# Patient Record
Sex: Female | Born: 1940 | Race: White | Hispanic: No | Marital: Married | Smoking: Former smoker
Health system: Southern US, Community
[De-identification: ages and names within clinical notes are randomized; demographics above are authoritative.]

## PROBLEM LIST (undated history)

## (undated) DIAGNOSIS — R609 Edema, unspecified: Secondary | ICD-10-CM

## (undated) DIAGNOSIS — N183 Chronic kidney disease, stage 3 unspecified: Secondary | ICD-10-CM

## (undated) DIAGNOSIS — I471 Supraventricular tachycardia, unspecified: Secondary | ICD-10-CM

## (undated) DIAGNOSIS — G4733 Obstructive sleep apnea (adult) (pediatric): Secondary | ICD-10-CM

## (undated) DIAGNOSIS — Z01810 Encounter for preprocedural cardiovascular examination: Secondary | ICD-10-CM

## (undated) DIAGNOSIS — I129 Hypertensive chronic kidney disease with stage 1 through stage 4 chronic kidney disease, or unspecified chronic kidney disease: Secondary | ICD-10-CM

## (undated) DIAGNOSIS — E669 Obesity, unspecified: Secondary | ICD-10-CM

## (undated) DIAGNOSIS — K529 Noninfective gastroenteritis and colitis, unspecified: Secondary | ICD-10-CM

## (undated) DIAGNOSIS — I10 Essential (primary) hypertension: Secondary | ICD-10-CM

## (undated) DIAGNOSIS — G4730 Sleep apnea, unspecified: Secondary | ICD-10-CM

## (undated) DIAGNOSIS — K219 Gastro-esophageal reflux disease without esophagitis: Secondary | ICD-10-CM

## (undated) DIAGNOSIS — R9430 Abnormal result of cardiovascular function study, unspecified: Secondary | ICD-10-CM

## (undated) DIAGNOSIS — E079 Disorder of thyroid, unspecified: Secondary | ICD-10-CM

## (undated) DIAGNOSIS — I519 Heart disease, unspecified: Secondary | ICD-10-CM

## (undated) HISTORY — DX: Obesity, unspecified: E66.9

## (undated) HISTORY — DX: Obstructive sleep apnea (adult) (pediatric): G47.33

## (undated) HISTORY — DX: Noninfective gastroenteritis and colitis, unspecified: K52.9

## (undated) HISTORY — DX: Heart disease, unspecified: I51.9

## (undated) HISTORY — DX: Chronic kidney disease, stage 3 (moderate): N18.3

## (undated) HISTORY — DX: Gastro-esophageal reflux disease without esophagitis: K21.9

## (undated) HISTORY — DX: Supraventricular tachycardia, unspecified: I47.10

## (undated) HISTORY — DX: Chronic kidney disease, stage 3 unspecified: N18.30

## (undated) HISTORY — DX: Encounter for preprocedural cardiovascular examination: Z01.810

## (undated) HISTORY — DX: Edema, unspecified: R60.9

## (undated) HISTORY — DX: Essential (primary) hypertension: I10

## (undated) HISTORY — DX: Sleep apnea, unspecified: G47.30

## (undated) HISTORY — DX: Hypertensive chronic kidney disease with stage 1 through stage 4 chronic kidney disease, or unspecified chronic kidney disease: I12.9

## (undated) HISTORY — DX: Abnormal result of cardiovascular function study, unspecified: R94.30

## (undated) HISTORY — DX: Supraventricular tachycardia: I47.1

---

## 1992-04-25 HISTORY — PX: CHOLECYSTECTOMY: SHX55

## 1998-02-20 ENCOUNTER — Other Ambulatory Visit: Admission: RE | Admit: 1998-02-20 | Discharge: 1998-02-20 | Payer: Self-pay | Admitting: Gastroenterology

## 1999-03-02 ENCOUNTER — Other Ambulatory Visit: Admission: RE | Admit: 1999-03-02 | Discharge: 1999-03-02 | Payer: Self-pay | Admitting: Obstetrics and Gynecology

## 2000-03-14 ENCOUNTER — Other Ambulatory Visit: Admission: RE | Admit: 2000-03-14 | Discharge: 2000-03-14 | Payer: Self-pay | Admitting: *Deleted

## 2001-02-21 ENCOUNTER — Other Ambulatory Visit
Admission: RE | Admit: 2001-02-21 | Discharge: 2001-02-21 | Payer: Self-pay | Admitting: Physical Medicine & Rehabilitation

## 2001-10-01 ENCOUNTER — Ambulatory Visit (HOSPITAL_COMMUNITY): Admission: RE | Admit: 2001-10-01 | Discharge: 2001-10-01 | Payer: Self-pay | Admitting: Gastroenterology

## 2002-02-26 ENCOUNTER — Other Ambulatory Visit: Admission: RE | Admit: 2002-02-26 | Discharge: 2002-02-26 | Payer: Self-pay | Admitting: Obstetrics and Gynecology

## 2002-04-04 ENCOUNTER — Emergency Department (HOSPITAL_COMMUNITY): Admission: EM | Admit: 2002-04-04 | Discharge: 2002-04-05 | Payer: Self-pay | Admitting: Emergency Medicine

## 2002-04-25 DIAGNOSIS — IMO0002 Reserved for concepts with insufficient information to code with codable children: Secondary | ICD-10-CM

## 2002-04-25 DIAGNOSIS — R9430 Abnormal result of cardiovascular function study, unspecified: Secondary | ICD-10-CM

## 2002-04-25 HISTORY — DX: Abnormal result of cardiovascular function study, unspecified: R94.30

## 2002-04-25 HISTORY — DX: Reserved for concepts with insufficient information to code with codable children: IMO0002

## 2003-03-06 ENCOUNTER — Other Ambulatory Visit: Admission: RE | Admit: 2003-03-06 | Discharge: 2003-03-06 | Payer: Self-pay | Admitting: Obstetrics and Gynecology

## 2003-06-04 ENCOUNTER — Encounter: Admission: RE | Admit: 2003-06-04 | Discharge: 2003-06-04 | Payer: Self-pay | Admitting: Obstetrics and Gynecology

## 2004-02-18 ENCOUNTER — Encounter: Admission: RE | Admit: 2004-02-18 | Discharge: 2004-03-17 | Payer: Self-pay | Admitting: Family Medicine

## 2004-03-24 ENCOUNTER — Other Ambulatory Visit: Admission: RE | Admit: 2004-03-24 | Discharge: 2004-03-24 | Payer: Self-pay | Admitting: Obstetrics and Gynecology

## 2004-12-13 ENCOUNTER — Ambulatory Visit: Payer: Self-pay | Admitting: Cardiology

## 2005-03-24 ENCOUNTER — Other Ambulatory Visit: Admission: RE | Admit: 2005-03-24 | Discharge: 2005-03-24 | Payer: Self-pay | Admitting: Obstetrics and Gynecology

## 2005-07-05 ENCOUNTER — Encounter: Admission: RE | Admit: 2005-07-05 | Discharge: 2005-07-05 | Payer: Self-pay | Admitting: Obstetrics and Gynecology

## 2005-10-19 ENCOUNTER — Encounter: Admission: RE | Admit: 2005-10-19 | Discharge: 2005-12-12 | Payer: Self-pay | Admitting: Obstetrics and Gynecology

## 2006-03-28 ENCOUNTER — Other Ambulatory Visit: Admission: RE | Admit: 2006-03-28 | Discharge: 2006-03-28 | Payer: Self-pay | Admitting: Obstetrics and Gynecology

## 2006-12-20 ENCOUNTER — Ambulatory Visit: Payer: Self-pay | Admitting: Cardiology

## 2007-04-03 ENCOUNTER — Other Ambulatory Visit: Admission: RE | Admit: 2007-04-03 | Discharge: 2007-04-03 | Payer: Self-pay | Admitting: Obstetrics and Gynecology

## 2007-07-12 ENCOUNTER — Encounter: Admission: RE | Admit: 2007-07-12 | Discharge: 2007-07-12 | Payer: Self-pay | Admitting: Family Medicine

## 2008-04-03 ENCOUNTER — Other Ambulatory Visit: Admission: RE | Admit: 2008-04-03 | Discharge: 2008-04-03 | Payer: Self-pay | Admitting: Obstetrics and Gynecology

## 2008-09-23 HISTORY — PX: EYE SURGERY: SHX253

## 2009-04-07 ENCOUNTER — Other Ambulatory Visit: Admission: RE | Admit: 2009-04-07 | Discharge: 2009-04-07 | Payer: Self-pay | Admitting: Obstetrics and Gynecology

## 2009-04-25 HISTORY — PX: OTHER SURGICAL HISTORY: SHX169

## 2009-08-24 ENCOUNTER — Encounter: Payer: Self-pay | Admitting: Cardiology

## 2009-08-27 ENCOUNTER — Encounter: Payer: Self-pay | Admitting: Cardiology

## 2009-08-28 ENCOUNTER — Ambulatory Visit: Payer: Self-pay | Admitting: Cardiology

## 2009-09-08 ENCOUNTER — Encounter: Payer: Self-pay | Admitting: Cardiology

## 2009-09-23 HISTORY — PX: TOTAL KNEE ARTHROPLASTY: SHX125

## 2009-10-15 ENCOUNTER — Encounter: Admission: RE | Admit: 2009-10-15 | Discharge: 2009-11-11 | Payer: Self-pay | Admitting: Sports Medicine

## 2009-11-23 HISTORY — PX: CATARACT EXTRACTION: SUR2

## 2009-12-31 ENCOUNTER — Inpatient Hospital Stay (HOSPITAL_COMMUNITY): Admission: RE | Admit: 2009-12-31 | Discharge: 2010-01-02 | Payer: Self-pay | Admitting: Neurological Surgery

## 2010-01-18 ENCOUNTER — Encounter (HOSPITAL_BASED_OUTPATIENT_CLINIC_OR_DEPARTMENT_OTHER): Admission: RE | Admit: 2010-01-18 | Discharge: 2010-03-12 | Payer: Self-pay | Admitting: Internal Medicine

## 2010-02-02 ENCOUNTER — Encounter: Admission: RE | Admit: 2010-02-02 | Discharge: 2010-02-02 | Payer: Self-pay | Admitting: Neurological Surgery

## 2010-03-25 HISTORY — PX: EYE SURGERY: SHX253

## 2010-04-27 ENCOUNTER — Encounter
Admission: RE | Admit: 2010-04-27 | Discharge: 2010-04-27 | Payer: Self-pay | Source: Home / Self Care | Attending: Neurological Surgery | Admitting: Neurological Surgery

## 2010-05-25 NOTE — Letter (Signed)
Summary: Surgical Clearance - L - Knee Replacement  Surgical Clearance - L - Knee Replacement   Imported By: Marylou Mccoy 10/15/2009 12:30:03  _____________________________________________________________________  External Attachment:    Type:   Image     Comment:   External Document

## 2010-05-25 NOTE — Assessment & Plan Note (Signed)
Summary: f2y/clearance for knee surgery/jml  Medications Added ALENDRONATE SODIUM 70 MG TABS (ALENDRONATE SODIUM) weekly VITAMIN D (ERGOCALCIFEROL) 50000 UNIT CAPS (ERGOCALCIFEROL) weekly LISINOPRIL 10 MG TABS (LISINOPRIL) Take one tablet by mouth daily DILTIAZEM HCL ER BEADS 180 MG XR24H-CAP (DILTIAZEM HCL ER BEADS) Take one capsule by mouth daily MULTIVITAMINS   TABS (MULTIPLE VITAMIN) once daily CALCIUM CARBONATE-VITAMIN D 600-400 MG-UNIT  TABS (CALCIUM CARBONATE-VITAMIN D) 2 tabs two times a day GLUCOSAMINE-CHONDROITIN   CAPS (GLUCOSAMINE-CHONDROIT-VIT C-MN) once daily OMEPRAZOLE 20 MG CPDR (OMEPRAZOLE) once daily FERROUS SULFATE 325 (65 FE) MG  TABS (FERROUS SULFATE) two times a day CELEBREX 200 MG CAPS (CELECOXIB) once daily TRAMADOL HCL 50 MG TABS (TRAMADOL HCL) 1-2 tabs every 4-6 hrs as needed      Allergies Added: NKDA  Visit Type:  cardiac clearance Referring Provider:  Alesia Morin, MD Primary Provider:  Selena Batten, MD  CC:  supraventricular tachycardia.  History of Present Illness: The patient is seen for followup of supraventricular tachycardia and for cardiac clearance for orthopedic surgery.  I saw her last in August, 2008.  There is a documented episode of supraventricular tachycardia in the past.  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  She has been on Cardizem and stable.  She has not had any recurring symptoms.  She did not have chest pain.  There is no coronary artery disease documented.  She has normal LV function by echo in the past.  Current Medications (verified): 1)  Alendronate Sodium 70 Mg Tabs (Alendronate Sodium) .... Weekly 2)  Vitamin D (Ergocalciferol) 50000 Unit Caps (Ergocalciferol) .... Weekly 3)  Lisinopril 10 Mg Tabs (Lisinopril) .... Take One Tablet By Mouth Daily 4)  Diltiazem Hcl Er Beads 180 Mg Xr24h-Cap (Diltiazem Hcl Er Beads) .... Take One Capsule By Mouth Daily 5)  Multivitamins   Tabs (Multiple Vitamin) .... Once Daily 6)  Calcium  Carbonate-Vitamin D 600-400 Mg-Unit  Tabs (Calcium Carbonate-Vitamin D) .... 2 Tabs Two Times A Day 7)  Glucosamine-Chondroitin   Caps (Glucosamine-Chondroit-Vit C-Mn) .... Once Daily 8)  Omeprazole 20 Mg Cpdr (Omeprazole) .... Once Daily 9)  Ferrous Sulfate 325 (65 Fe) Mg  Tabs (Ferrous Sulfate) .... Two Times A Day 10)  Celebrex 200 Mg Caps (Celecoxib) .... Once Daily 11)  Tramadol Hcl 50 Mg Tabs (Tramadol Hcl) .Marland Kitchen.. 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 echo... 2004  Review of Systems       Patient denies fever, chills, headache, sweats, rash, change in vision, change in hearing, chest pain, cough, nausea vomiting, urinary symptoms.  All other systems are negative.  She does have back and knee pain for which he needs surgery.  Vital Signs:  Patient profile:   70 year old female Height:      69 inches Weight:      282 pounds BMI:     41.79 Pulse rate:   70 / minute BP sitting:   136 / 74  (left arm) Cuff size:   regular  Vitals Entered By: Hardin Negus, RMA (Aug 28, 2009 1:59 PM)  Physical Exam  General:  patient is overweight but stable. Head:  head is atraumatic. Eyes:  no xanthelasma. Neck:  no jugular venous distention.  No carotid bruits. Chest Wall:  no chest  tenderness. Lungs:  lungs are clear respiratory effort is not labored. Heart:  cardiac exam reveals S1-S2.  No clicks or significant murmurs. Abdomen:  abdomen is soft. Msk:  no musculoskeletal deformities. Extremities:  no peripheral edema. Skin:  no skin rashes. Psych:  patient is oriented to person time and place.  Affect is normal.  She is here with her husband today.   Impression &  Recommendations:  Problem # 1:  HYPERTENSION (ICD-401.9)  Her updated medication list for this problem includes:    Lisinopril 10 Mg Tabs (Lisinopril) .Marland Kitchen... Take one tablet by mouth daily    Diltiazem Hcl Er Beads 180 Mg Xr24h-cap (Diltiazem hcl er beads) .Marland Kitchen... Take one capsule by mouth daily Blood pressure is controlled.  No change in therapy.  Problem # 2:  SUPRAVENTRICULAR TACHYCARDIA (ICD-427.89)  Her updated medication list for this problem includes:    Lisinopril 10 Mg Tabs (Lisinopril) .Marland Kitchen... Take one tablet by mouth daily    Diltiazem Hcl Er Beads 180 Mg Xr24h-cap (Diltiazem hcl er beads) .Marland Kitchen... Take one capsule by mouth daily There is history of supraventricular tachycardia.  The patient has not had symptoms and she's had no recurrent arrhythmias.  She continues on diltiazem.  We noted she has normal LV function.  Further cardiac testing is not needed.  EKG was not done today.  The patient has an EKG dated Aug 24, 2009.  I have a clear copy and I have reviewed it.  There is normal sinus rhythm with no significant abnormalities.  Problem # 3:  * SURGICAL CLEARANCE FOR LEFT KNEE REPLACEMENT The patient is stable from the cardiac viewpoint.  She is cleared to undergo orthopedic surgery.  No further cardiac workup is needed.  Patient Instructions: 1)  Your physician recommends that you schedule a follow-up appointment in: 2 years with Dr. Myrtis Ser 2)  Your physician recommends that you continue on your current medications as directed. Please refer to the Current Medication list given to you today.

## 2010-05-25 NOTE — Miscellaneous (Signed)
  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 stable with diltiazem GERD Hypertension Urinary incontinence Surgical clearance left knee replacement LV function  normal.... echo... 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 echo... 2004

## 2010-05-25 NOTE — Miscellaneous (Signed)
  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

## 2010-05-25 NOTE — Letter (Signed)
Summary: Varney Biles   Imported By: Roderic Ovens 09/10/2009 16:11:19  _____________________________________________________________________  External Attachment:    Type:   Image     Comment:   External Document

## 2010-06-24 HISTORY — PX: CATARACT EXTRACTION: SUR2

## 2010-07-08 LAB — SURGICAL PCR SCREEN: Staphylococcus aureus: NEGATIVE

## 2010-07-08 LAB — DIFFERENTIAL
Lymphocytes Relative: 26 % (ref 12–46)
Lymphs Abs: 2.2 10*3/uL (ref 0.7–4.0)
Monocytes Relative: 9 % (ref 3–12)
Neutro Abs: 5.2 10*3/uL (ref 1.7–7.7)
Neutrophils Relative %: 63 % (ref 43–77)

## 2010-07-08 LAB — COMPREHENSIVE METABOLIC PANEL
BUN: 10 mg/dL (ref 6–23)
Calcium: 9.6 mg/dL (ref 8.4–10.5)
Glucose, Bld: 92 mg/dL (ref 70–99)
Sodium: 138 mEq/L (ref 135–145)
Total Protein: 7.3 g/dL (ref 6.0–8.3)

## 2010-07-08 LAB — ABO/RH: ABO/RH(D): A POS

## 2010-07-08 LAB — CBC
HCT: 40.1 % (ref 36.0–46.0)
MCHC: 33.4 g/dL (ref 30.0–36.0)
MCV: 85 fL (ref 78.0–100.0)
RDW: 13.4 % (ref 11.5–15.5)

## 2010-07-08 LAB — TYPE AND SCREEN

## 2010-07-08 LAB — URINALYSIS, ROUTINE W REFLEX MICROSCOPIC
Nitrite: NEGATIVE
Specific Gravity, Urine: 1.007 (ref 1.005–1.030)
Urobilinogen, UA: 0.2 mg/dL (ref 0.0–1.0)

## 2010-07-08 LAB — PROTIME-INR: INR: 0.95 (ref 0.00–1.49)

## 2010-07-08 LAB — APTT: aPTT: 25 seconds (ref 24–37)

## 2010-09-07 NOTE — Assessment & Plan Note (Signed)
[facility]                            CARDIOLOGY OFFICE NOTE   NAME:[name], Madison LEKAS                        MRN:          [id number]  DATE:12/20/2006                            DOB:          1940/07/04    Madison [name] is doing well. In the past she had a documented episode of  rapid supraventricular tachycardia. It was most probably AV nodal  reentry. She has not had any recurrence. She is maintained on [place] and  does quite well. She has not had any significant palpitations.  Unfortunately she has continued to gain weight and we talked about  trying to lose some weight. She is careful to watch her caffeine intake  and she is careful about her medications. She is not having any chest  pain or shortness of breath.   PAST MEDICAL HISTORY:   ALLERGIES:  No known drug allergies.   MEDICATIONS:  1. Cartia 180.  2. Prilosec OTC.  3. Lisinopril.  4. Calcium.  5. Vitamins.  6. Fosamax.   OTHER MEDICAL PROBLEMS:  See the list below.   REVIEW OF SYSTEMS:  Other than some mild reflux her review of systems is  negative.   PHYSICAL EXAMINATION:  Blood pressure is 130/78 with a pulse of 70. Her  weight is 303 pounds. This has increased 23 pounds since 2 years ago  when I saw last.  Patient is oriented to person, time, and place. Affect  is normal.  HEENT: Reveals no xanthelasma. She has normal extraocular motion. There  are no carotid bruits. There is no jugular venous distension.  LUNGS: Clear. Respiratory effort is not labored.  CARDIAC EXAM: Reveals an S1 with an S2. There are no clicks or  significant murmurs.  ABDOMEN: Obese but soft. She has normal bowel sounds.  There is no peripheral edema.   EKG: Normal.   PROBLEMS:  Include:  1. History of cholecystectomy.  2. Gastroesophageal reflux disease.  3. Hypertension, treated.  4. History of some urinary incontinence.  5. Episode of documented rapid supraventricular tachycardia in the  remote past, thought most likely to be AV nodal reentry      tachycardia. She has had no significant recurrence. She is stable.      I would keep her on Cartia. She is quite stable. She asked me about      the recommendation to use 81 mg of aspirin daily. In her case I      feel that this is a very reasonable recommendation.   The patient is stable and she does not need formal cardiology follow up.  I will ask that Dr. Corliss Blacker and her team write her ongoing cardiac  medicines but I will be happy to see her anytime.     Luis Abed, MD, [facility]  Electronically Signed    JDK/MedQ  DD: 12/20/2006  DT: 12/20/2006  Job #: [id number]   cc:   Pam Drown, M.D.

## 2010-09-10 NOTE — Procedures (Signed)
[place]  Patient:    Madison [name], Madison [name] Visit Number: [id number] MRN: [id number]          Service Type: END Location: ENDO Attending Physician:  Rich Brave Dictated by:   Florencia Reasons, M.D. Proc. Date: 10/01/01 Admit Date:  10/01/2001   CC:         Charles A. Tenny Craw, M.D.   Procedure Report  PROCEDURE:  Colonoscopy.  SURGEON:  Florencia Reasons, M.D.  INDICATIONS:  A 70 year old female with left lower quadrant pain (which has resolved on Aciphex) and diarrhea several times a day, especially after meals, every since her gallbladder surgery several years ago.  FINDINGS:  Sigmoid diverticulosis.  DESCRIPTION OF PROCEDURE:  The nature, purpose, and risks of the procedure had been discussed with the patient who provided written consent.  Sedation is fentanyl 100 mcg and Versed 10 mg IV without arrhythmias or desaturation.  The Olympus adult video colonoscope was easily advanced to the cecum as identified by visualization of the appendiceal orifice and pull back was then performed. The quality of the prep was very good with just a little bit of stool film in the proximal colon, fairly well-removed by irrigation.  There was some sigmoid diverticulosis, mild to moderate in amount, but this was otherwise normal exam without evidence of polyps, cancer, colitis, or vascular malformations. Retroflexion in the rectum showed a hypertrophied anal papilla.  Reinspection of the rectosigmoid was unremarkable.  No biopsies were obtained (random rectosigmoid biopsies on a flexible sigmoidoscopy four years ago were normal).  The patient tolerated the procedure well and there were no apparent complications.  IMPRESSION: 1. History of left lower quadrant pain, now resolved on proton pump inhibitor    therapy.  Exact nature of pain unclear. 2. Sigmoid diverticulosis, felt not likely to be related to the patients    pain, given the character of  the pain, and its resolution on proton pump    inhibitor therapy. 3. Diarrhea after cholecystectomy.  PLAN:  Trial of Questran.  If the diarrhea persist, we could consider repeat random mucosal biopsies to look for microscopic colitis. Dictated by:   Florencia Reasons, M.D. Attending Physician:  Rich Brave DD:  10/01/01 TD:  10/02/01 Job: [id number]

## 2010-09-24 HISTORY — PX: OTHER SURGICAL HISTORY: SHX169

## 2010-11-24 DIAGNOSIS — Z01810 Encounter for preprocedural cardiovascular examination: Secondary | ICD-10-CM

## 2010-11-24 HISTORY — DX: Encounter for preprocedural cardiovascular examination: Z01.810

## 2010-11-24 HISTORY — PX: ELBOW SURGERY: SHX618

## 2010-12-03 ENCOUNTER — Encounter: Payer: Self-pay | Admitting: Cardiology

## 2010-12-03 ENCOUNTER — Encounter: Payer: Self-pay | Admitting: *Deleted

## 2010-12-06 ENCOUNTER — Encounter: Payer: Self-pay | Admitting: Cardiology

## 2010-12-06 DIAGNOSIS — I10 Essential (primary) hypertension: Secondary | ICD-10-CM | POA: Insufficient documentation

## 2010-12-06 DIAGNOSIS — R0989 Other specified symptoms and signs involving the circulatory and respiratory systems: Secondary | ICD-10-CM | POA: Insufficient documentation

## 2010-12-06 DIAGNOSIS — R32 Unspecified urinary incontinence: Secondary | ICD-10-CM | POA: Insufficient documentation

## 2010-12-06 DIAGNOSIS — K219 Gastro-esophageal reflux disease without esophagitis: Secondary | ICD-10-CM | POA: Insufficient documentation

## 2010-12-06 DIAGNOSIS — I471 Supraventricular tachycardia: Secondary | ICD-10-CM | POA: Insufficient documentation

## 2010-12-06 DIAGNOSIS — E669 Obesity, unspecified: Secondary | ICD-10-CM | POA: Insufficient documentation

## 2010-12-06 DIAGNOSIS — N183 Chronic kidney disease, stage 3 (moderate): Secondary | ICD-10-CM

## 2010-12-06 DIAGNOSIS — I129 Hypertensive chronic kidney disease with stage 1 through stage 4 chronic kidney disease, or unspecified chronic kidney disease: Secondary | ICD-10-CM

## 2010-12-07 ENCOUNTER — Ambulatory Visit (INDEPENDENT_AMBULATORY_CARE_PROVIDER_SITE_OTHER): Payer: Medicare Other | Admitting: Cardiology

## 2010-12-07 ENCOUNTER — Encounter: Payer: Self-pay | Admitting: Cardiology

## 2010-12-07 VITALS — BP 129/67 | HR 78 | Ht 68.0 in | Wt 270.0 lb

## 2010-12-07 DIAGNOSIS — I10 Essential (primary) hypertension: Secondary | ICD-10-CM

## 2010-12-07 DIAGNOSIS — Z01810 Encounter for preprocedural cardiovascular examination: Secondary | ICD-10-CM

## 2010-12-07 DIAGNOSIS — I471 Supraventricular tachycardia, unspecified: Secondary | ICD-10-CM

## 2010-12-07 DIAGNOSIS — I498 Other specified cardiac arrhythmias: Secondary | ICD-10-CM

## 2010-12-07 NOTE — Assessment & Plan Note (Signed)
The patient's cardiovascular system is stable.  There is no need for further cardiac workup.  She is cleared for knee surgery.

## 2010-12-07 NOTE — Patient Instructions (Signed)
Your physician recommends that you schedule a follow-up appointment in: 24 months

## 2010-12-07 NOTE — Assessment & Plan Note (Signed)
Blood pressure is controlled. No change in therapy. 

## 2010-12-07 NOTE — Progress Notes (Signed)
HPI Patient is seen today for her cardiac clearance for knee surgery.  There is no history of coronary disease.  There is history of supraventricular tachycardia in the past.  She had knee surgery after I saw her in May of 2011.  This went well.  She now needs surgery on the other knee.  We know that she has normal left ventricular function.  She is active although Limited by her knee. She has no chest pain.  There is no shortness of breath.  She has not had any significant palpitations.  As part of today's evaluation I have reviewed the old medical record and I have updated the current electronic medical record completely. Not on File  Current Outpatient Prescriptions  Medication Sig Dispense Refill  . Calcium Carbonate (CALCIUM 600 PO) Take by mouth daily.       . ferrous gluconate (FERGON) 246 (28 FE) MG tablet Take 65 mg by mouth daily with breakfast.        . GLUCOSAMINE HCL PO Take by mouth.        Marland Kitchen lisinopril (PRINIVIL,ZESTRIL) 10 MG tablet Take 10 mg by mouth daily.        . Multiple Vitamin (MULTIVITAMIN) tablet Take 1 tablet by mouth daily.        Marland Kitchen omeprazole (PRILOSEC) 20 MG capsule Take 20 mg by mouth daily.        . Sulfamethoxazole-Trimethoprim (SULFAMETHOXAZOLE-TMP DS PO) Take by mouth 2 (two) times daily.        Marland Kitchen VITAMIN D, ERGOCALCIFEROL, PO Take by mouth daily.          History   Social History  . Marital Status: Married    Spouse Name: N/A    Number of Children: N/A  . Years of Education: N/A   Occupational History  . Not on file.   Social History Main Topics  . Smoking status: Never Smoker   . Smokeless tobacco: Not on file  . Alcohol Use: Not on file  . Drug Use: Not on file  . Sexually Active: Not on file   Other Topics Concern  . Not on file   Social History Narrative  . No narrative on file    No family history on file.  Past Medical History  Diagnosis Date  . Hypertension   . GERD (gastroesophageal reflux disease)   . Obesity   . SVT  (supraventricular tachycardia)     Documented episode in the past, possibly reentrant tachycardia  . Ejection fraction     Normal, echo, 2004  . Urinary incontinence   . Preop cardiovascular exam     Cardiac clearance for knee surgery August, 201 to    Past Surgical History  Procedure Date  . Cholecystectomy     ROS  Patient denies fever, chills, headache, sweats, rash, change in vision, change in hearing, chest pain, cough, nausea vomiting, urinary symptoms.  All other systems are reviewed and are negative.  PHYSICAL EXAM Patient is overweight but stable.  Head is atraumatic.  There is no xanthelasma.  Lungs are clear.  Respiratory effort is nonlabored.  There is no jugular venous distention.  There are no carotid bruits.  Lungs are clear.  Respiratory effort is nonlabored.  Cardiac exam reveals an S1 and S2.  There is no clicks or significant murmurs.  The abdomen is soft.  There is no peripheral edema.  There are no musculoskeletal deformities.  No skin rashes. Filed Vitals:   12/07/10 1026  BP: 129/67  Pulse: 78  Height: 5\' 8"  (1.727 m)  Weight: 270 lb (122.471 kg)    EKG Is done today and reviewed by me.  It is normal.  ASSESSMENT & PLAN

## 2010-12-07 NOTE — Assessment & Plan Note (Signed)
The patient has not had any recurrent symptomatic arrhythmias.  No further workup.

## 2010-12-08 ENCOUNTER — Telehealth: Payer: Self-pay | Admitting: Cardiology

## 2010-12-08 NOTE — Telephone Encounter (Signed)
Faxed EKG to Maralyn Sago at [facility] ([phone]).

## 2011-05-19 ENCOUNTER — Other Ambulatory Visit (HOSPITAL_COMMUNITY)
Admission: RE | Admit: 2011-05-19 | Discharge: 2011-05-19 | Disposition: A | Discharge status: Home / Self Care | Payer: Medicare Other | Source: Ambulatory Visit | Attending: Obstetrics and Gynecology | Admitting: Obstetrics and Gynecology

## 2011-05-19 ENCOUNTER — Other Ambulatory Visit: Payer: Self-pay | Admitting: Nurse Practitioner

## 2011-05-19 DIAGNOSIS — Z01419 Encounter for gynecological examination (general) (routine) without abnormal findings: Secondary | ICD-10-CM | POA: Insufficient documentation

## 2011-05-19 DIAGNOSIS — N760 Acute vaginitis: Secondary | ICD-10-CM | POA: Insufficient documentation

## 2011-05-27 ENCOUNTER — Encounter: Payer: Self-pay | Admitting: Cardiology

## 2011-05-27 ENCOUNTER — Ambulatory Visit (INDEPENDENT_AMBULATORY_CARE_PROVIDER_SITE_OTHER): Payer: Medicare Other | Admitting: Cardiology

## 2011-05-27 DIAGNOSIS — I471 Supraventricular tachycardia: Secondary | ICD-10-CM

## 2011-05-27 DIAGNOSIS — I498 Other specified cardiac arrhythmias: Secondary | ICD-10-CM

## 2011-05-27 DIAGNOSIS — Z01810 Encounter for preprocedural cardiovascular examination: Secondary | ICD-10-CM

## 2011-05-27 DIAGNOSIS — I10 Essential (primary) hypertension: Secondary | ICD-10-CM

## 2011-05-27 NOTE — Assessment & Plan Note (Signed)
The patient's cardiac status is stable. We know from 2004 that she had normal LV function. She's had no EKG changes and no cardiac symptoms. There is no reason to repeat an echo at this time. There is no reason to proceed with exercise testing at this time. The patient is stable. She is cleared for her knee surgery.

## 2011-05-27 NOTE — Patient Instructions (Signed)
Your physician recommends that you schedule a follow-up appointment in: No follow up needed, you are cleared for surgery from a cardiac standpoint.

## 2011-05-27 NOTE — Assessment & Plan Note (Signed)
Blood pressure today is stable. No further workup is needed.

## 2011-05-27 NOTE — Assessment & Plan Note (Signed)
The patient did have a documented episode of supraventricular tachycardia in the past. We felt that it was probably reentrant. She does not have palpitations. No further workup is needed.

## 2011-05-27 NOTE — Progress Notes (Signed)
HPI Patient is seen today for preop clearance for knee surgery. I have followed the patient over the years. There is no history of coronary disease. She had an episode of supraventricular tachycardia in the past that was felt to be probably reentrant. She has not had any marked recurrences. She had normal LV function in the past. She has not had any definite cardiac events.  The patient has had no chest pain or shortness of breath. There is no evidence of a recent MI or arrhythmias. She's had no congestive heart failure. She is active despite her knee problems.  No Known Allergies  Current Outpatient Prescriptions  Medication Sig Dispense Refill  . Calcium Carbonate (CALCIUM 600 PO) Take by mouth daily.       . Cholecalciferol (VITAMIN D) 2000 UNITS tablet Take 2,000 Units by mouth daily.      Marland Kitchen diltiazem (CARDIZEM CD) 180 MG 24 hr capsule Take 180 mg by mouth daily.      . fluocinonide cream (LIDEX) 0.05 % Apply 1 application topically as needed.      Marland Kitchen GLUCOSAMINE HCL PO Take by mouth.        Marland Kitchen lisinopril (PRINIVIL,ZESTRIL) 10 MG tablet Take 10 mg by mouth daily.        . Multiple Vitamin (MULTIVITAMIN) tablet Take 1 tablet by mouth daily.        Marland Kitchen omeprazole (PRILOSEC) 20 MG capsule Take 20 mg by mouth daily.          History   Social History  . Marital Status: Married    Spouse Name: N/A    Number of Children: N/A  . Years of Education: N/A   Occupational History  . Not on file.   Social History Main Topics  . Smoking status: Never Smoker   . Smokeless tobacco: Not on file  . Alcohol Use: Not on file  . Drug Use: Not on file  . Sexually Active: Not on file   Other Topics Concern  . Not on file   Social History Narrative  . No narrative on file    No family history on file.  Past Medical History  Diagnosis Date  . Hypertension   . GERD (gastroesophageal reflux disease)   . Obesity   . SVT (supraventricular tachycardia)     Documented episode in the past,  possibly reentrant tachycardia  . Ejection fraction     Normal, echo, 2004  . Urinary incontinence   . Preop cardiovascular exam     Cardiac clearance for knee surgery  August, 2012    Past Surgical History  Procedure Date  . Cholecystectomy 1994  . 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 Cataract extraction 8/11    left eye  . Lumbar infusion   . Eye surgery 12/11    macular hole -- right eye  . Cataract extraction 3/12    right eye  . Copsilotomy laser treatment 6/12    left eye  . Elbow surgery 8/12    ROS  Patient denies fever, chills, headache, sweats, rash, change in vision, change in hearing, chest pain, cough, nausea vomiting, urinary symptoms. All of the systems are reviewed and are negative other than the discomfort from her knee.  PHYSICAL EXAM Patient is overweight but stable. There is no jugular venous distention. Lungs are clear. Respiratory effort is nonlabored. Cardiac exam reveals S1 and S2. There no clicks or  significant murmurs. The abdomen is soft. Is no peripheral edema. There are no skin rashes. There is no musculoskeletal deformity. Filed Vitals:   05/27/11 1047  BP: 136/72  Pulse: 68  Height: 5\' 8"  (1.727 m)  Weight: 271 lb (122.925 kg)    EKG EKG is done and reviewed by me. It is completely normal. There is no change from the past. ASSESSMENT & PLAN

## 2011-06-13 HISTORY — PX: TOTAL KNEE ARTHROPLASTY: SHX125

## 2012-06-20 ENCOUNTER — Other Ambulatory Visit: Payer: Self-pay | Admitting: Gastroenterology

## 2013-08-30 ENCOUNTER — Other Ambulatory Visit (HOSPITAL_COMMUNITY)
Admission: RE | Admit: 2013-08-30 | Discharge: 2013-08-30 | Disposition: A | Discharge status: Home / Self Care | Payer: Medicare Other | Source: Ambulatory Visit | Attending: Family Medicine | Admitting: Family Medicine

## 2013-08-30 ENCOUNTER — Other Ambulatory Visit: Payer: Self-pay | Admitting: Family Medicine

## 2013-08-30 DIAGNOSIS — Z0000 Encounter for general adult medical examination without abnormal findings: Secondary | ICD-10-CM | POA: Insufficient documentation

## 2013-08-30 DIAGNOSIS — R87619 Unspecified abnormal cytological findings in specimens from cervix uteri: Secondary | ICD-10-CM | POA: Insufficient documentation

## 2013-08-30 DIAGNOSIS — Z1151 Encounter for screening for human papillomavirus (HPV): Secondary | ICD-10-CM | POA: Insufficient documentation

## 2014-03-10 DIAGNOSIS — G4730 Sleep apnea, unspecified: Secondary | ICD-10-CM

## 2014-03-10 DIAGNOSIS — I519 Heart disease, unspecified: Secondary | ICD-10-CM | POA: Insufficient documentation

## 2014-03-10 HISTORY — DX: Heart disease, unspecified: I51.9

## 2014-03-10 HISTORY — DX: Sleep apnea, unspecified: G47.30

## 2014-07-31 ENCOUNTER — Encounter (HOSPITAL_BASED_OUTPATIENT_CLINIC_OR_DEPARTMENT_OTHER): Payer: Self-pay

## 2014-07-31 ENCOUNTER — Emergency Department (HOSPITAL_BASED_OUTPATIENT_CLINIC_OR_DEPARTMENT_OTHER): Payer: Medicare Other

## 2014-07-31 ENCOUNTER — Emergency Department (HOSPITAL_BASED_OUTPATIENT_CLINIC_OR_DEPARTMENT_OTHER)
Admission: EM | Admit: 2014-07-31 | Discharge: 2014-07-31 | Disposition: A | Discharge status: Home / Self Care | Payer: Medicare Other | Attending: Emergency Medicine | Admitting: Emergency Medicine

## 2014-07-31 DIAGNOSIS — I10 Essential (primary) hypertension: Secondary | ICD-10-CM | POA: Insufficient documentation

## 2014-07-31 DIAGNOSIS — M549 Dorsalgia, unspecified: Secondary | ICD-10-CM

## 2014-07-31 DIAGNOSIS — K219 Gastro-esophageal reflux disease without esophagitis: Secondary | ICD-10-CM | POA: Insufficient documentation

## 2014-07-31 DIAGNOSIS — Z79899 Other long term (current) drug therapy: Secondary | ICD-10-CM | POA: Insufficient documentation

## 2014-07-31 DIAGNOSIS — M62830 Muscle spasm of back: Secondary | ICD-10-CM | POA: Insufficient documentation

## 2014-07-31 DIAGNOSIS — M546 Pain in thoracic spine: Secondary | ICD-10-CM | POA: Diagnosis present

## 2014-07-31 DIAGNOSIS — Z87891 Personal history of nicotine dependence: Secondary | ICD-10-CM | POA: Diagnosis not present

## 2014-07-31 DIAGNOSIS — E669 Obesity, unspecified: Secondary | ICD-10-CM | POA: Insufficient documentation

## 2014-07-31 MED ORDER — OXYCODONE-ACETAMINOPHEN 5-325 MG PO TABS: 1.0000 | ORAL_TABLET | Freq: Four times a day (QID) | ORAL | Status: DC | PRN

## 2014-07-31 MED ORDER — OXYCODONE-ACETAMINOPHEN 5-325 MG PO TABS
1.0000 | ORAL_TABLET | Freq: Once | ORAL | Status: AC
  Administered 2014-07-31: 1 via ORAL
  Filled 2014-07-31: qty 1

## 2014-07-31 MED ORDER — DIAZEPAM 5 MG/ML IJ SOLN
10.0000 mg | Freq: Once | INTRAMUSCULAR | Status: AC
  Administered 2014-07-31: 10 mg via INTRAVENOUS
  Filled 2014-07-31: qty 2

## 2014-07-31 MED ORDER — DIAZEPAM 10 MG PO TABS: 10.0000 mg | ORAL_TABLET | Freq: Three times a day (TID) | ORAL | Status: DC | PRN

## 2014-07-31 NOTE — ED Notes (Signed)
Patient and husband states the patient developed back spasms about 10 days ago. No injury noted. Has history of back surgery.  Has attempted to see her PCP and Neurosurgeon and can not get in to see them until 08/13/14.  States two days ago she was seen at [facility] and given injections and predinsone with no relief.  Last night pain worsened.

## 2014-07-31 NOTE — ED Provider Notes (Addendum)
CSN: [id number]     Arrival date & time 07/31/14  7253 History   First MD Initiated Contact with Patient 07/31/14 [phone]     Chief Complaint  Patient presents with  . Back Pain     (Consider location/radiation/quality/duration/timing/severity/associated sxs/prior Treatment) Patient is a 74 y.o. female presenting with back pain. The history is provided by the patient.  Back Pain Location:  Thoracic spine Quality:  Cramping Radiates to:  Does not radiate Pain severity:  Severe Pain is:  Worse during the night Onset quality:  Gradual Duration:  3 weeks Timing:  Constant Progression:  Worsening Chronicity:  New Context comment:  States it just started hurting about easter time and not getting any better Relieved by:  Nothing Worsened by:  Bending, twisting, standing and movement Ineffective treatments: prednisone, flexeril and toradol injection at PCP office. Associated symptoms: no abdominal pain, no bladder incontinence, no bowel incontinence, no chest pain, no dysuria, no leg pain, no numbness, no paresthesias and no weakness   Risk factors: no recent surgery   Risk factors comment:  Prior hx of lumbar fusion in 2012   Past Medical History  Diagnosis Date  . Hypertension   . GERD (gastroesophageal reflux disease)   . Obesity   . SVT (supraventricular tachycardia)     Documented episode in the past, possibly reentrant tachycardia  . Ejection fraction     Normal, echo, 2004  . Urinary incontinence   . Preop cardiovascular exam     Cardiac clearance for knee surgery  August, 2012   Past Surgical History  Procedure Laterality Date  . Cholecystectomy  1994  . Eye surgery  09/2008    macular hole ---left eye  . 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 Cataract extraction  8/11    left eye  . Lumbar infusion    . Eye surgery  12/11    macular hole -- right eye  . Cataract extraction  3/12    right eye  . Copsilotomy laser treatment  6/12    left eye  . Elbow surgery   8/12   No family history on file. History  Substance Use Topics  . Smoking status: Former Games developer  . Smokeless tobacco: Not on file  . Alcohol Use: No   OB History    No data available     Review of Systems  Cardiovascular: Negative for chest pain.  Gastrointestinal: Negative for abdominal pain and bowel incontinence.  Genitourinary: Negative for bladder incontinence and dysuria.  Musculoskeletal: Positive for back pain.  Neurological: Negative for weakness, numbness and paresthesias.  All other systems reviewed and are negative.     Allergies  Review of patient's allergies indicates no known allergies.  Home Medications   Prior to Admission medications   Medication Sig Start Date End Date Taking? Authorizing Provider  Calcium Carbonate (CALCIUM 600 PO) Take by mouth daily.    Yes Historical Provider, MD  Cholecalciferol (VITAMIN D) 2000 UNITS tablet Take 2,000 Units by mouth daily.   Yes Historical Provider, MD  cyclobenzaprine (FLEXERIL) 10 MG tablet Take 10 mg by mouth at bedtime.   Yes Historical Provider, MD  diltiazem (CARDIZEM CD) 180 MG 24 hr capsule Take 180 mg by mouth daily.   Yes Historical Provider, MD  fluocinonide cream (LIDEX) 0.05 % Apply 1 application topically as needed.   Yes Historical Provider, MD  lisinopril (PRINIVIL,ZESTRIL) 10 MG tablet Take 10 mg by mouth daily.     Yes  Historical Provider, MD  Multiple Vitamin (MULTIVITAMIN) tablet Take 1 tablet by mouth daily.     Yes Historical Provider, MD  omeprazole (PRILOSEC) 20 MG capsule Take 20 mg by mouth daily.     Yes Historical Provider, MD  predniSONE (STERAPRED UNI-PAK) 10 MG tablet Take by mouth daily.   Yes Historical Provider, MD  GLUCOSAMINE HCL PO Take by mouth.      Historical Provider, MD   BP 153/54 mmHg  Pulse 82  Temp(Src) 98.7 F (37.1 C) (Oral)  Resp 18  Ht 5\' 8"  (1.727 m)  Wt 280 lb (127.007 kg)  BMI 42.58 kg/m2 Physical Exam  Constitutional: She is oriented to person, place,  and time. She appears well-developed and well-nourished. No distress.  HENT:  Head: Normocephalic and atraumatic.  Mouth/Throat: Oropharynx is clear and moist.  Eyes: Conjunctivae and EOM are normal. Pupils are equal, round, and reactive to light.  Neck: Normal range of motion. Neck supple.  Cardiovascular: Normal rate, regular rhythm and intact distal pulses.   No murmur heard. Pulmonary/Chest: Effort normal and breath sounds normal. No respiratory distress. She has no wheezes. She has no rales.  Abdominal: Soft. She exhibits no distension. There is no tenderness. There is no rebound and no guarding.  Musculoskeletal: Normal range of motion. She exhibits tenderness. She exhibits no edema.       Thoracic back: She exhibits tenderness, bony tenderness, pain and spasm. She exhibits normal pulse.       Back:  Neurological: She is alert and oriented to person, place, and time. She has normal strength. No sensory deficit.  Able to ambulate but walks hunched over  Skin: Skin is warm and dry. No rash noted. No erythema.  Psychiatric: She has a normal mood and affect. Her behavior is normal.  Nursing note and vitals reviewed.   ED Course  Procedures (including critical care time) Labs Review Labs Reviewed - No data to display  Imaging Review Dg Thoracic Spine W/swimmers  07/31/2014   CLINICAL DATA:  Mid thoracic back pain for 12 days without reported injury. Initial encounter.  EXAM: THORACIC SPINE - 2 VIEW + SWIMMERS  COMPARISON:  None.  FINDINGS: There is no evidence of thoracic spine fracture. Alignment is normal. Anterior osteophyte formation is noted in the lower thoracic spine.  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 No fracture or spondylolisthesis is noted.   Electronically Signed   By: 09/30/2014, M.D.   On: 07/31/2014 09:20     EKG Interpretation None      MDM   Final diagnoses:  Back pain  Back spasm    Patient with a  history of lumbar fusion who presents today with a three-week history of worsening back pain. She was seen at the [facility] walk-in clinic 2 days ago and at that time had steroid injections of her hips and a Toradol shot. Over the next 2 days she has been taking Flexeril and prednisone without any improvement in the pain. She is now having Significant back spasms as well as thoracic tenderness. She does not note any recent trauma. It other than the injections she received at the walk-in clinic after the pain had started she has had no other spinal infusions her recent surgery. She denies fever or weakness. However that back spasms and pain are becoming so bad she is unable to get comfortable. She has an appointment with Dr. [facility] on 08/13/2014 when they attempted to contact the  office today date recommended she come to the emergency room.  Patient had L spine imaging done at the walk-in clinic that showed the hardware was appropriately place but no specific new pathology. She's having significant thoracic tenderness today so we'll also get a T-spine image.  3:11 PM Significant improvement after Valium and Percocet. T spine imaging with arthritis but no compression fracture. Feel that patient needs an urgent MRI but not emergent. Scheduled MRI of the T and L-spine to be done on Saturday and following up with Dr. Yetta Barre after that. Patient given pain management and medication for muscle spasm. Low suspicion for epidural abscess at this time, AAA or cord compression.  Gwyneth Sprout, MD 07/31/14 1512  Gwyneth Sprout, MD 07/31/14 1513

## 2014-08-02 ENCOUNTER — Ambulatory Visit (HOSPITAL_BASED_OUTPATIENT_CLINIC_OR_DEPARTMENT_OTHER): Payer: Medicare Other

## 2014-08-02 ENCOUNTER — Ambulatory Visit (HOSPITAL_BASED_OUTPATIENT_CLINIC_OR_DEPARTMENT_OTHER): Admit: 2014-08-02 | Payer: Medicare Other

## 2014-08-09 ENCOUNTER — Ambulatory Visit (HOSPITAL_BASED_OUTPATIENT_CLINIC_OR_DEPARTMENT_OTHER)
Admission: RE | Admit: 2014-08-09 | Discharge: 2014-08-09 | Disposition: A | Discharge status: Home / Self Care | Payer: Medicare Other | Source: Ambulatory Visit | Attending: Emergency Medicine | Admitting: Emergency Medicine

## 2014-08-09 DIAGNOSIS — M4805 Spinal stenosis, thoracolumbar region: Secondary | ICD-10-CM | POA: Diagnosis not present

## 2014-08-09 DIAGNOSIS — M546 Pain in thoracic spine: Secondary | ICD-10-CM | POA: Insufficient documentation

## 2014-08-09 DIAGNOSIS — M4316 Spondylolisthesis, lumbar region: Secondary | ICD-10-CM | POA: Diagnosis not present

## 2014-08-09 DIAGNOSIS — M545 Low back pain: Secondary | ICD-10-CM | POA: Diagnosis present

## 2014-08-09 DIAGNOSIS — M8448XA Pathological fracture, other site, initial encounter for fracture: Secondary | ICD-10-CM | POA: Diagnosis not present

## 2014-08-18 ENCOUNTER — Other Ambulatory Visit: Payer: Self-pay | Admitting: Neurological Surgery

## 2014-08-18 ENCOUNTER — Telehealth: Payer: Self-pay | Admitting: Cardiology

## 2014-08-18 DIAGNOSIS — S32030D Wedge compression fracture of third lumbar vertebra, subsequent encounter for fracture with routine healing: Secondary | ICD-10-CM

## 2014-08-18 NOTE — Telephone Encounter (Signed)
I will see the patient when you can find a reasonable time in the schedule.

## 2014-08-18 NOTE — Telephone Encounter (Signed)
**Note De-identified  Obfuscation** Please advise 

## 2014-08-18 NOTE — Telephone Encounter (Signed)
**Note De-Identified  Obfuscation** The pt is aware (per Madison [name]) that we have scheduled her to see Dr Myrtis Ser on 11/11/14 @ 9:30.

## 2014-08-18 NOTE — Telephone Encounter (Signed)
Follow Up  Pt husband called. Pt was last seen by Dr. Myrtis Ser 05/2011. Would like to see Dr. Myrtis Ser and Dr, Myrtis Ser only. Will you re-establish this pt. Per Larita Fife send this in chart as a message//sr

## 2014-09-11 ENCOUNTER — Other Ambulatory Visit (HOSPITAL_COMMUNITY)
Admission: RE | Admit: 2014-09-11 | Discharge: 2014-09-11 | Disposition: A | Discharge status: Home / Self Care | Payer: Medicare Other | Source: Ambulatory Visit | Attending: Family Medicine | Admitting: Family Medicine

## 2014-09-11 ENCOUNTER — Other Ambulatory Visit: Payer: Self-pay | Admitting: Family Medicine

## 2014-09-11 DIAGNOSIS — Z124 Encounter for screening for malignant neoplasm of cervix: Secondary | ICD-10-CM | POA: Insufficient documentation

## 2014-09-11 DIAGNOSIS — Z1151 Encounter for screening for human papillomavirus (HPV): Secondary | ICD-10-CM | POA: Diagnosis present

## 2014-09-15 LAB — CYTOLOGY - PAP

## 2014-09-17 ENCOUNTER — Ambulatory Visit (INDEPENDENT_AMBULATORY_CARE_PROVIDER_SITE_OTHER): Payer: Medicare Other

## 2014-09-17 DIAGNOSIS — M859 Disorder of bone density and structure, unspecified: Secondary | ICD-10-CM | POA: Diagnosis not present

## 2014-09-17 DIAGNOSIS — S32030D Wedge compression fracture of third lumbar vertebra, subsequent encounter for fracture with routine healing: Secondary | ICD-10-CM

## 2014-09-23 ENCOUNTER — Ambulatory Visit (INDEPENDENT_AMBULATORY_CARE_PROVIDER_SITE_OTHER): Payer: Medicare Other

## 2014-09-23 ENCOUNTER — Other Ambulatory Visit: Payer: Self-pay | Admitting: Neurological Surgery

## 2014-09-23 DIAGNOSIS — M438X6 Other specified deforming dorsopathies, lumbar region: Secondary | ICD-10-CM | POA: Diagnosis not present

## 2014-09-23 DIAGNOSIS — Z9889 Other specified postprocedural states: Secondary | ICD-10-CM | POA: Diagnosis not present

## 2014-09-23 DIAGNOSIS — S32030D Wedge compression fracture of third lumbar vertebra, subsequent encounter for fracture with routine healing: Secondary | ICD-10-CM

## 2014-11-11 ENCOUNTER — Ambulatory Visit (INDEPENDENT_AMBULATORY_CARE_PROVIDER_SITE_OTHER): Payer: Medicare Other | Admitting: Cardiology

## 2014-11-11 ENCOUNTER — Encounter: Payer: Self-pay | Admitting: Cardiology

## 2014-11-11 VITALS — BP 122/78 | HR 73 | Ht 66.0 in | Wt 290.4 lb

## 2014-11-11 DIAGNOSIS — I471 Supraventricular tachycardia: Secondary | ICD-10-CM

## 2014-11-11 DIAGNOSIS — R600 Localized edema: Secondary | ICD-10-CM | POA: Insufficient documentation

## 2014-11-11 DIAGNOSIS — R0989 Other specified symptoms and signs involving the circulatory and respiratory systems: Secondary | ICD-10-CM | POA: Diagnosis not present

## 2014-11-11 DIAGNOSIS — R9430 Abnormal result of cardiovascular function study, unspecified: Secondary | ICD-10-CM

## 2014-11-11 DIAGNOSIS — R0600 Dyspnea, unspecified: Secondary | ICD-10-CM | POA: Diagnosis not present

## 2014-11-11 DIAGNOSIS — I10 Essential (primary) hypertension: Secondary | ICD-10-CM | POA: Diagnosis not present

## 2014-11-11 NOTE — Patient Instructions (Signed)
Medication Instructions:  Same-no change  Labwork: None  Testing/Procedures: Your physician has requested that you have an echocardiogram. Echocardiography is a painless test that uses sound waves to create images of your heart. It provides your doctor with information about the size and shape of your heart and how well your heart's chambers and valves are working. This procedure takes approximately one hour. There are no restrictions for this procedure.   Follow-Up: Your physician recommends that you schedule a follow-up appointment in: September, 2016   Any Other Special Instructions Will Be Listed Below (If Applicable). Dr Myrtis Ser requests that your decrease your salt and fluid intake and elevate your feet often.

## 2014-11-11 NOTE — Assessment & Plan Note (Signed)
Blood pressures controlled. No change in therapy. 

## 2014-11-11 NOTE — Assessment & Plan Note (Signed)
The patient has had some edema in the hot weather in the summer in the past. It is worse this summer. Most of the days it decreases after she has been in bed at night. I've encouraged her to be careful with her salt and fluid intake. I've encouraged her to keep her legs elevated when she can when she is not ambulating. We will proceed with 2-D echo to reassess LV function to be sure that she has not had a significant change. I will then see her back for follow-up.

## 2014-11-11 NOTE — Assessment & Plan Note (Signed)
She is not having any significant recurrent palpitations.

## 2014-11-11 NOTE — Progress Notes (Signed)
Cardiology Office Note   Date:  11/11/2014   ID:  Madison [name], DOB 1940-11-18, MRN [id number]  PCP:  [name], MD  Cardiologist:  Willa Rough, MD   Chief Complaint  Patient presents with  . Appointment    follow-up history of supraventricular tachycardia      History of Present Illness: Madison [name] is a 74 y.o. female who presents today to follow-up for history of supraventricular tachycardia. I saw her last in 2013. She's not having any significant palpitations. She does have some shortness of breath. She also has some edema. Usually her edema is gone in the morning. In the past left ventricular function was normal.    Past Medical History  Diagnosis Date  . Hypertension   . GERD (gastroesophageal reflux disease)   . Obesity   . SVT (supraventricular tachycardia)     Documented episode in the past, possibly reentrant tachycardia  . Ejection fraction     Normal, echo, 2004  . Urinary incontinence   . Preop cardiovascular exam     Cardiac clearance for knee surgery  August, 2012    Past Surgical History  Procedure Laterality Date  . Cholecystectomy  1994  . Eye surgery  09/2008    macular hole ---left eye  . 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 Cataract extraction  8/11    left eye  . Lumbar infusion    . Eye surgery  12/11    macular hole -- right eye  . Cataract extraction  3/12    right eye  . Copsilotomy laser treatment  6/12    left eye  . Elbow surgery  8/12    Patient Active Problem List   Diagnosis Date Noted  . Edema leg 11/11/2014  . Preop cardiovascular exam   . Hypertension   . GERD (gastroesophageal reflux disease)   . Obesity   . SVT (supraventricular tachycardia)   . Ejection fraction   . Urinary incontinence       Current Outpatient Prescriptions  Medication Sig Dispense Refill  . alendronate (FOSAMAX) 70 MG tablet TAKE 1 TABLET ONCE A WEEK  3  . Calcium Carbonate (CALCIUM 600 PO) Take by mouth daily.     .  Cholecalciferol (VITAMIN D3) 2000 UNITS TABS Take 4,000 Units by mouth daily.    Marland Kitchen diltiazem (CARDIZEM CD) 180 MG 24 hr capsule Take 180 mg by mouth daily.    . fluocinonide cream (LIDEX) 0.05 % Apply 1 application topically as needed (for rash).     Marland Kitchen lisinopril-hydrochlorothiazide (PRINZIDE,ZESTORETIC) 20-12.5 MG per tablet Take 1 tablet by mouth daily.     Marland Kitchen omeprazole (PRILOSEC) 40 MG capsule Take 40 mg by mouth daily.   3  . Probiotic Product (ALIGN) 4 MG CAPS Take 4 mg by mouth daily.    . WELCHOL 625 MG tablet TAKE 1 TO 2 TABLETS BY MOUTH EVERY DAY AS NEEDED FOR DIARRHEA  1   No current facility-administered medications for this visit.    Allergies:   Review of patient's allergies indicates no known allergies.    Social History:  The patient  reports that she has quit smoking. She does not have any smokeless tobacco history on file. She reports that she does not drink alcohol or use illicit drugs.   Family History:  The patient's family history includes Diabetes in her father and mother; Heart Problems in her father; High blood pressure in her mother; Prostate cancer in her  father.    ROS:  Please see the history of present illness.     Patient denies fever, chills, headache, sweats, rash, change in vision, change in hearing, chest pain, cough, nausea or vomiting, urinary symptoms. All other systems are reviewed and are negative.   PHYSICAL EXAM: VS:  BP 122/78 mmHg  Pulse 73  Ht 5\' 6"  (1.676 m)  Wt 290 lb 6.4 oz (131.725 kg)  BMI 46.89 kg/m2  SpO2 98% , Patient is oriented to person time and place. Affect is normal. She is overweight. Head is atraumatic. Sclerae and conjunctivae are normal. There is no jugulovenous distention. Lungs are clear. Respiratory effort is not labored. Cardiac exam reveals S1 and S2. The abdomen is protuberant but soft. There is 1+ bilateral peripheral edema. She has knee replacement. She walks with a walker. Neurologic is grossly intact.  EKG:   EKG  is done today and reviewed by me. She has low P wave amplitude. However this is sinus rhythm. There is no change from the past.   Recent Labs: No results found for requested labs within last 365 days.    Lipid Panel No results found for: CHOL, TRIG, HDL, CHOLHDL, VLDL, LDLCALC, LDLDIRECT    Wt Readings from Last 3 Encounters:  11/11/14 290 lb 6.4 oz (131.725 kg)  07/31/14 280 lb (127.007 kg)  05/27/11 271 lb (122.925 kg)      Current medicines are reviewed  The patient understands her medications.     ASSESSMENT AND PLAN:

## 2014-11-21 ENCOUNTER — Other Ambulatory Visit: Payer: Self-pay

## 2014-11-21 ENCOUNTER — Ambulatory Visit (HOSPITAL_COMMUNITY): Payer: Medicare Other | Attending: Cardiology

## 2014-11-21 DIAGNOSIS — I10 Essential (primary) hypertension: Secondary | ICD-10-CM | POA: Diagnosis not present

## 2014-11-21 DIAGNOSIS — R0600 Dyspnea, unspecified: Secondary | ICD-10-CM

## 2014-11-21 DIAGNOSIS — I253 Aneurysm of heart: Secondary | ICD-10-CM | POA: Diagnosis not present

## 2014-11-21 DIAGNOSIS — I340 Nonrheumatic mitral (valve) insufficiency: Secondary | ICD-10-CM | POA: Insufficient documentation

## 2014-11-21 DIAGNOSIS — Z87891 Personal history of nicotine dependence: Secondary | ICD-10-CM | POA: Diagnosis not present

## 2014-11-21 DIAGNOSIS — K219 Gastro-esophageal reflux disease without esophagitis: Secondary | ICD-10-CM | POA: Diagnosis not present

## 2014-11-21 DIAGNOSIS — I071 Rheumatic tricuspid insufficiency: Secondary | ICD-10-CM | POA: Insufficient documentation

## 2015-01-13 ENCOUNTER — Encounter: Payer: Self-pay | Admitting: *Deleted

## 2015-01-16 ENCOUNTER — Ambulatory Visit (INDEPENDENT_AMBULATORY_CARE_PROVIDER_SITE_OTHER): Payer: Medicare Other | Admitting: Cardiology

## 2015-01-16 ENCOUNTER — Encounter: Payer: Self-pay | Admitting: Cardiology

## 2015-01-16 VITALS — BP 110/60 | HR 68 | Ht 68.0 in | Wt 286.0 lb

## 2015-01-16 DIAGNOSIS — R600 Localized edema: Secondary | ICD-10-CM | POA: Diagnosis not present

## 2015-01-16 DIAGNOSIS — R9430 Abnormal result of cardiovascular function study, unspecified: Secondary | ICD-10-CM

## 2015-01-16 DIAGNOSIS — R0989 Other specified symptoms and signs involving the circulatory and respiratory systems: Secondary | ICD-10-CM

## 2015-01-16 DIAGNOSIS — I10 Essential (primary) hypertension: Secondary | ICD-10-CM

## 2015-01-16 DIAGNOSIS — I471 Supraventricular tachycardia: Secondary | ICD-10-CM | POA: Diagnosis not present

## 2015-01-16 NOTE — Progress Notes (Signed)
Cardiology Office Note   Date:  01/16/2015   ID:  Madison [name], DOB 12/17/1940, MRN [id number]  PCP:  [name], MD  Cardiologist:  Willa Rough, MD   Chief Complaint  Patient presents with  . Appointment    Follow-up history of supraventricular tachycardia      History of Present Illness: Madison [name] is a 74 y.o. female who presents today to follow-up edema. I saw her last November 11, 2014. She has cutback extra salt and she is doing much better. Her edema is gone. She's not having any chest pain. However she is now feeling fatigued and mildly lightheaded at times. Her blood pressure in the office today is lower than usual for her. I have repeated it personally. Her systolic pressure is 105. This is definitely lower than normal for her. The patient had a two-dimensional echo since her last visit. She has excellent LV function. There are no significant valvular abnormalities.  Past Medical History  Diagnosis Date  . Hypertension   . GERD (gastroesophageal reflux disease)   . Obesity   . SVT (supraventricular tachycardia)     Documented episode in the past, possibly reentrant tachycardia  . Ejection fraction 2004    Normal, echo,   . Urinary incontinence   . Preop cardiovascular exam 11/2010    Cardiac clearance for knee surgery     Past Surgical History  Procedure Laterality Date  . Cholecystectomy  1994  . Eye surgery Left 09/2008    macular hole  . Total knee arthroplasty Left 6/11  . Cataract extraction Left 8/11  . Lumbar infusion    . Eye surgery Right 12/11  . Cataract extraction Right 3/12  . Copsilotomy laser treatment Left 6/12    eye  . Elbow surgery  8/12    Patient Active Problem List   Diagnosis Date Noted  . Edema leg 11/11/2014  . Preop cardiovascular exam   . Hypertension   . GERD (gastroesophageal reflux disease)   . Obesity   . SVT (supraventricular tachycardia)   . Ejection fraction   . Urinary incontinence       Current Outpatient  Prescriptions  Medication Sig Dispense Refill  . alendronate (FOSAMAX) 70 MG tablet TAKE 1 TABLET ONCE A WEEK  3  . Calcium Carbonate (CALCIUM 600 PO) Take by mouth daily.     . Cholecalciferol (VITAMIN D3) 2000 UNITS TABS Take 4,000 Units by mouth daily.    Marland Kitchen diltiazem (CARDIZEM CD) 180 MG 24 hr capsule Take 180 mg by mouth daily.    . fluocinonide cream (LIDEX) 0.05 % Apply 1 application topically as needed (for rash).     Marland Kitchen lisinopril-hydrochlorothiazide (PRINZIDE,ZESTORETIC) 20-12.5 MG per tablet Take 1 tablet by mouth daily.     Marland Kitchen omeprazole (PRILOSEC) 40 MG capsule Take 40 mg by mouth daily.   3  . WELCHOL 625 MG tablet TAKE 1 TO 2 TABLETS BY MOUTH EVERY DAY AS NEEDED FOR DIARRHEA  1   No current facility-administered medications for this visit.    Allergies:   Review of patient's allergies indicates no known allergies.    Social History:  The patient  reports that she has quit smoking. She does not have any smokeless tobacco history on file. She reports that she does not drink alcohol or use illicit drugs.   Family History:  The patient's family history includes Diabetes in her father and mother; Heart Problems in her father; Heart attack in her father, maternal grandfather, paternal  grandmother, and paternal uncle; High blood pressure in her mother; Prostate cancer in her father. There is no history of Stroke.    Review of systems: Patient denies fever, chills, headache, sweats, rash, change in vision, change in hearing, chest pain, cough, nausea or vomiting, urinary symptoms. All other systems are reviewed and are negative.  PHYSICAL EXAM: VS:  BP 110/60 mmHg  Pulse 68  Ht 5\' 8"  (1.727 m)  Wt 286 lb (129.729 kg)  BMI 43.50 kg/m2  SpO2 99% , Patient is oriented to person time and place. Affect is normal. She is overweight. She is here with her husband. Head is atraumatic. Sclera and conjunctiva are normal. There is no jugulovenous distention. Lungs are clear. Respiratory  effort is nonlabored. Cardiac exam reveals an S1 and S2. Abdomen is soft. There is no peripheral edema. There are no musculoskeletal deformities. She is in a wheelchair. There are no skin rashes.  EKG:  EKG is not done today.    Recent Labs: No results found for requested labs within last 365 days.    Lipid Panel No results found for: CHOL, TRIG, HDL, CHOLHDL, VLDL, LDLCALC, LDLDIRECT    Wt Readings from Last 3 Encounters:  01/16/15 286 lb (129.729 kg)  11/11/14 290 lb 6.4 oz (131.725 kg)  07/31/14 280 lb (127.007 kg)      Current medicines are reviewed  The patient understands her medications.     ASSESSMENT AND PLAN:

## 2015-01-16 NOTE — Assessment & Plan Note (Signed)
The patient's edema is gone after her attention to decreasing salt and fluid intake. 2-D echo shows that she has normal left ventricular function. No further workup.

## 2015-01-16 NOTE — Assessment & Plan Note (Signed)
The patient had a documented episode in the past of a supraventricular tachycardia. It was possibly reentrant. She does well with diltiazem. No change in therapy.

## 2015-01-16 NOTE — Assessment & Plan Note (Signed)
Echo reveals normal ejection fraction. No further workup.

## 2015-01-16 NOTE — Assessment & Plan Note (Signed)
Usually the patient is hypertensive. However today her blood pressure is 105 systolic. She has been feeling fatigued with some lightheadedness. Part of this may be related to her lower blood pressure. Her pressure may be improved with her decrease in salt and fluid intake. I've instructed her to put her lisinopril/hctz on hold. I've asked her to see her primary physician next week for follow-up. At that time her blood pressure can be reassessed. I'm hoping that she will not have return of her edema while she is off the hydrochlorothiazide. Decisions can then be made concerning whether she needs to have any of her medications resumed. She remains on diltiazem for her history of supraventricular tachycardia.

## 2015-01-16 NOTE — Patient Instructions (Signed)
Medication Instructions:  Do not take your Lisinopril/HCTZ until you see your primary care doctor to check your blood pressure and he recommends that you resume taking.  Labwork: None  Testing/Procedures: None  Follow-Up: Your physician wants you to follow-up in:  1 year f/u with Dr Delton See. You will receive a reminder letter in the mail two months in advance. If you don't receive a letter, please call our office to schedule the follow-up appointment.

## 2015-06-17 HISTORY — PX: MOLE REMOVAL: SHX2046

## 2015-07-27 DIAGNOSIS — R609 Edema, unspecified: Secondary | ICD-10-CM

## 2015-07-27 DIAGNOSIS — K529 Noninfective gastroenteritis and colitis, unspecified: Secondary | ICD-10-CM | POA: Insufficient documentation

## 2015-07-27 HISTORY — DX: Noninfective gastroenteritis and colitis, unspecified: K52.9

## 2015-07-27 HISTORY — DX: Edema, unspecified: R60.9

## 2015-09-01 DIAGNOSIS — M9080 Osteopathy in diseases classified elsewhere, unspecified site: Secondary | ICD-10-CM

## 2015-09-01 DIAGNOSIS — E889 Metabolic disorder, unspecified: Secondary | ICD-10-CM | POA: Insufficient documentation

## 2015-09-01 DIAGNOSIS — E559 Vitamin D deficiency, unspecified: Secondary | ICD-10-CM | POA: Insufficient documentation

## 2015-09-01 DIAGNOSIS — M898X9 Other specified disorders of bone, unspecified site: Secondary | ICD-10-CM | POA: Insufficient documentation

## 2015-12-04 ENCOUNTER — Institutional Professional Consult (permissible substitution): Payer: Medicare Other | Admitting: Pulmonary Disease

## 2016-01-06 ENCOUNTER — Encounter: Payer: Self-pay | Admitting: Cardiology

## 2016-01-21 ENCOUNTER — Encounter: Payer: Self-pay | Admitting: *Deleted

## 2016-01-21 ENCOUNTER — Ambulatory Visit (INDEPENDENT_AMBULATORY_CARE_PROVIDER_SITE_OTHER): Payer: Medicare Other | Admitting: Cardiology

## 2016-01-21 VITALS — BP 120/68 | HR 75 | Ht 68.0 in | Wt 280.0 lb

## 2016-01-21 DIAGNOSIS — I471 Supraventricular tachycardia: Secondary | ICD-10-CM

## 2016-01-21 DIAGNOSIS — I10 Essential (primary) hypertension: Secondary | ICD-10-CM | POA: Diagnosis not present

## 2016-01-21 DIAGNOSIS — R0600 Dyspnea, unspecified: Secondary | ICD-10-CM

## 2016-01-21 DIAGNOSIS — N183 Chronic kidney disease, stage 3 (moderate): Secondary | ICD-10-CM

## 2016-01-21 DIAGNOSIS — R600 Localized edema: Secondary | ICD-10-CM | POA: Diagnosis not present

## 2016-01-21 NOTE — Patient Instructions (Signed)

## 2016-01-21 NOTE — Progress Notes (Signed)
Cardiology Office Note    Date:  01/21/2016   ID:  Madison [name], DOB 08/25/1940, MRN [id number]  PCP:  [name], MD  Cardiologist:  Dr Myrtis Ser --> Tobias Alexander, MD   Chief complaint: Lower extremity edema.  History of Present Illness:  Madison [name] is a 74 y.o. female who has been previously followed by Dr. Myrtis Ser for lower extremity edema, improved with low-dose of Lasix and salt restriction, she also has history of SVT with heart rate about 200, well controlled on diltiazem. She hasn't had an episode in the last 15 years. 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 Her kidney function has improved with change in her medication and increase of hydration. She continues to take Lasix 20 mg daily. She denies any chest pain, walks with a walker secondary to her orthopedic problem and obesity. She underwent an echocardiogram a year ago that showed normal LVEF and grade 1*dysfunction, no significant valvular abnormalities.  TTE: 10/2014 - Left ventricle: The cavity size was normal. Wall thickness was   normal. Systolic function was normal.  - LVEF 55% to 60%. Wall motion was normal; - abnormal left ventricular relaxation (grade 1 diastolic dysfunction). - Atrial septum: There was an atrial septal aneurysm. - Pulmonary arteries: Systolic pressure was mildly increased. PA   peak pressure: 34 mm Hg (S). Impressions: - Normal LV function; grade 1 diastolic dysfunction; trace MR and   TR; mildly elevated pulmonary pressure.  Past Medical History:  Diagnosis Date  . Ejection fraction 2004   Normal, echo,   . GERD (gastroesophageal reflux disease)   . Hypertension   . Obesity   . Preop cardiovascular exam 11/2010   Cardiac clearance for knee surgery   . 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 Urinary incontinence    Past Surgical History:  Procedure  Laterality Date  . CATARACT EXTRACTION Left 8/11  . CATARACT EXTRACTION Right 3/12  . CHOLECYSTECTOMY  1994  . Copsilotomy Laser Treatment Left 6/12   eye  . ELBOW SURGERY  8/12  . 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 EYE SURGERY Right 12/11  . Lumbar Infusion    . 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 alendronate (FOSAMAX) 70 MG tablet TAKE 1 TABLET ONCE A WEEK  3  . Calcium Carbonate (CALCIUM 600 PO) Take by mouth daily.     . Cholecalciferol (VITAMIN D3) 2000 UNITS TABS Take 4,000 Units by mouth daily.    Marland Kitchen diltiazem (CARDIZEM CD) 180 MG 24 hr capsule Take 180 mg by mouth daily.    . fluocinonide cream (LIDEX) 0.05 % Apply 1 application topically as needed (for rash).     Marland Kitchen omeprazole (PRILOSEC) 40 MG capsule Take 40 mg by mouth daily.   3  . WELCHOL 625 MG tablet TAKE 1 TO 2 TABLETS BY MOUTH EVERY DAY AS NEEDED FOR DIARRHEA  1  . lisinopril-hydrochlorothiazide (PRINZIDE,ZESTORETIC) 20-12.5 MG per tablet Take 1 tablet by mouth daily.      No facility-administered medications prior to visit.     Allergies:   Review of patient's allergies indicates no known allergies.   Social History   Social History  . Marital status: Married    Spouse name: N/A  . Number of children: N/A  .  Years of education: N/A   Social History Main Topics  . Smoking status: Former Games developer  . Smokeless tobacco: Never Used  . Alcohol use No  . Drug use: No  . Sexual activity: Not on file   Other Topics Concern  . Not on file   Social History Narrative  . No narrative on file    Family History:  The patient's family history includes Diabetes in her father and mother; Heart Problems in her father; Heart attack in her father, maternal grandfather, paternal grandmother, and paternal uncle; High blood pressure in her mother; Prostate cancer in her father.   ROS:   Please see the history of present  illness.    ROS All other systems reviewed and are negative.   PHYSICAL EXAM:   VS:  BP 120/68   Pulse 75   Ht 5\' 8"  (1.727 m)   Wt 280 lb (127 kg)   BMI 42.57 kg/m    GEN: Well nourished, well developed, in no acute distress  HEENT: normal  Neck: no JVD, carotid bruits, or masses Cardiac: RRR; no murmurs, rubs, or gallops,no edema  Respiratory:  clear to auscultation bilaterally, normal work of breathing GI: soft, nontender, nondistended, + BS MS: no deformity or atrophy  Skin: warm and dry, no rash Neuro:  Alert and Oriented x 3, Strength and sensation are intact Psych: euthymic mood, full affect  Wt Readings from Last 3 Encounters:  01/21/16 280 lb (127 kg)  01/16/15 286 lb (129.7 kg)  11/11/14 290 lb 6.4 oz (131.7 kg)    Studies/Labs Reviewed:   EKG:  EKG is ordered today.  The ekg ordered today demonstrates Sinus rhythm and normal EKG unchanged from prior.  Recent Labs: No results found for requested labs within last 8760 hours.   Lipid Panel No results found for: CHOL, TRIG, HDL, CHOLHDL, VLDL, LDLCALC, LDLDIRECT  ASSESSMENT:    1. Essential hypertension   2. SVT (supraventricular tachycardia) (HCC)   3. Dyspnea   4. Bilateral edema of lower extremity   5. Chronic kidney disease (CKD), stage 3 (moderate)     PLAN:  In order of problems listed above:  1. Bilateral lower extremity edema has improved on Lasix 20 mg by mouth daily 2. She is being followed by nephrologist for CK D stage III her most recent creatinine 1.1 and GFR was 46. 3. No recurrent SVT I would continue same dose of diltiazem 4. Blood pressure is well controlled.  Medication Adjustments/Labs and Tests Ordered: Current medicines are reviewed at length with the patient today.  Concerns regarding medicines are outlined above.  Medication changes, Labs and Tests ordered today are listed in the Patient Instructions below. Patient Instructions  Medication Instructions:   Your physician  recommends that you continue on your current medications as directed. Please refer to the Current Medication list given to you today.    Follow-Up:  Your physician wants you to follow-up in: ONE  YEAR WITH DR 11/13/14 will receive a reminder letter in the mail two months in advance. If you don't receive a letter, please call our office to schedule the follow-up appointment.        If you need a refill on your cardiac medications before your next appointment, please call your pharmacy.      Signed, Johnell Comings, MD  01/21/2016 11:34 AM    [facility] [address] [place], [place], [place]  [place] Phone: [phone]; Fax: [phone]

## 2016-07-04 ENCOUNTER — Other Ambulatory Visit (HOSPITAL_BASED_OUTPATIENT_CLINIC_OR_DEPARTMENT_OTHER): Payer: Self-pay | Admitting: Sports Medicine

## 2016-07-04 DIAGNOSIS — M5416 Radiculopathy, lumbar region: Secondary | ICD-10-CM

## 2016-07-09 ENCOUNTER — Ambulatory Visit (HOSPITAL_BASED_OUTPATIENT_CLINIC_OR_DEPARTMENT_OTHER)
Admission: RE | Admit: 2016-07-09 | Discharge: 2016-07-09 | Disposition: A | Discharge status: Home / Self Care | Payer: Medicare Other | Source: Ambulatory Visit | Attending: Sports Medicine | Admitting: Sports Medicine

## 2016-07-09 DIAGNOSIS — M5136 Other intervertebral disc degeneration, lumbar region: Secondary | ICD-10-CM | POA: Insufficient documentation

## 2016-07-09 DIAGNOSIS — M5410 Radiculopathy, site unspecified: Secondary | ICD-10-CM | POA: Diagnosis present

## 2016-07-09 DIAGNOSIS — M48061 Spinal stenosis, lumbar region without neurogenic claudication: Secondary | ICD-10-CM | POA: Diagnosis not present

## 2016-07-09 DIAGNOSIS — M5416 Radiculopathy, lumbar region: Secondary | ICD-10-CM

## 2016-07-21 ENCOUNTER — Other Ambulatory Visit: Payer: Self-pay | Admitting: Neurological Surgery

## 2016-07-21 DIAGNOSIS — M48062 Spinal stenosis, lumbar region with neurogenic claudication: Secondary | ICD-10-CM

## 2016-07-27 ENCOUNTER — Other Ambulatory Visit: Payer: Self-pay | Admitting: Neurological Surgery

## 2016-07-27 ENCOUNTER — Ambulatory Visit
Admission: RE | Admit: 2016-07-27 | Discharge: 2016-07-27 | Disposition: A | Discharge status: Home / Self Care | Payer: Medicare Other | Source: Ambulatory Visit | Attending: Neurological Surgery | Admitting: Neurological Surgery

## 2016-07-27 DIAGNOSIS — M48062 Spinal stenosis, lumbar region with neurogenic claudication: Secondary | ICD-10-CM

## 2016-07-27 MED ORDER — METHYLPREDNISOLONE ACETATE 40 MG/ML INJ SUSP (RADIOLOG
120.0000 mg | Freq: Once | INTRAMUSCULAR | Status: AC
  Administered 2016-07-27: 120 mg via EPIDURAL

## 2016-07-27 MED ORDER — IOPAMIDOL (ISOVUE-M 200) INJECTION 41%
1.0000 mL | Freq: Once | INTRAMUSCULAR | Status: AC
  Administered 2016-07-27: 1 mL via EPIDURAL

## 2016-07-27 NOTE — Discharge Instructions (Signed)

## 2016-09-26 ENCOUNTER — Other Ambulatory Visit: Payer: Self-pay | Admitting: Cardiology

## 2016-09-26 MED ORDER — DILTIAZEM HCL ER COATED BEADS 180 MG PO CP24: 180.0000 mg | ORAL_CAPSULE | Freq: Every day | ORAL | 0 refills | Status: DC

## 2016-10-07 DIAGNOSIS — J3489 Other specified disorders of nose and nasal sinuses: Secondary | ICD-10-CM | POA: Insufficient documentation

## 2016-12-26 ENCOUNTER — Other Ambulatory Visit: Payer: Self-pay | Admitting: Cardiology

## 2017-01-18 ENCOUNTER — Encounter: Payer: Self-pay | Admitting: Cardiology

## 2017-01-26 ENCOUNTER — Ambulatory Visit (INDEPENDENT_AMBULATORY_CARE_PROVIDER_SITE_OTHER): Payer: Medicare Other | Admitting: Cardiology

## 2017-01-26 VITALS — BP 122/62 | HR 63 | Ht 68.0 in | Wt 244.0 lb

## 2017-01-26 DIAGNOSIS — Z01810 Encounter for preprocedural cardiovascular examination: Secondary | ICD-10-CM | POA: Diagnosis not present

## 2017-01-26 DIAGNOSIS — N183 Chronic kidney disease, stage 3 (moderate): Secondary | ICD-10-CM

## 2017-01-26 DIAGNOSIS — R600 Localized edema: Secondary | ICD-10-CM

## 2017-01-26 DIAGNOSIS — I129 Hypertensive chronic kidney disease with stage 1 through stage 4 chronic kidney disease, or unspecified chronic kidney disease: Secondary | ICD-10-CM

## 2017-01-26 DIAGNOSIS — I471 Supraventricular tachycardia: Secondary | ICD-10-CM | POA: Diagnosis not present

## 2017-01-26 MED ORDER — DILTIAZEM HCL ER COATED BEADS 180 MG PO CP24: ORAL_CAPSULE | ORAL | 3 refills | Status: DC

## 2017-01-26 MED ORDER — POTASSIUM CHLORIDE CRYS ER 20 MEQ PO TBCR: 20.0000 meq | EXTENDED_RELEASE_TABLET | Freq: Every day | ORAL | 3 refills | Status: DC

## 2017-01-26 MED ORDER — FUROSEMIDE 20 MG PO TABS: 20.0000 mg | ORAL_TABLET | Freq: Every day | ORAL | 3 refills | Status: DC

## 2017-01-26 NOTE — Patient Instructions (Signed)
Medication Instructions:  REFILLS HAVE BEEN SENT IN FOR YOUR CARDIAC MEDICATIONS  Your physician recommends that you continue on your current medications as directed. Please refer to the Current Medication list given to you today.   Labwork: NONE ORDERED TODAY  Testing/Procedures: NONE ORDERED TODAY  Follow-Up: DR. Delton See 3  MONTHS  Any Other Special Instructions Will Be Listed Below (If Applicable).     If you need a refill on your cardiac medications before your next appointment, please call your pharmacy.

## 2017-01-26 NOTE — Progress Notes (Signed)
Cardiology Office Note    Date:  01/26/2017   ID:  CECILLIA MENEES, DOB 11/08/1940, MRN [id number]  PCP:  [name], MD  Cardiologist:  Dr Myrtis Ser --> Tobias Alexander, MD   Chief complaint/reason for visit: One-year follow-up, Preoperative evaluation for hip replacement.  History of Present Illness:  Madison [name] is a 76 y.o. female who has been previously followed by Dr. Myrtis Ser for lower extremity edema, improved with low-dose of Lasix and salt restriction, she also has history of SVT with heart rate about 200, well controlled on diltiazem. She hasn't had an episode in the last 15 years. 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 Her kidney function has improved with change in her medication and increase of hydration. She continues to take Lasix 20 mg daily. She denies any chest pain, walks with a walker secondary to her orthopedic problem and obesity. She underwent an echocardiogram a year ago that showed normal LVEF and grade 1*dysfunction, no significant valvular abnormalities.  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 She is waiting for hip replacement. Surgeon insisted that she has to lose minimum of 40 pounds before he would consider for surgery. She has lost 45 pounds in the last 6 months. She brings preop clearance paper with her to be sign. She denies any chest pain no shortness of breath no lower extremity edema orthopnea proximal nocturnal dyspnea she has noticed some swelling in her fingers but her readings being tight. Otherwise denies palpitations dizziness or syncope.  Past Medical History:  Diagnosis Date  . Ejection fraction 2004   Normal, echo,   . GERD (gastroesophageal reflux disease)   . Hypertension   . Obesity   . Preop cardiovascular exam 11/2010   Cardiac clearance for knee surgery   . SVT (supraventricular  tachycardia) (HCC)    Documented episode in the past, possibly reentrant tachycardia  . Urinary incontinence    Past Surgical History:  Procedure Laterality Date  . CATARACT EXTRACTION Left 8/11  . CATARACT EXTRACTION Right 3/12  . CHOLECYSTECTOMY  1994  . Copsilotomy Laser Treatment Left 6/12   eye  . ELBOW SURGERY  8/12  . 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 EYE SURGERY Right 12/11  . Lumbar Infusion  2011  . MOLE REMOVAL  06/17/2015  . 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 Calcium Carbonate (CALCIUM 600 PO) Take by mouth daily.     . Cholecalciferol (VITAMIN D3) 2000 UNITS TABS Take 4,000 Units by mouth daily.    . fluocinonide cream (LIDEX) 0.05 % Apply 1 application topically as needed (for rash).     Marland Kitchen omeprazole (PRILOSEC) 40 MG capsule Take 40 mg by mouth daily.   3  . Prenatal Vit-Fe Fumarate-FA (PRENATAL MULTIVITAMIN) TABS tablet Take 1 tablet by mouth daily at 12 noon.    . WELCHOL 625 MG tablet TAKE 1 TO 2 TABLETS BY MOUTH EVERY DAY AS NEEDED FOR DIARRHEA  1  . alendronate (FOSAMAX) 70 MG tablet TAKE 1 TABLET ONCE A WEEK  3  . diltiazem (CARDIZEM CD) 180 MG 24 hr capsule TAKE 1 CAPSULE BY MOUTH EVERY DAY 30 capsule 0  . furosemide (LASIX) 20 MG tablet  Take 20 mg by mouth.    . potassium chloride SA (K-DUR,KLOR-CON) 20 MEQ tablet Take 20 mEq by mouth daily.     No facility-administered medications prior to visit.     Allergies:   Patient has no known allergies.   Social History   Social History  . Marital status: Married    Spouse name: N/A  . Number of children: N/A  . Years of education: N/A   Social History Main Topics  . Smoking status: Former Games developer  . Smokeless tobacco: Never Used  . Alcohol use No  . Drug use: No  . Sexual activity: Not on file   Other Topics Concern  . Not on file   Social History  Narrative  . No narrative on file    Family History:  The patient's family history includes Diabetes in her father and mother; Heart Problems in her father; Heart attack in her father, maternal grandfather, paternal grandmother, and paternal uncle; High blood pressure in her mother; Prostate cancer in her father.   ROS:   Please see the history of present illness.    ROS All other systems reviewed and are negative.  PHYSICAL EXAM:   VS:  BP 122/62   Pulse 63   Ht 5\' 8"  (1.727 m)   Wt 244 lb (110.7 kg)   BMI 37.10 kg/m    GEN: Well nourished, well developed, in no acute distress  HEENT: normal  Neck: no JVD, carotid bruits, or masses Cardiac: RRR; no murmurs, rubs, or gallops,no edema  Respiratory:  clear to auscultation bilaterally, normal work of breathing GI: soft, nontender, nondistended, + BS MS: no deformity or atrophy  Skin: warm and dry, no rash Neuro:  Alert and Oriented x 3, Strength and sensation are intact Psych: euthymic mood, full affect  Wt Readings from Last 3 Encounters:  01/26/17 244 lb (110.7 kg)  01/21/16 280 lb (127 kg)  01/16/15 286 lb (129.7 kg)    Studies/Labs Reviewed:   EKG:  EKG is ordered todayAnd personally reviewed, shows sinus rhythm normal EKG unchanged from prior.  TTE: 10/2014 - Left ventricle: The cavity size was normal. Wall thickness was   normal. Systolic function was normal.  - LVEF 55% to 60%. Wall motion was normal; - abnormal left ventricular relaxation (grade 1 diastolic dysfunction). - Atrial septum: There was an atrial septal aneurysm. - Pulmonary arteries: Systolic pressure was mildly increased. PA   peak pressure: 34 mm Hg (S). Impressions: - Normal LV function; grade 1 diastolic dysfunction; trace MR and   TR; mildly elevated pulmonary pressure.  Recent Labs: No results found for requested labs within last 8760 hours.   Lipid Panel No results found for: CHOL, TRIG, HDL, CHOLHDL, VLDL, LDLCALC,  LDLDIRECT    ASSESSMENT:    1. Benign hypertension with chronic kidney disease, stage III (HCC)   2. Bilateral edema of lower extremity   3. Preop cardiovascular exam   4. SVT (supraventricular tachycardia) (HCC)     PLAN:  In order of problems listed above:  1. Bilateral lower extremity edema - resolved on Lasix 20 mg by mouth daily 2. Hypertension -well controlled 3. No recurrent SVT I would continue same dose of diltiazem 4. Preoperative evaluation for hip arthroplasty - there is currently no contraindication from cardiac standpoint for this patient to undergo hip replacement. We will follow her 3 months post surgery.  Medication Adjustments/Labs and Tests Ordered: Current medicines are reviewed at length with the patient today.  Concerns regarding  medicines are outlined above.  Medication changes, Labs and Tests ordered today are listed in the Patient Instructions below. Patient Instructions  Medication Instructions:  REFILLS HAVE BEEN SENT IN FOR YOUR CARDIAC MEDICATIONS  Your physician recommends that you continue on your current medications as directed. Please refer to the Current Medication list given to you today.   Labwork: NONE ORDERED TODAY  Testing/Procedures: NONE ORDERED TODAY  Follow-Up: DR. Delton See 3  MONTHS  Any Other Special Instructions Will Be Listed Below (If Applicable).     If you need a refill on your cardiac medications before your next appointment, please call your pharmacy.      Signed, Tobias Alexander, MD  01/26/2017 1:58 PM    [facility] [address] [place], [place], [place]  [address] Phone: [phone]; Fax: [phone]

## 2017-07-18 ENCOUNTER — Telehealth: Payer: Self-pay | Admitting: Cardiology

## 2017-07-18 NOTE — Telephone Encounter (Signed)
New Message °

## 2017-07-18 NOTE — Telephone Encounter (Signed)
Pt is calling to speak with Dr Malachy Mood RN and sleep coordinator, Coralee North.  Pt states they follow her for sleep and she requested to speak with them, not Dr Delton See. Will route this message to Veda Canning, sleep study coordinator and Dr Malachy Mood RN, for further review and follow-up with the pt.

## 2017-07-18 NOTE — Telephone Encounter (Signed)
New Message    Patient is needing a new prescription for her cpap machine. Please call to discuss.

## 2017-07-20 NOTE — Telephone Encounter (Signed)
Spoke to patient concerning her wanting a new CPAP machine. Informed patient I will have medical records send for her sleep study where she tested at [facility] sleep. Will route to Dr Mayford Knife for advisement.

## 2017-07-21 NOTE — Telephone Encounter (Signed)
There is nothing in Dr. Joanie Coddington notes that she even has OSA.  Please find out who put her on CPAP

## 2017-07-24 NOTE — Telephone Encounter (Signed)
Patient states you put her on cpap in June of 2012 at [facility]. There are no records in epic to indicate a diagnosis of osa that I can find nor any recent sleep study. Patient has not been seen by you according to her recollection since 2012. How would you like to proceed?

## 2017-07-25 NOTE — Telephone Encounter (Signed)
Need to get old records from [facility]

## 2017-07-27 NOTE — Telephone Encounter (Signed)
Sleep study from [facility] sent to medical records and scanned in.Study results from 2012. Will defer back to Dr Mayford Knife for advisement.

## 2017-07-28 NOTE — Telephone Encounter (Signed)
I cannot order anything for her without an OV first

## 2017-08-02 NOTE — Telephone Encounter (Signed)
Pt is aware and agreeable to office visit on 09/25/17. Sleep study in epic from July 2012.

## 2017-09-25 ENCOUNTER — Encounter (INDEPENDENT_AMBULATORY_CARE_PROVIDER_SITE_OTHER): Payer: Self-pay

## 2017-09-25 ENCOUNTER — Encounter: Payer: Self-pay | Admitting: Cardiology

## 2017-09-25 ENCOUNTER — Ambulatory Visit: Payer: Medicare Other | Admitting: Cardiology

## 2017-09-25 ENCOUNTER — Telehealth: Payer: Self-pay | Admitting: *Deleted

## 2017-09-25 VITALS — BP 148/72 | HR 73 | Ht 68.0 in | Wt 254.0 lb

## 2017-09-25 DIAGNOSIS — E669 Obesity, unspecified: Secondary | ICD-10-CM

## 2017-09-25 DIAGNOSIS — N183 Chronic kidney disease, stage 3 unspecified: Secondary | ICD-10-CM

## 2017-09-25 DIAGNOSIS — G4733 Obstructive sleep apnea (adult) (pediatric): Secondary | ICD-10-CM | POA: Diagnosis not present

## 2017-09-25 DIAGNOSIS — I129 Hypertensive chronic kidney disease with stage 1 through stage 4 chronic kidney disease, or unspecified chronic kidney disease: Secondary | ICD-10-CM

## 2017-09-25 HISTORY — DX: Obesity, unspecified: E66.9

## 2017-09-25 NOTE — Patient Instructions (Signed)
Medication Instructions:  Your physician recommends that you continue on your current medications as directed. Please refer to the Current Medication list given to you today.  Labwork: None Ordered   Testing/Procedures: None Ordered   Follow-Up: Your physician recommends that you schedule a follow-up appointment in: 10 weeks with Dr. Turner   Any Other Special Instructions Will Be Listed Below (If Applicable).  CPAP orders have been placed. You will receive a call from the home health agency regarding setting up equipment. If you do not receive a call within the next week give Nina, CPAP assistant a call at [phone].   Thank you for choosing [facility]    Rena Lynnett Langlinais, RN  [phone]    If you need a refill on your cardiac medications before your next appointment, please call your pharmacy.   

## 2017-09-25 NOTE — Telephone Encounter (Signed)
-----   Message from Phineas Semen, RN sent at 09/25/2017  3:34 PM EDT ----- Regarding: dme order dme order placed

## 2017-09-25 NOTE — Progress Notes (Signed)
Cardiology Office Note:    Date:  09/25/2017   ID:  Madison [name], DOB May 04, 1940, MRN [id number]  PCP:  [name], MD  Cardiologist:  No primary care provider on file.    Referring MD: [name], MD   Chief Complaint  Patient presents with  . Sleep Apnea    History of Present Illness:    Madison [name] is a 77 y.o. female with a hx of GERD, hypertension and SVT was followed by Dr. Delton See.  She was diagnosed with obstructive sleep apnea in June 2012.  Sleep study showed mild obstructive sleep apnea with an AHI of 6.75/h and oxygen saturations as low as 86%.  She was placed on CPAP therapy and then was lost to care.  She is now here because she would like a new CPAP device.  She is on CPAP at 11 cm water pressure and is doing well.  She denies any snoring when she uses her device but does still snore she sits in the chair not using her Pap device.  She denies any mouth dryness or nasal dryness or congestion.  She feels rested in the morning and has no significant daytime sleepiness except around 3-5 o'clock once in a while she will have to take a nap.  Her device is 77 years old and she would like to get a new device.  Past Medical History:  Diagnosis Date  . Ejection fraction 2004   Normal, echo,   . GERD (gastroesophageal reflux disease)   . Hypertension   . Obesity   . Obesity (BMI 30-39.9) 09/25/2017  . OSA (obstructive sleep apnea)    mild with AHI 6.75 - on CPAP  . Preop cardiovascular exam 11/2010   Cardiac clearance for knee surgery   . 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 Urinary incontinence     Past Surgical History:  Procedure Laterality Date  . CATARACT EXTRACTION Left 8/11  . CATARACT EXTRACTION Right 3/12  . CHOLECYSTECTOMY  1994  . Copsilotomy Laser Treatment Left 6/12   eye  . ELBOW SURGERY  8/12  . 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 EYE SURGERY Right 12/11  . Lumbar  Infusion  2011  . MOLE REMOVAL  06/17/2015  . 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 TOTAL KNEE ARTHROPLASTY Right 06/13/2011    Current Medications: Current Meds  Medication Sig  . acetaminophen (TYLENOL) 500 MG tablet Take 1 tablet by mouth as needed for pain.  . Calcium Carbonate (CALCIUM 600 PO) Take by mouth daily.   . Cholecalciferol (VITAMIN D3) 2000 UNITS TABS Take 4,000 Units by mouth daily.  Marland Kitchen diltiazem (CARDIZEM CD) 180 MG 24 hr capsule TAKE 1 CAPSULE BY MOUTH EVERY DAY  . fluocinonide cream (LIDEX) 0.05 % Apply 1 application topically as needed (for rash).   . furosemide (LASIX) 20 MG tablet Take 20 mg by mouth 2 (two) times daily.  Marland Kitchen omeprazole (PRILOSEC) 40 MG capsule Take 40 mg by mouth once a week.   . potassium chloride SA (K-DUR,KLOR-CON) 20 MEQ tablet Take 1 tablet (20 mEq total) by mouth daily.  . Prenatal Vit-Fe Fumarate-FA (PRENATAL MULTIVITAMIN) TABS tablet Take 1 tablet by mouth daily at 12 noon.  . WELCHOL 625 MG tablet TAKE 1 TO 2 TABLETS BY MOUTH EVERY DAY AS NEEDED FOR DIARRHEA  . [DISCONTINUED] furosemide (LASIX) 20 MG tablet Take 1 tablet (20 mg total) by mouth  daily. (Patient taking differently: Take 20 mg by mouth 2 (two) times daily. )     Allergies:   Patient has no known allergies.   Social History   Socioeconomic History  . Marital status: Married    Spouse name: Not on file  . Number of children: Not on file  . Years of education: Not on file  . Highest education level: Not on file  Occupational History  . Not on file  Social Needs  . Financial resource strain: Not on file  . Food insecurity:    Worry: Not on file    Inability: Not on file  . Transportation needs:    Medical: Not on file    Non-medical: Not on file  Tobacco Use  . Smoking status: Former Games developer  . Smokeless tobacco: Never Used  Substance and Sexual Activity  . Alcohol use: No  . Drug use: No  . Sexual activity: Not on file  Lifestyle  . Physical activity:    Days  per week: Not on file    Minutes per session: Not on file  . Stress: Not on file  Relationships  . Social connections:    Talks on phone: Not on file    Gets together: Not on file    Attends religious service: Not on file    Active member of club or organization: Not on file    Attends meetings of clubs or organizations: Not on file    Relationship status: Not on file  Other Topics Concern  . Not on file  Social History Narrative  . Not on file     Family History: The patient's family history includes Diabetes in her father and mother; Heart Problems in her father; Heart attack in her father, maternal grandfather, paternal grandmother, and paternal uncle; High blood pressure in her mother; Prostate cancer in her father. There is no history of Stroke.  ROS:   Please see the history of present illness.    ROS  All other systems reviewed and negative.   EKGs/Labs/Other Studies Reviewed:    The following studies were reviewed today: none  EKG:  EKG is not ordered today.   Recent Labs: No results found for requested labs within last 8760 hours.   Recent Lipid Panel No results found for: CHOL, TRIG, HDL, CHOLHDL, VLDL, LDLCALC, LDLDIRECT  Physical Exam:    VS:  BP (!) 148/72   Pulse 73   Ht 5\' 8"  (1.727 m)   Wt 254 lb (115.2 kg)   SpO2 96%   BMI 38.62 kg/m     Wt Readings from Last 3 Encounters:  09/25/17 254 lb (115.2 kg)  01/26/17 244 lb (110.7 kg)  01/21/16 280 lb (127 kg)     GEN:  Well nourished, well developed in no acute distress HEENT: Normal NECK: No JVD; No carotid bruits LYMPHATICS: No lymphadenopathy CARDIAC: RRR, no murmurs, rubs, gallops RESPIRATORY:  Clear to auscultation without rales, wheezing or rhonchi  ABDOMEN: Soft, non-tender, non-distended MUSCULOSKELETAL:  No edema; No deformity  SKIN: Warm and dry NEUROLOGIC:  Alert and oriented x 3 PSYCHIATRIC:  Normal affect   ASSESSMENT:    1. OSA (obstructive sleep apnea)   2. Benign  hypertension with chronic kidney disease, stage III (HCC)   3. Obesity (BMI 30-39.9)    PLAN:    In order of problems listed above:  1.  OSA - the patient is tolerating PAP therapy well without any problems. The PAP download was reviewed today  and showed an AHI of 0.3/hr on 11 cm H2O with 100% compliance in using more than 4 hours nightly.  The patient has been using and benefiting from PAP use and will continue to benefit from therapy.  Her device is 77 years old and she would like a new one.  I will order her a DreamStation CPAP at 11 cm water pressure with heated humidity.  I will also order her a ResMed P 20 air fit mask with supplies.  She will follow-up with me in 10 weeks per requirements by insurance to document compliance.  2.  Hypertension - BP is well controlled on exam today.  She will continue on Cardizem CD 180 mg daily.  3.  Obesity -her exercises learned at this time after having a hip replacement done back in the fall.  She is walking with a walker.     Medication Adjustments/Labs and Tests Ordered: Current medicines are reviewed at length with the patient today.  Concerns regarding medicines are outlined above.  Orders Placed This Encounter  Procedures  . For home use only DME continuous positive airway pressure (CPAP)   No orders of the defined types were placed in this encounter.   Signed, Armanda Magic, MD  09/25/2017 10:46 AM    [facility]

## 2017-09-26 NOTE — Telephone Encounter (Signed)
Faxed to [facility]  

## 2017-09-26 NOTE — Telephone Encounter (Signed)
Dream Statin CPAP at 11 cm water pressure with heated humidity and Resmed Airfit P20 mask with supplies

## 2017-09-30 IMAGING — MR MR LUMBAR SPINE W/O CM
4 of 5 series · 24 of 48 positions shown · non-contrast
Comparison: Lumbar spine MRI 08/09/2014

CLINICAL DATA: Bilateral lower extremity weakness. Pain in both
hips and in the right leg. Prior lumbar spine surgery.

EXAM:
MRI LUMBAR SPINE WITHOUT CONTRAST
TECHNIQUE: Multiplanar, multisequence MR imaging of the lumbar spine was
performed. No intravenous contrast was administered.

[Series 3: T1 · sagittal · 4.0mm · 0.51mm/px · 6 of 15 slices shown (1 of 2)]
[im 1/15]
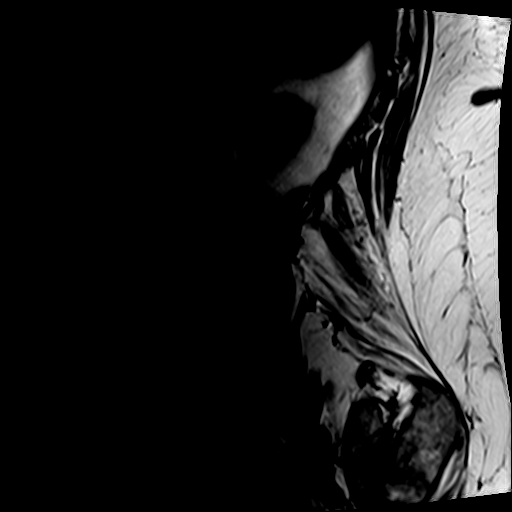
[im 3/15]
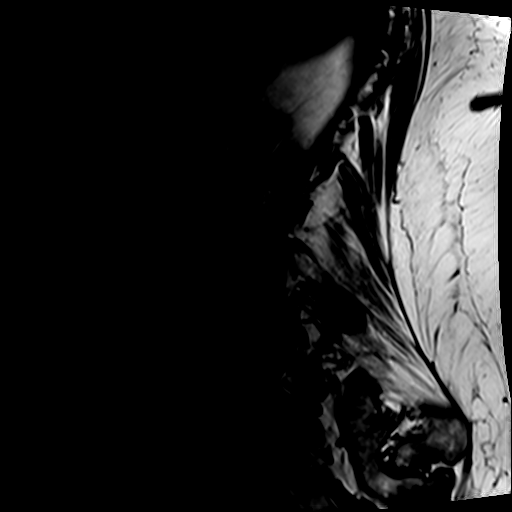
[im 6/15]
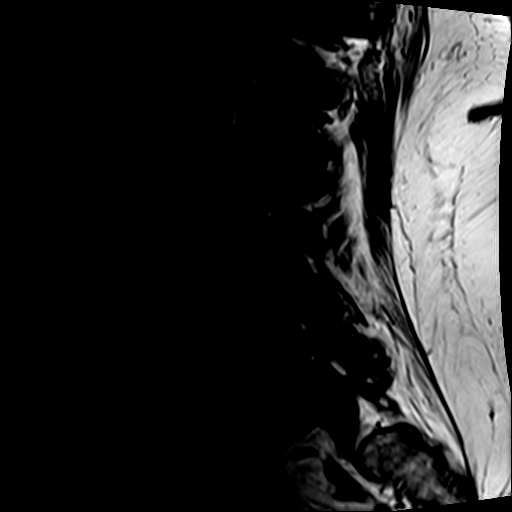
[im 9/15]
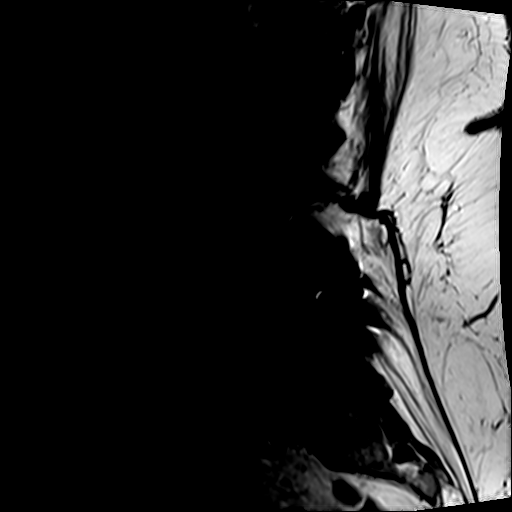
[im 12/15]
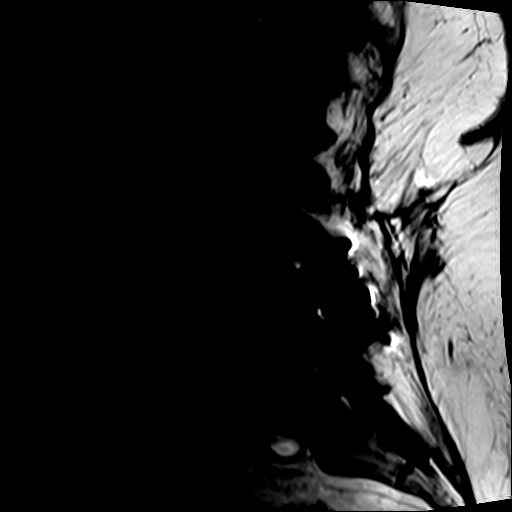
[im 15/15]
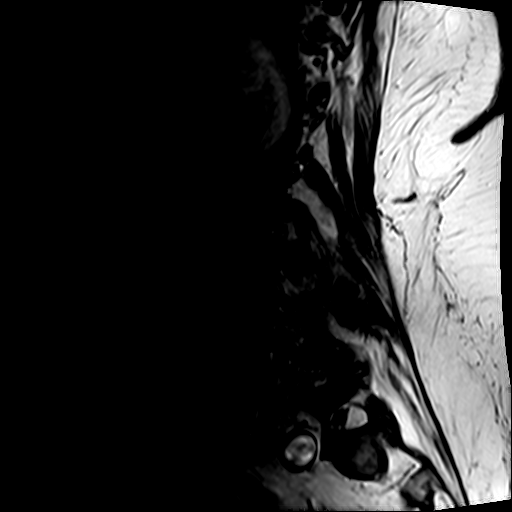

[Series 4: T2 · sagittal · 4.0mm · 1.02mm/px · 6 of 15 slices shown (1 of 2)]
[im 1/15]
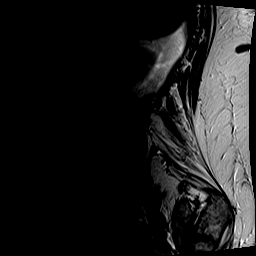
[im 3/15]
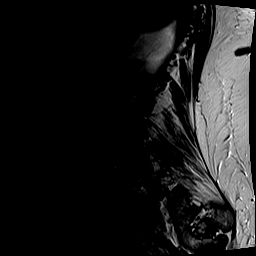
[im 6/15]
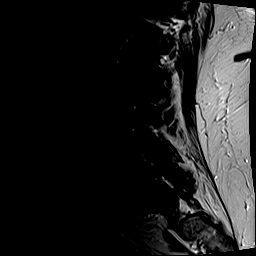
[im 9/15]
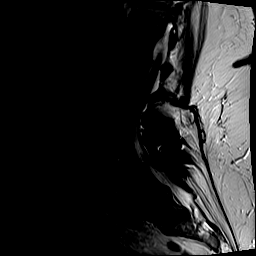
[im 12/15]
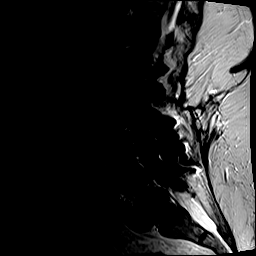
[im 15/15]
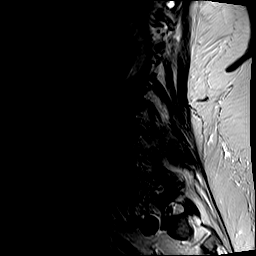

[Series 101: T2 · axial · 4.0mm · 0.39mm/px · z∈[-192,+22]mm · 9 of 39 slices shown (2 of 2)]
[im 1/39]
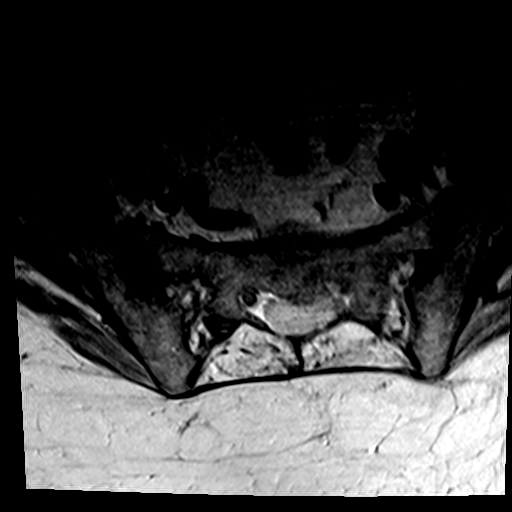
[im 6/39]
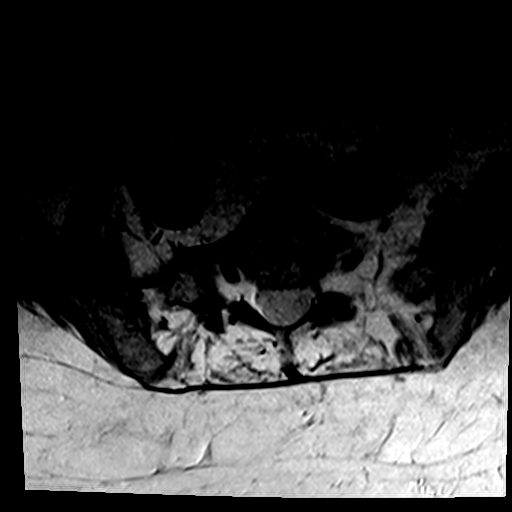
[im 11/39]
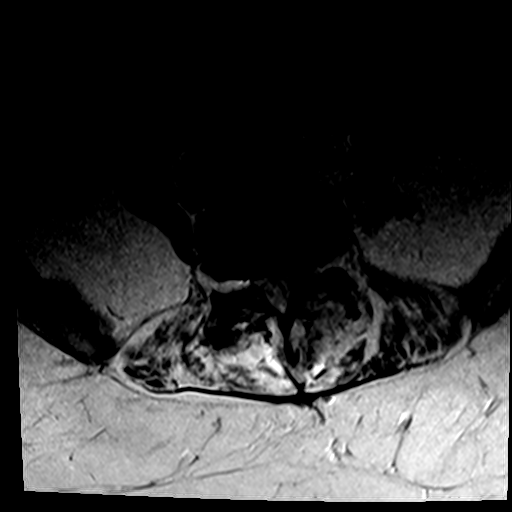
[im 17/39]
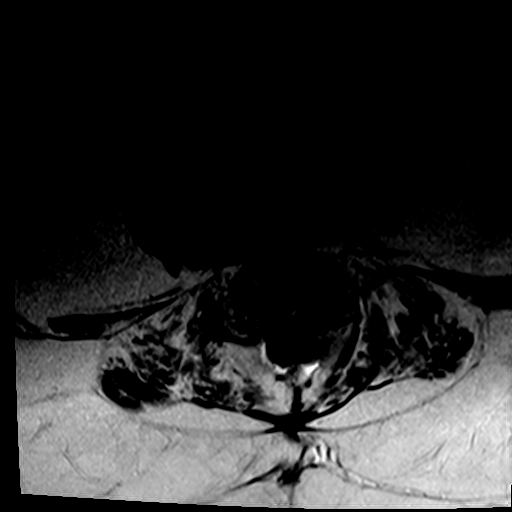
[im 20/39]
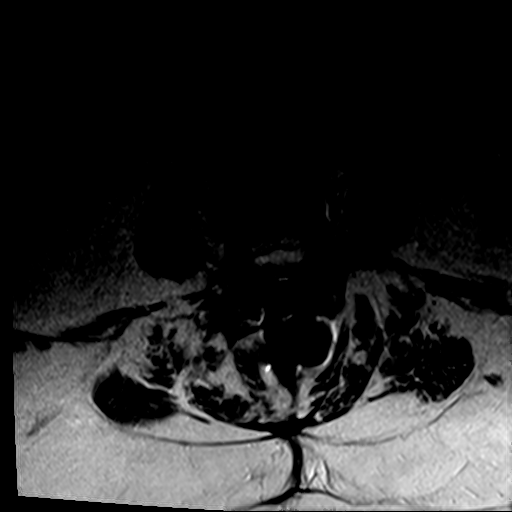
[im 22/39]
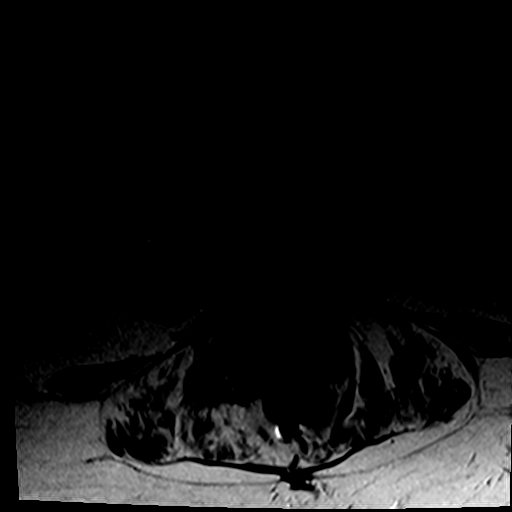
[im 28/39]
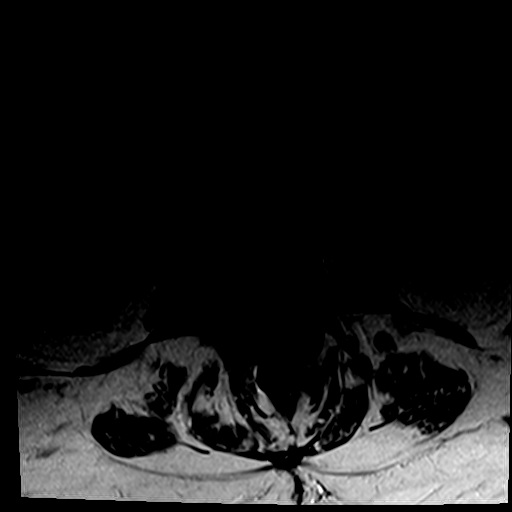
[im 33/39]
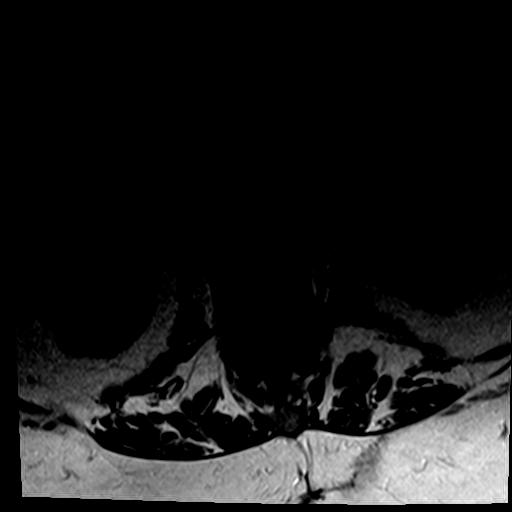
[im 39/39]
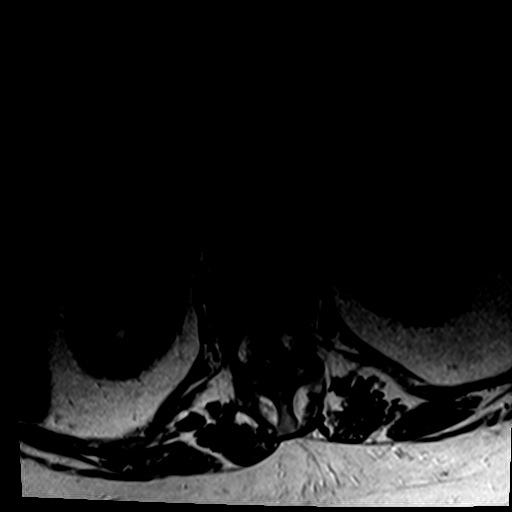

[Series 5003: T1 · axial · 4.0mm · 0.78mm/px · z∈[-169,-7]mm · 3 of 39 slices shown (2 of 2)]
[im 6/39]
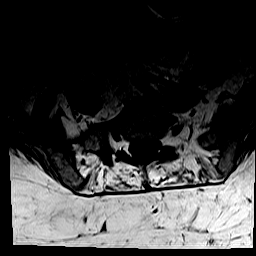
[im 20/39]
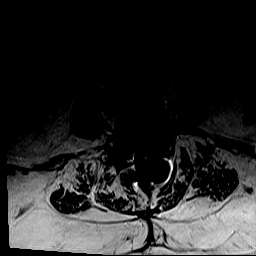
[im 33/39]
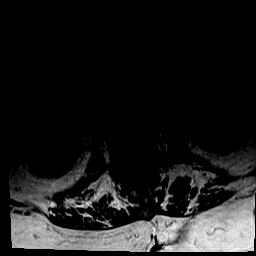

[24 of 48 positions shown; findings below may reference images not displayed]

FINDINGS: Despite efforts by the technologist and patient, motion artifact is
present on today's examination and could not be eliminated. This
reduces the sensitivity and specificity of the study.

Segmentation: There is transitional lumbosacral anatomy with a
lumbarized S1 body. The numbering of the vertebral levels is
preserved from the 08/09/2014 study.

Alignment: Grade 1 retrolisthesis at L1-L2 and grade 1
anterolisthesis at L3-L4, L4-L5 and L5-S1 are all unchanged.

Vertebrae: Posterior decompression at the L3-S1 levels. There has
been progressive height loss of the L3 vertebral body but no marrow
edema. There is a Schmorl's node at the superior endplate of L1,
unchanged. There are degenerative endplate signal changes at
multiple levels. No acute compression fracture. No focal marrow
lesion.

Conus medullaris: Extends to the L2 level and appears normal.

Paraspinal and other soft tissues: The visualized aorta, IVC and
iliac vessels are normal. The visualized retroperitoneal organs and
paraspinal soft tissues are normal.

Disc levels:

T11-L1: Evaluated on sagittal sequences pole any. No significant
disc herniation. No spinal canal or neural foraminal stenosis.

L1-L2: Osteophyte arising from the posterior inferior L1 endplate
mildly narrowing the spinal canal. Moderate bilateral foraminal
stenosis. Mild bilateral facet hypertrophy. This level is unchanged.

L2-L3: Disc desiccation with medium-sized bulge. Moderate bilateral
facet hypertrophy. Moderate spinal canal stenosis has worsened from
the prior study. Moderate bilateral foraminal stenosis is unchanged.

L3-L4: Small disc bulge and severe facet hypertrophy with findings
of posterior decompression. Thecal sac is patent but narrowed
compared to the prior study. Unchanged moderate bilateral foraminal
stenosis.

L4-L5: Posterior decompression and severe facet hypertrophy with
small disc bulge. The thecal sac remains patent but is narrowed
compared to the prior study. Unchanged mild bilateral foraminal
narrowing.

L5-S1: Posterior decompression with patent thecal sac, mildly
narrowed compared to the prior study. No nerve root impingement.
Next healed

S1-S2:  Unremarkable

Visualized sacrum: Normal.
IMPRESSION: 1. Transitional lumbosacral anatomy with lumbarization of the S1
vertebral body. Numbering is preserved from the 08/09/2014
examination.
2. Slightly increased apparent narrowing of the spinal canal at the
posteriorly decompressed levels relative to the prior examination.
However, this appearance may be exaggerated by patient motion during
the axial acquisitions, as the spinal canal patency appears
unchanged on the sagittal sequences.
3. Progression of spinal canal stenosis at L2-L3, now moderate.
Unchanged moderate bilateral foraminal stenosis at this level.
4. Slight progression of height loss of the L3 vertebral body but no
marrow edema. No acute compression fracture.

## 2017-10-12 ENCOUNTER — Telehealth: Payer: Self-pay | Admitting: Cardiology

## 2017-10-12 DIAGNOSIS — G4733 Obstructive sleep apnea (adult) (pediatric): Secondary | ICD-10-CM

## 2017-10-12 NOTE — Telephone Encounter (Signed)
Please get a 2 week auto titration from 4 to 18cm H2O  

## 2017-10-12 NOTE — Telephone Encounter (Signed)
New Message     Patient is calling because her CPAP machine is set to high. She needs it at 5 and its set to 11. Please call.

## 2017-10-12 NOTE — Telephone Encounter (Signed)
Message sent to Dr Mayford Knife to advise me of her pressure change

## 2017-10-13 NOTE — Telephone Encounter (Signed)
Order faxed to [facility] today.

## 2017-10-13 NOTE — Telephone Encounter (Signed)
Please get a 2 week auto titration from 4 to 18cm H2O  

## 2017-10-30 ENCOUNTER — Encounter: Payer: Self-pay | Admitting: Cardiology

## 2017-11-01 ENCOUNTER — Telehealth: Payer: Self-pay | Admitting: *Deleted

## 2017-11-01 ENCOUNTER — Telehealth: Payer: Self-pay | Admitting: Cardiology

## 2017-11-01 DIAGNOSIS — G4733 Obstructive sleep apnea (adult) (pediatric): Secondary | ICD-10-CM

## 2017-11-01 NOTE — Telephone Encounter (Signed)
New Message   Pt states she is waking up with headaches and thinks its coming from the pressure on her cpap machine and would like to get it adjusted. Please call

## 2017-11-01 NOTE — Telephone Encounter (Signed)
Please get a 2 week autotitration from 4 to 14 cm H2o and get download in 2 weeks

## 2017-11-01 NOTE — Telephone Encounter (Signed)
Order faxed to [facility]. 

## 2017-11-01 NOTE — Telephone Encounter (Signed)
Per Dr Mayford Knife order faxed to [facility] for a 2 week autotitration from 4 to 14 cm H2o and get download in 2 weeks

## 2017-11-03 ENCOUNTER — Telehealth: Payer: Self-pay | Admitting: *Deleted

## 2017-11-03 NOTE — Telephone Encounter (Signed)
Patient has a 10 week follow up appointment scheduled for 8/22/ 2019 at 11:40. Patient understands she needs to keep this appointment for insurance compliance. Patient was grateful for the call and thanked me.

## 2017-11-06 ENCOUNTER — Telehealth: Payer: Self-pay | Admitting: *Deleted

## 2017-11-06 NOTE — Telephone Encounter (Signed)
-----   Message from [name], MD sent at 11/02/2017 10:23 AM EDT ----- Good AHI on PAP but needs to improve compliance or insurance will ask patient to send device back,

## 2017-11-06 NOTE — Telephone Encounter (Signed)
Informed patient of compliance results and patient understanding was verbalized. Patient is aware and agreeable to AHI being within range at 2.1. Patient is aware and agreeable to improving her compliance with machine usage Patient states she did not start her machine the day she received it, she actually started her machine 8 days after she got it but she is being compliant everyday now.

## 2017-11-30 ENCOUNTER — Telehealth: Payer: Self-pay | Admitting: *Deleted

## 2017-11-30 NOTE — Telephone Encounter (Signed)
Informed patient of compliance results and verbalized understanding was indicated. Patient is aware and agreeable to AHI being within range at 3.6. Patient is aware and agreeable to being in compliance with machine usage Patient is aware and agreeable to no change in current pressures.

## 2017-11-30 NOTE — Telephone Encounter (Signed)
-----   Message from [name], MD sent at 11/30/2017  2:44 PM EDT ----- Good AHI and compliance.  Continue current PAP settings.

## 2017-12-14 ENCOUNTER — Ambulatory Visit: Payer: Medicare Other | Admitting: Cardiology

## 2017-12-21 ENCOUNTER — Telehealth: Payer: Self-pay | Admitting: *Deleted

## 2017-12-21 ENCOUNTER — Telehealth (HOSPITAL_COMMUNITY): Payer: Self-pay

## 2017-12-21 ENCOUNTER — Ambulatory Visit: Payer: Medicare Other | Admitting: Cardiology

## 2017-12-21 ENCOUNTER — Telehealth: Payer: Self-pay

## 2017-12-21 ENCOUNTER — Encounter: Payer: Self-pay | Admitting: Cardiology

## 2017-12-21 VITALS — BP 134/66 | HR 73 | Ht 68.0 in | Wt 260.8 lb

## 2017-12-21 DIAGNOSIS — E669 Obesity, unspecified: Secondary | ICD-10-CM | POA: Diagnosis not present

## 2017-12-21 DIAGNOSIS — G4733 Obstructive sleep apnea (adult) (pediatric): Secondary | ICD-10-CM

## 2017-12-21 DIAGNOSIS — R072 Precordial pain: Secondary | ICD-10-CM | POA: Diagnosis not present

## 2017-12-21 DIAGNOSIS — N183 Chronic kidney disease, stage 3 unspecified: Secondary | ICD-10-CM

## 2017-12-21 DIAGNOSIS — I129 Hypertensive chronic kidney disease with stage 1 through stage 4 chronic kidney disease, or unspecified chronic kidney disease: Secondary | ICD-10-CM | POA: Diagnosis not present

## 2017-12-21 LAB — TROPONIN T

## 2017-12-21 NOTE — Telephone Encounter (Signed)
-----   Message from Dustin Flock, RN sent at 12/21/2017 12:08 PM EDT ----- Regarding: Sleep supplies Hello,  Change auto CPAP to 11 cm H2O, discontinue in 2 weeks. Thank you.  Romeo Apple

## 2017-12-21 NOTE — Telephone Encounter (Signed)
-----   Message from [name], MD sent at 12/21/2017  3:31 PM EDT ----- Please let patient know that labs were normal.  Continue current medical therapy.

## 2017-12-21 NOTE — Telephone Encounter (Signed)
Order faxed to [facility]. 

## 2017-12-21 NOTE — Patient Instructions (Addendum)
Medication Instructions:  Your physician recommends that you continue on your current medications as directed. Please refer to the Current Medication list given to you today.  Lab: Today: Tropinin I: STAT  Testing/Procedures: Your physician has requested that you have a lexiscan myoview. For further information please visit [email or web address] Please follow instruction sheet, as given.  Follow-Up: Your physician wants you to follow-up in: 1 year with Dr. Mayford Knife. You will receive a reminder letter in the mail two months in advance. If you don't receive a letter, please call our office to schedule the follow-up appointment.  If you need a refill on your cardiac medications before your next appointment, please call your pharmacy.

## 2017-12-21 NOTE — Progress Notes (Signed)
Cardiology Office Note:    Date:  12/21/2017   ID:  Madison [name], DOB [birth year], MRN [id number]  PCP:  [name], MD  Cardiologist:  No primary care provider on file.    Referring MD: [name], MD   Chief Complaint  Patient presents with  . Sleep Apnea  . Hypertension    History of Present Illness:    Madison [name] is a 77 y.o. female with a hx of GERD, hypertension, SVT.  She was diagnosed with obstructive sleep apnea in 2012 and was started on CPAP at 11 cm H2O.  She was then lost to follow-up.  She represented to me a few months ago wanting a new device.  We ordered a new ResMed CPAP and she is now back for office visit to document compliance as required by insurance.  She is doing well with her CPAP device and thinks that she has gotten used to it.  She tolerates the mask and feels the pressure is adequate.  Since going on CPAP she feels rested in the am and has no significant daytime sleepiness.  She denies any significant mouth or nasal dryness or nasal congestion.  She does not think that he snores.     Past Medical History:  Diagnosis Date  . Benign hypertension with chronic kidney disease, stage III (HCC)    Overview:  Last Assessment & Plan:  Usually the patient is hypertensive. However today her blood pressure is 105 systolic. She has been feeling fatigued with some lightheadedness. Part of this may be related to her lower blood pressure. Her pressure may be improved with her decrease in salt and fluid intake. I've instructed her to put her lisinopril/hctz on hold. I've asked her to see her primary   . Cardiac disease 03/10/2014  . Chronic diarrhea 07/27/2015  . Edema 07/27/2015  . Ejection fraction 2004   Normal, echo,   . Gastroesophageal reflux disease   . GERD (gastroesophageal reflux disease)   . Heart disease 03/10/2014  . Hypertension   . Obesity   . Obesity (BMI 30-39.9) 09/25/2017  . OSA (obstructive sleep apnea)    mild with AHI 6.75 - on CPAP  .  Preop cardiovascular exam 11/2010   Cardiac clearance for knee surgery   . Sleep apnea 03/10/2014  . 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 It was possibly reentrant. She does well with diltiazem. No change in therapy.  . 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 Urinary incontinence     Past Surgical History:  Procedure Laterality Date  . CATARACT EXTRACTION Left 8/11  . CATARACT EXTRACTION Right 3/12  . CHOLECYSTECTOMY  1994  . Copsilotomy Laser Treatment Left 6/12   eye  . ELBOW SURGERY  8/12  . 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 EYE SURGERY Right 12/11  . Lumbar Infusion  2011  . MOLE REMOVAL  06/17/2015  . 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 TOTAL KNEE ARTHROPLASTY Right 06/13/2011    Current Medications: Current Meds  Medication Sig  . acetaminophen (TYLENOL) 500 MG tablet Take 1 tablet by mouth as needed for pain.  . Calcium Carbonate (CALCIUM 600 PO) Take by mouth daily.   Marland Kitchen  Cholecalciferol (VITAMIN D3) 2000 UNITS TABS Take 4,000 Units by mouth daily.  Marland Kitchen diltiazem (CARDIZEM CD) 180 MG 24 hr capsule TAKE 1 CAPSULE BY MOUTH EVERY DAY  . fluocinonide cream (LIDEX) 0.05 % Apply 1 application topically as needed (for rash).   . furosemide (LASIX) 20 MG tablet Take 20 mg by mouth 2 (two) times daily.  Marland Kitchen omeprazole (PRILOSEC) 40 MG capsule Take 40 mg by mouth once a week.   . potassium chloride SA (K-DUR,KLOR-CON) 20 MEQ tablet Take 1 tablet (20 mEq total) by mouth daily.  . Prenatal Vit-Fe Fumarate-FA (PRENATAL MULTIVITAMIN) TABS tablet Take 1 tablet by mouth daily at 12 noon.  . WELCHOL 625 MG tablet TAKE 1 TO 2 TABLETS BY MOUTH EVERY DAY AS NEEDED  FOR DIARRHEA     Allergies:   Patient has no known allergies.   Social History   Socioeconomic History  . Marital status: Married    Spouse name: Not on file  . Number of children: Not on file  . Years of education: Not on file  . Highest education level: Not on file  Occupational History  . Not on file  Social Needs  . Financial resource strain: Not on file  . Food insecurity:    Worry: Not on file    Inability: Not on file  . Transportation needs:    Medical: Not on file    Non-medical: Not on file  Tobacco Use  . Smoking status: Former Games developer  . Smokeless tobacco: Never Used  Substance and Sexual Activity  . Alcohol use: No  . Drug use: No  . Sexual activity: Not on file  Lifestyle  . Physical activity:    Days per week: Not on file    Minutes per session: Not on file  . Stress: Not on file  Relationships  . Social connections:    Talks on phone: Not on file    Gets together: Not on file    Attends religious service: Not on file    Active member of club or organization: Not on file    Attends meetings of clubs or organizations: Not on file    Relationship status: Not on file  Other Topics Concern  . Not on file  Social History Narrative  . Not on file     Family History: The patient's family history includes Diabetes in her father and mother; Heart Problems in her father; Heart attack in her father, maternal grandfather, paternal grandmother, and paternal uncle; High blood pressure in her mother; Prostate cancer in her father. There is no history of Stroke.  ROS:   Please see the history of present illness.    ROS  All other systems reviewed and negative.   EKGs/Labs/Other Studies Reviewed:    The following studies were reviewed today: PAP download  EKG:  EKG is not ordered today.    Recent Labs: No results found for requested labs within last 8760 hours.   Recent Lipid Panel No results found for: CHOL, TRIG, HDL, CHOLHDL, VLDL, LDLCALC,  LDLDIRECT  Physical Exam:    VS:  BP 134/66   Pulse 73   Ht 5\' 8"  (1.727 m)   Wt 260 lb 12.8 oz (118.3 kg)   SpO2 97%   BMI 39.65 kg/m     Wt Readings from Last 3 Encounters:  12/21/17 260 lb 12.8 oz (118.3 kg)  09/25/17 254 lb (115.2 kg)  01/26/17 244 lb (110.7 kg)     GEN:  Well nourished,  well developed in no acute distress HEENT: Normal NECK: No JVD; No carotid bruits LYMPHATICS: No lymphadenopathy CARDIAC: RRR, no murmurs, rubs, gallops RESPIRATORY:  Clear to auscultation without rales, wheezing or rhonchi  ABDOMEN: Soft, non-tender, non-distended MUSCULOSKELETAL:  No edema; No deformity  SKIN: Warm and dry NEUROLOGIC:  Alert and oriented x 3 PSYCHIATRIC:  Normal affect   ASSESSMENT:    1. OSA (obstructive sleep apnea)   2. Benign hypertension with chronic kidney disease, stage III (HCC)   3. Obesity (BMI 30-39.9)    PLAN:    In order of problems listed above:  1.  OSA - the patient is tolerating PAP therapy well without any problems. The PAP download was reviewed today and showed an AHI of 2.9/hr on auto PAP with 100% compliance in using more than 4 hours nightly.  The patient has been using and benefiting from PAP use and will continue to benefit from therapy.   2.  HTN -BP is well controlled on exam today.  She will continue on Cardizem CD 180 mg daily.  3.  Obesity - I have encouraged her to get into a routine exercise program and cut back on carbs and portions.    Medication Adjustments/Labs and Tests Ordered: Current medicines are reviewed at length with the patient today.  Concerns regarding medicines are outlined above.  No orders of the defined types were placed in this encounter.  No orders of the defined types were placed in this encounter.   Signed, Armanda Magic, MD  12/21/2017 11:45 AM    [facility]

## 2017-12-21 NOTE — Telephone Encounter (Signed)
Notes recorded by Sigurd Sos, RN on 12/21/2017 at 3:40 PM EDT Spoke to patient, gave her lab result and recommendation. She verbalized understanding and was thankful for the call. ------

## 2017-12-21 NOTE — Telephone Encounter (Signed)
Patient given detailed instructions per Myocardial Perfusion Study Information Sheet for the test on 12/28/2017 at 12:45. Patient notified to arrive 15 minutes early and that it is imperative to arrive on time for appointment to keep from having the test rescheduled.  If you need to cancel or reschedule your appointment, please call the office within 24 hours of your appointment. . Patient verbalized understanding.EHK

## 2017-12-28 ENCOUNTER — Ambulatory Visit (HOSPITAL_COMMUNITY): Payer: Medicare Other

## 2017-12-28 ENCOUNTER — Telehealth: Payer: Self-pay | Admitting: *Deleted

## 2017-12-28 ENCOUNTER — Ambulatory Visit (HOSPITAL_COMMUNITY): Payer: Medicare Other | Attending: Cardiology

## 2017-12-28 DIAGNOSIS — R072 Precordial pain: Secondary | ICD-10-CM

## 2017-12-28 DIAGNOSIS — G4733 Obstructive sleep apnea (adult) (pediatric): Secondary | ICD-10-CM

## 2017-12-28 MED ORDER — REGADENOSON 0.4 MG/5ML IV SOLN
0.4000 mg | Freq: Once | INTRAVENOUS | Status: AC
  Administered 2017-12-28: 0.4 mg via INTRAVENOUS

## 2017-12-28 MED ORDER — TECHNETIUM TC 99M TETROFOSMIN IV KIT
32.4000 | PACK | Freq: Once | INTRAVENOUS | Status: AC | PRN
  Administered 2017-12-28: 32.4 via INTRAVENOUS
  Filled 2017-12-28: qty 33

## 2017-12-28 NOTE — Telephone Encounter (Signed)
Patient called today stating she wants her machine changed back to 7 cm H20 from the current pressure of 11 cm  it was set on when she got the new dream station. I have not been able to find it anywhere in your documentation where she was on 7 cm. Patient complains the machine starts blowing hard the minute she cuts it on and she is unable to wear it. Sent to dr Mayford Knife for advisement

## 2017-12-29 ENCOUNTER — Ambulatory Visit (HOSPITAL_COMMUNITY): Payer: Medicare Other

## 2017-12-29 ENCOUNTER — Encounter (HOSPITAL_COMMUNITY): Payer: Self-pay

## 2018-01-01 NOTE — Telephone Encounter (Signed)
  [name], MD  Reesa Chew, CMA        Please place on auto PAP from 5 to 20cm H2O and get a download in 2 weeks   Armanda Magic

## 2018-01-01 NOTE — Addendum Note (Signed)
Addended by: Reesa Chew on: 01/01/2018 03:44 PM   Modules accepted: Orders

## 2018-01-04 ENCOUNTER — Ambulatory Visit (HOSPITAL_COMMUNITY): Payer: Medicare Other | Attending: Cardiovascular Disease

## 2018-01-04 LAB — MYOCARDIAL PERFUSION IMAGING
CHL CUP NUCLEAR SRS: 2
CHL CUP NUCLEAR SSS: 0
CHL CUP RESTING HR STRESS: 69 {beats}/min
LV dias vol: 83 mL (ref 46–106)
LVSYSVOL: 28 mL
Peak HR: 90 {beats}/min
SDS: 0
TID: 0.96

## 2018-01-04 MED ORDER — TECHNETIUM TC 99M TETROFOSMIN IV KIT
32.4000 | PACK | Freq: Once | INTRAVENOUS | Status: AC | PRN
  Administered 2018-01-04: 32.4 via INTRAVENOUS
  Filled 2018-01-04: qty 33

## 2018-02-14 ENCOUNTER — Other Ambulatory Visit: Payer: Self-pay | Admitting: Cardiology

## 2018-03-14 ENCOUNTER — Other Ambulatory Visit: Payer: Self-pay | Admitting: Cardiology

## 2018-03-29 ENCOUNTER — Ambulatory Visit: Payer: Medicare Other | Admitting: Cardiology

## 2018-03-29 ENCOUNTER — Encounter (INDEPENDENT_AMBULATORY_CARE_PROVIDER_SITE_OTHER): Payer: Self-pay

## 2018-03-29 ENCOUNTER — Encounter: Payer: Self-pay | Admitting: Cardiology

## 2018-03-29 VITALS — BP 126/70 | HR 67 | Ht 68.0 in | Wt 275.0 lb

## 2018-03-29 DIAGNOSIS — R600 Localized edema: Secondary | ICD-10-CM | POA: Diagnosis not present

## 2018-03-29 DIAGNOSIS — N949 Unspecified condition associated with female genital organs and menstrual cycle: Secondary | ICD-10-CM | POA: Diagnosis not present

## 2018-03-29 DIAGNOSIS — I471 Supraventricular tachycardia: Secondary | ICD-10-CM

## 2018-03-29 DIAGNOSIS — I10 Essential (primary) hypertension: Secondary | ICD-10-CM

## 2018-03-29 LAB — CBC WITH DIFFERENTIAL/PLATELET
Basophils Absolute: 0 10*3/uL (ref 0.0–0.2)
Basos: 0 %
EOS (ABSOLUTE): 0.1 10*3/uL (ref 0.0–0.4)
Eos: 1 %
Hematocrit: 39.1 % (ref 34.0–46.6)
Hemoglobin: 12.8 g/dL (ref 11.1–15.9)
Immature Grans (Abs): 0 10*3/uL (ref 0.0–0.1)
Immature Granulocytes: 0 %
Lymphocytes Absolute: 1.7 10*3/uL (ref 0.7–3.1)
Lymphs: 22 %
MCH: 26.5 pg — ABNORMAL LOW (ref 26.6–33.0)
MCHC: 32.7 g/dL (ref 31.5–35.7)
MCV: 81 fL (ref 79–97)
Monocytes Absolute: 0.6 10*3/uL (ref 0.1–0.9)
Monocytes: 8 %
Neutrophils Absolute: 5.3 10*3/uL (ref 1.4–7.0)
Neutrophils: 69 %
Platelets: 383 10*3/uL (ref 150–450)
RBC: 4.83 x10E6/uL (ref 3.77–5.28)
RDW: 13.1 % (ref 12.3–15.4)
WBC: 7.7 10*3/uL (ref 3.4–10.8)

## 2018-03-29 LAB — PRO B NATRIURETIC PEPTIDE: NT-Pro BNP: 278 pg/mL (ref 0–738)

## 2018-03-29 LAB — TSH: TSH: 4.52 u[IU]/mL — ABNORMAL HIGH (ref 0.450–4.500)

## 2018-03-29 NOTE — Patient Instructions (Signed)
Medication Instructions:   Your physician recommends that you continue on your current medications as directed. Please refer to the Current Medication list given to you today.  If you need a refill on your cardiac medications before your next appointment, please call your pharmacy.     Lab work:  TODAY--BMET, CBC W DIFF, TSH, AND PRO-BNP  If you have labs (blood work) drawn today and your tests are completely normal, you will receive your results only by: Marland Kitchen MyChart Message (if you have MyChart) OR . A paper copy in the mail If you have any lab test that is abnormal or we need to change your treatment, we will call you to review the results.      You have been referred to DR. Renaldo Fiddler OBGYN AT [facility] [place], [place] [phone]      Follow-Up: At [facility], you and your health needs are our priority.  As part of our continuing mission to provide you with exceptional heart care, we have created designated Provider Care Teams.  These Care Teams include your primary Cardiologist (physician) and Advanced Practice Providers (APPs -  Physician Assistants and Nurse Practitioners) who all work together to provide you with the care you need, when you need it. You will need a follow up appointment in 6 months.  Please call our office 2 months in advance to schedule this appointment.  You may see Tobias Alexander, MD or one of the following Advanced Practice Providers on your designated Care Team:   [place], PA-C Ronie Spies, PA-C . Jacolyn Reedy, PA-C

## 2018-03-29 NOTE — Progress Notes (Signed)
Cardiology Office Note    Date:  03/29/2018   ID:  Madison [name], DOB [birth year], MRN [id number]  PCP:  [name], MD  Cardiologist:  Dr Myrtis Ser --> Tobias Alexander, MD   Chief complaint/reason for visit: One-year follow-up, lower extremity edema  History of Present Illness:  Madison [name] is a 77 y.o. female who has been previously followed by Dr. Myrtis Ser for lower extremity edema, improved with low-dose of Lasix and salt restriction, she also has history of SVT with heart rate about 200, well controlled on diltiazem. She hasn't had an episode in the last 15 years. 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 Her kidney function has improved with change in her medication and increase of hydration. She continues to take Lasix 20 mg daily. She denies any chest pain, walks with a walker secondary to her orthopedic problem and obesity. She underwent an echocardiogram a year ago that showed normal LVEF and grade 1*dysfunction, no significant valvular abnormalities.  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 She is waiting for hip replacement. Surgeon insisted that she has to lose minimum of 40 pounds before he would consider for surgery. She has lost 45 pounds in the last 6 months. She brings preop clearance paper with her to be sign. She denies any chest pain no shortness of breath no lower extremity edema orthopnea proximal nocturnal dyspnea she has noticed some swelling in her fingers but her readings being tight. Otherwise denies palpitations dizziness or syncope.  03/29/2018 this is 1 year follow-up, the patient has been stable, she underwent right hip replacement a year ago and still undergoing physical therapy.  She is minimally active, walks with a walker.  She has been having chronic pitting mild lower extremity edema bilaterally since her hip  surgery.  She denies any orthopnea proximal nocturnal dyspnea, she has no chest pain no shortness of breath and no palpitations.  She has been compliant with her meds.  Past Medical History:  Diagnosis Date  . Benign hypertension with chronic kidney disease, stage III (HCC)    Overview:  Last Assessment & Plan:  Usually the patient is hypertensive. However today her blood pressure is 105 systolic. She has been feeling fatigued with some lightheadedness. Part of this may be related to her lower blood pressure. Her pressure may be improved with her decrease in salt and fluid intake. I've instructed her to put her lisinopril/hctz on hold. I've asked her to see her primary   . Cardiac disease 03/10/2014  . Chronic diarrhea 07/27/2015  . Edema 07/27/2015  . Ejection fraction 2004   Normal, echo,   . Gastroesophageal reflux disease   . GERD (gastroesophageal reflux disease)   . Heart disease 03/10/2014  . Hypertension   . Obesity   . Obesity (BMI 30-39.9) 09/25/2017  . OSA (obstructive sleep apnea)    mild with AHI 6.75 - on CPAP  . Preop cardiovascular exam 11/2010   Cardiac clearance for knee surgery   . Sleep apnea 03/10/2014  . 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 It was possibly reentrant. She does well with  diltiazem. No change in therapy.  . 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 Urinary incontinence    Past Surgical History:  Procedure Laterality Date  . CATARACT EXTRACTION Left 8/11  . CATARACT EXTRACTION Right 3/12  . CHOLECYSTECTOMY  1994  . Copsilotomy Laser Treatment Left 6/12   eye  . ELBOW SURGERY  8/12  . 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 EYE  SURGERY Right 12/11  . Lumbar Infusion  2011  . MOLE REMOVAL  06/17/2015  . 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 acetaminophen (TYLENOL) 500 MG tablet Take 1 tablet by mouth as needed for pain.    . Calcium Carbonate (CALCIUM 600 PO) Take by mouth daily.     . Cholecalciferol (VITAMIN D3) 2000 UNITS TABS Take 4,000 Units by mouth daily.    Marland Kitchen diltiazem (CARDIZEM CD) 180 MG 24 hr capsule TAKE 1 CAPSULE BY MOUTH EVERY DAY. Please keep upcoming appt in December with Dr. Delton See before anymore refills. Thank you 90 capsule 0  . fluocinonide cream (LIDEX) 0.05 % Apply 1 application topically as needed (for rash).     . furosemide (LASIX) 20 MG tablet Take 20 mg by mouth 2 (two) times daily.    Marland Kitchen KLOR-CON M20 20 MEQ tablet TAKE 1 TABLET BY MOUTH EVERY DAY 90 tablet 2  . omeprazole (PRILOSEC) 40 MG capsule Take 40 mg by mouth once a week.   3  . Prenatal Vit-Fe Fumarate-FA (PRENATAL MULTIVITAMIN) TABS tablet Take 1 tablet by mouth daily at 12 noon.    . WELCHOL 625 MG tablet TAKE 1 TO 2 TABLETS BY MOUTH EVERY DAY AS NEEDED FOR DIARRHEA  1   No facility-administered medications prior to visit.     Allergies:   Patient has no known allergies.   Social History   Socioeconomic History  . Marital status: Married    Spouse name: Not on file  . Number of children: Not on file  . Years of education: Not on file  . Highest education level: Not on file  Occupational History  . Not on file  Social Needs  . Financial resource strain: Not on file  . Food insecurity:    Worry: Not on file    Inability: Not on file  . Transportation needs:    Medical: Not on file    Non-medical: Not on file  Tobacco Use  . Smoking status: Former Games developer  . Smokeless tobacco: Never Used  Substance and Sexual Activity  . Alcohol use: No  . Drug use: No  . Sexual activity: Not  on file  Lifestyle  . Physical activity:    Days per week: Not on file    Minutes per session: Not on file  . Stress: Not on file  Relationships  . Social connections:    Talks on phone: Not on file    Gets together: Not on file    Attends religious service: Not on file    Active member of club or organization: Not on file    Attends meetings of clubs or organizations: Not on file    Relationship status: Not on file  Other Topics Concern  . Not on file  Social History Narrative  . Not on file    Family History:  The patient's family history  includes Diabetes in her father and mother; Heart Problems in her father; Heart attack in her father, maternal grandfather, paternal grandmother, and paternal uncle; High blood pressure in her mother; Prostate cancer in her father.   ROS:   Please see the history of present illness.    ROS All other systems reviewed and are negative.  PHYSICAL EXAM:   VS:  BP 126/70   Pulse 67   Ht 5\' 8"  (1.727 m)   Wt 275 lb (124.7 kg)   SpO2 98%   BMI 41.81 kg/m    GEN: Well nourished, well developed, in no acute distress  HEENT: normal  Neck: no JVD, carotid bruits, or masses Cardiac: RRR; no murmurs, rubs, or gallops,no edema  Respiratory:  clear to auscultation bilaterally, normal work of breathing GI: soft, nontender, nondistended, + BS MS: no deformity or atrophy  Skin: warm and dry, no rash Neuro:  Alert and Oriented x 3, Strength and sensation are intact Psych: euthymic mood, full affect  Wt Readings from Last 3 Encounters:  03/29/18 275 lb (124.7 kg)  12/28/17 260 lb (117.9 kg)  12/21/17 260 lb 12.8 oz (118.3 kg)    Studies/Labs Reviewed:   EKG:  EKG is ordered todayAnd personally reviewed, shows sinus rhythm normal EKG unchanged from prior.  TTE: 10/2014 - Left ventricle: The cavity size was normal. Wall thickness was   normal. Systolic function was normal.  - LVEF 55% to 60%. Wall motion was normal; - abnormal left ventricular  relaxation (grade 1 diastolic dysfunction). - Atrial septum: There was an atrial septal aneurysm. - Pulmonary arteries: Systolic pressure was mildly increased. PA   peak pressure: 34 mm Hg (S). Impressions: - Normal LV function; grade 1 diastolic dysfunction; trace MR and   TR; mildly elevated pulmonary pressure.  Recent Labs: No results found for requested labs within last 8760 hours.   Lipid Panel No results found for: CHOL, TRIG, HDL, CHOLHDL, VLDL, LDLCALC, LDLDIRECT    ASSESSMENT:    1. Bilateral edema of lower extremity   2. Vaginal discomfort     PLAN:  In order of problems listed above:  1. Bilateral lower extremity edema -her edema got worse, I will obtain her BMP and BNP today as we do not have any in several years and increase Lasix to 40 mg p.o. twice daily based on lab results. 2. Hypertension -well controlled 3. No recurrent SVT I would continue same dose of diltiazem  Medication Adjustments/Labs and Tests Ordered: Current medicines are reviewed at length with the patient today.  Concerns regarding medicines are outlined above.  Medication changes, Labs and Tests ordered today are listed in the Patient Instructions below. Patient Instructions  Medication Instructions:   Your physician recommends that you continue on your current medications as directed. Please refer to the Current Medication list given to you today.  If you need a refill on your cardiac medications before your next appointment, please call your pharmacy.     Lab work:  TODAY--BMET, CBC W DIFF, TSH, AND PRO-BNP  If you have labs (blood work) drawn today and your tests are completely normal, you will receive your results only by: Marland Kitchen MyChart Message (if you have MyChart) OR . A paper copy in the mail If you have any lab test that is abnormal or we need to change your treatment, we will call you to review the results.      You have been referred to DR. Renaldo Fiddler OBGYN AT [facility]  [place], [place] [phone]      Follow-Up: At [facility], you and your health needs are our priority.  As part of our continuing mission to provide you with exceptional heart care, we have created designated Provider Care Teams.  These Care Teams include your primary Cardiologist (physician) and Advanced Practice Providers (APPs -  Physician Assistants and Nurse Practitioners) who all work together to provide you with the care you need, when you need it. You will need a follow up appointment in 6 months.  Please call our office 2 months in advance to schedule this appointment.  You may see Tobias Alexander, MD or one of the following Advanced Practice Providers on your designated Care Team:   [place], PA-C Ronie Spies, PA-C . Jacolyn Reedy, PA-C        Signed, Tobias Alexander, MD  03/29/2018 10:13 AM    [facility] [address] [place], [place], [place]  [address] Phone: [phone]; Fax: [phone]

## 2018-04-04 ENCOUNTER — Telehealth: Payer: Self-pay | Admitting: *Deleted

## 2018-04-04 NOTE — Telephone Encounter (Signed)
AMB REFERRAL TO OB-GYN  Received: Today  Message Contents  Edgar Frisk, LPN        Referral faxed to Dr Renaldo Fiddler' office

## 2018-04-24 ENCOUNTER — Telehealth: Payer: Self-pay | Admitting: *Deleted

## 2018-04-24 NOTE — Telephone Encounter (Signed)
AMB REFERRAL TO OB-GYN  Received: Yesterday  Message Contents  Edgar Frisk, LPN        Appt made with Dr Zelphia Cairo on 04-16-18 at 10:30

## 2018-05-13 ENCOUNTER — Other Ambulatory Visit: Payer: Self-pay | Admitting: Cardiology

## 2018-06-28 DIAGNOSIS — R8290 Unspecified abnormal findings in urine: Secondary | ICD-10-CM | POA: Insufficient documentation

## 2018-06-28 DIAGNOSIS — N390 Urinary tract infection, site not specified: Secondary | ICD-10-CM | POA: Insufficient documentation

## 2018-06-28 DIAGNOSIS — B9620 Unspecified Escherichia coli [E. coli] as the cause of diseases classified elsewhere: Secondary | ICD-10-CM | POA: Insufficient documentation

## 2018-10-31 ENCOUNTER — Telehealth: Payer: Self-pay | Admitting: *Deleted

## 2018-10-31 NOTE — Telephone Encounter (Signed)
.      COVID-19 Pre-Screening Questions:  . In the past 7 to 10 days have you had a cough,  shortness of breath, headache, congestion, fever (100 or greater) body aches, chills, sore throat, or sudden loss of taste or sense of smell?NO . Have you been around anyone with known Covid 19.NO . Have you been around anyone who is awaiting Covid 19 test results in the past 7 to 10 days?NO . Have you been around anyone who has been exposed to Covid 19, or has mentioned symptoms of Covid 19 within the past 7 to 10 days?NO   Pt covid pre-screen questions were done and answered "no" to all.  Pt is aware of no visitor policy and to wear her face mask at all times to this appt.  Pt aware not to arrive more that 15 minutes prior to appointment time Pt verbalized understanding and agrees with this plan   If you have any concerns/questions about symptoms patients report during screening (either on the phone or at threshold). Contact the provider seeing the patient or DOD for further guidance.  If neither are available contact a member of the leadership team.            

## 2018-11-01 ENCOUNTER — Other Ambulatory Visit: Payer: Self-pay | Admitting: Cardiology

## 2018-11-01 DIAGNOSIS — N3000 Acute cystitis without hematuria: Secondary | ICD-10-CM | POA: Insufficient documentation

## 2018-11-05 ENCOUNTER — Other Ambulatory Visit: Payer: Self-pay

## 2018-11-05 ENCOUNTER — Encounter: Payer: Self-pay | Admitting: Cardiology

## 2018-11-05 ENCOUNTER — Ambulatory Visit (INDEPENDENT_AMBULATORY_CARE_PROVIDER_SITE_OTHER): Payer: Medicare Other | Admitting: Cardiology

## 2018-11-05 VITALS — HR 78 | Ht 68.0 in | Wt 284.8 lb

## 2018-11-05 DIAGNOSIS — R072 Precordial pain: Secondary | ICD-10-CM | POA: Diagnosis not present

## 2018-11-05 DIAGNOSIS — R600 Localized edema: Secondary | ICD-10-CM | POA: Diagnosis not present

## 2018-11-05 DIAGNOSIS — I471 Supraventricular tachycardia: Secondary | ICD-10-CM | POA: Diagnosis not present

## 2018-11-05 NOTE — Patient Instructions (Signed)
Medication Instructions:   Your physician recommends that you continue on your current medications as directed. Please refer to the Current Medication list given to you today.  If you need a refill on your cardiac medications before your next appointment, please call your pharmacy.      Follow-Up: At [facility], you and your health needs are our priority.  As part of our continuing mission to provide you with exceptional heart care, we have created designated Provider Care Teams.  These Care Teams include your primary Cardiologist (physician) and Advanced Practice Providers (APPs -  Physician Assistants and Nurse Practitioners) who all work together to provide you with the care you need, when you need it. You will need a follow up appointment in 12 months.  Please call our office 2 months in advance to schedule this appointment.  You may see Katarina Nelson, MD or one of the following Advanced Practice Providers on your designated Care Team:   Brittainy Simmons, PA-C Dayna Dunn, PA-C . Michele Lenze, PA-C     

## 2018-11-05 NOTE — Progress Notes (Signed)
Cardiology Office Note    Date:  11/05/2018   ID:  KEZIAH AVIS, DOB 1941-01-03, MRN [id number]  PCP:  [name], MD  Cardiologist:  Dr Myrtis Ser --> Tobias Alexander, MD   Chief complaint/reason for visit: lower extremity edema  History of Present Illness:  Madison [name] is a 78 y.o. female who has been previously followed by Dr. Myrtis Ser for lower extremity edema, improved with low-dose of Lasix and salt restriction, she also has history of SVT with heart rate about 200, well controlled on diltiazem. She hasn't had an episode in the last 15 years. 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 Her kidney function has improved with change in her medication and increase of hydration. She continues to take Lasix 20 mg daily. She denies any chest pain, walks with a walker secondary to her orthopedic problem and obesity. She underwent an echocardiogram a year ago that showed normal LVEF and grade 1*dysfunction, no significant valvular abnormalities.  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 She is waiting for hip replacement. Surgeon insisted that she has to lose minimum of 40 pounds before he would consider for surgery. She has lost 45 pounds in the last 6 months. She brings preop clearance paper with her to be sign. She denies any chest pain no shortness of breath no lower extremity edema orthopnea proximal nocturnal dyspnea she has noticed some swelling in her fingers but her readings being tight. Otherwise denies palpitations dizziness or syncope.  03/29/2018 this is 1 year follow-up, the patient has been stable, she underwent right hip replacement a year ago and still undergoing physical therapy.  She is minimally active, walks with a walker.  She has been having chronic pitting mild lower extremity edema bilaterally since her hip surgery.  She denies  any orthopnea proximal nocturnal dyspnea, she has no chest pain no shortness of breath and no palpitations.  She has been compliant with her meds.  11/05/2018 -this is 6 months follow-up, the patient continues to gain weight and have lower extremity edema, she was recently seen by her nephrologist and her Lasix was increased from 20 mg p.o. twice daily to 60 mg p.o. twice daily.  Her lower extremity edema is only mild, she denies any orthopnea proximal nocturnal dyspnea.  She has no chest pain, she feels very fatigue and unable to walk to the end of the hallway.  She does not exercise at all and attributes her deconditioning to lack of visiting physical therapy.   Past Medical History:  Diagnosis Date  . Benign hypertension with chronic kidney disease, stage III (HCC)    Overview:  Last Assessment & Plan:  Usually the patient is hypertensive. However today her blood pressure is 105 systolic. She has been feeling fatigued with some lightheadedness. Part of this may be related to her lower blood pressure. Her pressure may be improved with her decrease in salt and fluid intake. I've instructed her to put her lisinopril/hctz on hold. I've asked her to see her primary   . Cardiac disease 03/10/2014  . Chronic diarrhea 07/27/2015  . Edema 07/27/2015  . Ejection fraction 2004   Normal, echo,   . Gastroesophageal reflux disease   . GERD (gastroesophageal reflux disease)   . Heart disease 03/10/2014  . Hypertension   . Obesity   . Obesity (BMI  30-39.9) 09/25/2017  . OSA (obstructive sleep apnea)    mild with AHI 6.75 - on CPAP  . Preop cardiovascular exam 11/2010   Cardiac clearance for knee surgery   . Sleep apnea 03/10/2014  . Supraventricular tachycardia (HCC)    Documented episode in the past, possibly reentrant tachycardia  Overview:  Overview:  Documented episode in the past, possibly reentrant tachycardia  Last Assessment & Plan:  The patient had a documented episode in the past of a supraventricular  tachycardia. It was possibly reentrant. She does well with diltiazem. No change in therapy.  . 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 Urinary incontinence    Past Surgical History:  Procedure Laterality Date  . CATARACT EXTRACTION Left 8/11  . CATARACT EXTRACTION Right 3/12  . CHOLECYSTECTOMY  1994  . Copsilotomy Laser Treatment Left 6/12   eye  . ELBOW SURGERY  8/12  . 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 EYE SURGERY Right 12/11  . Lumbar Infusion  2011  . MOLE REMOVAL  06/17/2015  . 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 acetaminophen (TYLENOL) 500 MG tablet Take 1 tablet by mouth as needed for pain.    . Calcium Carbonate (CALCIUM 600 PO) Take by mouth daily.     . Cholecalciferol (VITAMIN D3) 2000 UNITS TABS Take 4,000 Units by mouth daily.    Marland Kitchen diltiazem (CARDIZEM CD) 180 MG 24 hr capsule TAKE 1 CAPSULE BY MOUTH EVERY DAY 90 capsule 2  . fluocinonide cream (LIDEX) 0.05 % Apply 1 application topically as needed (for rash).     . furosemide (LASIX) 20 MG tablet Take 60 mg by mouth 2 (two) times daily.     Marland Kitchen gabapentin (NEURONTIN) 300 MG capsule Take 300 mg by mouth as needed.    Marland Kitchen KLOR-CON M20 20 MEQ tablet TAKE 1 TABLET BY MOUTH EVERY DAY 90 tablet 2  . omeprazole (PRILOSEC) 40 MG capsule Take 40 mg by mouth once a week.   3  . Prenatal Vit-Fe Fumarate-FA (PRENATAL MULTIVITAMIN) TABS tablet Take 1 tablet by mouth daily at 12 noon.    . WELCHOL 625 MG tablet TAKE 1 TO 2 TABLETS BY MOUTH EVERY DAY AS NEEDED FOR DIARRHEA  1   No facility-administered medications prior to visit.     Allergies:   Patient has no known allergies.   Social History   Socioeconomic History  . Marital status: Married    Spouse name: Not on file  . Number of children: Not on file  . Years  of education: Not on file  . Highest education level: Not on file  Occupational History  . Not on file  Social Needs  . Financial resource strain: Not on file  . Food insecurity    Worry: Not on file    Inability: Not on file  . Transportation needs    Medical: Not on file    Non-medical: Not on file  Tobacco Use  . Smoking status: Former Games developer  . Smokeless tobacco: Never Used  Substance and Sexual Activity  . Alcohol use: No  . Drug use: No  . Sexual activity: Not on file  Lifestyle  . Physical activity    Days per week: Not on file    Minutes per session: Not on file  .  Stress: Not on file  Relationships  . Social Musicianconnections    Talks on phone: Not on file    Gets together: Not on file    Attends religious service: Not on file    Active member of club or organization: Not on file    Attends meetings of clubs or organizations: Not on file    Relationship status: Not on file  Other Topics Concern  . Not on file  Social History Narrative  . Not on file    Family History:  The patient's family history includes Diabetes in her father and mother; Heart Problems in her father; Heart attack in her father, maternal grandfather, paternal grandmother, and paternal uncle; High blood pressure in her mother; Prostate cancer in her father.   ROS:   Please see the history of present illness.    ROS All other systems reviewed and are negative.  PHYSICAL EXAM:   VS:  Pulse 78   Ht 5\' 8"  (1.727 m)   Wt 284 lb 12.8 oz (129.2 kg)   SpO2 96%   BMI 43.30 kg/m    GEN: Well nourished, well developed, in no acute distress  HEENT: normal  Neck: no JVD, carotid bruits, or masses Cardiac: RRR; no murmurs, rubs, or gallops,no edema  Respiratory:  clear to auscultation bilaterally, normal work of breathing GI: soft, nontender, nondistended, + BS MS: no deformity or atrophy  Skin: warm and dry, no rash Neuro:  Alert and Oriented x 3, Strength and sensation are intact Psych: euthymic  mood, full affect  Wt Readings from Last 3 Encounters:  11/05/18 284 lb 12.8 oz (129.2 kg)  03/29/18 275 lb (124.7 kg)  12/28/17 260 lb (117.9 kg)    Studies/Labs Reviewed:   EKG:  EKG is ordered today, it shows normal sinus rhythm otherwise normal EKG unchanged from prior, this was personally reviewed.  TTE: 10/2014 - Left ventricle: The cavity size was normal. Wall thickness was   normal. Systolic function was normal.  - LVEF 55% to 60%. Wall motion was normal; - abnormal left ventricular relaxation (grade 1 diastolic dysfunction). - Atrial septum: There was an atrial septal aneurysm. - Pulmonary arteries: Systolic pressure was mildly increased. PA   peak pressure: 34 mm Hg (S). Impressions: - Normal LV function; grade 1 diastolic dysfunction; trace MR and   TR; mildly elevated pulmonary pressure.  Recent Labs: 03/29/2018: Hemoglobin 12.8; NT-Pro BNP 278; Platelets 383; TSH 4.520   Lipid Panel No results found for: CHOL, TRIG, HDL, CHOLHDL, VLDL, LDLCALC, LDLDIRECT    ASSESSMENT:    1. Bilateral edema of lower extremity   2. Precordial pain   3. Supraventricular tachycardia (HCC)   4. Morbid obesity (HCC)     PLAN:  In order of problems listed above:  1. Bilateral lower extremity edema -the patient has only minimal edema, her Lasix was increased to 60 mg p.o. twice daily by her nephrologist, at the last visit BNP was normal, in few months she has gained 24 pounds, this is related to physical inactivity and obesity.  Encouraged to start cooking for herself and walk every day.   2. Hypertension -well controlled 3. No recurrent SVT I would continue same dose of diltiazem 4. Morbid obesity -explained risks, and necessity to exercise daily.  Medication Adjustments/Labs and Tests Ordered: Current medicines are reviewed at length with the patient today.  Concerns regarding medicines are outlined above.  Medication changes, Labs and Tests ordered today are listed in the  Patient Instructions  below. Patient Instructions  Medication Instructions:   Your physician recommends that you continue on your current medications as directed. Please refer to the Current Medication list given to you today.  If you need a refill on your cardiac medications before your next appointment, please call your pharmacy.     Follow-Up: At [facility], you and your health needs are our priority.  As part of our continuing mission to provide you with exceptional heart care, we have created designated Provider Care Teams.  These Care Teams include your primary Cardiologist (physician) and Advanced Practice Providers (APPs -  Physician Assistants and Nurse Practitioners) who all work together to provide you with the care you need, when you need it. You will need a follow up appointment in 12 months.  Please call our office 2 months in advance to schedule this appointment.  You may see Ena Dawley, MD or one of the following Advanced Practice Providers on your designated Care Team:   [place], PA-C Melina Copa, PA-C . Ermalinda Barrios, PA-C        Signed, Ena Dawley, MD  11/05/2018 3:29 PM    [facility] [place], [place], [place]  [address] Phone: [phone]; Fax: [phone]

## 2018-12-17 ENCOUNTER — Encounter: Payer: Self-pay | Admitting: Cardiology

## 2018-12-30 ENCOUNTER — Encounter

## 2019-01-14 ENCOUNTER — Ambulatory Visit: Payer: Medicare Other | Admitting: Cardiology

## 2019-01-22 DIAGNOSIS — H02883 Meibomian gland dysfunction of right eye, unspecified eyelid: Secondary | ICD-10-CM | POA: Insufficient documentation

## 2019-01-22 DIAGNOSIS — Z961 Presence of intraocular lens: Secondary | ICD-10-CM | POA: Insufficient documentation

## 2019-01-22 DIAGNOSIS — H43393 Other vitreous opacities, bilateral: Secondary | ICD-10-CM | POA: Insufficient documentation

## 2019-01-22 DIAGNOSIS — H04123 Dry eye syndrome of bilateral lacrimal glands: Secondary | ICD-10-CM | POA: Insufficient documentation

## 2019-01-22 DIAGNOSIS — H35373 Puckering of macula, bilateral: Secondary | ICD-10-CM | POA: Insufficient documentation

## 2019-01-22 DIAGNOSIS — H01009 Unspecified blepharitis unspecified eye, unspecified eyelid: Secondary | ICD-10-CM | POA: Insufficient documentation

## 2019-01-22 DIAGNOSIS — H02831 Dermatochalasis of right upper eyelid: Secondary | ICD-10-CM | POA: Insufficient documentation

## 2019-01-22 DIAGNOSIS — H52203 Unspecified astigmatism, bilateral: Secondary | ICD-10-CM | POA: Insufficient documentation

## 2019-01-25 ENCOUNTER — Ambulatory Visit: Payer: Medicare Other | Admitting: Cardiology

## 2019-01-29 ENCOUNTER — Telehealth: Payer: Self-pay

## 2019-01-29 NOTE — Telephone Encounter (Signed)
I called and left patient a message to verify medications and get consent for visit tomorrow.

## 2019-01-30 ENCOUNTER — Other Ambulatory Visit: Payer: Self-pay

## 2019-01-30 ENCOUNTER — Encounter: Payer: Self-pay | Admitting: Physician Assistant

## 2019-01-30 ENCOUNTER — Telehealth (INDEPENDENT_AMBULATORY_CARE_PROVIDER_SITE_OTHER): Payer: Medicare Other | Admitting: Physician Assistant

## 2019-01-30 ENCOUNTER — Telehealth: Payer: Self-pay | Admitting: *Deleted

## 2019-01-30 VITALS — BP 128/75 | HR 85 | Temp 98.0°F | Ht 67.5 in | Wt 276.0 lb

## 2019-01-30 DIAGNOSIS — I10 Essential (primary) hypertension: Secondary | ICD-10-CM

## 2019-01-30 DIAGNOSIS — N1831 Chronic kidney disease, stage 3a: Secondary | ICD-10-CM | POA: Insufficient documentation

## 2019-01-30 DIAGNOSIS — N189 Chronic kidney disease, unspecified: Secondary | ICD-10-CM | POA: Insufficient documentation

## 2019-01-30 DIAGNOSIS — R600 Localized edema: Secondary | ICD-10-CM

## 2019-01-30 DIAGNOSIS — N183 Chronic kidney disease, stage 3 unspecified: Secondary | ICD-10-CM | POA: Insufficient documentation

## 2019-01-30 DIAGNOSIS — I471 Supraventricular tachycardia: Secondary | ICD-10-CM

## 2019-01-30 DIAGNOSIS — N184 Chronic kidney disease, stage 4 (severe): Secondary | ICD-10-CM | POA: Insufficient documentation

## 2019-01-30 DIAGNOSIS — E669 Obesity, unspecified: Secondary | ICD-10-CM

## 2019-01-30 MED ORDER — DILTIAZEM HCL ER COATED BEADS 180 MG PO CP24: ORAL_CAPSULE | ORAL | 3 refills | Status: DC

## 2019-01-30 NOTE — Telephone Encounter (Signed)
.   Virtual Visit Pre-Appointment Phone Call  "(Name), I am calling you today to discuss your upcoming appointment. We are currently trying to limit exposure to the virus that causes COVID-19 by seeing patients at home rather than in the office."  1. "What is the BEST phone number to call the day of the visit?" - include this in appointment notes  2. "Do you have or have access to (through a family member/friend) a smartphone with video capability that we can use for your visit?" a. If yes - list this number in appt notes as "cell" (if different from BEST phone #) and list the appointment type as a VIDEO visit in appointment notes b. If no - list the appointment type as a PHONE visit in appointment notes  3. Confirm consent - "In the setting of the current Covid19 crisis, you are scheduled for a (phone or video) visit with your provider on (date) at (time).  Just as we do with many in-office visits, in order for you to participate in this visit, we must obtain consent.  If you'd like, I can send this to your mychart (if signed up) or email for you to review.  Otherwise, I can obtain your verbal consent now.  All virtual visits are billed to your insurance company just like a normal visit would be.  By agreeing to a virtual visit, we'd like you to understand that the technology does not allow for your provider to perform an examination, and thus may limit your provider's ability to fully assess your condition. If your provider identifies any concerns that need to be evaluated in person, we will make arrangements to do so.  Finally, though the technology is pretty good, we cannot assure that it will always work on either your or our end, and in the setting of a video visit, we may have to convert it to a phone-only visit.  In either situation, we cannot ensure that we have a secure connection.  Are you willing to proceed?" STAFF: Did the patient verbally acknowledge consent to telehealth visit? Document  YES/NO here: YES  4. Advise patient to be prepared - "Two hours prior to your appointment, go ahead and check your blood pressure, pulse, oxygen saturation, and your weight (if you have the equipment to check those) and write them all down. When your visit starts, your provider will ask you for this information. If you have an Apple Watch or Kardia device, please plan to have heart rate information ready on the day of your appointment. Please have a pen and paper handy nearby the day of the visit as well."  5. Give patient instructions for MyChart download to smartphone OR Doximity/Doxy.me as below if video visit (depending on what platform provider is using)  6. Inform patient they will receive a phone call 15 minutes prior to their appointment time (may be from unknown caller ID) so they should be prepared to answer    [place] has been deemed a candidate for a follow-up tele-health visit to limit community exposure during the Covid-19 pandemic. I spoke with the patient via phone to ensure availability of phone/video source, confirm preferred email & phone number, and discuss instructions and expectations.  I reminded Madison [name] to be prepared with any vital sign and/or heart rhythm information that could potentially be obtained via home monitoring, at the time of her visit. I reminded Madison [name] to expect a phone call prior  to her visit.  Valrie Hart, CMA 01/30/2019 12:04 PM   INSTRUCTIONS FOR DOWNLOADING THE MYCHART APP TO SMARTPHONE  - The patient must first make sure to have activated MyChart and know their login information - If Apple, go to [employer] and type in MyChart in the search bar and download the app. If Android, ask patient to go to [employer] and type in [place] in the search bar and download the app. The app is free but as with any other app downloads, their phone may require them to verify saved payment information or Apple/Android  password.  - The patient will need to then log into the app with their MyChart username and password, and select [facility] as their healthcare provider to link the account. When it is time for your visit, go to the MyChart app, find appointments, and click Begin Video Visit. Be sure to Select Allow for your device to access the Microphone and Camera for your visit. You will then be connected, and your provider will be with you shortly.  **If they have any issues connecting, or need assistance please contact MyChart service desk [phone])**  **If using a computer, in order to ensure the best quality for their visit they will need to use either of the following Internet Browsers: [employer] or Google Chrome**  IF USING DOXIMITY or DOXY.ME - The patient will receive a link just prior to their visit by text.     FULL LENGTH CONSENT FOR TELE-HEALTH VISIT   I hereby voluntarily request, consent and authorize [facility] and its employed or contracted physicians, physician assistants, nurse practitioners or other licensed health care professionals (the Practitioner), to provide me with telemedicine health care services (the "Services") as deemed necessary by the treating Practitioner. I acknowledge and consent to receive the Services by the Practitioner via telemedicine. I understand that the telemedicine visit will involve communicating with the Practitioner through live audiovisual communication technology and the disclosure of certain medical information by electronic transmission. I acknowledge that I have been given the opportunity to request an in-person assessment or other available alternative prior to the telemedicine visit and am voluntarily participating in the telemedicine visit.  I understand that I have the right to withhold or withdraw my consent to the use of telemedicine in the course of my care at any time, without affecting my right to future care or treatment,  and that the Practitioner or I may terminate the telemedicine visit at any time. I understand that I have the right to inspect all information obtained and/or recorded in the course of the telemedicine visit and may receive copies of available information for a reasonable fee.  I understand that some of the potential risks of receiving the Services via telemedicine include:  Marland Kitchen Delay or interruption in medical evaluation due to technological equipment failure or disruption; . Information transmitted may not be sufficient (e.g. poor resolution of images) to allow for appropriate medical decision making by the Practitioner; and/or  . In rare instances, security protocols could fail, causing a breach of personal health information.  Furthermore, I acknowledge that it is my responsibility to provide information about my medical history, conditions and care that is complete and accurate to the best of my ability. I acknowledge that Practitioner's advice, recommendations, and/or decision may be based on factors not within their control, such as incomplete or inaccurate data provided by me or distortions of diagnostic images or specimens that may result from electronic  transmissions. I understand that the practice of medicine is not an exact science and that Practitioner makes no warranties or guarantees regarding treatment outcomes. I acknowledge that I will receive a copy of this consent concurrently upon execution via email to the email address I last provided but may also request a printed copy by calling the office of [facility].    I understand that my insurance will be billed for this visit.   I have read or had this consent read to me. . I understand the contents of this consent, which adequately explains the benefits and risks of the Services being provided via telemedicine.  . I have been provided ample opportunity to ask questions regarding this consent and the Services and have had my questions  answered to my satisfaction. . I give my informed consent for the services to be provided through the use of telemedicine in my medical care  By participating in this telemedicine visit I agree to the above.

## 2019-01-30 NOTE — Addendum Note (Signed)
Addended by: Drue Novel I on: 01/30/2019 12:28 PM   Modules accepted: Orders

## 2019-01-30 NOTE — Progress Notes (Signed)
Virtual Visit via Video Note   This visit type was conducted due to national recommendations for restrictions regarding the COVID-19 Pandemic (e.g. social distancing) in an effort to limit this patient's exposure and mitigate transmission in our community.  Due to her co-morbid illnesses, this patient is at least at moderate risk for complications without adequate follow up.  This format is felt to be most appropriate for this patient at this time.  All issues noted in this document were discussed and addressed.  A limited physical exam was performed with this format.  Please refer to the patient's chart for her consent to telehealth for [facility]. Switched to telephone  Date:  01/30/2019   ID:  Madison [name], DOB [birth year], MRN [id number]  Patient Location: Home Provider Location: Home  PCP:  Gweneth [name], Wendy, MD  Cardiologist:  Tobias AlexanderKatarina Nelson, MD   Electrophysiologist:  None   Evaluation Performed:  Follow-Up Visit  Chief Complaint:  edema  History of Present Illness:    Madison [name] is a 78 y.o. female with history of chronic LE edema,SVT, HTN, obesity, CKD followed by nephrology. She saw Dr. Delton SeeNelson 11/05/18.She had worsening LE edema after hip replacement and nephrology increased lasix from 20 mg to 60 mg bid.BNP was normal. Wasn't exercising at all and had gained 24 lbs. Dr. Delton SeeNelson felt weight gain was due to inactivity and recommended weight loss.  Patient doing better. Swelling down. Lost 8 lbs. Walking about 500 feet. No palpitations or cardiac complaints.    The patient does not have symptoms concerning for COVID-19 infection (fever, chills, cough, or new shortness of breath).    Past Medical History:  Diagnosis Date  . Benign hypertension with chronic kidney disease, stage III    Overview:  Last Assessment & Plan:  Usually the patient is hypertensive. However today her blood pressure is 105 systolic. She has been feeling fatigued with some lightheadedness. Part of  this may be related to her lower blood pressure. Her pressure may be improved with her decrease in salt and fluid intake. I've instructed her to put her lisinopril/hctz on hold. I've asked her to see her primary   . Cardiac disease 03/10/2014  . Chronic diarrhea 07/27/2015  . Edema 07/27/2015  . Ejection fraction 2004   Normal, echo,   . Gastroesophageal reflux disease   . GERD (gastroesophageal reflux disease)   . Heart disease 03/10/2014  . Hypertension   . Obesity   . Obesity (BMI 30-39.9) 09/25/2017  . OSA (obstructive sleep apnea)    mild with AHI 6.75 - on CPAP  . Preop cardiovascular exam 11/2010   Cardiac clearance for knee surgery   . Sleep apnea 03/10/2014  . 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 It was possibly reentrant. She does well with diltiazem. No change in therapy.  . 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 Urinary incontinence    Past Surgical History:  Procedure Laterality Date  . CATARACT EXTRACTION Left 8/11  . CATARACT EXTRACTION Right 3/12  . CHOLECYSTECTOMY  1994  . Copsilotomy Laser Treatment Left 6/12   eye  . ELBOW SURGERY  8/12  . 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 EYE  SURGERY Right 12/11  . Lumbar Infusion  2011  . MOLE REMOVAL  06/17/2015  . 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 TOTAL KNEE ARTHROPLASTY Right 06/13/2011     Current Meds  Medication Sig  . acetaminophen (TYLENOL) 500 MG tablet Take 1 tablet by mouth as needed for pain.  . Calcium Carbonate (CALCIUM 600 PO) Take by mouth daily.   . Cholecalciferol (VITAMIN D3) 2000 UNITS TABS Take 4,000 Units by mouth daily.  Marland Kitchen diltiazem (CARDIZEM CD) 180 MG 24 hr capsule  TAKE 1 CAPSULE BY MOUTH EVERY DAY  . fluocinonide cream (LIDEX) 0.25 % Apply 1 application topically as needed (for rash).   . furosemide (LASIX) 20 MG tablet Take 60 mg by mouth 2 (two) times daily.   Marland Kitchen KLOR-CON M20 20 MEQ tablet TAKE 1 TABLET BY MOUTH EVERY DAY  . nystatin-triamcinolone (MYCOLOG II) cream Apply 1 application topically as needed.   Marland Kitchen omeprazole (PRILOSEC) 40 MG capsule Take 40 mg by mouth 3 (three) times a week.   . Prenatal Vit-Fe Fumarate-FA (PRENATAL MULTIVITAMIN) TABS tablet Take 1 tablet by mouth daily at 12 noon.  . WELCHOL 625 MG tablet TAKE 1 TO 2 TABLETS BY MOUTH EVERY DAY AS NEEDED FOR DIARRHEA     Allergies:   Patient has no known allergies.   Social History   Tobacco Use  . Smoking status: Former Research scientist (life sciences)  . Smokeless tobacco: Never Used  Substance Use Topics  . Alcohol use: No  . Drug use: No     Family Hx: The patient's family history includes Diabetes in her father and mother; Heart Problems in her father; Heart attack in her father, maternal grandfather, paternal grandmother, and paternal uncle; High blood pressure in her mother; Prostate cancer in her father. There is no history of Stroke.  ROS:   Please see the history of present illness.      All other systems reviewed and are negative.   Prior CV studies:   The following studies were reviewed today: Echo 2016  Study Conclusions   - Left ventricle: The cavity size was normal. Wall thickness was   normal. Systolic function was normal. The estimated ejection   fraction was in the range of 55% to 60%. Wall motion was normal;   there were no regional wall motion abnormalities. Doppler   parameters are consistent with abnormal left ventricular   relaxation (grade 1 diastolic dysfunction). - Atrial septum: There was an atrial septal aneurysm. - Pulmonary arteries: Systolic pressure was mildly increased. PA   peak pressure: 34 mm Hg (S).   Impressions:   - Normal LV function; grade 1  diastolic dysfunction; trace MR and   TR; mildly elevated pulmonary pressure.     Labs/Other Tests and Data Reviewed:    EKG:  No ECG reviewed.  Recent Labs: 03/29/2018: Hemoglobin 12.8; NT-Pro BNP 278; Platelets 383; TSH 4.520   Recent Lipid Panel No results found for: CHOL, TRIG, HDL, CHOLHDL, LDLCALC, LDLDIRECT  Wt Readings from Last 3 Encounters:  01/30/19 276 lb (125.2 kg)  11/05/18 284 lb 12.8 oz (129.2 kg)  03/29/18 275 lb (124.7 kg)     Objective:    Vital Signs:  BP 128/75   Pulse 85   Temp 98 F (36.7 C)   Ht 5' 7.5" (1.715 m)   Wt 276 lb (125.2 kg)   BMI 42.59 kg/m    VITAL SIGNS:  reviewed  ASSESSMENT & PLAN:    1. Lower extremity edema Lasix increased  by nephrologist 60 mg bid-has lost 8 lbs and the swelling is better. Sees nephrologist in 1 week . Last Crt 1.09 10/2018 2. HTN BP well controlled 3. CKD stage 3 followed by [facility]rology 4. SVT on diltiazem-refill 5. Morbid obesity exercise and weight loss recommended  COVID-19 Education: The signs and symptoms of COVID-19 were discussed with the patient and how to seek care for testing (follow up with PCP or arrange E-visit).  The importance of social distancing was discussed today.  Time:   Today, I have spent 10:30 minutes with the patient with telehealth technology discussing the above problems.     Medication Adjustments/Labs and Tests Ordered: Current medicines are reviewed at length with the patient today.  Concerns regarding medicines are outlined above.   Tests Ordered: No orders of the defined types were placed in this encounter.   Medication Changes: No orders of the defined types were placed in this encounter.   Follow Up:  Either In Person or Virtual Visit in 1 year(s) Dr. Delton See  Signed, Jacolyn Reedy, PA-C  01/30/2019 12:24 PM    [facility]

## 2019-01-30 NOTE — Patient Instructions (Signed)
Medication Instructions:  Your physician recommends that you continue on your current medications as directed. Please refer to the Current Medication list given to you today.  If you need a refill on your cardiac medications before your next appointment, please call your pharmacy.   Lab work: None Ordered  If you have labs (blood work) drawn today and your tests are completely normal, you will receive your results only by: Marland Kitchen MyChart Message (if you have MyChart) OR . A paper copy in the mail If you have any lab test that is abnormal or we need to change your treatment, we will call you to review the results.  Testing/Procedures: None ordered  Follow-Up: At [facility], you and your health needs are our priority.  As part of our continuing mission to provide you with exceptional heart care, we have created designated Provider Care Teams.  These Care Teams include your primary Cardiologist (physician) and Advanced Practice Providers (APPs -  Physician Assistants and Nurse Practitioners) who all work together to provide you with the care you need, when you need it. . You will need a follow up appointment in 1 year.  Please call our office 2 months in advance to schedule this appointment.  You may see Ena Dawley, MD or one of the following Advanced Practice Providers on your designated Care Team:   . Lyda Jester, PA-C . Dayna Dunn, PA-C . Ermalinda Barrios, PA-C  Any Other Special Instructions Will Be Listed Below (If Applicable).  Your physician encouraged you to lose weight for better health.  Your provider recommends that you maintain 150 minutes per week of moderate aerobic activity.

## 2019-01-30 NOTE — Telephone Encounter (Signed)
Patient returned my call, she is aware that Lorre Nick will be calling her closer to her appointment time at 12:30 to go over medications and obtain consent for virtual visit.

## 2019-03-14 ENCOUNTER — Telehealth: Payer: Self-pay | Admitting: Cardiology

## 2019-03-14 ENCOUNTER — Telehealth: Payer: Self-pay | Admitting: *Deleted

## 2019-03-14 NOTE — Telephone Encounter (Signed)
Patient says pressure is too strong, she has changed out all her supplies and it runs out of water during the night. She states in the front of her machine it reads 12.5 pressure.

## 2019-03-14 NOTE — Telephone Encounter (Signed)
-----   Message from Sueanne Margarita, MD sent at 03/14/2019  3:46 PM EST ----- Good AHI and compliance.  Continue current PAP settings.

## 2019-03-14 NOTE — Telephone Encounter (Signed)
She is on auto PAP and her average used pressure is 10.8/hr and if we turn it down she will have to many apneas

## 2019-03-14 NOTE — Telephone Encounter (Signed)
It sounds like something is wrong with her machine - she needs to get it checked at the DME

## 2019-03-14 NOTE — Telephone Encounter (Signed)
Can you get a download from patient and then she is due for 1 year followup with me

## 2019-03-14 NOTE — Telephone Encounter (Signed)
New message    Patient calling to report issues with CPAP States too much air is blowing.

## 2019-03-14 NOTE — Telephone Encounter (Signed)
Patient says she can't stand to wear with it blowing too much like it is.. It runs out of water and has a burnt smell. She wakes up with water running out of her nose. She states it wakes her and her husband up all during the night making a bothersome roaring noise. They are not with the answer they got from you. Patient said when the works well it is great but now she is aggravated with it.  Please advise

## 2019-03-14 NOTE — Telephone Encounter (Signed)
Informed patient of compliance results and verbalized understanding was indicated. Patient is aware and agreeable to AHI being within range at 3.3. Patient is aware and agreeable to being in compliance with machine usage. Patient is aware and agreeable to no change in current pressures.  Patient would like her pressure turned down to 6 or 7 cm H20.

## 2019-03-15 NOTE — Telephone Encounter (Signed)
Reached out to [employer] spoke to RT Amy and she will call patient and trouble shoot her cpap issues.

## 2019-07-28 ENCOUNTER — Other Ambulatory Visit: Payer: Self-pay | Admitting: Cardiology

## 2019-10-29 ENCOUNTER — Other Ambulatory Visit: Payer: Self-pay | Admitting: Physician Assistant

## 2019-11-18 ENCOUNTER — Telehealth: Payer: Self-pay | Admitting: Cardiology

## 2019-11-18 NOTE — Telephone Encounter (Signed)
Patient called and wanted to know if her CPAP machine was affected by the recent recall. She wanted to make sure it is working properly

## 2019-11-19 NOTE — Telephone Encounter (Signed)
Reached out to patient and encouraged her to call her dme to inquire about the cpap recall.[email or web address]

## 2020-01-28 ENCOUNTER — Ambulatory Visit (INDEPENDENT_AMBULATORY_CARE_PROVIDER_SITE_OTHER): Payer: Medicare Other | Admitting: Cardiology

## 2020-01-28 ENCOUNTER — Encounter: Payer: Self-pay | Admitting: *Deleted

## 2020-01-28 ENCOUNTER — Encounter: Payer: Self-pay | Admitting: Cardiology

## 2020-01-28 ENCOUNTER — Other Ambulatory Visit: Payer: Self-pay

## 2020-01-28 VITALS — BP 128/62 | HR 74 | Ht 67.5 in | Wt 286.4 lb

## 2020-01-28 DIAGNOSIS — R0600 Dyspnea, unspecified: Secondary | ICD-10-CM | POA: Diagnosis not present

## 2020-01-28 DIAGNOSIS — R0609 Other forms of dyspnea: Secondary | ICD-10-CM

## 2020-01-28 DIAGNOSIS — I10 Essential (primary) hypertension: Secondary | ICD-10-CM

## 2020-01-28 DIAGNOSIS — I471 Supraventricular tachycardia: Secondary | ICD-10-CM

## 2020-01-28 DIAGNOSIS — I447 Left bundle-branch block, unspecified: Secondary | ICD-10-CM | POA: Diagnosis not present

## 2020-01-28 DIAGNOSIS — R600 Localized edema: Secondary | ICD-10-CM

## 2020-01-28 NOTE — Progress Notes (Signed)
Cardiology Office Note    Date:  01/28/2020   ID:  Madison [name], DOB 10/10/1940, MRN [id number]  PCP:  [name], MD  Cardiologist:  Dr Myrtis Ser --> Tobias Alexander, MD   Chief complaint/reason for visit: lower extremity edema, dyspnea on exertion  History of Present Illness:  Madison [name] is a 79 y.o. female who has been previously followed by Dr. Myrtis Ser for lower extremity edema, improved with low-dose of Lasix and salt restriction, she also has history of SVT with heart rate about 200, well controlled on diltiazem. She hasn't had an episode in the last 15 years. 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 Her kidney function has improved with change in her medication and increase of hydration.   The patient is coming after 6 months, she and her husband both in retirement community, she states that she is becoming increasingly less mobile mostly because of orthopedic problems such as knee pain and quadriceps pain.  Her husband also noticed that she is getting more winded.  She is minimally active and walks about 200 feet once a day.  She also works with a physical therapy.  She denies any chest pain, she is stable lower extremity edema, no orthopnea or proximal nocturnal dyspnea.  No dizziness or palpitations.  Past Medical History:  Diagnosis Date  . Benign hypertension with chronic kidney disease, stage III (HCC)    Overview:  Last Assessment & Plan:  Usually the patient is hypertensive. However today her blood pressure is 105 systolic. She has been feeling fatigued with some lightheadedness. Part of this may be related to her lower blood pressure. Her pressure may be improved with her decrease in salt and fluid intake. I've instructed her to put her lisinopril/hctz on hold. I've asked her to see her primary   . Cardiac disease 03/10/2014  . Chronic diarrhea 07/27/2015  . Edema 07/27/2015  . Ejection fraction 2004   Normal,  echo,   . Gastroesophageal reflux disease   . GERD (gastroesophageal reflux disease)   . Heart disease 03/10/2014  . Hypertension   . Obesity   . Obesity (BMI 30-39.9) 09/25/2017  . OSA (obstructive sleep apnea)    mild with AHI 6.75 - on CPAP  . Preop cardiovascular exam 11/2010   Cardiac clearance for knee surgery   . Sleep apnea 03/10/2014  . 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 It was possibly reentrant. She does well with diltiazem. No change in therapy.  . 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 Urinary incontinence    Past Surgical History:  Procedure Laterality Date  . CATARACT EXTRACTION Left 8/11  . CATARACT EXTRACTION Right 3/12  . CHOLECYSTECTOMY  1994  . Copsilotomy Laser Treatment Left 6/12   eye  . ELBOW SURGERY  8/12  . 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 EYE SURGERY Right 12/11  . Lumbar Infusion  2011  . MOLE REMOVAL  06/17/2015  . 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  acetaminophen (TYLENOL) 500 MG tablet Take 1 tablet by mouth as needed for pain.    . Calcium Carbonate (CALCIUM 600 PO) Take by mouth daily.     . Cholecalciferol (VITAMIN D3) 2000 UNITS TABS Take 4,000 Units by mouth daily.    Marland Kitchen diltiazem (CARDIZEM CD) 180 MG 24 hr capsule TAKE 1 CAPSULE BY MOUTH EVERY DAY 90 capsule 3  . fluocinonide cream (LIDEX) 0.05 % Apply 1 application topically as needed (for rash).     . furosemide (LASIX) 20 MG tablet Take 60 mg by mouth 2 (two) times daily.     Marland Kitchen KLOR-CON M20 20 MEQ tablet TAKE 1 TABLET  BY MOUTH EVERY DAY 90 tablet 2  . nystatin-triamcinolone (MYCOLOG II) cream Apply 1 application topically as needed.     Marland Kitchen omeprazole (PRILOSEC) 40 MG capsule Take 40 mg by mouth 3 (three) times a week.   3  . Prenatal Vit-Fe Fumarate-FA (PRENATAL MULTIVITAMIN) TABS tablet Take 1 tablet by mouth daily at 12 noon.    . WELCHOL 625 MG tablet TAKE 1 TO 2 TABLETS BY MOUTH EVERY DAY AS NEEDED FOR DIARRHEA  1   No facility-administered medications prior to visit.    Allergies:   Patient has no known allergies.   Social History   Socioeconomic History  . Marital status: Married    Spouse name: Not on file  . Number of children: Not on file  . Years of education: Not on file  . Highest education level: Not on file  Occupational History  . Not on file  Tobacco Use  . Smoking status: Former Games developer  . Smokeless tobacco: Never Used  Vaping Use  . Vaping Use: Never used  Substance and Sexual Activity  . Alcohol use: No  . Drug use: No  . Sexual activity: Not on file  Other Topics Concern  . Not on file  Social History Narrative  . Not on file   Social Determinants of Health   Financial Resource Strain:   . Difficulty of Paying Living Expenses: Not on file  Food Insecurity:   . Worried About Programme researcher, broadcasting/film/video in the Last Year: Not on file  . Ran Out of Food in the Last Year: Not on file  Transportation Needs:   . Lack of Transportation (Medical): Not on file  . Lack of Transportation (Non-Medical): Not on file  Physical Activity:   . Days of Exercise per Week: Not on file  . Minutes of Exercise per Session: Not on file  Stress:   . Feeling of Stress : Not on file  Social Connections:   . Frequency of Communication with Friends and Family: Not on file  . Frequency of Social Gatherings with Friends and Family: Not on file  . Attends Religious Services: Not on file  . Active Member of Clubs or Organizations: Not on file  . Attends Banker Meetings: Not on file  .  Marital Status: Not on file    Family History:  The patient's family history includes Diabetes in her father and mother; Heart Problems in her father; Heart attack in her father, maternal grandfather, paternal grandmother, and paternal uncle; High blood pressure in her mother; Prostate cancer in her father.   ROS:   Please see the history of present illness.    ROS All other systems reviewed and are negative.  PHYSICAL EXAM:   VS:  BP 128/62   Pulse 74   Ht 5' 7.5" (1.715 m)  Wt 286 lb 6.4 oz (129.9 kg)   SpO2 95%   BMI 44.19 kg/m    GEN: Well nourished, well developed, in no acute distress  HEENT: normal  Neck: no JVD, carotid bruits, or masses Cardiac: RRR; no murmurs, rubs, or gallops,no edema  Respiratory:  clear to auscultation bilaterally, normal work of breathing GI: soft, nontender, nondistended, + BS MS: no deformity or atrophy  Skin: warm and dry, no rash Neuro:  Alert and Oriented x 3, Strength and sensation are intact Psych: euthymic mood, full affect  Wt Readings from Last 3 Encounters:  01/28/20 286 lb 6.4 oz (129.9 kg)  01/30/19 276 lb (125.2 kg)  11/05/18 284 lb 12.8 oz (129.2 kg)    Studies/Labs Reviewed:   EKG:  EKG is ordered today, it shows normal sinus rhythm, left bundle branch block that is new from the prior EKG.  This was personally reviewed TTE: 10/2014 - Left ventricle: The cavity size was normal. Wall thickness was   normal. Systolic function was normal.  - LVEF 55% to 60%. Wall motion was normal; - abnormal left ventricular relaxation (grade 1 diastolic dysfunction). - Atrial septum: There was an atrial septal aneurysm. - Pulmonary arteries: Systolic pressure was mildly increased. PA   peak pressure: 34 mm Hg (S). Impressions: - Normal LV function; grade 1 diastolic dysfunction; trace MR and   TR; mildly elevated pulmonary pressure.  Recent Labs: No results found for requested labs within last 8760 hours.   Lipid Panel No results found  for: CHOL, TRIG, HDL, CHOLHDL, VLDL, LDLCALC, LDLDIRECT   ASSESSMENT:    1. DOE (dyspnea on exertion)   2. New onset left bundle branch block (LBBB)   3. Supraventricular tachycardia (HCC)   4. Edema of lower extremity   5. Morbid obesity (HCC)   6. Essential hypertension     PLAN:  In order of problems listed above:  1. Left bundle branch block -new and worsening dyspnea on exertion, will obtain new echocardiogram, last in 2016 showed LVEF 55 to 60%.  We will also obtain a Lexiscan nuclear stress test. 2. Bilateral lower extremity edema -she continues to take Lasix, she is also advised to use ankle pumps as she is minimally active.   3. Hypertension -well controlled 4. SVT well controlled with diltiazem.  Medication Adjustments/Labs and Tests Ordered: Current medicines are reviewed at length with the patient today.  Concerns regarding medicines are outlined above.  Medication changes, Labs and Tests ordered today are listed in the Patient Instructions below. Patient Instructions  Medication Instructions:   Your physician recommends that you continue on your current medications as directed. Please refer to the Current Medication list given to you today.  *If you need a refill on your cardiac medications before your next appointment, please call your pharmacy*  Testing/Procedures:  Your physician has requested that you have an echocardiogram. Echocardiography is a painless test that uses sound waves to create images of your heart. It provides your doctor with information about the size and shape of your heart and how well your heart's chambers and valves are working. This procedure takes approximately one hour. There are no restrictions for this procedure.  PLEASE SCHEDULE ECHO AND LEXISCAN ON D-SPECT FOR TOMORROW IF POSSIBLE PER DR. Delton See  Your physician has requested that you have a lexiscan myoview. For further information please visit [email or web address] Please follow  instruction sheet, as given.  PLEASE SCHEDULE LEXISCAN TO DO ON D-SPECT AND DO TOMORROW WITH ECHO IF POSSIBLE PER  DR. Delton See  Follow-Up: At [facility], you and your health needs are our priority.  As part of our continuing mission to provide you with exceptional heart care, we have created designated Provider Care Teams.  These Care Teams include your primary Cardiologist (physician) and Advanced Practice Providers (APPs -  Physician Assistants and Nurse Practitioners) who all work together to provide you with the care you need, when you need it.  We recommend signing up for the patient portal called "MyChart".  Sign up information is provided on this After Visit Summary.  MyChart is used to connect with patients for Virtual Visits (Telemedicine).  Patients are able to view lab/test results, encounter notes, upcoming appointments, etc.  Non-urgent messages can be sent to your provider as well.   To learn more about what you can do with MyChart, go to [email or web address].    Your next appointment:   6 month(s)  The format for your next appointment:   In Person  Provider:   Tobias Alexander, MD        Signed, Tobias Alexander, MD  01/28/2020 9:54 AM    [facility] [address] [place], [place], [place]  [address] Phone: [phone]; Fax: [phone]

## 2020-01-28 NOTE — Patient Instructions (Signed)
Medication Instructions:   Your physician recommends that you continue on your current medications as directed. Please refer to the Current Medication list given to you today.  *If you need a refill on your cardiac medications before your next appointment, please call your pharmacy*  Testing/Procedures:  Your physician has requested that you have an echocardiogram. Echocardiography is a painless test that uses sound waves to create images of your heart. It provides your doctor with information about the size and shape of your heart and how well your heart's chambers and valves are working. This procedure takes approximately one hour. There are no restrictions for this procedure.  PLEASE SCHEDULE ECHO AND LEXISCAN ON D-SPECT FOR TOMORROW IF POSSIBLE PER DR. Delton See  Your physician has requested that you have a lexiscan myoview. For further information please visit [email or web address] Please follow instruction sheet, as given.  PLEASE SCHEDULE LEXISCAN TO DO ON D-SPECT AND DO TOMORROW WITH ECHO IF POSSIBLE PER DR. Delton See  Follow-Up: At [facility], you and your health needs are our priority.  As part of our continuing mission to provide you with exceptional heart care, we have created designated Provider Care Teams.  These Care Teams include your primary Cardiologist (physician) and Advanced Practice Providers (APPs -  Physician Assistants and Nurse Practitioners) who all work together to provide you with the care you need, when you need it.  We recommend signing up for the patient portal called "MyChart".  Sign up information is provided on this After Visit Summary.  MyChart is used to connect with patients for Virtual Visits (Telemedicine).  Patients are able to view lab/test results, encounter notes, upcoming appointments, etc.  Non-urgent messages can be sent to your provider as well.   To learn more about what you can do with MyChart, go to [email or web address].    Your next appointment:    6 month(s)  The format for your next appointment:   In Person  Provider:   Tobias Alexander, MD

## 2020-02-07 ENCOUNTER — Ambulatory Visit: Payer: Medicare Other | Admitting: Cardiology

## 2020-02-19 ENCOUNTER — Telehealth (HOSPITAL_COMMUNITY): Payer: Self-pay | Admitting: *Deleted

## 2020-02-19 NOTE — Telephone Encounter (Signed)
Patient given detailed instructions per Myocardial Perfusion Study Information Sheet for the test on 02/24/20 at 10:15. Patient notified to arrive 15 minutes early and that it is imperative to arrive on time for appointment to keep from having the test rescheduled.  If you need to cancel or reschedule your appointment, please call the office within 24 hours of your appointment. . Patient verbalized understanding.Madison [name]

## 2020-02-24 ENCOUNTER — Ambulatory Visit (HOSPITAL_COMMUNITY): Payer: Medicare Other | Attending: Cardiology

## 2020-02-24 ENCOUNTER — Ambulatory Visit (HOSPITAL_BASED_OUTPATIENT_CLINIC_OR_DEPARTMENT_OTHER): Payer: Medicare Other

## 2020-02-24 ENCOUNTER — Other Ambulatory Visit: Payer: Self-pay

## 2020-02-24 DIAGNOSIS — I447 Left bundle-branch block, unspecified: Secondary | ICD-10-CM

## 2020-02-24 DIAGNOSIS — R0609 Other forms of dyspnea: Secondary | ICD-10-CM

## 2020-02-24 DIAGNOSIS — R0600 Dyspnea, unspecified: Secondary | ICD-10-CM

## 2020-02-24 LAB — ECHOCARDIOGRAM COMPLETE
AR max vel: 1.83 cm2
AV Area VTI: 1.93 cm2
AV Area mean vel: 1.79 cm2
AV Mean grad: 10 mmHg
AV Peak grad: 18.7 mmHg
Ao pk vel: 2.16 m/s
Area-P 1/2: 3.65 cm2
S' Lateral: 3.1 cm

## 2020-02-24 MED ORDER — REGADENOSON 0.4 MG/5ML IV SOLN
0.4000 mg | Freq: Once | INTRAVENOUS | Status: AC
  Administered 2020-02-24: 0.4 mg via INTRAVENOUS

## 2020-02-24 MED ORDER — TECHNETIUM TC 99M TETROFOSMIN IV KIT
30.5000 | PACK | Freq: Once | INTRAVENOUS | Status: AC | PRN
  Administered 2020-02-24: 30.5 via INTRAVENOUS
  Filled 2020-02-24: qty 31

## 2020-02-25 ENCOUNTER — Ambulatory Visit (HOSPITAL_COMMUNITY): Payer: Medicare Other | Attending: Cardiovascular Disease

## 2020-02-25 LAB — MYOCARDIAL PERFUSION IMAGING
Estimated workload: 8.9 METS
Exercise duration (min): 7 min
Exercise duration (sec): 15 s
LV dias vol: 116 mL (ref 46–106)
LV sys vol: 40 mL
MPHR: 141 {beats}/min
Peak HR: 83 {beats}/min
Percent HR: 94 %
RPE: 19
Rest HR: 67 {beats}/min
SDS: 1
SRS: 0
SSS: 1
TID: 0.79

## 2020-02-25 MED ORDER — TECHNETIUM TC 99M TETROFOSMIN IV KIT
30.1000 | PACK | Freq: Once | INTRAVENOUS | Status: AC | PRN
  Administered 2020-02-25: 30.1 via INTRAVENOUS
  Filled 2020-02-25: qty 31

## 2020-03-03 ENCOUNTER — Other Ambulatory Visit: Payer: Self-pay | Admitting: Sports Medicine

## 2020-03-03 DIAGNOSIS — M48061 Spinal stenosis, lumbar region without neurogenic claudication: Secondary | ICD-10-CM

## 2020-03-13 ENCOUNTER — Other Ambulatory Visit: Payer: Self-pay

## 2020-03-13 ENCOUNTER — Ambulatory Visit: Payer: Medicare Other | Admitting: Cardiology

## 2020-03-13 ENCOUNTER — Encounter: Payer: Self-pay | Admitting: Cardiology

## 2020-03-13 ENCOUNTER — Telehealth: Payer: Self-pay | Admitting: *Deleted

## 2020-03-13 VITALS — BP 134/64 | HR 64 | Ht 67.5 in | Wt 278.2 lb

## 2020-03-13 DIAGNOSIS — G4733 Obstructive sleep apnea (adult) (pediatric): Secondary | ICD-10-CM | POA: Diagnosis not present

## 2020-03-13 DIAGNOSIS — E669 Obesity, unspecified: Secondary | ICD-10-CM

## 2020-03-13 DIAGNOSIS — I10 Essential (primary) hypertension: Secondary | ICD-10-CM | POA: Diagnosis not present

## 2020-03-13 NOTE — Progress Notes (Signed)
Cardiology Office Note:    Date:  03/13/2020   ID:  Madison [name], DOB May 11, 1940, MRN [id number]  PCP:  [name], MD  Cardiologist:  Tobias Alexander, MD    Referring MD: [name], MD   Chief Complaint  Patient presents with  . Sleep Apnea  . Hypertension    History of Present Illness:    Madison [name] is a 79 y.o. female with a hx of GERD, hypertension, SVT.  She was diagnosed with obstructive sleep apnea in 2012 and was started on CPAP at 11 cm H2O.    She is doing well with her CPAP device and thinks that she has gotten used to it.  She tolerates the nasal mask and feels the pressure is adequate.  Since going on CPAP she feels rested in the am and has no significant daytime sleepiness.  She has a lot of mouth dryness but says that her mouth does not drop open.   She does not think that he snores.  Unfortunately her Respironics device is under recall and is concerned about what to do with it.      Past Medical History:  Diagnosis Date  . Benign hypertension with chronic kidney disease, stage III (HCC)    Overview:  Last Assessment & Plan:  Usually the patient is hypertensive. However today her blood pressure is 105 systolic. She has been feeling fatigued with some lightheadedness. Part of this may be related to her lower blood pressure. Her pressure may be improved with her decrease in salt and fluid intake. I've instructed her to put her lisinopril/hctz on hold. I've asked her to see her primary   . Cardiac disease 03/10/2014  . Chronic diarrhea 07/27/2015  . Edema 07/27/2015  . Ejection fraction 2004   Normal, echo,   . Gastroesophageal reflux disease   . GERD (gastroesophageal reflux disease)   . Heart disease 03/10/2014  . Hypertension   . Obesity   . Obesity (BMI 30-39.9) 09/25/2017  . OSA (obstructive sleep apnea)    mild with AHI 6.75 - on CPAP  . Preop cardiovascular exam 11/2010   Cardiac clearance for knee surgery   . Sleep apnea 03/10/2014  .  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 It was possibly reentrant. She does well with diltiazem. No change in therapy.  . 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 Urinary incontinence     Past Surgical History:  Procedure Laterality Date  . CATARACT EXTRACTION Left 8/11  . CATARACT EXTRACTION Right 3/12  . CHOLECYSTECTOMY  1994  . Copsilotomy Laser Treatment Left 6/12   eye  . ELBOW SURGERY  8/12  . 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 EYE SURGERY Right 12/11  . Lumbar Infusion  2011  . MOLE REMOVAL  06/17/2015  . 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 TOTAL KNEE ARTHROPLASTY Right 06/13/2011    Current Medications: Current Meds  Medication Sig  . acetaminophen (TYLENOL) 500 MG tablet Take 1 tablet by mouth as needed for pain.  . bifidobacterium infantis (ALIGN) capsule Take 1 capsule by mouth continuous dialysis.  . Calcium Carbonate (CALCIUM 600 PO) Take by mouth daily.   . Cholecalciferol (VITAMIN  D3) 2000 UNITS TABS Take 4,000 Units by mouth daily.  Marland Kitchen diltiazem (CARDIZEM CD) 180 MG 24 hr capsule TAKE 1 CAPSULE BY MOUTH EVERY DAY  . fluocinonide cream (LIDEX) 0.05 % Apply 1 application topically as needed (for rash).   . furosemide (LASIX) 20 MG tablet Take 60 mg by mouth 2 (two) times daily.   Marland Kitchen gabapentin (NEURONTIN) 300 MG capsule Take 1 capsule by mouth as needed for pain.  . hydroxychloroquine (PLAQUENIL) 200 MG tablet Take 1 tablet by mouth in the morning and at bedtime.  Marland Kitchen KLOR-CON M20 20 MEQ tablet TAKE 1 TABLET BY MOUTH EVERY DAY  . nystatin-triamcinolone (MYCOLOG II) cream Apply 1 application topically as needed.   Marland Kitchen omeprazole (PRILOSEC) 40  MG capsule Take 40 mg by mouth 3 (three) times a week.   . Prenatal Vit-Fe Fumarate-FA (PRENATAL MULTIVITAMIN) TABS tablet Take 1 tablet by mouth daily at 12 noon.  . WELCHOL 625 MG tablet TAKE 1 TO 2 TABLETS BY MOUTH EVERY DAY AS NEEDED FOR DIARRHEA     Allergies:   Patient has no known allergies.   Social History   Socioeconomic History  . Marital status: Married    Spouse name: Not on file  . Number of children: Not on file  . Years of education: Not on file  . Highest education level: Not on file  Occupational History  . Not on file  Tobacco Use  . Smoking status: Former Games developer  . Smokeless tobacco: Never Used  Vaping Use  . Vaping Use: Never used  Substance and Sexual Activity  . Alcohol use: No  . Drug use: No  . Sexual activity: Not on file  Other Topics Concern  . Not on file  Social History Narrative  . Not on file   Social Determinants of Health   Financial Resource Strain:   . Difficulty of Paying Living Expenses: Not on file  Food Insecurity:   . Worried About Programme researcher, broadcasting/film/video in the Last Year: Not on file  . Ran Out of Food in the Last Year: Not on file  Transportation Needs:   . Lack of Transportation (Medical): Not on file  . Lack of Transportation (Non-Medical): Not on file  Physical Activity:   . Days of Exercise per Week: Not on file  . Minutes of Exercise per Session: Not on file  Stress:   . Feeling of Stress : Not on file  Social Connections:   . Frequency of Communication with Friends and Family: Not on file  . Frequency of Social Gatherings with Friends and Family: Not on file  . Attends Religious Services: Not on file  . Active Member of Clubs or Organizations: Not on file  . Attends Banker Meetings: Not on file  . Marital Status: Not on file     Family History: The patient's family history includes Diabetes in her father and mother; Heart Problems in her father; Heart attack in her father, maternal grandfather, paternal  grandmother, and paternal uncle; High blood pressure in her mother; Prostate cancer in her father. There is no history of Stroke.  ROS:   Please see the history of present illness.    ROS  All other systems reviewed and negative.   EKGs/Labs/Other Studies Reviewed:    The following studies were reviewed today: PAP download  EKG:  EKG is not ordered today.    Recent Labs: No results found for requested labs within last 8760 hours.   Recent Lipid  Panel No results found for: CHOL, TRIG, HDL, CHOLHDL, VLDL, LDLCALC, LDLDIRECT  Physical Exam:    VS:  BP 134/64   Pulse 64   Ht 5' 7.5" (1.715 m)   Wt 278 lb 3.2 oz (126.2 kg)   SpO2 94%   BMI 42.93 kg/m     Wt Readings from Last 3 Encounters:  03/13/20 278 lb 3.2 oz (126.2 kg)  02/24/20 286 lb (129.7 kg)  01/28/20 286 lb 6.4 oz (129.9 kg)     GEN: Well nourished, well developed in no acute distress HEENT: Normal NECK: No JVD; No carotid bruits LYMPHATICS: No lymphadenopathy CARDIAC:RRR, no murmurs, rubs, gallops RESPIRATORY:  Clear to auscultation without rales, wheezing or rhonchi  ABDOMEN: Soft, non-tender, non-distended MUSCULOSKELETAL:  No edema; No deformity  SKIN: Warm and dry NEUROLOGIC:  Alert and oriented x 3 PSYCHIATRIC:  Normal affect    ASSESSMENT:    1. OSA (obstructive sleep apnea)   2. Benign essential HTN   3. Obesity (BMI 30-39.9)    PLAN:    In order of problems listed above:  1.  OSA -  The patient is tolerating PAP therapy well without any problems. The patient has been using and benefiting from PAP use and will continue to benefit from therapy.  -I will get a copy of her download from the DME -I will call her DME to find out what to do about her Vear Clock device that is on recall  2.  HTN  -Bp controlled today - continue Cardizem CD 180 mg daily.  3.  Obesity  - I have encouraged her to get into a routine exercise program and cut back on carbs and portions.    Medication  Adjustments/Labs and Tests Ordered: Current medicines are reviewed at length with the patient today.  Concerns regarding medicines are outlined above.  No orders of the defined types were placed in this encounter.  No orders of the defined types were placed in this encounter.   Signed, Armanda Magic, MD  03/13/2020 11:25 AM    [facility]

## 2020-03-13 NOTE — Telephone Encounter (Signed)
Reached out to patient to ask to bring her chip in for a download and also left a message for her with the [facility] number [phone] for the recall.

## 2020-03-13 NOTE — Telephone Encounter (Signed)
-----   Message from [name], MD sent at 03/13/2020 11:30 AM EST ----- Please get a download - patient is not on airview.  Also please call her DME to fine out what to do about her phillips device recall.  She is very concerned about  continueing to use it

## 2020-03-13 NOTE — Patient Instructions (Signed)
Medication Instructions:  Your physician recommends that you continue on your current medications as directed. Please refer to the Current Medication list given to you today.  *If you need a refill on your cardiac medications before your next appointment, please call your pharmacy*  Follow-Up: At [facility], you and your health needs are our priority.  As part of our continuing mission to provide you with exceptional heart care, we have created designated Provider Care Teams.  These Care Teams include your primary Cardiologist (physician) and Advanced Practice Providers (APPs -  Physician Assistants and Nurse Practitioners) who all work together to provide you with the care you need, when you need it.  We recommend signing up for the patient portal called "MyChart".  Sign up information is provided on this After Visit Summary.  MyChart is used to connect with patients for Virtual Visits (Telemedicine).  Patients are able to view lab/test results, encounter notes, upcoming appointments, etc.  Non-urgent messages can be sent to your provider as well.   To learn more about what you can do with MyChart, go to [email or web address].    Your next appointment:   1 year(s)  The format for your next appointment:   In Person or Virtual  Provider:   Armanda Magic, MD

## 2020-03-22 ENCOUNTER — Ambulatory Visit
Admission: RE | Admit: 2020-03-22 | Discharge: 2020-03-22 | Disposition: A | Discharge status: Home / Self Care | Payer: Medicare Other | Source: Ambulatory Visit | Attending: Sports Medicine | Admitting: Sports Medicine

## 2020-03-22 ENCOUNTER — Other Ambulatory Visit: Payer: Self-pay

## 2020-03-22 DIAGNOSIS — M48061 Spinal stenosis, lumbar region without neurogenic claudication: Secondary | ICD-10-CM

## 2020-03-24 NOTE — Telephone Encounter (Signed)
New download 02/23/20- 03/23/20 [name], Madison L Summary Report Patient ID: [id number] Date of Birth: 12/01/1940 Setup Date: 10/03/2017 Date Range: Device: Mask: 02/23/2020 - 03/23/2020 DreamStation Auto CPAP V1.1.8.3313 ([id number]) [id number] Patient Reference: Location: [address] [place], [place] [phone] [address] [place], [place] [address] Phone Number: Address: Phone Number: Linked App: Care Team Armanda Magic [facility] [address] [place], [place] [address] [phone] Estill Batten ([address]) [address] [place] [address], [place], [place] [address] [phone] [facility], [facility] [address], [place], [place] [address] [phone] [facility] [address], [place], [place] [address] [phone] Compliance Information 02/23/2020 - 03/23/2020 Compliance Summary 02/23/2020 - 03/23/2020 (30 days) Days with Device Usage 30 days Days without Device Usage 0 days Percent Days with Device Usage 100.0% Cumulative Usage 8 days 23 hrs. 53 mins. 13 secs. Maximum Usage (1 Day) 9 hrs. 47 mins. 27 secs. Average Usage (All Days) 7 hrs. 11 mins. 46 secs. Average Usage (Days Used) 7 hrs. 11 mins. 46 secs. Minimum Usage (1 Day) 3 hrs. 5 mins. 36 secs. Percent of Days with Usage >= 4 Hours 93.3% Percent of Days with Usage < 4 Hours 6.7% Date Range Total Blower Time 8 days 23 hrs. 53 mins. 41 secs. CPAP Summary Average Time in Large Leak Per Day 10 mins. 16 secs. Average AHI 2.4 CPAP 8.5 cmH2O Printed By: Care Orchestrator Page 1 of 2 This report is only one of several elements to consider when evaluating therapy effectiveness and is not a substitute for diagnostic data. [email or web address] 03/24/2020 Patient ID: [id number] [name], Madison L Device Settings as of 03/23/2020 CPAP - None Device Settings Device Mode Parameter Value Auto-Trial Off CPAP Pressure 8.5 cmH2O Auto Off Off Auto On On View Optional Screens  Off Ramp Type Linear Ramp Time 20 minutes Ramp Start Pressure 4.0 cmH2O Mask Resistance Off Mask Resistance Lock Off Tubing Type 15 HT Tubing Type Lock Off EZ-Start Disabled Tube Temperature 5 Humidifier 1 Humidification Mode on Heated Tube Disconnect Adaptive Hours of Usage Device Humidification Settings Heated Tube Temperature Heated Tube Humidity Level Printed By: Care Orchestrator Page 2 of 2 This report is only one of several elements to consider when evaluating therapy effectiveness and is not a substitute for diagnostic data

## 2020-03-24 NOTE — Telephone Encounter (Signed)
Good AHI and compliance 

## 2020-03-24 NOTE — Telephone Encounter (Signed)
-----   Message from [name], MD sent at 03/13/2020 11:30 AM  Please get a download - patient is not on airview.  [name], Madison L Summary Report Patient ID: [id number] Date of Birth: 1940/12/04 Setup Date: 10/03/2017 Date Range: Device: Mask: 02/20/2019 - 03/21/2019 DreamStation Auto CPAP V1.1.7.3260 ([id number]) [id number] Patient Reference: Location: [address] [place], [place] [phone] [address] [place], [place] [address] Phone Number: Address: Phone Number: Linked App: Care Team [place], [place] ([address]) [address], [facility] [address] [place], [place] [address] [phone] [facility] [address], [place], [place] [address] [phone] Compliance Information 02/20/2019 - 03/21/2019 Compliance Summary 02/20/2019 - 03/21/2019 (30 days) Days with Device Usage 30 days Days without Device Usage 0 days Percent Days with Device Usage 100.0% Cumulative Usage 9 days 20 hrs. 9 mins. 42 secs. Maximum Usage (1 Day) 10 hrs. 44 mins. 31 secs. Average Usage (All Days) 7 hrs. 52 mins. 19 secs. Average Usage (Days Used) 7 hrs. 52 mins. 19 secs. Minimum Usage (1 Day) 2 hrs. 43 mins. 57 secs. Percent of Days with Usage >= 4 Hours 96.7% Percent of Days with Usage < 4 Hours 3.3% Date Range Total Blower Time 10 days 19 hrs. 46 mins. 36 secs. Average AHI 3.8 Auto-CPAP Summary Auto-CPAP Mean Pressure 11.3 cmH2O Auto-CPAP Peak Average Pressure 18.8 cmH2O Device Pressure <= 90% of Time 17.7 cmH2O Average Time in Large Leak Per Day 0 secs. Printed By: Care Orchestrator Page 1 of 2 This report is only one of several elements to consider when evaluating therapy effectiveness and is not a substitute for diagnostic data. [email or web address] 03/24/2020 Patient ID: [id number] [name], Madison L Device Settings as of 03/21/2019 AutoCPAP - None Device Settings Device Mode Parameter Value Min Pressure 5 cmH2O Max Pressure 20 cmH2O Auto Off On Auto On On View Optional Screens Off Ramp Type  Linear Ramp Time 5 minutes Ramp Start Pressure 4.0 cmH2O Mask Resistance Off Mask Resistance Lock Off Tubing Type 15 HT Tubing Type Lock Off Opti-Start On EZ-Start Disabled Tube Temperature 5 Humidifier Off Humidification Mode on Heated Tube Disconnect Adaptive Hours of Usage Device Humidification Settings Heated Tube Temperature Heated Tube Humidity Level Setting Varied Printed By: Care Orchestrator Page 2 of 2 This report is only one of several elements to consider when evaluating therapy effectiveness and is not a substitute for diagnostic data

## 2020-03-28 NOTE — Telephone Encounter (Signed)
Patient is aware and agreeable to AHI being within range at 2.4. Patient is aware and agreeable to being in compliance with machine usage Patient is aware and agreeable to no change in current pressures. 

## 2020-04-01 ENCOUNTER — Other Ambulatory Visit: Payer: Self-pay | Admitting: Student

## 2020-04-01 ENCOUNTER — Telehealth: Payer: Self-pay

## 2020-04-01 DIAGNOSIS — M48062 Spinal stenosis, lumbar region with neurogenic claudication: Secondary | ICD-10-CM

## 2020-04-01 NOTE — Telephone Encounter (Signed)
Phone call to patient to verify medication list and allergies for myelogram procedure. Medications pt is currently taking are safe to continue to take. Advised pt if any new medications are started prior to procedure to call and make [place] aware. Pt also instructed to have a driver the day of the procedure, she will be with [place] around 2 hours the day of,  and discharge instructions. Pt verbalized understanding.

## 2020-04-06 ENCOUNTER — Other Ambulatory Visit: Payer: Self-pay

## 2020-04-06 ENCOUNTER — Ambulatory Visit
Admission: RE | Admit: 2020-04-06 | Discharge: 2020-04-06 | Disposition: A | Discharge status: Home / Self Care | Payer: Medicare Other | Source: Ambulatory Visit | Attending: Student | Admitting: Student

## 2020-04-06 DIAGNOSIS — M48062 Spinal stenosis, lumbar region with neurogenic claudication: Secondary | ICD-10-CM

## 2020-04-06 MED ORDER — IOPAMIDOL (ISOVUE-M 200) INJECTION 41%
15.0000 mL | Freq: Once | INTRAMUSCULAR | Status: AC
  Administered 2020-04-06: 11:00:00 15 mL via INTRATHECAL

## 2020-04-06 MED ORDER — DIAZEPAM 5 MG PO TABS
5.0000 mg | ORAL_TABLET | Freq: Once | ORAL | Status: AC
  Administered 2020-04-06: 09:00:00 5 mg via ORAL

## 2020-04-06 NOTE — Discharge Instructions (Signed)

## 2020-04-08 ENCOUNTER — Other Ambulatory Visit: Payer: Self-pay | Admitting: Physician Assistant

## 2020-08-09 NOTE — Progress Notes (Deleted)
Cardiology Office Note:    Date:  08/09/2020   ID:  Madison [name], DOB July 30, 1940, MRN [id number]  PCP:  Madison Dimitri, MD   [facility]  Cardiologist:  Tobias Alexander, MD *** Advanced Practice Provider:  No care team member to display Electrophysiologist:  None  {Press F2 to show EP APP, CHF, sleep or structural heart MD               :[id number]}  { Click here to update then REFRESH NOTE - MD (PCP) or APP (Team Member)  Change PCP Type for MD, Specialty for APP is either Cardiology or Clinical Cardiac Electrophysiology  :[id number]}   Referring MD: Madison Dimitri, MD   No chief complaint on file. ***  History of Present Illness:    Madison [name] is a 80 y.o. female with a hx of ***  Past Medical History:  Diagnosis Date  . Benign hypertension with chronic kidney disease, stage III (HCC)    Overview:  Last Assessment & Plan:  Usually the patient is hypertensive. However today her blood pressure is 105 systolic. She has been feeling fatigued with some lightheadedness. Part of this may be related to her lower blood pressure. Her pressure may be improved with her decrease in salt and fluid intake. I've instructed her to put her lisinopril/hctz on hold. I've asked her to see her primary   . Cardiac disease 03/10/2014  . Chronic diarrhea 07/27/2015  . Edema 07/27/2015  . Ejection fraction 2004   Normal, echo,   . Gastroesophageal reflux disease   . GERD (gastroesophageal reflux disease)   . Heart disease 03/10/2014  . Hypertension   . Obesity   . Obesity (BMI 30-39.9) 09/25/2017  . OSA (obstructive sleep apnea)    mild with AHI 6.75 - on CPAP  . Preop cardiovascular exam 11/2010   Cardiac clearance for knee surgery   . Sleep apnea 03/10/2014  . Supraventricular tachycardia (HCC)    Documented episode in the past, possibly reentrant tachycardia  Overview:  Overview:  Documented episode in the past, possibly reentrant tachycardia  Last Assessment & Plan:  The  patient had a documented episode in the past of a supraventricular tachycardia. It was possibly reentrant. She does well with diltiazem. No change in therapy.  . 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 Urinary incontinence     Past Surgical History:  Procedure Laterality Date  . CATARACT EXTRACTION Left 8/11  . CATARACT EXTRACTION Right 3/12  . CHOLECYSTECTOMY  1994  . Copsilotomy Laser Treatment Left 6/12   eye  . ELBOW SURGERY  8/12  . 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 EYE SURGERY Right 12/11  . Lumbar Infusion  2011  . MOLE REMOVAL  06/17/2015  . 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 TOTAL KNEE ARTHROPLASTY Right 06/13/2011    Current Medications: No outpatient medications have been marked as taking for the 08/14/20 encounter (Appointment) with Meriam Sprague, MD.     Allergies:   Patient has no known allergies.   Social History   Socioeconomic History  . Marital status: Married    Spouse name: Not on file  . Number of children: Not on file  . Years of education: Not on file  . Highest education level: Not on file  Occupational History  . Not on file  Tobacco Use  . Smoking status: Former Games developer  .  Smokeless tobacco: Never Used  Vaping Use  . Vaping Use: Never used  Substance and Sexual Activity  . Alcohol use: No  . Drug use: No  . Sexual activity: Not on file  Other Topics Concern  . Not on file  Social History Narrative  . Not on file   Social Determinants of Health   Financial Resource Strain: Not on file  Food Insecurity: Not on file  Transportation Needs: Not on file  Physical Activity: Not on file  Stress: Not on file  Social Connections: Not on file     Family History: The patient's ***family history includes Diabetes in her father and mother; Heart Problems in her father; Heart attack in her father, maternal grandfather, paternal grandmother, and paternal  uncle; High blood pressure in her mother; Prostate cancer in her father. There is no history of Stroke.  ROS:   Please see the history of present illness.    *** All other systems reviewed and are negative.  EKGs/Labs/Other Studies Reviewed:    The following studies were reviewed today: ***  EKG:  EKG is *** ordered today.  The ekg ordered today demonstrates ***  Recent Labs: No results found for requested labs within last 8760 hours.  Recent Lipid Panel No results found for: CHOL, TRIG, HDL, CHOLHDL, VLDL, LDLCALC, LDLDIRECT   Risk Assessment/Calculations:   {Does this patient have ATRIAL FIBRILLATION?:[phone]}   Physical Exam:    VS:  There were no vitals taken for this visit.    Wt Readings from Last 3 Encounters:  03/13/20 278 lb 3.2 oz (126.2 kg)  02/24/20 286 lb (129.7 kg)  01/28/20 286 lb 6.4 oz (129.9 kg)     GEN: *** Well nourished, well developed in no acute distress HEENT: Normal NECK: No JVD; No carotid bruits LYMPHATICS: No lymphadenopathy CARDIAC: ***RRR, no murmurs, rubs, gallops RESPIRATORY:  Clear to auscultation without rales, wheezing or rhonchi  ABDOMEN: Soft, non-tender, non-distended MUSCULOSKELETAL:  No edema; No deformity  SKIN: Warm and dry NEUROLOGIC:  Alert and oriented x 3 PSYCHIATRIC:  Normal affect   ASSESSMENT:    No diagnosis found. PLAN:    In order of problems listed above:  1. ***   {Are you ordering a CV Procedure (e.g. stress test, cath, DCCV, TEE, etc)?   Press F2        :[id number]}    Medication Adjustments/Labs and Tests Ordered: Current medicines are reviewed at length with the patient today.  Concerns regarding medicines are outlined above.  No orders of the defined types were placed in this encounter.  No orders of the defined types were placed in this encounter.   There are no Patient Instructions on file for this visit.   Signed, Meriam Sprague, MD  08/09/2020 6:01 PM    [facility]

## 2020-08-14 ENCOUNTER — Ambulatory Visit: Payer: Medicare Other | Admitting: Cardiology

## 2020-08-30 NOTE — Progress Notes (Signed)
Cardiology Office Note:    Date:  09/01/2020   ID:  Madison [name], DOB 05-05-1940, MRN [id number]  PCP:  Madison Dimitri, MD   [facility] Providers Cardiologist:  Madison Alexander, MD (Inactive) Sleep Medicine:  Madison Magic, MD {   Referring MD: Madison Dimitri, MD    History of Present Illness:    Madison [name] is a 80 y.o. 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 Delton [name] who now returns to clinic for follow-up.  Last saw Dr. Delton [name] on 01/28/20 where she was experiencing worsening SOB. Myoview 02/25/20 showed EF 66%, no evidence of ischemia or infarction. TTE 02/23/21 with LVEF 60-65%, normal startin, mild-to-mod LAE, mild MR. She now returns to clinic for follow-up.  Today, the patient states that she overall feels well. She has some dyspnea on exertion but overall this is improved. Has chronic RLE after her knee surgery currently on lasix 60mg  BID. No chest pain, palpitations, orthopnea, or PND. Renal function stable and potassium levels normal on last labs in 07/2020.  Past Medical History:  Diagnosis Date  . Benign hypertension with chronic kidney disease, stage III (HCC)    Overview:  Last Assessment & Plan:  Usually the patient is hypertensive. However today her blood pressure is 105 systolic. She has been feeling fatigued with some lightheadedness. Part of this may be related to her lower blood pressure. Her pressure may be improved with her decrease in salt and fluid intake. I've instructed her to put her lisinopril/hctz on hold. I've asked her to [name] her primary   . Cardiac disease 03/10/2014  . Chronic diarrhea 07/27/2015  . Edema 07/27/2015  . Ejection fraction 2004   Normal, echo,   . Gastroesophageal reflux disease   . GERD (gastroesophageal reflux disease)   . Heart disease 03/10/2014  . Hypertension   . Obesity   . Obesity (BMI 30-39.9) 09/25/2017  . OSA  (obstructive sleep apnea)    mild with AHI 6.75 - on CPAP  . Preop cardiovascular exam 11/2010   Cardiac clearance for knee surgery   . Sleep apnea 03/10/2014  . 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 It was possibly reentrant. She does well with diltiazem. No change in therapy.  . 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 Urinary incontinence     Past Surgical History:  Procedure Laterality Date  . CATARACT EXTRACTION Left 8/11  . CATARACT EXTRACTION Right 3/12  . CHOLECYSTECTOMY  1994  . Copsilotomy Laser Treatment Left 6/12   eye  . ELBOW SURGERY  8/12  . 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 EYE SURGERY Right 12/11  . Lumbar Infusion  2011  . MOLE REMOVAL  06/17/2015  . 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 TOTAL KNEE ARTHROPLASTY Right 06/13/2011    Current Medications: Current Meds  Medication Sig  . acetaminophen (TYLENOL) 500 MG tablet Take 1 tablet by mouth as needed for pain.  . bifidobacterium infantis (ALIGN) capsule Take 1 capsule by mouth continuous dialysis.  . Calcium Carbonate (CALCIUM 600 PO) Take by mouth daily.  06/15/2011  Cholecalciferol (VITAMIN D3) 2000 UNITS TABS Take 4,000 Units by mouth daily.  Marland Kitchen diltiazem (CARDIZEM CD) 180 MG 24 hr capsule Take 1 capsule (180 mg total) by mouth daily.  . fluocinonide cream (LIDEX) 0.05 % Apply 1 application topically as needed (for rash).   . furosemide (LASIX) 20 MG tablet Take 60 mg by mouth 2 (two) times daily.   Marland Kitchen gabapentin (NEURONTIN) 300 MG capsule Take 1 capsule by mouth as needed for pain.  . hydroxychloroquine (PLAQUENIL) 200 MG tablet Take 1 tablet by mouth in the morning and at bedtime.  Marland Kitchen  KLOR-CON M20 20 MEQ tablet TAKE 1 TABLET BY MOUTH EVERY DAY  . nystatin-triamcinolone (MYCOLOG II) cream Apply 1 application topically as needed.   Marland Kitchen omeprazole (PRILOSEC) 40 MG capsule Take 40 mg by mouth 3 (three) times a week.   . Prenatal Vit-Fe Fumarate-FA (PRENATAL MULTIVITAMIN) TABS tablet Take 1 tablet by mouth daily at 12 noon.  Lilian Kapur 625 MG tablet Take 625 mg by mouth 2 (two) times daily with a meal.     Allergies:   Patient has no known allergies.   Social History   Socioeconomic History  . Marital status: Married    Spouse name: Not on file  . Number of children: Not on file  . Years of education: Not on file  . Highest education level: Not on file  Occupational History  . Not on file  Tobacco Use  . Smoking status: Former Games developer  . Smokeless tobacco: Never Used  Vaping Use  . Vaping Use: Never used  Substance and Sexual Activity  . Alcohol use: No  . Drug use: No  . Sexual activity: Not on file  Other Topics Concern  . Not on file  Social History Narrative  . Not on file   Social Determinants of Health   Financial Resource Strain: Not on file  Food Insecurity: Not on file  Transportation Needs: Not on file  Physical Activity: Not on file  Stress: Not on file  Social Connections: Not on file     Family History: The patient's family history includes Diabetes in her father and mother; Heart Problems in her father; Heart attack in her father, maternal grandfather, paternal grandmother, and paternal uncle; High blood pressure in her mother; Prostate cancer in her father. There is no history of Stroke.  ROS:   Please [name] the history of present illness.    Review of Systems  Constitutional: Negative for chills and fever.  HENT: Negative for hearing loss.   Eyes: Negative for blurred vision.  Respiratory: Positive for shortness of breath.   Cardiovascular: Positive for leg swelling. Negative for chest pain, palpitations, orthopnea, claudication and PND.   Gastrointestinal: Negative for nausea and vomiting.  Genitourinary: Negative for flank pain.  Musculoskeletal: Positive for joint pain and myalgias.  Neurological: Negative for dizziness and loss of consciousness.  Endo/Heme/Allergies: Negative for polydipsia.  Psychiatric/Behavioral: Negative for substance abuse.    EKGs/Labs/Other Studies Reviewed:    The following studies were reviewed today: Myoview 02/25/20:  Nuclear stress EF: 66%. The left ventricular ejection fraction is hyperdynamic (>65%).  There was no ST segment deviation noted during stress.  This is a low risk study. There is no evidence of ischemia and no evidence of previous infarction.  The study is normal.  TTE 02/24/20: IMPRESSIONS  1. Left ventricular ejection fraction, by estimation, is 60 to 65%. The  left ventricle has normal function. The left ventricle has no regional  wall  motion abnormalities. Left ventricular diastolic parameters are  consistent with Grade II diastolic  dysfunction (pseudonormalization). The average left ventricular global  longitudinal strain is -22.3 %. The global longitudinal strain is normal.  2. Right ventricular systolic function is normal. The right ventricular  size is normal. There is normal pulmonary artery systolic pressure.  3. Left atrial size was mild to moderately dilated.  4. The mitral valve is normal in structure. Mild mitral valve  regurgitation. No evidence of mitral stenosis.  5. The aortic valve is tricuspid. There is mild calcification of the  aortic valve. There is mild thickening of the aortic valve. Aortic valve  regurgitation is not visualized. Mild aortic valve sclerosis is present,  with no evidence of aortic valve  stenosis.  6. The inferior vena cava is normal in size with greater than 50%  respiratory variability, suggesting right atrial pressure of 3 mmHg.  TTE: 10/2014 - Left ventricle: The cavity size was normal. Wall thickness  was normal. Systolic function was normal.  - LVEF 55% to 60%. Wall motion was normal; - abnormal left ventricular relaxation (grade 1 diastolic dysfunction). - Atrial septum: There was an atrial septal aneurysm. - Pulmonary arteries: Systolic pressure was mildly increased. PA peak pressure: 34 mm Hg (S). Impressions: - Normal LV function; grade 1 diastolic dysfunction; trace MR and TR; mildly elevated pulmonary pressure.  EKG:  EKG is  ordered today.  The ekg ordered today demonstrates NSR with LBBB, HR 70  Recent Labs: No results found for requested labs within last 8760 hours.  Recent Lipid Panel No results found for: CHOL, TRIG, HDL, CHOLHDL, VLDL, LDLCALC, LDLDIRECT    Physical Exam:    VS:  BP 124/72   Pulse 70   Ht 5' 7.5" (1.715 m)   Wt 270 lb 12.8 oz (122.8 kg)   SpO2 97%   BMI 41.79 kg/m     Wt Readings from Last 3 Encounters:  09/01/20 270 lb 12.8 oz (122.8 kg)  03/13/20 278 lb 3.2 oz (126.2 kg)  02/24/20 286 lb (129.7 kg)     GEN:  Well nourished, well developed in no acute distress HEENT: Normal NECK: No JVD; No carotid bruits CARDIAC: RRR, no murmurs, rubs, gallops RESPIRATORY:  Clear to auscultation without rales, wheezing or rhonchi  ABDOMEN: Obese, soft, non-tender, non-distended MUSCULOSKELETAL:  Trace LE edema; No deformity  SKIN: Warm and dry NEUROLOGIC:  Alert and oriented x 3 PSYCHIATRIC:  Normal affect   ASSESSMENT:    1. DOE (dyspnea on exertion)   2. Benign essential HTN   3. Edema of lower extremity   4. Left bundle branch block   5. Essential hypertension   6. Supraventricular tachycardia (HCC)   7. Stage 3b chronic kidney disease (HCC)    PLAN:    In order of problems listed above:  #Dyspnea on Exertion: Work-up including TTE and myoview normal. LE stable. Overall symptoms are improving. -Myoview negative for ischemia -TTE with preserverd LVEF with no valvular disease -Continue to monitor  #LBBB: Chronic. TTE with  preserved LVEF.  -LVEF presevered on TTE 02/2020 -Continue to monitor with serial TTEs  #Bilateral LE edema: Stable and controlled with lasix per Nephrology. -Continue lasix 60mg  BID -Continue ankle pumps as needed  #HTN: Controlled. -Continue lasix 60mg  daily -Continue diltiazem 180mg  daily  #SVT: Well controlled. No recurrence of palpitations -Continue dilt 180mg  daily  #CKDIIIb: Follows with nephrology. -Continue lasix 60mg  BID     Medication Adjustments/Labs and Tests Ordered: Current medicines are  reviewed at length with the patient today.  Concerns regarding medicines are outlined above.  Orders Placed This Encounter  Procedures  . EKG 12-Lead   No orders of the defined types were placed in this encounter.   Patient Instructions  Medication Instructions:   Your physician recommends that you continue on your current medications as directed. Please refer to the Current Medication list given to you today.  *If you need a refill on your cardiac medications before your next appointment, please call your pharmacy*   Follow-Up:  WITH AN EXTENDER IN THE OFFICE IN October 2022 PER DR. Shari Prows       Signed, Meriam Sprague, MD  09/01/2020 1:28 PM    [facility]

## 2020-09-01 ENCOUNTER — Encounter: Payer: Self-pay | Admitting: Cardiology

## 2020-09-01 ENCOUNTER — Other Ambulatory Visit: Payer: Self-pay

## 2020-09-01 ENCOUNTER — Ambulatory Visit: Payer: Medicare Other | Admitting: Cardiology

## 2020-09-01 VITALS — BP 124/72 | HR 70 | Ht 67.5 in | Wt 270.8 lb

## 2020-09-01 DIAGNOSIS — I447 Left bundle-branch block, unspecified: Secondary | ICD-10-CM

## 2020-09-01 DIAGNOSIS — I471 Supraventricular tachycardia: Secondary | ICD-10-CM

## 2020-09-01 DIAGNOSIS — I10 Essential (primary) hypertension: Secondary | ICD-10-CM | POA: Diagnosis not present

## 2020-09-01 DIAGNOSIS — N1832 Chronic kidney disease, stage 3b: Secondary | ICD-10-CM

## 2020-09-01 DIAGNOSIS — R600 Localized edema: Secondary | ICD-10-CM

## 2020-09-01 DIAGNOSIS — R0600 Dyspnea, unspecified: Secondary | ICD-10-CM | POA: Diagnosis not present

## 2020-09-01 DIAGNOSIS — R0609 Other forms of dyspnea: Secondary | ICD-10-CM

## 2020-09-01 HISTORY — DX: Left bundle-branch block, unspecified: I44.7

## 2020-09-01 NOTE — Patient Instructions (Signed)
Medication Instructions:   Your physician recommends that you continue on your current medications as directed. Please refer to the Current Medication list given to you today.  *If you need a refill on your cardiac medications before your next appointment, please call your pharmacy*   Follow-Up:  WITH AN EXTENDER IN THE OFFICE IN October 2022 PER DR. Shari Prows

## 2020-10-24 ENCOUNTER — Other Ambulatory Visit: Payer: Self-pay

## 2020-10-24 ENCOUNTER — Emergency Department (HOSPITAL_COMMUNITY): Payer: Medicare Other

## 2020-10-24 ENCOUNTER — Encounter (HOSPITAL_COMMUNITY): Payer: Self-pay

## 2020-10-24 ENCOUNTER — Emergency Department (HOSPITAL_COMMUNITY)
Admission: EM | Admit: 2020-10-24 | Discharge: 2020-10-24 | Disposition: A | Discharge status: Home / Self Care | Payer: Medicare Other | Attending: Emergency Medicine | Admitting: Emergency Medicine

## 2020-10-24 DIAGNOSIS — Z87891 Personal history of nicotine dependence: Secondary | ICD-10-CM | POA: Insufficient documentation

## 2020-10-24 DIAGNOSIS — I129 Hypertensive chronic kidney disease with stage 1 through stage 4 chronic kidney disease, or unspecified chronic kidney disease: Secondary | ICD-10-CM | POA: Diagnosis not present

## 2020-10-24 DIAGNOSIS — R531 Weakness: Secondary | ICD-10-CM | POA: Insufficient documentation

## 2020-10-24 DIAGNOSIS — E039 Hypothyroidism, unspecified: Secondary | ICD-10-CM | POA: Diagnosis not present

## 2020-10-24 DIAGNOSIS — Z96653 Presence of artificial knee joint, bilateral: Secondary | ICD-10-CM | POA: Insufficient documentation

## 2020-10-24 DIAGNOSIS — N183 Chronic kidney disease, stage 3 unspecified: Secondary | ICD-10-CM | POA: Diagnosis not present

## 2020-10-24 DIAGNOSIS — Z79899 Other long term (current) drug therapy: Secondary | ICD-10-CM | POA: Insufficient documentation

## 2020-10-24 HISTORY — DX: Disorder of thyroid, unspecified: E07.9

## 2020-10-24 LAB — COMPREHENSIVE METABOLIC PANEL
ALT: 32 U/L (ref 0–44)
AST: 23 U/L (ref 15–41)
Albumin: 3.6 g/dL (ref 3.5–5.0)
Alkaline Phosphatase: 117 U/L (ref 38–126)
Anion gap: 7 (ref 5–15)
BUN: 15 mg/dL (ref 8–23)
CO2: 22 mmol/L (ref 22–32)
Calcium: 9.2 mg/dL (ref 8.9–10.3)
Chloride: 110 mmol/L (ref 98–111)
Creatinine, Ser: 0.98 mg/dL (ref 0.44–1.00)
GFR, Estimated: 58 mL/min — ABNORMAL LOW (ref >60)
Glucose, Bld: 96 mg/dL (ref 70–99)
Potassium: 4.1 mmol/L (ref 3.5–5.1)
Sodium: 139 mmol/L (ref 135–145)
Total Bilirubin: 0.6 mg/dL (ref 0.3–1.2)
Total Protein: 7.6 g/dL (ref 6.5–8.1)

## 2020-10-24 LAB — URINALYSIS, ROUTINE W REFLEX MICROSCOPIC
Bilirubin Urine: NEGATIVE
Glucose, UA: NEGATIVE mg/dL
Hgb urine dipstick: NEGATIVE
Ketones, ur: NEGATIVE mg/dL
Leukocytes,Ua: NEGATIVE
Nitrite: NEGATIVE
Protein, ur: NEGATIVE mg/dL
Specific Gravity, Urine: 1.005 (ref 1.005–1.030)
pH: 7 (ref 5.0–8.0)

## 2020-10-24 LAB — CBC WITH DIFFERENTIAL/PLATELET
Abs Immature Granulocytes: 0.03 10*3/uL (ref 0.00–0.07)
Basophils Absolute: 0.1 10*3/uL (ref 0.0–0.1)
Basophils Relative: 1 %
Eosinophils Absolute: 0.2 10*3/uL (ref 0.0–0.5)
Eosinophils Relative: 2 %
HCT: 34.8 % — ABNORMAL LOW (ref 36.0–46.0)
Hemoglobin: 10.6 g/dL — ABNORMAL LOW (ref 12.0–15.0)
Immature Granulocytes: 0 %
Lymphocytes Relative: 15 %
Lymphs Abs: 1.4 10*3/uL (ref 0.7–4.0)
MCH: 25.5 pg — ABNORMAL LOW (ref 26.0–34.0)
MCHC: 30.5 g/dL (ref 30.0–36.0)
MCV: 83.7 fL (ref 80.0–100.0)
Monocytes Absolute: 0.7 10*3/uL (ref 0.1–1.0)
Monocytes Relative: 7 %
Neutro Abs: 7 10*3/uL (ref 1.7–7.7)
Neutrophils Relative %: 75 %
Platelets: 430 10*3/uL — ABNORMAL HIGH (ref 150–400)
RBC: 4.16 MIL/uL (ref 3.87–5.11)
RDW: 15 % (ref 11.5–15.5)
WBC: 9.3 10*3/uL (ref 4.0–10.5)
nRBC: 0 % (ref 0.0–0.2)

## 2020-10-24 MED ORDER — SODIUM CHLORIDE 0.9 % IV BOLUS
1000.0000 mL | Freq: Once | INTRAVENOUS | Status: AC
  Administered 2020-10-24: 1000 mL via INTRAVENOUS

## 2020-10-24 NOTE — Discharge Instructions (Signed)
Your evaluation today has been very reassuring, despite diarrhea your potassium levels are normal, your kidney function looks good and your liver tests are normal today as well.  Since you were just recently started on thyroid medication hypothyroidism may very likely be contributing to your generalized weakness and fatigue, and since you have only been on this medicine for a few days will have you follow-up closely with your PCP for recheck of your thyroid function levels once you have been on this medicine a bit longer.  Your urine shows no signs of infection today.  Continue to stay well-hydrated, follow-up as we discussed and return if you have any new or worsening symptoms.

## 2020-10-24 NOTE — ED Provider Notes (Signed)
[facility]-EMERGENCY DEPT Provider Note   CSN: [id number] Arrival date & time: 10/24/20  1106     History Chief Complaint  Patient presents with   Weakness   Urinary Tract Infection    Madison [name] is a 80 y.o. female.  Madison [name] is a 80 y.o. female with history of SVT, hypertension, hypothyroidism, GERD, CKD, chronic diarrhea, who presents to the emergency department for evaluation of generalized weakness.  Patient reports she has been feeling increasingly weak since the beginning of June.  She reports on 6/6 she saw her doctor and was diagnosed with a UTI, was prescribed Macrobid but this caused hives, she was then changed to Keflex, but this seemed to worsen her chronic diarrhea and so this medication is not completed as well.  She already has frequent diarrhea at baseline and is followed by Dr. Matthias Hughs with GI for this, but reports it has been worsening recently and she has been having multiple loose bowel movements a day.  Denies associated abdominal pain.  No fevers or chills.  She has not noted any blood in her stool.  She is concerned she could still have a urinary tract ongoing leading to her feeling weak.  She is not having dysuria or urinary frequency.  Denies chest pain or shortness of breath.  Husband reports she is having difficulty walking to the dining room at their assisted independent living facility, typically is able to walk with a walker without difficulty.  She recently saw her PCP and they noted some abnormal liver tests and were concerned one of her medications may be causing this.  Patient is worried she may be getting dehydrated from the increased diarrhea, her PCP has increased her potassium supplement due to increased diarrhea.  Patient was also recently diagnosed with hypothyroidism and started on levothyroxine about 4 days ago.  The history is provided by the patient and the spouse.      Past Medical History:  Diagnosis Date   Benign  hypertension with chronic kidney disease, stage III (HCC)    Overview:  Last Assessment & Plan:  Usually the patient is hypertensive. However today her blood pressure is 105 systolic. She has been feeling fatigued with some lightheadedness. Part of this may be related to her lower blood pressure. Her pressure may be improved with her decrease in salt and fluid intake. I've instructed her to put her lisinopril/hctz on hold. 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 It was possibly reentrant. She does well with diltiazem. No change in therapy.   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   Metabolic bone disease 09/01/2015   Vitamin D deficiency 09/01/2015   Chronic diarrhea 07/27/2015   Edema 07/27/2015   Edema of lower extremity 11/11/2014   Cardiac disease 03/10/2014   Heart disease 03/10/2014   Encounter for preprocedural  cardiovascular examination    Benign essential HTN    Gastroesophageal reflux disease    Adiposity    Supraventricular tachycardia (HCC)    Decreased cardiac output    Urinary incontinence     Past Surgical History:  Procedure Laterality Date   CATARACT EXTRACTION Left 8/11   CATARACT EXTRACTION Right 3/12   CHOLECYSTECTOMY  1994   Copsilotomy Laser Treatment Left 6/12   eye   ELBOW SURGERY  8/12   EYE SURGERY Left 09/2008   macular hole   EYE SURGERY Right 12/11   Lumbar Infusion  2011   MOLE REMOVAL  06/17/2015   TOTAL KNEE ARTHROPLASTY Left 6/11   TOTAL KNEE ARTHROPLASTY Right 06/13/2011     OB History   No obstetric history on file.     Family History  Problem Relation Age of Onset   High blood pressure Mother    Diabetes Mother    Heart Problems Father    Diabetes Father    Prostate cancer Father    Heart attack Father    Heart attack Paternal Grandmother    Heart attack Maternal Grandfather    Heart attack Paternal Uncle    Stroke Neg Hx     Social History   Tobacco Use   Smoking status: Former    Pack years: 0.00   Smokeless tobacco: Never  Vaping Use   Vaping Use: Never used  Substance Use Topics   Alcohol use: No   Drug use: No    Home Medications Prior to Admission medications   Medication Sig Start Date End Date Taking? Authorizing Provider  acetaminophen (TYLENOL) 500 MG tablet Take 500 mg by mouth every 6 (six) hours as needed for pain.   Yes [provider]  bifidobacterium infantis (ALIGN) capsule Take 1 capsule by mouth daily.   Yes [provider]  Calcium Carb-Cholecalciferol (CALCIUM PLUS VITAMIN D3 PO) Take 1 tablet by mouth daily. Vitamin D3 4000 + Calcium 800   Yes [provider]  Calcium Carbonate (CALCIUM 600 PO) Take 1 tablet by mouth daily.   Yes [provider]  colesevelam (WELCHOL) 625 MG tablet Take 625 mg by mouth 2 (two) times daily with a meal.   Yes [provider]   diltiazem (CARDIZEM CD) 180 MG 24 hr capsule Take 1 capsule (180 mg total) by mouth daily. 04/08/20  Yes [name], MD  fluocinonide cream (LIDEX) 0.05 % Apply 1 application topically daily as needed (rash).   Yes [provider]  furosemide (LASIX) 20 MG tablet Take 60 mg by mouth 2 (two) times daily.    Yes [provider]  gabapentin (NEURONTIN) 300 MG capsule Take 300 mg by mouth at bedtime. 03/02/20  Yes [provider]  hydroxychloroquine (PLAQUENIL) 200 MG tablet Take 1 tablet by mouth in the morning and at bedtime. 07/14/19  Yes [provider]  KLOR-CON M20 20 MEQ tablet TAKE 1 TABLET BY MOUTH  EVERY DAY Patient taking differently: Take 20 mEq by mouth every other day. 10/29/19  Yes Dyann Kief, PA-C  levothyroxine (SYNTHROID) 25 MCG tablet Take 25 mcg by mouth every morning. 09/30/20  Yes [provider]  nystatin-triamcinolone (MYCOLOG II) cream Apply 1 application topically daily as needed (rash).   Yes [provider]  omeprazole (PRILOSEC) 40 MG capsule Take 40 mg by mouth 3 (three) times a week.  09/16/14  Yes [provider]  Prenatal Vit-Fe Fumarate-FA (PRENATAL MULTIVITAMIN) TABS tablet Take 1 tablet by mouth daily at 12 noon.   Yes [provider]    Allergies    Keflex [cephalexin] and Macrobid [nitrofurantoin]  Review of Systems   Review of Systems  Constitutional:  Positive for fatigue. Negative for chills and fever.  HENT: Negative.    Respiratory:  Negative for cough and shortness of breath.   Cardiovascular:  Negative for chest pain, palpitations and leg swelling.  Gastrointestinal:  Positive for diarrhea. Negative for abdominal pain, blood in stool, nausea and vomiting.  Genitourinary:  Negative for dysuria, flank pain and frequency.  Musculoskeletal:  Negative for arthralgias and myalgias.  Skin:  Negative for color change and rash.  Neurological:  Positive for weakness (Generalized).  Negative for dizziness, syncope, light-headedness and numbness.  All other systems reviewed and are negative.  Physical Exam Updated Vital Signs BP (!) 156/70 (BP Location: Left Arm)   Pulse 69   Temp 98.1 F (36.7 C) (Oral)   Resp 18   Ht 5\' 8"  (1.727 m)   Wt 117.9 kg   SpO2 97%   BMI 39.53 kg/m   Physical Exam Vitals and nursing note reviewed.  Constitutional:      General: She is not in acute distress.    Appearance: Normal appearance. She is well-developed. She is obese. She is not ill-appearing or diaphoretic.  HENT:     Head: Normocephalic and atraumatic.     Mouth/Throat:     Mouth: Mucous membranes are moist.     Pharynx: Oropharynx is clear.  Eyes:     General:        Right eye: No discharge.        Left eye: No discharge.  Cardiovascular:     Rate and Rhythm: Normal rate and regular rhythm.     Pulses: Normal pulses.     Heart sounds: Normal heart sounds. No murmur heard.   No friction rub. No gallop.  Pulmonary:     Effort: Pulmonary effort is normal. No respiratory distress.     Breath sounds: Normal breath sounds. No wheezing or rales.     Comments: Respirations equal and unlabored, patient able to speak in full sentences, lungs clear to auscultation bilaterally  Abdominal:     General: Bowel sounds are normal. There is no distension.     Palpations: Abdomen is soft. There is no mass.     Tenderness: There is no abdominal tenderness. There is no guarding.     Comments: Abdomen soft, nondistended, nontender to palpation in all quadrants without guarding or peritoneal signs  Musculoskeletal:        General: No deformity.     Cervical back: Neck supple.     Right lower leg: No edema.     Left lower leg: No edema.  Skin:    General: Skin is warm and dry.     Capillary Refill: Capillary refill takes less than 2 seconds.  Neurological:     Mental Status: She  is alert and oriented to person, place, and time.     Coordination: Coordination normal.      Comments: Speech is clear, able to follow commands CN III-XII intact Normal strength in upper and lower extremities bilaterally including dorsiflexion and plantar flexion, strong and equal grip strength Sensation normal to light and sharp touch Moves extremities without ataxia, coordination intact  Psychiatric:        Mood and Affect: Mood normal.        Behavior: Behavior normal.    ED Results / Procedures / Treatments   Labs (all labs ordered are listed, but only abnormal results are displayed) Labs Reviewed  COMPREHENSIVE METABOLIC PANEL - Abnormal; Notable for the following components:      Result Value   GFR, Estimated 58 (*)    All other components within normal limits  CBC WITH DIFFERENTIAL/PLATELET - Abnormal; Notable for the following components:   Hemoglobin 10.6 (*)    HCT 34.8 (*)    MCH 25.5 (*)    Platelets 430 (*)    All other components within normal limits  URINALYSIS, ROUTINE W REFLEX MICROSCOPIC - Abnormal; Notable for the following components:   Color, Urine STRAW (*)    All other components within normal limits  URINE CULTURE  C DIFFICILE QUICK SCREEN W PCR REFLEX      EKG EKG Interpretation  Date/Time:  Saturday October 24 2020 13:02:04 EDT Ventricular Rate:  71 PR Interval:  178 QRS Duration: 100 QT Interval:  398 QTC Calculation: 432 R Axis:   68 Text Interpretation: Normal sinus rhythm Nonspecific ST abnormality Abnormal ECG Confirmed by Pricilla Loveless [phone]) on 10/24/2020 1:14:51 PM  Radiology DG Chest Port 1 View  Result Date: 10/24/2020 CLINICAL DATA:  Weakness. EXAM: PORTABLE CHEST 1 VIEW COMPARISON:  12/30/2009 FINDINGS: Single view of the chest demonstrates clear lungs. Heart and mediastinum are within normal limits. Negative for a pneumothorax. Degenerative changes in the right shoulder. Degenerative changes in the lower cervical spine. Old right fourth rib fracture. IMPRESSION: No acute cardiopulmonary disease. Electronically Signed   By:  Richarda Overlie M.D.   On: 10/24/2020 13:17    Procedures Procedures   Medications Ordered in ED Medications  sodium chloride 0.9 % bolus 1,000 mL (0 mLs Intravenous Stopped 10/24/20 1756)    ED Course  I have reviewed the triage vital signs and the nursing notes.  Pertinent labs & imaging results that were available during my care of the patient were reviewed by me and considered in my medical decision making (see chart for details).    MDM Rules/Calculators/A&P                         80 year old female presents with generalized weakness progressively worsening over the past few weeks.  No associated fevers or chills.  On arrival patient is overall well-appearing, mildly hypertensive but vitals otherwise normal.  She has a known history of chronic diarrhea which has been a bit worse than usual.  She was also partially treated for a UTI at the beginning of the month but had to stop 2 different antibiotics due to side effects or allergic reaction.  Patient with no focal weakness or neurologic deficits.  Suspect potential dehydration or electrolyte derangement given ongoing diarrhea, could also potentially have ongoing UTI.  Patient was also recently diagnosed with hypothyroidism and has only been on levothyroxine for 4 days this could also certainly be contributing to weakness.  I have  independently ordered, reviewed and interpreted all labs and imaging: CBC: No leukocytosis, hemoglobin stable CMP: No electrolyte derangements, normal renal and liver function, potassium supplementation in the setting of diarrhea has been appropriate UA: no nitrites or leukocytes to suggest infection  She has not had any further diarrhea during the ED, initially ordered C. difficile testing, but feel this is unlikely with persistent diarrhea.  This seems to primarily be related to food intake.  Chest x-ray: No active cardiopulmonary disease EKG: Normal sinus rhythm with some nonspecific ST changes, no associated  chest pain or concern for ACS.  Patient given IV fluids and on reevaluation she reports she is feeling much better.  She was able to ambulate at her baseline with a walker without assistance.  Suspect weakness may be primarily related to recently diagnosed hypothyroidism.  Has only been on medication for a few days so will not recheck TSH at this time but encourage patient to follow closely with her doctor.  Strict return precautions discussed.  Discharged home in good condition.  Final Clinical Impression(s) / ED Diagnoses Final diagnoses:  Generalized weakness    Rx / DC Orders ED Discharge Orders     None        Legrand Rams 10/26/20 1025    Derwood Kaplan, MD 10/26/20 1050

## 2020-10-24 NOTE — ED Notes (Signed)
Pt placed on a purewick 

## 2020-10-24 NOTE — ED Triage Notes (Signed)
Pt reports being diagnosed with UTI 6/6 after going to doctor for vaginal itching and given Macrobid, but she broke out in hives. Then she started taking Keflex, but began having diarrhea and stopped taking it. Pt endorses generalized weakness since the beginning of June. Pt was also recently just started and Levothyroxine, and her PCP is concerned that one of her medications is causing her liver failure. Pt has hx of stage 3 kidney failure.

## 2020-10-24 NOTE — ED Notes (Signed)
Patient ambulated about 20 ft with a walker. She needed minimal to no assistance from staff to do so. She stated she was weak, but much better than she was at home.

## 2020-10-27 LAB — URINE CULTURE: Culture: >=100000 — AB

## 2020-10-28 ENCOUNTER — Telehealth: Payer: Self-pay | Admitting: Emergency Medicine

## 2020-10-28 NOTE — Telephone Encounter (Signed)
Post ED Visit - Positive Culture Follow-up  Culture report reviewed by antimicrobial stewardship pharmacist: [facility] [x]  , Pharm.D. []  Enzo Bi, Pharm.D., BCPS AQ-ID []  , Pharm.D., BCPS []  Celedonio Miyamoto, Pharm.D., BCPS []  [place], Garvin Fila.D., BCPS, AAHIVP []  , Pharm.D., BCPS, AAHIVP []  Georgina Pillion, PharmD, BCPS []  , PharmD, BCPS []  [place], PharmD, BCPS []  [address], PharmD []  , PharmD, BCPS []  Estella Husk, PharmD  Pharmacy Team []  Lysle Pearl, PharmD []  , PharmD []  Phillips Climes, PharmD []  , Rph []  Agapito Games) , PharmD []  Verlan Friends, PharmD []  , PharmD []  Mervyn Gay, PharmD []  , PharmD []  Vinnie Level, PharmD []  [facility], PharmD []  , PharmD []  Len Childs, PharmD   Positive urine culture Treated with none, asymptomatic, no further patient follow-up is required at this time.  10/28/2020, 10:03 AM

## 2020-10-28 NOTE — Progress Notes (Signed)
ED Antimicrobial Stewardship Positive Culture Follow Up   Madison [name] is an 80 y.o. female who presented to [facility] on 10/24/2020 with a chief complaint of  Chief Complaint  Patient presents with   Weakness   Urinary Tract Infection    Recent Results (from the past 720 hour(s))  Urine culture     Status: Abnormal   Collection Time: 10/24/20  4:20 PM   Specimen: Urine, Random  Result Value Ref Range Status   Specimen Description   Final    URINE, RANDOM Performed at [facility], [address], [place], [place] [address]    Special Requests   Final    NONE Performed at [facility], [address], [place], [place] [address]    Culture >=100,000 COLONIES/mL ESCHERICHIA COLI (A)  Final   Report Status 10/27/2020 FINAL  Final   Organism ID, Bacteria ESCHERICHIA COLI (A)  Final      Susceptibility   Escherichia coli - MIC*    AMPICILLIN >=32 RESISTANT Resistant     CEFAZOLIN <=4 SENSITIVE Sensitive     CEFEPIME <=0.12 SENSITIVE Sensitive     CEFTRIAXONE <=0.25 SENSITIVE Sensitive     CIPROFLOXACIN >=4 RESISTANT Resistant     GENTAMICIN <=1 SENSITIVE Sensitive     IMIPENEM <=0.25 SENSITIVE Sensitive     NITROFURANTOIN <=16 SENSITIVE Sensitive     TRIMETH/SULFA <=20 SENSITIVE Sensitive     AMPICILLIN/SULBACTAM >=32 RESISTANT Resistant     PIP/TAZO <=4 SENSITIVE Sensitive     * >=100,000 COLONIES/mL ESCHERICHIA COLI    Fatigue and weakness improved with IV fluids. UA clean. No urinary symptoms. Asymptomatic bacteriuria.  No treatment necessary  ED Provider: Susy Frizzle MD  [name] 10/28/2020, 9:51 AM

## 2020-10-29 ENCOUNTER — Encounter: Payer: Self-pay | Admitting: *Deleted

## 2020-11-16 ENCOUNTER — Ambulatory Visit: Payer: Medicare Other | Admitting: Cardiology

## 2020-11-25 ENCOUNTER — Other Ambulatory Visit: Payer: Self-pay | Admitting: Physician Assistant

## 2020-12-03 ENCOUNTER — Other Ambulatory Visit: Payer: Self-pay | Admitting: Chiropractor

## 2020-12-03 ENCOUNTER — Other Ambulatory Visit: Payer: Self-pay

## 2020-12-03 ENCOUNTER — Ambulatory Visit
Admission: RE | Admit: 2020-12-03 | Discharge: 2020-12-03 | Disposition: A | Discharge status: Home / Self Care | Payer: Medicare Other | Source: Ambulatory Visit | Attending: Chiropractor | Admitting: Chiropractor

## 2020-12-03 DIAGNOSIS — R52 Pain, unspecified: Secondary | ICD-10-CM

## 2020-12-03 DIAGNOSIS — M545 Low back pain, unspecified: Secondary | ICD-10-CM

## 2020-12-04 ENCOUNTER — Encounter: Payer: Self-pay | Admitting: Family Medicine

## 2020-12-04 ENCOUNTER — Ambulatory Visit: Payer: Medicare Other | Admitting: Family Medicine

## 2020-12-04 DIAGNOSIS — M545 Low back pain, unspecified: Secondary | ICD-10-CM

## 2020-12-04 DIAGNOSIS — G8929 Other chronic pain: Secondary | ICD-10-CM

## 2020-12-04 MED ORDER — HYDROCODONE-ACETAMINOPHEN 5-325 MG PO TABS: 1.0000 | ORAL_TABLET | Freq: Four times a day (QID) | ORAL | 0 refills | Status: DC | PRN

## 2020-12-04 NOTE — Progress Notes (Signed)
Office Visit Note   Patient: Madison [name]           Date of Birth: 04-17-1941           MRN: [id number] Visit Date: 12/04/2020 Requested by: [name], MD [address] [place],  [place] [address] PCP: [name], MD  Subjective: Chief Complaint  Patient presents with   Lower Back - Pain    Pain in the right leg x 3 months. The pain has been worsening. Cannot bear weight now, due to the pain. Has not been able to get in to see her neurosurgeon, Dr. Marikay Alar. She does have an appointment next week with his PA. Has had back injections in the past -- has been a while.     HPI: She is here at the request of Dr. Mayford Knife for right leg pain.  Severe pain for the past 3 months, no definite injury.  She is status post bilateral knee replacement, and she had her right hip replaced in 2018 per Dr. Christell Constant in [facility].  She saw Dr. Yetta Barre a few months ago, her neurosurgeon.  She has been doing physical therapy at friend's home.  At first it was helping but now it is not.  She recently started seeing Dr. Mayford Knife office for chiropractic treatments.  They ordered new x-rays yesterday and were concerned about a possible lumbar compression fracture and she now presents for evaluation.  Tramadol is not helping.  She is in severe pain, hardly able to bear weight.                ROS:   All other systems were reviewed and are negative.  Objective: Vital Signs: There were no vitals taken for this visit.  Physical Exam:  General:  Alert and oriented, in no acute distress. Pulm:  Breathing unlabored. Psy:  Normal mood, congruent affect.  Low back: She is diffusely tender to palpation over her lumbar spinous processes. Right leg: Exquisitely painful and very limited range of motion when I passively flex and internally rotate her hip.  Neurologically intact.    Imaging: X-rays from yesterday viewed on computer reveal the acetabular component of her right hip prosthesis is medially  displaced and pushing up into her pelvis.  Lumbar x-rays show an L2 compression deformity which appears unchanged from CT images in November 2021.    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 -I discussed her case with Dr. Roda Shutters.  Patient does not want to return to Dr. Christell Constant now that she lives here in [place].  I will refer her to Dr. Linna Caprice for evaluation and treatment of her right hip. -Hydrocodone given for pain.     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   Metabolic bone disease 09/01/2015   Vitamin D deficiency 09/01/2015   Chronic diarrhea 07/27/2015   Edema 07/27/2015   Edema of lower extremity 11/11/2014   Cardiac disease 03/10/2014   Heart disease 03/10/2014   Encounter for preprocedural cardiovascular examination    Benign essential HTN    Gastroesophageal reflux disease    Adiposity    Supraventricular tachycardia (HCC)    Decreased cardiac output  Urinary incontinence    Past Medical History:  Diagnosis Date   Benign hypertension with chronic kidney disease, stage III (HCC)    Overview:  Last Assessment & Plan:  Usually the patient is hypertensive. However today her blood pressure is 105 systolic. She has been feeling fatigued with some lightheadedness. Part of this may be related to her lower blood pressure. Her pressure may be improved with her decrease in salt and fluid intake. I've instructed her to put her lisinopril/hctz on hold. I've asked her to see her primary    Cardiac disease 03/10/2014   Chronic diarrhea 07/27/2015   Edema 07/27/2015   Ejection fraction 2004   Normal, echo,    Gastroesophageal reflux disease    GERD (gastroesophageal reflux disease)     Heart disease 03/10/2014   Hypertension    Left bundle branch block 09/01/2020   Obesity    Obesity (BMI 30-39.9) 09/25/2017   OSA (obstructive sleep apnea)    mild with AHI 6.75 - on CPAP   Preop cardiovascular exam 11/2010   Cardiac clearance for knee surgery    Sleep apnea 03/10/2014   Supraventricular tachycardia (HCC)    Documented episode in the past, possibly reentrant tachycardia  Overview:  Overview:  Documented episode in the past, 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 It was possibly reentrant. She does well with diltiazem. No change in therapy.   SVT (supraventricular tachycardia) (HCC)    Documented episode in the past, possibly reentrant tachycardia   Thyroid disease    Urinary incontinence     Family History  Problem Relation Age of Onset   High blood pressure Mother    Diabetes Mother    Heart Problems Father    Diabetes Father    Prostate cancer Father    Heart attack Father    Heart attack Paternal Grandmother    Heart attack Maternal Grandfather    Heart attack Paternal Uncle    Stroke Neg Hx     Past Surgical History:  Procedure Laterality Date   CATARACT EXTRACTION Left 8/11   CATARACT EXTRACTION Right 3/12   CHOLECYSTECTOMY  1994   Copsilotomy Laser Treatment Left 6/12   eye   ELBOW SURGERY  8/12   EYE SURGERY Left 09/2008   macular hole   EYE SURGERY Right 12/11   Lumbar Infusion  2011   MOLE REMOVAL  06/17/2015   TOTAL KNEE ARTHROPLASTY Left 6/11   TOTAL KNEE ARTHROPLASTY Right 06/13/2011   Social History   Occupational History   Not on file  Tobacco Use   Smoking status: Former   Smokeless tobacco: Never  Vaping Use   Vaping Use: Never used  Substance and Sexual Activity   Alcohol use: No   Drug use: No   Sexual activity: Not on file

## 2020-12-07 ENCOUNTER — Telehealth: Payer: Self-pay | Admitting: Family Medicine

## 2020-12-07 NOTE — Telephone Encounter (Signed)
I called: Dr. Kathline Magic office has not received the referral yet. That office gave the patient's husband the correct fax #[phone], in case we did not have that. I have passed this information on to the referral coordinator. That office also said they would need the medical records from the doctor that did her hip replacement. The patient's husband is taking care of getting them. I advised him I would check on the status of sending the referral.

## 2020-12-07 NOTE — Telephone Encounter (Signed)
Pt husband called and states that his wife is suppose to have a referral to Dr. Arlys John swinteck. She is in a lot of pain and he doesn't know what to do. Please call him  [phone]

## 2020-12-17 ENCOUNTER — Telehealth: Payer: Self-pay | Admitting: *Deleted

## 2020-12-17 NOTE — Telephone Encounter (Signed)
   [facility] Pre-operative Risk Assessment    Patient Name: DOSHIA DALIA  DOB: Sep 25, 1940 MRN: [id number]  HEARTCARE STAFF:  - IMPORTANT!!!!!! Under Visit Info/Reason for Call, type in Other and utilize the format Clearance MM/DD/YY or Clearance TBD. Do not use dashes or single digits. - Please review there is not already an duplicate clearance open for this procedure. - If request is for dental extraction, please clarify the # of teeth to be extracted. - If the patient is currently at the dentist's office, call Pre-Op Callback Staff (MA/nurse) to input urgent request.  - If the patient is not currently in the dentist office, please route to the Pre-Op pool.  Request for surgical clearance:  What type of surgery is being performed? 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 12/29/20  What type of clearance is required (medical clearance vs. Pharmacy clearance to hold med vs. Both)? MEDICAL  Are there any medications that need to be held prior to surgery and how long? NONE LISTED   Practice name and name of physician performing surgery? [facility]; DR. Jaynee Eagles  What is the office phone number? [phone]   7.   What is the office fax number? [place]  8.   Anesthesia type (None, local, MAC, general) ? NOT LISTED, TRIED TO CALL REQUESTING OFFICE THOUGH ON HOLD FOR OVER 6 MINUTES WITH NO ONE ANSWERING THE CALL.    Julaine Hua 12/17/2020, 2:22 PM  _________________________________________________________________   (provider comments below)

## 2020-12-17 NOTE — Telephone Encounter (Signed)
    Patient Name: Madison [name]  DOB: 07-Apr-1941 MRN: [id number]  Primary Cardiologist: Dr. Laurance Flatten, MD   Chart reviewed as part of pre-operative protocol coverage. Given past medical history and time since last visit, based on ACC/AHA guidelines, Ijanae L Stupka would be at acceptable risk for the planned procedure without further cardiovascular testing.   She was recently seen in follow up and was doing well with no CV symptoms. She has undergone Myoview stress imaging as well as an echocardiogram which showed evidence of ischemia and LVEF preserved with no valvular disease.   The patient was advised that if she develops new symptoms prior to surgery to contact our office to arrange for a follow-up visit, and she verbalized understanding.  I will route this recommendation to the requesting party via Epic fax function and remove from pre-op pool.  Please call with questions.  Georgie Chard, NP 12/17/2020, 3:04 PM

## 2021-02-05 ENCOUNTER — Encounter: Payer: Self-pay | Admitting: Nurse Practitioner

## 2021-02-05 ENCOUNTER — Non-Acute Institutional Stay (SKILLED_NURSING_FACILITY): Payer: Medicare Other | Admitting: Nurse Practitioner

## 2021-02-05 DIAGNOSIS — K2100 Gastro-esophageal reflux disease with esophagitis, without bleeding: Secondary | ICD-10-CM

## 2021-02-05 DIAGNOSIS — M25551 Pain in right hip: Secondary | ICD-10-CM

## 2021-02-05 DIAGNOSIS — K529 Noninfective gastroenteritis and colitis, unspecified: Secondary | ICD-10-CM

## 2021-02-05 DIAGNOSIS — D500 Iron deficiency anemia secondary to blood loss (chronic): Secondary | ICD-10-CM

## 2021-02-05 DIAGNOSIS — B3789 Other sites of candidiasis: Secondary | ICD-10-CM

## 2021-02-05 DIAGNOSIS — R21 Rash and other nonspecific skin eruption: Secondary | ICD-10-CM | POA: Insufficient documentation

## 2021-02-05 DIAGNOSIS — F5105 Insomnia due to other mental disorder: Secondary | ICD-10-CM

## 2021-02-05 DIAGNOSIS — R600 Localized edema: Secondary | ICD-10-CM

## 2021-02-05 DIAGNOSIS — N1831 Chronic kidney disease, stage 3a: Secondary | ICD-10-CM

## 2021-02-05 DIAGNOSIS — I10 Essential (primary) hypertension: Secondary | ICD-10-CM

## 2021-02-05 DIAGNOSIS — G4733 Obstructive sleep apnea (adult) (pediatric): Secondary | ICD-10-CM

## 2021-02-05 DIAGNOSIS — E669 Obesity, unspecified: Secondary | ICD-10-CM

## 2021-02-05 DIAGNOSIS — F418 Other specified anxiety disorders: Secondary | ICD-10-CM

## 2021-02-05 DIAGNOSIS — A419 Sepsis, unspecified organism: Secondary | ICD-10-CM | POA: Insufficient documentation

## 2021-02-05 DIAGNOSIS — L270 Generalized skin eruption due to drugs and medicaments taken internally: Secondary | ICD-10-CM | POA: Insufficient documentation

## 2021-02-05 DIAGNOSIS — M069 Rheumatoid arthritis, unspecified: Secondary | ICD-10-CM

## 2021-02-05 DIAGNOSIS — E039 Hypothyroidism, unspecified: Secondary | ICD-10-CM | POA: Insufficient documentation

## 2021-02-05 NOTE — Assessment & Plan Note (Signed)
Uses CPAP 

## 2021-02-05 NOTE — Assessment & Plan Note (Signed)
post op, s/p EGD no active bleed. S/p 6 u PRBC transfusion, ASA was dc'd, Hgb 8s. Suggested Fe 3x/wk if the patient agrees.

## 2021-02-05 NOTE — Assessment & Plan Note (Signed)
Morbid obesity  

## 2021-02-05 NOTE — Assessment & Plan Note (Signed)
Blood pressure is controlled, continue  Amlodipine, Furosemide  

## 2021-02-05 NOTE — Assessment & Plan Note (Signed)
prn Imodium, Align, Welchol °

## 2021-02-05 NOTE — Assessment & Plan Note (Signed)
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

## 2021-02-05 NOTE — Assessment & Plan Note (Signed)
Apply Nystatin powder bid to under arms, thighs, back x weeks. Diflucan 100mg  qd x 3 days.

## 2021-02-05 NOTE — Assessment & Plan Note (Signed)
nausea, vomiting, CT abd/pelvis r/o for obstruction. takes Pantoprazole, prn Zofran.

## 2021-02-05 NOTE — Assessment & Plan Note (Signed)
may resume Plaquenil  

## 2021-02-05 NOTE — Assessment & Plan Note (Signed)
takes Levothyroxine qd, update TSH

## 2021-02-05 NOTE — Progress Notes (Addendum)
Location:   [facility] Room Number: 37 A Place of Service:  SNF (31) Provider:  Gabrielle Mester X, NP  Cari Caraway, MD  Patient Care Team: Cari Caraway, MD as PCP - General (Family Medicine) Dorothy Spark, MD (Inactive) as PCP - Cardiology (Cardiology) Sueanne Margarita, MD as PCP - Sleep Medicine (Cardiology) Donzetta Sprung., MD (Sports Medicine) Linward Natal, MD (Ophthalmology) [name], Katharine Look, MD (Dermatology) Ronald Lobo, MD (Gastroenterology) Thereasa Distance, MD (Nephrology) Jola Baptist, DC as Referring Physician (Chiropractic Medicine)  Extended Emergency Contact Information Primary Emergency ContactSTEPHANIEMARIE, Madison [name] Home Phone: [phone] Relation: Spouse  Code Status:  FULL CODE Goals of care: Advanced Directive information Advanced Directives 02/09/2021  Does Patient Have a Medical Advance Directive? Yes  Type of Advance Directive Living will  Does patient want to make changes to medical advance directive? No - Patient declined  Copy of [place] in Chart? -     Chief Complaint  Patient presents with   Acute Visit    Medication review    HPI:  Pt is a 80 y.o. female seen today for an acute visit for medication review following hospitalization 01/07/21-02/04/21 for failure of R total hip arthroplasty, infection of prosthetic total hip joint, hardware removal, post op was complicated with severe sepsis, acute respiratory failure required intubation, acute kidney failure.   The patient was admitted to [facility] for therapy, nursing care.   R hip pain, s/p right THR 2018, removal of hardware 12/29/20,  takes Tylenol, Gabapentin, Tramadol. F/u Ortho 6 weeks.  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   Anemia, post op, s/p EGD no active bleed. S/p 6 u PRBC transfusion, ASA was dc'd, Hgb 8s  HTN takes Amlodipine,   Morbid obesity  OSA CPAP  Chronic diarrhea, prn Imodium, Align, Welchol  RA may resume Plaquenil  Insomnia/depression/anxiety, takes Mirtazapine  GERD, nausea, vomiting, CT abd/pelvis r/o for obstruction. takes Pantoprazole, prn Zofran.   Edema, BLE, takes Furosemide 79m bid  Hypothyroidism, takes Levothyroxine 237m qd     Past Medical History:  Diagnosis Date   Benign hypertension with chronic kidney disease, stage III [place]   Overview:  Last Assessment & Plan:  Usually the patient is hypertensive. However today her blood pressure is 10222ystolic. She has been feeling fatigued with some lightheadedness. Part of this may be related to her lower blood pressure. Her pressure may be improved with her decrease in salt and fluid intake. I've instructed her to put her lisinopril/hctz on hold. I've asked her to see her primary    Cardiac disease 03/10/2014   Chronic diarrhea 07/27/2015   Edema 07/27/2015   Ejection fraction 2004   Normal, echo,    Gastroesophageal reflux disease    GERD (gastroesophageal reflux disease)    Heart disease 03/10/2014   Hypertension    Left bundle branch block 09/01/2020   Obesity    Obesity (BMI 30-39.9) 09/25/2017   OSA (obstructive sleep apnea)    mild with AHI 6.75 - on CPAP   Preop cardiovascular exam 11/2010   Cardiac clearance for knee surgery    Sleep apnea 03/10/2014   Sup[place]ricular tachycardia [place]   Documented episode in the past, possibly  reentrant tachycardia  Overview:  Overview:  Documented episode in the past, 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 It was possibly reentrant. She does well with diltiazem. No change in therapy.   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 If you have any questions, ask your nurse or doctor.          STOP taking these medications    CALCIUM 600 PO Stopped by: [place] Pettengill X Tiarna Koppen, NP   colesevelam 625 MG tablet Commonly known as: WELCHOL Stopped by: Milik Gilreath X Takeem Krotzer, NP   diltiazem 180 MG 24 hr capsule Commonly known as: CARDIZEM CD Stopped by: Syra Sirmons X Keni Elison, NP   fluocinonide cream 0.05 % Commonly known as: LIDEX Stopped by: Puanani Gene X Raelie Lohr, NP   HYDROcodone-acetaminophen 5-325 MG tablet Commonly known as: NORCO/VICODIN Stopped by: Dmarius Reeder X Deandra Gadson, NP   hydroxychloroquine 200 MG tablet Commonly known as: PLAQUENIL Stopped by: Lakrista Scaduto X Wakisha Alberts, NP   Klor-Con M20 20 MEQ tablet Generic drug: potassium chloride SA Stopped by: Breniya Goertzen X Servando Kyllonen, NP   nystatin-triamcinolone cream Commonly known as: MYCOLOG II Stopped by: Trejuan Matherne X Yukie Bergeron, NP   omeprazole 40 MG capsule Commonly known as: PRILOSEC Stopped by: Ladona Rosten X Virda Betters, NP   prenatal multivitamin Tabs tablet Stopped by: Braian Tijerina X Kendre Jacinto, NP       TAKE these medications    Acetaminophen 325 MG Chew Chew 650 mg by mouth every 6 (six) hours as needed for pain.   amLODipine 10 MG  tablet Commonly known as: NORVASC Take 10 mg by mouth daily.   bifidobacterium infantis capsule Take 1 capsule by mouth daily.   CALCIUM PLUS VITAMIN D3 PO Take 1 tablet by mouth daily. Vitamin D3 20mg + Calcium 600   furosemide 20 MG tablet Commonly known as: LASIX Take 60 mg by mouth 2 (two) times daily.   gabapentin 300 MG capsule Commonly known as: NEURONTIN Take 300 mg by mouth at bedtime.   levothyroxine 25 MCG tablet Commonly known as: SYNTHROID Take 25 mcg by mouth daily before breakfast. Take (2)tabs = 50 mcg; oral every other day   levothyroxine 25 MCG tablet Commonly known as: SYNTHROID Take 25 mcg by mouth every morning. Every other day   loperamide 2 MG capsule Commonly known as: IMODIUM Take by mouth.   melatonin 1 MG Tabs tablet Take by mouth.   mirtazapine 15 MG tablet Commonly known as: REMERON Take 15 mg by mouth  at bedtime.   ondansetron 4 MG disintegrating tablet Commonly known as: ZOFRAN-ODT Take by mouth.   traMADol 50 MG tablet Commonly known as: ULTRAM Take 50 mg by mouth every 6 (six) hours as needed. What changed: Another medication with the same name was removed. Continue taking this medication, and follow the directions you see here. Changed by: Jazier Mcglamery X Camron Monday, NP   Vitamin D (Ergocalciferol) 50 MCG (2000 UT) Caps Take by mouth daily.        Review of Systems  Constitutional:  Positive for fatigue. Negative for appetite change and fever.  HENT:  Negative for trouble swallowing.   Eyes:  Negative for visual disturbance.  Respiratory:  Negative for cough, shortness of breath and wheezing.   Gastrointestinal:  Negative for abdominal pain, constipation, nausea and vomiting.  Genitourinary:  Negative for dysuria.  Musculoskeletal:  Positive for arthralgias, gait problem and joint swelling.  Skin:  Positive for rash.  Neurological:  Negative for speech difficulty, weakness and headaches.  Psychiatric/Behavioral:  Negative for  confusion and sleep disturbance. The patient is not nervous/anxious.    Immunization History  Administered Date(s) Administered   Influenza, High Dose Seasonal PF 01/11/2017, 01/30/2018   Moderna Sars-Covid-2 Vaccination 05/09/2019, 05/27/2019, 03/03/2020, 11/04/2020   Pfizer Covid-19 Vaccine Bivalent Booster 39yr & up 02/09/2021   Zoster Recombinat (Shingrix) 01/11/2017   Pertinent  Health Maintenance Due  Topic Date Due   INFLUENZA VACCINE  11/23/2020   DEXA SCAN  Completed   No flowsheet data found. Functional Status Survey:    Vitals:   02/05/21 0941  BP: (!) 152/60  Pulse: 77  Resp: 18  Temp: (!) 96.9 F (36.1 C)  SpO2: 96%  Height: _0  (1.727 m)   Body mass index is 39.53 kg/m. Physical Exam Vitals and nursing note reviewed.  Constitutional:      Appearance: She is obese.  HENT:     Head: Normocephalic and atraumatic.     Nose: Nose normal.     Mouth/Throat:     Mouth: Mucous membranes are moist.  Eyes:     Extraocular Movements: Extraocular movements intact.     Conjunctiva/sclera: Conjunctivae normal.     Pupils: Pupils are equal, round, and reactive to light.  Cardiovascular:     Rate and Rhythm: Normal rate and regular rhythm.     Heart sounds: No murmur heard. Pulmonary:     Effort: Pulmonary effort is normal.  Abdominal:     General: Bowel sounds are normal.     Palpations: Abdomen is soft.     Tenderness: There is no abdominal tenderness.  Musculoskeletal:        General: Swelling and tenderness present.     Cervical back: Normal range of motion and neck supple.     Right lower leg: Edema present.     Left lower leg: Edema present.     Comments: R hip. Dependent edema in hips/thighs.   Skin:    General: Skin is warm and dry.     Findings: Rash present.     Comments: Right hip surgical scar. Diffused redness under arms, thighs, maculopapular rash on back.   Neurological:     General: No focal deficit present.     Mental Status: She is  alert.  Psychiatric:        Mood and Affect: Mood normal.        Behavior: Behavior normal.        Thought Content: Thought content normal.  Judgment: Judgment normal.    Labs reviewed: Recent Labs    10/24/20 1308 02/08/21 0000  NA 139 140  K 4.1 3.0*  CL 110 107  CO2 22 24*  GLUCOSE 96  --   BUN 15 11  CREATININE 0.98 1.2*  CALCIUM 9.2 7.5*   Recent Labs    10/24/20 1308 02/08/21 0000  AST 23 36*  ALT 32 24  ALKPHOS 117  --   BILITOT 0.6  --   PROT 7.6  --   ALBUMIN 3.6 2.8*   Recent Labs    10/24/20 1308 02/08/21 0000  WBC 9.3 8.2  NEUTROABS 7.0  --   HGB 10.6* 9.9*  HCT 34.8* 31*  MCV 83.7  --   PLT 430*  --    Lab Results  Component Value Date   TSH 4.520 (H) 03/29/2018   No results found for: HGBA1C No results found for: CHOL, HDL, LDLCALC, LDLDIRECT, TRIG, CHOLHDL  Significant Diagnostic Results in last 30 days:  No results found.  Assessment/Plan Right hip pain R hip pain, s/p right THR 2018, removal of hardware 12/29/20,  takes Tylenol, Gabapentin, Tramadol. F/u Ortho 6 weeks.   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 S/p 6 u PRBC transfusion, ASA was dc'd, Hgb 8s. Suggested Fe 3x/wk if the patient agrees.   Benign essential HTN Blood pressure is controlled, continue Amlodipine, Furosemide.   Obesity (BMI 30-39.9) Morbid obesity  Chronic diarrhea prn Imodium, Align, Welchol  Rheumatoid arthritis ([place]) may resume Plaquenil  Insomnia secondary to depression with anxiety  takes Mirtazapine  Gastroesophageal reflux disease nausea, vomiting, CT abd/pelvis r/o for obstruction. takes Pantoprazole, prn Zofran.   OSA (obstructive sleep apnea) Uses CPAP  Edema of lower extremity Continue  Furosemide  Hypothyroidism takes Levothyroxine 60mg qd, update TSH  [place]ida rash of groin Apply Nystatin powder bid to under arms, thighs, back x weeks. Diflucan 1090mqd x 3 days.   CKD (chronic kidney disease) 10/24/22Na 137, K 3.9, Bun 31, creat 1.21, AST 271, ALT 253, ESR 29, CRP 1.2, wbc 10.4, Hgb 10.7, plt 413, neutrophils 55.2    Family/ staff Communication: plan of care reviewed with the patient and charge nurse.   Labs/tests ordered:   CBC, CMP, ESR, CRP every Monday. TSH next Monday   Time spend 35 minutes.

## 2021-02-05 NOTE — Assessment & Plan Note (Signed)
takes Mirtazapine  

## 2021-02-05 NOTE — Assessment & Plan Note (Addendum)
R hip pain, s/p right THR 2018, removal of hardware 12/29/20,  takes Tylenol, Gabapentin, Tramadol. F/u Ortho 6 weeks.

## 2021-02-05 NOTE — Assessment & Plan Note (Signed)
Continue Furosemide.

## 2021-02-08 LAB — BASIC METABOLIC PANEL
BUN: 11 (ref 4–21)
CO2: 24 — AB (ref 13–22)
Chloride: 107 (ref 99–108)
Creatinine: 1.2 — AB (ref 0.5–1.1)
Glucose: 86
Potassium: 3 — AB (ref 3.4–5.3)
Sodium: 140 (ref 137–147)

## 2021-02-08 LAB — CBC: RBC: 3.72 — AB (ref 3.87–5.11)

## 2021-02-08 LAB — COMPREHENSIVE METABOLIC PANEL
Albumin: 2.8 — AB (ref 3.5–5.0)
Calcium: 7.5 — AB (ref 8.7–10.7)
Globulin: 3.2

## 2021-02-08 LAB — CBC AND DIFFERENTIAL
HCT: 31 — AB (ref 36–46)
Hemoglobin: 9.9 — AB (ref 12.0–16.0)
WBC: 8.2

## 2021-02-08 LAB — POCT ERYTHROCYTE SEDIMENTATION RATE, NON-AUTOMATED: Sed Rate: 63

## 2021-02-08 LAB — HEPATIC FUNCTION PANEL
ALT: 24 (ref 7–35)
AST: 36 — AB (ref 13–35)
Bilirubin, Total: 0.4

## 2021-02-09 ENCOUNTER — Encounter: Payer: Self-pay | Admitting: Internal Medicine

## 2021-02-09 ENCOUNTER — Non-Acute Institutional Stay (SKILLED_NURSING_FACILITY): Payer: Medicare Other | Admitting: Internal Medicine

## 2021-02-09 DIAGNOSIS — T8459XS Infection and inflammatory reaction due to other internal joint prosthesis, sequela: Secondary | ICD-10-CM

## 2021-02-09 DIAGNOSIS — M069 Rheumatoid arthritis, unspecified: Secondary | ICD-10-CM

## 2021-02-09 DIAGNOSIS — D500 Iron deficiency anemia secondary to blood loss (chronic): Secondary | ICD-10-CM | POA: Diagnosis not present

## 2021-02-09 DIAGNOSIS — E039 Hypothyroidism, unspecified: Secondary | ICD-10-CM

## 2021-02-09 DIAGNOSIS — K2100 Gastro-esophageal reflux disease with esophagitis, without bleeding: Secondary | ICD-10-CM

## 2021-02-09 DIAGNOSIS — Z96649 Presence of unspecified artificial hip joint: Secondary | ICD-10-CM

## 2021-02-09 DIAGNOSIS — I10 Essential (primary) hypertension: Secondary | ICD-10-CM | POA: Diagnosis not present

## 2021-02-09 DIAGNOSIS — R600 Localized edema: Secondary | ICD-10-CM

## 2021-02-09 DIAGNOSIS — R21 Rash and other nonspecific skin eruption: Secondary | ICD-10-CM

## 2021-02-09 DIAGNOSIS — F418 Other specified anxiety disorders: Secondary | ICD-10-CM

## 2021-02-09 DIAGNOSIS — F5105 Insomnia due to other mental disorder: Secondary | ICD-10-CM

## 2021-02-09 DIAGNOSIS — G4733 Obstructive sleep apnea (adult) (pediatric): Secondary | ICD-10-CM

## 2021-02-09 NOTE — Progress Notes (Signed)
Provider:  Einar Crow, MD Location:  [facility] Room Number: [id number] Place of Service:  SNF ([id number])  PCP: [name], MD Patient Care Team: [name], MD as PCP - General (Family Medicine) [name], MD (Inactive) as PCP - Cardiology (Cardiology) [name], MD as PCP - Sleep Medicine (Cardiology) [name], MD (Sports Medicine) [name], MD (Ophthalmology) [name], [name], MD (Dermatology) [name], MD (Gastroenterology) [name], MD (Nephrology) [name], DC as Referring Physician (Chiropractic Medicine)  Extended Emergency Contact Information Primary Emergency ContactSANAH, KRASKA Home Phone: [phone] Relation: Spouse  Code Status: Full Code Goals of Care: Advanced Directive information Advanced Directives 02/09/2021  Does Patient Have a Medical Advance Directive? Yes  Type of Advance Directive Living will  Does patient want to make changes to medical advance directive? No - Patient declined  Copy of Healthcare Power of Attorney in Chart? -      Chief Complaint  Patient presents with   New Admit To SNF    Admission to SNF    HPI: Patient is a 80 y.o. 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  She had a right hip replacement in 2018 followed by recurrent pain and swelling.  Fluid aspiration was positive for Pseudomonas.  She was admitted on 9/6 /22 for removal of hardware with debridement.  Postop patient developed acute renal insufficiency and respiratory failure was intubated.  Her creatinine peaked at 5.5 She  was started on Zosyn on 9/30 She was extubated on 9/18 and is now weaned off to room air. Had a EGD done due to acute blood loss anemia.  It showed mild esophagitis with no active bleeding.  She received 6 units of PRBC Her creatinine eventually improved to 1.3 which is her baseline  Now she is in SNF for Therapy and Finish her IV antibiotics Continues to have pain in her right side.  Using tramadol and Tylenol. Appetite continues to be poor but no nausea or vomiting. Bowels are moving. Her main complaint today was the rash she has macular papular papular rash throughout her body including her upper extremity lower extremity back and front.  It is very itchy.  No fever or chills. Patient also complaining of very dry mouth and sore and difficult for her to speak Working with therapy TD WB on her Right leg   Past Medical History:  Diagnosis Date   Benign hypertension with chronic kidney disease, stage III (HCC)    Overview:  Last Assessment & Plan:  Usually the patient is hypertensive. However today her blood pressure is 105 systolic. She has been feeling fatigued with some lightheadedness. Part of this may be related to her lower blood pressure. Her pressure may be improved with her decrease in salt and fluid intake. I've instructed her to put her lisinopril/hctz on hold. I've asked her to see her primary    Cardiac disease 03/10/2014   Chronic diarrhea 07/27/2015   Edema 07/27/2015   Ejection fraction 2004   Normal, echo,    Gastroesophageal reflux disease    GERD (gastroesophageal reflux disease)    Heart disease  03/10/2014   Hypertension    Left bundle branch block 09/01/2020   Obesity    Obesity (BMI 30-39.9) 09/25/2017   OSA (obstructive sleep apnea)    mild with AHI 6.75 - on CPAP   Preop cardiovascular exam 11/2010   Cardiac clearance for knee surgery    Sleep apnea 03/10/2014   Supraventricular tachycardia (HCC)    Documented episode in the past, possibly reentrant  tachycardia  Overview:  Overview:  Documented episode in the past, 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 It was possibly reentrant. She does well with diltiazem. No change in therapy.   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 She has never used smokeless tobacco. She reports that she does not drink alcohol and does not use drugs. Social History   Socioeconomic History   Marital status: Married    Spouse name: Not on file   Number of children: Not on file   Years of education: Not on file   Highest education level: Not on file  Occupational History   Not on file  Tobacco Use   Smoking status: Former   Smokeless tobacco: Never  Vaping Use   Vaping Use: Never used  Substance and Sexual Activity   Alcohol use: No   Drug use: No   Sexual activity: Not on file  Other Topics Concern   Not on file  Social History Narrative   Not on file   Social Determinants of Health   Financial Resource Strain: Not on file  Food Insecurity: Not on file  Transportation Needs: Not on file  Physical Activity: Not on file  Stress: Not on file  Social Connections: Not on file  Intimate Partner Violence: Not on file    Functional Status Survey:    Family History  Problem Relation Age of Onset   High blood pressure Mother    Diabetes  Mother    Heart Problems Father    Diabetes Father    Prostate cancer Father    Heart attack Father    Heart attack Paternal Grandmother    Heart attack Maternal Grandfather    Heart attack Paternal Uncle    Stroke Neg Hx     Health Maintenance  Topic Date Due   TETANUS/TDAP  Never done   Zoster Vaccines- Shingrix (2 of 2) 03/08/2017   INFLUENZA VACCINE  11/23/2020   DEXA SCAN  Completed   COVID-19 Vaccine  Completed   HPV VACCINES  Aged Out    Allergies  Allergen Reactions   Keflex [Cephalexin] Diarrhea   Macrobid [Nitrofurantoin] Hives    Allergies as of 02/09/2021       Reactions   Keflex [cephalexin] Diarrhea   Macrobid [nitrofurantoin] Hives        Medication List  Accurate as of February 09, 2021 10:42 AM. If you have any questions, ask your nurse or doctor.          Acetaminophen 325 MG Chew Chew 650 mg by mouth every 6 (six) hours as needed for pain.   amLODipine 10 MG tablet Commonly known as: NORVASC Take 10 mg by mouth daily.   bifidobacterium infantis capsule Take 1 capsule by mouth daily.   CALCIUM PLUS VITAMIN D3 PO Take 1 tablet by mouth daily. Vitamin D3 + Calcium 600   ferrous sulfate 325 (65 FE) MG EC tablet Take 325 mg by mouth. Once A Day on Mon, Wed, Fri;   furosemide 20 MG tablet Commonly known as: LASIX Take 60 mg by mouth 2 (two) times daily.   gabapentin 300 MG capsule Commonly known as: NEURONTIN Take 300 mg by mouth at bedtime.   levothyroxine 25 MCG tablet Commonly known as: SYNTHROID Take 25 mcg by mouth daily before breakfast. Take (2)tabs = 50 mcg; oral every other day   levothyroxine 25 MCG tablet Commonly known as: SYNTHROID Take 25 mcg by mouth every morning. Every other day   loperamide 2 MG capsule Commonly known as: IMODIUM Take by mouth.   melatonin 1 MG Tabs tablet Take by mouth.   mirtazapine 15 MG tablet Commonly known as: REMERON Take 15 mg by mouth at bedtime.   nystatin  powder Generic drug: nystatin Apply 1 application topically 2 (two) times daily.   ondansetron 4 MG disintegrating tablet Commonly known as: ZOFRAN-ODT Take by mouth.   traMADol 50 MG tablet Commonly known as: ULTRAM Take 50 mg by mouth every 6 (six) hours as needed.   Vitamin D (Ergocalciferol) 50 MCG (2000 UT) Caps Take by mouth daily.        Review of Systems  Constitutional:  Positive for activity change and appetite change.  HENT: Negative.    Respiratory: Negative.    Cardiovascular: Negative.   Gastrointestinal: Negative.   Genitourinary: Negative.   Musculoskeletal:  Positive for arthralgias, back pain, gait problem and myalgias.  Skin:  Positive for rash.  Neurological:  Negative for dizziness.  Psychiatric/Behavioral: Negative.     Vitals:   02/09/21 1015  BP: 118/78  Pulse: 81  Resp: 18  Temp: (!) 96.7 F (35.9 C)  SpO2: 95%  Weight: 254 lb 6.4 oz (115.4 kg)  Height: 5\' 8"  (1.727 m)   Body mass index is 38.68 kg/m. Physical Exam Vitals reviewed.  Constitutional:      Appearance: She is obese.  HENT:     Head: Normocephalic.     Nose: Nose normal.     Mouth/Throat:     Mouth: Mucous membranes are dry.     Comments: Red tongue but no Thrush Eyes:     Pupils: Pupils are equal, round, and reactive to light.  Cardiovascular:     Pulses: Normal pulses.     Heart sounds: Normal heart sounds.  Pulmonary:     Effort: Pulmonary effort is normal.     Breath sounds: Normal breath sounds.  Abdominal:     General: Abdomen is flat. Bowel sounds are normal.     Palpations: Abdomen is soft.  Musculoskeletal:        General: No swelling.     Cervical back: Neck supple.  Skin:    General: Skin is warm.     Comments: Has Severe Maculopapular Rash in her Whole Body Worse on her Hands and Legs   Neurological:  General: No focal deficit present.     Mental Status: She is alert and oriented to person, place, and time.  Psychiatric:        Mood and  Affect: Mood normal.        Thought Content: Thought content normal.    Labs reviewed: Basic Metabolic Panel: Recent Labs    10/24/20 1308 02/08/21 0000  NA 139 140  K 4.1 3.0*  CL 110 107  CO2 22 24*  GLUCOSE 96  --   BUN 15 11  CREATININE 0.98 1.2*  CALCIUM 9.2 7.5*   Liver Function Tests: Recent Labs    10/24/20 1308 02/08/21 0000  AST 23 36*  ALT 32 24  ALKPHOS 117  --   BILITOT 0.6  --   PROT 7.6  --   ALBUMIN 3.6 2.8*   No results for input(s): LIPASE, AMYLASE in the last 8760 hours. No results for input(s): AMMONIA in the last 8760 hours. CBC: Recent Labs    10/24/20 1308 02/08/21 0000  WBC 9.3 8.2  NEUTROABS 7.0  --   HGB 10.6* 9.9*  HCT 34.8* 31*  MCV 83.7  --   PLT 430*  --    Cardiac Enzymes: No results for input(s): CKTOTAL, CKMB, CKMBINDEX, TROPONINI in the last 8760 hours. BNP: Invalid input(s): POCBNP No results found for: HGBA1C Lab Results  Component Value Date   TSH 4.520 (H) 03/29/2018   No results found for: VITAMINB12 No results found for: FOLATE No results found for: IRON, TIBC, FERRITIN  Imaging and Procedures obtained prior to SNF admission: DG Lumbar Spine 2-3 Views  Result Date: 12/04/2020 CLINICAL DATA:  Low back pain and right leg pain. EXAM: LUMBAR SPINE - 2-3 VIEW COMPARISON:  Lumbar CT myelogram 04/06/2020 FINDINGS: Transitional lumbosacral anatomy wheezing similar numbering to prior lumbar myelogram. Lumbarization of S1. Similar retrolisthesis of L1 on L2, not well assessed by radiograph. Similar anterolisthesis of L3 on L4, L4 on L5 and L5 on S1. chronic compression fractures of L1, L3, and L4, not significantly changed in the interim. Spinous process fusion hardware at L3-L4 and L4-L5. There is diffuse facet hypertrophy. Multilevel degenerative disc disease. No evidence of acute fracture IMPRESSION: 1. Chronic compression fractures of L1, L3, and L4, not significantly changed in the interim. 2. Multilevel degenerative disc  disease and facet arthropathy. There is no significant interval change from prior lumbar myelogram. 3. Spinous process fusion hardware at L3-L4 and L4-L5. 4. 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 Same numbering used as on prior myelogram. Electronically Signed   By: Narda Rutherford M.D.   On: 12/04/2020 19:42   DG Pelvis 1-2 Views  Result Date: 12/04/2020 CLINICAL DATA:  Low back pain and right hip pain EXAM: PELVIS - 1-2 VIEW COMPARISON:  None available. FINDINGS: Right hip arthroplasty. 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 Acuity is uncertain, no prior exams available for comparison. No definite periprosthetic lucency. Mild degenerative change of the sacroiliac joints and pubic symphysis at which remain congruent. No evidence of focal bone lesion or bone destruction. IMPRESSION: 1. Right hip arthroplasty with acetabular protrusio, marked thinning of the medial acetabular cortex. Acuity is uncertain, no prior exams available for comparison. 2. Mild degenerative change of the sacroiliac joints and pubic symphysis. Electronically Signed   By: Narda Rutherford M.D.   On: 12/04/2020 19:45    Assessment/Plan  S/P Surgery for Infection of prosthetic hip joint,  S/p Removal of Hardware Incision is now healed  Patient is TDWB Therapy recommending Power Wheelchair for Mobility Pain Controlled on Tramadol, Tylenol and Neurontin Last day of Zosyn today Has apppointment with ID if ok with them to discontinue [place] today Addendum Per ID Zosyn discontinued They have started her on Cipro and Doxycyline for now  Blood loss anemia S/P EGD HGB stable On Iron Benign essential HTN On Norvasc Maculopapular rash Seems Allergic Rash Will start on Prednisone 20 mg QD for 7 days Also will do Atarax at niught for symotoms releif Today is last day of Zosyn and it seems the reason of her Rash  Rheumatoid arthritis involving multiple sites, unspecified  whether rheumatoid factor present (HCC) Was on Plaquenil nbefore Off it for now Will consider restarting when rash better Gastroesophageal reflux disease with esophagitis without hemorrhage On Omeprazole OSA (obstructive sleep apnea) On CPAP Hypothyroidism, unspecified type TSH 8.33 today Will wait to change the dose Repeat in 6 weeks  Edema of lower extremity On High doise of Lasix Creat as Baseline Insomnia secondary to depression with anxiety On Remeron Mucositis Will try Magic mouth wash Hypokalemia Started on Potassium 20 meq BID 1 week then QD Family/ staff Communication:   Labs/tests ordered:

## 2021-02-15 LAB — POCT ERYTHROCYTE SEDIMENTATION RATE, NON-AUTOMATED: Sed Rate: 29

## 2021-02-15 LAB — BASIC METABOLIC PANEL
BUN: 31 — AB (ref 4–21)
CO2: 25 — AB (ref 13–22)
Chloride: 102 (ref 99–108)
Creatinine: 1.2 — AB (ref 0.5–1.1)
Glucose: 87
Potassium: 3.9 (ref 3.4–5.3)
Sodium: 137 (ref 137–147)

## 2021-02-15 LAB — HEPATIC FUNCTION PANEL
ALT: 253 — AB (ref 7–35)
AST: 271 — AB (ref 13–35)
Alkaline Phosphatase: 88 (ref 25–125)
Bilirubin, Total: 0.5

## 2021-02-15 LAB — CBC AND DIFFERENTIAL
HCT: 34 — AB (ref 36–46)
Hemoglobin: 10.7 — AB (ref 12.0–16.0)
Neutrophils Absolute: 5741
Platelets: 413 — AB (ref 150–399)
WBC: 10.4

## 2021-02-15 LAB — COMPREHENSIVE METABOLIC PANEL
Albumin: 3.1 — AB (ref 3.5–5.0)
Calcium: 8.5 — AB (ref 8.7–10.7)
Globulin: 3

## 2021-02-15 LAB — CBC: RBC: 4.04 (ref 3.87–5.11)

## 2021-02-16 ENCOUNTER — Non-Acute Institutional Stay (SKILLED_NURSING_FACILITY): Payer: Medicare Other | Admitting: Nurse Practitioner

## 2021-02-16 ENCOUNTER — Encounter: Payer: Self-pay | Admitting: Nurse Practitioner

## 2021-02-16 DIAGNOSIS — M25551 Pain in right hip: Secondary | ICD-10-CM

## 2021-02-16 DIAGNOSIS — R748 Abnormal levels of other serum enzymes: Secondary | ICD-10-CM | POA: Diagnosis not present

## 2021-02-16 DIAGNOSIS — I10 Essential (primary) hypertension: Secondary | ICD-10-CM

## 2021-02-16 DIAGNOSIS — R21 Rash and other nonspecific skin eruption: Secondary | ICD-10-CM

## 2021-02-16 DIAGNOSIS — N1831 Chronic kidney disease, stage 3a: Secondary | ICD-10-CM

## 2021-02-16 DIAGNOSIS — F418 Other specified anxiety disorders: Secondary | ICD-10-CM

## 2021-02-16 DIAGNOSIS — T8451XD Infection and inflammatory reaction due to internal right hip prosthesis, subsequent encounter: Secondary | ICD-10-CM | POA: Diagnosis not present

## 2021-02-16 DIAGNOSIS — D500 Iron deficiency anemia secondary to blood loss (chronic): Secondary | ICD-10-CM

## 2021-02-16 DIAGNOSIS — T8451XA Infection and inflammatory reaction due to internal right hip prosthesis, initial encounter: Secondary | ICD-10-CM | POA: Insufficient documentation

## 2021-02-16 DIAGNOSIS — E039 Hypothyroidism, unspecified: Secondary | ICD-10-CM

## 2021-02-16 DIAGNOSIS — F5105 Insomnia due to other mental disorder: Secondary | ICD-10-CM

## 2021-02-16 DIAGNOSIS — K2100 Gastro-esophageal reflux disease with esophagitis, without bleeding: Secondary | ICD-10-CM

## 2021-02-16 NOTE — Assessment & Plan Note (Signed)
Blood pressure is controlled, continue  Amlodipine, Bun/creat 31/1.21 eGFR 45 02/15/21

## 2021-02-16 NOTE — Assessment & Plan Note (Signed)
10/24/22Na 137, K 3.9, Bun 31, creat 1.21, AST 271, ALT 253, ESR 29, CRP 1.2, wbc 10.4, Hgb 10.7, plt 413, neutrophils 55.2

## 2021-02-16 NOTE — Assessment & Plan Note (Signed)
Improving.

## 2021-02-16 NOTE — Assessment & Plan Note (Signed)
post op, s/p EGD no active bleed. S/p 6 u PRBC transfusion, ASA was dc'd, Hgb 10.7 02/15/21

## 2021-02-16 NOTE — Progress Notes (Addendum)
Location:   [facility] [place] Room Number: 63 Place of Service:  SNF (31) Provider: Lennie Odor Deserea Bordley NP  Cari Caraway, MD  Patient Care Team: Cari Caraway, MD as PCP - General (Family Medicine) Dorothy Spark, MD (Inactive) as PCP - Cardiology (Cardiology) Sueanne Margarita, MD as PCP - Sleep Medicine (Cardiology) Donzetta Sprung., MD (Sports Medicine) Linward Natal, MD (Ophthalmology) [name], Katharine Look, MD (Dermatology) Ronald Lobo, MD (Gastroenterology) Thereasa Distance, MD (Nephrology) Jola Baptist, DC as Referring Physician (Chiropractic Medicine)  Extended Emergency Contact Information Primary Emergency ContactPUANANI, GENE Home Phone: [phone] Relation: Spouse  Code Status: DNR Goals of care: Advanced Directive information Advanced Directives 02/09/2021  Does Patient Have a Medical Advance Directive? Yes  Type of Advance Directive Living will  Does patient want to make changes to medical advance directive? No - Patient declined  Copy of [place] in Chart? -     Chief Complaint  Patient presents with  . Acute Visit    Elevated liver enzymes.      HPI:  Pt is a 80 y.o. female seen today for an acute visit for elevated AST 271, ALT 253, normal alk phos, bilirubin. S/p cholecystectomy. Admitted feeling of indigestion. Denied nausea, vomiting, abd pain. She is afebrile     R hip pain, s/p right THR 2018, removal of hardware 12/29/20,  takes Tylenol, Gabapentin, Tramadol. F/u Ortho 6 weeks. TDWB. 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   Rash, improving, s/p 7 day course Prednisone, Atarax              Anemia, post op, s/p EGD no active bleed. S/p 6 u PRBC transfusion, ASA was dc'd, Hgb 10.7 02/15/21. On Fe             HTN takes Amlodipine, Bun/creat 31/1.21 eGFR 45 02/15/21             Morbid obesity              OSA CPAP             Chronic diarrhea, prn Imodium, Align, Welchol             RA may resume Plaquenil after rash is healed.              Insomnia/depression/anxiety, takes Mirtazapine             GERD, off PPI, prn Zofran is available to her.              Edema, BLE, takes Furosemide 6086mid CKD Bun/creat 31/1.21 eGFR 45 02/15/21             Hypothyroidism, takes Levothyroxine 32m77md, TSH 8.33 02/09/21, repeat 6 wks.                       Past Medical History:    Past Medical History:  Diagnosis Date  . Benign hypertension with chronic kidney disease, stage III (HCC)    Overview:  Last Assessment & Plan:  Usually the patient is hypertensive. However today her blood pressure is 105 659tolic. She has been feeling fatigued with some lightheadedness. Part of this may be related to her lower blood pressure. Her pressure may be improved with her decrease in salt and fluid intake. I've  instructed her to put her lisinopril/hctz on hold. I've asked her to see her primary   . Cardiac disease 03/10/2014  . Chronic diarrhea 07/27/2015  . Edema 07/27/2015  . Ejection fraction 2004   Normal, echo,   . Gastroesophageal reflux disease   . GERD (gastroesophageal reflux disease)   . Heart disease 03/10/2014  . Hypertension   . Left bundle branch block 09/01/2020  . Obesity   . Obesity (BMI 30-39.9) 09/25/2017  . OSA (obstructive sleep apnea)    mild with AHI 6.75 - on CPAP  . Preop cardiovascular exam 11/2010   Cardiac clearance for knee surgery   . Sleep apnea 03/10/2014  . Supraventricular tachycardia ([place])    Documented episode in the past, possibly reentrant tachycardia  Overview:  Overview:  Documented episode in the past, 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 It was possibly reentrant. She does well with diltiazem. No change in therapy.  . SVT (supraventricular tachycardia)  ([place])    Documented episode in the past, possibly reentrant tachycardia  . Thyroid disease   . Urinary incontinence    Past Surgical History:  Procedure Laterality Date  . CATARACT EXTRACTION Left 8/11  . CATARACT EXTRACTION Right 3/12  . CHOLECYSTECTOMY  1994  . Copsilotomy Laser Treatment Left 6/12   eye  . ELBOW SURGERY  8/12  . 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 EYE SURGERY Right 12/11  . Lumbar Infusion  2011  . MOLE REMOVAL  06/17/2015  . 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 TOTAL KNEE ARTHROPLASTY Right 06/13/2011    Allergies  Allergen Reactions  . Keflex [Cephalexin] Diarrhea  . Macrobid [Nitrofurantoin] Hives    Allergies as of 02/16/2021       Reactions   Keflex [cephalexin] Diarrhea   Macrobid [nitrofurantoin] Hives        Medication List        Accurate as of February 16, 2021 11:59 PM. If you have any questions, ask your nurse or doctor.          Acetaminophen 325 MG Chew Chew 650 mg by mouth every 6 (six) hours as needed for pain.   amLODipine 10 MG tablet Commonly known as: NORVASC Take 10 mg by mouth daily.   bifidobacterium infantis capsule Take 1 capsule by mouth daily.   CALCIUM PLUS VITAMIN D3 PO Take 1 tablet by mouth daily. Vitamin D3 81mg + Calcium 600   ferrous sulfate 325 (65 FE) MG EC tablet Take 325 mg by mouth. Once A Day on Mon, Wed, Fri;   furosemide 20 MG tablet Commonly known as: LASIX Take 60 mg by mouth 2 (two) times daily.   gabapentin 300 MG capsule Commonly known as: NEURONTIN Take 300 mg by mouth at bedtime.   levothyroxine 25 MCG tablet Commonly known as: SYNTHROID Take 25 mcg by mouth daily before breakfast. Take (2)tabs = 50 mcg; oral every other day   levothyroxine 25 MCG tablet Commonly known as: SYNTHROID Take 25 mcg by mouth every morning. Every other day   loperamide 2 MG capsule Commonly known as: IMODIUM Take by mouth.   melatonin 1 MG Tabs tablet Take by mouth.   mirtazapine  15 MG tablet Commonly known as: REMERON Take 15 mg by mouth at bedtime.   nystatin powder Generic drug: nystatin Apply 1 application topically 2 (two) times daily.   ondansetron 4 MG  disintegrating tablet Commonly known as: ZOFRAN-ODT Take by mouth.   potassium chloride SA 20 MEQ tablet Commonly known as: KLOR-CON Take 20 mEq by mouth daily. Take BID for 1 week and then QD   traMADol 50 MG tablet Commonly known as: ULTRAM Take 50 mg by mouth every 6 (six) hours as needed.   Vitamin D (Ergocalciferol) 50 MCG (2000 UT) Caps Take by mouth daily.        Review of Systems  Constitutional:  Negative for appetite change, fatigue and fever.  HENT:  Negative for trouble swallowing.   Eyes:  Negative for visual disturbance.  Respiratory:  Negative for cough and shortness of breath.   Gastrointestinal:  Negative for abdominal pain, constipation, nausea and vomiting.       Indigestion.   Genitourinary:  Negative for dysuria.  Musculoskeletal:  Positive for arthralgias, gait problem and joint swelling.  Skin:  Positive for rash.  Neurological:  Negative for speech difficulty, weakness and headaches.  Psychiatric/Behavioral:  Negative for confusion and sleep disturbance. The patient is not nervous/anxious.    Immunization History  Administered Date(s) Administered  . Influenza, High Dose Seasonal PF 01/11/2017, 01/30/2018  . Moderna Sars-Covid-2 Vaccination 05/09/2019, 05/27/2019, 03/03/2020, 11/04/2020  . Pension scheme manager 80yr & up 02/09/2021  . Zoster Recombinat (Shingrix) 01/11/2017   Pertinent  Health Maintenance Due  Topic Date Due  . INFLUENZA VACCINE  11/23/2020  . DEXA SCAN  Completed   Fall Risk 10/24/2020  Patient Fall Risk Level Moderate fall risk   Functional Status Survey:    Vitals:   02/16/21 1627  BP: 130/88  Pulse: 88  Resp: 18  Temp: (!) 97.5 F (36.4 C)  SpO2: 94%   There is no height or weight on file to calculate  BMI. Physical Exam Vitals and nursing note reviewed.  Constitutional:      Appearance: She is obese.  HENT:     Head: Normocephalic and atraumatic.     Nose: Nose normal.     Mouth/Throat:     Mouth: Mucous membranes are moist.  Eyes:     Extraocular Movements: Extraocular movements intact.     Conjunctiva/sclera: Conjunctivae normal.     Pupils: Pupils are equal, round, and reactive to light.  Cardiovascular:     Rate and Rhythm: Normal rate and regular rhythm.     Heart sounds: No murmur heard. Pulmonary:     Effort: Pulmonary effort is normal.  Abdominal:     General: Bowel sounds are normal.     Palpations: Abdomen is soft.     Tenderness: There is no abdominal tenderness. There is no right CVA tenderness, left CVA tenderness, guarding or rebound.  Musculoskeletal:        General: Swelling and tenderness present.     Cervical back: Normal range of motion and neck supple.     Right lower leg: Edema present.     Left lower leg: Edema present.     Comments: R hip. Dependent edema in hips/thighs.   Skin:    General: Skin is warm and dry.     Findings: Rash present.     Comments: Right hip surgical scar. Diffused redness under arms, thighs, maculopapular rash on back, healing.   Neurological:     General: No focal deficit present.     Mental Status: She is alert.  Psychiatric:        Mood and Affect: Mood normal.        Behavior: Behavior  normal.        Thought Content: Thought content normal.        Judgment: Judgment normal.    Labs reviewed: Recent Labs    10/24/20 1308 02/08/21 0000  NA 139 140  K 4.1 3.0*  CL 110 107  CO2 22 24*  GLUCOSE 96  --   BUN 15 11  CREATININE 0.98 1.2*  CALCIUM 9.2 7.5*   Recent Labs    10/24/20 1308 02/08/21 0000  AST 23 36*  ALT 32 24  ALKPHOS 117  --   BILITOT 0.6  --   PROT 7.6  --   ALBUMIN 3.6 2.8*   Recent Labs    10/24/20 1308 02/08/21 0000  WBC 9.3 8.2  NEUTROABS 7.0  --   HGB 10.6* 9.9*  HCT 34.8* 31*   MCV 83.7  --   PLT 430*  --    Lab Results  Component Value Date   TSH 4.520 (H) 03/29/2018   No results found for: HGBA1C No results found for: CHOL, HDL, LDLCALC, LDLDIRECT, TRIG, CHOLHDL  Significant Diagnostic Results in last 30 days:  No results found.  Assessment/Plan: Elevated liver enzymes 10/24/22Na 137, K 3.9, Bun 31, creat 1.21, AST 271, ALT 253, ESR 29, CRP 1.2, wbc 10.4, Hgb 10.7, plt 413, neutrophils 55.2 ? AR of Cipro 02/17/21 Amylase 19(21-101), Lipase 22(7-60),  Na 135, K 4.6, Bun 41, creat 1.36 eGFR 39, AST 205, ALT 275, pening US liver/pancrease/RUQ abd  02/18/21 CMP one week.   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 F/u Ortho 6 weeks.  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   Rash Improving.   Blood loss anemia post op, s/p EGD no active bleed. S/p 6 u PRBC transfusion, ASA was dc'd, Hgb 10.7 02/15/21  Benign essential HTN Blood pressure is controlled, continue  Amlodipine, Bun/creat 31/1.21 eGFR 45 02/15/21  Insomnia secondary to depression with anxiety Stable, akes Mirtazapine  Gastroesophageal reflux disease Worsened feeling of indigestion, last BM today, off PPI, will try Omeprazole 207md, continue  prn Zofran.   CKD (chronic kidney disease) stage 3, GFR 30-59 ml/min (HCC) Edema, BLE, takes Furosemide 87m80md. CKD Bun/creat 31/1.21 eGFR 45 02/15/21  Hypothyroidism  takes Levothyroxine 25mc18m, TSH 8.33 02/09/21, repeat 6 wks.     Family/ staff Communication: plan of care reviewed with the patient and charge nurse.   Labs/tests ordered:  CMP/eGFR, Amylase, Lipase, US liKorear, pancrease, RUQ

## 2021-02-16 NOTE — Assessment & Plan Note (Addendum)
Worsened feeling of indigestion, last BM today, off PPI, will try Omeprazole 20mg  qd, continue  prn Zofran.

## 2021-02-16 NOTE — Assessment & Plan Note (Addendum)
10/24/22Na 137, K 3.9, Bun 31, creat 1.21, AST 271, ALT 253, ESR 29, CRP 1.2, wbc 10.4, Hgb 10.7, plt 413, neutrophils 55.2 ? AR of Cipro 02/17/21 Amylase 19(21-101), Lipase 22(7-60),  Na 135, K 4.6, Bun 41, creat 1.36 eGFR 39, AST 205, ALT 275, pening US liver/pancrease/RUQ abd  02/18/21 CMP one week.

## 2021-02-16 NOTE — Assessment & Plan Note (Signed)
R hip pain, s/p right THR 2018, removal of hardware 12/29/20,  takes Tylenol, Gabapentin, Tramadol. F/u Ortho 6 weeks.  

## 2021-02-16 NOTE — Assessment & Plan Note (Signed)
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

## 2021-02-16 NOTE — Assessment & Plan Note (Signed)
takes Levothyroxine 25mcg qd, TSH 8.33 02/09/21, repeat 6 wks.  

## 2021-02-16 NOTE — Assessment & Plan Note (Signed)
Edema, BLE, takes Furosemide 46m bid. CKD Bun/creat 31/1.21 eGFR 45 02/15/21

## 2021-02-16 NOTE — Assessment & Plan Note (Signed)
Stable, akes Mirtazapine

## 2021-02-17 LAB — BASIC METABOLIC PANEL
BUN: 41 — AB (ref 4–21)
CO2: 26 — AB (ref 13–22)
Chloride: 101 (ref 99–108)
Creatinine: 1.4 — AB (ref 0.5–1.1)
Glucose: 87
Potassium: 4.6 (ref 3.4–5.3)
Sodium: 135 — AB (ref 137–147)

## 2021-02-17 LAB — HEPATIC FUNCTION PANEL
ALT: 275 — AB (ref 7–35)
AST: 205 — AB (ref 13–35)
Alkaline Phosphatase: 95 (ref 25–125)
Bilirubin, Total: 0.6

## 2021-02-17 LAB — COMPREHENSIVE METABOLIC PANEL
Albumin: 3.2 — AB (ref 3.5–5.0)
Calcium: 9.1 (ref 8.7–10.7)
Globulin: 3.3

## 2021-02-18 LAB — BASIC METABOLIC PANEL
BUN: 41 — AB (ref 4–21)
CO2: 26 — AB (ref 13–22)
Chloride: 101 (ref 99–108)
Creatinine: 1.4 — AB (ref 0.5–1.1)
Glucose: 87
Potassium: 4.6 (ref 3.4–5.3)
Sodium: 135 — AB (ref 137–147)

## 2021-02-18 LAB — COMPREHENSIVE METABOLIC PANEL: Calcium: 9.1 (ref 8.7–10.7)

## 2021-02-19 ENCOUNTER — Encounter: Payer: Self-pay | Admitting: Nurse Practitioner

## 2021-02-19 ENCOUNTER — Ambulatory Visit: Payer: Medicare Other | Admitting: Physician Assistant

## 2021-02-19 ENCOUNTER — Non-Acute Institutional Stay (SKILLED_NURSING_FACILITY): Payer: Medicare Other | Admitting: Nurse Practitioner

## 2021-02-19 DIAGNOSIS — L89202 Pressure ulcer of unspecified hip, stage 2: Secondary | ICD-10-CM | POA: Diagnosis not present

## 2021-02-19 DIAGNOSIS — R748 Abnormal levels of other serum enzymes: Secondary | ICD-10-CM

## 2021-02-19 DIAGNOSIS — F418 Other specified anxiety disorders: Secondary | ICD-10-CM

## 2021-02-19 DIAGNOSIS — R600 Localized edema: Secondary | ICD-10-CM

## 2021-02-19 DIAGNOSIS — M069 Rheumatoid arthritis, unspecified: Secondary | ICD-10-CM

## 2021-02-19 DIAGNOSIS — F5105 Insomnia due to other mental disorder: Secondary | ICD-10-CM

## 2021-02-19 DIAGNOSIS — M25551 Pain in right hip: Secondary | ICD-10-CM

## 2021-02-19 DIAGNOSIS — K2100 Gastro-esophageal reflux disease with esophagitis, without bleeding: Secondary | ICD-10-CM

## 2021-02-19 DIAGNOSIS — T8451XD Infection and inflammatory reaction due to internal right hip prosthesis, subsequent encounter: Secondary | ICD-10-CM

## 2021-02-19 DIAGNOSIS — D500 Iron deficiency anemia secondary to blood loss (chronic): Secondary | ICD-10-CM

## 2021-02-19 DIAGNOSIS — N1831 Chronic kidney disease, stage 3a: Secondary | ICD-10-CM

## 2021-02-19 DIAGNOSIS — I10 Essential (primary) hypertension: Secondary | ICD-10-CM | POA: Diagnosis not present

## 2021-02-19 DIAGNOSIS — E039 Hypothyroidism, unspecified: Secondary | ICD-10-CM

## 2021-02-19 LAB — C-REACTIVE PROTEIN: CRP: 8.1

## 2021-02-19 LAB — AMYLASE
Amylase: 19
Lipase: 22

## 2021-02-19 NOTE — Assessment & Plan Note (Signed)
takes Levothyroxine qd, TSH 8.33 02/09/21, repeat 6 wks.

## 2021-02-19 NOTE — Assessment & Plan Note (Signed)
not apparent, takes Furosemide 60mg  bid

## 2021-02-19 NOTE — Progress Notes (Addendum)
Location:   [facility] [place] Room Number: 65 Place of Service:  SNF (31) Provider: Lennie Odor Deitrick Ferreri NP  Cari Caraway, MD  Patient Care Team: Cari Caraway, MD as PCP - General (Family Medicine) Dorothy Spark, MD (Inactive) as PCP - Cardiology (Cardiology) Sueanne Margarita, MD as PCP - Sleep Medicine (Cardiology) Donzetta Sprung., MD (Sports Medicine) Linward Natal, MD (Ophthalmology) [name], Katharine Look, MD (Dermatology) Ronald Lobo, MD (Gastroenterology) Thereasa Distance, MD (Nephrology) Jola Baptist, DC as Referring Physician (Chiropractic Medicine)  Extended Emergency Contact Information Primary Emergency ContactRYEN, HEITMEYER Home Phone: [phone] Relation: Spouse  Code Status: DNR Goals of care: Advanced Directive information Advanced Directives 02/19/2021  Does Patient Have a Medical Advance Directive? Yes  Type of Advance Directive Living will  Does patient want to make changes to medical advance directive? No - Patient declined  Copy of [place] in Chart? -     Chief Complaint  Patient presents with  . Acute Visit    skin breakdown buttocks    HPI:  Pt is a 80 y.o. female seen today for an acute visit for skin breakdown [name] just below the gluteal fold [name], pressure and friction mostly likely the culprit of the small areas of skin missing.      Elevated AST 271>>205 02/17/21,  ALT 253>>275 02/17/21 since Cipro started, informed ID,  normal alk phos, bilirubin, Amylase, Lipase. S/p cholecystectomy. Pending US liver pancrease RUQ.               R hip pain, s/p right THR 2018, removal of hardware 12/29/20,  takes Tylenol, Gabapentin, Tramadol. F/u Ortho 6 weeks. TDWB. 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              Anemia, post op, s/p EGD no active bleed. S/p 6 u PRBC transfusion, ASA was dc'd, Hgb  10.7 02/15/21. On Fe             HTN takes Amlodipine, Bun/creat 41/1.4 02/17/21             Morbid obesity             OSA CPAP             Chronic diarrhea, prn Imodium, Align, Welchol             RA may resume Plaquenil after rash is healed.              Insomnia/depression/anxiety, takes Mirtazapine             GERD, stable, on Omeprazole, prn Zofran.              Edema, not apparent, takes Furosemide 6038mid CKD Bun/creat 41/1.4 02/17/21             Hypothyroidism, takes Levothyroxine 79m16md, TSH 8.33 02/09/21, repeat 6 wks.                 Past Medical History:  Diagnosis Date  . Benign hypertension with chronic kidney disease, stage III (HCC)    Overview:  Last Assessment & Plan:  Usually the patient is hypertensive. However today her blood pressure is 105 314tolic. She has been feeling fatigued with some lightheadedness. Part of this may be related to her lower blood pressure. Her pressure  may be improved with her decrease in salt and fluid intake. I've instructed her to put her lisinopril/hctz on hold. I've asked her to see her primary   . Cardiac disease 03/10/2014  . Chronic diarrhea 07/27/2015  . Edema 07/27/2015  . Ejection fraction 2004   Normal, echo,   . Gastroesophageal reflux disease   . GERD (gastroesophageal reflux disease)   . Heart disease 03/10/2014  . Hypertension   . Left bundle branch block 09/01/2020  . Obesity   . Obesity (BMI 30-39.9) 09/25/2017  . OSA (obstructive sleep apnea)    mild with AHI 6.75 - on CPAP  . Preop cardiovascular exam 11/2010   Cardiac clearance for knee surgery   . Sleep apnea 03/10/2014  . Supraventricular tachycardia ([place])    Documented episode in the past, possibly reentrant tachycardia  Overview:  Overview:  Documented episode in the past, 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 It was possibly reentrant. She does well with  diltiazem. No change in therapy.  . SVT (supraventricular tachycardia) ([place])    Documented episode in the past, possibly reentrant tachycardia  . Thyroid disease   . Urinary incontinence    Past Surgical History:  Procedure Laterality Date  . CATARACT EXTRACTION Left 8/11  . CATARACT EXTRACTION Right 3/12  . CHOLECYSTECTOMY  1994  . Copsilotomy Laser Treatment Left 6/12   eye  . ELBOW SURGERY  8/12  . 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 EYE SURGERY Right 12/11  . Lumbar Infusion  2011  . MOLE REMOVAL  06/17/2015  . 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 TOTAL KNEE ARTHROPLASTY Right 06/13/2011    Allergies  Allergen Reactions  . Keflex [Cephalexin] Diarrhea  . Macrobid [Nitrofurantoin] Hives    Allergies as of 02/19/2021       Reactions   Keflex [cephalexin] Diarrhea   Macrobid [nitrofurantoin] Hives        Medication List        Accurate as of February 19, 2021 11:59 PM. If you have any questions, ask your nurse or doctor.          STOP taking these medications    melatonin 1 MG Tabs tablet Stopped by: Haileyann Staiger X Zoya Sprecher, NP       TAKE these medications    Acetaminophen 325 MG Chew Chew 650 mg by mouth every 6 (six) hours as needed for pain.   amLODipine 10 MG tablet Commonly known as: NORVASC Take 10 mg by mouth daily.   bifidobacterium infantis capsule Take 1 capsule by mouth daily.   CALCIUM PLUS VITAMIN D3 PO Take 1 tablet by mouth daily. Vitamin D3 27mg + Calcium 600   ciprofloxacin 500 MG tablet Commonly known as: CIPRO Take 500 mg by mouth every 12 (twelve) hours.   doxycycline 100 MG tablet Commonly known as: VIBRA-TABS Take 100 mg by mouth every 12 (twelve) hours.   ferrous sulfate 325 (65 FE) MG EC tablet Take 325 mg by mouth. Once A Day on Mon, Wed, Fri;   furosemide 20 MG tablet Commonly known as: LASIX Take 60 mg by mouth 2 (two) times daily.   gabapentin 300 MG capsule Commonly known as: NEURONTIN Take 300 mg by mouth  at bedtime.   levothyroxine 25 MCG tablet Commonly known as: SYNTHROID Take 25 mcg by mouth daily before breakfast. Take (2)tabs = 50 mcg; oral every other day  levothyroxine 25 MCG tablet Commonly known as: SYNTHROID Take 25 mcg by mouth every morning. Every other day   loperamide 2 MG capsule Commonly known as: IMODIUM Take 2 mg by mouth every 4 (four) hours as needed.   mirtazapine 15 MG tablet Commonly known as: REMERON Take 15 mg by mouth at bedtime.   nystatin powder Commonly known as: MYCOSTATIN/NYSTOP Apply 1 application topically 2 (two) times daily.   omeprazole 20 MG capsule Commonly known as: PRILOSEC Take 20 mg by mouth daily.   ondansetron 4 MG disintegrating tablet Commonly known as: ZOFRAN-ODT Take by mouth.   potassium chloride SA 20 MEQ tablet Commonly known as: KLOR-CON Take 20 mEq by mouth daily. Take BID for 1 week and then QD   traMADol 50 MG tablet Commonly known as: ULTRAM Take 50 mg by mouth every 6 (six) hours as needed.   Vitamin D (Ergocalciferol) 50 MCG (2000 UT) Caps Take by mouth daily.        Review of Systems  Constitutional:  Negative for appetite change, fatigue and fever.  HENT:  Negative for trouble swallowing.   Eyes:  Negative for visual disturbance.  Respiratory:  Negative for cough and shortness of breath.   Cardiovascular:  Negative for leg swelling.  Gastrointestinal:  Negative for abdominal pain and constipation.  Genitourinary:  Negative for dysuria.  Musculoskeletal:  Positive for arthralgias and gait problem. Negative for joint swelling.  Skin:        Posterior [name] thigh skin breakdown.   Neurological:  Negative for speech difficulty, weakness and headaches.  Psychiatric/Behavioral:  Negative for confusion and sleep disturbance. The patient is not nervous/anxious.    Immunization History  Administered Date(s) Administered  . Influenza, High Dose Seasonal PF 01/11/2017, 01/30/2018, 02/05/2020  . Moderna  Sars-Covid-2 Vaccination 05/09/2019, 05/27/2019, 03/03/2020, 11/04/2020  . Pension scheme manager 64yr & up 02/09/2021  . Zoster Recombinat (Shingrix) 11/08/2016, 01/11/2017   Pertinent  Health Maintenance Due  Topic Date Due  . INFLUENZA VACCINE  11/23/2020  . DEXA SCAN  Completed   Fall Risk 10/24/2020  Patient Fall Risk Level Moderate fall risk   Functional Status Survey:    Vitals:   02/19/21 1350  BP: 109/69  Pulse: 80  Resp: 16  Temp: 97.6 F (36.4 C)  SpO2: 98%  Weight: 254 lb 6.4 oz (115.4 kg)  Height: '5\' 8"'  (1.727 m)   Body mass index is 38.68 kg/m. Physical Exam Vitals and nursing note reviewed.  Constitutional:      Appearance: She is obese.  HENT:     Head: Normocephalic and atraumatic.     Nose: Nose normal.     Mouth/Throat:     Mouth: Mucous membranes are moist.  Eyes:     Extraocular Movements: Extraocular movements intact.     Conjunctiva/sclera: Conjunctivae normal.     Pupils: Pupils are equal, round, and reactive to light.  Cardiovascular:     Rate and Rhythm: Normal rate and regular rhythm.     Heart sounds: No murmur heard. Pulmonary:     Effort: Pulmonary effort is normal.  Abdominal:     General: Bowel sounds are normal.     Palpations: Abdomen is soft.     Tenderness: There is no abdominal tenderness. There is no right CVA tenderness, left CVA tenderness, guarding or rebound.  Musculoskeletal:        General: No swelling or tenderness.     Cervical back: Normal range of motion and neck supple.  Right lower leg: No edema.     Left lower leg: No edema.     Comments: R hip.   Skin:    General: Skin is warm and dry.     Comments: Right hip surgical scar. Posterior [name] upper thighs, just below the gluteal folds, a dime sized skin missing open wound [name], no s/s of infection.   Neurological:     General: No focal deficit present.     Mental Status: She is alert.  Psychiatric:        Mood and Affect: Mood normal.         Behavior: Behavior normal.        Thought Content: Thought content normal.        Judgment: Judgment normal.    Labs reviewed: Recent Labs    10/24/20 1308 02/08/21 0000 02/15/21 0000 02/17/21 0000  NA 139 140 137 135*  K 4.1 3.0* 3.9 4.6  CL 110 107 102 101  CO2 22 24* 25* 26*  GLUCOSE 96  --   --   --   BUN 15 11 31* 41*  CREATININE 0.98 1.2* 1.2* 1.4*  CALCIUM 9.2 7.5* 8.5* 9.1   Recent Labs    10/24/20 1308 02/08/21 0000 02/15/21 0000 02/17/21 0000  AST 23 36* 271* 205*  ALT 32 24 253* 275*  ALKPHOS 117  --  88 95  BILITOT 0.6  --   --   --   PROT 7.6  --   --   --   ALBUMIN 3.6 2.8* 3.1* 3.2*   Recent Labs    10/24/20 1308 02/08/21 0000 02/15/21 0000  WBC 9.3 8.2 10.4  NEUTROABS 7.0  --  5,741.00  HGB 10.6* 9.9* 10.7*  HCT 34.8* 31* 34*  MCV 83.7  --   --   PLT 430*  --  413*   Lab Results  Component Value Date   TSH 4.520 (H) 03/29/2018   No results found for: HGBA1C No results found for: CHOL, HDL, LDLCALC, LDLDIRECT, TRIG, CHOLHDL  Significant Diagnostic Results in last 30 days:  No results found.  Assessment/Plan: Pressure ulcer of thigh, stage 2 (HCC) [name] posterior thighs, skin breakdown just below the gluteal folds, pressure and friction shearing mostly likely the culprit of the small areas of skin missing. Will apply hydrocolloid dressing q 3-5 days, avoid pressure and shearing. Observe.   Elevated liver enzymes Elevated AST 271>>205 02/17/21,  ALT 253>>275 02/17/21 since Cipro started, informed ID,  normal alk phos, bilirubin, Amylase, Lipase. S/p cholecystectomy. Pending US liver pancrease RUQ. Pending CMP one week 02/19/21 [place] normal sized liver with no evidence of cyst or masses, s/s cholecystectomy, no evidence of abd aortic aneurysm.   Rheumatoid arthritis ([place]) May resume Plaquenil.  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 F/u Ortho 6 weeks. TDWB. 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   Blood loss anemia post op, s/p EGD no active bleed. S/p 6 u PRBC transfusion, ASA was dc'd, Hgb 10.7 02/15/21. On Fe  Benign essential HTN Blood pressure is controlled, takes Amlodipine, Bun/creat 41/1.4 02/17/21  Insomnia secondary to depression with anxiety Stable,  takes Mirtazapine  Gastroesophageal reflux disease stable, on Omeprazole, prn Zofran.  Edema of lower extremity not apparent, takes Furosemide 6029mid  CKD (chronic kidney disease) stage 3, GFR 30-59 ml/min (HCC) Bun/creat 41/1.4 02/17/21  Hypothyroidism takes Levothyroxine 84mg qd, TSH 8.33 02/09/21, repeat 6 wks.     Family/ staff Communication: plan of care reviewed with the patient and charge nurse.   Labs/tests ordered:  pending f/u CMP/eGFR  Time spend 35 minutes.

## 2021-02-19 NOTE — Assessment & Plan Note (Signed)
Bun/creat 41/1.4 02/17/21

## 2021-02-19 NOTE — Assessment & Plan Note (Signed)
[name] posterior thighs, skin breakdown just below the gluteal folds, pressure and friction shearing mostly likely the culprit of the small areas of skin missing. Will apply hydrocolloid dressing q 3-5 days, avoid pressure and shearing. Observe.

## 2021-02-19 NOTE — Assessment & Plan Note (Signed)
R hip pain, s/p right THR 2018, removal of hardware 12/29/20,  takes Tylenol, Gabapentin, Tramadol. F/u Ortho 6 weeks. TDWB. Power wc

## 2021-02-19 NOTE — Assessment & Plan Note (Signed)
stable, on Omeprazole, prn Zofran.  

## 2021-02-19 NOTE — Assessment & Plan Note (Signed)
May resume Plaquenil.

## 2021-02-19 NOTE — Assessment & Plan Note (Signed)
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

## 2021-02-19 NOTE — Assessment & Plan Note (Signed)
post op, s/p EGD no active bleed. S/p 6 u PRBC transfusion, ASA was dc'd, Hgb 10.7 02/15/21. On Fe

## 2021-02-19 NOTE — Assessment & Plan Note (Addendum)
Stable,  takes Mirtazapine 

## 2021-02-19 NOTE — Assessment & Plan Note (Signed)
Blood pressure is controlled, takes Amlodipine, Bun/creat 41/1.4 02/17/21

## 2021-02-19 NOTE — Assessment & Plan Note (Addendum)
Elevated AST 271>>205 02/17/21,  ALT 253>>275 02/17/21 since Cipro started, informed ID,  normal alk phos, bilirubin, Amylase, Lipase. S/p cholecystectomy. Pending US liver pancrease RUQ. Pending CMP one week 02/19/21 [place] normal sized liver with no evidence of cyst or masses, s/s cholecystectomy, no evidence of abd aortic aneurysm.

## 2021-02-23 ENCOUNTER — Non-Acute Institutional Stay (SKILLED_NURSING_FACILITY): Payer: Medicare Other | Admitting: Internal Medicine

## 2021-02-23 ENCOUNTER — Encounter: Payer: Self-pay | Admitting: Internal Medicine

## 2021-02-23 DIAGNOSIS — I10 Essential (primary) hypertension: Secondary | ICD-10-CM

## 2021-02-23 DIAGNOSIS — N1831 Chronic kidney disease, stage 3a: Secondary | ICD-10-CM

## 2021-02-23 DIAGNOSIS — D500 Iron deficiency anemia secondary to blood loss (chronic): Secondary | ICD-10-CM

## 2021-02-23 DIAGNOSIS — R110 Nausea: Secondary | ICD-10-CM

## 2021-02-23 DIAGNOSIS — R42 Dizziness and giddiness: Secondary | ICD-10-CM

## 2021-02-23 DIAGNOSIS — R748 Abnormal levels of other serum enzymes: Secondary | ICD-10-CM

## 2021-02-23 DIAGNOSIS — M069 Rheumatoid arthritis, unspecified: Secondary | ICD-10-CM

## 2021-02-23 DIAGNOSIS — F5105 Insomnia due to other mental disorder: Secondary | ICD-10-CM

## 2021-02-23 DIAGNOSIS — R600 Localized edema: Secondary | ICD-10-CM

## 2021-02-23 DIAGNOSIS — F418 Other specified anxiety disorders: Secondary | ICD-10-CM

## 2021-02-23 DIAGNOSIS — T8451XS Infection and inflammatory reaction due to internal right hip prosthesis, sequela: Secondary | ICD-10-CM | POA: Diagnosis not present

## 2021-02-23 DIAGNOSIS — E039 Hypothyroidism, unspecified: Secondary | ICD-10-CM

## 2021-02-23 DIAGNOSIS — R21 Rash and other nonspecific skin eruption: Secondary | ICD-10-CM

## 2021-02-23 NOTE — Progress Notes (Signed)
[facility]   [facility] Room Number: 45 Place of Service:  SNF [phone]) Provider:  [facility]  [name], MD  Patient Care Team: [name], MD as PCP - General (Family Medicine) [name], MD (Inactive) as PCP - Cardiology (Cardiology) [name], MD as PCP - Sleep Medicine (Cardiology) [name], MD (Sports Medicine) [name], MD (Ophthalmology) [name], [name], MD (Dermatology) [name], MD (Gastroenterology) [name], MD (Nephrology) [name], DC as Referring Physician (Chiropractic Medicine)  Extended Emergency Contact Information Primary Emergency ContactSKYLARR, LIZ Home Phone: [phone] Relation: Spouse  Code Status:  DNR Managed Care Goals of care: Advanced Directive information Advanced Directives 02/23/2021  Does Patient Have a Medical Advance Directive? Yes  Type of Advance Directive Living will  Does patient want to make changes to medical advance directive? No - Patient declined  Copy of Healthcare Power of Attorney in Chart? -     Chief Complaint  Patient presents with   Acute Visit    HPI:  Pt is a 80 y.o. 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   Recent Fluid aspiration was positive for Pseudomonas.  She was admitted on 9/6 /22 for removal of hardware with debridement.  Postop patient developed acute renal insufficiency and respiratory failure was intubated. She was extubated on 9/18  Her creatinine peaked at 5.5 She was started  on Zosyn on 9/30 till 10/18   Had a EGD done due to acute blood loss anemia.  It showed mild esophagitis with no active bleeding.  She received 6 units of PRBC Her creatinine eventually improved to 1.3 which is her baseline  Since being here continues ot have Poor Appetite. Has lost 20 lbs in past few weeks. No abdominal pain but Nausea . No Vomiting Feels weak Tired easily. Reflux symptoms are better on Prilosec Also c/p Dizziness when sitting in wheelchair for long time Pain in hip is resolved Using Power chair to transport Not doing transfers yet Does feel depressed. Sleeping well.  She did have elevated LFTS  [place] of liver is negative for any acute changes.     Past Medical History:  Diagnosis Date   Benign hypertension with chronic kidney disease, stage III (HCC)    Overview:  Last Assessment & Plan:  Usually the patient is hypertensive. However today her blood pressure is 105 systolic. She has been feeling fatigued with some lightheadedness. Part of this may be related to her lower blood pressure. Her pressure may be improved with her decrease in salt and fluid intake. I've instructed her to put her lisinopril/hctz on hold. I've asked her to see her primary    Cardiac disease 03/10/2014   Chronic diarrhea 07/27/2015   Edema 07/27/2015   Ejection fraction 2004   Normal, echo,    Gastroesophageal reflux disease    GERD (gastroesophageal reflux disease)    Heart disease 03/10/2014   Hypertension    Left bundle branch block 09/01/2020   Obesity    Obesity (BMI 30-39.9) 09/25/2017   OSA (obstructive sleep  apnea)    mild with AHI 6.75 - on CPAP   Preop cardiovascular exam 11/2010   Cardiac clearance for knee surgery    Sleep apnea 03/10/2014   Supraventricular tachycardia (HCC)    Documented episode in the past, possibly reentrant tachycardia  Overview:  Overview:  Documented episode in the past, possibly reentrant tachycardia  Last Assessment & Plan:  The patient had a  documented episode in the past of a supraventricular tachycardia. It was possibly reentrant. She does well with diltiazem. No change in therapy.   SVT (supraventricular tachycardia) (HCC)    Documented episode in the past, possibly reentrant tachycardia   Thyroid disease    Urinary incontinence    Past Surgical History:  Procedure Laterality Date   CATARACT EXTRACTION Left 8/11   CATARACT EXTRACTION Right 3/12   CHOLECYSTECTOMY  1994   Copsilotomy Laser Treatment Left 6/12   eye   ELBOW SURGERY  8/12   EYE SURGERY Left 09/2008   macular hole   EYE SURGERY Right 12/11   Lumbar Infusion  2011   MOLE REMOVAL  06/17/2015   TOTAL KNEE ARTHROPLASTY Left 6/11   TOTAL KNEE ARTHROPLASTY Right 06/13/2011    Allergies  Allergen Reactions   Keflex [Cephalexin] Diarrhea   Macrobid [Nitrofurantoin] Hives    Allergies as of 02/23/2021       Reactions   Keflex [cephalexin] Diarrhea   Macrobid [nitrofurantoin] Hives        Medication List        Accurate as of February 23, 2021 11:21 AM. If you have any questions, ask your nurse or doctor.          Acetaminophen 325 MG Chew Chew 650 mg by mouth every 6 (six) hours as needed for pain.   amLODipine 10 MG tablet Commonly known as: NORVASC Take 10 mg by mouth daily.   bifidobacterium infantis capsule Take 1 capsule by mouth daily.   CALCIUM PLUS VITAMIN D3 PO Take 1 tablet by mouth daily. Vitamin D3 + Calcium 600   ciprofloxacin 500 MG tablet Commonly known as: CIPRO Take 500 mg by mouth every 12 (twelve) hours.   doxycycline 100 MG tablet Commonly known as: VIBRA-TABS Take 100 mg by mouth every 12 (twelve) hours.   ferrous sulfate 325 (65 FE) MG EC tablet Take 325 mg by mouth. Once A Day on Mon, Wed, Fri;   furosemide 20 MG tablet Commonly known as: LASIX Take 60 mg by mouth 2 (two) times daily.   gabapentin 300 MG capsule Commonly known as: NEURONTIN Take 300 mg by mouth at bedtime.   levothyroxine 25  MCG tablet Commonly known as: SYNTHROID Take 25 mcg by mouth daily before breakfast. Take (2)tabs = 50 mcg; oral every other day   levothyroxine 25 MCG tablet Commonly known as: SYNTHROID Take 25 mcg by mouth every morning. Every other day   loperamide 2 MG capsule Commonly known as: IMODIUM Take 2 mg by mouth every 4 (four) hours as needed.   mirtazapine 15 MG tablet Commonly known as: REMERON Take 15 mg by mouth at bedtime.   omeprazole 20 MG capsule Commonly known as: PRILOSEC Take 20 mg by mouth daily.   ondansetron 4 MG disintegrating tablet Commonly known as: ZOFRAN-ODT Take by mouth.   potassium chloride SA 20 MEQ tablet Commonly known as: KLOR-CON Take 20 mEq by mouth daily. Take BID for 1 week and then QD   traMADol 50 MG tablet Commonly known as: ULTRAM Take 50  mg by mouth every 6 (six) hours as needed.   Vitamin D (Ergocalciferol) 50 MCG (2000 UT) Caps Take by mouth daily.        Review of Systems  Constitutional:  Positive for activity change and appetite change.  HENT: Negative.    Respiratory: Negative.    Cardiovascular: Negative.   Gastrointestinal:  Positive for nausea.  Genitourinary: Negative.   Musculoskeletal:  Positive for gait problem.  Skin: Negative.   Neurological:  Positive for dizziness and weakness.  Psychiatric/Behavioral:  Positive for dysphoric mood.    Immunization History  Administered Date(s) Administered   Influenza, High Dose Seasonal PF 01/11/2017, 01/30/2018, 02/05/2020   Moderna Sars-Covid-2 Vaccination 05/09/2019, 05/27/2019, 03/03/2020, 11/04/2020   Pfizer Covid-19 Vaccine Bivalent Booster 74yrs & up 02/09/2021   Zoster Recombinat (Shingrix) 11/08/2016, 01/11/2017   Pertinent  Health Maintenance Due  Topic Date Due   INFLUENZA VACCINE  11/23/2020   DEXA SCAN  Completed   Fall Risk 10/24/2020  Patient Fall Risk Level Moderate fall risk   Functional Status Survey:    Vitals:   02/23/21 1114  BP: 125/62   Pulse: 81  Resp: 16  Temp: (!) 97 F (36.1 C)  SpO2: 95%  Weight: 254 lb 6.4 oz (115.4 kg)  Height: 5\' 8"  (1.727 m)   Body mass index is 38.68 kg/m. Physical Exam Constitutional: Oriented to person, place, and time. Well-developed and well-nourished.  HENT:  Head: Normocephalic.  Mouth/Throat: Oropharynx is clear and moist.  Eyes: Pupils are equal, round, and reactive to light.  Neck: Neck supple.  Cardiovascular: Normal rate and normal heart sounds.  No murmur heard. Pulmonary/Chest: Effort normal and breath sounds normal. No respiratory distress. No wheezes. She has no rales.  Abdominal: Soft. Bowel sounds are normal. No distension. There is no tenderness. There is no rebound.  Musculoskeletal: No edema.  Lymphadenopathy: none Neurological: Alert and oriented to person, place, and time.  Skin: Skin is warm and dry. Rash Resolved Skin very Dry Psychiatric: Normal mood and affect. Behavior is normal. Thought content normal.   Labs reviewed: Recent Labs    10/24/20 1308 02/08/21 0000 02/15/21 0000 02/17/21 0000  NA 139 140 137 135*  K 4.1 3.0* 3.9 4.6  CL 110 107 102 101  CO2 22 24* 25* 26*  GLUCOSE 96  --   --   --   BUN 15 11 31* 41*  CREATININE 0.98 1.2* 1.2* 1.4*  CALCIUM 9.2 7.5* 8.5* 9.1   Recent Labs    10/24/20 1308 02/08/21 0000 02/15/21 0000 02/17/21 0000  AST 23 36* 271* 205*  ALT 32 24 253* 275*  ALKPHOS 117  --  88 95  BILITOT 0.6  --   --   --   PROT 7.6  --   --   --   ALBUMIN 3.6 2.8* 3.1* 3.2*   Recent Labs    10/24/20 1308 02/08/21 0000 02/15/21 0000  WBC 9.3 8.2 10.4  NEUTROABS 7.0  --  5,741.00  HGB 10.6* 9.9* 10.7*  HCT 34.8* 31* 34*  MCV 83.7  --   --   PLT 430*  --  413*   Lab Results  Component Value Date   TSH 4.520 (H) 03/29/2018   No results found for: HGBA1C No results found for: CHOL, HDL, LDLCALC, LDLDIRECT, TRIG, CHOLHDL  Significant Diagnostic Results in last 30 days:  No results  found.  Assessment/Plan Elevated liver enzymes 14/08/2017 negative ? Etiology Will repeat Labs. 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 Not sure how long Has Follow up with ID this week  Dizziness ? Dehydration With Creat and BUN  up slightly and Weight loss Change Lasix to 40 mg BID Benign essential HTN On Norvasc Insomnia secondary to depression with anxiety Continue Remeron Blood loss anemia On Iron Hgb stable Rheumatoid arthritis involving multiple sites, unspecified whether rheumatoid factor present (HCC) Has been on Plaquenil before Will continue to hold for now No Active symptoms  Edema of lower extremity Change Lasix to 40 mg BID  Hypothyroidism, unspecified type Tsh was 8.33 few weeks ago Will check it again Stage 3a chronic kidney disease (HCC)  Rash Resolved   Family/ staff Communication:   Labs/tests ordered:   Hepatic Panel and TSH

## 2021-02-24 ENCOUNTER — Ambulatory Visit: Payer: Medicare Other | Admitting: Physician Assistant

## 2021-02-25 LAB — BASIC METABOLIC PANEL
BUN: 23 — AB (ref 4–21)
CO2: 26 — AB (ref 13–22)
Chloride: 102 (ref 99–108)
Creatinine: 1.2 — AB (ref 0.5–1.1)
Glucose: 83
Potassium: 3.2 — AB (ref 3.4–5.3)
Sodium: 137 (ref 137–147)

## 2021-02-25 LAB — COMPREHENSIVE METABOLIC PANEL
Albumin: 3 — AB (ref 3.5–5.0)
Calcium: 8.9 (ref 8.7–10.7)
Globulin: 2.7

## 2021-02-25 LAB — HEPATIC FUNCTION PANEL
ALT: 72 — AB (ref 7–35)
AST: 64 — AB (ref 13–35)
Alkaline Phosphatase: 85 (ref 25–125)
Bilirubin, Total: 0.5

## 2021-02-26 ENCOUNTER — Non-Acute Institutional Stay (SKILLED_NURSING_FACILITY): Payer: Medicare Other | Admitting: Nurse Practitioner

## 2021-02-26 ENCOUNTER — Encounter: Payer: Self-pay | Admitting: Nurse Practitioner

## 2021-02-26 DIAGNOSIS — K2100 Gastro-esophageal reflux disease with esophagitis, without bleeding: Secondary | ICD-10-CM

## 2021-02-26 DIAGNOSIS — E039 Hypothyroidism, unspecified: Secondary | ICD-10-CM | POA: Diagnosis not present

## 2021-02-26 DIAGNOSIS — M25551 Pain in right hip: Secondary | ICD-10-CM

## 2021-02-26 DIAGNOSIS — M069 Rheumatoid arthritis, unspecified: Secondary | ICD-10-CM

## 2021-02-26 DIAGNOSIS — N1831 Chronic kidney disease, stage 3a: Secondary | ICD-10-CM

## 2021-02-26 DIAGNOSIS — R748 Abnormal levels of other serum enzymes: Secondary | ICD-10-CM | POA: Diagnosis not present

## 2021-02-26 DIAGNOSIS — T8451XS Infection and inflammatory reaction due to internal right hip prosthesis, sequela: Secondary | ICD-10-CM

## 2021-02-26 DIAGNOSIS — G4733 Obstructive sleep apnea (adult) (pediatric): Secondary | ICD-10-CM

## 2021-02-26 DIAGNOSIS — L89202 Pressure ulcer of unspecified hip, stage 2: Secondary | ICD-10-CM

## 2021-02-26 DIAGNOSIS — K529 Noninfective gastroenteritis and colitis, unspecified: Secondary | ICD-10-CM

## 2021-02-26 DIAGNOSIS — D500 Iron deficiency anemia secondary to blood loss (chronic): Secondary | ICD-10-CM

## 2021-02-26 DIAGNOSIS — I10 Essential (primary) hypertension: Secondary | ICD-10-CM

## 2021-02-26 DIAGNOSIS — E876 Hypokalemia: Secondary | ICD-10-CM

## 2021-02-26 DIAGNOSIS — R600 Localized edema: Secondary | ICD-10-CM

## 2021-02-26 NOTE — Assessment & Plan Note (Signed)
not apparent, takes Furosemide 40mg  bid since 02/23/21

## 2021-02-26 NOTE — Progress Notes (Signed)
Location:   [facility] Room Number: 34 Place of Service:  SNF (31) Provider: Lennie Odor Amias Hutchinson NP  Cari Caraway, MD  Patient Care Team: Cari Caraway, MD as PCP - General (Family Medicine) Dorothy Spark, MD (Inactive) as PCP - Cardiology (Cardiology) Sueanne Margarita, MD as PCP - Sleep Medicine (Cardiology) Donzetta Sprung., MD (Sports Medicine) Linward Natal, MD (Ophthalmology) [name], Katharine Look, MD (Dermatology) Ronald Lobo, MD (Gastroenterology) Thereasa Distance, MD (Nephrology) Jola Baptist, DC as Referring Physician (Chiropractic Medicine)  Extended Emergency Contact Information Primary Emergency ContactCAITRIN, [name] Home Phone: [phone] Relation: Spouse  Code Status: DNR Goals of care: Advanced Directive information Advanced Directives 02/23/2021  Does Patient Have a Medical Advance Directive? Yes  Type of Advance Directive Living will  Does patient want to make changes to medical advance directive? No - Patient declined  Copy of [place] in Chart? -     Chief Complaint  Patient presents with   Acute Visit    Lower K, Bp    HPI:  Pt is a 80 y.o. female seen today for an acute visit for low Bp, Low serum K, asymptomatic per patient      skin breakdown [name] just below the gluteal fold [name], pressure and friction mostly likely the culprit of the small areas of skin missing.                             Elevated AST 271>>205 02/17/21>>64 02/25/21  ALT 253>>275 02/17/21>>72 02/25/21 since Cipro started, informed ID, normal alk phos, bilirubin, Amylase, Lipase. S/p cholecystectomy, [place] 02/19/21 no cyst or mass.               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 F/u Ortho 6 weeks. TDWB. 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 dc'd PICC, on Cipro  and Doxy             Anemia, post op, s/p EGD no active bleed. S/p 6 u PRBC transfusion,  ASA was dc'd, Hgb 10.7 02/15/21. On Fe             HTN takes Amlodipine, Bun/creat 23/1.23 eGFR 44 02/25/21             Morbid obesity             OSA CPAP             Chronic diarrhea, prn Imodium, Align, Welchol             RA may resume Plaquenil              Insomnia/depression/anxiety, takes Mirtazapine             GERD, stable, on Omeprazole, prn Zofran.              Edema, not apparent, takes Furosemide $RemoveBeforeDE'40mg'[id number]$  bid since 02/23/21 CKD Bun/creat 23/1.23, eGFR 44 02/25/21             Hypothyroidism, takes Levothyroxine 47mcg qd, TSH 8.33 02/09/21>>1.67 02/25/21              Past Medical History:  Diagnosis Date   Benign hypertension with chronic kidney disease, stage III ([place])    Overview:  Last Assessment & Plan:  Usually the patient is hypertensive. However today her blood  pressure is 801 systolic. She has been feeling fatigued with some lightheadedness. Part of this may be related to her lower blood pressure. Her pressure may be improved with her decrease in salt and fluid intake. I've instructed her to put her lisinopril/hctz on hold. I've asked her to see her primary    Cardiac disease 03/10/2014   Chronic diarrhea 07/27/2015   Edema 07/27/2015   Ejection fraction 2004   Normal, echo,    Gastroesophageal reflux disease    GERD (gastroesophageal reflux disease)    Heart disease 03/10/2014   Hypertension    Left bundle branch block 09/01/2020   Obesity    Obesity (BMI 30-39.9) 09/25/2017   OSA (obstructive sleep apnea)    mild with AHI 6.75 - on CPAP   Preop cardiovascular exam 11/2010   Cardiac clearance for knee surgery    Sleep apnea 03/10/2014   Supraventricular tachycardia ([place])    Documented episode in the past, possibly reentrant tachycardia  Overview:  Overview:  Documented episode in the past, 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 It was possibly reentrant. She does well  with diltiazem. No change in therapy.   SVT (supraventricular tachycardia) ([place])    Documented episode in the past, possibly reentrant tachycardia   Thyroid disease    Urinary incontinence    Past Surgical History:  Procedure Laterality Date   CATARACT EXTRACTION Left 8/11   CATARACT EXTRACTION Right 3/12   CHOLECYSTECTOMY  1994   Copsilotomy Laser Treatment Left 6/12   eye   ELBOW SURGERY  8/12   EYE SURGERY Left 09/2008   macular hole   EYE SURGERY Right 12/11   Lumbar Infusion  2011   MOLE REMOVAL  06/17/2015   TOTAL KNEE ARTHROPLASTY Left 6/11   TOTAL KNEE ARTHROPLASTY Right 06/13/2011    Allergies  Allergen Reactions   Keflex [Cephalexin] Diarrhea   Macrobid [Nitrofurantoin] Hives    Allergies as of 02/26/2021       Reactions   Keflex [cephalexin] Diarrhea   Macrobid [nitrofurantoin] Hives        Medication List        Accurate as of February 26, 2021 11:59 PM. If you have any questions, ask your nurse or doctor.          Acetaminophen 325 MG Chew Chew 650 mg by mouth every 6 (six) hours as needed for pain.   amLODipine 10 MG tablet Commonly known as: NORVASC Take 10 mg by mouth daily.   bifidobacterium infantis capsule Take 1 capsule by mouth daily.   CALCIUM PLUS VITAMIN D3 PO Take 1 tablet by mouth daily. Vitamin D3 68mg + Calcium 600   ferrous sulfate 325 (65 FE) MG EC tablet Take 325 mg by mouth. Once A Day on Mon, Wed, Fri;   furosemide 20 MG tablet Commonly known as: LASIX Take 40 mg by mouth 2 (two) times daily.   gabapentin 300 MG capsule Commonly known as: NEURONTIN Take 300 mg by mouth at bedtime.   levothyroxine 25 MCG tablet Commonly known as: SYNTHROID Take 25 mcg by mouth daily before breakfast. Take (2)tabs = 50 mcg; oral every other day   levothyroxine 25 MCG tablet Commonly known as: SYNTHROID Take 25 mcg by mouth every morning. Every other day   loperamide 2 MG capsule Commonly known as: IMODIUM Take 2 mg by mouth  every 4 (four) hours as needed.  mirtazapine 15 MG tablet Commonly known as: REMERON Take 15 mg by mouth at bedtime.   omeprazole 20 MG capsule Commonly known as: PRILOSEC Take 20 mg by mouth daily.   ondansetron 4 MG disintegrating tablet Commonly known as: ZOFRAN-ODT Take by mouth.   potassium chloride SA 20 MEQ tablet Commonly known as: KLOR-CON Take 20 mEq by mouth daily.   traMADol 50 MG tablet Commonly known as: ULTRAM Take 50 mg by mouth every 6 (six) hours as needed.   Vitamin D (Ergocalciferol) 50 MCG (2000 UT) Caps Take by mouth daily.        Review of Systems  Constitutional:  Negative for appetite change, fatigue and fever.  HENT:  Negative for trouble swallowing.   Eyes:  Negative for visual disturbance.  Respiratory:  Negative for cough and shortness of breath.   Cardiovascular:  Negative for leg swelling.  Gastrointestinal:  Negative for abdominal pain and constipation.  Genitourinary:  Negative for dysuria.  Musculoskeletal:  Positive for arthralgias and gait problem. Negative for joint swelling.  Skin:        Posterior [name] thigh skin breakdown.   Neurological:  Negative for speech difficulty, weakness and headaches.  Psychiatric/Behavioral:  Negative for confusion and sleep disturbance. The patient is not nervous/anxious.    Immunization History  Administered Date(s) Administered   Influenza, High Dose Seasonal PF 01/11/2017, 01/30/2018, 02/05/2020   Zoster Recombinat (Shingrix) 11/08/2016, 01/11/2017   Pertinent  Health Maintenance Due  Topic Date Due   INFLUENZA VACCINE  11/23/2020   DEXA SCAN  Completed   Fall Risk 10/24/2020  Patient Fall Risk Level Moderate fall risk   Functional Status Survey:    Vitals:   02/26/21 1112  BP: (!) 114/50  Pulse: 65  Resp: 18  Temp: (!) 96 F (35.6 C)  SpO2: 98%   There is no height or weight on file to calculate BMI. Physical Exam Vitals and nursing note reviewed.  Constitutional:       Appearance: She is obese.  HENT:     Head: Normocephalic and atraumatic.     Nose: Nose normal.     Mouth/Throat:     Mouth: Mucous membranes are moist.  Eyes:     Extraocular Movements: Extraocular movements intact.     Conjunctiva/sclera: Conjunctivae normal.     Pupils: Pupils are equal, round, and reactive to light.  Cardiovascular:     Rate and Rhythm: Normal rate and regular rhythm.     Heart sounds: No murmur heard. Pulmonary:     Effort: Pulmonary effort is normal.  Abdominal:     General: Bowel sounds are normal.     Palpations: Abdomen is soft.     Tenderness: There is no abdominal tenderness. There is no right CVA tenderness, left CVA tenderness, guarding or rebound.  Musculoskeletal:        General: No swelling or tenderness.     Cervical back: Normal range of motion and neck supple.     Right lower leg: No edema.     Left lower leg: No edema.     Comments: R hip.   Skin:    General: Skin is warm and dry.     Comments: Right hip surgical scar. Posterior [name] upper thighs, just below the gluteal folds, a dime sized skin missing open wound [name], no s/s of infection.   Neurological:     General: No focal deficit present.     Mental Status: She is alert.  Psychiatric:  Mood and Affect: Mood normal.        Behavior: Behavior normal.        Thought Content: Thought content normal.        Judgment: Judgment normal.    Labs reviewed: Recent Labs    10/24/20 1308 02/08/21 0000 02/15/21 0000 02/17/21 0000  NA 139 140 137 135*  K 4.1 3.0* 3.9 4.6  CL 110 107 102 101  CO2 22 24* 25* 26*  GLUCOSE 96  --   --   --   BUN 15 11 31* 41*  CREATININE 0.98 1.2* 1.2* 1.4*  CALCIUM 9.2 7.5* 8.5* 9.1   Recent Labs    10/24/20 1308 02/08/21 0000 02/15/21 0000 02/17/21 0000  AST 23 36* 271* 205*  ALT 32 24 253* 275*  ALKPHOS 117  --  88 95  BILITOT 0.6  --   --   --   PROT 7.6  --   --   --   ALBUMIN 3.6 2.8* 3.1* 3.2*   Recent Labs    10/24/20 1308  02/08/21 0000 02/15/21 0000  WBC 9.3 8.2 10.4  NEUTROABS 7.0  --  5,741.00  HGB 10.6* 9.9* 10.7*  HCT 34.8* 31* 34*  MCV 83.7  --   --   PLT 430*  --  413*   Lab Results  Component Value Date   TSH 4.520 (H) 03/29/2018   No results found for: HGBA1C No results found for: CHOL, HDL, LDLCALC, LDLDIRECT, TRIG, CHOLHDL  Significant Diagnostic Results in last 30 days:  No results found.  Assessment/Plan: Benign essential HTN Runs low, will decreased Amlodipine 92m qd, continue Furosemide 44mbid, observe.   Hypokalemia Serum K 3.2 02/25/21, will increase Kcl 4078mqd, update CMP/eGFR one week.   Elevated liver enzymes 02/25/21 Na137, K 3.2, Bun 23, creat 1.23, eGFR 44, AST 64, ALT 72  Hypothyroidism  takes Levothyroxine 53m21md, TSH 8.33 02/09/21>>1.67 02/25/21  CKD (chronic kidney disease) stage 3, GFR 30-59 ml/min (HCC) Bun/creat 23/1.23, eGFR 44 02/25/21  Edema of lower extremity not apparent, takes Furosemide 40mg57m since 02/23/21  Gastroesophageal reflux disease  stable, on Omeprazole, prn Zofran.   Rheumatoid arthritis (HCC) [place] resume Plaquenil   Chronic diarrhea prn Imodium, Align, Welchol  OSA (obstructive sleep apnea) Uses CPAP  Blood loss anemia post op, s/p EGD no active bleed. S/p 6 u PRBC transfusion, ASA was dc'd, Hgb 10.7 02/15/21. On Fe  Pressure ulcer of thigh, stage 2 (HCC) Left thigh, no change of tx.   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 dc'd PICC, on Cipro  and Doxy. 02/24/21 ID continue Cipro, Doxy, f/u ID,  f/u Ortho 03/04/21  Right hip pain R hip pain, s/p right THR 2018, removal of hardware 12/29/20,  takes Tylenol, Gabapentin. F/u Ortho 6 weeks. TDWB. Power wc   Museum/gallery exhibitions officerunication: plan of care reviewed with the patient a  Labs/tests ordered:  CMP/eGFR one week  Time spend 35 minutes.

## 2021-02-26 NOTE — Assessment & Plan Note (Signed)
Serum K 3.2 02/25/21, will increase Kcl 37mq qd, update CMP/eGFR one week.

## 2021-02-26 NOTE — Assessment & Plan Note (Signed)
Bun/creat 23/1.23, eGFR 44 02/25/21

## 2021-02-26 NOTE — Assessment & Plan Note (Signed)
Left thigh, no change of tx.

## 2021-02-26 NOTE — Assessment & Plan Note (Signed)
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 F/u Ortho 6 weeks. TDWB. Power wc

## 2021-02-26 NOTE — Assessment & Plan Note (Signed)
takes Levothyroxine qd, TSH 8.33 02/09/21>>1.67 02/25/21

## 2021-02-26 NOTE — Assessment & Plan Note (Signed)
Uses CPAP 

## 2021-02-26 NOTE — Assessment & Plan Note (Addendum)
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 dc'd PICC, on Cipro  and Doxy. 02/24/21 ID continue Cipro, Doxy, f/u ID,  f/u Ortho 03/04/21

## 2021-02-26 NOTE — Assessment & Plan Note (Signed)
Runs low, will decreased Amlodipine 5mg  qd, continue Furosemide 40mg  bid, observe.

## 2021-02-26 NOTE — Assessment & Plan Note (Signed)
post op, s/p EGD no active bleed. S/p 6 u PRBC transfusion, ASA was dc'd, Hgb 10.7 02/15/21. On Fe

## 2021-02-26 NOTE — Assessment & Plan Note (Signed)
stable, on Omeprazole, prn Zofran.  

## 2021-02-26 NOTE — Assessment & Plan Note (Signed)
may resume Plaquenil

## 2021-02-26 NOTE — Assessment & Plan Note (Signed)
02/25/21 Na137, K 3.2, Bun 23, creat 1.23, eGFR 44, AST 64, ALT 72

## 2021-02-26 NOTE — Assessment & Plan Note (Signed)
prn Imodium, Align, Welchol °

## 2021-03-02 ENCOUNTER — Encounter: Payer: Self-pay | Admitting: Nurse Practitioner

## 2021-03-05 LAB — BASIC METABOLIC PANEL
BUN: 22 — AB (ref 4–21)
Chloride: 104 (ref 99–108)
Creatinine: 1.1 (ref 0.5–1.1)
Glucose: 80
Potassium: 4 (ref 3.4–5.3)
Sodium: 137 (ref 137–147)

## 2021-03-05 LAB — HEPATIC FUNCTION PANEL
ALT: 47 — AB (ref 7–35)
AST: 44 — AB (ref 13–35)
Alkaline Phosphatase: 80 (ref 25–125)
Bilirubin, Total: 0.4

## 2021-03-05 LAB — COMPREHENSIVE METABOLIC PANEL
Albumin: 2.7 — AB (ref 3.5–5.0)
Calcium: 8.5 — AB (ref 8.7–10.7)
Globulin: 2.4

## 2021-03-17 ENCOUNTER — Non-Acute Institutional Stay (SKILLED_NURSING_FACILITY): Payer: Medicare Other | Admitting: Nurse Practitioner

## 2021-03-17 ENCOUNTER — Encounter: Payer: Self-pay | Admitting: Nurse Practitioner

## 2021-03-17 DIAGNOSIS — R600 Localized edema: Secondary | ICD-10-CM

## 2021-03-17 DIAGNOSIS — K2100 Gastro-esophageal reflux disease with esophagitis, without bleeding: Secondary | ICD-10-CM

## 2021-03-17 DIAGNOSIS — T8451XS Infection and inflammatory reaction due to internal right hip prosthesis, sequela: Secondary | ICD-10-CM

## 2021-03-17 DIAGNOSIS — M25551 Pain in right hip: Secondary | ICD-10-CM | POA: Diagnosis not present

## 2021-03-17 DIAGNOSIS — E039 Hypothyroidism, unspecified: Secondary | ICD-10-CM

## 2021-03-17 DIAGNOSIS — I10 Essential (primary) hypertension: Secondary | ICD-10-CM

## 2021-03-17 DIAGNOSIS — L89202 Pressure ulcer of unspecified hip, stage 2: Secondary | ICD-10-CM | POA: Diagnosis not present

## 2021-03-17 DIAGNOSIS — F5105 Insomnia due to other mental disorder: Secondary | ICD-10-CM

## 2021-03-17 DIAGNOSIS — N1831 Chronic kidney disease, stage 3a: Secondary | ICD-10-CM

## 2021-03-17 DIAGNOSIS — F418 Other specified anxiety disorders: Secondary | ICD-10-CM

## 2021-03-17 DIAGNOSIS — D500 Iron deficiency anemia secondary to blood loss (chronic): Secondary | ICD-10-CM

## 2021-03-17 DIAGNOSIS — R748 Abnormal levels of other serum enzymes: Secondary | ICD-10-CM

## 2021-03-17 NOTE — Assessment & Plan Note (Signed)
Stable

## 2021-03-17 NOTE — Assessment & Plan Note (Signed)
low Bp 125-90/72-44, denied dizziness, chest pain/pressure, or palpitation. Dc Amlodipine.

## 2021-03-17 NOTE — Assessment & Plan Note (Addendum)
Skin breakdown [name] just below the gluteal fold [name], pressure and friction mostly likely the culprit of the small areas of skin missing, healing.

## 2021-03-17 NOTE — Assessment & Plan Note (Addendum)
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 Fully treated, off antibiotics.

## 2021-03-17 NOTE — Assessment & Plan Note (Signed)
stable, on Omeprazole, prn Zofran.  

## 2021-03-17 NOTE — Assessment & Plan Note (Signed)
Elevated AST 271>>205 02/17/21>>64 02/25/21>>47 03/04/21,  ALT 253>>275 02/17/21>>72 02/25/21>>44 03/04/21, normal alk phos, bilirubin, Amylase, Lipase. S/p cholecystectomy, [place] 02/19/21 no cyst or mass.

## 2021-03-17 NOTE — Assessment & Plan Note (Signed)
not apparent, takes Furosemide  

## 2021-03-17 NOTE — Assessment & Plan Note (Signed)
Stable,  takes Mirtazapine 

## 2021-03-17 NOTE — Assessment & Plan Note (Signed)
post op, s/p EGD no active bleed. S/p 6 u PRBC transfusion, ASA was dc'd, Hgb 10.7 02/15/21. On Fe

## 2021-03-17 NOTE — Progress Notes (Signed)
Location:   SNF [facility] Room Number: 31 Place of Service:  SNF (31) Provider: Lennie Odor Sonni Barse NP  Cari Caraway, MD  Patient Care Team: Cari Caraway, MD as PCP - General (Family Medicine) Dorothy Spark, MD (Inactive) as PCP - Cardiology (Cardiology) Sueanne Margarita, MD as PCP - Sleep Medicine (Cardiology) Donzetta Sprung., MD (Sports Medicine) Linward Natal, MD (Ophthalmology) [name], Katharine Look, MD (Dermatology) Ronald Lobo, MD (Gastroenterology) Thereasa Distance, MD (Nephrology) Jola Baptist, DC as Referring Physician (Chiropractic Medicine)  Extended Emergency Contact Information Primary Emergency ContactEVANNA, WASHINTON Home Phone: [phone] Relation: Spouse  Code Status: DNR Goals of care: Advanced Directive information Advanced Directives 02/23/2021  Does Patient Have a Medical Advance Directive? Yes  Type of Advance Directive Living will  Does patient want to make changes to medical advance directive? No - Patient declined  Copy of [place] in Chart? -     Chief Complaint  Patient presents with   Acute Visit    Low blood pressure    HPI:  Pt is a 80 y.o. female seen today for an acute visit for low Bp 125-90/72-44, denied dizziness, chest pain/pressure, or palpitation.     Skin breakdown [name] just below the gluteal fold [name], pressure and friction mostly likely the culprit of the small areas of skin missing.              Elevated AST 271>>205 02/17/21>>64 02/25/21>>47 03/04/21,  ALT 253>>275 02/17/21>>72 02/25/21>>44 03/04/21, normal alk phos, bilirubin, Amylase, Lipase. S/p cholecystectomy, [place] 02/19/21 no cyst or mass.               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 F/u Ortho. TDWB. 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 Fully treated, off antibiotics.              Anemia, post op, s/p EGD no active bleed. S/p 6 u PRBC  transfusion, ASA was dc'd, Hgb 10.7 02/15/21. On Fe             HTN off Amlodipine 2/2 low Bp             Morbid obesity             OSA CPAP             Chronic diarrhea, prn Imodium, Align, Welchol             RA may resume Plaquenil              Insomnia/depression/anxiety, takes Mirtazapine             GERD, stable, on Omeprazole, prn Zofran.              Edema, not apparent, takes Furosemide  CKD stage 3             Hypothyroidism, takes Levothyroxine 30mg qd, TSH 8.33 02/09/21>>1.67 02/25/21 Past Medical History:  Diagnosis Date   Benign hypertension with chronic kidney disease, stage III ([place]    Overview:  Last Assessment & Plan:  Usually the patient is hypertensive. However today her blood pressure is 1923systolic. She has been feeling fatigued with some lightheadedness. Part of this may be related to her lower blood pressure. Her pressure may be improved with her decrease in salt and fluid intake. I've instructed her to put her lisinopril/hctz  on hold. I've asked her to see her primary    Cardiac disease 03/10/2014   Chronic diarrhea 07/27/2015   Edema 07/27/2015   Ejection fraction 2004   Normal, echo,    Gastroesophageal reflux disease    GERD (gastroesophageal reflux disease)    Heart disease 03/10/2014   Hypertension    Left bundle branch block 09/01/2020   Obesity    Obesity (BMI 30-39.9) 09/25/2017   OSA (obstructive sleep apnea)    mild with AHI 6.75 - on CPAP   Preop cardiovascular exam 11/2010   Cardiac clearance for knee surgery    Sleep apnea 03/10/2014   Supraventricular tachycardia ([place])    Documented episode in the past, possibly reentrant tachycardia  Overview:  Overview:  Documented episode in the past, 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 It was possibly reentrant. She does well with diltiazem. No change in therapy.   SVT (supraventricular tachycardia)  ([place])    Documented episode in the past, possibly reentrant tachycardia   Thyroid disease    Urinary incontinence    Past Surgical History:  Procedure Laterality Date   CATARACT EXTRACTION Left 8/11   CATARACT EXTRACTION Right 3/12   CHOLECYSTECTOMY  1994   Copsilotomy Laser Treatment Left 6/12   eye   ELBOW SURGERY  8/12   EYE SURGERY Left 09/2008   macular hole   EYE SURGERY Right 12/11   Lumbar Infusion  2011   MOLE REMOVAL  06/17/2015   TOTAL KNEE ARTHROPLASTY Left 6/11   TOTAL KNEE ARTHROPLASTY Right 06/13/2011    Allergies  Allergen Reactions   Keflex [Cephalexin] Diarrhea   Macrobid [Nitrofurantoin] Hives    Allergies as of 03/17/2021       Reactions   Keflex [cephalexin] Diarrhea   Macrobid [nitrofurantoin] Hives        Medication List        Accurate as of March 17, 2021 11:59 PM. If you have any questions, ask your nurse or doctor.          STOP taking these medications    amLODipine 10 MG tablet Commonly known as: NORVASC Stopped by: Stephanie Mcglone X Tericka Devincenzi, NP       TAKE these medications    Acetaminophen 325 MG Chew Chew 650 mg by mouth every 6 (six) hours as needed for pain.   bifidobacterium infantis capsule Take 1 capsule by mouth daily.   CALCIUM PLUS VITAMIN D3 PO Take 1 tablet by mouth daily. Vitamin D3 34mcg + Calcium 600   ferrous sulfate 325 (65 FE) MG EC tablet Take 325 mg by mouth. Once A Day on Mon, Wed, Fri;   furosemide 20 MG tablet Commonly known as: LASIX Take 40 mg by mouth 2 (two) times daily.   gabapentin 300 MG capsule Commonly known as: NEURONTIN Take 300 mg by mouth at bedtime.   levothyroxine 25 MCG tablet Commonly known as: SYNTHROID Take 25 mcg by mouth daily before breakfast. Take (2)tabs = 50 mcg; oral every other day   levothyroxine 25 MCG tablet Commonly known as: SYNTHROID Take 25 mcg by mouth every morning. Every other day   loperamide 2 MG capsule Commonly known as: IMODIUM Take 2 mg by mouth  every 4 (four) hours as needed.   mirtazapine 15 MG tablet Commonly known as: REMERON Take 15 mg by mouth at bedtime.   omeprazole 20 MG capsule Commonly known as: PRILOSEC Take  20 mg by mouth daily.   ondansetron 4 MG disintegrating tablet Commonly known as: ZOFRAN-ODT Take by mouth.   potassium chloride SA 20 MEQ tablet Commonly known as: KLOR-CON Take 20 mEq by mouth daily.   traMADol 50 MG tablet Commonly known as: ULTRAM Take 50 mg by mouth every 6 (six) hours as needed.   Vitamin D (Ergocalciferol) 50 MCG (2000 UT) Caps Take by mouth daily.        Review of Systems  Constitutional:  Negative for appetite change, fatigue and fever.  HENT:  Negative for trouble swallowing.   Eyes:  Negative for visual disturbance.  Respiratory:  Negative for cough.   Cardiovascular:  Negative for leg swelling.  Gastrointestinal:  Negative for abdominal pain and constipation.  Genitourinary:  Negative for dysuria.  Musculoskeletal:  Positive for arthralgias and gait problem. Negative for joint swelling.  Skin:        Posterior [name] thigh skin breakdown.   Neurological:  Negative for speech difficulty, weakness and headaches.  Psychiatric/Behavioral:  Negative for confusion and sleep disturbance. The patient is not nervous/anxious.    Immunization History  Administered Date(s) Administered   Influenza, High Dose Seasonal PF 01/11/2017, 01/30/2018, 02/05/2020   Zoster Recombinat (Shingrix) 11/08/2016, 01/11/2017   Pertinent  Health Maintenance Due  Topic Date Due   INFLUENZA VACCINE  11/23/2020   DEXA SCAN  Completed   Fall Risk 10/24/2020  Patient Fall Risk Level Moderate fall risk   Functional Status Survey:    Vitals:   03/17/21 1404  BP: (!) 121/52  Pulse: 68  Resp: 18   There is no height or weight on file to calculate BMI. Physical Exam Vitals and nursing note reviewed.  Constitutional:      Appearance: She is obese.  HENT:     Head: Normocephalic and  atraumatic.     Nose: Nose normal.     Mouth/Throat:     Mouth: Mucous membranes are moist.  Eyes:     Extraocular Movements: Extraocular movements intact.     Conjunctiva/sclera: Conjunctivae normal.     Pupils: Pupils are equal, round, and reactive to light.  Cardiovascular:     Rate and Rhythm: Normal rate and regular rhythm.     Heart sounds: No murmur heard. Pulmonary:     Effort: Pulmonary effort is normal.  Abdominal:     General: Bowel sounds are normal.     Palpations: Abdomen is soft.     Tenderness: There is no abdominal tenderness. There is no right CVA tenderness, left CVA tenderness, guarding or rebound.  Musculoskeletal:        General: No swelling or tenderness.     Cervical back: Normal range of motion and neck supple.     Right lower leg: No edema.     Left lower leg: No edema.     Comments: R hip, prosthetic hip removed.   Skin:    General: Skin is warm and dry.     Comments: Right hip surgical scar. Posterior [name] upper thighs, just below the gluteal folds, a linear skin missing open wound [name], no s/s of infection, healing.   Neurological:     General: No focal deficit present.     Mental Status: She is alert.  Psychiatric:        Mood and Affect: Mood normal.        Behavior: Behavior normal.        Thought Content: Thought content normal.  Judgment: Judgment normal.    Labs reviewed: Recent Labs    10/24/20 1308 02/08/21 0000 02/15/21 0000 02/17/21 0000  NA 139 140 137 135*  K 4.1 3.0* 3.9 4.6  CL 110 107 102 101  CO2 22 24* 25* 26*  GLUCOSE 96  --   --   --   BUN 15 11 31* 41*  CREATININE 0.98 1.2* 1.2* 1.4*  CALCIUM 9.2 7.5* 8.5* 9.1   Recent Labs    10/24/20 1308 02/08/21 0000 02/15/21 0000 02/17/21 0000  AST 23 36* 271* 205*  ALT 32 24 253* 275*  ALKPHOS 117  --  88 95  BILITOT 0.6  --   --   --   PROT 7.6  --   --   --   ALBUMIN 3.6 2.8* 3.1* 3.2*   Recent Labs    10/24/20 1308 02/08/21 0000 02/15/21 0000  WBC 9.3  8.2 10.4  NEUTROABS 7.0  --  5,741.00  HGB 10.6* 9.9* 10.7*  HCT 34.8* 31* 34*  MCV 83.7  --   --   PLT 430*  --  413*   Lab Results  Component Value Date   TSH 4.520 (H) 03/29/2018   No results found for: HGBA1C No results found for: CHOL, HDL, LDLCALC, LDLDIRECT, TRIG, CHOLHDL  Significant Diagnostic Results in last 30 days:  No results found.  Assessment/Plan: Benign essential HTN low Bp 125-90/72-44, denied dizziness, chest pain/pressure, or palpitation. Dc Amlodipine.   Pressure ulcer of thigh, stage 2 (HCC) Skin breakdown [name] just below the gluteal fold [name], pressure and friction mostly likely the culprit of the small areas of skin missing, healing.   Elevated liver enzymes Elevated AST 271>>205 02/17/21>>64 02/25/21>>47 03/04/21,  ALT 253>>275 02/17/21>>72 02/25/21>>44 03/04/21, normal alk phos, bilirubin, Amylase, Lipase. S/p cholecystectomy, [place] 02/19/21 no cyst or mass.   Right hip pain R hip pain, s/p right THR 2018, removal of hardware 12/29/20,  takes Tylenol, Gabapentin. F/u Ortho. TDWB. 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 Fully treated, off antibiotics.   Blood loss anemia  post op, s/p EGD no active bleed. S/p 6 u PRBC transfusion, ASA was dc'd, Hgb 10.7 02/15/21. On Fe  Insomnia secondary to depression with anxiety Stable, takes Mirtazapine  Gastroesophageal reflux disease stable, on Omeprazole, prn Zofran.   Edema of lower extremity not apparent, takes Furosemide   CKD (chronic kidney disease) stage 3, GFR 30-59 ml/min (HCC) Stable.   Hypothyroidism  takes Levothyroxine 9mg qd, TSH 8.33 02/09/21>>1.67 02/25/21    Family/ staff Communication: plan of care reviewed with the patient and charge nurse.   Labs/tests ordered:  none  Time spend 35 minutes.

## 2021-03-17 NOTE — Assessment & Plan Note (Signed)
takes Levothyroxine qd, TSH 8.33 02/09/21>>1.67 02/25/21

## 2021-03-17 NOTE — Assessment & Plan Note (Signed)
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 F/u Ortho. TDWB. Power wc

## 2021-03-22 ENCOUNTER — Encounter: Payer: Self-pay | Admitting: Nurse Practitioner

## 2021-03-22 ENCOUNTER — Non-Acute Institutional Stay (SKILLED_NURSING_FACILITY): Payer: Medicare Other | Admitting: Nurse Practitioner

## 2021-03-22 DIAGNOSIS — K2100 Gastro-esophageal reflux disease with esophagitis, without bleeding: Secondary | ICD-10-CM

## 2021-03-22 DIAGNOSIS — G4733 Obstructive sleep apnea (adult) (pediatric): Secondary | ICD-10-CM | POA: Diagnosis not present

## 2021-03-22 DIAGNOSIS — R600 Localized edema: Secondary | ICD-10-CM

## 2021-03-22 DIAGNOSIS — E039 Hypothyroidism, unspecified: Secondary | ICD-10-CM

## 2021-03-22 DIAGNOSIS — M25551 Pain in right hip: Secondary | ICD-10-CM

## 2021-03-22 DIAGNOSIS — K529 Noninfective gastroenteritis and colitis, unspecified: Secondary | ICD-10-CM | POA: Diagnosis not present

## 2021-03-22 DIAGNOSIS — T8451XS Infection and inflammatory reaction due to internal right hip prosthesis, sequela: Secondary | ICD-10-CM

## 2021-03-22 DIAGNOSIS — F5105 Insomnia due to other mental disorder: Secondary | ICD-10-CM

## 2021-03-22 DIAGNOSIS — I10 Essential (primary) hypertension: Secondary | ICD-10-CM

## 2021-03-22 DIAGNOSIS — D500 Iron deficiency anemia secondary to blood loss (chronic): Secondary | ICD-10-CM

## 2021-03-22 DIAGNOSIS — M069 Rheumatoid arthritis, unspecified: Secondary | ICD-10-CM

## 2021-03-22 DIAGNOSIS — L89202 Pressure ulcer of unspecified hip, stage 2: Secondary | ICD-10-CM

## 2021-03-22 DIAGNOSIS — F418 Other specified anxiety disorders: Secondary | ICD-10-CM

## 2021-03-22 DIAGNOSIS — R748 Abnormal levels of other serum enzymes: Secondary | ICD-10-CM

## 2021-03-22 DIAGNOSIS — N1831 Chronic kidney disease, stage 3a: Secondary | ICD-10-CM

## 2021-03-22 NOTE — Assessment & Plan Note (Signed)
CPAP.  

## 2021-03-22 NOTE — Assessment & Plan Note (Signed)
Bun/creat 41/1.4 02/17/21

## 2021-03-22 NOTE — Assessment & Plan Note (Addendum)
may resume Plaquenil, f/u Rheumatology.

## 2021-03-22 NOTE — Assessment & Plan Note (Signed)
Still runs low, but better than prior, off Amlodipine.

## 2021-03-22 NOTE — Assessment & Plan Note (Signed)
Stable,  prn Imodium, Align, Welchol

## 2021-03-22 NOTE — Assessment & Plan Note (Signed)
not apparent, takes Furosemide  

## 2021-03-22 NOTE — Assessment & Plan Note (Signed)
post op, s/p EGD no active bleed. S/p 6 u PRBC transfusion, ASA was dc'd, Hgb 10.7 02/15/21. On Fe

## 2021-03-22 NOTE — Assessment & Plan Note (Signed)
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 F/u Ortho. TDWB. Power wc

## 2021-03-22 NOTE — Assessment & Plan Note (Signed)
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 Fully treated, off antibiotics.

## 2021-03-22 NOTE — Assessment & Plan Note (Signed)
stable, on Omeprazole, prn Zofran.  

## 2021-03-22 NOTE — Assessment & Plan Note (Signed)
Skin breakdown [name] just below the gluteal fold [name], pressure and friction mostly likely the culprit of the small areas of skin missing.

## 2021-03-22 NOTE — Assessment & Plan Note (Addendum)
takes Levothyroxine qd, TSH 8.33 02/09/21>>1.67 02/25/21<<4.15 03/23/21

## 2021-03-22 NOTE — Assessment & Plan Note (Signed)
elevated AST 271>>205 02/17/21>>64 02/25/21>>47 03/04/21,  ALT 253>>275 02/17/21>>72 02/25/21>>44 03/04/21, normal alk phos, bilirubin, Amylase, Lipase. S/p cholecystectomy, [place] 02/19/21 no cyst or mass.

## 2021-03-22 NOTE — Progress Notes (Signed)
Location:   SNF [facility] Room Number: 72 Place of Service:  SNF (31) Provider: Lennie Odor Krithi Bray NP  Cari Caraway, MD  Patient Care Team: Cari Caraway, MD as PCP - General (Family Medicine) Dorothy Spark, MD (Inactive) as PCP - Cardiology (Cardiology) Sueanne Margarita, MD as PCP - Sleep Medicine (Cardiology) Donzetta Sprung., MD (Sports Medicine) Linward Natal, MD (Ophthalmology) [name], Katharine Look, MD (Dermatology) Ronald Lobo, MD (Gastroenterology) Thereasa Distance, MD (Nephrology) Jola Baptist, DC as Referring Physician (Chiropractic Medicine)  Extended Emergency Contact Information Primary Emergency ContactMARDELLA, NUCKLES Home Phone: [phone] Relation: Spouse  Code Status:  DNR Goals of care: Advanced Directive information Advanced Directives 02/23/2021  Does Patient Have a Medical Advance Directive? Yes  Type of Advance Directive Living will  Does patient want to make changes to medical advance directive? No - Patient declined  Copy of [place] in Chart? -     Chief Complaint  Patient presents with   Medical Management of Chronic Issues    HPI:  Pt is a 80 y.o. female seen today for medical management of chronic diseases.    Skin breakdown [name] just below the gluteal fold [name], pressure and friction mostly likely the culprit of the small areas of skin missing.              Elevated AST 271>>205 02/17/21>>64 02/25/21>>47 03/04/21,  ALT 253>>275 02/17/21>>72 02/25/21>>44 03/04/21, normal alk phos, bilirubin, Amylase, Lipase. S/p cholecystectomy, [place] 02/19/21 no cyst or mass.               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 F/u Ortho. TDWB. 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 Fully treated, off antibiotics.              Anemia, post op, s/p EGD no active bleed. S/p 6 u PRBC transfusion, ASA was dc'd, Hgb 10.7 02/15/21. On Fe              HTN off Amlodipine 2/2 low Bp             Morbid obesity             OSA CPAP             Chronic diarrhea, prn Imodium, Align, Welchol             RA may resume Plaquenil              Insomnia/depression/anxiety, takes Mirtazapine             GERD, stable, on Omeprazole, prn Zofran.              Edema, not apparent, takes Furosemide  CKD stage 3 Bun/creat 41/1.4 02/17/21             Hypothyroidism, takes Levothyroxine 40mg qd, TSH 8.33 02/09/21>>1.67 02/25/21<<4.15 03/23/21   Past Medical History:  Diagnosis Date   Benign hypertension with chronic kidney disease, stage III ([place]    Overview:  Last Assessment & Plan:  Usually the patient is hypertensive. However today her blood pressure is 1119systolic. She has been feeling fatigued with some lightheadedness. Part of this may be related to her lower blood pressure. Her pressure may be improved with her decrease in salt and fluid intake. I've instructed her to put her lisinopril/hctz on hold. I've asked her  to see her primary    Cardiac disease 03/10/2014   Chronic diarrhea 07/27/2015   Edema 07/27/2015   Ejection fraction 2004   Normal, echo,    Gastroesophageal reflux disease    GERD (gastroesophageal reflux disease)    Heart disease 03/10/2014   Hypertension    Left bundle branch block 09/01/2020   Obesity    Obesity (BMI 30-39.9) 09/25/2017   OSA (obstructive sleep apnea)    mild with AHI 6.75 - on CPAP   Preop cardiovascular exam 11/2010   Cardiac clearance for knee surgery    Sleep apnea 03/10/2014   Supraventricular tachycardia ([place])    Documented episode in the past, possibly reentrant tachycardia  Overview:  Overview:  Documented episode in the past, 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 It was possibly reentrant. She does well with diltiazem. No change in therapy.   SVT (supraventricular tachycardia) ([place])     Documented episode in the past, possibly reentrant tachycardia   Thyroid disease    Urinary incontinence    Past Surgical History:  Procedure Laterality Date   CATARACT EXTRACTION Left 8/11   CATARACT EXTRACTION Right 3/12   CHOLECYSTECTOMY  1994   Copsilotomy Laser Treatment Left 6/12   eye   ELBOW SURGERY  8/12   EYE SURGERY Left 09/2008   macular hole   EYE SURGERY Right 12/11   Lumbar Infusion  2011   MOLE REMOVAL  06/17/2015   TOTAL KNEE ARTHROPLASTY Left 6/11   TOTAL KNEE ARTHROPLASTY Right 06/13/2011    Allergies  Allergen Reactions   Keflex [Cephalexin] Diarrhea   Macrobid [Nitrofurantoin] Hives    Allergies as of 03/22/2021       Reactions   Keflex [cephalexin] Diarrhea   Macrobid [nitrofurantoin] Hives        Medication List        Accurate as of March 22, 2021 11:59 PM. If you have any questions, ask your nurse or doctor.          Acetaminophen 325 MG Chew Chew 650 mg by mouth every 6 (six) hours as needed for pain.   bifidobacterium infantis capsule Take 1 capsule by mouth daily.   CALCIUM PLUS VITAMIN D3 PO Take 1 tablet by mouth daily. Vitamin D3 33mg + Calcium 600   ferrous sulfate 325 (65 FE) MG EC tablet Take 325 mg by mouth. Once A Day on Mon, Wed, Fri;   furosemide 20 MG tablet Commonly known as: LASIX Take 40 mg by mouth 2 (two) times daily.   gabapentin 300 MG capsule Commonly known as: NEURONTIN Take 300 mg by mouth at bedtime.   levothyroxine 25 MCG tablet Commonly known as: SYNTHROID Take 25 mcg by mouth daily before breakfast. Take (2)tabs = 50 mcg; oral every other day   levothyroxine 25 MCG tablet Commonly known as: SYNTHROID Take 25 mcg by mouth every morning. Every other day   loperamide 2 MG capsule Commonly known as: IMODIUM Take 2 mg by mouth every 4 (four) hours as needed.   mirtazapine 15 MG tablet Commonly known as: REMERON Take 15 mg by mouth at bedtime.   omeprazole 20 MG capsule Commonly known  as: PRILOSEC Take 20 mg by mouth daily.   ondansetron 4 MG disintegrating tablet Commonly known as: ZOFRAN-ODT Take by mouth.   potassium chloride SA 20 MEQ tablet Commonly known as: KLOR-CON M Take 20 mEq by mouth daily.  traMADol 50 MG tablet Commonly known as: ULTRAM Take 50 mg by mouth every 6 (six) hours as needed.   Vitamin D (Ergocalciferol) 50 MCG (2000 UT) Caps Take by mouth daily.        Review of Systems  Constitutional:  Negative for appetite change, fatigue and fever.  HENT:  Negative for congestion and trouble swallowing.   Eyes:  Negative for visual disturbance.  Respiratory:  Negative for cough.   Cardiovascular:  Negative for leg swelling.  Gastrointestinal:  Negative for abdominal pain and constipation.  Genitourinary:  Negative for dysuria.  Musculoskeletal:  Positive for arthralgias and gait problem. Negative for joint swelling.  Skin:        Posterior [name] thigh skin breakdown.   Neurological:  Negative for speech difficulty, weakness and headaches.  Psychiatric/Behavioral:  Negative for confusion and sleep disturbance. The patient is not nervous/anxious.    Immunization History  Administered Date(s) Administered   Influenza, High Dose Seasonal PF 01/11/2017, 01/30/2018, 02/05/2020   Zoster Recombinat (Shingrix) 11/08/2016, 01/11/2017   Pertinent  Health Maintenance Due  Topic Date Due   INFLUENZA VACCINE  11/23/2020   DEXA SCAN  Completed   Fall Risk 10/24/2020  Patient Fall Risk Level Moderate fall risk   Functional Status Survey:    Vitals:   03/22/21 1527  BP: 136/70  Pulse: 74  Resp: 18  Temp: 97.7 F (36.5 C)  SpO2: 94%   There is no height or weight on file to calculate BMI. Physical Exam Vitals and nursing note reviewed.  Constitutional:      Appearance: She is obese.  HENT:     Head: Normocephalic and atraumatic.     Mouth/Throat:     Mouth: Mucous membranes are moist.  Eyes:     Extraocular Movements: Extraocular  movements intact.     Conjunctiva/sclera: Conjunctivae normal.     Pupils: Pupils are equal, round, and reactive to light.  Cardiovascular:     Rate and Rhythm: Normal rate and regular rhythm.     Heart sounds: No murmur heard. Pulmonary:     Effort: Pulmonary effort is normal.  Abdominal:     General: Bowel sounds are normal.     Palpations: Abdomen is soft.     Tenderness: There is no abdominal tenderness. There is no left CVA tenderness.  Musculoskeletal:     Cervical back: Normal range of motion and neck supple.     Right lower leg: No edema.     Left lower leg: No edema.     Comments: R hip, prosthetic hip removed.   Skin:    General: Skin is warm and dry.     Comments: Right hip surgical scar. Posterior [name] upper thighs, just below the gluteal folds, a linear skin missing open wound [name], no s/s of infection, healing.   Neurological:     General: No focal deficit present.     Mental Status: She is alert and oriented to person, place, and time.     Gait: Gait normal.  Psychiatric:        Mood and Affect: Mood normal.        Behavior: Behavior normal.        Thought Content: Thought content normal.        Judgment: Judgment normal.    Labs reviewed: Recent Labs    10/24/20 1308 02/08/21 0000 02/15/21 0000 02/17/21 0000  NA 139 140 137 135*  K 4.1 3.0* 3.9 4.6  CL 110 107 102  101  CO2 22 24* 25* 26*  GLUCOSE 96  --   --   --   BUN 15 11 31* 41*  CREATININE 0.98 1.2* 1.2* 1.4*  CALCIUM 9.2 7.5* 8.5* 9.1   Recent Labs    10/24/20 1308 02/08/21 0000 02/15/21 0000 02/17/21 0000  AST 23 36* 271* 205*  ALT 32 24 253* 275*  ALKPHOS 117  --  88 95  BILITOT 0.6  --   --   --   PROT 7.6  --   --   --   ALBUMIN 3.6 2.8* 3.1* 3.2*   Recent Labs    10/24/20 1308 02/08/21 0000 02/15/21 0000  WBC 9.3 8.2 10.4  NEUTROABS 7.0  --  5,741.00  HGB 10.6* 9.9* 10.7*  HCT 34.8* 31* 34*  MCV 83.7  --   --   PLT 430*  --  413*   Lab Results  Component Value Date    TSH 4.520 (H) 03/29/2018   No results found for: HGBA1C No results found for: CHOL, HDL, LDLCALC, LDLDIRECT, TRIG, CHOLHDL  Significant Diagnostic Results in last 30 days:  No results found.  Assessment/Plan  Benign essential HTN Still runs low, but better than prior, off Amlodipine.   OSA (obstructive sleep apnea) CPAP  Chronic diarrhea Stable,  prn Imodium, Align, Welchol  Rheumatoid arthritis ([place]) may resume Plaquenil, f/u Rheumatology.   Insomnia secondary to depression with anxiety Stable, takes Mirtazapine  Gastroesophageal reflux disease stable, on Omeprazole, prn Zofran.   Edema of lower extremity not apparent, takes Furosemide   CKD (chronic kidney disease) stage 3, GFR 30-59 ml/min (HCC) Bun/creat 41/1.4 02/17/21  Hypothyroidism takes Levothyroxine 24mg qd, TSH 8.33 02/09/21>>1.67 02/25/21<<4.15 03/23/21  Blood loss anemia post op, s/p EGD no active bleed. S/p 6 u PRBC transfusion, ASA was dc'd, Hgb 10.7 02/15/21. 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 Fully treated, off antibiotics.   Right hip pain R hip pain, s/p right THR 2018, removal of hardware 12/29/20,  takes Tylenol, Gabapentin. F/u Ortho. TDWB. Power wc  Elevated liver enzymes elevated AST 271>>205 02/17/21>>64 02/25/21>>47 03/04/21,  ALT 253>>275 02/17/21>>72 02/25/21>>44 03/04/21, normal alk phos, bilirubin, Amylase, Lipase. S/p cholecyst[place], [place] no cyst or mass.   Pressure ulcer of thigh, stage 2 (HCC) Skin breakdown [name] just below the gluteal fold [name], pressure and friction mostly likely the culprit of the small areas of skin missing.    Family/ staff Communication: plan of care reviewed with the patient and charge nurse.   Labs/tests ordered:  none  Time spend 35 minutes.

## 2021-03-22 NOTE — Assessment & Plan Note (Signed)
Stable,  takes Mirtazapine 

## 2021-03-23 ENCOUNTER — Encounter: Payer: Self-pay | Admitting: Nurse Practitioner

## 2021-03-24 LAB — TSH: TSH: 4.15 (ref 0.41–5.90)

## 2021-03-25 ENCOUNTER — Encounter: Payer: Self-pay | Admitting: Nurse Practitioner

## 2021-04-02 ENCOUNTER — Non-Acute Institutional Stay (SKILLED_NURSING_FACILITY): Payer: Medicare Other | Admitting: Nurse Practitioner

## 2021-04-02 DIAGNOSIS — N390 Urinary tract infection, site not specified: Secondary | ICD-10-CM | POA: Insufficient documentation

## 2021-04-02 DIAGNOSIS — F418 Other specified anxiety disorders: Secondary | ICD-10-CM

## 2021-04-02 DIAGNOSIS — L89202 Pressure ulcer of unspecified hip, stage 2: Secondary | ICD-10-CM | POA: Diagnosis not present

## 2021-04-02 DIAGNOSIS — I10 Essential (primary) hypertension: Secondary | ICD-10-CM

## 2021-04-02 DIAGNOSIS — K2100 Gastro-esophageal reflux disease with esophagitis, without bleeding: Secondary | ICD-10-CM

## 2021-04-02 DIAGNOSIS — N1831 Chronic kidney disease, stage 3a: Secondary | ICD-10-CM

## 2021-04-02 DIAGNOSIS — T8451XS Infection and inflammatory reaction due to internal right hip prosthesis, sequela: Secondary | ICD-10-CM

## 2021-04-02 DIAGNOSIS — M069 Rheumatoid arthritis, unspecified: Secondary | ICD-10-CM

## 2021-04-02 DIAGNOSIS — R748 Abnormal levels of other serum enzymes: Secondary | ICD-10-CM | POA: Diagnosis not present

## 2021-04-02 DIAGNOSIS — D500 Iron deficiency anemia secondary to blood loss (chronic): Secondary | ICD-10-CM

## 2021-04-02 DIAGNOSIS — M25551 Pain in right hip: Secondary | ICD-10-CM

## 2021-04-02 DIAGNOSIS — E039 Hypothyroidism, unspecified: Secondary | ICD-10-CM

## 2021-04-02 DIAGNOSIS — R300 Dysuria: Secondary | ICD-10-CM

## 2021-04-02 DIAGNOSIS — F5105 Insomnia due to other mental disorder: Secondary | ICD-10-CM

## 2021-04-02 DIAGNOSIS — R600 Localized edema: Secondary | ICD-10-CM

## 2021-04-02 NOTE — Progress Notes (Addendum)
Location:   S[facility] [place] Room Number: N056/A Place of Service:  SNF (31) Provider: [facility] Iasia Forcier NP  Cari Caraway, MD  Patient Care Team: Cari Caraway, MD as PCP - General (Family Medicine) Dorothy Spark, MD (Inactive) as PCP - Cardiology (Cardiology) Sueanne Margarita, MD as PCP - Sleep Medicine (Cardiology) Donzetta Sprung., MD (Sports Medicine) Linward Natal, MD (Ophthalmology) [name], Katharine Look, MD (Dermatology) Ronald Lobo, MD (Gastroenterology) Thereasa Distance, MD (Nephrology) Jola Baptist, DC as Referring Physician (Chiropractic Medicine)  Extended Emergency Contact Information Primary Emergency ContactCRYSTALANN, KORF Home Phone: [phone] Relation: Spouse  Code Status: DNR Goals of care: Advanced Directive information Advanced Directives 04/05/2021  Does Patient Have a Medical Advance Directive? Yes  Type of Advance Directive Living will  Does patient want to make changes to medical advance directive? No - Patient declined  Copy of [place] in Chart? -     Chief Complaint  Patient presents with  . Acute Visit    Burning sensation when urinating      HPI:  Pt is a 80 y.o. female seen today for an acute visit for c/o burning sensation upon urination, denied abd pain or blood in urine, she is afebrile.    Skin breakdown [name] just below the gluteal fold [name], pressure and friction mostly likely the culprit of the small areas of skin missing.              Elevated AST 271>>205 02/17/21>>64 02/25/21>>47 03/04/21,  ALT 253>>275 02/17/21>>72 02/25/21>>44 03/04/21, normal alk phos, bilirubin, Amylase, Lipase. S/p cholecystectomy, [place] 02/19/21 no cyst or mass.               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 F/u Ortho. TDWB. 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 Fully treated, off antibiotics.              Anemia, post op, s/p EGD no  active bleed. S/p 6 u PRBC transfusion, ASA was dc'd, Hgb 10.7 02/15/21. On Fe             HTN off Amlodipine 2/2 low Bp             Morbid obesity             OSA CPAP             Chronic diarrhea, prn Imodium, Align, Welchol             RA f/u Rheumatology for resume Plaquenil              Insomnia/depression/anxiety, takes Mirtazapine             GERD, stable, on Omeprazole, prn Zofran.              Edema, not apparent, takes Furosemide  CKD stage 3 Bun/creat 41/1.4 02/17/21             Hypothyroidism, takes Levothyroxine 13mg qd, TSH 8.33 02/09/21>>1.67 02/25/21<<4.15 03/23/21    Past Medical History:  Diagnosis Date  . Benign hypertension with chronic kidney disease, stage III (HCC)    Overview:  Last Assessment & Plan:  Usually the patient is hypertensive. However today her blood pressure is 1867systolic. She has been feeling fatigued with some lightheadedness. Part of this may be related to her lower blood pressure. Her pressure may  be improved with her decrease in salt and fluid intake. I've instructed her to put her lisinopril/hctz on hold. I've asked her to see her primary   . Cardiac disease 03/10/2014  . Chronic diarrhea 07/27/2015  . Edema 07/27/2015  . Ejection fraction 2004   Normal, echo,   . Gastroesophageal reflux disease   . GERD (gastroesophageal reflux disease)   . Heart disease 03/10/2014  . Hypertension   . Left bundle branch block 09/01/2020  . Obesity   . Obesity (BMI 30-39.9) 09/25/2017  . OSA (obstructive sleep apnea)    mild with AHI 6.75 - on CPAP  . Preop cardiovascular exam 11/2010   Cardiac clearance for knee surgery   . Sleep apnea 03/10/2014  . Supraventricular tachycardia ([place])    Documented episode in the past, possibly reentrant tachycardia  Overview:  Overview:  Documented episode in the past, 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 It was  possibly reentrant. She does well with diltiazem. No change in therapy.  . SVT (supraventricular tachycardia) ([place])    Documented episode in the past, possibly reentrant tachycardia  . Thyroid disease   . Urinary incontinence    Past Surgical History:  Procedure Laterality Date  . CATARACT EXTRACTION Left 8/11  . CATARACT EXTRACTION Right 3/12  . CHOLECYSTECTOMY  1994  . Copsilotomy Laser Treatment Left 6/12   eye  . ELBOW SURGERY  8/12  . 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 EYE SURGERY Right 12/11  . Lumbar Infusion  2011  . MOLE REMOVAL  06/17/2015  . 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 TOTAL KNEE ARTHROPLASTY Right 06/13/2011    Allergies  Allergen Reactions  . Keflex [Cephalexin] Diarrhea  . Macrobid [Nitrofurantoin] Hives    Allergies as of 04/02/2021       Reactions   Keflex [cephalexin] Diarrhea   Macrobid [nitrofurantoin] Hives        Medication List        Accurate as of April 02, 2021 11:59 PM. If you have any questions, ask your nurse or doctor.          STOP taking these medications    traMADol 50 MG tablet Commonly known as: ULTRAM       TAKE these medications    Acetaminophen 325 MG Chew Chew 650 mg by mouth every 6 (six) hours as needed for pain.   bifidobacterium infantis capsule Take 1 capsule by mouth daily.   CALCIUM PLUS VITAMIN D3 PO Take 1 tablet by mouth daily. Vitamin D3 27mg + Calcium 600   ferrous sulfate 325 (65 FE) MG EC tablet Take 325 mg by mouth. Once A Day on Mon, Wed, Fri;   furosemide 40 MG tablet Commonly known as: LASIX Take 40 mg by mouth 2 (two) times daily. What changed: Another medication with the same name was removed. Continue taking this medication, and follow the directions you see here.   gabapentin 300 MG capsule Commonly known as: NEURONTIN Take 300 mg by mouth at bedtime.   levothyroxine 25 MCG tablet Commonly known as: SYNTHROID Take 25 mcg by mouth daily before breakfast. Take  (2)tabs = 50 mcg; oral every other day   levothyroxine 25 MCG tablet Commonly known as: SYNTHROID Take 25 mcg by mouth every morning. Every other day   loperamide 2 MG capsule Commonly known as: IMODIUM Take 2 mg by mouth every 4 (  four) hours as needed.   mirtazapine 15 MG tablet Commonly known as: REMERON Take 15 mg by mouth at bedtime.   omeprazole 20 MG capsule Commonly known as: PRILOSEC Take 20 mg by mouth daily.   ondansetron 4 MG disintegrating tablet Commonly known as: ZOFRAN-ODT Take by mouth.   potassium chloride SA 20 MEQ tablet Commonly known as: KLOR-CON M Take 20 mEq by mouth daily.   Vitamin D (Ergocalciferol) 50 MCG (2000 UT) Caps Take by mouth daily.        Review of Systems  Constitutional:  Positive for unexpected weight change. Negative for appetite change, fatigue and fever.       ? #15Ibs in 5 weeks-monitor.   HENT:  Negative for congestion and trouble swallowing.   Eyes:  Negative for visual disturbance.  Respiratory:  Negative for cough.   Cardiovascular:  Negative for leg swelling.  Gastrointestinal:  Negative for abdominal pain, constipation, nausea and vomiting.  Genitourinary:  Positive for dysuria. Negative for difficulty urinating, frequency, hematuria and urgency.  Musculoskeletal:  Positive for arthralgias and gait problem. Negative for joint swelling.  Skin:        Posterior [name] thigh skin breakdown.   Neurological:  Negative for speech difficulty, weakness and headaches.  Psychiatric/Behavioral:  Negative for confusion and sleep disturbance. The patient is not nervous/anxious.    Immunization History  Administered Date(s) Administered  . Influenza, High Dose Seasonal PF 01/11/2017, 01/30/2018, 02/05/2020  . Moderna Sars-Covid-2 Vaccination 03/03/2020, 11/04/2020  . Pension scheme manager 6yr & up 02/09/2021  . Zoster Recombinat (Shingrix) 11/08/2016, 01/11/2017   Pertinent  Health Maintenance Due  Topic Date  Due  . INFLUENZA VACCINE  11/23/2020  . DEXA SCAN  Completed   Fall Risk 10/24/2020  Patient Fall Risk Level Moderate fall risk   Functional Status Survey:    Vitals:   04/02/21 1638  BP: 124/77  Pulse: 74  Temp: (!) 97 F (36.1 C)  SpO2: 98%  Weight: 239 lb 12.8 oz (108.8 kg)   Body mass index is 36.46 kg/m. Physical Exam Vitals and nursing note reviewed.  Constitutional:      Appearance: She is obese.  HENT:     Head: Normocephalic and atraumatic.     Mouth/Throat:     Mouth: Mucous membranes are moist.  Eyes:     Extraocular Movements: Extraocular movements intact.     Conjunctiva/sclera: Conjunctivae normal.     Pupils: Pupils are equal, round, and reactive to light.  Cardiovascular:     Rate and Rhythm: Normal rate and regular rhythm.     Heart sounds: No murmur heard. Pulmonary:     Effort: Pulmonary effort is normal.  Abdominal:     General: Bowel sounds are normal.     Palpations: Abdomen is soft.     Tenderness: There is no abdominal tenderness. There is no right CVA tenderness, left CVA tenderness, guarding or rebound.  Genitourinary:    Comments: No urogenital irritation or skin breakdown Musculoskeletal:     Cervical back: Normal range of motion and neck supple.     Right lower leg: No edema.     Left lower leg: No edema.     Comments: R hip, prosthetic hip removed.   Skin:    General: Skin is warm and dry.     Comments: Right hip surgical scar. From previous examination: posterior [name] upper thighs, just below the gluteal folds, a linear skin missing open wound [name]-covered in dressing during my  examination today.   Neurological:     General: No focal deficit present.     Mental Status: She is alert and oriented to person, place, and time.     Gait: Gait normal.  Psychiatric:        Mood and Affect: Mood normal.        Behavior: Behavior normal.        Thought Content: Thought content normal.        Judgment: Judgment normal.    Labs  reviewed: Recent Labs    10/24/20 1308 02/08/21 0000 02/15/21 0000 02/17/21 0000  NA 139 140 137 135*  K 4.1 3.0* 3.9 4.6  CL 110 107 102 101  CO2 22 24* 25* 26*  GLUCOSE 96  --   --   --   BUN 15 11 31* 41*  CREATININE 0.98 1.2* 1.2* 1.4*  CALCIUM 9.2 7.5* 8.5* 9.1   Recent Labs    10/24/20 1308 02/08/21 0000 02/15/21 0000 02/17/21 0000  AST 23 36* 271* 205*  ALT 32 24 253* 275*  ALKPHOS 117  --  88 95  BILITOT 0.6  --   --   --   PROT 7.6  --   --   --   ALBUMIN 3.6 2.8* 3.1* 3.2*   Recent Labs    10/24/20 1308 02/08/21 0000 02/15/21 0000  WBC 9.3 8.2 10.4  NEUTROABS 7.0  --  5,741.00  HGB 10.6* 9.9* 10.7*  HCT 34.8* 31* 34*  MCV 83.7  --   --   PLT 430*  --  413*   Lab Results  Component Value Date   TSH 4.520 (H) 03/29/2018   No results found for: HGBA1C No results found for: CHOL, HDL, LDLCALC, LDLDIRECT, TRIG, CHOLHDL  Significant Diagnostic Results in last 30 days:  No results found.  Assessment/Plan: UTI (urinary tract infection) c/o burning sensation upon urination, denied abd pain or blood in urine, she is afebrile. UA C/S in am.  04/06/21 7 day course of Cefadroxil  Pressure ulcer of thigh, stage 2 (HCC) Skin breakdown [name] just below the gluteal fold [name], pressure and friction mostly likely the culprit of the small areas of skin missing.   Elevated liver enzymes  Elevated AST 271>>205 02/17/21>>64 02/25/21>>47 03/04/21,  ALT 253>>275 02/17/21>>72 02/25/21>>44 03/04/21, normal alk phos, bilirubin, Amylase, Lipase. S/p cholecystectomy, [place] 02/19/21 no cyst or mass.   Right hip pain R hip pain, s/p right THR 2018, removal of hardware 12/29/20,  takes Tylenol, Gabapentin. F/u Ortho. TDWB. 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 Fully treated, off antibiotics.   Blood loss anemia s/p EGD no active bleed. S/p 6 u PRBC transfusion, ASA was dc'd, Hgb 10.7 02/15/21. On Fe  Benign  essential HTN Controlled, off Amlodipne.   Insomnia secondary to depression with anxiety Stable, takes Mirtazapine  Gastroesophageal reflux disease stable, on Omeprazole, prn Zofran.   Rheumatoid arthritis ([place]) F/u Rheumatology for resuming Plaquenil.   Edema of lower extremity not apparent, takes Furosemide   CKD (chronic kidney disease) stage 3, GFR 30-59 ml/min (HCC) CKD stage 3 Bun/creat 41/1.4 02/17/21  Hypothyroidism  takes Levothyroxine 3mg qd, TSH 8.33 02/09/21>>1.67 02/25/21<<4.15 03/23/21      Family/ staff Communication: plan of care reviewed with the patient and charge nurse.   Labs/tests ordered:  UA C/S  Time spend 35 minutes.

## 2021-04-02 NOTE — Assessment & Plan Note (Signed)
stable, on Omeprazole, prn Zofran.  

## 2021-04-02 NOTE — Assessment & Plan Note (Signed)
Stable,  takes Mirtazapine 

## 2021-04-02 NOTE — Assessment & Plan Note (Signed)
Controlled, off Amlodipne.

## 2021-04-02 NOTE — Assessment & Plan Note (Signed)
Elevated AST 271>>205 02/17/21>>64 02/25/21>>47 03/04/21,  ALT 253>>275 02/17/21>>72 02/25/21>>44 03/04/21, normal alk phos, bilirubin, Amylase, Lipase. S/p cholecystectomy, [place] 02/19/21 no cyst or mass.

## 2021-04-02 NOTE — Assessment & Plan Note (Signed)
CKD stage 3 Bun/creat 41/1.4 02/17/21

## 2021-04-02 NOTE — Assessment & Plan Note (Signed)
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 F/u Ortho. TDWB. Power wc

## 2021-04-02 NOTE — Assessment & Plan Note (Signed)
Skin breakdown [name] just below the gluteal fold [name], pressure and friction mostly likely the culprit of the small areas of skin missing.

## 2021-04-02 NOTE — Assessment & Plan Note (Signed)
not apparent, takes Furosemide  

## 2021-04-02 NOTE — Assessment & Plan Note (Signed)
s/p EGD no active bleed. S/p 6 u PRBC transfusion, ASA was dc'd, Hgb 10.7 02/15/21. On Fe

## 2021-04-02 NOTE — Assessment & Plan Note (Addendum)
c/o burning sensation upon urination, denied abd pain or blood in urine, she is afebrile. UA C/S in am.  04/06/21 7 day course of Cefadroxil

## 2021-04-02 NOTE — Assessment & Plan Note (Signed)
takes Levothyroxine 25mcg qd, TSH 8.33 02/09/21>>1.67 02/25/21<<4.15 03/23/21 

## 2021-04-02 NOTE — Assessment & Plan Note (Addendum)
F/u Rheumatology for resuming Plaquenil.

## 2021-04-02 NOTE — Assessment & Plan Note (Signed)
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 Fully treated, off antibiotics.

## 2021-04-05 ENCOUNTER — Encounter: Payer: Self-pay | Admitting: Nurse Practitioner

## 2021-04-06 ENCOUNTER — Encounter: Payer: Self-pay | Admitting: Nurse Practitioner

## 2021-04-12 ENCOUNTER — Encounter: Payer: Self-pay | Admitting: Nurse Practitioner

## 2021-04-12 ENCOUNTER — Non-Acute Institutional Stay (SKILLED_NURSING_FACILITY): Payer: Medicare Other | Admitting: Nurse Practitioner

## 2021-04-12 DIAGNOSIS — T8451XS Infection and inflammatory reaction due to internal right hip prosthesis, sequela: Secondary | ICD-10-CM | POA: Diagnosis not present

## 2021-04-12 DIAGNOSIS — K2100 Gastro-esophageal reflux disease with esophagitis, without bleeding: Secondary | ICD-10-CM

## 2021-04-12 DIAGNOSIS — M069 Rheumatoid arthritis, unspecified: Secondary | ICD-10-CM

## 2021-04-12 DIAGNOSIS — N1831 Chronic kidney disease, stage 3a: Secondary | ICD-10-CM

## 2021-04-12 DIAGNOSIS — I10 Essential (primary) hypertension: Secondary | ICD-10-CM

## 2021-04-12 DIAGNOSIS — B3749 Other urogenital candidiasis: Secondary | ICD-10-CM

## 2021-04-12 DIAGNOSIS — L89202 Pressure ulcer of unspecified hip, stage 2: Secondary | ICD-10-CM

## 2021-04-12 DIAGNOSIS — K529 Noninfective gastroenteritis and colitis, unspecified: Secondary | ICD-10-CM

## 2021-04-12 DIAGNOSIS — D500 Iron deficiency anemia secondary to blood loss (chronic): Secondary | ICD-10-CM

## 2021-04-12 DIAGNOSIS — F418 Other specified anxiety disorders: Secondary | ICD-10-CM

## 2021-04-12 DIAGNOSIS — R748 Abnormal levels of other serum enzymes: Secondary | ICD-10-CM | POA: Diagnosis not present

## 2021-04-12 DIAGNOSIS — F5105 Insomnia due to other mental disorder: Secondary | ICD-10-CM

## 2021-04-12 DIAGNOSIS — E039 Hypothyroidism, unspecified: Secondary | ICD-10-CM

## 2021-04-12 DIAGNOSIS — G4733 Obstructive sleep apnea (adult) (pediatric): Secondary | ICD-10-CM

## 2021-04-12 DIAGNOSIS — R600 Localized edema: Secondary | ICD-10-CM

## 2021-04-12 DIAGNOSIS — M25551 Pain in right hip: Secondary | ICD-10-CM

## 2021-04-12 NOTE — Assessment & Plan Note (Signed)
prn Imodium, Align, Welchol

## 2021-04-12 NOTE — Assessment & Plan Note (Signed)
f/u Rheumatology for resume Plaquenil

## 2021-04-12 NOTE — Assessment & Plan Note (Signed)
post op, s/p EGD no active bleed. S/p 6 u PRBC transfusion, ASA was dc'd, Hgb 10.7 02/15/21. On Fe 

## 2021-04-12 NOTE — Assessment & Plan Note (Signed)
stage 3 Bun/creat 41/1.4 02/17/21

## 2021-04-12 NOTE — Assessment & Plan Note (Signed)
Stable, continue Mirtazaline.

## 2021-04-12 NOTE — Assessment & Plan Note (Signed)
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 F/u Ortho. TDWB. Power wc 

## 2021-04-12 NOTE — Assessment & Plan Note (Signed)
stable, on Omeprazole, prn Zofran.  

## 2021-04-12 NOTE — Progress Notes (Signed)
Location:   [facility] Room Number: 54 Place of Service:  SNF (31) Provider:  Xavien Dauphinais X, NP  Cari Caraway, MD  Patient Care Team: Cari Caraway, MD as PCP - General (Family Medicine) Dorothy Spark, MD (Inactive) as PCP - Cardiology (Cardiology) Sueanne Margarita, MD as PCP - Sleep Medicine (Cardiology) Donzetta Sprung., MD (Sports Medicine) Linward Natal, MD (Ophthalmology) [name], Katharine Look, MD (Dermatology) Ronald Lobo, MD (Gastroenterology) Thereasa Distance, MD (Nephrology) Jola Baptist, DC as Referring Physician (Chiropractic Medicine)  Extended Emergency Contact Information Primary Emergency ContactSADIYA, DURAND Home Phone: [phone] Relation: Spouse  Code Status:  DNR Goals of care: Advanced Directive information Advanced Directives 04/12/2021  Does Patient Have a Medical Advance Directive? Yes  Type of Advance Directive Living will  Does patient want to make changes to medical advance directive? No - Patient declined  Copy of [place] in Chart? -     Chief Complaint  Patient presents with   Medical Management of Chronic Issues    Routine follow up visit.   Health Maintenance    Pneumonia vaccine, flu vaccine    HPI:  Pt is a 80 y.o. female seen today for medical management of chronic diseases.    UTI, asymptomatic today, complete 7 day course of Cefadroxil started 04/06/21.   Skin breakdown [name] just below the gluteal fold [name], healed.              Elevated AST 271>>205 02/17/21>>64 02/25/21>>47 03/04/21,  ALT 253>>275 02/17/21>>72 02/25/21>>44 03/04/21, normal alk phos, bilirubin, Amylase, Lipase. S/p cholecystectomy, [place] 02/19/21 no cyst or mass.               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 F/u Ortho. TDWB. 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 Fully treated, off antibiotics.              Anemia,  post op, s/p EGD no active bleed. S/p 6 u PRBC transfusion, ASA was dc'd, Hgb 10.7 02/15/21. On Fe             HTN off Amlodipine 2/2 low Bp             Morbid obesity             OSA CPAP             Chronic diarrhea, prn Imodium, Align, Welchol             RA f/u Rheumatology for resume Plaquenil              Insomnia/depression/anxiety, takes Mirtazapine             GERD, stable, on Omeprazole, prn Zofran.              Edema, not apparent, takes Furosemide  CKD stage 3 Bun/creat 41/1.4 02/17/21             Hypothyroidism, takes Levothyroxine 27mg qd, TSH 8.33 02/09/21>>1.67 02/25/21<<4.15 03/23/21      Past Medical History:  Diagnosis Date   Benign hypertension with chronic kidney disease, stage III ([place]    Overview:  Last Assessment & Plan:  Usually the patient is hypertensive. However today her blood pressure is 1563systolic. She has been feeling fatigued with some lightheadedness. Part of this may be related to her lower blood  pressure. Her pressure may be improved with her decrease in salt and fluid intake. I've instructed her to put her lisinopril/hctz on hold. I've asked her to see her primary    Cardiac disease 03/10/2014   Chronic diarrhea 07/27/2015   Edema 07/27/2015   Ejection fraction 2004   Normal, echo,    Gastroesophageal reflux disease    GERD (gastroesophageal reflux disease)    Heart disease 03/10/2014   Hypertension    Left bundle branch block 09/01/2020   Obesity    Obesity (BMI 30-39.9) 09/25/2017   OSA (obstructive sleep apnea)    mild with AHI 6.75 - on CPAP   Preop cardiovascular exam 11/2010   Cardiac clearance for knee surgery    Sleep apnea 03/10/2014   Supraventricular tachycardia ([place])    Documented episode in the past, possibly reentrant tachycardia  Overview:  Overview:  Documented episode in the past, 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 It  was possibly reentrant. She does well with diltiazem. No change in therapy.   SVT (supraventricular tachycardia) ([place])    Documented episode in the past, possibly reentrant tachycardia   Thyroid disease    Urinary incontinence    Past Surgical History:  Procedure Laterality Date   CATARACT EXTRACTION Left 8/11   CATARACT EXTRACTION Right 3/12   CHOLECYSTECTOMY  1994   Copsilotomy Laser Treatment Left 6/12   eye   ELBOW SURGERY  8/12   EYE SURGERY Left 09/2008   macular hole   EYE SURGERY Right 12/11   Lumbar Infusion  2011   MOLE REMOVAL  06/17/2015   TOTAL KNEE ARTHROPLASTY Left 6/11   TOTAL KNEE ARTHROPLASTY Right 06/13/2011    Allergies  Allergen Reactions   Keflex [Cephalexin] Diarrhea   Macrobid [Nitrofurantoin] Hives    Allergies as of 04/12/2021       Reactions   Keflex [cephalexin] Diarrhea   Macrobid [nitrofurantoin] Hives        Medication List        Accurate as of April 12, 2021 11:59 PM. If you have any questions, ask your nurse or doctor.          Acetaminophen 325 MG Chew Chew 650 mg by mouth every 6 (six) hours as needed for pain.   bifidobacterium infantis capsule Take 1 capsule by mouth daily.   CALCIUM PLUS VITAMIN D3 PO Take 1 tablet by mouth daily. Vitamin D3 11mg + Calcium 600   cefadroxil 500 MG capsule Commonly known as: DURICEF Take 500 mg by mouth 2 (two) times daily.   ferrous sulfate 325 (65 FE) MG EC tablet Take 325 mg by mouth. Once A Day on Mon, Wed, Fri;   furosemide 40 MG tablet Commonly known as: LASIX Take 40 mg by mouth 2 (two) times daily.   gabapentin 300 MG capsule Commonly known as: NEURONTIN Take 300 mg by mouth at bedtime.   levothyroxine 25 MCG tablet Commonly known as: SYNTHROID Take 25 mcg by mouth daily before breakfast. Take (2)tabs = 50 mcg; oral every other day   levothyroxine 25 MCG tablet Commonly known as: SYNTHROID Take 25 mcg by mouth every morning. Every other day   loperamide 2  MG capsule Commonly known as: IMODIUM Take 2 mg by mouth every 4 (four) hours as needed.   mirtazapine 15 MG tablet Commonly known as: REMERON Take 15 mg by mouth at bedtime.   omeprazole 20  MG capsule Commonly known as: PRILOSEC Take 20 mg by mouth daily.   ondansetron 4 MG disintegrating tablet Commonly known as: ZOFRAN-ODT Take by mouth.   potassium chloride SA 20 MEQ tablet Commonly known as: KLOR-CON M Take 40 mEq by mouth daily.   Vitamin D (Ergocalciferol) 50 MCG (2000 UT) Caps Take by mouth daily.        Review of Systems  Constitutional:  Negative for fatigue, fever and unexpected weight change.       ? #15Ibs in 5 weeks-monitor.   HENT:  Negative for congestion and trouble swallowing.   Eyes:  Negative for visual disturbance.  Respiratory:  Negative for cough.   Cardiovascular:  Negative for leg swelling.  Gastrointestinal:  Negative for abdominal pain and constipation.  Genitourinary:  Negative for dysuria, frequency and urgency.  Musculoskeletal:  Positive for arthralgias and gait problem.  Skin:        Red, irritated area in urogenital/inner thighs.   Neurological:  Negative for speech difficulty, weakness and headaches.  Psychiatric/Behavioral:  Negative for confusion and sleep disturbance. The patient is not nervous/anxious.    Immunization History  Administered Date(s) Administered   Influenza Split 01/28/2008, 02/17/2009, 02/20/2012, 02/11/2013, 02/02/2016, 01/11/2017, 02/04/2019   Influenza, High Dose Seasonal PF 02/04/2015, 01/11/2017, 01/30/2018, 02/05/2020   Moderna Sars-Covid-2 Vaccination 05/09/2019, 05/27/2019, 03/03/2020, 09/04/2020, 11/04/2020   Pfizer Covid-19 Vaccine Bivalent Booster 6yr & up 02/09/2021   Pneumococcal Conjugate-13 08/30/2013   Pneumococcal Polysaccharide-23 11/03/2005   Td 08/01/2001   Tdap 09/22/2010, 11/24/2020   Zoster Recombinat (Shingrix) 11/08/2016, 01/11/2017   Zoster, Live 11/04/2005, 11/08/2016, 01/11/2017    Pertinent  Health Maintenance Due  Topic Date Due   INFLUENZA VACCINE  11/23/2020   DEXA SCAN  Completed   Fall Risk 10/24/2020  Patient Fall Risk Level Moderate fall risk   Functional Status Survey:    Vitals:   04/12/21 1509  BP: 133/60  Pulse: 76  Resp: 18  Temp: 97.6 F (36.4 C)  SpO2: 95%  Weight: 239 lb 12.8 oz (108.8 kg)  Height: '5\' 8"'  (1.727 m)   Body mass index is 36.46 kg/m. Physical Exam Vitals and nursing note reviewed.  Constitutional:      Appearance: She is obese.  HENT:     Head: Normocephalic and atraumatic.     Mouth/Throat:     Mouth: Mucous membranes are moist.  Eyes:     Extraocular Movements: Extraocular movements intact.     Conjunctiva/sclera: Conjunctivae normal.     Pupils: Pupils are equal, round, and reactive to light.  Cardiovascular:     Rate and Rhythm: Normal rate and regular rhythm.     Heart sounds: No murmur heard. Pulmonary:     Effort: Pulmonary effort is normal.  Abdominal:     General: Bowel sounds are normal.     Palpations: Abdomen is soft.     Tenderness: There is no abdominal tenderness. There is no right CVA tenderness, left CVA tenderness, guarding or rebound.  Genitourinary:    Comments: No urogenital irritation or skin breakdown Musculoskeletal:     Cervical back: Normal range of motion and neck supple.     Right lower leg: No edema.     Left lower leg: No edema.     Comments: R hip, prosthetic hip removed.   Skin:    General: Skin is warm and dry.     Findings: Erythema present.     Comments: Right hip surgical scar. From previous examination: posterior [name]-healed, mild  irritated redness in urogenital/inner thighs.   Neurological:     General: No focal deficit present.     Mental Status: She is alert and oriented to person, place, and time.     Gait: Gait normal.  Psychiatric:        Mood and Affect: Mood normal.        Behavior: Behavior normal.        Thought Content: Thought content normal.         Judgment: Judgment normal.    Labs reviewed: Recent Labs    10/24/20 1308 02/08/21 0000 02/15/21 0000 02/17/21 0000  NA 139 140 137 135*  K 4.1 3.0* 3.9 4.6  CL 110 107 102 101  CO2 22 24* 25* 26*  GLUCOSE 96  --   --   --   BUN 15 11 31* 41*  CREATININE 0.98 1.2* 1.2* 1.4*  CALCIUM 9.2 7.5* 8.5* 9.1   Recent Labs    10/24/20 1308 02/08/21 0000 02/15/21 0000 02/17/21 0000  AST 23 36* 271* 205*  ALT 32 24 253* 275*  ALKPHOS 117  --  88 95  BILITOT 0.6  --   --   --   PROT 7.6  --   --   --   ALBUMIN 3.6 2.8* 3.1* 3.2*   Recent Labs    10/24/20 1308 02/08/21 0000 02/15/21 0000  WBC 9.3 8.2 10.4  NEUTROABS 7.0  --  5,741.00  HGB 10.6* 9.9* 10.7*  HCT 34.8* 31* 34*  MCV 83.7  --   --   PLT 430*  --  413*   Lab Results  Component Value Date   TSH 4.520 (H) 03/29/2018   No results found for: HGBA1C No results found for: CHOL, HDL, LDLCALC, LDLDIRECT, TRIG, CHOLHDL  Significant Diagnostic Results in last 30 days:  No results found.  Assessment/Plan Pressure ulcer of thigh, stage 2 (HCC) Skin breakdown [name] just below the gluteal fold [name], healed.   Elevated liver enzymes Elevated AST 271>>205 02/17/21>>64 02/25/21>>47 03/04/21,  ALT 253>>275 02/17/21>>72 02/25/21>>44 03/04/21, normal alk phos, bilirubin, Amylase, Lipase. S/p cholecystectomy, [place] 02/19/21 no cyst or mass.   Right hip pain R hip pain, s/p right THR 2018, removal of hardware 12/29/20,  takes Tylenol, Gabapentin. F/u Ortho. TDWB. 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 Fully treated, off antibiotics.   Blood loss anemia post op, s/p EGD no active bleed. S/p 6 u PRBC transfusion, ASA was dc'd, Hgb 10.7 02/15/21. On Fe  Benign essential HTN Blood pressure is controlled, off Amlodipine.   OSA (obstructive sleep apnea) CPAP  Chronic diarrhea prn Imodium, Align, Welchol  Rheumatoid arthritis ([place]) f/u Rheumatology for resume  Plaquenil   Insomnia secondary to depression with anxiety Stable, continue Mirtazaline.   Gastroesophageal reflux disease stable, on Omeprazole, prn Zofran.   Edema of lower extremity not apparent, takes Furosemide   CKD (chronic kidney disease) stage 3, GFR 30-59 ml/min (HCC) stage 3 Bun/creat 41/1.4 02/17/21  Hypothyroidism  takes Levothyroxine 46mg qd, TSH 8.33 02/09/21>>1.67 02/25/21<<4.15 03/23/21  Candidiasis of urogenital site Irritation urogenital, inner thighs, redness, urine irritates the ares especially inner thighs. Will apply Nystatin powder bid, assist the patient with personal hygiene, encourage less adult depends use.      Family/ staff Communication: plan of care reviewed with the patient and charge nurse.   Labs/tests ordered:  none  Time spend 35 minutes.

## 2021-04-12 NOTE — Assessment & Plan Note (Signed)
takes Levothyroxine 25mcg qd, TSH 8.33 02/09/21>>1.67 02/25/21<<4.15 03/23/21 

## 2021-04-12 NOTE — Assessment & Plan Note (Signed)
not apparent, takes Furosemide  

## 2021-04-12 NOTE — Assessment & Plan Note (Addendum)
Skin breakdown [name] just below the gluteal fold [name], healed.

## 2021-04-12 NOTE — Assessment & Plan Note (Signed)
CPAP.  

## 2021-04-12 NOTE — Assessment & Plan Note (Signed)
Blood pressure is controlled, off Amlodipine.  

## 2021-04-12 NOTE — Assessment & Plan Note (Signed)
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 Fully treated, off antibiotics.  

## 2021-04-12 NOTE — Assessment & Plan Note (Signed)
Elevated AST 271>>205 02/17/21>>64 02/25/21>>47 03/04/21,  ALT 253>>275 02/17/21>>72 02/25/21>>44 03/04/21, normal alk phos, bilirubin, Amylase, Lipase. S/p cholecystectomy, [place] 02/19/21 no cyst or mass.  

## 2021-04-13 DIAGNOSIS — B372 Candidiasis of skin and nail: Secondary | ICD-10-CM | POA: Insufficient documentation

## 2021-04-13 DIAGNOSIS — B3749 Other urogenital candidiasis: Secondary | ICD-10-CM | POA: Insufficient documentation

## 2021-04-13 NOTE — Assessment & Plan Note (Signed)
Irritation urogenital, inner thighs, redness, urine irritates the ares especially inner thighs. Will apply Nystatin powder bid, assist the patient with personal hygiene, encourage less adult depends use.

## 2021-05-25 ENCOUNTER — Encounter: Payer: Self-pay | Admitting: Internal Medicine

## 2021-05-25 NOTE — Progress Notes (Signed)
Location:   [facility] Room Number: 56 Place of Service:  SNF [id number]) Provider:  Einar Crow MD  [name], MD  Patient Care Team: [name], MD as PCP - General (Family Medicine) [name], MD as PCP - Cardiology (Cardiology) [name], MD as PCP - Sleep Medicine (Cardiology) [name], MD (Sports Medicine) [name], MD (Ophthalmology) [name], [name], MD (Dermatology) [name], MD (Gastroenterology) [name], MD (Nephrology) [name], DC as Referring Physician (Chiropractic Medicine)  Extended Emergency Contact Information Primary Emergency ContactASPASIA, RUDE Home Phone: [phone] Relation: Spouse  Code Status:  DNR Managed Care Goals of care: Advanced Directive information Advanced Directives 05/25/2021  Does Patient Have a Medical Advance Directive? Yes  Type of Advance Directive Living will  Does patient want to make changes to medical advance directive? No - Patient declined  Copy of Healthcare Power of Attorney in Chart? -     Chief Complaint  Patient presents with   Medical Management of Chronic Issues   Quality Metric Gaps    Flu shot due [facility] verified   Error    HPI:  Pt is a 81 y.o. female seen today for medical management of chronic diseases.     Past Medical History:  Diagnosis Date   Benign hypertension with chronic kidney disease, stage III (HCC)    Overview:  Last Assessment & Plan:  Usually the patient is hypertensive. However today her blood pressure is 105 systolic. She has been feeling fatigued with some lightheadedness. Part of this may be related to her lower blood pressure. Her pressure may be improved with her decrease in salt and fluid intake. I've instructed her to put her lisinopril/hctz on hold. I've asked her to see her primary    Cardiac disease 03/10/2014   Chronic diarrhea 07/27/2015   Edema 07/27/2015   Ejection  fraction 2004   Normal, echo,    Gastroesophageal reflux disease    GERD (gastroesophageal reflux disease)    Heart disease 03/10/2014   Hypertension    Left bundle branch block 09/01/2020   Obesity    Obesity (BMI 30-39.9) 09/25/2017   OSA (obstructive sleep apnea)    mild with AHI 6.75 - on CPAP   Preop cardiovascular exam 11/2010   Cardiac clearance for knee surgery    Sleep apnea 03/10/2014   Supraventricular tachycardia (HCC)    Documented episode in the past, possibly reentrant tachycardia  Overview:  Overview:  Documented episode in the past, 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 It was possibly reentrant. She does well with diltiazem. No change in therapy.   SVT (supraventricular tachycardia) (HCC)    Documented episode in the past, possibly reentrant tachycardia   Thyroid disease    Urinary incontinence    Past Surgical History:  Procedure Laterality Date   CATARACT EXTRACTION Left 8/11   CATARACT EXTRACTION Right 3/12   CHOLECYSTECTOMY  1994   Copsilotomy Laser Treatment Left 6/12   eye   ELBOW SURGERY  8/12   EYE SURGERY Left 09/2008   macular hole   EYE SURGERY Right 12/11   Lumbar Infusion  2011   MOLE REMOVAL  06/17/2015   TOTAL KNEE ARTHROPLASTY Left 6/11   TOTAL KNEE ARTHROPLASTY Right 06/13/2011    Allergies  Allergen Reactions   Keflex [Cephalexin] Diarrhea   Macrobid [Nitrofurantoin] Hives    Allergies as  of 05/25/2021      Reactions   Keflex [cephalexin] Diarrhea   Macrobid [nitrofurantoin] Hives      Medication List       Accurate as of May 25, 2021 11:59 PM. If you have any questions, ask your nurse or doctor.        STOP taking these medications   cefadroxil 500 MG capsule Commonly known as: DURICEF Stopped by: Mahlon GammonAnjali L Gupta, MD     TAKE these medications   Acetaminophen 325 MG Chew Chew 650 mg by  mouth every 6 (six) hours as needed for pain.   bifidobacterium infantis capsule Take 1 capsule by mouth daily.   CALCIUM PLUS VITAMIN D3 PO Take 1 tablet by mouth daily. Vitamin D3 20mcg + Calcium 600   ferrous sulfate 325 (65 FE) MG EC tablet Take 325 mg by mouth. Once A Day on Mon, Wed, Fri;   furosemide 40 MG tablet Commonly known as: LASIX Take 40 mg by mouth 2 (two) times daily.   gabapentin 300 MG capsule Commonly known as: NEURONTIN Take 300 mg by mouth at bedtime.   levothyroxine 25 MCG tablet Commonly known as: SYNTHROID Take 25 mcg by mouth daily before breakfast. Take (2)tabs = 50 mcg; oral every other day   levothyroxine 25 MCG tablet Commonly known as: SYNTHROID Take 25 mcg by mouth every morning. Every other day   loperamide 2 MG capsule Commonly known as: IMODIUM Take 2 mg by mouth every 4 (four) hours as needed.   mirtazapine 15 MG tablet Commonly known as: REMERON Take 15 mg by mouth at bedtime.   nystatin powder Generic drug: nystatin Apply 1 application topically 2 (two) times daily. To genital areas and inner thigh   omeprazole 20 MG capsule Commonly known as: PRILOSEC Take 20 mg by mouth daily.   ondansetron 4 MG disintegrating tablet Commonly known as: ZOFRAN-ODT Take by mouth.   potassium chloride SA 20 MEQ tablet Commonly known as: KLOR-CON M Take 40 mEq by mouth daily.   Vitamin D (Ergocalciferol) 50 MCG (2000 UT) Caps Take by mouth daily.       Review of Systems  Immunization History  Administered Date(s) Administered   Influenza Split 01/28/2008, 02/17/2009, 02/20/2012, 02/11/2013, 02/02/2016, 01/11/2017, 02/04/2019   Influenza, High Dose Seasonal PF 02/04/2015, 01/11/2017, 01/30/2018, 02/05/2020   Moderna Sars-Covid-2 Vaccination 05/09/2019, 05/27/2019, 03/03/2020, 09/04/2020, 11/04/2020   Pfizer Covid-19 Vaccine Bivalent Booster 4974yrs & up 02/09/2021   Pneumococcal Conjugate-13 08/30/2013   Pneumococcal  Polysaccharide-23 11/03/2005   Td 08/01/2001   Tdap 09/22/2010, 11/24/2020   Zoster Recombinat (Shingrix) 11/08/2016, 01/11/2017   Zoster, Live 11/04/2005, 11/08/2016, 01/11/2017   Pertinent  Health Maintenance Due  Topic Date Due   INFLUENZA VACCINE  11/23/2020   DEXA SCAN  Completed   Fall Risk 10/24/2020  Patient Fall Risk Level Moderate fall risk   Functional Status Survey:    Vitals:   05/25/21 1229  BP: 121/74  Pulse: 70  Resp: 18  Temp: (!) 97.5 F (36.4 C)  SpO2: 97%  Weight: 245 lb 12.8 oz (111.5 kg)  Height: 5\' 8"  (1.727 m)   Body mass index is 37.37 kg/m. Physical Exam  Labs reviewed: Recent Labs    10/24/20 1308 02/08/21 0000 02/17/21 0000 02/18/21 0000 02/25/21 0000 03/05/21 0000  NA 139   < > 135* 135* 137 137  K 4.1   < > 4.6 4.6 3.2* 4.0  CL 110   < > 101 101 102 104  CO2  22   < > 26* 26* 26*  --   GLUCOSE 96  --   --   --   --   --   BUN 15   < > 41* 41* 23* 22*  CREATININE 0.98   < > 1.4* 1.4* 1.2* 1.1  CALCIUM 9.2   < > 9.1 9.1 8.9 8.5*   < > = values in this interval not displayed.    Recent Labs    10/24/20 1308 02/08/21 0000 02/17/21 0000 02/25/21 0000 03/05/21 0000  AST 23   < > 205* 64* 44*  ALT 32   < > 275* 72* 47*  ALKPHOS 117   < > 95 85 80  BILITOT 0.6  --   --   --   --   PROT 7.6  --   --   --   --   ALBUMIN 3.6   < > 3.2* 3.0* 2.7*   < > = values in this interval not displayed.    Recent Labs    10/24/20 1308 02/08/21 0000 02/15/21 0000  WBC 9.3 8.2 10.4  NEUTROABS 7.0  --  5,741.00  HGB 10.6* 9.9* 10.7*  HCT 34.8* 31* 34*  MCV 83.7  --   --   PLT 430*  --  413*    Lab Results  Component Value Date   TSH 4.15 03/24/2021   No results found for: HGBA1C No results found for: CHOL, HDL, LDLCALC, LDLDIRECT, TRIG, CHOLHDL  Significant Diagnostic Results in last 30 days:  No results found.  Assessment/Plan There are no diagnoses linked to this encounter.   Family/ staff Communication:    Labs/tests ordered:      This encounter was created in error - please disregard.

## 2021-05-26 LAB — POCT ERYTHROCYTE SEDIMENTATION RATE, NON-AUTOMATED: Sed Rate: 19

## 2021-06-01 ENCOUNTER — Non-Acute Institutional Stay (SKILLED_NURSING_FACILITY): Payer: Medicare Other | Admitting: Internal Medicine

## 2021-06-01 ENCOUNTER — Encounter: Payer: Self-pay | Admitting: Internal Medicine

## 2021-06-01 DIAGNOSIS — R600 Localized edema: Secondary | ICD-10-CM

## 2021-06-01 DIAGNOSIS — G4733 Obstructive sleep apnea (adult) (pediatric): Secondary | ICD-10-CM | POA: Diagnosis not present

## 2021-06-01 DIAGNOSIS — D649 Anemia, unspecified: Secondary | ICD-10-CM

## 2021-06-01 DIAGNOSIS — E039 Hypothyroidism, unspecified: Secondary | ICD-10-CM

## 2021-06-01 DIAGNOSIS — T8459XS Infection and inflammatory reaction due to other internal joint prosthesis, sequela: Secondary | ICD-10-CM

## 2021-06-01 DIAGNOSIS — B3749 Other urogenital candidiasis: Secondary | ICD-10-CM

## 2021-06-01 DIAGNOSIS — I10 Essential (primary) hypertension: Secondary | ICD-10-CM | POA: Diagnosis not present

## 2021-06-01 DIAGNOSIS — N1831 Chronic kidney disease, stage 3a: Secondary | ICD-10-CM

## 2021-06-01 DIAGNOSIS — Z96649 Presence of unspecified artificial hip joint: Secondary | ICD-10-CM

## 2021-06-01 DIAGNOSIS — M069 Rheumatoid arthritis, unspecified: Secondary | ICD-10-CM

## 2021-06-01 LAB — C-REACTIVE PROTEIN: CRP: 4.5

## 2021-06-01 NOTE — Progress Notes (Signed)
Location:   [facility] Room Number: [id number] Place of Service:  SNF [phone]) Provider:  Einar Crow MD  [name], MD  Patient Care Team: [name], MD as PCP - General (Family Medicine) [name], MD as PCP - Cardiology (Cardiology) [name], MD as PCP - Sleep Medicine (Cardiology) [name], MD (Sports Medicine) [name], MD (Ophthalmology) [name], [name], MD (Dermatology) [name], MD (Gastroenterology) [name], MD (Nephrology) [name], DC as Referring Physician (Chiropractic Medicine)  Extended Emergency Contact Information Primary Emergency ContactHADAS, [name] Home Phone: [phone] Relation: Spouse  Code Status:  DNR Managed Care Goals of care: Advanced Directive information Advanced Directives 06/01/2021  Does Patient Have a Medical Advance Directive? Yes  Type of Advance Directive Living will  Does patient want to make changes to medical advance directive? No - Patient declined  Copy of Healthcare Power of Attorney in Chart? -     Chief Complaint  Patient presents with   Medical Management of Chronic Issues   Quality Metric Gaps    Flu shot needed [facility] verified    HPI:  Pt is a 81 y.o. [name] seen today for medical management of chronic diseases.    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  Per ortho she is not good candidate for New Prosthesis reimplantation She is seeing Dr Charlann Boxer who is planning  for CT scan of joint to evaluate further  Patient has no pain or fever Her issues continues to be inability to walks Therapy helping her with transfers. She is not doing it without the Hoyrer Uses power chair well Her only complain was rash in Groin area   Past Medical History:  Diagnosis Date   Benign hypertension with chronic kidney disease, stage III (HCC)    Overview:  Last Assessment & Plan:  Usually the patient is hypertensive. However today her blood pressure is 105 systolic. She has been feeling fatigued with some lightheadedness. Part of this may be related to her lower blood pressure. Her pressure may be improved with her decrease in salt and fluid intake. I've instructed her to put her lisinopril/hctz on hold. I've asked her to see her primary    Cardiac disease 03/10/2014   Chronic diarrhea 07/27/2015   Edema 07/27/2015   Ejection fraction 2004   Normal, echo,    Gastroesophageal reflux disease    GERD (gastroesophageal reflux disease)    Heart disease 03/10/2014   Hypertension    Left bundle branch block 09/01/2020   Obesity    Obesity (BMI 30-39.9) 09/25/2017   OSA (obstructive sleep apnea)    mild with AHI 6.75 - on CPAP   Preop cardiovascular exam 11/2010   Cardiac clearance for knee surgery    Sleep apnea 03/10/2014   Supraventricular tachycardia (HCC)    Documented episode in the past, possibly reentrant tachycardia  Overview:  Overview:  Documented episode in the past, possibly reentrant tachycardia  Last Assessment & Plan:  The patient had a documented episode in  the past of a supraventricular tachycardia. It was possibly reentrant. She does well with diltiazem. No change in therapy.   SVT (supraventricular tachycardia) (HCC)    Documented episode in the past, possibly reentrant tachycardia   Thyroid disease    Urinary incontinence    Past Surgical History:  Procedure Laterality Date   CATARACT EXTRACTION Left 8/11   CATARACT EXTRACTION Right 3/12    CHOLECYSTECTOMY  1994   Copsilotomy Laser Treatment Left 6/12   eye   ELBOW SURGERY  8/12   EYE SURGERY Left 09/2008   macular hole   EYE SURGERY Right 12/11   Lumbar Infusion  2011   MOLE REMOVAL  06/17/2015   TOTAL KNEE ARTHROPLASTY Left 6/11   TOTAL KNEE ARTHROPLASTY Right 06/13/2011    Allergies  Allergen Reactions   Keflex [Cephalexin] Diarrhea   Macrobid [Nitrofurantoin] Hives    Allergies as of 06/01/2021       Reactions   Keflex [cephalexin] Diarrhea   Macrobid [nitrofurantoin] Hives        Medication List        Accurate as of June 01, 2021 11:59 PM. If you have any questions, ask your nurse or doctor.          Acetaminophen 325 MG Chew Chew 650 mg by mouth every 6 (six) hours as needed for pain.   bifidobacterium infantis capsule Take 1 capsule by mouth daily.   CALCIUM PLUS VITAMIN D3 PO Take 1 tablet by mouth daily. Vitamin D3 + Calcium 600   ferrous sulfate 325 (65 FE) MG EC tablet Take 325 mg by mouth. Once A Day on Mon, Wed, Fri;   furosemide 40 MG tablet Commonly known as: LASIX Take 40 mg by mouth 2 (two) times daily.   gabapentin 300 MG capsule Commonly known as: NEURONTIN Take 300 mg by mouth at bedtime.   levothyroxine 25 MCG tablet Commonly known as: SYNTHROID Take 25 mcg by mouth daily before breakfast. Take (2)tabs = 50 mcg; oral every other day   levothyroxine 25 MCG tablet Commonly known as: SYNTHROID Take 25 mcg by mouth every morning. Every other day   loperamide 2 MG capsule Commonly known as: IMODIUM Take 2 mg by mouth every 4 (four) hours as needed.   mirtazapine 15 MG tablet Commonly known as: REMERON Take 15 mg by mouth at bedtime.   nystatin powder Commonly known as: MYCOSTATIN/NYSTOP Apply 1 application topically 2 (two) times daily. To genital areas and inner thigh   omeprazole 20 MG capsule Commonly known as: PRILOSEC Take 20 mg by mouth daily.   ondansetron 4 MG disintegrating tablet Commonly  known as: ZOFRAN-ODT Take by mouth.   potassium chloride SA 20 MEQ tablet Commonly known as: KLOR-CON M Take 40 mEq by mouth daily.   Vitamin D (Ergocalciferol) 50 MCG (2000 UT) Caps Take by mouth daily.        Review of Systems  Constitutional:  Positive for activity change. Negative for appetite change.  HENT: Negative.    Respiratory:  Negative for cough and shortness of breath.   Cardiovascular:  Negative for leg swelling.  Gastrointestinal:  Negative for constipation.  Genitourinary: Negative.   Musculoskeletal:  Positive for gait problem. Negative for arthralgias and myalgias.  Skin:  Positive for rash.  Neurological:  Negative for dizziness and weakness.  Psychiatric/Behavioral:  Negative for confusion, dysphoric mood and sleep disturbance.    Immunization History  Administered Date(s) Administered   Influenza Split 01/28/2008, 02/17/2009, 02/20/2012, 02/11/2013, 02/02/2016,  01/11/2017, 02/04/2019   Influenza, High Dose Seasonal PF 02/04/2015, 01/11/2017, 01/30/2018, 02/05/2020   Moderna Sars-Covid-2 Vaccination 05/09/2019, 05/27/2019, 03/03/2020, 09/04/2020, 11/04/2020   Pfizer Covid-19 Vaccine Bivalent Booster 5041yrs & up 02/09/2021   Pneumococcal Conjugate-13 08/30/2013   Pneumococcal Polysaccharide-23 11/03/2005   Td 08/01/2001   Tdap 09/22/2010, 11/24/2020   Zoster Recombinat (Shingrix) 11/08/2016, 01/11/2017   Zoster, Live 11/04/2005, 11/08/2016, 01/11/2017   Zoster, Unspecified 01/11/2017   Pertinent  Health Maintenance Due  Topic Date Due   INFLUENZA VACCINE  11/23/2020   DEXA SCAN  Completed   Fall Risk 10/24/2020  Patient Fall Risk Level Moderate fall risk   Functional Status Survey:    Vitals:   06/01/21 1341  BP: 140/66  Pulse: 74  Resp: 18  Temp: (!) 96.7 F (35.9 C)  SpO2: 97%  Weight: 248 lb 6.4 oz (112.7 kg)  Height: 5\' 8"  (1.727 m)   Body mass index is 37.77 kg/m. Physical Exam Vitals reviewed.  Constitutional:      Appearance:  Normal appearance. She is obese.  HENT:     Head: Normocephalic.     Nose: Nose normal.     Mouth/Throat:     Mouth: Mucous membranes are moist.     Pharynx: Oropharynx is clear.  Eyes:     Pupils: Pupils are equal, round, and reactive to light.  Cardiovascular:     Rate and Rhythm: Normal rate and regular rhythm.     Pulses: Normal pulses.     Heart sounds: Normal heart sounds. No murmur heard. Pulmonary:     Effort: Pulmonary effort is normal.     Breath sounds: Normal breath sounds.  Abdominal:     General: Abdomen is flat. Bowel sounds are normal.     Palpations: Abdomen is soft.  Genitourinary:    Comments: Has Candidal Rash in groin area Musculoskeletal:        General: No swelling.     Cervical back: Neck supple.  Skin:    General: Skin is warm.  Neurological:     General: No focal deficit present.     Mental Status: She is alert and oriented to person, place, and time.  Psychiatric:        Mood and Affect: Mood normal.        Thought Content: Thought content normal.    Labs reviewed: Recent Labs    10/24/20 1308 02/08/21 0000 02/17/21 0000 10/[place]/22 0000 02/25/21 0000 03/05/21 0000  NA 139   < > 135* 135* 137 137  K 4.1   < > 4.6 4.6 3.2* 4.0  CL 110   < > 101 101 102 104  CO2 22   < > 26* 26* 26*  --   GLUCOSE 96  --   --   --   --   --   BUN 15   < > 41* 41* 23* 22*  CREATININE 0.98   < > 1.4* 1.4* 1.2* 1.1  CALCIUM 9.2   < > 9.1 9.1 8.9 8.5*   < > = values in this interval not displayed.   Recent Labs    10/24/20 1308 02/08/21 0000 02/17/21 0000 02/25/21 0000 03/05/21 0000  AST 23   < > 205* 64* 44*  ALT 32   < > 275* 72* 47*  ALKPHOS 117   < > 95 85 80  BILITOT 0.6  --   --   --   --   PROT 7.6  --   --   --   --  ALBUMIN 3.6   < > 3.2* 3.0* 2.7*   < > = values in this interval not displayed.   Recent Labs    10/24/20 1308 02/08/21 0000 02/15/21 0000  WBC 9.3 8.2 10.4  NEUTROABS 7.0  --  5,741.00  HGB 10.6* 9.9* 10.7*  HCT 34.8*  31* 34*  MCV 83.7  --   --   PLT 430*  --  413*   Lab Results  Component Value Date   TSH 4.15 03/24/2021   No results found for: HGBA1C No results found for: CHOL, HDL, LDLCALC, LDLDIRECT, TRIG, CHOLHDL  Significant Diagnostic Results in last 30 days:  No results found.  Assessment/Plan Candidiasis of urogenital site Diflucan 150 mg one time and then 100 mg for 3 days Continue Nystatin  Benign essential HTN Off Norvasc now Only on Lasix OSA (obstructive sleep apnea)  Hypothyroidism, unspecified type TSH normal in 11/22 Edema of lower extremity Low Dose of Lasix Rheumatoid arthritis involving multiple sites,  Has been on Plaquenil before No Active symptoms right now  Infection of prosthetic hip joint,  Has follow up with Ortho to see if candidate for Reimplantation  Stage 3a chronic kidney disease (HCC) Creat stable Anemia, unspecified type Hgb stable On Iron GERD On Prilosec Depression and Insomnia On Remeron Family/ staff Communication:   Labs/tests ordered:

## 2021-06-02 ENCOUNTER — Other Ambulatory Visit: Payer: Self-pay | Admitting: Orthopedic Surgery

## 2021-06-02 DIAGNOSIS — M25551 Pain in right hip: Secondary | ICD-10-CM

## 2021-06-03 ENCOUNTER — Other Ambulatory Visit (HOSPITAL_COMMUNITY): Payer: Self-pay | Admitting: Orthopedic Surgery

## 2021-06-03 DIAGNOSIS — M25551 Pain in right hip: Secondary | ICD-10-CM

## 2021-06-08 ENCOUNTER — Ambulatory Visit (HOSPITAL_COMMUNITY)
Admission: RE | Admit: 2021-06-08 | Discharge: 2021-06-08 | Disposition: A | Discharge status: Home / Self Care | Payer: Medicare Other | Source: Ambulatory Visit | Attending: Orthopedic Surgery | Admitting: Orthopedic Surgery

## 2021-06-08 ENCOUNTER — Other Ambulatory Visit: Payer: Self-pay

## 2021-06-08 DIAGNOSIS — M25551 Pain in right hip: Secondary | ICD-10-CM | POA: Insufficient documentation

## 2021-06-10 ENCOUNTER — Ambulatory Visit (HOSPITAL_COMMUNITY)
Admission: RE | Admit: 2021-06-10 | Discharge: 2021-06-10 | Disposition: A | Discharge status: Home / Self Care | Payer: Medicare Other | Source: Ambulatory Visit | Attending: Orthopedic Surgery | Admitting: Orthopedic Surgery

## 2021-06-10 ENCOUNTER — Other Ambulatory Visit (HOSPITAL_COMMUNITY): Payer: Self-pay | Admitting: Orthopedic Surgery

## 2021-06-10 DIAGNOSIS — T148XXA Other injury of unspecified body region, initial encounter: Secondary | ICD-10-CM | POA: Insufficient documentation

## 2021-06-10 DIAGNOSIS — M25551 Pain in right hip: Secondary | ICD-10-CM | POA: Diagnosis not present

## 2021-06-25 ENCOUNTER — Encounter: Payer: Self-pay | Admitting: Nurse Practitioner

## 2021-06-25 ENCOUNTER — Non-Acute Institutional Stay (SKILLED_NURSING_FACILITY): Payer: Medicare Other | Admitting: Nurse Practitioner

## 2021-06-25 DIAGNOSIS — M25551 Pain in right hip: Secondary | ICD-10-CM

## 2021-06-25 DIAGNOSIS — R600 Localized edema: Secondary | ICD-10-CM

## 2021-06-25 DIAGNOSIS — M069 Rheumatoid arthritis, unspecified: Secondary | ICD-10-CM

## 2021-06-25 DIAGNOSIS — N1831 Chronic kidney disease, stage 3a: Secondary | ICD-10-CM

## 2021-06-25 DIAGNOSIS — R748 Abnormal levels of other serum enzymes: Secondary | ICD-10-CM

## 2021-06-25 DIAGNOSIS — F418 Other specified anxiety disorders: Secondary | ICD-10-CM

## 2021-06-25 DIAGNOSIS — B3749 Other urogenital candidiasis: Secondary | ICD-10-CM

## 2021-06-25 DIAGNOSIS — E039 Hypothyroidism, unspecified: Secondary | ICD-10-CM

## 2021-06-25 DIAGNOSIS — G4733 Obstructive sleep apnea (adult) (pediatric): Secondary | ICD-10-CM

## 2021-06-25 DIAGNOSIS — K529 Noninfective gastroenteritis and colitis, unspecified: Secondary | ICD-10-CM

## 2021-06-25 DIAGNOSIS — K2100 Gastro-esophageal reflux disease with esophagitis, without bleeding: Secondary | ICD-10-CM

## 2021-06-25 DIAGNOSIS — F5105 Insomnia due to other mental disorder: Secondary | ICD-10-CM

## 2021-06-25 DIAGNOSIS — I10 Essential (primary) hypertension: Secondary | ICD-10-CM

## 2021-06-25 DIAGNOSIS — D500 Iron deficiency anemia secondary to blood loss (chronic): Secondary | ICD-10-CM

## 2021-06-25 NOTE — Progress Notes (Signed)
Location:   [facility] Room Number: 56 Place of Service:  SNF (31) Provider:  Marda Stalker, Lennie Odor NP  Virgie Dad, MD  Patient Care Team: Virgie Dad, MD as PCP - General (Internal Medicine) Dorothy Spark, MD as PCP - Cardiology (Cardiology) Sueanne Margarita, MD as PCP - Sleep Medicine (Cardiology) Donzetta Sprung., MD (Sports Medicine) Linward Natal, MD (Ophthalmology) [name], Katharine Look, MD (Dermatology) Ronald Lobo, MD (Gastroenterology) Thereasa Distance, MD (Nephrology) Jola Baptist, [place] as Referring Physician (Chiropractic Medicine)  Extended Emergency Contact Information Primary Emergency Contact: [facility] Address: [address]          [place], [place] [address] [name] of [place] Phone: [phone] Mobile Phone: [phone] Relation: Spouse  Code Status:  DNR Managed Care Goals of care: Advanced Directive information Advanced Directives 06/25/2021  Does Patient Have a Medical Advance Directive? Yes  Type of Advance Directive Living will  Does patient want to make changes to medical advance directive? No - Patient declined  Copy of [place] in Chart? -     Chief Complaint  Patient presents with   Medical Management of Chronic Issues   Quality Metric Gaps    Influenza vaccine [facility] verified     HPI:  Pt is a 81 y.o. female seen today for medical management of chronic diseases.    Candidiasis, healed urogenital region, treated with 4 day course of Diflucan.  Elevated AST 271>>205 02/17/21>>64 02/25/21>>47 03/04/21,  ALT 253>>275 02/17/21>>72 02/25/21>>44 03/04/21, normal alk phos, bilirubin, Amylase, Lipase. S/p cholecystectomy, [place] 02/19/21 no cyst or mass.               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 F/u Ortho. TDWB. 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 Fully treated, off  antibiotics.              Anemia, post op, s/p EGD no active bleed. S/p 6 u PRBC transfusion, ASA was dc'd, Hgb 10.7 02/15/21. On Fe             HTN only on Furosemide.              Morbid obesity             OSA CPAP             Chronic diarrhea, prn Imodium, Align             RA f/u Rheumatology, hx of Plaquenil use, stable presently.              Insomnia/depression/anxiety, takes Mirtazapine             GERD, stable, on Omeprazole, prn Zofran.              Edema, not apparent, takes Furosemide  CKD stage 3 Bun/creat 41/1.4 02/17/21             Hypothyroidism, takes Levothyroxine, TSH 4.15 03/23/21    Past Medical History:  Diagnosis Date   Benign hypertension with chronic kidney disease, stage III ([place])    Overview:  Last Assessment & Plan:  Usually the patient is hypertensive. However today her blood pressure is 517 systolic. She has been feeling fatigued with some lightheadedness. Part of this may be related to her lower blood pressure. Her pressure may be improved  with her decrease in salt and fluid intake. I've instructed her to put her lisinopril/hctz on hold. I've asked her to see her primary    Cardiac disease 03/10/2014   Chronic diarrhea 07/27/2015   Edema 07/27/2015   Ejection fraction 2004   Normal, echo,    Gastroesophageal reflux disease    GERD (gastroesophageal reflux disease)    Heart disease 03/10/2014   Hypertension    Left bundle branch block 09/01/2020   Obesity    Obesity (BMI 30-39.9) 09/25/2017   OSA (obstructive sleep apnea)    mild with AHI 6.75 - on CPAP   Preop cardiovascular exam 11/2010   Cardiac clearance for knee surgery    Sleep apnea 03/10/2014   Supraventricular tachycardia ([place])    Documented episode in the past, possibly reentrant tachycardia  Overview:  Overview:  Documented episode in the past, 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 It was  possibly reentrant. She does well with diltiazem. No change in therapy.   SVT (supraventricular tachycardia) (HCC)    Documented episode in the past, possibly reentrant tachycardia   Thyroid disease    Urinary incontinence    Past Surgical History:  Procedure Laterality Date   CATARACT EXTRACTION Left 8/11   CATARACT EXTRACTION Right 3/12   CHOLECYSTECTOMY  1994   Copsilotomy Laser Treatment Left 6/12   eye   ELBOW SURGERY  8/12   EYE SURGERY Left 09/2008   macular hole   EYE SURGERY Right 12/11   Lumbar Infusion  2011   MOLE REMOVAL  06/17/2015   TOTAL KNEE ARTHROPLASTY Left 6/11   TOTAL KNEE ARTHROPLASTY Right 06/13/2011    Allergies  Allergen Reactions   Keflex [Cephalexin] Diarrhea   Macrobid [Nitrofurantoin] Hives    Allergies as of 06/25/2021       Reactions   Keflex [cephalexin] Diarrhea   Macrobid [nitrofurantoin] Hives        Medication List        Accurate as of June 25, 2021 11:59 PM. If you have any questions, ask your nurse or doctor.          Acetaminophen 325 MG Chew Chew 650 mg by mouth every 6 (six) hours as needed for pain.   bifidobacterium infantis capsule Take 1 capsule by mouth daily.   CALCIUM PLUS VITAMIN D3 PO Take 1 tablet by mouth daily. Vitamin D3 12mg + Calcium 600   ferrous sulfate 325 (65 FE) MG EC tablet Take 325 mg by mouth. Once A Day on Mon, Wed, Fri;   furosemide 40 MG tablet Commonly known as: LASIX Take 40 mg by mouth 2 (two) times daily.   gabapentin 300 MG capsule Commonly known as: NEURONTIN Take 300 mg by mouth at bedtime.   levothyroxine 25 MCG tablet Commonly known as: SYNTHROID Take 25 mcg by mouth daily before breakfast. Take (2)tabs = 50 mcg; oral every other day   levothyroxine 25 MCG tablet Commonly known as: SYNTHROID Take 25 mcg by mouth every morning. Every other day   loperamide 2 MG capsule Commonly known as: IMODIUM Take 2 mg by mouth every 4 (four) hours as needed.   mirtazapine 15 MG  tablet Commonly known as: REMERON Take 15 mg by mouth at bedtime.   nystatin powder Commonly known as: MYCOSTATIN/NYSTOP Apply 1 application topically 2 (two) times daily. To genital areas and inner thigh   omeprazole 20 MG capsule Commonly  known as: PRILOSEC Take 20 mg by mouth daily.   ondansetron 4 MG disintegrating tablet Commonly known as: ZOFRAN-ODT Take by mouth.   potassium chloride SA 20 MEQ tablet Commonly known as: KLOR-CON M Take 40 mEq by mouth daily.   Vitamin D (Ergocalciferol) 50 MCG (2000 UT) Caps Take by mouth daily.        Review of Systems  Constitutional:  Negative for fatigue, fever and unexpected weight change.  HENT:  Negative for congestion and trouble swallowing.   Eyes:  Negative for visual disturbance.  Respiratory:  Negative for cough.   Cardiovascular:  Negative for leg swelling.  Gastrointestinal:  Negative for abdominal pain and constipation.  Genitourinary:  Negative for dysuria, frequency and urgency.  Musculoskeletal:  Positive for arthralgias and gait problem.  Skin:  Negative for color change.  Neurological:  Negative for speech difficulty, weakness and headaches.  Psychiatric/Behavioral:  Negative for confusion and sleep disturbance. The patient is not nervous/anxious.    Immunization History  Administered Date(s) Administered   Influenza Split 01/28/2008, 02/17/2009, 02/20/2012, 02/11/2013, 02/02/2016, 01/11/2017, 02/04/2019   Influenza, High Dose Seasonal PF 02/04/2015, 01/11/2017, 01/30/2018, 02/05/2020   Moderna Sars-Covid-2 Vaccination 05/09/2019, 05/27/2019, 03/03/2020, 09/04/2020, 11/04/2020   Pfizer Covid-19 Vaccine Bivalent Booster 16yr & up 02/09/2021   Pneumococcal Conjugate-13 08/30/2013   Pneumococcal Polysaccharide-23 11/03/2005   Td 08/01/2001   Tdap 09/22/2010, 11/24/2020   Zoster Recombinat (Shingrix) 11/08/2016, 01/11/2017   Zoster, Live 11/04/2005, 11/08/2016, 01/11/2017   Zoster, Unspecified 01/11/2017    Pertinent  Health Maintenance Due  Topic Date Due   INFLUENZA VACCINE  11/23/2020   DEXA SCAN  Completed   Fall Risk 10/24/2020  Patient Fall Risk Level Moderate fall risk   Functional Status Survey:    Vitals:   06/25/21 1059  BP: 130/66  Pulse: 77  Resp: 18  Temp: 97.7 F (36.5 C)  SpO2: 97%  Weight: 248 lb 9.6 oz (112.8 kg)  Height: '5\' 8"'  (1.727 m)   Body mass index is 37.8 kg/m. Physical Exam Vitals and nursing note reviewed.  Constitutional:      Appearance: She is obese.  HENT:     Head: Normocephalic and atraumatic.     Mouth/Throat:     Mouth: Mucous membranes are moist.  Eyes:     Extraocular Movements: Extraocular movements intact.     Conjunctiva/sclera: Conjunctivae normal.     Pupils: Pupils are equal, round, and reactive to light.  Cardiovascular:     Rate and Rhythm: Normal rate and regular rhythm.     Heart sounds: No murmur heard. Pulmonary:     Effort: Pulmonary effort is normal.  Abdominal:     General: Bowel sounds are normal.     Palpations: Abdomen is soft.     Tenderness: There is no abdominal tenderness. There is no right CVA tenderness, left CVA tenderness, guarding or rebound.  Genitourinary:    Comments: No urogenital irritation or skin breakdown Musculoskeletal:     Cervical back: Normal range of motion and neck supple.     Right lower leg: No edema.     Left lower leg: No edema.     Comments: R hip, prosthetic hip removed.   Skin:    General: Skin is warm and dry.     Findings: No rash.     Comments: Right hip surgical scar.  Neurological:     General: No focal deficit present.     Mental Status: She is alert and oriented to person, place,  and time.     Gait: Gait normal.  Psychiatric:        Mood and Affect: Mood normal.        Behavior: Behavior normal.        Thought Content: Thought content normal.        Judgment: Judgment normal.    Labs reviewed: Recent Labs    10/24/20 1308 02/08/21 0000 02/17/21 0000  02/18/21 0000 02/25/21 0000 03/05/21 0000  NA 139   < > 135* 135* 137 137  K 4.1   < > 4.6 4.6 3.2* 4.0  CL 110   < > 101 101 102 104  CO2 22   < > 26* 26* 26*  --   GLUCOSE 96  --   --   --   --   --   BUN 15   < > 41* 41* 23* 22*  CREATININE 0.98   < > 1.4* 1.4* 1.2* 1.1  CALCIUM 9.2   < > 9.1 9.1 8.9 8.5*   < > = values in this interval not displayed.   Recent Labs    10/24/20 1308 02/08/21 0000 02/17/21 0000 02/25/21 0000 03/05/21 0000  AST 23   < > 205* 64* 44*  ALT 32   < > 275* 72* 47*  ALKPHOS 117   < > 95 85 80  BILITOT 0.6  --   --   --   --   PROT 7.6  --   --   --   --   ALBUMIN 3.6   < > 3.2* 3.0* 2.7*   < > = values in this interval not displayed.   Recent Labs    10/24/20 1308 02/08/21 0000 02/15/21 0000  WBC 9.3 8.2 10.4  NEUTROABS 7.0  --  5,741.00  HGB 10.6* 9.9* 10.7*  HCT 34.8* 31* 34*  MCV 83.7  --   --   PLT 430*  --  413*   Lab Results  Component Value Date   TSH 4.15 03/24/2021   No results found for: HGBA1C No results found for: CHOL, HDL, LDLCALC, LDLDIRECT, TRIG, CHOLHDL  Significant Diagnostic Results in last 30 days:  CT HIP RIGHT WO CONTRAST  Result Date: 06/09/2021 CLINICAL DATA:  History of hip replacement in 2018 and had to be removed in 2022. EXAM: CT OF THE RIGHT HIP WITHOUT CONTRAST TECHNIQUE: Multidetector CT imaging of the right hip was performed according to the standard protocol. Multiplanar CT image reconstructions were also generated. RADIATION DOSE REDUCTION: This exam was performed according to the departmental dose-optimization program which includes automated exposure control, adjustment of the mA and/or kV according to patient size and/or use of iterative reconstruction technique. COMPARISON:  12/03/2020 FINDINGS: Bones/Joint/Cartilage Changes from prior right hip arthroplasty with subsequent removal. Loss of bone stock and thinning of the right acetabulum with fragmentation along the medial aspect of the acetabulum.  Fragmentation of the right greater trochanter. Nondisplaced longitudinal fracture involving the anterior proximal femoral diaphysis. Severe thinning and fragmentation of the posterior margin of the proximal femoral diaphysis at the tip of the previous femoral stem component. Large amount of mixed soft tissue and fluid components at the site of the prior femoral head and neck components which may reflect granulation tissue and postsurgical seroma versus infection. 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 Ligaments Ligaments are suboptimally evaluated by CT. Muscles and Tendons Muscle atrophy of the gluteus minimus and to lesser extent gluteus medius muscles. Soft tissue No fluid collection or  hematoma. No soft tissue mass. Postsurgical scarring in the subcutaneous fat overlying the right hip with a 8.6 x 2.8 x 5.2 cm fluid collection likely reflecting a postsurgical seroma. IMPRESSION: 1. Changes from prior right hip arthroplasty with subsequent removal. Loss of bone stock and thinning of the right acetabulum with fragmentation along the medial aspect of the acetabulum. Fragmentation of the right greater trochanter. Nondisplaced longitudinal fracture involving the anterior proximal femoral diaphysis. Focal severe thinning and fragmentation of the posterior margin of the proximal femoral diaphysis at the tip of the previous femoral stem component. Large amount of mixed soft tissue and fluid components at the site of the prior femoral head and neck components which may reflect granulation tissue and postsurgical seroma versus infection. Electronically Signed   By: Kathreen Devoid M.D.   On: 06/09/2021 08:09   CT 3D RECON AT SCANNER  Result Date: 06/11/2021 CLINICAL DATA:  Nonspecific (abnormal) findings on radiological and other examination of musculoskeletal system. History of right hip replacement in 2018 with subsequent removal. EXAM: 3-DIMENSIONAL CT IMAGE RENDERING ON ACQUISITION WORKSTATION TECHNIQUE:  3-dimensional CT images were rendered by post-processing of the original CT data on an acquisition workstation. The 3-dimensional CT images were interpreted and findings were reported in the accompanying complete CT report for this study COMPARISON:  None. FINDINGS: Changes from prior right hip arthroplasty with subsequent removal. Loss of bone stock and thinning of the right acetabulum with fragmentation along the medial aspect of the acetabulum. fragmentation of the right greater trochanter. Nondisplaced longitudinal fracture involving the anterior proximal femoral diaphysis. Severe thinning and fragmentation of the posterior margin of the proximal femoral diaphysis at the tip of the previous femoral stem component. Large amount of mixed soft tissue and fluid components at the site of the prior femoral head and neck components which may reflect granulation tissue and postsurgical seroma versus infection. 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 IMPRESSION: 1. Changes from prior right hip arthroplasty with subsequent removal. Loss of bone stock and thinning of the right acetabulum with fragmentation along the medial aspect of the acetabulum. Nondisplaced longitudinal fracture involving the anterior proximal femoral diaphysis. Severe thinning and fragmentation of the posterior margin of the proximal femoral diaphysis at the tip of the previous femoral stem component. Large amount of mixed soft tissue and fluid components at the site of the prior femoral head and neck components which may reflect granulation tissue and postsurgical seroma versus infection. Electronically Signed   By: Kathreen Devoid M.D.   On: 06/11/2021 07:01    Assessment/Plan Blood loss anemia post op, s/p EGD no active bleed. S/p 6 u PRBC transfusion, ASA was dc'd, Hgb 10.7 02/15/21. On Fe. Update CBC/diff  Benign essential HTN Blood pressure is controlled, continue Furosemide, update CMP/eGFR  OSA (obstructive sleep apnea) Continue  CPAP  Chronic diarrhea Continue Align, prn Imodium,   Rheumatoid arthritis ([place])  f/u Rheumatology, hx of Plaquenil use, stable presently.   Insomnia secondary to depression with anxiety Stable, continue Mirtazapine.   Gastroesophageal reflux disease  stable, on Omeprazole, prn Zofran.   Edema of lower extremity not apparent, takes Furosemide   CKD (chronic kidney disease) stage 3, GFR 30-59 ml/min (HCC) Bun/creat 41/1.4 02/17/21  Hypothyroidism takes Levothyroxine, TSH 4.15 03/23/21, update TSH  Right hip pain R hip pain, s/p right THR 2018, removal of hardware 12/29/20,  takes Tylenol, Gabapentin. F/u Ortho. TDWB. Power wc  Candidiasis of urogenital site Resolved.   Elevated liver enzymes Elevated AST 271>>205 02/17/21>>64 02/25/21>>47 03/04/21,  ALT 253>>275 02/17/21>>72 02/25/21>>44 03/04/21, normal alk phos, bilirubin, Amylase, Lipase. S/p cholecystectomy, [place] 02/19/21 no cyst or mass.     Family/ staff Communication: plan of care reviewed with the patient and charge nurse.   Labs/tests ordered:  CBC/diff, CMP/eGFR, TSH  Time spend 35 minutes.

## 2021-06-28 ENCOUNTER — Encounter: Payer: Self-pay | Admitting: Nurse Practitioner

## 2021-06-28 NOTE — Assessment & Plan Note (Signed)
Continue CPAP.  

## 2021-06-28 NOTE — Assessment & Plan Note (Signed)
Elevated AST 271>>205 02/17/21>>64 02/25/21>>47 03/04/21,  ALT 253>>275 02/17/21>>72 02/25/21>>44 03/04/21, normal alk phos, bilirubin, Amylase, Lipase. S/p cholecystectomy, [place] 02/19/21 no cyst or mass. ?

## 2021-06-28 NOTE — Assessment & Plan Note (Signed)
Blood pressure is controlled, continue Furosemide, update CMP/eGFR ?

## 2021-06-28 NOTE — Assessment & Plan Note (Signed)
Resolved

## 2021-06-28 NOTE — Assessment & Plan Note (Signed)
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 F/u Ortho. TDWB. Power wc 

## 2021-06-28 NOTE — Assessment & Plan Note (Signed)
Bun/creat 41/1.4 02/17/21 

## 2021-06-28 NOTE — Assessment & Plan Note (Signed)
f/u Rheumatology, hx of Plaquenil use, stable presently.  ?

## 2021-06-28 NOTE — Assessment & Plan Note (Signed)
stable, on Omeprazole, prn Zofran.  

## 2021-06-28 NOTE — Assessment & Plan Note (Signed)
takes Levothyroxine, TSH 4.15 03/23/21, update TSH ?

## 2021-06-28 NOTE — Assessment & Plan Note (Signed)
not apparent, takes Furosemide  

## 2021-06-28 NOTE — Assessment & Plan Note (Signed)
Stable, continue Mirtazapine.  

## 2021-06-28 NOTE — Assessment & Plan Note (Signed)
post op, s/p EGD no active bleed. S/p 6 u PRBC transfusion, ASA was dc'd, Hgb 10.7 02/15/21. On Fe. Update CBC/diff ?

## 2021-06-28 NOTE — Assessment & Plan Note (Signed)
Continue Align, prn Imodium,  ?

## 2021-06-30 LAB — HEPATIC FUNCTION PANEL
ALT: 9 U/L (ref 7–35)
AST: 15 (ref 13–35)
Alkaline Phosphatase: 81 (ref 25–125)
Bilirubin, Total: 0.4

## 2021-06-30 LAB — COMPREHENSIVE METABOLIC PANEL
Albumin: 3.4 — AB (ref 3.5–5.0)
Calcium: 8.9 (ref 8.7–10.7)
Globulin: 2.7

## 2021-06-30 LAB — CBC AND DIFFERENTIAL
HCT: 37 (ref 36–46)
Hemoglobin: 12 (ref 12.0–16.0)
Neutrophils Absolute: 2392
Platelets: 265 10*3/uL (ref 150–400)
WBC: 5.2

## 2021-06-30 LAB — BASIC METABOLIC PANEL
BUN: 24 — AB (ref 4–21)
CO2: 26 — AB (ref 13–22)
Chloride: 104 (ref 99–108)
Creatinine: 1.2 — AB (ref 0.5–1.1)
Glucose: 75
Potassium: 4.2 mEq/L (ref 3.5–5.1)
Sodium: 140 (ref 137–147)

## 2021-06-30 LAB — TSH: TSH: 4.51 (ref 0.41–5.90)

## 2021-06-30 LAB — CBC: RBC: 4.22 (ref 3.87–5.11)

## 2021-07-09 ENCOUNTER — Encounter: Payer: Self-pay | Admitting: Nurse Practitioner

## 2021-07-09 ENCOUNTER — Non-Acute Institutional Stay (SKILLED_NURSING_FACILITY): Payer: Medicare Other | Admitting: Nurse Practitioner

## 2021-07-09 DIAGNOSIS — M069 Rheumatoid arthritis, unspecified: Secondary | ICD-10-CM

## 2021-07-09 DIAGNOSIS — N1831 Chronic kidney disease, stage 3a: Secondary | ICD-10-CM | POA: Diagnosis not present

## 2021-07-09 DIAGNOSIS — I10 Essential (primary) hypertension: Secondary | ICD-10-CM

## 2021-07-09 DIAGNOSIS — R600 Localized edema: Secondary | ICD-10-CM | POA: Diagnosis not present

## 2021-07-09 DIAGNOSIS — E039 Hypothyroidism, unspecified: Secondary | ICD-10-CM

## 2021-07-09 DIAGNOSIS — B3749 Other urogenital candidiasis: Secondary | ICD-10-CM

## 2021-07-09 DIAGNOSIS — M25551 Pain in right hip: Secondary | ICD-10-CM

## 2021-07-09 DIAGNOSIS — G4733 Obstructive sleep apnea (adult) (pediatric): Secondary | ICD-10-CM

## 2021-07-09 DIAGNOSIS — F5105 Insomnia due to other mental disorder: Secondary | ICD-10-CM

## 2021-07-09 DIAGNOSIS — F418 Other specified anxiety disorders: Secondary | ICD-10-CM

## 2021-07-09 DIAGNOSIS — K2100 Gastro-esophageal reflux disease with esophagitis, without bleeding: Secondary | ICD-10-CM

## 2021-07-09 DIAGNOSIS — L89212 Pressure ulcer of right hip, stage 2: Secondary | ICD-10-CM

## 2021-07-09 DIAGNOSIS — D500 Iron deficiency anemia secondary to blood loss (chronic): Secondary | ICD-10-CM

## 2021-07-09 DIAGNOSIS — K529 Noninfective gastroenteritis and colitis, unspecified: Secondary | ICD-10-CM

## 2021-07-09 NOTE — Assessment & Plan Note (Signed)
CPAP.  

## 2021-07-09 NOTE — Assessment & Plan Note (Signed)
Blood pressure is controlled,  only on Furosemide.  

## 2021-07-09 NOTE — Assessment & Plan Note (Signed)
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 F/u Ortho. TDWB. 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 Fully treated, off antibiotics.  

## 2021-07-09 NOTE — Assessment & Plan Note (Signed)
post op, s/p EGD no active bleed. S/p 6 u PRBC transfusion, ASA was dc'd, Hgb 10.7 02/15/21. On Fe 

## 2021-07-09 NOTE — Assessment & Plan Note (Signed)
healed urogenital region, treated with 4 day course of Diflucan.  ?

## 2021-07-09 NOTE — Assessment & Plan Note (Signed)
Bun/creat 41/1.4 02/17/21 

## 2021-07-09 NOTE — Assessment & Plan Note (Signed)
stable, on Omeprazole, prn Zofran.  

## 2021-07-09 NOTE — Assessment & Plan Note (Signed)
hx of Plaquenil use, stable presently.  

## 2021-07-09 NOTE — Assessment & Plan Note (Signed)
Sleeps and eats well, takes Mirtazapine ?

## 2021-07-09 NOTE — Assessment & Plan Note (Signed)
not apparent, takes Furosemide  

## 2021-07-09 NOTE — Progress Notes (Signed)
?Location:   [facility] ?Nursing Home Room Number: 056 ?Place of Service:  SNF (31) ?Provider: Marlana Latus NP ? ?Madison Dad, Madison [name] ? ?Patient Care Team: ?Madison Dad, Madison [name] as PCP - General (Internal Medicine) ?Dorothy Spark, Madison [name] as PCP - Cardiology (Cardiology) ?Sueanne Margarita, Madison [name] as PCP - Sleep Medicine (Cardiology) ?Donzetta Sprung., Madison [name] (Sports Medicine) ?Linward Natal, Madison [name] (Ophthalmology) ?[name], Katharine Look, Madison [name] (Dermatology) ?Ronald Lobo, Madison [name] (Gastroenterology) ?Thereasa Distance, Madison [name] (Nephrology) ?Jola Baptist, [place] as Referring Physician (Chiropractic Medicine) ? ?Extended Emergency Contact Information ?Primary Emergency Contact: Tibbits,[name] ?Address: [address] ?         [place], [place] [address] [place] ?Home Phone: [phone] ?Mobile Phone: [phone] ?Relation: Spouse ? ?Code Status: DNR ?Goals of care: Advanced Directive information ?Advanced Directives 06/25/2021  ?Does Patient Have a Medical Advance Directive? Yes  ?Type of Advance Directive Living will  ?Does patient want to make changes to medical advance directive? No - Patient declined  ?Copy of [place] in Chart? -  ? ? ? ?Chief Complaint  ?Patient presents with  ?? Acute Visit  ?  Skin rash  ? ? ?HPI:  ?Pt is a 81 y.o. female seen today for an acute visit for  ?  ?Candidiasis, healed urogenital region, treated with 4 day course of Diflucan.  ?Elevated AST 271>>205 02/17/21>>64 02/25/21>>47 03/04/21,  ALT 253>>275 02/17/21>>72 02/25/21>>44 03/04/21, normal alk phos, bilirubin, Amylase, Lipase. S/p cholecystectomy, [place] 02/19/21 no cyst or mass.  ?             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 F/u Ortho. TDWB. Power wc ?            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 Fully treated, off antibiotics.  ?            Anemia, post op, s/p EGD no active bleed. S/p 6 u PRBC transfusion, ASA was dc'd, Hgb 10.7 02/15/21.  On Fe ?            HTN only on Furosemide.  ?            Morbid obesity ?            OSA CPAP ?            Chronic diarrhea, prn Imodium, Align ?            RA f/u Rheumatology, hx of Plaquenil use, stable presently.  ?            Insomnia/depression/anxiety, takes Mirtazapine ?            GERD, stable, on Omeprazole, prn Zofran.  ?            Edema, not apparent, takes Furosemide  ?CKD stage 3 Bun/creat 41/1.4 02/17/21 ?            Hypothyroidism, takes Levothyroxine, TSH 4.15 03/23/21 ?  ? ?Past Medical History:  ?Diagnosis Date  ?? Benign hypertension with chronic kidney disease, stage III ([place])   ? Overview:  Last Assessment & Plan:  Usually the patient is hypertensive. However today her blood pressure is 712 systolic. She has been feeling fatigued with some lightheadedness. Part of this may be related to her lower blood pressure. Her pressure may be improved with her decrease in salt and fluid intake. I've instructed her to put her lisinopril/hctz on hold. I've  asked her to see her primary   ?? Cardiac disease 03/10/2014  ?? Chronic diarrhea 07/27/2015  ?? Edema 07/27/2015  ?? Ejection fraction 2004  ? Normal, echo,   ?? Gastroesophageal reflux disease   ?? GERD (gastroesophageal reflux disease)   ?? Heart disease 03/10/2014  ?? Hypertension   ?? Left bundle branch block 09/01/2020  ?? Obesity   ?? Obesity (BMI 30-39.9) 09/25/2017  ?? OSA (obstructive sleep apnea)   ? mild with AHI 6.75 - on CPAP  ?? Preop cardiovascular exam 11/2010  ? Cardiac clearance for knee surgery   ?? Sleep apnea 03/10/2014  ?? Supraventricular tachycardia ([place])   ? Documented episode in the past, possibly reentrant tachycardia  Overview:  Overview:  Documented episode in the past, 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 It was possibly reentrant. She does well with diltiazem. No change in therapy.  ?? SVT (supraventricular tachycardia)  ([place])   ? Documented episode in the past, possibly reentrant tachycardia  ?? Thyroid disease   ?? Urinary incontinence   ? ?Past Surgical History:  ?Procedure Laterality Date  ?? CATARACT EXTRACTION Left 8/11  ?? CATARACT EXTRACTION Right 3/12  ?? CHOLECYSTECTOMY  1994  ?? Copsilotomy Laser Treatment Left 6/12  ? eye  ?? ELBOW SURGERY  8/12  ?? EYE SURGERY Left 09/2008  ? macular hole  ?? EYE SURGERY Right 12/11  ?? Lumbar Infusion  2011  ?? MOLE REMOVAL  06/17/2015  ?? TOTAL KNEE ARTHROPLASTY Left 6/11  ?? TOTAL KNEE ARTHROPLASTY Right 06/13/2011  ? ? ?Allergies  ?Allergen Reactions  ?? Keflex [Cephalexin] Diarrhea  ?? Macrobid [Nitrofurantoin] Hives  ? ? ?Allergies as of 07/09/2021   ? ?   Reactions  ? Keflex [cephalexin] Diarrhea  ? Macrobid [nitrofurantoin] Hives  ? ?  ? ?  ?Medication List  ?  ? ?  ? Accurate as of July 09, 2021 11:59 PM. If you have any questions, ask your nurse or doctor.  ?  ?  ? ?  ? ?Acetaminophen 325 MG Chew ?Chew 650 mg by mouth every 6 (six) hours as needed for pain. ?  ?bifidobacterium infantis capsule ?Take 1 capsule by mouth daily. ?  ?CALCIUM PLUS VITAMIN D3 PO ?Take 1 tablet by mouth daily. Vitamin D3 62mg + Calcium 600 ?  ?ferrous sulfate 325 (65 FE) MG EC tablet ?Take 325 mg by mouth. Once A Day on Mon, Wed, Fri; ?  ?furosemide 40 MG tablet ?Commonly known as: LASIX ?Take 40 mg by mouth 2 (two) times daily. ?  ?gabapentin 300 MG capsule ?Commonly known as: NEURONTIN ?Take 300 mg by mouth at bedtime. ?  ?levothyroxine 25 MCG tablet ?Commonly known as: SYNTHROID ?Take 25 mcg by mouth every other day. Alternate with (2)tabs = 50 mcg; oral every other day ?  ?levothyroxine 25 MCG tablet ?Commonly known as: SYNTHROID ?Take 50 mcg by mouth every other day. ?  ?loperamide 2 MG capsule ?Commonly known as: IMODIUM ?Take 2 mg by mouth every 4 (four) hours as needed. ?  ?mirtazapine 15 MG tablet ?Commonly known as: REMERON ?Take 15 mg by mouth at bedtime. ?  ?nystatin powder ?Commonly known  as: MYCOSTATIN/NYSTOP ?Apply 1 application topically 2 (two) times daily. To genital areas and inner thigh ?  ?omeprazole 20 MG capsule ?Commonly known as: PRILOSEC ?Take 20 mg by mouth daily. ?  ?ondansetron 4 MG disintegrating tablet ?Commonly known as:  ZOFRAN-ODT ?Take by mouth. ?  ?potassium chloride SA 20 MEQ tablet ?Commonly known as: KLOR-CON M ?Take 40 mEq by mouth daily. ?  ?Vitamin D (Ergocalciferol) 50 MCG (2000 UT) Caps ?Take by mouth daily. ?  ? ?  ? ? ?Review of Systems  ?Constitutional:  Negative for fatigue, fever and unexpected weight change.  ?HENT:  Negative for congestion and trouble swallowing.   ?Eyes:  Negative for visual disturbance.  ?Respiratory:  Negative for cough.   ?Cardiovascular:  Negative for leg swelling.  ?Gastrointestinal:  Negative for abdominal pain and constipation.  ?Genitourinary:  Negative for dysuria, frequency and urgency.  ?Musculoskeletal:  Positive for arthralgias and gait problem.  ?Skin:  Negative for color change.  ?Neurological:  Negative for speech difficulty, weakness and headaches.  ?Psychiatric/Behavioral:  Negative for confusion and sleep disturbance. The patient is not nervous/anxious.   ? ?Immunization History  ?Administered Date(s) Administered  ?? Influenza Split 01/28/2008, 02/17/2009, 02/20/2012, 02/11/2013, 02/02/2016, 01/11/2017, 02/04/2019  ?? Influenza, High Dose Seasonal PF 02/04/2015, 01/11/2017, 01/30/2018, 02/05/2020  ?? Moderna Sars-Covid-2 Vaccination 05/09/2019, 05/27/2019, 03/03/2020, 09/04/2020, 11/04/2020  ?? Pension scheme manager 89yr & up 02/09/2021  ?? Pneumococcal Conjugate-13 08/30/2013  ?? Pneumococcal Polysaccharide-23 11/03/2005  ?? Td 08/01/2001  ?? Tdap 09/22/2010, 11/24/2020  ?? Zoster Recombinat (Shingrix) 11/08/2016, 01/11/2017  ?? Zoster, Live 11/04/2005, 11/08/2016, 01/11/2017  ?? Zoster, Unspecified 01/11/2017  ? ?Pertinent  Health Maintenance Due  ?Topic Date Due  ?? INFLUENZA VACCINE  11/23/2020  ?? DEXA  SCAN  Completed  ? ?Fall Risk 10/24/2020  ?Patient Fall Risk Level Moderate fall risk  ? ?Functional Status Survey: ?  ? ?Vitals:  ? 07/09/21 1643  ?BP: 110/66  ?Pulse: 62  ?Resp: 18  ?Temp: 98 ?F (36

## 2021-07-09 NOTE — Assessment & Plan Note (Signed)
prn Imodium, Align 

## 2021-07-09 NOTE — Assessment & Plan Note (Signed)
takes Levothyroxine, TSH 4.15 03/23/21 ?

## 2021-07-12 DIAGNOSIS — L8992 Pressure ulcer of unspecified site, stage 2: Secondary | ICD-10-CM | POA: Insufficient documentation

## 2021-07-12 NOTE — Assessment & Plan Note (Signed)
Linear open area posterior right thigh below the gluteal sulcus, mild redness and warmth in the region, small amount of serous drainage noted, will apply Silvadene cream, monitor for s/s of infection.  ?

## 2021-07-13 ENCOUNTER — Encounter: Payer: Self-pay | Admitting: Nurse Practitioner

## 2021-08-06 ENCOUNTER — Non-Acute Institutional Stay (SKILLED_NURSING_FACILITY): Payer: Medicare Other | Admitting: Nurse Practitioner

## 2021-08-06 ENCOUNTER — Encounter: Payer: Self-pay | Admitting: Nurse Practitioner

## 2021-08-06 DIAGNOSIS — R600 Localized edema: Secondary | ICD-10-CM

## 2021-08-06 DIAGNOSIS — F5105 Insomnia due to other mental disorder: Secondary | ICD-10-CM

## 2021-08-06 DIAGNOSIS — K2100 Gastro-esophageal reflux disease with esophagitis, without bleeding: Secondary | ICD-10-CM | POA: Diagnosis not present

## 2021-08-06 DIAGNOSIS — D500 Iron deficiency anemia secondary to blood loss (chronic): Secondary | ICD-10-CM

## 2021-08-06 DIAGNOSIS — F418 Other specified anxiety disorders: Secondary | ICD-10-CM

## 2021-08-06 DIAGNOSIS — B372 Candidiasis of skin and nail: Secondary | ICD-10-CM

## 2021-08-06 DIAGNOSIS — N1831 Chronic kidney disease, stage 3a: Secondary | ICD-10-CM

## 2021-08-06 DIAGNOSIS — E039 Hypothyroidism, unspecified: Secondary | ICD-10-CM

## 2021-08-06 DIAGNOSIS — I10 Essential (primary) hypertension: Secondary | ICD-10-CM | POA: Diagnosis not present

## 2021-08-06 DIAGNOSIS — M25551 Pain in right hip: Secondary | ICD-10-CM

## 2021-08-06 DIAGNOSIS — M069 Rheumatoid arthritis, unspecified: Secondary | ICD-10-CM

## 2021-08-06 DIAGNOSIS — R748 Abnormal levels of other serum enzymes: Secondary | ICD-10-CM

## 2021-08-06 NOTE — Assessment & Plan Note (Signed)
AST/ALT normalized, 15/9 06/30/21, S/p cholecystectomy, [place] 02/19/21 no cyst or mass.  ?

## 2021-08-06 NOTE — Assessment & Plan Note (Signed)
post op, s/p EGD no active bleed. S/p 6 u PRBC transfusion, ASA was dc'd,  Hgb 12.0 06/30/21, will reduce Fe to 2x/wk ?

## 2021-08-06 NOTE — Assessment & Plan Note (Signed)
Stable, continue Mirtazapine.  

## 2021-08-06 NOTE — Assessment & Plan Note (Addendum)
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 F/u Ortho. TDWB. 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 Fully treated, off antibiotics.  

## 2021-08-06 NOTE — Assessment & Plan Note (Signed)
Lower abd skin fold redness, c/o burning sensation, will apply Nystatin cream bid until healed.  ?

## 2021-08-06 NOTE — Assessment & Plan Note (Signed)
Blood pressure is controlled, takes Furosemide.  

## 2021-08-06 NOTE — Progress Notes (Signed)
?Location:   [facility] ?Nursing Home Room Number: No56/A ?Place of Service:  SNF (31) ?Provider: Chipper Oman NP ? ?Mahlon Gammon, MD ? ?Patient Care Team: ?Mahlon Gammon, MD as PCP - General (Internal Medicine) ?[name], MD as PCP - Cardiology (Cardiology) ?[name], MD as PCP - Sleep Medicine (Cardiology) ?[name], MD (Sports Medicine) ?[name], MD (Ophthalmology) ?[name], [name], MD (Dermatology) ?[name], MD (Gastroenterology) ?[name], MD (Nephrology) ?[name], DC as Referring Physician (Chiropractic Medicine) ? ?Extended Emergency Contact Information ?Primary Emergency Contact: Crissman,[name] ?Address: [address] ?         [place], [place] [address] [place] ?Home Phone: [phone] ?Mobile Phone: [phone] ?Relation: Spouse ? ?Code Status:  DNR ?Goals of care: Advanced Directive information ? ?  08/06/2021  ?  8:41 AM  ?Advanced Directives  ?Does Patient Have a Medical Advance Directive? Yes  ?Type of Advance Directive Out of facility DNR (pink MOST or yellow form)  ?Does patient want to make changes to medical advance directive? No - Patient declined  ?Pre-existing out of facility DNR order (yellow form or pink MOST form) Pink MOST form placed in chart (order not valid for inpatient use)  ? ? ? ?Chief Complaint  ?Patient presents with  ? Medical Management of Chronic Issues  ?  Routine follow up   ? ? ?HPI:  ?Pt is a 81 y.o. female seen today for medical management of chronic diseases.   ?  ?Elevated AST/ALT normalized, 15/9 06/30/21, S/p cholecystectomy, [place] 02/19/21 no cyst or mass.  ?             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 F/u Ortho. TDWB. Power wc ?            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 Fully treated, off antibiotics.  ?            Anemia, post op, s/p EGD no active bleed. S/p 6 u PRBC transfusion, ASA was dc'd, On Fe,  Hgb 12.0 06/30/21 ?            HTN only on Furosemide.  ?            Morbid obesity ?            OSA CPAP ?            Chronic diarrhea, prn Imodium, Align ?            RA f/u Rheumatology, hx of Plaquenil use, stable presently.  ?            Insomnia/depression/anxiety, takes Mirtazapine ?            GERD, stable, on Omeprazole, prn Zofran.  ?            Edema, not apparent, takes Furosemide  ?CKD stage 3 Bun/creat 24/1.2 06/30/21 ?            Hypothyroidism, takes Levothyroxine, TSH 4.51 06/30/21 ? ?Past Medical History:  ?Diagnosis Date  ? Benign hypertension with chronic kidney disease, stage III ([facility])   ? Overview:  Last Assessment & Plan:  Usually the patient is hypertensive. However today her blood pressure is 105 systolic. She has been feeling fatigued with some lightheadedness. Part of this may be related to her lower blood pressure. Her pressure may be improved with her decrease in salt and  fluid intake. I've instructed her to put her lisinopril/hctz on hold. I've asked her to see her primary   ? Cardiac disease 03/10/2014  ? Chronic diarrhea 07/27/2015  ? Edema 07/27/2015  ? Ejection fraction 2004  ? Normal, echo,   ? Gastroesophageal reflux disease   ? GERD (gastroesophageal reflux disease)   ? Heart disease 03/10/2014  ? Hypertension   ? Left bundle branch block 09/01/2020  ? Obesity   ? Obesity (BMI 30-39.9) 09/25/2017  ? OSA (obstructive sleep apnea)   ? mild with AHI 6.75 - on CPAP  ? Preop cardiovascular exam 11/2010  ? Cardiac clearance for knee surgery   ? Sleep apnea 03/10/2014  ? Supraventricular tachycardia ([facility])   ? Documented episode in the past, possibly reentrant tachycardia  Overview:  Overview:  Documented episode in the past, 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 It was possibly reentrant. She does well with diltiazem. No change in therapy.  ? SVT (supraventricular tachycardia) ([facility])   ?  Documented episode in the past, possibly reentrant tachycardia  ? Thyroid disease   ? Urinary incontinence   ? ?Past Surgical History:  ?Procedure Laterality Date  ? CATARACT EXTRACTION Left 8/11  ? CATARACT EXTRACTION Right 3/12  ? CHOLECYSTECTOMY  1994  ? Copsilotomy Laser Treatment Left 6/12  ? eye  ? ELBOW SURGERY  8/12  ? EYE SURGERY Left 09/2008  ? macular hole  ? EYE SURGERY Right 12/11  ? Lumbar Infusion  2011  ? MOLE REMOVAL  06/17/2015  ? TOTAL KNEE ARTHROPLASTY Left 6/11  ? TOTAL KNEE ARTHROPLASTY Right 06/13/2011  ? ? ?Allergies  ?Allergen Reactions  ? Keflex [Cephalexin] Diarrhea  ? Macrobid [Nitrofurantoin] Hives  ? ? ?Allergies as of 08/06/2021   ? ?   Reactions  ? Keflex [cephalexin] Diarrhea  ? Macrobid [nitrofurantoin] Hives  ? ?  ? ?  ?Medication List  ?  ? ?  ? Accurate as of August 06, 2021 11:59 PM. If you have any questions, ask your nurse or doctor.  ?  ?  ? ?  ? ?Acetaminophen 325 MG Chew ?Chew 650 mg by mouth every 6 (six) hours as needed for pain. ?  ?bifidobacterium infantis capsule ?Take 1 capsule by mouth daily. ?  ?CALCIUM PLUS VITAMIN D3 PO ?Take 1 tablet by mouth daily. Vitamin D3 20mcg + Calcium 600 ?  ?ferrous sulfate 325 (65 FE) MG EC tablet ?Take 325 mg by mouth. Once A Day on Mon, Wed, Fri; ?  ?furosemide 40 MG tablet ?Commonly known as: LASIX ?Take 40 mg by mouth 2 (two) times daily. ?  ?gabapentin 300 MG capsule ?Commonly known as: NEURONTIN ?Take 300 mg by mouth at bedtime. ?  ?levothyroxine 25 MCG tablet ?Commonly known as: SYNTHROID ?Take 25 mcg by mouth every other day. Alternate with (2)tabs = 50 mcg; oral every other day ?  ?levothyroxine 25 MCG tablet ?Commonly known as: SYNTHROID ?Take 50 mcg by mouth every other day. ?  ?loperamide 2 MG capsule ?Commonly known as: IMODIUM ?Take 2 mg by mouth every 4 (four) hours as needed. ?  ?mirtazapine 15 MG tablet ?Commonly known as: REMERON ?Take 15 mg by mouth at bedtime. ?  ?nystatin powder ?Commonly known as:  MYCOSTATIN/NYSTOP ?Apply 1 application topically 2 (two) times daily. To genital areas and inner thigh ?  ?omeprazole 20 MG capsule ?Commonly known as: PRILOSEC ?Take 20 mg by  mouth daily. ?  ?ondansetron 4 MG disintegrating tablet ?Commonly known as: ZOFRAN-ODT ?Take by mouth. ?  ?potassium chloride SA 20 MEQ tablet ?Commonly known as: KLOR-CON M ?Take 40 mEq by mouth daily. ?  ?Vitamin D (Ergocalciferol) 50 MCG (2000 UT) Caps ?Take by mouth daily. ?  ? ?  ? ? ?Review of Systems  ?Constitutional:  Negative for fatigue, fever and unexpected weight change.  ?HENT:  Negative for congestion and trouble swallowing.   ?Eyes:  Negative for visual disturbance.  ?Respiratory:  Negative for cough.   ?Cardiovascular:  Negative for leg swelling.  ?Gastrointestinal:  Negative for abdominal pain and constipation.  ?Genitourinary:  Negative for dysuria, frequency and urgency.  ?Musculoskeletal:  Positive for arthralgias and gait problem.  ?Skin:  Positive for rash. Negative for color change.  ?Neurological:  Negative for speech difficulty, weakness and headaches.  ?Psychiatric/Behavioral:  Negative for confusion and sleep disturbance. The patient is not nervous/anxious.   ? ?Immunization History  ?Administered Date(s) Administered  ? Influenza Split 01/28/2008, 02/17/2009, 02/20/2012, 02/11/2013, 02/02/2016, 01/11/2017, 02/04/2019  ? Influenza, High Dose Seasonal PF 02/04/2015, 01/11/2017, 01/30/2018, 02/05/2020  ? Moderna Sars-Covid-2 Vaccination 05/09/2019, 05/27/2019, 03/03/2020, 09/04/2020, 11/04/2020  ? Research officer, trade union 29yrs & up 02/09/2021  ? Pneumococcal Conjugate-13 08/30/2013  ? Pneumococcal Polysaccharide-23 11/03/2005  ? Td 08/01/2001  ? Tdap 09/22/2010, 11/24/2020  ? Zoster Recombinat (Shingrix) 11/08/2016, 01/11/2017  ? Zoster, Live 11/04/2005, 11/08/2016, 01/11/2017  ? Zoster, Unspecified 01/11/2017  ? ?Pertinent  Health Maintenance Due  ?Topic Date Due  ? INFLUENZA VACCINE  11/23/2021  ?  DEXA SCAN  Completed  ? ? ?  10/24/2020  ? 11:31 AM  ?Fall Risk  ?Patient Fall Risk Level Moderate fall risk  ? ?Functional Status Survey: ?  ? ?Vitals:  ? 08/06/21 0842  ?BP: 120/60  ?Pulse: 70  ?Resp: 18  ?Temp: (!) 97.5 ?F (

## 2021-08-06 NOTE — Assessment & Plan Note (Signed)
Bun/creat 24/1.2 06/30/21 ?

## 2021-08-06 NOTE — Assessment & Plan Note (Signed)
RA f/u Rheumatology, hx of Plaquenil use, stable presently.  

## 2021-08-06 NOTE — Assessment & Plan Note (Signed)
,   stable, on Omeprazole, prn Zofran.  ?

## 2021-08-06 NOTE — Assessment & Plan Note (Signed)
Well controlled, takes Furosemide.  ?

## 2021-08-06 NOTE — Assessment & Plan Note (Signed)
takes Levothyroxine, TSH 4.51 06/30/21 

## 2021-08-09 ENCOUNTER — Encounter: Payer: Self-pay | Admitting: Nurse Practitioner

## 2021-10-05 ENCOUNTER — Encounter: Payer: Self-pay | Admitting: Internal Medicine

## 2021-10-05 ENCOUNTER — Non-Acute Institutional Stay (SKILLED_NURSING_FACILITY): Payer: Medicare Other | Admitting: Internal Medicine

## 2021-10-05 DIAGNOSIS — N1831 Chronic kidney disease, stage 3a: Secondary | ICD-10-CM

## 2021-10-05 DIAGNOSIS — E039 Hypothyroidism, unspecified: Secondary | ICD-10-CM | POA: Diagnosis not present

## 2021-10-05 DIAGNOSIS — R635 Abnormal weight gain: Secondary | ICD-10-CM

## 2021-10-05 DIAGNOSIS — G4733 Obstructive sleep apnea (adult) (pediatric): Secondary | ICD-10-CM

## 2021-10-05 DIAGNOSIS — I10 Essential (primary) hypertension: Secondary | ICD-10-CM

## 2021-10-05 DIAGNOSIS — R600 Localized edema: Secondary | ICD-10-CM

## 2021-10-05 DIAGNOSIS — D500 Iron deficiency anemia secondary to blood loss (chronic): Secondary | ICD-10-CM

## 2021-10-05 DIAGNOSIS — K2100 Gastro-esophageal reflux disease with esophagitis, without bleeding: Secondary | ICD-10-CM

## 2021-10-05 DIAGNOSIS — F418 Other specified anxiety disorders: Secondary | ICD-10-CM

## 2021-10-05 DIAGNOSIS — F5105 Insomnia due to other mental disorder: Secondary | ICD-10-CM

## 2021-10-05 NOTE — Progress Notes (Unsigned)
Location:   [facility] Room Number: [id number] Place of Service:  SNF [phone]) Provider:  Einar Crow MD  Mahlon Gammon, MD  Patient Care Team: Mahlon Gammon, MD as PCP - General (Internal Medicine) [name], MD as PCP - Cardiology (Cardiology) [name], MD as PCP - Sleep Medicine (Cardiology) [name], MD (Sports Medicine) [name], MD (Ophthalmology) [name], [name], MD (Dermatology) [name], MD (Gastroenterology) [name], MD (Nephrology) [name], DC as Referring Physician (Chiropractic Medicine)  Extended Emergency Contact Information Primary Emergency Contact: [facility] Address: [address]          [place], [place] [address] [name] of [place] Home Phone: [phone] Mobile Phone: [phone] Relation: Spouse  Code Status:  DNR Managed Care Goals of care: Advanced Directive information    10/05/2021    3:19 PM  Advanced Directives  Does Patient Have a Medical Advance Directive? Yes  Type of Advance Directive Out of facility DNR (pink MOST or yellow form)  Does patient want to make changes to medical advance directive? No - Patient declined  Pre-existing out of facility DNR order (yellow form or pink MOST form) Pink MOST form placed in chart (order not valid for inpatient use)     Chief Complaint  Patient presents with   Medical Management of Chronic Issues    HPI:  Pt is a 81 y.o. female seen today for medical management of chronic diseases.    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 Staff uses Nurse, adult for transfers but then she is up in her Chair and able to go out and spend time with her husband Had no New complains today    No new Nursing issues. No Behavior issues Has gained weight  No Falls Wt Readings from Last 3 Encounters:  10/05/21 259 lb 9.6 oz (117.8 kg)  08/06/21 250 lb (113.4 kg)  07/09/21 248 lb 9.6 oz (112.8 kg)    Past Medical History:  Diagnosis Date   Benign hypertension with chronic kidney disease, stage III (HCC)    Overview:  Last Assessment & Plan:  Usually the patient is hypertensive. However today her blood pressure is 105 systolic. She has been feeling fatigued with some lightheadedness. Part of this may be related to her lower blood pressure. Her pressure may be improved with her decrease in salt and fluid intake. I've instructed her to put her lisinopril/hctz on hold. I've asked her to see her primary    Cardiac disease 03/10/2014   Chronic diarrhea 07/27/2015   Edema 07/27/2015   Ejection fraction 2004   Normal, echo,    Gastroesophageal reflux disease    GERD (gastroesophageal reflux disease)    Heart disease 03/10/2014   Hypertension    Left bundle branch block 09/01/2020   Obesity    Obesity (BMI 30-39.9) 09/25/2017   OSA (obstructive sleep apnea)    mild with AHI 6.75 - on CPAP   Preop cardiovascular exam 11/2010   Cardiac clearance for knee surgery    Sleep  apnea 03/10/2014   Supraventricular tachycardia (HCC)    Documented episode in the past, possibly reentrant tachycardia  Overview:  Overview:  Documented episode in the past, 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 It was possibly reentrant. She does well with diltiazem. No change in therapy.   SVT (supraventricular tachycardia) (HCC)    Documented episode in the past, possibly reentrant tachycardia   Thyroid disease    Urinary  incontinence    Past Surgical History:  Procedure Laterality Date   CATARACT EXTRACTION Left 8/11   CATARACT EXTRACTION Right 3/12   CHOLECYSTECTOMY  1994   Copsilotomy Laser Treatment Left 6/12   eye   ELBOW SURGERY  8/12   EYE SURGERY Left 09/2008   macular hole   EYE SURGERY Right 12/11   Lumbar Infusion  2011   MOLE REMOVAL  06/17/2015   TOTAL KNEE ARTHROPLASTY Left 6/11   TOTAL KNEE ARTHROPLASTY Right 06/13/2011    Allergies  Allergen Reactions   Keflex [Cephalexin] Diarrhea   Macrobid [Nitrofurantoin] Hives    Allergies as of 10/05/2021       Reactions   Keflex [cephalexin] Diarrhea   Macrobid [nitrofurantoin] Hives        Medication List        Accurate as of October 05, 2021  3:19 PM. If you have any questions, ask your nurse or doctor.          Acetaminophen 325 MG Chew Chew 650 mg by mouth every 6 (six) hours as needed for pain.   bifidobacterium infantis capsule Take 1 capsule by mouth daily.   CALCIUM PLUS VITAMIN D3 PO Take 1 tablet by mouth daily. Vitamin D3 + Calcium 600   ferrous sulfate 325 (65 FE) MG EC tablet Take 325 mg by mouth. Once A Day on Mon, Thu   furosemide 40 MG tablet Commonly known as: LASIX Take 40 mg by mouth 2 (two) times daily.   gabapentin 300 MG capsule Commonly known as: NEURONTIN Take 300 mg by mouth at bedtime.   levothyroxine 25 MCG tablet Commonly known as: SYNTHROID Take 25 mcg by mouth every other day. Alternate with (2)tabs = 50 mcg; oral every other day   levothyroxine 25 MCG tablet Commonly known as: SYNTHROID Take 50 mcg by mouth every other day.   loperamide 2 MG capsule Commonly known as: IMODIUM Take 2 mg by mouth every 4 (four) hours as needed.   mirtazapine 15 MG tablet Commonly known as: REMERON Take 15 mg by mouth at bedtime.   nystatin powder Commonly known as: MYCOSTATIN/NYSTOP Apply 1 application topically 2 (two) times daily. To genital areas and inner thigh   omeprazole 20  MG capsule Commonly known as: PRILOSEC Take 20 mg by mouth daily.   ondansetron 4 MG disintegrating tablet Commonly known as: ZOFRAN-ODT Take 4 mg by mouth every 8 (eight) hours as needed.   potassium chloride SA 20 MEQ tablet Commonly known as: KLOR-CON M Take 40 mEq by mouth daily.   Vitamin D (Ergocalciferol) 50 MCG (2000 UT) Caps Take by mouth daily.        Review of Systems  Constitutional:  Negative for activity change and appetite change.  HENT: Negative.    Respiratory:  Negative for cough and shortness of breath.   Cardiovascular:  Negative for leg swelling.  Gastrointestinal:  Negative for constipation.  Genitourinary: Negative.   Musculoskeletal:  Negative for  arthralgias, gait problem and myalgias.  Skin: Negative.   Neurological:  Negative for dizziness and weakness.  Psychiatric/Behavioral:  Negative for confusion, dysphoric mood and sleep disturbance.     Immunization History  Administered Date(s) Administered   Influenza Split 01/28/2008, 02/17/2009, 02/20/2012, 02/11/2013, 02/02/2016, 01/11/2017, 02/04/2019   Influenza, High Dose Seasonal PF 02/04/2015, 01/11/2017, 01/30/2018, 02/05/2020   Moderna Sars-Covid-2 Vaccination 05/09/2019, 05/27/2019, 03/03/2020, 09/04/2020, 11/04/2020   Pfizer Covid-19 Vaccine Bivalent Booster 71yrs & up 02/09/2021   Pneumococcal Conjugate-13 08/30/2013   Pneumococcal Polysaccharide-23 11/03/2005   Td 08/01/2001   Tdap 09/22/2010, 11/24/2020   Zoster Recombinat (Shingrix) 11/08/2016, 01/11/2017   Zoster, Live 11/04/2005, 11/08/2016, 01/11/2017   Zoster, Unspecified 01/11/2017   Pertinent  Health Maintenance Due  Topic Date Due   INFLUENZA VACCINE  11/23/2021   DEXA SCAN  Completed      10/24/2020   11:31 AM  Fall Risk  Patient Fall Risk Level Moderate fall risk   Functional Status Survey:    Vitals:   10/05/21 1514  BP: 110/62  Pulse: 70  Resp: 18  Temp: 98 F (36.7 C)  SpO2: 97%  Weight: 259 lb 9.6 oz  (117.8 kg)  Height: 5\' 8"  (1.727 m)   Body mass index is 39.47 kg/m. Physical Exam Vitals reviewed.  Constitutional:      Appearance: Normal appearance. She is obese.  HENT:     Head: Normocephalic.     Nose: Nose normal.     Mouth/Throat:     Mouth: Mucous membranes are moist.     Pharynx: Oropharynx is clear.  Eyes:     Pupils: Pupils are equal, round, and reactive to light.  Cardiovascular:     Rate and Rhythm: Normal rate and regular rhythm.     Pulses: Normal pulses.     Heart sounds: Normal heart sounds. No murmur heard. Pulmonary:     Effort: Pulmonary effort is normal.     Breath sounds: Normal breath sounds.  Abdominal:     General: Abdomen is flat. Bowel sounds are normal.     Palpations: Abdomen is soft.  Musculoskeletal:        General: No swelling.     Cervical back: Neck supple.  Skin:    General: Skin is warm.  Neurological:     General: No focal deficit present.     Mental Status: She is alert and oriented to person, place, and time.  Psychiatric:        Mood and Affect: Mood normal.        Thought Content: Thought content normal.     Labs reviewed: Recent Labs    10/24/20 1308 02/08/21 0000 02/18/21 0000 02/25/21 0000 03/05/21 0000 06/30/21 0000  NA 139   < > 135* 137 137 140  K 4.1   < > 4.6 3.2* 4.0 4.2  CL 110   < > 101 102 104 104  CO2 22   < > 26* 26*  --  26*  GLUCOSE 96  --   --   --   --   --   BUN 15   < > 41* 23* 22* 24*  CREATININE 0.98   < > 1.4* 1.2* 1.1 1.2*  CALCIUM 9.2   < > 9.1 8.9 8.5* 8.9   < > = values in this interval not displayed.   Recent Labs    10/24/20 1308 02/08/21 0000 02/25/21 0000 03/05/21 0000 06/30/21 0000  AST 23   < > 64* 44* 15  ALT 32   < > 72* 47* 9  ALKPHOS 117   < > 85 80 81  BILITOT 0.6  --   --   --   --   PROT 7.6  --   --   --   --   ALBUMIN 3.6   < > 3.0* 2.7* 3.4*   < > = values in this interval not displayed.   Recent Labs    10/24/20 1308 02/08/21 0000 02/15/21 0000  06/30/21 0000  WBC 9.3 8.2 10.4 5.2  NEUTROABS 7.0  --  5,741.00 2,392.00  HGB 10.6* 9.9* 10.7* 12.0  HCT 34.8* 31* 34* 37  MCV 83.7  --   --   --   PLT 430*  --  413* 265   Lab Results  Component Value Date   TSH 4.51 06/30/2021   No results found for: "HGBA1C" No results found for: "CHOL", "HDL", "LDLCALC", "LDLDIRECT", "TRIG", "CHOLHDL"  Significant Diagnostic Results in last 30 days:  No results found.  Assessment/Plan 1. Benign essential HTN Stable  Off all meds Just Lasix  2. Edema of lower extremity On Lasix Creat stable  3. Stage 3a chronic kidney disease (HCC) Creat stable  4. Hypothyroidism, unspecified type TSH normal in 03/23  5. Gastroesophageal reflux disease with esophagitis without hemorrhage On Prilosec  6. Insomnia secondary to depression with anxiety On Remeron  7. OSA (obstructive sleep apnea) Uses CPAP 8. Blood loss anemia On Low dose of iron HGB normal now  9. Weight gain If Continues to gain weight have to consider tapering Remeron off 10 Rheumatoid arthritis involving multiple sites,  Has been on Plaquenil before No Active symptoms right now  11 S/P Removal of Right Hip joint Not candidate for Replacement Non Weight bearing Uses Power Chair  Family/ staff Communication:   Labs/tests ordered:

## 2021-10-19 ENCOUNTER — Encounter: Payer: Self-pay | Admitting: Internal Medicine

## 2021-10-19 ENCOUNTER — Non-Acute Institutional Stay (SKILLED_NURSING_FACILITY): Payer: Medicare Other | Admitting: Internal Medicine

## 2021-10-19 DIAGNOSIS — E039 Hypothyroidism, unspecified: Secondary | ICD-10-CM

## 2021-10-19 DIAGNOSIS — N1831 Chronic kidney disease, stage 3a: Secondary | ICD-10-CM | POA: Diagnosis not present

## 2021-10-19 DIAGNOSIS — I10 Essential (primary) hypertension: Secondary | ICD-10-CM

## 2021-10-19 DIAGNOSIS — R1032 Left lower quadrant pain: Secondary | ICD-10-CM

## 2021-10-19 DIAGNOSIS — R600 Localized edema: Secondary | ICD-10-CM

## 2021-10-19 LAB — BASIC METABOLIC PANEL
BUN: 24 — AB (ref 4–21)
CO2: 29 — AB (ref 13–22)
Chloride: 103 (ref 99–108)
Creatinine: 1 (ref 0.5–1.1)
Glucose: 96
Potassium: 4.5 mEq/L (ref 3.5–5.1)
Sodium: 139 (ref 137–147)

## 2021-10-19 LAB — COMPREHENSIVE METABOLIC PANEL
Albumin: 3.8 (ref 3.5–5.0)
Calcium: 9.2 (ref 8.7–10.7)
Globulin: 3.6
eGFR: 54

## 2021-10-19 LAB — HEPATIC FUNCTION PANEL
ALT: 12 U/L (ref 7–35)
AST: 14 (ref 13–35)
Alkaline Phosphatase: 98 (ref 25–125)
Bilirubin, Total: 0.6

## 2021-10-19 LAB — CBC AND DIFFERENTIAL
HCT: 40 (ref 36–46)
Hemoglobin: 13.5 (ref 12.0–16.0)
Neutrophils Absolute: 5340
Platelets: 281 10*3/uL (ref 150–400)
WBC: 7.9

## 2021-10-19 LAB — CBC: RBC: 4.71 (ref 3.87–5.11)

## 2021-10-19 NOTE — Progress Notes (Signed)
Location: [facility] Room Number: 39 Place of Service:  SNF ([id number])  Provider:   Code Status:  Goals of Care:     10/05/2021    3:19 PM  Advanced Directives  Does Patient Have a Medical Advance Directive? Yes  Type of Advance Directive Out of facility DNR (pink MOST or yellow form)  Does patient want to make changes to medical advance directive? No - Patient declined  Pre-existing out of facility DNR order (yellow form or pink MOST form) Pink MOST form placed in chart (order not valid for inpatient use)     Chief Complaint  Patient presents with   Acute Visit    Abdominal Pain     HPI: Patient is a 81 y.o. 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 However today her blood pressure is 105 systolic. She has been feeling fatigued with some lightheadedness. Part of this may be related to her lower blood pressure. Her pressure may be improved with her decrease in salt and fluid intake. I've instructed her to put her lisinopril/hctz on hold. I've asked her to see her primary    Cardiac disease 03/10/2014   Chronic diarrhea 07/27/2015   Edema 07/27/2015   Ejection fraction 2004   Normal,  echo,    Gastroesophageal reflux disease    GERD (gastroesophageal reflux disease)    Heart disease 03/10/2014   Hypertension    Left bundle branch block 09/01/2020   Obesity    Obesity (BMI 30-39.9) 09/25/2017   OSA (obstructive sleep apnea)    mild with AHI 6.75 - on CPAP   Preop cardiovascular exam 11/2010   Cardiac clearance for knee surgery    Sleep apnea 03/10/2014   Supraventricular tachycardia (HCC)    Documented episode in the past, possibly reentrant tachycardia  Overview:  Overview:  Documented episode in the past, 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 It was possibly reentrant. She does well with diltiazem. No change in therapy.   SVT (supraventricular tachycardia) (HCC)    Documented episode in the past, possibly reentrant tachycardia   Thyroid disease    Urinary incontinence     Past Surgical History:  Procedure Laterality Date   CATARACT EXTRACTION Left 8/11   CATARACT EXTRACTION Right 3/12   CHOLECYSTECTOMY  1994   Copsilotomy Laser Treatment Left 6/12   eye   ELBOW SURGERY  8/12   EYE SURGERY Left 09/2008   macular hole   EYE SURGERY Right 12/11   Lumbar Infusion  2011   MOLE REMOVAL  06/17/2015   TOTAL KNEE ARTHROPLASTY Left 6/11  TOTAL KNEE ARTHROPLASTY Right 06/13/2011    Allergies  Allergen Reactions   Keflex [Cephalexin] Diarrhea   Macrobid [Nitrofurantoin] Hives    Outpatient Encounter Medications as of 10/19/2021  Medication Sig   Acetaminophen 325 MG CHEW Chew 650 mg by mouth every 6 (six) hours as needed for pain.   bifidobacterium infantis (ALIGN) capsule Take 1 capsule by mouth daily.   Calcium Carb-Cholecalciferol (CALCIUM PLUS VITAMIN D3 PO) Take 1 tablet by mouth daily. Vitamin D3 + Calcium 600   ferrous sulfate 325 (65 FE) MG EC tablet Take 325 mg by mouth. Once A Day on Mon, Thu   furosemide (LASIX) 40 MG tablet Take 40 mg by  mouth 2 (two) times daily.   gabapentin (NEURONTIN) 300 MG capsule Take 300 mg by mouth at bedtime.   levothyroxine (SYNTHROID) 25 MCG tablet Take 50 mcg by mouth every other day.   levothyroxine (SYNTHROID) 25 MCG tablet Take 25 mcg by mouth every other day. Alternate with (2)tabs = 50 mcg; oral every other day   loperamide (IMODIUM) 2 MG capsule Take 2 mg by mouth every 4 (four) hours as needed.   mirtazapine (REMERON) 15 MG tablet Take 15 mg by mouth at bedtime.   nystatin (MYCOSTATIN/NYSTOP) powder Apply 1 application topically 2 (two) times daily. To genital areas and inner thigh   omeprazole (PRILOSEC) 20 MG capsule Take 20 mg by mouth daily.   ondansetron (ZOFRAN-ODT) 4 MG disintegrating tablet Take 4 mg by mouth every 8 (eight) hours as needed.   potassium chloride SA (KLOR-CON) 20 MEQ tablet Take 40 mEq by mouth daily.   Vitamin D, Ergocalciferol, 50 MCG (2000 UT) CAPS Take by mouth daily.   No facility-administered encounter medications on file as of 10/19/2021.    Review of Systems:  Review of Systems  Constitutional:  Negative for activity change and appetite change.  HENT: Negative.    Respiratory:  Negative for cough and shortness of breath.   Cardiovascular:  Negative for leg swelling.  Gastrointestinal:  Positive for abdominal pain. Negative for constipation.  Genitourinary: Negative.   Musculoskeletal:  Positive for gait problem. Negative for arthralgias and myalgias.  Skin: Negative.   Neurological:  Negative for dizziness and weakness.  Psychiatric/Behavioral:  Negative for confusion, dysphoric mood and sleep disturbance.     Health Maintenance  Topic Date Due   INFLUENZA VACCINE  11/23/2021   TETANUS/TDAP  11/25/2030   Pneumonia Vaccine 28+ Years old  Completed   DEXA SCAN  Completed   COVID-19 Vaccine  Completed   Zoster Vaccines- Shingrix  Completed   HPV VACCINES  Aged Out    Physical Exam: Vitals:   10/19/21 1426  BP: 126/74  Pulse: 76  Resp: 18   Temp: (!) 97.3 F (36.3 C)  SpO2: 96%  Weight: 259 lb 9.6 oz (117.8 kg)  Height: 5\' 8"  (1.727 m)   Body mass index is 39.47 kg/m. Physical Exam Vitals reviewed.  Constitutional:      Appearance: Normal appearance.  HENT:     Head: Normocephalic.     Nose: Nose normal.     Mouth/Throat:     Mouth: Mucous membranes are moist.     Pharynx: Oropharynx is clear.  Eyes:     Pupils: Pupils are equal, round, and reactive to light.  Cardiovascular:     Rate and Rhythm: Normal rate and regular rhythm.     Pulses: Normal pulses.     Heart sounds: Normal heart sounds. No murmur heard.  Pulmonary:     Effort: Pulmonary effort is normal.     Breath sounds: Normal breath sounds.  Abdominal:     General: Abdomen is flat. Bowel sounds are normal.     Palpations: Abdomen is soft.     Comments: Mild Tenderness in Left Lower Quadrant No Masses Felt  Musculoskeletal:        General: No swelling.     Cervical back: Neck supple.  Skin:    General: Skin is warm.  Neurological:     General: No focal deficit present.     Mental Status: She is alert and oriented to person, place, and time.  Psychiatric:        Mood and Affect: Mood normal.        Thought Content: Thought content normal.     Labs reviewed: Basic Metabolic Panel: Recent Labs    10/24/20 1308 02/08/21 0000 02/18/21 0000 02/25/21 0000 03/05/21 0000 03/24/21 0000 06/30/21 0000  NA 139   < > 135* 137 137  --  140  K 4.1   < > 4.6 3.2* 4.0  --  4.2  CL 110   < > 101 102 104  --  104  CO2 22   < > 26* 26*  --   --  26*  GLUCOSE 96  --   --   --   --   --   --   BUN 15   < > 41* 23* 22*  --  24*  CREATININE 0.98   < > 1.4* 1.2* 1.1  --  1.2*  CALCIUM 9.2   < > 9.1 8.9 8.5*  --  8.9  TSH  --   --   --   --   --  4.15 4.51   < > = values in this interval not displayed.   Liver Function Tests: Recent Labs    10/24/20 1308 02/08/21 0000 02/25/21 0000 03/05/21 0000 06/30/21 0000  AST 23   < > 64* 44* 15  ALT 32    < > 72* 47* 9  ALKPHOS 117   < > 85 80 81  BILITOT 0.6  --   --   --   --   PROT 7.6  --   --   --   --   ALBUMIN 3.6   < > 3.0* 2.7* 3.4*   < > = values in this interval not displayed.   Recent Labs    02/17/21 0000  LIPASE 22   No results for input(s): "AMMONIA" in the last 8760 hours. CBC: Recent Labs    10/24/20 1308 02/08/21 0000 02/15/21 0000 06/30/21 0000  WBC 9.3 8.2 10.4 5.2  NEUTROABS 7.0  --  5,741.00 2,392.00  HGB 10.6* 9.9* 10.7* 12.0  HCT 34.8* 31* 34* 37  MCV 83.7  --   --   --   PLT 430*  --  413* 265   Lipid Panel: No results for input(s): "CHOL", "HDL", "LDLCALC", "TRIG", "CHOLHDL", "LDLDIRECT" in the last 8760 hours. No results found for: "HGBA1C"  Procedures since last visit: No results found.  Assessment/Plan 1. Acute left lower quadrant pain Possible Diverticulosis or Diverticulitis Stat Labs CBC,CRP and CMP Will Reval after labs    Other issues  2. Edema of lower extremity On Lasix Creat stable   3. Stage 3a chronic kidney disease (HCC) Creat stable   4. Hypothyroidism, unspecified type TSH normal in 03/23   5. Gastroesophageal reflux disease with esophagitis without  hemorrhage On Prilosec   6. Insomnia secondary to depression with anxiety On Remeron   7. OSA (obstructive sleep apnea) Uses CPAP 8. Blood loss anemia On Low dose of iron HGB normal now   9. Weight gain If Continues to gain weight have to consider tapering Remeron off 10 Rheumatoid arthritis involving multiple sites,  Has been on Plaquenil before No Active symptoms right now   11 S/P Removal of Right Hip joint Not candidate for Replacement Non Weight bearing Uses Power Chair   Labs/tests ordered:  * No order type specified * Next appt:  Visit date not found

## 2021-10-21 ENCOUNTER — Encounter: Payer: Self-pay | Admitting: Adult Health

## 2021-10-21 ENCOUNTER — Non-Acute Institutional Stay (SKILLED_NURSING_FACILITY): Payer: Medicare Other | Admitting: Adult Health

## 2021-10-21 DIAGNOSIS — R1032 Left lower quadrant pain: Secondary | ICD-10-CM

## 2021-10-21 DIAGNOSIS — E039 Hypothyroidism, unspecified: Secondary | ICD-10-CM

## 2021-10-21 DIAGNOSIS — K2100 Gastro-esophageal reflux disease with esophagitis, without bleeding: Secondary | ICD-10-CM | POA: Diagnosis not present

## 2021-10-21 DIAGNOSIS — N1831 Chronic kidney disease, stage 3a: Secondary | ICD-10-CM

## 2021-10-21 NOTE — Progress Notes (Signed)
Location:  [facility] Room Number: N056/A Place of Service:  SNF (31) Provider:  Kenard Gower, DNP, FNP-BC  Patient Care Team: Mahlon Gammon, MD as PCP - General (Internal Medicine) [name], MD as PCP - Cardiology (Cardiology) [name], MD as PCP - Sleep Medicine (Cardiology) [name], MD (Sports Medicine) [name], MD (Ophthalmology) [name], [name], MD (Dermatology) [name], MD (Gastroenterology) [name], MD (Nephrology) [name], DC as Referring Physician (Chiropractic Medicine)  Extended Emergency Contact Information Primary Emergency Contact: [facility] Address: [address]          [place], [place] [address] [name] of [place] Home Phone: [phone] Mobile Phone: [phone] Relation: Spouse  Code Status:  DNR  Goals of care: Advanced Directive information    10/21/2021   10:21 AM  Advanced Directives  Does Patient Have a Medical Advance Directive? Yes  Type of Advance Directive Out of facility DNR (pink MOST or yellow form)  Does patient want to make changes to medical advance directive? No - Patient declined  Pre-existing out of facility DNR order (yellow form or pink MOST form) Pink MOST form placed in chart (order not valid for inpatient use)     Chief Complaint  Patient presents with   Acute Visit    LLQ pain    HPI:  Pt is a 81 y.o. female seen today for an acute visit to follow up on LLQ pain. She is a long-term care resident of [facility]. 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 The pain does not last long but rates pain as 10/10. Labs done 2 days ago showed  elevated CRP 26.5 but the rest of the  labs were not significant:  wbc 7.9, hemoglobin 13.5, hematocrit 40.1, platelet 281, creatinine 1.04, GFR 54, and glucose 96. Liver enzymes were within normal. No reported fever. No reported vomiting and denies having constipation nor dysuria. She uses a motorized wheelchair due to nonweightbearing status after removal of all of her previous right hip arthroplasty, thinning of the right acetabulum and not candidate for replacements.  Past Medical History:  Diagnosis Date   Benign hypertension with chronic kidney disease, stage III (HCC)    Overview:  Last Assessment & Plan:  Usually the patient is hypertensive. However today her blood pressure is 105 systolic. She has been feeling fatigued with some lightheadedness. Part of this may be related to her lower blood pressure. Her pressure may be improved with her decrease in salt and fluid intake. I've instructed her to put her lisinopril/hctz on hold. I've asked her to see her primary    Cardiac disease 03/10/2014   Chronic diarrhea 07/27/2015   Edema 07/27/2015   Ejection fraction 2004   Normal, echo,    Gastroesophageal reflux disease    GERD (gastroesophageal reflux disease)    Heart disease 03/10/2014   Hypertension    Left bundle branch block 09/01/2020   Obesity    Obesity (BMI 30-39.9) 09/25/2017   OSA (obstructive sleep apnea)    mild with AHI 6.75 - on CPAP   Preop cardiovascular exam 11/2010   Cardiac clearance for knee surgery    Sleep apnea 03/10/2014   Supraventricular tachycardia (HCC)  Documented episode in the past, possibly reentrant tachycardia  Overview:  Overview:  Documented episode in the past, 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 It was possibly reentrant. She does well with diltiazem. No change in therapy.   SVT (supraventricular tachycardia) (HCC)    Documented episode in the past, possibly reentrant tachycardia    Thyroid disease    Urinary incontinence    Past Surgical History:  Procedure Laterality Date   CATARACT EXTRACTION Left 8/11   CATARACT EXTRACTION Right 3/12   CHOLECYSTECTOMY  1994   Copsilotomy Laser Treatment Left 6/12   eye   ELBOW SURGERY  8/12   EYE SURGERY Left 09/2008   macular hole   EYE SURGERY Right 12/11   Lumbar Infusion  2011   MOLE REMOVAL  06/17/2015   TOTAL KNEE ARTHROPLASTY Left 6/11   TOTAL KNEE ARTHROPLASTY Right 06/13/2011    Allergies  Allergen Reactions   Keflex [Cephalexin] Diarrhea   Macrobid [Nitrofurantoin] Hives    Outpatient Encounter Medications as of 10/21/2021  Medication Sig   Acetaminophen 325 MG CHEW Chew 650 mg by mouth every 6 (six) hours as needed for pain.   bifidobacterium infantis (ALIGN) capsule Take 1 capsule by mouth daily.   Calcium Carb-Cholecalciferol (CALCIUM PLUS VITAMIN D3 PO) Take 1 tablet by mouth daily. Vitamin D3 + Calcium 600   ferrous sulfate 325 (65 FE) MG EC tablet Take 325 mg by mouth. Once A Day on Mon, Thu   furosemide (LASIX) 40 MG tablet Take 40 mg by mouth 2 (two) times daily.   gabapentin (NEURONTIN) 300 MG capsule Take 300 mg by mouth at bedtime.   levothyroxine (SYNTHROID) 25 MCG tablet Take 50 mcg by mouth every other day.   levothyroxine (SYNTHROID) 25 MCG tablet Take 25 mcg by mouth every other day. Alternate with (2)tabs = 50 mcg; oral every other day   loperamide (IMODIUM) 2 MG capsule Take 2 mg by mouth every 4 (four) hours as needed.   mirtazapine (REMERON) 15 MG tablet Take 15 mg by mouth at bedtime.   nystatin (MYCOSTATIN/NYSTOP) powder Apply 1 application  topically in the morning, at noon, in the evening, and at bedtime. To genital areas and inner thigh   omeprazole (PRILOSEC) 20 MG capsule Take 20 mg by mouth daily.   ondansetron (ZOFRAN-ODT) 4 MG disintegrating tablet Take 4 mg by mouth every 8 (eight) hours as needed.   potassium chloride SA (KLOR-CON) 20 MEQ tablet Take 40 mEq by mouth  daily.   Vitamin D, Ergocalciferol, 50 MCG (2000 UT) CAPS Take by mouth daily.   No facility-administered encounter medications on file as of 10/21/2021.    Review of Systems  Constitutional:  Negative for appetite change, chills, fatigue and fever.  HENT:  Negative for congestion, hearing loss, rhinorrhea and sore throat.   Eyes: Negative.   Respiratory:  Negative for cough, shortness of breath and wheezing.   Cardiovascular:  Negative for chest pain, palpitations and leg swelling.  Gastrointestinal:  Positive for abdominal pain. Negative for blood in stool, constipation, diarrhea, nausea and vomiting.  Genitourinary:  Negative for dysuria.  Musculoskeletal:  Negative for arthralgias, back pain and myalgias.  Skin:  Negative for color change, rash and wound.  Neurological:  Negative for dizziness, weakness and headaches.  Psychiatric/Behavioral:  Negative for behavioral problems. The patient is not nervous/anxious.        Immunization History  Administered Date(s) Administered   Influenza Split 01/28/2008, 02/17/2009, 02/20/2012, 02/11/2013, 02/02/2016, 01/11/2017, 02/04/2019   Influenza, High Dose Seasonal PF 02/04/2015, 01/11/2017, 01/30/2018, 02/05/2020   Moderna Sars-Covid-2 Vaccination 05/09/2019, 05/27/2019, 03/03/2020, 09/04/2020, 11/04/2020   Pfizer Covid-19 Vaccine Bivalent Booster 54yrs & up 02/09/2021   Pneumococcal Conjugate-13 08/30/2013   Pneumococcal Polysaccharide-23 11/03/2005   Td 08/01/2001   Tdap 09/22/2010, 11/24/2020   Zoster Recombinat (Shingrix) 11/08/2016, 01/11/2017   Zoster, Live 11/04/2005, 11/08/2016, 01/11/2017   Zoster, Unspecified 01/11/2017   Pertinent  Health Maintenance Due  Topic Date Due   INFLUENZA VACCINE  11/23/2021   DEXA SCAN  Completed      10/24/2020   11:31 AM  Fall Risk  Patient Fall Risk Level Moderate fall risk     Vitals:   10/21/21 1018  BP: 126/74  Pulse: 76  Resp: 18  Temp: 98 F (36.7 C)  SpO2: 97%  Weight:  259 lb 9.6 oz (117.8 kg)  Height:  (1.727 m)   Body mass index is 39.47 kg/m.  Physical Exam Constitutional:      General: She is not in acute distress.    Appearance: She is obese.  HENT:     Head: Normocephalic and atraumatic.     Nose: Nose normal.     Mouth/Throat:     Mouth: Mucous membranes are moist.  Eyes:     Conjunctiva/sclera: Conjunctivae normal.  Cardiovascular:     Rate and Rhythm: Normal rate and regular rhythm.  Pulmonary:     Effort: Pulmonary effort is normal.     Breath sounds: Normal breath sounds.  Abdominal:     General: Bowel sounds are normal.     Palpations: Abdomen is soft.  Musculoskeletal:     Cervical back: Normal range of motion.     Comments: RLE non weight bearing  Skin:    General: Skin is warm and dry.  Neurological:     General: No focal deficit present.     Mental Status: She is alert and oriented to person, place, and time.  Psychiatric:        Mood and Affect: Mood normal.        Behavior: Behavior normal.        Thought Content: Thought content normal.        Judgment: Judgment normal.       Labs reviewed: Recent Labs    10/24/20 1308 02/08/21 0000 02/25/21 0000 03/05/21 0000 06/30/21 0000 10/19/21 0000  NA 139   < > 137 137 140 139  K 4.1   < > 3.2* 4.0 4.2 4.5  CL 110   < > 102 104 104 103  CO2 22   < > 26*  --  26* 29*  GLUCOSE 96  --   --   --   --   --   BUN 15   < > 23* 22* 24* 24*  CREATININE 0.98   < > 1.2* 1.1 1.2* 1.0  CALCIUM 9.2   < > 8.9 8.5* 8.9 9.2   < > = values in this interval not displayed.   Recent Labs    10/24/20 1308 02/08/21 0000 03/05/21 0000 06/30/21 0000 10/19/21 0000  AST 23   < > 44* 15 14  ALT 32   < > 47* 9 12  ALKPHOS 117   < > 80 81 98  BILITOT 0.6  --   --   --   --   PROT 7.6  --   --   --   --  ALBUMIN 3.6   < > 2.7* 3.4* 3.8   < > = values in this interval not displayed.   Recent Labs    10/24/20 1308 02/08/21 0000 02/15/21 0000 06/30/21 0000  10/19/21 0000  WBC 9.3   < > 10.4 5.2 7.9  NEUTROABS 7.0  --  5,741.00 2,392.00 5,340.00  HGB 10.6*   < > 10.7* 12.0 13.5  HCT 34.8*   < > 34* 37 40  MCV 83.7  --   --   --   --   PLT 430*  --  413* 265 281   < > = values in this interval not displayed.   Lab Results  Component Value Date   TSH 4.51 06/30/2021   No results found for: "HGBA1C" No results found for: "CHOL", "HDL", "LDLCALC", "LDLDIRECT", "TRIG", "CHOLHDL"  Significant Diagnostic Results in last 30 days:  No results found.  Assessment/Plan  1. Stage 3a chronic kidney disease (HCC) -  gfr 54, creatinine 1.20 and BUN 24 -  stable  2. Acute left lower quadrant pain -  pain is not constant, labs did not show significant issues, no fever nor nausea and vomiting -   will continue to monitor  3. Hypothyroidism, unspecified type Lab Results  Component Value Date   TSH 4.51 06/30/2021   -  continue Levothyroxine  4. Gastroesophageal reflux disease with esophagitis without hemorrhage -  continue Omeprazole   Family/ staff Communication: Discussed plan of care with resident and charge nurse.  Labs/tests ordered:  will re-check CBC with differentials, CMP and CRP    Kenard Gower, DNP, MSN, FNP-BC [facility] [phone] (Monday-Friday 8:00 a.m. - 5:00 p.m.) [phone] (after hours)

## 2021-10-28 LAB — COMPREHENSIVE METABOLIC PANEL
Albumin: 3.3 — AB (ref 3.5–5.0)
Calcium: 8.9 (ref 8.7–10.7)
Globulin: 2.7
eGFR: 41

## 2021-10-28 LAB — BASIC METABOLIC PANEL
BUN: 30 — AB (ref 4–21)
CO2: 30 — AB (ref 13–22)
Chloride: 104 (ref 99–108)
Creatinine: 1.3 — AB (ref 0.5–1.1)
Glucose: 77
Potassium: 4.3 mEq/L (ref 3.5–5.1)
Sodium: 139 (ref 137–147)

## 2021-10-28 LAB — HEPATIC FUNCTION PANEL
ALT: 9 U/L (ref 7–35)
AST: 14 (ref 13–35)
Alkaline Phosphatase: 84 (ref 25–125)
Bilirubin, Total: 0.4

## 2021-10-28 LAB — CBC AND DIFFERENTIAL
HCT: 35 — AB (ref 36–46)
Hemoglobin: 11.4 — AB (ref 12.0–16.0)
Neutrophils Absolute: 3063
Platelets: 255 10*3/uL (ref 150–400)
WBC: 6.2

## 2021-10-28 LAB — CBC: RBC: 4.11 (ref 3.87–5.11)

## 2022-01-17 ENCOUNTER — Encounter: Payer: Self-pay | Admitting: Nurse Practitioner

## 2022-01-17 ENCOUNTER — Non-Acute Institutional Stay (SKILLED_NURSING_FACILITY): Payer: Medicare Other | Admitting: Nurse Practitioner

## 2022-01-17 DIAGNOSIS — T8451XS Infection and inflammatory reaction due to internal right hip prosthesis, sequela: Secondary | ICD-10-CM

## 2022-01-17 DIAGNOSIS — F5105 Insomnia due to other mental disorder: Secondary | ICD-10-CM

## 2022-01-17 DIAGNOSIS — F418 Other specified anxiety disorders: Secondary | ICD-10-CM

## 2022-01-17 DIAGNOSIS — B372 Candidiasis of skin and nail: Secondary | ICD-10-CM

## 2022-01-17 DIAGNOSIS — I10 Essential (primary) hypertension: Secondary | ICD-10-CM

## 2022-01-17 DIAGNOSIS — G4733 Obstructive sleep apnea (adult) (pediatric): Secondary | ICD-10-CM

## 2022-01-17 DIAGNOSIS — R600 Localized edema: Secondary | ICD-10-CM

## 2022-01-17 DIAGNOSIS — M069 Rheumatoid arthritis, unspecified: Secondary | ICD-10-CM

## 2022-01-17 DIAGNOSIS — D500 Iron deficiency anemia secondary to blood loss (chronic): Secondary | ICD-10-CM | POA: Diagnosis not present

## 2022-01-17 DIAGNOSIS — K529 Noninfective gastroenteritis and colitis, unspecified: Secondary | ICD-10-CM

## 2022-01-17 DIAGNOSIS — K2100 Gastro-esophageal reflux disease with esophagitis, without bleeding: Secondary | ICD-10-CM

## 2022-01-17 DIAGNOSIS — E039 Hypothyroidism, unspecified: Secondary | ICD-10-CM

## 2022-01-17 DIAGNOSIS — N1831 Chronic kidney disease, stage 3a: Secondary | ICD-10-CM

## 2022-01-17 NOTE — Assessment & Plan Note (Signed)
takes Levothyroxine, TSH 4.51 06/30/21 

## 2022-01-17 NOTE — Assessment & Plan Note (Signed)
post op, s/p EGD no active bleed. S/p 6 u PRBC transfusion, ASA was dc'd, On Fe, Hgb 13.5 10/19/21

## 2022-01-17 NOTE — Assessment & Plan Note (Signed)
prn Imodium, Align

## 2022-01-17 NOTE — Assessment & Plan Note (Signed)
Blood pressure is controlled, takes Furosemide.

## 2022-01-17 NOTE — Assessment & Plan Note (Signed)
itching beefy redness in urogenital and groins/lower abd skin folds, Nystatin powder is not effective. Apply 0.1% Triamcinolone + Nystatin cream bid to reddened areas x 10 days.

## 2022-01-17 NOTE — Progress Notes (Unsigned)
Location:   SNF [facility] Room Number: 056/A Place of Service:  SNF (31) Provider: [facility] Rika Daughdrill NP  Mahlon Gammon, MD  Patient Care Team: Mahlon Gammon, MD as PCP - General (Internal Medicine) [name], MD as PCP - Cardiology (Cardiology) [name], MD as PCP - Sleep Medicine (Cardiology) [name], MD (Sports Medicine) [name], MD (Ophthalmology) [name], [name], MD (Dermatology) [name], MD (Gastroenterology) [name], MD (Nephrology) [name], DC as Referring Physician (Chiropractic Medicine)  Extended Emergency Contact Information Primary Emergency Contact: [facility] Address: [address]          [place], [place] [address] [name] of [place] Home Phone: [phone] Mobile Phone: [phone] Relation: Spouse  Code Status: DNR Goals of care: Advanced Directive information    01/18/2022    9:55 AM  Advanced Directives  Does Patient Have a Medical Advance Directive? Yes  Type of Advance Directive Out of facility DNR (pink MOST or yellow form)  Does patient want to make changes to medical advance directive? No - Patient declined  Pre-existing out of facility DNR order (yellow form or pink MOST form) Yellow form placed in chart (order not valid for inpatient use)     Chief Complaint  Patient presents with   Acute Visit    Rash in urogenital and groin    HPI:  Pt is a 81 y.o. female seen today for an acute visit for itching beefy redness in urogenital and groins/lower abd skin folds, Nystatin powder is not effective.    Elevated AST/ALT normalized, S/p cholecystectomy, [place] 02/19/21 no cyst or mass.               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 F/u Ortho. TDWB. 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 Fully treated, off antibiotics.              Anemia, post op, s/p EGD no active  bleed. S/p 6 u PRBC transfusion, ASA was dc'd, On Fe, Hgb 13.5 10/19/21             HTN only on Furosemide.              Morbid obesity             OSA CPAP             Chronic diarrhea, prn Imodium, Align             RA f/u Rheumatology, hx of Plaquenil use, stable presently.              Insomnia/depression/anxiety, takes Mirtazapine             GERD, stable, on Omeprazole, prn Zofran.              Edema, not apparent, takes Furosemide  CKD stage 3 Bun/creat 24/1.0 10/19/21             Hypothyroidism, takes Levothyroxine, TSH 4.51 06/30/21    Past Medical History:  Diagnosis Date   Benign hypertension with chronic kidney disease, stage III (HCC)    Overview:  Last Assessment & Plan:  Usually the patient is hypertensive. However today her blood pressure is 105 systolic. She has been feeling fatigued with some lightheadedness. Part of this may be related to her lower blood pressure. Her pressure  may be improved with her decrease in salt and fluid intake. I've instructed her to put her lisinopril/hctz on hold. I've asked her to see her primary    Cardiac disease 03/10/2014   Chronic diarrhea 07/27/2015   Edema 07/27/2015   Ejection fraction 2004   Normal, echo,    Gastroesophageal reflux disease    GERD (gastroesophageal reflux disease)    Heart disease 03/10/2014   Hypertension    Left bundle branch block 09/01/2020   Obesity    Obesity (BMI 30-39.9) 09/25/2017   OSA (obstructive sleep apnea)    mild with AHI 6.75 - on CPAP   Preop cardiovascular exam 11/2010   Cardiac clearance for knee surgery    Sleep apnea 03/10/2014   Supraventricular tachycardia ([place])    Documented episode in the past, possibly reentrant tachycardia  Overview:  Overview:  Documented episode in the past, 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 It was possibly reentrant. She does well with diltiazem. No change in  therapy.   SVT (supraventricular tachycardia) ([place])    Documented episode in the past, possibly reentrant tachycardia   Thyroid disease    Urinary incontinence    Past Surgical History:  Procedure Laterality Date   CATARACT EXTRACTION Left 8/11   CATARACT EXTRACTION Right 3/12   CHOLECYSTECTOMY  1994   Copsilotomy Laser Treatment Left 6/12   eye   ELBOW SURGERY  8/12   EYE SURGERY Left 09/2008   macular hole   EYE SURGERY Right 12/11   Lumbar Infusion  2011   MOLE REMOVAL  06/17/2015   TOTAL KNEE ARTHROPLASTY Left 6/11   TOTAL KNEE ARTHROPLASTY Right 06/13/2011    Allergies  Allergen Reactions   Keflex [Cephalexin] Diarrhea   Macrobid [Nitrofurantoin] Hives    Allergies as of 01/17/2022       Reactions   Keflex [cephalexin] Diarrhea   Macrobid [nitrofurantoin] Hives        Medication List        Accurate as of January 17, 2022 11:59 PM. If you have any questions, ask your nurse or doctor.          Acetaminophen 325 MG Chew Chew 650 mg by mouth every 6 (six) hours as needed for pain.   bifidobacterium infantis capsule Take 1 capsule by mouth daily.   CALCIUM PLUS VITAMIN D3 PO Take 1 tablet by mouth daily. Vitamin D3 71mcg + Calcium 600   ferrous sulfate 325 (65 FE) MG EC tablet Take 325 mg by mouth. Once A Day on Mon, Thu   furosemide 40 MG tablet Commonly known as: LASIX Take 40 mg by mouth 2 (two) times daily.   gabapentin 300 MG capsule Commonly known as: NEURONTIN Take 300 mg by mouth at bedtime.   levothyroxine 25 MCG tablet Commonly known as: SYNTHROID Take 25 mcg by mouth every other day. Alternate with (2)tabs = 50 mcg; oral every other day   levothyroxine 25 MCG tablet Commonly known as: SYNTHROID Take 50 mcg by mouth every other day.   loperamide 2 MG capsule Commonly known as: IMODIUM Take 2 mg by mouth every 4 (four) hours as needed.   mirtazapine 15 MG tablet Commonly known as: REMERON Take 15 mg by mouth at bedtime.    nystatin powder Commonly known as: MYCOSTATIN/NYSTOP Apply 1 application  topically in the morning, at noon, in the evening, and at bedtime. To genital areas and  inner thigh   omeprazole 20 MG capsule Commonly known as: PRILOSEC Take 20 mg by mouth daily.   ondansetron 4 MG disintegrating tablet Commonly known as: ZOFRAN-ODT Take 4 mg by mouth every 8 (eight) hours as needed.   potassium chloride SA 20 MEQ tablet Commonly known as: KLOR-CON M Take 40 mEq by mouth daily.   Vitamin D (Ergocalciferol) 50 MCG (2000 UT) Caps Take by mouth daily.        Review of Systems  Constitutional:  Negative for fatigue, fever and unexpected weight change.  HENT:  Negative for congestion and trouble swallowing.   Eyes:  Negative for visual disturbance.  Respiratory:  Negative for cough.   Cardiovascular:  Negative for leg swelling.  Gastrointestinal:  Negative for abdominal pain and constipation.  Genitourinary:  Negative for dysuria, frequency and urgency.  Musculoskeletal:  Positive for arthralgias and gait problem.  Skin:  Positive for rash. Negative for color change.  Neurological:  Negative for speech difficulty, weakness and headaches.  Psychiatric/Behavioral:  Negative for confusion and sleep disturbance. The patient is not nervous/anxious.     Immunization History  Administered Date(s) Administered   Influenza Split 01/28/2008, 02/17/2009, 02/20/2012, 02/11/2013, 02/02/2016, 01/11/2017, 02/04/2019   Influenza, High Dose Seasonal PF 02/04/2015, 01/11/2017, 01/30/2018, 02/05/2020   Moderna Covid-19 Vaccine Bivalent Booster 75yrs & up 09/10/2021   Moderna Sars-Covid-2 Vaccination 05/09/2019, 05/27/2019, 03/03/2020, 09/04/2020, 11/04/2020   Pfizer Covid-19 Vaccine Bivalent Booster 46yrs & up 02/09/2021   Pneumococcal Conjugate-13 08/30/2013   Pneumococcal Polysaccharide-23 11/03/2005   Td 08/01/2001, 09/05/2019   Tdap 09/22/2010, 11/24/2020   Zoster Recombinat (Shingrix)  11/08/2016, 01/11/2017   Zoster, Live 11/04/2005, 11/08/2016, 01/11/2017   Zoster, Unspecified 01/11/2017   Pertinent  Health Maintenance Due  Topic Date Due   INFLUENZA VACCINE  11/23/2021   DEXA SCAN  Completed      10/24/2020   11:31 AM  Fall Risk  Patient Fall Risk Level Moderate fall risk   Functional Status Survey:    Vitals:   01/18/22 0935  BP: 110/66  Pulse: 66  Resp: 18  Temp: 98 F (36.7 C)  SpO2: 94%  Weight: 267 lb 3.2 oz (121.2 kg)  Height: 5\' 8"  (1.727 m)   Body mass index is 40.63 kg/m. Physical Exam Vitals and nursing note reviewed.  Constitutional:      Appearance: She is obese.  HENT:     Head: Normocephalic and atraumatic.     Mouth/Throat:     Mouth: Mucous membranes are moist.  Eyes:     Extraocular Movements: Extraocular movements intact.     Conjunctiva/sclera: Conjunctivae normal.     Pupils: Pupils are equal, round, and reactive to light.  Cardiovascular:     Rate and Rhythm: Normal rate and regular rhythm.     Heart sounds: No murmur heard. Pulmonary:     Effort: Pulmonary effort is normal.  Abdominal:     General: Bowel sounds are normal.     Palpations: Abdomen is soft.     Tenderness: There is no abdominal tenderness. There is no right CVA tenderness, left CVA tenderness, guarding or rebound.  Genitourinary:    Comments: No urogenital irritation or skin breakdown Musculoskeletal:     Cervical back: Normal range of motion and neck supple.     Right lower leg: No edema.     Left lower leg: No edema.     Comments: R hip, prosthetic hip removed.   Skin:    General: Skin is warm and dry.  Findings: Rash present.     Comments: Right hip surgical scar. Redness in the lower abd skin folds, groins, urogenital areas, itching  Neurological:     General: No focal deficit present.     Mental Status: She is alert and oriented to person, place, and time.     Gait: Gait normal.  Psychiatric:        Mood and Affect: Mood normal.         Behavior: Behavior normal.        Thought Content: Thought content normal.        Judgment: Judgment normal.     Labs reviewed: Recent Labs    06/30/21 0000 10/19/21 0000 10/28/21 0000  NA 140 139 139  K 4.2 4.5 4.3  CL 104 103 104  CO2 26* 29* 30*  BUN 24* 24* 30*  CREATININE 1.2* 1.0 1.3*  CALCIUM 8.9 9.2 8.9   Recent Labs    06/30/21 0000 10/19/21 0000 10/28/21 0000  AST 15 14 14   ALT 9 12 9   ALKPHOS 81 98 84  ALBUMIN 3.4* 3.8 3.3*   Recent Labs    06/30/21 0000 10/19/21 0000 10/28/21 0000  WBC 5.2 7.9 6.2  NEUTROABS 2,392.00 5,340.00 3,063.00  HGB 12.0 13.5 11.4*  HCT 37 40 35*  PLT 265 281 255   Lab Results  Component Value Date   TSH 4.51 06/30/2021   No results found for: "HGBA1C" No results found for: "CHOL", "HDL", "LDLCALC", "LDLDIRECT", "TRIG", "CHOLHDL"  Significant Diagnostic Results in last 30 days:  No results found.  Assessment/Plan: Candidiasis of skin itching beefy redness in urogenital and groins/lower abd skin folds, Nystatin powder is not effective. Apply 0.1% Triamcinolone + Nystatin cream bid to reddened areas x 10 days.   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HCC) R hip pain, s/p right THR 2018, removal of hardware 12/29/20,  takes Tylenol, Gabapentin. F/u Ortho. TDWB. 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 Fully treated, off antibiotics.   Blood loss anemia post op, s/p EGD no active bleed. S/p 6 u PRBC transfusion, ASA was dc'd, On Fe, Hgb 13.5 10/19/21  Benign essential HTN Blood pressure is controlled, takes Furosemide.   OSA (obstructive sleep apnea) CPAP  Chronic diarrhea prn Imodium, Align  Rheumatoid arthritis (HCC)   RA f/u Rheumatology, hx of Plaquenil use, stable presently.   Insomnia secondary to depression with anxiety  Insomnia/depression/anxiety, takes Mirtazapine  Gastroesophageal reflux disease stable, on Omeprazole, prn Zofran.   Edema of lower extremity not  apparent, takes Furosemide   CKD (chronic kidney disease) stage 3, GFR 30-59 ml/min (HCC) Bun/creat 24/1.0 10/19/21  Hypothyroidism takes Levothyroxine, TSH 4.51 06/30/21    Family/ staff Communication: plan of care reviewed with the patient and charge nurse.   Labs/tests ordered:  none  Time spend 25 minutes.

## 2022-01-17 NOTE — Assessment & Plan Note (Signed)
stable, on Omeprazole, prn Zofran.  

## 2022-01-17 NOTE — Assessment & Plan Note (Signed)
not apparent, takes Furosemide  

## 2022-01-17 NOTE — Assessment & Plan Note (Signed)
Insomnia/depression/anxiety, takes Mirtazapine 

## 2022-01-17 NOTE — Assessment & Plan Note (Signed)
CPAP.  

## 2022-01-17 NOTE — Assessment & Plan Note (Signed)
RA f/u Rheumatology, hx of Plaquenil use, stable presently.  

## 2022-01-17 NOTE — Assessment & Plan Note (Signed)
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 F/u Ortho. TDWB. 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 Fully treated, off antibiotics.

## 2022-01-17 NOTE — Assessment & Plan Note (Signed)
Bun/creat 24/1.0 10/19/21

## 2022-01-18 ENCOUNTER — Encounter: Payer: Self-pay | Admitting: Nurse Practitioner

## 2022-01-18 NOTE — Progress Notes (Unsigned)
Location:[facility]      Place of Service: Skilled Nursing Facility [facility]    Provider: Man X Mast NP   Mahlon Gammon, MD  Patient Care Team: Mahlon Gammon, MD as PCP - General (Internal Medicine) [name], MD as PCP - Cardiology (Cardiology) [name], MD as PCP - Sleep Medicine (Cardiology) [name], MD (Sports Medicine) [name], MD (Ophthalmology) [name], [name], MD (Dermatology) [name], MD (Gastroenterology) [name], MD (Nephrology) [name], DC as Referring Physician (Chiropractic Medicine)  Extended Emergency Contact Information Primary Emergency Contact: [facility] Address: [address]          [place], [place] [address] [name] of [place] Home Phone: [phone] Mobile Phone: [phone] Relation: Spouse  Code Status: DNR   Managed Care  Goals of care: Advanced Directive information    01/18/2022    9:55 AM  Advanced Directives  Does Patient Have a Medical Advance Directive? Yes  Type of Advance Directive Out of facility DNR (pink MOST or yellow form)  Does patient want to make changes to medical advance directive? No - Patient declined  Pre-existing out of facility DNR order (yellow form or pink MOST form) Yellow form placed in chart (order not valid for inpatient use)     No chief complaint on file.   HPI:  Pt is a 81 y.o. female seen today for an acute visit for    Past Medical History:  Diagnosis Date   Benign hypertension with chronic kidney disease, stage III (HCC)    Overview:  Last Assessment & Plan:  Usually the patient is hypertensive. However today her blood pressure is 105 systolic. She has been feeling fatigued with some lightheadedness. Part of this may be related to her lower blood pressure. Her pressure may be improved with her decrease in salt and fluid intake. I've instructed her to put her lisinopril/hctz on hold. I've asked her to see  her primary    Cardiac disease 03/10/2014   Chronic diarrhea 07/27/2015   Edema 07/27/2015   Ejection fraction 2004   Normal, echo,    Gastroesophageal reflux disease    GERD (gastroesophageal reflux disease)    Heart disease 03/10/2014   Hypertension    Left bundle branch block 09/01/2020   Obesity    Obesity (BMI 30-39.9) 09/25/2017   OSA (obstructive sleep apnea)    mild with AHI 6.75 - on CPAP   Preop cardiovascular exam 11/2010   Cardiac clearance for knee surgery    Sleep apnea 03/10/2014   Supraventricular tachycardia (HCC)    Documented episode in the past, possibly reentrant tachycardia  Overview:  Overview:  Documented episode in the past, 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 It was possibly reentrant. She does well with diltiazem. No change in therapy.   SVT (supraventricular tachycardia) (HCC)    Documented episode in the past, possibly reentrant tachycardia   Thyroid disease    Urinary incontinence    Past Surgical History:  Procedure Laterality Date   CATARACT EXTRACTION Left 8/11   CATARACT EXTRACTION Right 3/12   CHOLECYSTECTOMY  1994   Copsilotomy Laser Treatment Left 6/12   eye   ELBOW SURGERY  8/12   EYE SURGERY Left 09/2008   macular hole   EYE SURGERY Right 12/11   Lumbar Infusion  2011   MOLE REMOVAL  06/17/2015  TOTAL KNEE ARTHROPLASTY Left 6/11   TOTAL KNEE ARTHROPLASTY Right 06/13/2011    Allergies  Allergen Reactions   Keflex [Cephalexin] Diarrhea   Macrobid [Nitrofurantoin] Hives    Allergies as of 01/17/2022       Reactions   Keflex [cephalexin] Diarrhea   Macrobid [nitrofurantoin] Hives        Medication List        Accurate as of January 17, 2022 11:59 PM. If you have any questions, ask your nurse or doctor.          Acetaminophen 325 MG Chew Chew 650 mg by mouth every 6 (six) hours as needed for pain.    bifidobacterium infantis capsule Take 1 capsule by mouth daily.   CALCIUM PLUS VITAMIN D3 PO Take 1 tablet by mouth daily. Vitamin D3 19mcg + Calcium 600   ferrous sulfate 325 (65 FE) MG EC tablet Take 325 mg by mouth. Once A Day on Mon, Thu   furosemide 40 MG tablet Commonly known as: LASIX Take 40 mg by mouth 2 (two) times daily.   gabapentin 300 MG capsule Commonly known as: NEURONTIN Take 300 mg by mouth at bedtime.   levothyroxine 25 MCG tablet Commonly known as: SYNTHROID Take 25 mcg by mouth every other day. Alternate with (2)tabs = 50 mcg; oral every other day   levothyroxine 25 MCG tablet Commonly known as: SYNTHROID Take 50 mcg by mouth every other day.   loperamide 2 MG capsule Commonly known as: IMODIUM Take 2 mg by mouth every 4 (four) hours as needed.   mirtazapine 15 MG tablet Commonly known as: REMERON Take 15 mg by mouth at bedtime.   nystatin powder Commonly known as: MYCOSTATIN/NYSTOP Apply 1 application  topically in the morning, at noon, in the evening, and at bedtime. To genital areas and inner thigh   omeprazole 20 MG capsule Commonly known as: PRILOSEC Take 20 mg by mouth daily.   ondansetron 4 MG disintegrating tablet Commonly known as: ZOFRAN-ODT Take 4 mg by mouth every 8 (eight) hours as needed.   potassium chloride SA 20 MEQ tablet Commonly known as: KLOR-CON M Take 40 mEq by mouth daily.   Vitamin D (Ergocalciferol) 50 MCG (2000 UT) Caps Take by mouth daily.        Review of Systems  Immunization History  Administered Date(s) Administered   Influenza Split 01/28/2008, 02/17/2009, 02/20/2012, 02/11/2013, 02/02/2016, 01/11/2017, 02/04/2019   Influenza, High Dose Seasonal PF 02/04/2015, 01/11/2017, 01/30/2018, 02/05/2020   Moderna Covid-19 Vaccine Bivalent Booster 20yrs & up 09/10/2021   Moderna Sars-Covid-2 Vaccination 05/09/2019, 05/27/2019, 03/03/2020, 09/04/2020, 11/04/2020   Pfizer Covid-19 Vaccine Bivalent Booster  61yrs & up 02/09/2021   Pneumococcal Conjugate-13 08/30/2013   Pneumococcal Polysaccharide-23 11/03/2005   Td 08/01/2001, 09/05/2019   Tdap 09/22/2010, 11/24/2020   Zoster Recombinat (Shingrix) 11/08/2016, 01/11/2017   Zoster, Live 11/04/2005, 11/08/2016, 01/11/2017   Zoster, Unspecified 01/11/2017   Pertinent  Health Maintenance Due  Topic Date Due   INFLUENZA VACCINE  11/23/2021   DEXA SCAN  Completed      10/24/2020   11:31 AM  Fall Risk  Patient Fall Risk Level Moderate fall risk   Functional Status Survey:    There were no vitals filed for this visit. There is no height or weight on file to calculate BMI. Physical Exam  Labs reviewed: Recent Labs    06/30/21 0000 10/19/21 0000 10/28/21 0000  NA 140 139 139  K 4.2 4.5 4.3  CL 104 103 104  CO2 26* 29* 30*  BUN 24* 24* 30*  CREATININE 1.2* 1.0 1.3*  CALCIUM 8.9 9.2 8.9   Recent Labs    06/30/21 0000 10/19/21 0000 10/28/21 0000  AST 15 14 14   ALT 9 12 9   ALKPHOS 81 98 84  ALBUMIN 3.4* 3.8 3.3*   Recent Labs    06/30/21 0000 10/19/21 0000 10/28/21 0000  WBC 5.2 7.9 6.2  NEUTROABS 2,392.00 5,340.00 3,063.00  HGB 12.0 13.5 11.4*  HCT 37 40 35*  PLT 265 281 255   Lab Results  Component Value Date   TSH 4.51 06/30/2021   No results found for: "HGBA1C" No results found for: "CHOL", "HDL", "LDLCALC", "LDLDIRECT", "TRIG", "CHOLHDL"  Significant Diagnostic Results in last 30 days:  No results found.  Assessment/Plan There are no diagnoses linked to this encounter.   Family/ staff Communication:   Labs/tests ordered:

## 2022-01-20 NOTE — Progress Notes (Signed)
This encounter was created in error - please disregard.

## 2022-01-27 ENCOUNTER — Encounter: Payer: Self-pay | Admitting: Nurse Practitioner

## 2022-01-27 DIAGNOSIS — Z89621 Acquired absence of right hip joint: Secondary | ICD-10-CM | POA: Insufficient documentation

## 2022-01-27 DIAGNOSIS — R269 Unspecified abnormalities of gait and mobility: Secondary | ICD-10-CM | POA: Insufficient documentation

## 2022-02-08 ENCOUNTER — Non-Acute Institutional Stay (SKILLED_NURSING_FACILITY): Payer: Medicare Other | Admitting: Family Medicine

## 2022-02-08 DIAGNOSIS — N1831 Chronic kidney disease, stage 3a: Secondary | ICD-10-CM | POA: Diagnosis not present

## 2022-02-08 DIAGNOSIS — E669 Obesity, unspecified: Secondary | ICD-10-CM | POA: Diagnosis not present

## 2022-02-08 DIAGNOSIS — Z89621 Acquired absence of right hip joint: Secondary | ICD-10-CM

## 2022-02-08 NOTE — Progress Notes (Signed)
Provider:  Jacalyn Lefevre, MD Location:      Place of Service:     PCP: Mahlon Gammon, MD Patient Care Team: Mahlon Gammon, MD as PCP - General (Internal Medicine) [name], MD as PCP - Cardiology (Cardiology) [name], MD as PCP - Sleep Medicine (Cardiology) [name], MD (Sports Medicine) [name], MD (Ophthalmology) [name], [name], MD (Dermatology) [name], MD (Gastroenterology) [name], MD (Nephrology) [name], DC as Referring Physician (Chiropractic Medicine)  Extended Emergency Contact Information Primary Emergency Contact: [facility] Address: [address]          [place], [place] [address] [name] of [place] Home Phone: [phone] Mobile Phone: [phone] Relation: Spouse  Code Status:  Goals of Care: Advanced Directive information    01/18/2022    9:55 AM  Advanced Directives  Does Patient Have a Medical Advance Directive? Yes  Type of Advance Directive Out of facility DNR (pink MOST or yellow form)  Does patient want to make changes to medical advance directive? No - Patient declined  Pre-existing out of facility DNR order (yellow form or pink MOST form) Yellow form placed in chart (order not valid for inpatient use)      No chief complaint on file.   HPI: Patient is a 81 y.o. 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  Since that time she has been in skilled care.  She is unable to ambulate since she has no hip joint and gets around in a motorized wheelchair.  She spends very little time in her room. She denies any symptoms or problems today.  Bowel and bladder function is normal.  There is no pain.  Appetite  is good sleep is good her husband lives in independent living at this facility  Past Medical History:  Diagnosis Date   Benign hypertension with chronic kidney disease, stage III (HCC)    Overview:  Last Assessment & Plan:  Usually the patient is hypertensive. However today her blood pressure is 105 systolic. She has been feeling fatigued with some lightheadedness. Part of this may be related to her lower blood pressure. Her pressure may be improved with her decrease in salt and fluid intake. I've instructed her to put her lisinopril/hctz on hold. 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 It was possibly reentrant. She does well with diltiazem. No change in  therapy.   SVT (supraventricular tachycardia) (HCC)    Documented episode in the past, possibly reentrant tachycardia   Thyroid disease    Urinary incontinence    Past Surgical History:  Procedure Laterality Date   CATARACT EXTRACTION Left 8/11   CATARACT EXTRACTION Right 3/12   CHOLECYSTECTOMY  1994   Copsilotomy Laser Treatment Left 6/12   eye   ELBOW SURGERY  8/12   EYE SURGERY Left 09/2008   macular hole   EYE SURGERY Right 12/11   Lumbar Infusion  2011    MOLE REMOVAL  06/17/2015   TOTAL KNEE ARTHROPLASTY Left 6/11   TOTAL KNEE ARTHROPLASTY Right 06/13/2011    reports that she has quit smoking. She has never used smokeless tobacco. She reports that she does not drink alcohol and does not use drugs. Social History   Socioeconomic History   Marital status: Married    Spouse name: Not on file   Number of children: Not on file   Years of education: Not on file   Highest education level: Not on file  Occupational History   Not on file  Tobacco Use   Smoking status: Former   Smokeless tobacco: Never  Vaping Use   Vaping Use: Never used  Substance and Sexual Activity   Alcohol use: No   Drug use: No   Sexual activity: Not on file  Other Topics Concern   Not on file  Social History Narrative   Not on file   Social Determinants of Health   Financial Resource Strain: Not on file  Food Insecurity: Not on file  Transportation Needs: Not on file  Physical Activity: Not on file  Stress: Not on file  Social Connections: Not on file  Intimate Partner Violence: Not on file    Functional Status Survey:    Family History  Problem Relation Age of Onset   High blood pressure Mother    Diabetes Mother    Heart Problems Father    Diabetes Father    Prostate cancer Father    Heart attack Father    Heart attack Paternal Grandmother    Heart attack Maternal Grandfather    Heart attack Paternal Uncle    Stroke Neg Hx     Health Maintenance  Topic Date Due   INFLUENZA VACCINE  11/23/2021   TETANUS/TDAP  11/25/2030   Pneumonia Vaccine 58+ Years old  Completed   DEXA SCAN  Completed   COVID-19 Vaccine  Completed   Zoster Vaccines- Shingrix  Completed   HPV VACCINES  Aged Out    Allergies  Allergen Reactions   Keflex [Cephalexin] Diarrhea   Macrobid [Nitrofurantoin] Hives    Outpatient Encounter Medications as of 02/08/2022  Medication Sig   Acetaminophen 325 MG CHEW Chew 650 mg by mouth every 6 (six) hours as needed for  pain.   bifidobacterium infantis (ALIGN) capsule Take 1 capsule by mouth daily.   Calcium Carb-Cholecalciferol (CALCIUM PLUS VITAMIN D3 PO) Take 1 tablet by mouth daily. Vitamin D3 67mcg + Calcium 600   ferrous sulfate 325 (65 FE) MG EC tablet Take 325 mg by mouth. Once A Day on Mon, Thu   furosemide (LASIX) 40 MG tablet Take 40 mg by mouth 2 (two) times daily.   gabapentin (NEURONTIN) 300 MG capsule Take 300 mg by mouth at bedtime.   levothyroxine (SYNTHROID) 25 MCG tablet Take 50 mcg by mouth every other day.   levothyroxine (SYNTHROID) 25 MCG tablet Take 25 mcg by mouth every other  day. Alternate with (2)tabs = 50 mcg; oral every other day   loperamide (IMODIUM) 2 MG capsule Take 2 mg by mouth every 4 (four) hours as needed.   mirtazapine (REMERON) 15 MG tablet Take 15 mg by mouth at bedtime.   nystatin (MYCOSTATIN/NYSTOP) powder Apply 1 application  topically in the morning, at noon, in the evening, and at bedtime. To genital areas and inner thigh   omeprazole (PRILOSEC) 20 MG capsule Take 20 mg by mouth daily.   ondansetron (ZOFRAN-ODT) 4 MG disintegrating tablet Take 4 mg by mouth every 8 (eight) hours as needed.   potassium chloride SA (KLOR-CON) 20 MEQ tablet Take 40 mEq by mouth daily.   Vitamin D, Ergocalciferol, 50 MCG (2000 UT) CAPS Take by mouth daily.   No facility-administered encounter medications on file as of 02/08/2022.    Review of Systems  Constitutional: Negative.   HENT: Negative.    Eyes: Negative.   Respiratory: Negative.    Cardiovascular: Negative.   Gastrointestinal: Negative.   Genitourinary: Negative.   Musculoskeletal: Negative.   Neurological: Negative.   All other systems reviewed and are negative.   There were no vitals filed for this visit. There is no height or weight on file to calculate BMI. Physical Exam Vitals and nursing note reviewed.  Constitutional:      Appearance: She is obese.  HENT:     Head: Normocephalic.     Mouth/Throat:      Mouth: Mucous membranes are moist.     Pharynx: Oropharynx is clear.  Eyes:     Extraocular Movements: Extraocular movements intact.     Pupils: Pupils are equal, round, and reactive to light.  Cardiovascular:     Rate and Rhythm: Normal rate and regular rhythm.  Pulmonary:     Effort: Pulmonary effort is normal.     Breath sounds: Normal breath sounds.  Abdominal:     General: Bowel sounds are normal.  Musculoskeletal:     Right lower leg: No edema.     Left lower leg: No edema.  Skin:    General: Skin is warm and dry.  Neurological:     General: No focal deficit present.     Mental Status: She is alert and oriented to person, place, and time.  Psychiatric:        Mood and Affect: Mood normal.        Behavior: Behavior normal.     Labs reviewed: Basic Metabolic Panel: Recent Labs    06/30/21 0000 10/19/21 0000 10/28/21 0000  NA 140 139 139  K 4.2 4.5 4.3  CL 104 103 104  CO2 26* 29* 30*  BUN 24* 24* 30*  CREATININE 1.2* 1.0 1.3*  CALCIUM 8.9 9.2 8.9   Liver Function Tests: Recent Labs    06/30/21 0000 10/19/21 0000 10/28/21 0000  AST 15 14 14   ALT 9 12 9   ALKPHOS 81 98 84  ALBUMIN 3.4* 3.8 3.3*   Recent Labs    02/17/21 0000  LIPASE 22   No results for input(s): "AMMONIA" in the last 8760 hours. CBC: Recent Labs    06/30/21 0000 10/19/21 0000 10/28/21 0000  WBC 5.2 7.9 6.2  NEUTROABS 2,392.00 5,340.00 3,063.00  HGB 12.0 13.5 11.4*  HCT 37 40 35*  PLT 265 281 255   Cardiac Enzymes: No results for input(s): "CKTOTAL", "CKMB", "CKMBINDEX", "TROPONINI" in the last 8760 hours. BNP: Invalid input(s): "POCBNP" No results found for: "HGBA1C" Lab Results  Component Value Date  TSH 4.51 06/30/2021   No results found for: "VITAMINB12" No results found for: "FOLATE" No results found for: "IRON", "TIBC", "FERRITIN"  Imaging and Procedures obtained prior to SNF admission: CT 3D RECON AT SCANNER  Result Date: 06/11/2021 CLINICAL DATA:   Nonspecific (abnormal) findings on radiological and other examination of musculoskeletal system. History of right hip replacement in 2018 with subsequent removal. EXAM: 3-DIMENSIONAL CT IMAGE RENDERING ON ACQUISITION WORKSTATION TECHNIQUE: 3-dimensional CT images were rendered by post-processing of the original CT data on an acquisition workstation. The 3-dimensional CT images were interpreted and findings were reported in the accompanying complete CT report for this study COMPARISON:  None. FINDINGS: Changes from prior right hip arthroplasty with subsequent removal. Loss of bone stock and thinning of the right acetabulum with fragmentation along the medial aspect of the acetabulum. fragmentation of the right greater trochanter. Nondisplaced longitudinal fracture involving the anterior proximal femoral diaphysis. Severe thinning and fragmentation of the posterior margin of the proximal femoral diaphysis at the tip of the previous femoral stem component. Large amount of mixed soft tissue and fluid components at the site of the prior femoral head and neck components which may reflect granulation tissue and postsurgical seroma versus infection. 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 IMPRESSION: 1. Changes from prior right hip arthroplasty with subsequent removal. Loss of bone stock and thinning of the right acetabulum with fragmentation along the medial aspect of the acetabulum. Nondisplaced longitudinal fracture involving the anterior proximal femoral diaphysis. Severe thinning and fragmentation of the posterior margin of the proximal femoral diaphysis at the tip of the previous femoral stem component. Large amount of mixed soft tissue and fluid components at the site of the prior femoral head and neck components which may reflect granulation tissue and postsurgical seroma versus infection. Electronically Signed   By: Elige Ko M.D.   On: 06/11/2021 07:01    Assessment/Plan 1. 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  She seems to be excepting and adapted to this way of life  2. Stage 3a chronic kidney disease (HCC) Most recent creatinine 1.0.  Encourage hydration  3. Obesity (BMI 30-39.9) Patient unable to do any exercise.  Chances of her losing any significant weight are not great.  Did recommend curtailing some carbs where possible    Family/ staff Communication:   Labs/tests ordered:  .smmsig

## 2022-03-14 ENCOUNTER — Encounter: Payer: Self-pay | Admitting: Nurse Practitioner

## 2022-03-14 ENCOUNTER — Non-Acute Institutional Stay (SKILLED_NURSING_FACILITY): Payer: Medicare Other | Admitting: Nurse Practitioner

## 2022-03-14 DIAGNOSIS — F418 Other specified anxiety disorders: Secondary | ICD-10-CM

## 2022-03-14 DIAGNOSIS — M069 Rheumatoid arthritis, unspecified: Secondary | ICD-10-CM

## 2022-03-14 DIAGNOSIS — I10 Essential (primary) hypertension: Secondary | ICD-10-CM | POA: Diagnosis not present

## 2022-03-14 DIAGNOSIS — Z89621 Acquired absence of right hip joint: Secondary | ICD-10-CM

## 2022-03-14 DIAGNOSIS — K2100 Gastro-esophageal reflux disease with esophagitis, without bleeding: Secondary | ICD-10-CM

## 2022-03-14 DIAGNOSIS — E039 Hypothyroidism, unspecified: Secondary | ICD-10-CM

## 2022-03-14 DIAGNOSIS — D500 Iron deficiency anemia secondary to blood loss (chronic): Secondary | ICD-10-CM | POA: Diagnosis not present

## 2022-03-14 DIAGNOSIS — N1831 Chronic kidney disease, stage 3a: Secondary | ICD-10-CM

## 2022-03-14 DIAGNOSIS — F5105 Insomnia due to other mental disorder: Secondary | ICD-10-CM

## 2022-03-14 DIAGNOSIS — L89202 Pressure ulcer of unspecified hip, stage 2: Secondary | ICD-10-CM

## 2022-03-14 DIAGNOSIS — B372 Candidiasis of skin and nail: Secondary | ICD-10-CM

## 2022-03-14 NOTE — Assessment & Plan Note (Signed)
RA f/u Rheumatology, hx of Plaquenil use, stable presently.  

## 2022-03-14 NOTE — Assessment & Plan Note (Signed)
takes Levothyroxine, TSH 4.51 06/30/21 

## 2022-03-14 NOTE — Assessment & Plan Note (Signed)
Left thigh, healing, continue hydrocolloid dressing.

## 2022-03-14 NOTE — Assessment & Plan Note (Signed)
Bun/creat 30/1.3 10/28/21

## 2022-03-14 NOTE — Assessment & Plan Note (Signed)
stable, on Omeprazole, prn Zofran.  

## 2022-03-14 NOTE — Progress Notes (Signed)
Location:   SNF [facility] Room Number: [phone] Place of Service:  SNF (31) Provider: [facility] Anikin Prosser NP  Mahlon Gammon, MD  Patient Care Team: Mahlon Gammon, MD as PCP - General (Internal Medicine) [name], MD as PCP - Cardiology (Cardiology) [name], MD as PCP - Sleep Medicine (Cardiology) [name], MD (Sports Medicine) [name], MD (Ophthalmology) [name], [name], MD (Dermatology) [name], MD (Gastroenterology) [name], MD (Nephrology) [name], DC as Referring Physician (Chiropractic Medicine)  Extended Emergency Contact Information Primary Emergency Contact: [facility] Address: [address]          [place], [place] [address] [name] of [place] Home Phone: [phone] Mobile Phone: [phone] Relation: Spouse  Code Status:  DNR Goals of care: Advanced Directive information    01/18/2022    9:55 AM  Advanced Directives  Does Patient Have a Medical Advance Directive? Yes  Type of Advance Directive Out of facility DNR (pink MOST or yellow form)  Does patient want to make changes to medical advance directive? No - Patient declined  Pre-existing out of facility DNR order (yellow form or pink MOST form) Yellow form placed in chart (order not valid for inpatient use)     Chief Complaint  Patient presents with   Medical Management of Chronic Issues    HPI:  Pt is a 81 y.o. female seen today for medical management of chronic diseases.     Elevated AST/ALT normalized, S/p cholecystectomy, [place] 02/19/21 no cyst or mass.               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 F/u Ortho. TDWB. 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 Fully treated, off antibiotics.              Anemia, post op, s/p EGD no active bleed. S/p 6 u PRBC transfusion, ASA was dc'd, On Fe, Hgb 11.4 10/28/21             HTN only on  Furosemide.              Morbid obesity             OSA CPAP             Chronic diarrhea, prn Imodium, Align             RA f/u Rheumatology, hx of Plaquenil use, stable presently.              Insomnia/depression/anxiety, takes Mirtazapine             GERD, stable, on Omeprazole, prn Zofran.              Edema, not apparent, takes Furosemide  CKD stage 3 Bun/creat 30/1.3 10/28/21             Hypothyroidism, takes Levothyroxine, TSH 4.51 06/30/21     Past Medical History:  Diagnosis Date   Benign hypertension with chronic kidney disease, stage III (HCC)    Overview:  Last Assessment & Plan:  Usually the patient is hypertensive. However today her blood pressure is 105 systolic. She has been feeling fatigued with some lightheadedness. Part of this may be related to her lower blood pressure. Her pressure may be improved with her decrease in salt and fluid intake. I've instructed her to put  her lisinopril/hctz on hold. 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 It was possibly reentrant. She does well with diltiazem. No change in therapy.   SVT (supraventricular tachycardia)    Documented episode in the past, possibly reentrant  tachycardia   Thyroid disease    Urinary incontinence    Past Surgical History:  Procedure Laterality Date   CATARACT EXTRACTION Left 8/11   CATARACT EXTRACTION Right 3/12   CHOLECYSTECTOMY  1994   Copsilotomy Laser Treatment Left 6/12   eye   ELBOW SURGERY  8/12   EYE SURGERY Left 09/2008   macular hole   EYE SURGERY Right 12/11   Lumbar Infusion  2011   MOLE REMOVAL  06/17/2015   TOTAL KNEE ARTHROPLASTY Left 6/11   TOTAL KNEE ARTHROPLASTY Right 06/13/2011    Allergies  Allergen Reactions   Keflex [Cephalexin] Diarrhea   Macrobid [Nitrofurantoin] Hives    Allergies as of 03/14/2022       Reactions   Keflex [cephalexin] Diarrhea   Macrobid [nitrofurantoin] Hives        Medication List        Accurate as of March 14, 2022  4:23 PM. If you have any questions, ask your nurse or doctor.          Acetaminophen 325 MG Chew Chew 650 mg by mouth every 6 (six) hours as needed for pain.   bifidobacterium infantis capsule Take 1 capsule by mouth daily.   CALCIUM PLUS VITAMIN D3 PO Take 1 tablet by mouth daily. Vitamin D3 + Calcium 600   ferrous sulfate 325 (65 FE) MG EC tablet Take 325 mg by mouth. Once A Day on Mon, Thu   furosemide 40 MG tablet Commonly known as: LASIX Take 40 mg by mouth 2 (two) times daily.   gabapentin 300 MG capsule Commonly known as: NEURONTIN Take 300 mg by mouth at bedtime.   levothyroxine 25 MCG tablet Commonly known as: SYNTHROID Take 25 mcg by mouth every other day. Alternate with (2)tabs = 50 mcg; oral every other day   levothyroxine 25 MCG tablet Commonly known as: SYNTHROID Take 50 mcg by mouth every other day.   loperamide 2 MG capsule Commonly known as: IMODIUM Take 2 mg by mouth every 4 (four) hours as needed.   mirtazapine 15 MG tablet Commonly known as: REMERON Take 15 mg by mouth at bedtime.   nystatin powder Commonly known as: MYCOSTATIN/NYSTOP Apply 1 application  topically in the morning, at  noon, in the evening, and at bedtime. To genital areas and inner thigh   omeprazole 20 MG capsule Commonly known as: PRILOSEC Take 20 mg by mouth  daily.   ondansetron 4 MG disintegrating tablet Commonly known as: ZOFRAN-ODT Take 4 mg by mouth every 8 (eight) hours as needed.   potassium chloride SA 20 MEQ tablet Commonly known as: KLOR-CON M Take 40 mEq by mouth daily.   Vitamin D (Ergocalciferol) 50 MCG (2000 UT) Caps Take by mouth daily.        Review of Systems  Constitutional:  Negative for fatigue, fever and unexpected weight change.  HENT:  Negative for congestion and trouble swallowing.   Eyes:  Negative for visual disturbance.  Respiratory:  Negative for cough.   Cardiovascular:  Negative for leg swelling.  Gastrointestinal:  Negative for abdominal pain and constipation.  Genitourinary:  Negative for dysuria, frequency and urgency.  Musculoskeletal:  Positive for arthralgias and gait problem.  Skin:  Positive for rash. Negative for color change.  Neurological:  Negative for speech difficulty, weakness and headaches.  Psychiatric/Behavioral:  Negative for confusion and sleep disturbance. The patient is not nervous/anxious.     Immunization History  Administered Date(s) Administered   Influenza Split 01/28/2008, 02/17/2009, 02/20/2012, 02/11/2013, 02/02/2016, 01/11/2017, 02/04/2019   Influenza, High Dose Seasonal PF 02/04/2015, 01/11/2017, 01/30/2018, 02/05/2020   Moderna Covid-19 Vaccine Bivalent Booster 2yrs & up 09/10/2021   Moderna Sars-Covid-2 Vaccination 05/09/2019, 05/27/2019, 03/03/2020, 09/04/2020, 11/04/2020   Pfizer Covid-19 Vaccine Bivalent Booster 4yrs & up 02/09/2021   Pneumococcal Conjugate-13 08/30/2013   Pneumococcal Polysaccharide-23 11/03/2005   Td 08/01/2001, 09/05/2019   Tdap 09/22/2010, 11/24/2020   Zoster Recombinat (Shingrix) 11/08/2016, 01/11/2017   Zoster, Live 11/04/2005, 11/08/2016, 01/11/2017   Zoster, Unspecified 01/11/2017    Pertinent  Health Maintenance Due  Topic Date Due   INFLUENZA VACCINE  11/23/2021   DEXA SCAN  Completed      10/24/2020   11:31 AM  Fall Risk  Patient Fall Risk Level Moderate fall risk   Functional Status Survey:    Vitals:   03/14/22 1244  BP: 123/70  Pulse: 66  Resp: 18  Temp: 97.6 F (36.4 C)  SpO2: 96%  Weight: 281 lb 6.4 oz (127.6 kg)   Body mass index is 42.79 kg/m. Physical Exam Vitals and nursing note reviewed.  Constitutional:      Appearance: She is obese.  HENT:     Head: Normocephalic and atraumatic.     Mouth/Throat:     Mouth: Mucous membranes are moist.     Comments: Angular cheilitis, dentist prescribed Nystatin/Triamcinolone cream Eyes:     Extraocular Movements: Extraocular movements intact.     Conjunctiva/sclera: Conjunctivae normal.     Pupils: Pupils are equal, round, and reactive to light.  Cardiovascular:     Rate and Rhythm: Normal rate and regular rhythm.     Heart sounds: No murmur heard. Pulmonary:     Effort: Pulmonary effort is normal.  Abdominal:     General: Bowel sounds are normal.     Palpations: Abdomen is soft.     Tenderness: There is no abdominal tenderness. There is no right CVA tenderness, left CVA tenderness, guarding or rebound.  Genitourinary:    Comments: No urogenital irritation or skin breakdown Musculoskeletal:     Cervical back: Normal range of motion and neck supple.     Right lower leg: No edema.     Left lower leg: No edema.     Comments: R hip, prosthetic hip removed.   Skin:    General: Skin is warm and dry.     Findings: Rash present.     Comments: Right  hip surgical scar. Urogenital rash. Left thigh pressure ulcer stage 2, healing nicely.   Neurological:     General: No focal deficit present.     Mental Status: She is alert and oriented to person, place, and time.     Gait: Gait normal.  Psychiatric:        Mood and Affect: Mood normal.        Behavior: Behavior normal.        Thought Content:  Thought content normal.        Judgment: Judgment normal.     Labs reviewed: Recent Labs    06/30/21 0000 10/19/21 0000 10/28/21 0000  NA 140 139 139  K 4.2 4.5 4.3  CL 104 103 104  CO2 26* 29* 30*  BUN 24* 24* 30*  CREATININE 1.2* 1.0 1.3*  CALCIUM 8.9 9.2 8.9   Recent Labs    06/30/21 0000 10/19/21 0000 10/28/21 0000  AST 15 14 14   ALT 9 12 9   ALKPHOS 81 98 84  ALBUMIN 3.4* 3.8 3.3*   Recent Labs    06/30/21 0000 10/19/21 0000 10/28/21 0000  WBC 5.2 7.9 6.2  NEUTROABS 2,392.00 5,340.00 3,063.00  HGB 12.0 13.5 11.4*  HCT 37 40 35*  PLT 265 281 255   Lab Results  Component Value Date   TSH 4.51 06/30/2021   No results found for: "HGBA1C" No results found for: "CHOL", "HDL", "LDLCALC", "LDLDIRECT", "TRIG", "CHOLHDL"  Significant Diagnostic Results in last 30 days:  No results found.  Assessment/Plan  Absence of hip joint, right   R hip pain, s/p right THR 2018, removal of hardware 12/29/20,  takes Tylenol, Gabapentin. F/u Ortho. TDWB. 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 Fully treated, off antibiotics.   Blood loss anemia post op, s/p EGD no active bleed. S/p 6 u PRBC transfusion, ASA was dc'd, On Fe, Hgb 11.4 10/28/21  Benign essential HTN Blood pressure is controlled,  only on Furosemide.   Rheumatoid arthritis (HCC)   RA f/u Rheumatology, hx of Plaquenil use, stable presently.   Insomnia secondary to depression with anxiety  Insomnia/depression/anxiety, takes Mirtazapine  Gastroesophageal reflux disease stable, on Omeprazole, prn Zofran.   CKD (chronic kidney disease) stage 3, GFR 30-59 ml/min (HCC) Bun/creat 30/1.3 10/28/21  Hypothyroidism takes Levothyroxine, TSH 4.51 06/30/21  Candidiasis of skin Urogenital rash, c/o itching, apply 0.1% Triamcinolone + Nystatin cream bid to reddened areas x 10 days.    Pressure ulcer of thigh, stage 2 (HCC) Left thigh, healing, continue hydrocolloid dressing.     Family/ staff Communication: plan of care reviewed with the patient and charge nurse   Labs/tests ordered:  none   Time spend 35 minutes.

## 2022-03-14 NOTE — Assessment & Plan Note (Signed)
Urogenital rash, c/o itching, apply 0.1% Triamcinolone + Nystatin cream bid to reddened areas x 10 days.

## 2022-03-14 NOTE — Assessment & Plan Note (Signed)
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 F/u Ortho. TDWB. 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 Fully treated, off antibiotics.

## 2022-03-14 NOTE — Assessment & Plan Note (Signed)
post op, s/p EGD no active bleed. S/p 6 u PRBC transfusion, ASA was dc'd, On Fe, Hgb 11.4 10/28/21 

## 2022-03-14 NOTE — Assessment & Plan Note (Signed)
Insomnia/depression/anxiety, takes Mirtazapine 

## 2022-03-14 NOTE — Assessment & Plan Note (Signed)
Blood pressure is controlled,  only on Furosemide.  

## 2022-04-11 ENCOUNTER — Encounter: Payer: Self-pay | Admitting: Nurse Practitioner

## 2022-04-11 ENCOUNTER — Non-Acute Institutional Stay (SKILLED_NURSING_FACILITY): Payer: Medicare Other | Admitting: Nurse Practitioner

## 2022-04-11 DIAGNOSIS — N1831 Chronic kidney disease, stage 3a: Secondary | ICD-10-CM

## 2022-04-11 DIAGNOSIS — I10 Essential (primary) hypertension: Secondary | ICD-10-CM

## 2022-04-11 DIAGNOSIS — R600 Localized edema: Secondary | ICD-10-CM

## 2022-04-11 DIAGNOSIS — D500 Iron deficiency anemia secondary to blood loss (chronic): Secondary | ICD-10-CM

## 2022-04-11 DIAGNOSIS — G4733 Obstructive sleep apnea (adult) (pediatric): Secondary | ICD-10-CM | POA: Diagnosis not present

## 2022-04-11 DIAGNOSIS — M069 Rheumatoid arthritis, unspecified: Secondary | ICD-10-CM

## 2022-04-11 DIAGNOSIS — K2100 Gastro-esophageal reflux disease with esophagitis, without bleeding: Secondary | ICD-10-CM

## 2022-04-11 DIAGNOSIS — F5105 Insomnia due to other mental disorder: Secondary | ICD-10-CM

## 2022-04-11 DIAGNOSIS — Z89621 Acquired absence of right hip joint: Secondary | ICD-10-CM | POA: Diagnosis not present

## 2022-04-11 DIAGNOSIS — R300 Dysuria: Secondary | ICD-10-CM

## 2022-04-11 DIAGNOSIS — L600 Ingrowing nail: Secondary | ICD-10-CM

## 2022-04-11 DIAGNOSIS — E039 Hypothyroidism, unspecified: Secondary | ICD-10-CM

## 2022-04-11 DIAGNOSIS — N390 Urinary tract infection, site not specified: Secondary | ICD-10-CM

## 2022-04-11 DIAGNOSIS — F418 Other specified anxiety disorders: Secondary | ICD-10-CM

## 2022-04-11 NOTE — Assessment & Plan Note (Signed)
CPAP.  

## 2022-04-11 NOTE — Assessment & Plan Note (Signed)
RA f/u Rheumatology, hx of Plaquenil use, stable presently.  

## 2022-04-11 NOTE — Progress Notes (Signed)
Location:   [facility] Room Number: [phone] Place of Service:  SNF [phone]) Provider:  Mast, Man Link Snuffer, NP  Mahlon Gammon, MD  Patient Care Team: Mahlon Gammon, MD as PCP - General (Internal Medicine) [name], MD as PCP - Cardiology (Cardiology) [name], MD as PCP - Sleep Medicine (Cardiology) [name], MD (Sports Medicine) [name], MD (Ophthalmology) [name], [name], MD (Dermatology) [name], MD (Gastroenterology) [name], MD (Nephrology) [name], DC as Referring Physician (Chiropractic Medicine)  Extended Emergency Contact Information Primary Emergency Contact: [facility] Address: [address]          [place], [place] [address] [name] of [place] Home Phone: [phone] Mobile Phone: [phone] Relation: Spouse  Code Status:   Goals of care: Advanced Directive information    04/11/2022    8:53 AM  Advanced Directives  Does Patient Have a Medical Advance Directive? Yes  Type of Advance Directive Living will  Does patient want to make changes to medical advance directive? No - Patient declined     Chief Complaint  Patient presents with  . Medical Management of Chronic Issues    Routine follow up visit.  . Quality Metric Gaps    Medicare annual wellness due    HPI:  Pt is a 81 y.o. female seen today for medical management of chronic diseases.     Past Medical History:  Diagnosis Date  . Benign hypertension with chronic kidney disease, stage III (HCC)    Overview:  Last Assessment & Plan:  Usually the patient is hypertensive. However today her blood pressure is 105 systolic. She has been feeling fatigued with some lightheadedness. Part of this may be related to her lower blood pressure. Her pressure may be improved with her decrease in salt and fluid intake. I've instructed her to put her lisinopril/hctz on hold. I've asked her to see her primary   .  Cardiac disease 03/10/2014  . Chronic diarrhea 07/27/2015  . Edema 07/27/2015  . Ejection fraction 2004   Normal, echo,   . Gastroesophageal reflux disease   . GERD (gastroesophageal reflux disease)   . Heart disease 03/10/2014  . Hypertension   . Left bundle branch block 09/01/2020  . Obesity   . Obesity (BMI 30-39.9) 09/25/2017  . OSA (obstructive sleep apnea)    mild with AHI 6.75 - on CPAP  . Preop cardiovascular exam 11/2010   Cardiac clearance for knee surgery   . Sleep apnea 03/10/2014  . Supraventricular tachycardia    Documented episode in the past, possibly reentrant tachycardia  Overview:  Overview:  Documented episode in the past, 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 It was possibly reentrant. She does well with diltiazem. No change in therapy.  . 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  . Thyroid disease   . Urinary incontinence    Past Surgical History:  Procedure Laterality Date  . CATARACT EXTRACTION Left 8/11  . CATARACT EXTRACTION Right 3/12  . CHOLECYSTECTOMY  1994  . Copsilotomy Laser Treatment Left 6/12   eye  . ELBOW SURGERY  8/12  . 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 EYE SURGERY Right 12/11  . Lumbar Infusion  2011  . MOLE REMOVAL  06/17/2015  . 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  TOTAL KNEE ARTHROPLASTY Right 06/13/2011    Allergies  Allergen Reactions  . Keflex [Cephalexin] Diarrhea  . Macrobid [Nitrofurantoin] Hives    Allergies as of 04/11/2022       Reactions   Keflex [cephalexin] Diarrhea   Macrobid [nitrofurantoin] Hives        Medication List        Accurate as of April 11, 2022  9:09 AM. If you have any questions, ask your nurse or doctor.          Acetaminophen 325 MG Chew Chew 650 mg by mouth every 6 (six) hours as needed for pain.    bifidobacterium infantis capsule Take 1 capsule by mouth daily.   CALCIUM PLUS VITAMIN D3 PO Take 1 tablet by mouth daily. Vitamin D3 + Calcium 600   ferrous sulfate 325 (65 FE) MG EC tablet Take 325 mg by mouth. Once A Day on Mon, Thu   furosemide 40 MG tablet Commonly known as: LASIX Take 40 mg by mouth 2 (two) times daily.   gabapentin 300 MG capsule Commonly known as: NEURONTIN Take 300 mg by mouth at bedtime.   levothyroxine 25 MCG tablet Commonly known as: SYNTHROID Take 25 mcg by mouth every other day. Alternate with (2)tabs = 50 mcg; oral every other day   levothyroxine 25 MCG tablet Commonly known as: SYNTHROID Take 50 mcg by mouth every other day.   loperamide 2 MG capsule Commonly known as: IMODIUM Take 2 mg by mouth every 4 (four) hours as needed.   mirtazapine 7.5 MG tablet Commonly known as: REMERON Take 15 mg by mouth at bedtime.   nystatin powder Commonly known as: MYCOSTATIN/NYSTOP Apply 1 application  topically in the morning, at noon, in the evening, and at bedtime. To genital areas and inner thigh   omeprazole 20 MG capsule Commonly known as: PRILOSEC Take 20 mg by mouth daily.   ondansetron 4 MG disintegrating tablet Commonly known as: ZOFRAN-ODT Take 4 mg by mouth every 8 (eight) hours as needed.   potassium chloride SA 20 MEQ tablet Commonly known as: KLOR-CON M Take 20 mEq by mouth daily.   Vitamin D (Ergocalciferol) 50 MCG (2000 UT) Caps Take 2 capsules by mouth daily.        Review of Systems  Immunization History  Administered Date(s) Administered  . Influenza Split 01/28/2008, 02/17/2009, 02/20/2012, 02/11/2013, 02/02/2016, 01/11/2017, 02/04/2019  . Influenza, High Dose Seasonal PF 02/04/2015, 01/11/2017, 01/30/2018, 02/05/2020  . Influenza-Unspecified 02/16/2022  . Moderna Covid-19 Vaccine Bivalent Booster 15yrs & up 09/10/2021  . Moderna SARS-COV2 Booster Vaccination 02/24/2022  . Moderna Sars-Covid-2 Vaccination  05/09/2019, 05/27/2019, 03/03/2020, 09/04/2020, 11/04/2020  . [employer] 46yrs & up 02/09/2021  . Pneumococcal Conjugate-13 08/30/2013  . Pneumococcal Polysaccharide-23 11/03/2005  . Td 08/01/2001, 09/05/2019  . Tdap 09/22/2010, 11/24/2020  . Zoster Recombinat (Shingrix) 11/08/2016, 01/11/2017  . Zoster, Live 11/04/2005, 11/08/2016, 01/11/2017  . Zoster, Unspecified 01/11/2017   Pertinent  Health Maintenance Due  Topic Date Due  . INFLUENZA VACCINE  Completed  . DEXA SCAN  Completed      10/24/2020   11:31 AM  Fall Risk  Patient Fall Risk Level Moderate fall risk   Functional Status Survey:    Vitals:   04/11/22 0848  BP: 123/70  Pulse: 66  Resp: 18  Temp: 97.6 F (36.4 C)  SpO2: 96%  Weight: 281 lb 6.4 oz (127.6 kg)  Height: 5\' 8"  (1.727 m)   Body mass index is 42.79  kg/m. Physical Exam  Labs reviewed: Recent Labs    06/30/21 0000 10/19/21 0000 10/28/21 0000  NA 140 139 139  K 4.2 4.5 4.3  CL 104 103 104  CO2 26* 29* 30*  BUN 24* 24* 30*  CREATININE 1.2* 1.0 1.3*  CALCIUM 8.9 9.2 8.9   Recent Labs    06/30/21 0000 10/19/21 0000 10/28/21 0000  AST 15 14 14   ALT 9 12 9   ALKPHOS 81 98 84  ALBUMIN 3.4* 3.8 3.3*   Recent Labs    06/30/21 0000 10/19/21 0000 10/28/21 0000  WBC 5.2 7.9 6.2  NEUTROABS 2,392.00 5,340.00 3,063.00  HGB 12.0 13.5 11.4*  HCT 37 40 35*  PLT 265 281 255   Lab Results  Component Value Date   TSH 4.51 06/30/2021   No results found for: "HGBA1C" No results found for: "CHOL", "HDL", "LDLCALC", "LDLDIRECT", "TRIG", "CHOLHDL"  Significant Diagnostic Results in last 30 days:  No results found.  Assessment/Plan There are no diagnoses linked to this encounter.   Family/ staff Communication:   Labs/tests ordered:      This encounter was created in error - please disregard.

## 2022-04-11 NOTE — Assessment & Plan Note (Signed)
stable, on Omeprazole, prn Zofran.  

## 2022-04-11 NOTE — Assessment & Plan Note (Signed)
Blood pressure is controlled,  only on Furosemide.  

## 2022-04-11 NOTE — Assessment & Plan Note (Addendum)
Hx of R hip pain, s/p right THR 2018, removal of hardware 12/29/20,  takes Tylenol, Gabapentin. F/u Ortho. TDWB. 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 Fully treated, off antibiotics.

## 2022-04-11 NOTE — Assessment & Plan Note (Signed)
not apparent, takes Furosemide  

## 2022-04-11 NOTE — Assessment & Plan Note (Signed)
post op, s/p EGD no active bleed. S/p 6 u PRBC transfusion, ASA was dc'd, On Fe, Hgb 11.4 10/28/21 

## 2022-04-11 NOTE — Progress Notes (Addendum)
Location:   [facility] Room Number: 73 A Place of Service:  SNF [phone]) Provider:  Saquoia Sianez Link Snuffer, NP  Mahlon Gammon, MD  Patient Care Team: Mahlon Gammon, MD as PCP - General (Internal Medicine) [name], MD as PCP - Cardiology (Cardiology) [name], MD as PCP - Sleep Medicine (Cardiology) [name], MD (Sports Medicine) [name], MD (Ophthalmology) [name], [name], MD (Dermatology) [name], MD (Gastroenterology) [name], MD (Nephrology) [name], DC as Referring Physician (Chiropractic Medicine)  Extended Emergency Contact Information Primary Emergency Contact: [facility] Address: [address]          [place], [place] [address] [name] of [place] Home Phone: [phone] Mobile Phone: [phone] Relation: Spouse  Code Status:   Goals of care: Advanced Directive information    04/15/2022   10:18 AM  Advanced Directives  Does Patient Have a Medical Advance Directive? Yes  Type of Advance Directive Living will  Does patient want to make changes to medical advance directive? No - Patient declined     Chief Complaint  Patient presents with   Medical Management of Chronic Issues    Routine follow up visit   Quality Metric Gaps    Medicare Annual wellness visit due    HPI:  Pt is a 80 y.o. female seen today for medical management of chronic diseases.    Elevated AST/ALT normalized, S/p cholecystectomy, [place] 02/19/21 no cyst or mass.               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 F/u Ortho. TDWB. 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 Fully treated, off antibiotics.              Anemia, post op, s/p EGD no active bleed. S/p 6 u PRBC transfusion, ASA was dc'd, On Fe, Hgb 11.4 10/28/21             HTN only on Furosemide.              Morbid obesity             OSA CPAP              Chronic diarrhea, prn Imodium, Align             RA f/u Rheumatology, hx of Plaquenil use, stable presently.              Insomnia/depression/anxiety, takes Mirtazapine             GERD, stable, on Omeprazole, prn Zofran.              Edema, not apparent, takes Furosemide  CKD stage 3 Bun/creat 30/1.3 10/28/21             Hypothyroidism, takes Levothyroxine, TSH 4.51 06/30/21   Past Medical History:  Diagnosis Date   Benign hypertension with chronic kidney disease, stage III (HCC)    Overview:  Last Assessment & Plan:  Usually the patient is hypertensive. However today her blood pressure is 105 systolic. She has been feeling fatigued with some lightheadedness. Part of this may be related to her lower blood pressure. Her pressure may be improved with her decrease in salt and fluid intake. I've instructed her to put her lisinopril/hctz on hold. I've asked her to see her primary  Cardiac disease 03/10/2014   Chronic diarrhea 07/27/2015   Edema 07/27/2015   Ejection fraction 2004   Normal, echo,    Gastroesophageal reflux disease    GERD (gastroesophageal reflux disease)    Heart disease 03/10/2014   Hypertension    Left bundle branch block 09/01/2020   Obesity    Obesity (BMI 30-39.9) 09/25/2017   OSA (obstructive sleep apnea)    mild with AHI 6.75 - on CPAP   Preop cardiovascular exam 11/2010   Cardiac clearance for knee surgery    Sleep apnea 03/10/2014   Supraventricular tachycardia    Documented episode in the past, possibly reentrant tachycardia  Overview:  Overview:  Documented episode in the past, 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 It was possibly reentrant. She does well with diltiazem. No change in therapy.   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   Thyroid disease    Urinary incontinence    Past  Surgical History:  Procedure Laterality Date   CATARACT EXTRACTION Left 8/11   CATARACT EXTRACTION Right 3/12   CHOLECYSTECTOMY  1994   Copsilotomy Laser Treatment Left 6/12   eye   ELBOW SURGERY  8/12   EYE SURGERY Left 09/2008   macular hole   EYE SURGERY Right 12/11   Lumbar Infusion  2011   MOLE REMOVAL  06/17/2015   TOTAL KNEE ARTHROPLASTY Left 6/11   TOTAL KNEE ARTHROPLASTY Right 06/13/2011    Allergies  Allergen Reactions   Keflex [Cephalexin] Diarrhea   Macrobid [Nitrofurantoin] Hives    Allergies as of 04/11/2022       Reactions   Keflex [cephalexin] Diarrhea   Macrobid [nitrofurantoin] Hives        Medication List        Accurate as of April 11, 2022 11:59 PM. If you have any questions, ask your nurse or doctor.          Acetaminophen 325 MG Chew Chew 650 mg by mouth every 6 (six) hours as needed for pain.   bifidobacterium infantis capsule Take 1 capsule by mouth daily.   CALCIUM PLUS VITAMIN D3 PO Take 1 tablet by mouth daily. Vitamin D3 + Calcium 600   ferrous sulfate 325 (65 FE) MG EC tablet Take 325 mg by mouth. Once A Day on Mon, Thu   furosemide 40 MG tablet Commonly known as: LASIX Take 40 mg by mouth 2 (two) times daily.   gabapentin 300 MG capsule Commonly known as: NEURONTIN Take 300 mg by mouth at bedtime.   levothyroxine 25 MCG tablet Commonly known as: SYNTHROID Take 25 mcg by mouth every other day. Alternate with (2)tabs = 50 mcg; oral every other day   levothyroxine 25 MCG tablet Commonly known as: SYNTHROID Take 50 mcg by mouth every other day.   loperamide 2 MG capsule Commonly known as: IMODIUM Take 2 mg by mouth every 4 (four) hours as needed.   mirtazapine 7.5 MG tablet Commonly known as: REMERON Take 15 mg by mouth at bedtime.   nystatin powder Commonly known as: MYCOSTATIN/NYSTOP Apply 1 application  topically in the morning, at noon, in the evening, and at bedtime. To genital areas and inner  thigh   omeprazole 20 MG capsule Commonly known as: PRILOSEC Take 20 mg by mouth daily.   ondansetron 4 MG disintegrating tablet Commonly known as: ZOFRAN-ODT Take 4 mg  by mouth every 8 (eight) hours as needed.   potassium chloride SA 20 MEQ tablet Commonly known as: KLOR-CON M Take 20 mEq by mouth daily.   Vitamin D (Ergocalciferol) 50 MCG (2000 UT) Caps Take 2 capsules by mouth daily.        Review of Systems  Constitutional:  Negative for fatigue, fever and unexpected weight change.  HENT:  Negative for congestion and trouble swallowing.   Eyes:  Negative for visual disturbance.  Respiratory:  Negative for cough.   Cardiovascular:  Negative for leg swelling.  Gastrointestinal:  Negative for abdominal pain and constipation.  Genitourinary:  Positive for dysuria. Negative for frequency and urgency.  Musculoskeletal:  Positive for arthralgias and gait problem.  Skin:  Negative for color change.       Left great toe ingrown toe nail, tender and inflamed medical aspect.    Neurological:  Negative for speech difficulty, weakness and headaches.  Psychiatric/Behavioral:  Negative for confusion and sleep disturbance. The patient is not nervous/anxious.     Immunization History  Administered Date(s) Administered   Influenza Split 01/28/2008, 02/17/2009, 02/20/2012, 02/11/2013, 02/02/2016, 01/11/2017, 02/04/2019   Influenza, High Dose Seasonal PF 02/04/2015, 01/11/2017, 01/30/2018, 02/05/2020   Influenza-Unspecified 02/16/2022   Moderna Covid-19 Vaccine Bivalent Booster 55yrs & up 09/10/2021   Moderna SARS-COV2 Booster Vaccination 02/24/2022   Moderna Sars-Covid-2 Vaccination 05/09/2019, 05/27/2019, 03/03/2020, 09/04/2020, 11/04/2020   Pfizer Covid-19 Vaccine Bivalent Booster 39yrs & up 02/09/2021   Pneumococcal Conjugate-13 08/30/2013   Pneumococcal Polysaccharide-23 11/03/2005   Td 08/01/2001, 09/05/2019   Tdap 09/22/2010, 11/24/2020   Zoster Recombinat (Shingrix)  11/08/2016, 01/11/2017   Zoster, Live 11/04/2005, 11/08/2016, 01/11/2017   Zoster, Unspecified 01/11/2017   Pertinent  Health Maintenance Due  Topic Date Due   INFLUENZA VACCINE  Completed   DEXA SCAN  Completed      10/24/2020   11:31 AM 04/15/2022   10:15 AM  Fall Risk  Falls in the past year?  0  Was there an injury with Fall?  0  Fall Risk Category Calculator  0  Fall Risk Category  Low  Patient Fall Risk Level Moderate fall risk Moderate fall risk  Patient at Risk for Falls Due to  History of fall(s)  Fall risk Follow up  Falls evaluation completed   Functional Status Survey:    Vitals:   04/11/22 0912  BP: 123/70  Pulse: 66  Resp: 18  Temp: 97.6 F (36.4 C)  SpO2: 96%  Weight: 281 lb 6.4 oz (127.6 kg)  Height: 5\' 8"  (1.727 m)   Body mass index is 42.79 kg/m. Physical Exam Vitals and nursing note reviewed.  Constitutional:      Appearance: She is obese.  HENT:     Head: Normocephalic and atraumatic.     Mouth/Throat:     Mouth: Mucous membranes are moist.     Comments: Angular cheilitis, dentist prescribed Nystatin/Triamcinolone cream Eyes:     Extraocular Movements: Extraocular movements intact.     Conjunctiva/sclera: Conjunctivae normal.     Pupils: Pupils are equal, round, and reactive to light.  Cardiovascular:     Rate and Rhythm: Normal rate and regular rhythm.     Heart sounds: No murmur heard. Pulmonary:     Effort: Pulmonary effort is normal.  Abdominal:     General: Bowel sounds are normal.     Palpations: Abdomen is soft.     Tenderness: There is no abdominal tenderness. There is no right CVA tenderness, left CVA tenderness, guarding  or rebound.  Genitourinary:    Comments: No urogenital irritation or skin breakdown Musculoskeletal:     Cervical back: Normal range of motion and neck supple.     Right lower leg: No edema.     Left lower leg: No edema.     Comments: R hip, prosthetic hip removed.   Skin:    General: Skin is warm and  dry.     Comments: Right hip surgical scar. Left great toe ingrown toe nail, tender and inflamed medical aspect.    Neurological:     General: No focal deficit present.     Mental Status: She is alert and oriented to person, place, and time.     Gait: Gait normal.  Psychiatric:        Mood and Affect: Mood normal.        Behavior: Behavior normal.        Thought Content: Thought content normal.        Judgment: Judgment normal.     Labs reviewed: Recent Labs    06/30/21 0000 10/19/21 0000 10/28/21 0000  NA 140 139 139  K 4.2 4.5 4.3  CL 104 103 104  CO2 26* 29* 30*  BUN 24* 24* 30*  CREATININE 1.2* 1.0 1.3*  CALCIUM 8.9 9.2 8.9   Recent Labs    06/30/21 0000 10/19/21 0000 10/28/21 0000  AST 15 14 14   ALT 9 12 9   ALKPHOS 81 98 84  ALBUMIN 3.4* 3.8 3.3*   Recent Labs    06/30/21 0000 10/19/21 0000 10/28/21 0000  WBC 5.2 7.9 6.2  NEUTROABS 2,392.00 5,340.00 3,063.00  HGB 12.0 13.5 11.4*  HCT 37 40 35*  PLT 265 281 255   Lab Results  Component Value Date   TSH 4.51 06/30/2021   No results found for: "HGBA1C" No results found for: "CHOL", "HDL", "LDLCALC", "LDLDIRECT", "TRIG", "CHOLHDL"  Significant Diagnostic Results in last 30 days:  No results found.  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 F/u Ortho. TDWB. 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 Fully treated, off antibiotics.   Blood loss anemia  post op, s/p EGD no active bleed. S/p 6 u PRBC transfusion, ASA was dc'd, On Fe, Hgb 11.4 10/28/21  Benign essential HTN Blood pressure is controlled,  only on Furosemide.   OSA (obstructive sleep apnea) CPAP  Rheumatoid arthritis (HCC) RA f/u Rheumatology, hx of Plaquenil use, stable presently.   Insomnia secondary to depression with anxiety Stable, continue Mirtazapine.   Gastroesophageal reflux disease  stable,  on Omeprazole, prn Zofran.   Edema of lower extremity not apparent, takes Furosemide   CKD (chronic kidney disease) stage 3, GFR 30-59 ml/min (HCC) Bun/creat 30/1.3 10/28/21  Hypothyroidism takes Levothyroxine, TSH 4.51 06/30/21  Ingrown left greater toenail Left great toe ingrown toe nail, tender and inflamed medical aspect.  Podiatry eval and treat.   Dysuria Obtain I+O Cath UA C/S, observe.   UTI (urinary tract infection) 04/13/22 Urine culture: E. Coli, susceptible to Amox/Clavulanate  04/16/22 Augmentin 875/125mg  bid x 7days.      Family/ staff Communication: plan of care reviewed with the patient and charge nurse.   Labs/tests ordered: UA C/S  Time spend 35 minutes.

## 2022-04-11 NOTE — Assessment & Plan Note (Signed)
Bun/creat 30/1.3 10/28/21 

## 2022-04-11 NOTE — Assessment & Plan Note (Signed)
Stable, continue Mirtazapine.  

## 2022-04-11 NOTE — Assessment & Plan Note (Signed)
takes Levothyroxine, TSH 4.51 06/30/21 

## 2022-04-12 DIAGNOSIS — R300 Dysuria: Secondary | ICD-10-CM | POA: Insufficient documentation

## 2022-04-12 DIAGNOSIS — L600 Ingrowing nail: Secondary | ICD-10-CM | POA: Insufficient documentation

## 2022-04-12 NOTE — Assessment & Plan Note (Signed)
Left great toe ingrown toe nail, tender and inflamed medical aspect.  Podiatry eval and treat.

## 2022-04-12 NOTE — Assessment & Plan Note (Signed)
Obtain I+O Cath UA C/S, observe.

## 2022-04-15 ENCOUNTER — Encounter: Payer: Self-pay | Admitting: Orthopedic Surgery

## 2022-04-15 ENCOUNTER — Non-Acute Institutional Stay (SKILLED_NURSING_FACILITY): Payer: Medicare Other | Admitting: Orthopedic Surgery

## 2022-04-15 DIAGNOSIS — N3000 Acute cystitis without hematuria: Secondary | ICD-10-CM

## 2022-04-15 MED ORDER — CIPROFLOXACIN HCL 500 MG PO TABS: 500.0000 mg | ORAL_TABLET | Freq: Two times a day (BID) | ORAL | 0 refills | Status: DC

## 2022-04-15 NOTE — Progress Notes (Signed)
Location:  [facility] Room Number: 56A Place of Service:  SNF [phone]) Provider:  Hazle Nordmann, NP   Patient Care Team: Mahlon Gammon, MD as PCP - General (Internal Medicine) [name], MD as PCP - Cardiology (Cardiology) [name], MD as PCP - Sleep Medicine (Cardiology) [name], MD (Sports Medicine) [name], MD (Ophthalmology) [name], [name], MD (Dermatology) [name], MD (Gastroenterology) [name], MD (Nephrology) [name], DC as Referring Physician (Chiropractic Medicine)  Extended Emergency Contact Information Primary Emergency Contact: [facility] Address: [address]          [place], [place] [address] [name] of [place] Home Phone: [phone] Mobile Phone: [phone] Relation: Spouse  Code Status:  DNR Goals of care: Advanced Directive information    04/15/2022   10:18 AM  Advanced Directives  Does Patient Have a Medical Advance Directive? Yes  Type of Advance Directive Living will  Does patient want to make changes to medical advance directive? No - Patient declined     Chief Complaint  Patient presents with   Acute Visit    Patient is been seen for UTI with provider at [facility].    HPI:  Pt is a 81 y.o. female seen today for an acute visit for UTI.   12/20 she began to have itching and discomfort when urinating. UA/culture ordered. UA revealed positive nitrates, 1+ leukocytes, WBC 10-20, and bacteria. Urine culture indicated > 100,000 CFU/mL Escherichia coli. Sensitivity report not provided. She reports improved symptoms, but still some discomfort at times. Afebrile. Denies flank pain or hematuria.    Past Medical History:  Diagnosis Date   Benign hypertension with chronic kidney disease, stage III (HCC)    Overview:  Last Assessment & Plan:  Usually the patient is hypertensive. However today her blood pressure is 105 systolic. She has  been feeling fatigued with some lightheadedness. Part of this may be related to her lower blood pressure. Her pressure may be improved with her decrease in salt and fluid intake. I've instructed her to put her lisinopril/hctz on hold. 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 It was possibly reentrant. She does well with diltiazem. No change in therapy.   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   Thyroid disease    Urinary incontinence    Past Surgical History:  Procedure Laterality Date   CATARACT EXTRACTION Left 8/11   CATARACT EXTRACTION Right 3/12   CHOLECYSTECTOMY  1994   Copsilotomy Laser Treatment Left 6/12   eye   ELBOW SURGERY  8/12  EYE SURGERY Left 09/2008   macular hole   EYE SURGERY Right 12/11   Lumbar Infusion  2011   MOLE REMOVAL  06/17/2015   TOTAL KNEE ARTHROPLASTY Left 6/11   TOTAL KNEE ARTHROPLASTY Right 06/13/2011    Allergies  Allergen Reactions   Keflex [Cephalexin] Diarrhea   Macrobid [Nitrofurantoin] Hives    Outpatient Encounter Medications as of 04/15/2022  Medication Sig   Acetaminophen 325 MG CHEW Chew 650 mg by mouth  every 6 (six) hours as needed for pain.   bifidobacterium infantis (ALIGN) capsule Take 1 capsule by mouth daily.   Calcium Carb-Cholecalciferol (CALCIUM PLUS VITAMIN D3 PO) Take 1 tablet by mouth daily. Vitamin D3 + Calcium 600   ferrous sulfate 325 (65 FE) MG EC tablet Take 325 mg by mouth. Once A Day on Mon, Thu   furosemide (LASIX) 40 MG tablet Take 40 mg by mouth 2 (two) times daily.   gabapentin (NEURONTIN) 300 MG capsule Take 300 mg by mouth at bedtime.   levothyroxine (SYNTHROID) 25 MCG tablet Take 50 mcg by mouth every other day.   levothyroxine (SYNTHROID) 25 MCG tablet Take 25 mcg by mouth every other day. Alternate with (2)tabs = 50 mcg; oral every other day   loperamide (IMODIUM) 2 MG capsule Take 2 mg by mouth every 4 (four) hours as needed.   mirtazapine (REMERON) 7.5 MG tablet Take 15 mg by mouth at bedtime.   nystatin (MYCOSTATIN/NYSTOP) powder Apply 1 application  topically in the morning, at noon, in the evening, and at bedtime. To genital areas and inner thigh   omeprazole (PRILOSEC) 20 MG capsule Take 20 mg by mouth daily.   ondansetron (ZOFRAN-ODT) 4 MG disintegrating tablet Take 4 mg by mouth every 8 (eight) hours as needed.   potassium chloride SA (KLOR-CON) 20 MEQ tablet Take 20 mEq by mouth daily.   Vitamin D, Ergocalciferol, 50 MCG (2000 UT) CAPS Take 2 capsules by mouth daily.   No facility-administered encounter medications on file as of 04/15/2022.    Review of Systems  Constitutional:  Negative for activity change, appetite change, chills, fatigue and fever.  Respiratory:  Negative for cough, shortness of breath and wheezing.   Cardiovascular:  Negative for chest pain and leg swelling.  Genitourinary:  Positive for dysuria and hematuria. Negative for frequency.  Psychiatric/Behavioral:  Negative for dysphoric mood. The patient is not nervous/anxious.     Immunization History  Administered Date(s) Administered   Influenza Split 01/28/2008, 02/17/2009,  02/20/2012, 02/11/2013, 02/02/2016, 01/11/2017, 02/04/2019   Influenza, High Dose Seasonal PF 02/04/2015, 01/11/2017, 01/30/2018, 02/05/2020   Influenza-Unspecified 02/16/2022   Moderna Covid-19 Vaccine Bivalent Booster 28yrs & up 09/10/2021   Moderna SARS-COV2 Booster Vaccination 02/24/2022   Moderna Sars-Covid-2 Vaccination 05/09/2019, 05/27/2019, 03/03/2020, 09/04/2020, 11/04/2020   Pfizer Covid-19 Vaccine Bivalent Booster 35yrs & up 02/09/2021   Pneumococcal Conjugate-13 08/30/2013   Pneumococcal Polysaccharide-23 11/03/2005   Td 08/01/2001, 09/05/2019   Tdap 09/22/2010, 11/24/2020   Zoster Recombinat (Shingrix) 11/08/2016, 01/11/2017   Zoster, Live 11/04/2005, 11/08/2016, 01/11/2017   Zoster, Unspecified 01/11/2017   Pertinent  Health Maintenance Due  Topic Date Due   INFLUENZA VACCINE  Completed   DEXA SCAN  Completed      10/24/2020   11:31 AM 04/15/2022   10:15 AM  Fall Risk  Falls in the past year?  0  Was there an injury with Fall?  0  Fall Risk Category Calculator  0  Fall Risk Category  Low  Patient Fall Risk  Level Moderate fall risk Moderate fall risk  Patient at Risk for Falls Due to  History of fall(s)  Fall risk Follow up  Falls evaluation completed   Functional Status Survey:    Vitals:   04/15/22 1009  BP: 123/70  Pulse: 66  Resp: 18  Temp: 97.6 F (36.4 C)  SpO2: 96%  Weight: 285 lb (129.3 kg)  Height: 5\' 8"  (1.727 m)   Body mass index is 43.33 kg/m. Physical Exam  Labs reviewed: Recent Labs    06/30/21 0000 10/19/21 0000 10/28/21 0000  NA 140 139 139  K 4.2 4.5 4.3  CL 104 103 104  CO2 26* 29* 30*  BUN 24* 24* 30*  CREATININE 1.2* 1.0 1.3*  CALCIUM 8.9 9.2 8.9   Recent Labs    06/30/21 0000 10/19/21 0000 10/28/21 0000  AST 15 14 14   ALT 9 12 9   ALKPHOS 81 98 84  ALBUMIN 3.4* 3.8 3.3*   Recent Labs    06/30/21 0000 10/19/21 0000 10/28/21 0000  WBC 5.2 7.9 6.2  NEUTROABS 2,392.00 5,340.00 3,063.00  HGB 12.0 13.5 11.4*   HCT 37 40 35*  PLT 265 281 255   Lab Results  Component Value Date   TSH 4.51 06/30/2021   No results found for: "HGBA1C" No results found for: "CHOL", "HDL", "LDLCALC", "LDLDIRECT", "TRIG", "CHOLHDL"  Significant Diagnostic Results in last 30 days:  No results found.  Assessment/Plan 1. Acute cystitis without hematuria - UA revealed positive nitrates, 1+ leukocytes, WBC 10-20, and bacteria - Urine culture indicated > 100,000 CFU/mL Escherichia coli> sensitivity pending - allergy to keflex and nitrofurantoin - improved symptoms - will start cipro 500 mg po daily x 5 days> nursing to report sensitivity report when available     Family/ staff Communication: plan discussed with patient and nurse  Labs/tests ordered:  none

## 2022-04-19 ENCOUNTER — Telehealth: Payer: Self-pay | Admitting: Adult Health

## 2022-04-19 NOTE — Assessment & Plan Note (Signed)
04/13/22 Urine culture: E. Coli, susceptible to Amox/Clavulanate  04/16/22 Augmentin 875/125mg  bid x 7days.

## 2022-04-19 NOTE — Telephone Encounter (Signed)
Nurse called to report Ms. Zawadzki has a red patchy itchy rash to her arms and trunk. She is not wheezing or having trouble breathing. The rash started after taking Augmentin for a UTI.  She has received 3 days of treatment per nurse and prior to this she took a day of cipro before therapy was changed. I recommend she stop the augmentin and take a dose of zyrtec 10 mg for itching. Please have provider follow up to further eval the continued need for antibiotics.

## 2022-04-20 ENCOUNTER — Encounter: Payer: Self-pay | Admitting: Adult Health

## 2022-04-20 ENCOUNTER — Non-Acute Institutional Stay (SKILLED_NURSING_FACILITY): Payer: Medicare Other | Admitting: Adult Health

## 2022-04-20 DIAGNOSIS — R062 Wheezing: Secondary | ICD-10-CM

## 2022-04-20 DIAGNOSIS — R21 Rash and other nonspecific skin eruption: Secondary | ICD-10-CM

## 2022-04-20 NOTE — Progress Notes (Signed)
Location:  [facility] Room Number: NO/56/A Place of Service:  SNF (31) Provider:  Kenard Gower, DNP, FNP-BC  Patient Care Team: Mahlon Gammon, MD as PCP - General (Internal Medicine) [name], MD as PCP - Cardiology (Cardiology) [name], MD as PCP - Sleep Medicine (Cardiology) [name], MD (Sports Medicine) [name], MD (Ophthalmology) [name], [name], MD (Dermatology) [name], MD (Gastroenterology) [name], MD (Nephrology) [name], DC as Referring Physician (Chiropractic Medicine)  Extended Emergency Contact Information Primary Emergency Contact: [facility] Address: [address]          [place], [place] [address] [name] of [place] Home Phone: [phone] Mobile Phone: [phone] Relation: Spouse  Code Status:  DNR  Goals of care: Advanced Directive information    04/27/2022   11:36 AM  Advanced Directives  Does Patient Have a Medical Advance Directive? Yes  Type of Advance Directive Living will  Does patient want to make changes to medical advance directive? No - Patient declined     Chief Complaint  Patient presents with   Acute Visit    Patient is being seen for rash on arms and trunk    HPI:  Pt is a 81 y.o. female seen today for an acute visit regarding rashes on arms and trunk. She was seen in her room today. According to patient, she had taken Cipro for UTI and started having rashes. She is currently taking Loratadine 10 mg for itching. No noted rashes on her arms and trunk today. She said that she was wheezing last night.. No wheezing this morning. Today, O2 sat 97% on room air. No noted shortness of breath.   Past Medical History:  Diagnosis Date   Benign hypertension with chronic kidney disease, stage III (HCC)    Overview:  Last Assessment & Plan:  Usually the patient is hypertensive. However today her blood pressure is 105  systolic. She has been feeling fatigued with some lightheadedness. Part of this may be related to her lower blood pressure. Her pressure may be improved with her decrease in salt and fluid intake. I've instructed her to put her lisinopril/hctz on hold. 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 It was possibly reentrant. She does well with diltiazem. No change in therapy.   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   Thyroid disease    Urinary incontinence    Past Surgical History:  Procedure Laterality Date   CATARACT EXTRACTION Left 8/11   CATARACT EXTRACTION Right 3/12   CHOLECYSTECTOMY  1994   Copsilotomy Laser  Treatment Left 6/12   eye   ELBOW SURGERY  8/12   EYE SURGERY Left 09/2008   macular hole   EYE SURGERY Right 12/11   Lumbar Infusion  2011   MOLE REMOVAL  06/17/2015   TOTAL KNEE ARTHROPLASTY Left 6/11   TOTAL KNEE ARTHROPLASTY Right 06/13/2011    Allergies  Allergen Reactions   Keflex [Cephalexin] Diarrhea   Macrobid [Nitrofurantoin] Hives   Amoxicillin Rash    Outpatient Encounter Medications as of 04/20/2022  Medication Sig    Acetaminophen 325 MG CHEW Chew 650 mg by mouth every 6 (six) hours as needed for pain.   bifidobacterium infantis (ALIGN) capsule Take 1 capsule by mouth daily.   Calcium Carb-Cholecalciferol (CALCIUM PLUS VITAMIN D3 PO) Take 1 tablet by mouth daily. Vitamin D3 + Calcium 600   ferrous sulfate 325 (65 FE) MG EC tablet Take 325 mg by mouth. Once A Day on Mon, Thu   furosemide (LASIX) 40 MG tablet Take 40 mg by mouth 2 (two) times daily.   gabapentin (NEURONTIN) 300 MG capsule Take 300 mg by mouth at bedtime.   levothyroxine (SYNTHROID) 25 MCG tablet Take 50 mcg by mouth every other day.   levothyroxine (SYNTHROID) 25 MCG tablet Take 25 mcg by mouth every other day. Alternate with (2)tabs = 50 mcg; oral every other day   loperamide (IMODIUM) 2 MG capsule Take 2 mg by mouth every 4 (four) hours as needed.   mirtazapine (REMERON) 7.5 MG tablet Take 15 mg by mouth at bedtime.   nystatin (MYCOSTATIN/NYSTOP) powder Apply 1 application  topically in the morning, at noon, in the evening, and at bedtime. To genital areas and inner thigh   omeprazole (PRILOSEC) 20 MG capsule Take 20 mg by mouth daily.   ondansetron (ZOFRAN-ODT) 4 MG disintegrating tablet Take 4 mg by mouth every 8 (eight) hours as needed.   potassium chloride SA (KLOR-CON) 20 MEQ tablet Take 20 mEq by mouth daily.   Vitamin D, Ergocalciferol, 50 MCG (2000 UT) CAPS Take 2 capsules by mouth daily.   No facility-administered encounter medications on file as of 04/20/2022.    Review of Systems  Constitutional:  Negative for appetite change, chills, fatigue and fever.  HENT:  Negative for congestion, hearing loss, rhinorrhea and sore throat.   Eyes: Negative.   Respiratory:  Positive for wheezing. Negative for cough and shortness of breath.   Cardiovascular:  Negative for chest pain, palpitations and leg swelling.  Gastrointestinal:  Negative for abdominal pain, constipation, diarrhea, nausea and vomiting.  Genitourinary:  Negative  for dysuria.  Musculoskeletal:  Negative for arthralgias, back pain and myalgias.  Skin:  Negative for color change, rash and wound.  Neurological:  Negative for dizziness, weakness and headaches.  Psychiatric/Behavioral:  Negative for behavioral problems. The patient is not nervous/anxious.       Immunization History  Administered Date(s) Administered   Influenza Split 01/28/2008, 02/17/2009, 02/20/2012, 02/11/2013, 02/02/2016, 01/11/2017, 02/04/2019   Influenza, High Dose Seasonal PF 02/04/2015, 01/11/2017, 01/30/2018, 02/05/2020   Influenza-Unspecified 02/16/2022   Moderna Covid-19 Vaccine Bivalent Booster 59yrs & up 09/10/2021   Moderna SARS-COV2 Booster Vaccination 02/24/2022   Moderna Sars-Covid-2 Vaccination 05/09/2019, 05/27/2019, 03/03/2020, 09/04/2020, 11/04/2020   Pfizer Covid-19 Vaccine Bivalent Booster 59yrs & up 02/09/2021   Pneumococcal Conjugate-13 08/30/2013   Pneumococcal Polysaccharide-23 11/03/2005   Td 08/01/2001, 09/05/2019   Tdap 09/22/2010, 11/24/2020   Zoster Recombinat (Shingrix) 11/08/2016, 01/11/2017   Zoster, Live 11/04/2005, 11/08/2016, 01/11/2017   Zoster, Unspecified 01/11/2017  Pertinent  Health Maintenance Due  Topic Date Due   INFLUENZA VACCINE  Completed   DEXA SCAN  Completed      10/24/2020   11:31 AM 04/15/2022   10:15 AM 04/27/2022   11:35 AM  Fall Risk  Falls in the past year?  0 0  Was there an injury with Fall?  0 0  Fall Risk Category Calculator  0 0  Fall Risk Category  Low Low  Patient Fall Risk Level Moderate fall risk Moderate fall risk Moderate fall risk  Patient at Risk for Falls Due to  History of fall(s) History of fall(s)  Fall risk Follow up  Falls evaluation completed Falls evaluation completed     Vitals:   04/20/22 1000  BP: (!) 122/50  Pulse: 68  Resp: 16  Temp: (!) 97.4 F (36.3 C)  SpO2: 97%  Weight: 285 lb (129.3 kg)  Height: 5' 7.5" (1.715 m)   Body mass index is 43.98 kg/m.  Physical  Exam Constitutional:      Appearance: She is obese.     Comments: Morbidly obese  HENT:     Head: Normocephalic and atraumatic.     Nose: Nose normal.     Mouth/Throat:     Mouth: Mucous membranes are moist.  Eyes:     Conjunctiva/sclera: Conjunctivae normal.  Cardiovascular:     Rate and Rhythm: Normal rate and regular rhythm.  Pulmonary:     Effort: Pulmonary effort is normal.     Breath sounds: Normal breath sounds.  Abdominal:     General: Bowel sounds are normal.     Palpations: Abdomen is soft.  Musculoskeletal:     Cervical back: Normal range of motion.  Skin:    General: Skin is warm and dry.  Neurological:     Mental Status: She is alert and oriented to person, place, and time. Mental status is at baseline.  Psychiatric:        Mood and Affect: Mood normal.        Behavior: Behavior normal.        Labs reviewed: Recent Labs    06/30/21 0000 10/19/21 0000 10/28/21 0000  NA 140 139 139  K 4.2 4.5 4.3  CL 104 103 104  CO2 26* 29* 30*  BUN 24* 24* 30*  CREATININE 1.2* 1.0 1.3*  CALCIUM 8.9 9.2 8.9   Recent Labs    06/30/21 0000 10/19/21 0000 10/28/21 0000  AST 15 14 14   ALT 9 12 9   ALKPHOS 81 98 84  ALBUMIN 3.4* 3.8 3.3*   Recent Labs    06/30/21 0000 10/19/21 0000 10/28/21 0000  WBC 5.2 7.9 6.2  NEUTROABS 2,392.00 5,340.00 3,063.00  HGB 12.0 13.5 11.4*  HCT 37 40 35*  PLT 265 281 255   Lab Results  Component Value Date   TSH 4.51 06/30/2021   No results found for: "HGBA1C" No results found for: "CHOL", "HDL", "LDLCALC", "LDLDIRECT", "TRIG", "CHOLHDL"  Significant Diagnostic Results in last 30 days:  No results found.  Assessment/Plan  1. Rash and other nonspecific skin eruption -  no noted rashes -  will start on Aquaphor ointment apply topically to left arm and chest BID -  continue Loratadine 10 mg daily  2. Wheezing -  start on Proventil HFA 90 mcg/ACT inhale two puffs Q 6 hours PRN   Family/ staff Communication:  Discussed plan of care with resident and charge nurse  Labs/tests ordered: None    Abdulahad Mederos Medina-Vargas, DNP,  MSN, FNP-BC [place] [phone] (Monday-Friday 8:00 a.m. - 5:00 p.m.) [phone] (after hours)

## 2022-04-21 ENCOUNTER — Non-Acute Institutional Stay (SKILLED_NURSING_FACILITY): Payer: Medicare Other | Admitting: Nurse Practitioner

## 2022-04-21 ENCOUNTER — Encounter: Payer: Self-pay | Admitting: Nurse Practitioner

## 2022-04-21 DIAGNOSIS — E039 Hypothyroidism, unspecified: Secondary | ICD-10-CM

## 2022-04-21 DIAGNOSIS — R600 Localized edema: Secondary | ICD-10-CM

## 2022-04-21 DIAGNOSIS — N1831 Chronic kidney disease, stage 3a: Secondary | ICD-10-CM

## 2022-04-21 DIAGNOSIS — D500 Iron deficiency anemia secondary to blood loss (chronic): Secondary | ICD-10-CM

## 2022-04-21 DIAGNOSIS — M069 Rheumatoid arthritis, unspecified: Secondary | ICD-10-CM

## 2022-04-21 DIAGNOSIS — N390 Urinary tract infection, site not specified: Secondary | ICD-10-CM | POA: Diagnosis not present

## 2022-04-21 DIAGNOSIS — L270 Generalized skin eruption due to drugs and medicaments taken internally: Secondary | ICD-10-CM | POA: Diagnosis not present

## 2022-04-21 DIAGNOSIS — F5105 Insomnia due to other mental disorder: Secondary | ICD-10-CM

## 2022-04-21 DIAGNOSIS — Z89621 Acquired absence of right hip joint: Secondary | ICD-10-CM

## 2022-04-21 DIAGNOSIS — K529 Noninfective gastroenteritis and colitis, unspecified: Secondary | ICD-10-CM

## 2022-04-21 DIAGNOSIS — F418 Other specified anxiety disorders: Secondary | ICD-10-CM

## 2022-04-21 DIAGNOSIS — I10 Essential (primary) hypertension: Secondary | ICD-10-CM

## 2022-04-21 DIAGNOSIS — K2100 Gastro-esophageal reflux disease with esophagitis, without bleeding: Secondary | ICD-10-CM

## 2022-04-21 NOTE — Progress Notes (Signed)
Location:  [facility] Room Number: NO/56/A Place of Service:  SNF (31) Provider:  Astria Jordahl X, NP   Patient Care Team: Mahlon Gammon, MD as PCP - General (Internal Medicine) [name], MD as PCP - Cardiology (Cardiology) [name], MD as PCP - Sleep Medicine (Cardiology) [name], MD (Sports Medicine) [name], MD (Ophthalmology) [name], [name], MD (Dermatology) [name], MD (Gastroenterology) [name], MD (Nephrology) [name], DC as Referring Physician (Chiropractic Medicine)  Extended Emergency Contact Information Primary Emergency Contact: [facility] Address: [address]          [place], [place] [address] [name] of [place] Home Phone: [phone] Mobile Phone: [phone] Relation: Spouse  Code Status:  DNR Goals of care: Advanced Directive information    04/15/2022   10:18 AM  Advanced Directives  Does Patient Have a Medical Advance Directive? Yes  Type of Advance Directive Living will  Does patient want to make changes to medical advance directive? No - Patient declined     Chief Complaint  Patient presents with   Acute Visit    Patient is being seen for rash    HPI:  Pt is a 81 y.o. female seen today for an acute visit for rash diffused all over her body following Augmentin for UTI, the patient stated she can tolerated it w/o tx. UTI, treated with Augmentin per urine culture, she denied s/s of UTI, desires no further tx.  Elevated AST/ALT normalized, S/p cholecystectomy, [place] 02/19/21 no cyst or mass.               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 F/u Ortho. TDWB. 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 Fully treated, off antibiotics.              Anemia, post op, s/p EGD no active bleed. S/p 6 u PRBC transfusion, ASA was dc'd, On Fe, Hgb 11.4 10/28/21              HTN only on Furosemide.              Morbid obesity             OSA CPAP             Chronic diarrhea, prn Imodium, Align             RA f/u Rheumatology, hx of Plaquenil use, stable presently.              Insomnia/depression/anxiety, takes Mirtazapine             GERD, stable, on Omeprazole, prn Zofran.              Edema, not apparent, takes Furosemide  CKD stage 3 Bun/creat 30/1.3 10/28/21             Hypothyroidism, takes Levothyroxine, TSH 4.51 06/30/21     Past Medical History:  Diagnosis Date   Benign hypertension with chronic kidney disease, stage III (HCC)    Overview:  Last Assessment & Plan:  Usually the patient is hypertensive. However today her blood pressure is 105 systolic. She has been feeling fatigued with some lightheadedness. Part of this may be related to her lower blood pressure. Her pressure may be improved with her decrease in salt and fluid intake. I've instructed her  to put her lisinopril/hctz on hold. 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 It was possibly reentrant. She does well with diltiazem. No change in therapy.   SVT (supraventricular tachycardia)    Documented episode in the past,  possibly reentrant tachycardia   Thyroid disease    Urinary incontinence    Past Surgical History:  Procedure Laterality Date   CATARACT EXTRACTION Left 8/11   CATARACT EXTRACTION Right 3/12   CHOLECYSTECTOMY  1994   Copsilotomy Laser Treatment Left 6/12   eye   ELBOW SURGERY  8/12   EYE SURGERY Left 09/2008   macular hole   EYE SURGERY Right 12/11   Lumbar Infusion  2011   MOLE REMOVAL  06/17/2015   TOTAL KNEE ARTHROPLASTY Left 6/11   TOTAL KNEE ARTHROPLASTY Right 06/13/2011    Allergies  Allergen Reactions   Keflex [Cephalexin] Diarrhea   Macrobid [Nitrofurantoin] Hives   Amoxicillin Rash    Outpatient Encounter Medications as of 04/21/2022  Medication Sig   Acetaminophen 325 MG CHEW Chew 650 mg by mouth every 6 (six) hours as needed for pain.   bifidobacterium infantis (ALIGN) capsule Take 1 capsule by mouth daily.   Calcium Carb-Cholecalciferol (CALCIUM PLUS VITAMIN D3 PO) Take 1 tablet by mouth daily. Vitamin D3 + Calcium 600   ferrous sulfate 325 (65 FE) MG EC tablet Take 325 mg by mouth. Once A Day on Mon, Thu   furosemide (LASIX) 40 MG tablet Take 40 mg by mouth 2 (two) times daily.   gabapentin (NEURONTIN) 300 MG capsule Take 300 mg by mouth at bedtime.   levothyroxine (SYNTHROID) 25 MCG tablet Take 50 mcg by mouth every other day.   levothyroxine (SYNTHROID) 25 MCG tablet Take 25 mcg by mouth every other day. Alternate with (2)tabs = 50 mcg; oral every other day   loperamide (IMODIUM) 2 MG capsule Take 2 mg by mouth every 4 (four) hours as needed.   mirtazapine (REMERON) 7.5 MG tablet Take 15 mg by mouth at bedtime.   nystatin (MYCOSTATIN/NYSTOP) powder Apply 1 application  topically in the morning, at noon, in the evening, and at bedtime. To genital areas and inner thigh   omeprazole (PRILOSEC) 20 MG capsule Take 20 mg by mouth daily.   ondansetron (ZOFRAN-ODT) 4 MG disintegrating tablet Take 4 mg by mouth every 8 (eight) hours as needed.   potassium  chloride SA (KLOR-CON) 20 MEQ tablet Take 20 mEq by mouth daily.   Vitamin D, Ergocalciferol, 50 MCG (2000 UT) CAPS Take 2 capsules by mouth daily.   No facility-administered encounter medications on file as of 04/21/2022.    Review of Systems  Constitutional:  Negative for fatigue, fever and unexpected weight change.  HENT:  Negative for congestion and trouble swallowing.   Eyes:  Negative for visual disturbance.  Respiratory:  Negative for cough.   Cardiovascular:  Negative for leg swelling.  Gastrointestinal:  Negative for abdominal pain and constipation.  Genitourinary:  Negative for dysuria, frequency and urgency.  Musculoskeletal:  Positive for arthralgias and gait problem.  Skin:  Positive for rash.       Diffused drug rash arms, legs, chest, back.   Neurological:  Negative for speech difficulty, weakness and headaches.  Psychiatric/Behavioral:  Negative for confusion and sleep disturbance. The patient is not nervous/anxious.     Immunization History  Administered Date(s) Administered   Influenza Split 01/28/2008, 02/17/2009, 02/20/2012, 02/11/2013, 02/02/2016, 01/11/2017, 02/04/2019   Influenza, High Dose Seasonal PF 02/04/2015, 01/11/2017, 01/30/2018, 02/05/2020   Influenza-Unspecified 02/16/2022   Moderna Covid-19 Vaccine Bivalent Booster 42yrs & up 09/10/2021   Moderna SARS-COV2 Booster Vaccination 02/24/2022   Moderna Sars-Covid-2 Vaccination 05/09/2019, 05/27/2019, 03/03/2020, 09/04/2020, 11/04/2020   Pfizer Covid-19 Vaccine Bivalent Booster 6yrs & up 02/09/2021   Pneumococcal Conjugate-13 08/30/2013   Pneumococcal Polysaccharide-23 11/03/2005   Td 08/01/2001, 09/05/2019   Tdap 09/22/2010, 11/24/2020   Zoster Recombinat (Shingrix) 11/08/2016, 01/11/2017   Zoster, Live 11/04/2005, 11/08/2016, 01/11/2017   Zoster, Unspecified 01/11/2017   Pertinent  Health Maintenance Due  Topic Date Due   INFLUENZA VACCINE  Completed   DEXA SCAN  Completed      10/24/2020    11:31 AM 04/15/2022   10:15 AM  Fall Risk  Falls in the past year?  0  Was there an injury with Fall?  0  Fall Risk Category Calculator  0  Fall Risk Category  Low  Patient Fall Risk Level Moderate fall risk Moderate fall risk  Patient at Risk for Falls Due to  History of fall(s)  Fall risk Follow up  Falls evaluation completed   Functional Status Survey:    Vitals:   04/21/22 1046  Weight: 285 lb (129.3 kg)  Height:  (1.727 m)   Body mass index is 43.33 kg/m. Physical Exam Vitals and nursing note reviewed.  Constitutional:      Appearance: She is obese.  HENT:     Head: Normocephalic and atraumatic.     Mouth/Throat:     Mouth: Mucous membranes are moist.  Eyes:     Extraocular Movements: Extraocular movements intact.     Conjunctiva/sclera: Conjunctivae normal.     Pupils: Pupils are equal, round, and reactive to light.  Cardiovascular:     Rate and Rhythm: Normal rate and regular rhythm.     Heart sounds: No murmur heard. Pulmonary:     Effort: Pulmonary effort is normal.  Abdominal:     General: Bowel sounds are normal.     Palpations: Abdomen is soft.     Tenderness: There is no abdominal tenderness.  Genitourinary:    Comments: No urogenital irritation or skin breakdown Musculoskeletal:     Cervical back: Normal range of motion and neck supple.     Right lower leg: No edema.     Left lower leg: No edema.     Comments: R hip, prosthetic hip removed.   Skin:    General: Skin is warm and dry.     Findings: Rash present.     Comments: Right hip surgical scar. Diffused drug rash mild raised welts on arms, legs, chest, back.   Neurological:     General: No  focal deficit present.     Mental Status: She is alert and oriented to person, place, and time.     Gait: Gait normal.  Psychiatric:        Mood and Affect: Mood normal.        Behavior: Behavior normal.        Thought Content: Thought content normal.        Judgment: Judgment normal.     Labs  reviewed: Recent Labs    06/30/21 0000 10/19/21 0000 10/28/21 0000  NA 140 139 139  K 4.2 4.5 4.3  CL 104 103 104  CO2 26* 29* 30*  BUN 24* 24* 30*  CREATININE 1.2* 1.0 1.3*  CALCIUM 8.9 9.2 8.9   Recent Labs    06/30/21 0000 10/19/21 0000 10/28/21 0000  AST 15 14 14   ALT 9 12 9   ALKPHOS 81 98 84  ALBUMIN 3.4* 3.8 3.3*   Recent Labs    06/30/21 0000 10/19/21 0000 10/28/21 0000  WBC 5.2 7.9 6.2  NEUTROABS 2,392.00 5,340.00 3,063.00  HGB 12.0 13.5 11.4*  HCT 37 40 35*  PLT 265 281 255   Lab Results  Component Value Date   TSH 4.51 06/30/2021   No results found for: "HGBA1C" No results found for: "CHOL", "HDL", "LDLCALC", "LDLDIRECT", "TRIG", "CHOLHDL"  Significant Diagnostic Results in last 30 days:  No results found.  Assessment/Plan UTI (urinary tract infection) 04/13/22 Urine culture: E. Coli, susceptible to Amox/Clavulanate 04/16/22 Augmentin 875/125mg  bid x 7days-discontinued 2/2 rash eruption. The patient desires no further tx, she is asymptomatic presently.   Drug rash rash diffused all over her body following Augmentin for UTI, the patient stated she can tolerated it w/o tx.  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 F/u Ortho. TDWB. Power wc  Blood loss anemia post op, s/p EGD no active bleed. S/p 6 u PRBC transfusion, ASA was dc'd, On Fe, Hgb 11.4 10/28/21  Benign essential HTN Blood pressure is controlled,  only on Furosemide.   Chronic diarrhea prn Imodium, Align  Rheumatoid arthritis (HCC)  hx of Plaquenil use, stable presently.   Insomnia secondary to depression with anxiety  takes Mirtazapine  Gastroesophageal reflux disease  stable, on Omeprazole, prn Zofran.   Edema of lower extremity not apparent, takes Furosemide   CKD (chronic kidney disease) stage 3, GFR 30-59 ml/min (HCC) Bun/creat 30/1.3 10/28/21  Hypothyroidism takes Levothyroxine, TSH 4.51  06/30/21     Family/ staff Communication: plan of care reviewed with the patient and charge nurse.   Labs/tests ordered:  none  Time spend 25 minutes.

## 2022-04-21 NOTE — Assessment & Plan Note (Signed)
Blood pressure is controlled,  only on Furosemide.  

## 2022-04-21 NOTE — Assessment & Plan Note (Signed)
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 F/u Ortho. TDWB. Power wc 

## 2022-04-21 NOTE — Assessment & Plan Note (Signed)
04/13/22 Urine culture: E. Coli, susceptible to Amox/Clavulanate 04/16/22 Augmentin 875/125mg  bid x 7days-discontinued 2/2 rash eruption. The patient desires no further tx, she is asymptomatic presently.

## 2022-04-21 NOTE — Assessment & Plan Note (Signed)
not apparent, takes Furosemide  

## 2022-04-21 NOTE — Assessment & Plan Note (Signed)
hx of Plaquenil use, stable presently.

## 2022-04-21 NOTE — Assessment & Plan Note (Signed)
takes Levothyroxine, TSH 4.51 06/30/21 

## 2022-04-21 NOTE — Assessment & Plan Note (Signed)
takes Mirtazapine  

## 2022-04-21 NOTE — Assessment & Plan Note (Signed)
Bun/creat 30/1.3 10/28/21 

## 2022-04-21 NOTE — Assessment & Plan Note (Signed)
stable, on Omeprazole, prn Zofran.  

## 2022-04-21 NOTE — Assessment & Plan Note (Signed)
prn Imodium, Align

## 2022-04-21 NOTE — Assessment & Plan Note (Signed)
post op, s/p EGD no active bleed. S/p 6 u PRBC transfusion, ASA was dc'd, On Fe, Hgb 11.4 10/28/21 

## 2022-04-21 NOTE — Assessment & Plan Note (Signed)
rash diffused all over her body following Augmentin for UTI, the patient stated she can tolerated it w/o tx.

## 2022-04-27 ENCOUNTER — Encounter: Payer: Self-pay | Admitting: Adult Health

## 2022-04-27 ENCOUNTER — Non-Acute Institutional Stay (SKILLED_NURSING_FACILITY): Payer: Medicare Other | Admitting: Adult Health

## 2022-04-27 DIAGNOSIS — L299 Pruritus, unspecified: Secondary | ICD-10-CM | POA: Diagnosis not present

## 2022-04-27 DIAGNOSIS — R062 Wheezing: Secondary | ICD-10-CM | POA: Diagnosis not present

## 2022-04-27 NOTE — Progress Notes (Signed)
Location:  [facility] Room Number: NO/56/A Place of Service:  SNF (31) Provider:  Gillis Santa, FNP  Patient Care Team: Mahlon Gammon, MD as PCP - General (Internal Medicine) [name], MD as PCP - Cardiology (Cardiology) [name], MD as PCP - Sleep Medicine (Cardiology) [name], MD (Sports Medicine) [name], MD (Ophthalmology) [name], [name], MD (Dermatology) [name], MD (Gastroenterology) [name], MD (Nephrology) [name], DC as Referring Physician (Chiropractic Medicine)  Extended Emergency Contact Information Primary Emergency Contact: [facility] Address: [address]          [place], [place] [address] [name] of [place] Home Phone: [phone] Mobile Phone: [phone] Relation: Spouse  Code Status:  DNR Goals of care: Advanced Directive information    04/27/2022   11:36 AM  Advanced Directives  Does Patient Have a Medical Advance Directive? Yes  Type of Advance Directive Living will  Does patient want to make changes to medical advance directive? No - Patient declined     Chief Complaint  Patient presents with   Acute Visit    Patient was having trouble wheezing at night and still itching.    HPI:  Pt is a 82 y.o. female seen today for an acute visit for wheezing at night, still itching. She complains of itching at night on her chest. No noted rashes today. Currently on Loratadine 10 mg daily. She stated that she had wheezing last night but was not short of breath. No wheezing noted today. No reported fever nor chills.    Past Medical History:  Diagnosis Date   Benign hypertension with chronic kidney disease, stage III (HCC)    Overview:  Last Assessment & Plan:  Usually the patient is hypertensive. However today her blood pressure is 105 systolic. She has been feeling fatigued with some lightheadedness. Part of this may be related to her  lower blood pressure. Her pressure may be improved with her decrease in salt and fluid intake. I've instructed her to put her lisinopril/hctz on hold. 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 It was possibly reentrant. She does well with diltiazem. No change in therapy.   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   Thyroid disease    Urinary incontinence    Past Surgical History:  Procedure Laterality Date   CATARACT EXTRACTION Left 8/11   CATARACT EXTRACTION Right 3/12   CHOLECYSTECTOMY  1994   Copsilotomy Laser Treatment Left 6/12   eye   ELBOW SURGERY  8/12   EYE SURGERY Left 09/2008   macular  hole   EYE SURGERY Right 12/11   Lumbar Infusion  2011   MOLE REMOVAL  06/17/2015   TOTAL KNEE ARTHROPLASTY Left 6/11   TOTAL KNEE ARTHROPLASTY Right 06/13/2011    Allergies  Allergen Reactions   Keflex [Cephalexin] Diarrhea   Macrobid [Nitrofurantoin] Hives   Amoxicillin Rash    Outpatient Encounter Medications as of 04/27/2022  Medication Sig   Acetaminophen 325 MG CHEW Chew 650 mg by mouth every 6 (six) hours as needed for pain.   bifidobacterium infantis  (ALIGN) capsule Take 1 capsule by mouth daily.   Calcium Carb-Cholecalciferol (CALCIUM PLUS VITAMIN D3 PO) Take 1 tablet by mouth daily. Vitamin D3 76mcg + Calcium 600   ferrous sulfate 325 (65 FE) MG EC tablet Take 325 mg by mouth. Once A Day on Mon, Thu   furosemide (LASIX) 40 MG tablet Take 40 mg by mouth 2 (two) times daily.   gabapentin (NEURONTIN) 300 MG capsule Take 300 mg by mouth at bedtime.   levothyroxine (SYNTHROID) 25 MCG tablet Take 50 mcg by mouth every other day.   levothyroxine (SYNTHROID) 25 MCG tablet Take 25 mcg by mouth every other day. Alternate with (2)tabs = 50 mcg; oral every other day   loperamide (IMODIUM) 2 MG capsule Take 2 mg by mouth every 4 (four) hours as needed.   mirtazapine (REMERON) 7.5 MG tablet Take 15 mg by mouth at bedtime.   nystatin (MYCOSTATIN/NYSTOP) powder Apply 1 application  topically in the morning, at noon, in the evening, and at bedtime. To genital areas and inner thigh   omeprazole (PRILOSEC) 20 MG capsule Take 20 mg by mouth daily.   ondansetron (ZOFRAN-ODT) 4 MG disintegrating tablet Take 4 mg by mouth every 8 (eight) hours as needed.   potassium chloride SA (KLOR-CON) 20 MEQ tablet Take 20 mEq by mouth daily.   Vitamin D, Ergocalciferol, 50 MCG (2000 UT) CAPS Take 2 capsules by mouth daily.   No facility-administered encounter medications on file as of 04/27/2022.    Review of Systems  Constitutional:  Negative for appetite change, chills, fatigue and fever.  HENT:  Negative for congestion, hearing loss, rhinorrhea and sore throat.   Eyes: Negative.   Respiratory:  Positive for wheezing. Negative for cough and shortness of breath.   Cardiovascular:  Negative for chest pain, palpitations and leg swelling.  Gastrointestinal:  Negative for abdominal pain, constipation, diarrhea, nausea and vomiting.  Genitourinary:  Negative for dysuria.  Musculoskeletal:  Negative for arthralgias, back pain and myalgias.  Skin:  Negative for color change,  rash and wound.  Neurological:  Negative for dizziness, weakness and headaches.  Psychiatric/Behavioral:  Negative for behavioral problems. The patient is not nervous/anxious.     Immunization History  Administered Date(s) Administered   Influenza Split 01/28/2008, 02/17/2009, 02/20/2012, 02/11/2013, 02/02/2016, 01/11/2017, 02/04/2019   Influenza, High Dose Seasonal PF 02/04/2015, 01/11/2017, 01/30/2018, 02/05/2020   Influenza-Unspecified 02/16/2022   Moderna Covid-19 Vaccine Bivalent Booster 57yrs & up 09/10/2021   Moderna SARS-COV2 Booster Vaccination 02/24/2022   Moderna Sars-Covid-2 Vaccination 05/09/2019, 05/27/2019, 03/03/2020, 09/04/2020, 11/04/2020   Pfizer Covid-19 Vaccine Bivalent Booster 90yrs & up 02/09/2021   Pneumococcal Conjugate-13 08/30/2013   Pneumococcal Polysaccharide-23 11/03/2005   Td 08/01/2001, 09/05/2019   Tdap 09/22/2010, 11/24/2020   Zoster Recombinat (Shingrix) 11/08/2016, 01/11/2017   Zoster, Live 11/04/2005, 11/08/2016, 01/11/2017   Zoster, Unspecified 01/11/2017   Pertinent  Health Maintenance Due  Topic Date Due   INFLUENZA VACCINE  Completed   DEXA SCAN  Completed  10/24/2020   11:31 AM 04/15/2022   10:15 AM 04/27/2022   11:35 AM  Fall Risk  Falls in the past year?  0 0  Was there an injury with Fall?  0 0  Fall Risk Category Calculator  0 0  Fall Risk Category  Low Low  Patient Fall Risk Level Moderate fall risk Moderate fall risk Moderate fall risk  Patient at Risk for Falls Due to  History of fall(s) History of fall(s)  Fall risk Follow up  Falls evaluation completed Falls evaluation completed   Functional Status Survey:    Vitals:   04/27/22 1131  BP: 123/70  Pulse: 66  Resp: 18  Temp: 97.9 F (36.6 C)  SpO2: 96%  Weight: 285 lb (129.3 kg)  Height: 5\' 8"  (1.727 m)   Body mass index is 43.33 kg/m. Physical Exam Constitutional:      Appearance: She is obese.     Comments: Morbidly obese.  HENT:     Head: Normocephalic  and atraumatic.     Nose: Nose normal.     Mouth/Throat:     Mouth: Mucous membranes are moist.  Eyes:     Conjunctiva/sclera: Conjunctivae normal.  Cardiovascular:     Rate and Rhythm: Normal rate and regular rhythm.  Pulmonary:     Effort: Pulmonary effort is normal.     Breath sounds: Normal breath sounds.  Abdominal:     General: Bowel sounds are normal.     Palpations: Abdomen is soft.  Musculoskeletal:     Cervical back: Normal range of motion.  Skin:    General: Skin is warm and dry.  Neurological:     Mental Status: She is alert and oriented to person, place, and time. Mental status is at baseline.  Psychiatric:        Mood and Affect: Mood normal.        Behavior: Behavior normal.        Thought Content: Thought content normal.        Judgment: Judgment normal.     Labs reviewed: Recent Labs    06/30/21 0000 10/19/21 0000 10/28/21 0000  NA 140 139 139  K 4.2 4.5 4.3  CL 104 103 104  CO2 26* 29* 30*  BUN 24* 24* 30*  CREATININE 1.2* 1.0 1.3*  CALCIUM 8.9 9.2 8.9   Recent Labs    06/30/21 0000 10/19/21 0000 10/28/21 0000  AST 15 14 14   ALT 9 12 9   ALKPHOS 81 98 84  ALBUMIN 3.4* 3.8 3.3*   Recent Labs    06/30/21 0000 10/19/21 0000 10/28/21 0000  WBC 5.2 7.9 6.2  NEUTROABS 2,392.00 5,340.00 3,063.00  HGB 12.0 13.5 11.4*  HCT 37 40 35*  PLT 265 281 255   Lab Results  Component Value Date   TSH 4.51 06/30/2021   No results found for: "HGBA1C" No results found for: "CHOL", "HDL", "LDLCALC", "LDLDIRECT", "TRIG", "CHOLHDL"  Significant Diagnostic Results in last 30 days:  No results found.  Assessment/Plan  1. Pruritus -  thought to be due to dry skin -  apply Aquaphor ointment topically BID  2. Wheezing -  continue Proventil HFS PRN    Family/ staff Communication:   Discussed plan of care with resident and charge nurse.  Labs/tests ordered:  None

## 2022-05-16 ENCOUNTER — Non-Acute Institutional Stay (SKILLED_NURSING_FACILITY): Payer: Medicare Other | Admitting: Nurse Practitioner

## 2022-05-16 ENCOUNTER — Encounter: Payer: Self-pay | Admitting: Nurse Practitioner

## 2022-05-16 DIAGNOSIS — U071 COVID-19: Secondary | ICD-10-CM

## 2022-05-16 DIAGNOSIS — I10 Essential (primary) hypertension: Secondary | ICD-10-CM | POA: Diagnosis not present

## 2022-05-16 DIAGNOSIS — Z89621 Acquired absence of right hip joint: Secondary | ICD-10-CM

## 2022-05-16 DIAGNOSIS — F418 Other specified anxiety disorders: Secondary | ICD-10-CM

## 2022-05-16 DIAGNOSIS — R600 Localized edema: Secondary | ICD-10-CM

## 2022-05-16 DIAGNOSIS — K2100 Gastro-esophageal reflux disease with esophagitis, without bleeding: Secondary | ICD-10-CM

## 2022-05-16 DIAGNOSIS — D500 Iron deficiency anemia secondary to blood loss (chronic): Secondary | ICD-10-CM

## 2022-05-16 DIAGNOSIS — F5105 Insomnia due to other mental disorder: Secondary | ICD-10-CM

## 2022-05-16 DIAGNOSIS — M069 Rheumatoid arthritis, unspecified: Secondary | ICD-10-CM

## 2022-05-16 NOTE — Assessment & Plan Note (Signed)
COVID infection, fatigue, nasal congestion, started Paxlovid today.

## 2022-05-16 NOTE — Assessment & Plan Note (Signed)
Blood pressure is controlled,  only on Furosemide.

## 2022-05-16 NOTE — Assessment & Plan Note (Signed)
post op, s/p EGD no active bleed. S/p 6 u PRBC transfusion, ASA was dc'd, On Fe, Hgb 11.4 10/28/21 

## 2022-05-16 NOTE — Progress Notes (Signed)
Location:   [facility] Room Number: [place] of Service:  [id number]) Provider: Lennie Odor Kierston Plasencia NP  Virgie Dad, MD  Patient Care Team: Virgie Dad, MD as PCP - General (Internal Medicine) Dorothy Spark, MD as PCP - Cardiology (Cardiology) Sueanne Margarita, MD as PCP - Sleep Medicine (Cardiology) Donzetta Sprung., MD (Sports Medicine) Linward Natal, MD (Ophthalmology) [name], Katharine Look, MD (Dermatology) Ronald Lobo, MD (Gastroenterology) Thereasa Distance, MD (Nephrology) Jola Baptist, [place] as Referring Physician (Chiropractic Medicine)  Extended Emergency Contact Information Primary Emergency Contact: [facility] Address: [address]          [place], [place] [address] [name] of [place] Phone: [phone] Mobile Phone: [phone] Relation: Spouse  Code Status: DNR Goals of care: Advanced Directive information    05/17/2022   10:41 AM  Advanced Directives  Does Patient Have a Medical Advance Directive? Yes  Type of Advance Directive Living will;Out of facility DNR (pink MOST or yellow form)  Does patient want to make changes to medical advance directive? No - Patient declined     Chief Complaint  Patient presents with   Acute Visit    COVID infection    HPI:  Pt is a 82 y.o. female seen today for an acute visit for COVID infection, fatigue, nasal congestion, started Paxlovid today.   Elevated AST/ALT normalized, S/p cholecystectomy, [place] 02/19/21 no cyst or mass.               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 F/u Ortho. TDWB. 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 Fully treated, off antibiotics.              Anemia, post op, s/p EGD no active bleed. S/p 6 u PRBC transfusion, ASA was dc'd, On Fe, Hgb 11.4 10/28/21             HTN only on Furosemide.              Morbid obesity             OSA CPAP             Chronic  diarrhea, prn Imodium, Align             RA f/u Rheumatology, hx of Plaquenil use, stable presently.              Insomnia/depression/anxiety, takes Mirtazapine             GERD, stable, on Omeprazole, prn Zofran.              Edema, not apparent, takes Furosemide  CKD stage 3 Bun/creat 30/1.3 10/28/21             Hypothyroidism, takes Levothyroxine, TSH 4.51 06/30/21                Past Medical History:  Diagnosis Date   Benign hypertension with chronic kidney disease, stage III ([place])    Overview:  Last Assessment & Plan:  Usually the patient is hypertensive. However today her blood pressure is 284 systolic. She has been feeling fatigued with some lightheadedness. Part of this may be related to her lower blood pressure. Her pressure may be improved with her decrease in salt and fluid intake. I've instructed her to put her lisinopril/hctz on hold. I've asked her to  see her primary    Cardiac disease 03/10/2014   Chronic diarrhea 07/27/2015   Edema 07/27/2015   Ejection fraction 2004   Normal, echo,    Gastroesophageal reflux disease    GERD (gastroesophageal reflux disease)    Heart disease 03/10/2014   Hypertension    Left bundle branch block 09/01/2020   Obesity    Obesity (BMI 30-39.9) 09/25/2017   OSA (obstructive sleep apnea)    mild with AHI 6.75 - on CPAP   Preop cardiovascular exam 11/2010   Cardiac clearance for knee surgery    Sleep apnea 03/10/2014   Supraventricular tachycardia    Documented episode in the past, possibly reentrant tachycardia  Overview:  Overview:  Documented episode in the past, 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 It was possibly reentrant. She does well with diltiazem. No change in therapy.   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   Thyroid disease    Urinary incontinence    Past Surgical  History:  Procedure Laterality Date   CATARACT EXTRACTION Left 8/11   CATARACT EXTRACTION Right 3/12   CHOLECYSTECTOMY  1994   Copsilotomy Laser Treatment Left 6/12   eye   ELBOW SURGERY  8/12   EYE SURGERY Left 09/2008   macular hole   EYE SURGERY Right 12/11   Lumbar Infusion  2011   MOLE REMOVAL  06/17/2015   TOTAL KNEE ARTHROPLASTY Left 6/11   TOTAL KNEE ARTHROPLASTY Right 06/13/2011    Allergies  Allergen Reactions   Keflex [Cephalexin] Diarrhea   Macrobid [Nitrofurantoin] Hives   Amoxicillin Rash    Allergies as of 05/16/2022       Reactions   Keflex [cephalexin] Diarrhea   Macrobid [nitrofurantoin] Hives   Amoxicillin Rash        Medication List        Accurate as of May 16, 2022 11:59 PM. If you have any questions, ask your nurse or doctor.          Acetaminophen 325 MG Chew Chew 650 mg by mouth every 6 (six) hours as needed for pain.   albuterol 108 (90 Base) MCG/ACT inhaler Commonly known as: VENTOLIN HFA Inhale 2 puffs into the lungs every 6 (six) hours as needed for wheezing or shortness of breath.   bifidobacterium infantis capsule Take 1 capsule by mouth daily.   CALCIUM PLUS VITAMIN D3 PO Take 1 tablet by mouth daily. Vitamin D3 48mcg + Calcium 600   ferrous sulfate 325 (65 FE) MG EC tablet Take 325 mg by mouth. Once A Day on Mon, Thu   furosemide 40 MG tablet Commonly known as: LASIX Take 40 mg by mouth 2 (two) times daily.   gabapentin 300 MG capsule Commonly known as: NEURONTIN Take 300 mg by mouth at bedtime.   levothyroxine 25 MCG tablet Commonly known as: SYNTHROID Take 25 mcg by mouth. one time a day every 2 day(s)   levothyroxine 25 MCG tablet Commonly known as: SYNTHROID Take 50 mcg by mouth. one time a day every 2 day(s)   loperamide 2 MG capsule Commonly known as: IMODIUM Take 2 mg by mouth every 4 (four) hours as needed.   mineral oil-hydrophilic petrolatum ointment Apply 1 Application topically as needed  for dry skin. Apply to (L) arm &Chest topically two times a day for Itching;Rash related to ALLERGIC CONTACT DERMATITIS DU  mirtazapine 7.5 MG tablet Commonly known as: REMERON Take 15 mg by mouth at bedtime.   nystatin powder Commonly known as: MYCOSTATIN/NYSTOP Apply 1 application  topically in the morning, at noon, in the evening, and at bedtime. To genital areas and inner thigh   omeprazole 20 MG capsule Commonly known as: PRILOSEC Take 20 mg by mouth daily.   ondansetron 4 MG disintegrating tablet Commonly known as: ZOFRAN-ODT Take 4 mg by mouth every 8 (eight) hours as needed.   PAXLOVID (150/100) PO Take by mouth.  Give 2 tablet by mouth two times a day for Covid -19 (U07.1) until 05/20/2022 23:59 Give (1) 150mg  tab, and (1) 100mg  tab to equal a total dose of 250mg  twice daily x 5days.   potassium chloride SA 20 MEQ tablet Commonly known as: KLOR-CON M Take 20 mEq by mouth daily.   Vitamin D (Ergocalciferol) 50 MCG (2000 UT) Caps Take 2 capsules by mouth daily.        Review of Systems  Constitutional:  Positive for fatigue. Negative for fever and unexpected weight change.  HENT:  Positive for congestion and sore throat. Negative for trouble swallowing.   Eyes:  Negative for visual disturbance.  Respiratory:  Negative for cough.   Cardiovascular:  Negative for leg swelling.  Gastrointestinal:  Negative for abdominal pain and constipation.  Genitourinary:  Negative for dysuria, frequency and urgency.  Musculoskeletal:  Positive for arthralgias and gait problem.  Skin:  Negative for color change.       Diffused drug rash arms, legs, chest, back.   Neurological:  Negative for speech difficulty, weakness and headaches.  Psychiatric/Behavioral:  Negative for confusion and sleep disturbance. The patient is not nervous/anxious.     Immunization History  Administered Date(s) Administered   Influenza Split 01/28/2008, 02/17/2009, 02/20/2012, 02/11/2013, 02/02/2016,  01/11/2017, 02/04/2019   Influenza, High Dose Seasonal PF 02/04/2015, 01/11/2017, 01/30/2018, 02/05/2020   Influenza-Unspecified 02/16/2022   Moderna Covid-19 Vaccine Bivalent Booster 75yrs & up 09/10/2021   Moderna SARS-COV2 Booster Vaccination 02/24/2022   Moderna Sars-Covid-2 Vaccination 05/09/2019, 05/27/2019, 03/03/2020, 09/04/2020, 11/04/2020   Pfizer Covid-19 Vaccine Bivalent Booster 58yrs & up 02/09/2021   Pneumococcal Conjugate-13 08/30/2013   Pneumococcal Polysaccharide-23 11/03/2005   Td 08/01/2001, 09/05/2019   Tdap 09/22/2010, 11/24/2020   Zoster Recombinat (Shingrix) 11/08/2016, 01/11/2017   Zoster, Live 11/04/2005, 11/08/2016, 01/11/2017   Zoster, Unspecified 01/11/2017   Pertinent  Health Maintenance Due  Topic Date Due   INFLUENZA VACCINE  Completed   DEXA SCAN  Completed      10/24/2020   11:31 AM 04/15/2022   10:15 AM 04/27/2022   11:35 AM  Fall Risk  Falls in the past year?  0 0  Was there an injury with Fall?  0 0  Fall Risk Category Calculator  0 0  Fall Risk Category (Retired)  Low Low  (RETIRED) Patient Fall Risk Level Moderate fall risk Moderate fall risk Moderate fall risk  Patient at Risk for Falls Due to  History of fall(s) History of fall(s)  Fall risk Follow up  Falls evaluation completed Falls evaluation completed   Functional Status Survey:    Vitals:   05/16/22 1601  BP: 123/70  Pulse: 66  Resp: 18  Temp: 97.9 F (36.6 C)  SpO2: 96%  Weight: 285 lb (129.3 kg)  Height: 5\' 8"  (1.727 m)   Body mass index is 43.33 kg/m. Physical Exam Vitals and nursing note reviewed.  Constitutional:      Appearance: She is obese.  HENT:     Head: Normocephalic and atraumatic.     Nose: Congestion and rhinorrhea present.     Mouth/Throat:     Mouth: Mucous membranes are moist.     Pharynx: No oropharyngeal exudate or posterior oropharyngeal erythema.  Eyes:     Extraocular Movements: Extraocular movements intact.     Conjunctiva/sclera:  Conjunctivae normal.     Pupils: Pupils are equal, round, and reactive to light.  Cardiovascular:     Rate and Rhythm: Normal rate and regular rhythm.     Heart sounds: No murmur heard. Pulmonary:     Effort: Pulmonary effort is normal.  Abdominal:     General: Bowel sounds are normal.     Palpations: Abdomen is soft.     Tenderness: There is no abdominal tenderness.  Genitourinary:    Comments: No urogenital irritation or skin breakdown Musculoskeletal:     Cervical back: Normal range of motion and neck supple.     Right lower leg: No edema.     Left lower leg: No edema.     Comments: R hip, prosthetic hip removed.   Skin:    General: Skin is warm and dry.     Comments: Right hip surgical scar.   Neurological:     General: No focal deficit present.     Mental Status: She is alert and oriented to person, place, and time.     Gait: Gait normal.  Psychiatric:        Mood and Affect: Mood normal.        Behavior: Behavior normal.        Thought Content: Thought content normal.        Judgment: Judgment normal.     Labs reviewed: Recent Labs    06/30/21 0000 10/19/21 0000 10/28/21 0000  NA 140 139 139  K 4.2 4.5 4.3  CL 104 103 104  CO2 26* 29* 30*  BUN 24* 24* 30*  CREATININE 1.2* 1.0 1.3*  CALCIUM 8.9 9.2 8.9   Recent Labs    06/30/21 0000 10/19/21 0000 10/28/21 0000  AST 15 14 14   ALT 9 12 9   ALKPHOS 81 98 84  ALBUMIN 3.4* 3.8 3.3*   Recent Labs    06/30/21 0000 10/19/21 0000 10/28/21 0000  WBC 5.2 7.9 6.2  NEUTROABS 2,392.00 5,340.00 3,063.00  HGB 12.0 13.5 11.4*  HCT 37 40 35*  PLT 265 281 255   Lab Results  Component Value Date   TSH 4.51 06/30/2021   No results found for: "HGBA1C" No results found for: "CHOL", "HDL", "LDLCALC", "LDLDIRECT", "TRIG", "CHOLHDL"  Significant Diagnostic Results in last 30 days:  No results found.  Assessment/Plan: COVID-19 virus infection COVID infection, fatigue, nasal congestion, started Paxlovid today.     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 F/u Ortho. TDWB. 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 Fully treated, off antibiotics.   Blood loss anemia  post op, s/p EGD no active bleed. S/p 6 u PRBC transfusion, ASA was dc'd, On Fe, Hgb 11.4 10/28/21  Benign essential HTN Blood pressure is controlled,  only on Furosemide.   Rheumatoid arthritis (HCC)    RA f/u Rheumatology, hx of Plaquenil use, stable presently.   Insomnia secondary to depression with anxiety    Insomnia/depression/anxiety, hold Mirtazapine while taking Paxlovid.   Gastroesophageal  reflux disease stable, on Omeprazole, prn Zofran.   Edema of lower extremity Stable, continue Furosemide.     Family/ staff Communication: plan of care reviewed with the patient and charge nurse.   Labs/tests ordered:  none  Time spend 35 minutes.

## 2022-05-16 NOTE — Assessment & Plan Note (Signed)
stable, on Omeprazole, prn Zofran.  

## 2022-05-16 NOTE — Assessment & Plan Note (Signed)
Stable, continue Furosemide.  

## 2022-05-16 NOTE — Assessment & Plan Note (Signed)
RA f/u Rheumatology, hx of Plaquenil use, stable presently.  

## 2022-05-16 NOTE — Assessment & Plan Note (Signed)
Insomnia/depression/anxiety, hold Mirtazapine while taking Paxlovid.

## 2022-05-16 NOTE — Assessment & Plan Note (Signed)
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 F/u Ortho. TDWB. 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 Fully treated, off antibiotics.  

## 2022-05-17 ENCOUNTER — Encounter: Payer: Self-pay | Admitting: Nurse Practitioner

## 2022-05-17 ENCOUNTER — Encounter: Payer: Self-pay | Admitting: Family Medicine

## 2022-05-17 ENCOUNTER — Non-Acute Institutional Stay (SKILLED_NURSING_FACILITY): Payer: Medicare Other | Admitting: Family Medicine

## 2022-05-17 DIAGNOSIS — G4733 Obstructive sleep apnea (adult) (pediatric): Secondary | ICD-10-CM

## 2022-05-17 DIAGNOSIS — F5105 Insomnia due to other mental disorder: Secondary | ICD-10-CM

## 2022-05-17 DIAGNOSIS — U071 COVID-19: Secondary | ICD-10-CM

## 2022-05-17 DIAGNOSIS — I10 Essential (primary) hypertension: Secondary | ICD-10-CM | POA: Diagnosis not present

## 2022-05-17 DIAGNOSIS — N1831 Chronic kidney disease, stage 3a: Secondary | ICD-10-CM | POA: Diagnosis not present

## 2022-05-17 DIAGNOSIS — Z89621 Acquired absence of right hip joint: Secondary | ICD-10-CM

## 2022-05-17 DIAGNOSIS — F418 Other specified anxiety disorders: Secondary | ICD-10-CM

## 2022-05-17 NOTE — Progress Notes (Unsigned)
Location:  [facility] Room Number: 56-A Place of Service:  SNF [phone]) Provider:  Gevena Cotton, MD  Patient Care Team: Mahlon Gammon, MD as PCP - General (Internal Medicine) [name], MD as PCP - Cardiology (Cardiology) [name], MD as PCP - Sleep Medicine (Cardiology) [name], MD (Sports Medicine) [name], MD (Ophthalmology) [name], [name], MD (Dermatology) [name], MD (Gastroenterology) [name], MD (Nephrology) [name], DC as Referring Physician (Chiropractic Medicine)  Extended Emergency Contact Information Primary Emergency Contact: [facility] Address: [address]          [place], [place] [address] [name] of [place] Home Phone: [phone] Mobile Phone: [phone] Relation: Spouse  Code Status:  DNR Goals of care: Advanced Directive information    05/17/2022   10:41 AM  Advanced Directives  Does Patient Have a Medical Advance Directive? Yes  Type of Advance Directive Living will;Out of facility DNR (pink MOST or yellow form)  Does patient want to make changes to medical advance directive? No - Patient declined     Chief Complaint  Patient presents with   Routine    HPI:  Pt is a 82 y.o. female seen today for medical management of chronic diseases.  She is in skilled care because she is unable to complete ADLs due to her absence of right hip.  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  She was fully treated with antibiotics but the prosthetic joint was removed and now she is unable to walk.  She does well getting around in a power chair but requires help with bathing dressing etc.  Her husband lives here and a apartment and visits daily. She has had issues recently with urinary tract infection, hypertension, obstructive sleep apnea, insomnia and depression, hypothyroidism. Only she has been diagnosed with COVID.  She  denies fever or cough but had sore throat and upper airway congestion.  She is now confined to her room for period of 10 days.  She is not accustomed to this and will have a difficult time until she is able to get out and socialize.   Past Medical History:  Diagnosis Date   Benign hypertension with chronic kidney disease, stage III (HCC)    Overview:  Last Assessment & Plan:  Usually the patient is hypertensive. However today her blood pressure is 105 systolic. She has been feeling fatigued with some lightheadedness. Part of this may be related to her lower blood pressure. Her pressure may be improved with her decrease in salt and fluid intake. I've instructed her to put her lisinopril/hctz on hold. I've asked her to see her primary    Cardiac disease 03/10/2014   Chronic diarrhea 07/27/2015   Edema 07/27/2015   Ejection fraction 2004   Normal, echo,    Gastroesophageal reflux disease    GERD (gastroesophageal reflux disease)    Heart disease 03/10/2014   Hypertension    Left bundle branch block 09/01/2020   Obesity    Obesity (BMI 30-39.9) 09/25/2017   OSA (obstructive sleep apnea)    mild with AHI 6.75 - on CPAP   Preop cardiovascular exam 11/2010   Cardiac clearance for knee surgery    Sleep apnea 03/10/2014   Supraventricular tachycardia    Documented episode in the past, possibly reentrant tachycardia  Overview:  Overview:  Documented episode in the past, possibly reentrant tachycardia  Last Assessment & Plan:  The patient  had a documented episode in the past of a supraventricular tachycardia. It was possibly reentrant. She does well with diltiazem. No change in therapy.   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   Thyroid disease    Urinary incontinence    Past Surgical History:  Procedure Laterality Date   CATARACT EXTRACTION Left 8/11   CATARACT EXTRACTION Right 3/12   CHOLECYSTECTOMY  1994   Copsilotomy Laser  Treatment Left 6/12   eye   ELBOW SURGERY  8/12   EYE SURGERY Left 09/2008   macular hole   EYE SURGERY Right 12/11   Lumbar Infusion  2011   MOLE REMOVAL  06/17/2015   TOTAL KNEE ARTHROPLASTY Left 6/11   TOTAL KNEE ARTHROPLASTY Right 06/13/2011    Allergies  Allergen Reactions   Keflex [Cephalexin] Diarrhea   Macrobid [Nitrofurantoin] Hives   Amoxicillin Rash    Outpatient Encounter Medications as of 05/17/2022  Medication Sig   acetaminophen (TYLENOL) 325 MG tablet Take 650 mg by mouth every 6 (six) hours as needed.   albuterol (VENTOLIN HFA) 108 (90 Base) MCG/ACT inhaler Inhale 2 puffs into the lungs every 6 (six) hours as needed for wheezing or shortness of breath.   Ascorbic Acid (VITAMIN C) 1000 MG tablet Take 1,000 mg by mouth daily. for Covid-19   bifidobacterium infantis (ALIGN) capsule Take 1 capsule by mouth daily.   Calcium Carb-Cholecalciferol (CALCIUM PLUS VITAMIN D3 PO) Take 1 tablet by mouth daily. Vitamin D3 53mcg + Calcium 600   ferrous sulfate 325 (65 FE) MG EC tablet Take 325 mg by mouth. Once A Day on Mon, Thu   furosemide (LASIX) 40 MG tablet Take 40 mg by mouth 2 (two) times daily.   gabapentin (NEURONTIN) 300 MG capsule Take 300 mg by mouth at bedtime.   levothyroxine (SYNTHROID) 25 MCG tablet Take 50 mcg by mouth. one time a day every 2 day(s)   levothyroxine (SYNTHROID) 25 MCG tablet Take 25 mcg by mouth. one time a day every 2 day(s)   loperamide (IMODIUM) 2 MG capsule Take 2 mg by mouth every 4 (four) hours as needed.   mineral oil-hydrophilic petrolatum (AQUAPHOR) ointment Apply 1 Application topically as needed for dry skin. Apply to (L) arm &Chest topically two times a day for Itching;Rash related to ALLERGIC CONTACT DERMATITIS DU   mirtazapine (REMERON) 7.5 MG tablet Take 15 mg by mouth at bedtime.   Nirmatrelvir-Ritonavir (PAXLOVID, 150/100, PO) Take by mouth.  Give 2 tablet by mouth two times a day for Covid -19 (U07.1) until 05/20/2022 23:59 Give (1)  150mg  tab, and (1) 100mg  tab to equal a total dose of 250mg  twice daily x 5days.   nystatin (MYCOSTATIN/NYSTOP) powder Apply 1 application  topically in the morning, at noon, in the evening, and at bedtime. To genital areas and inner thigh   omeprazole (PRILOSEC) 20 MG capsule Take 20 mg by mouth daily.   ondansetron (ZOFRAN-ODT) 4 MG disintegrating tablet Take 4 mg by mouth every 8 (eight) hours as needed.   potassium chloride SA (KLOR-CON) 20 MEQ tablet Take 20 mEq by mouth daily.   Vitamin D, Ergocalciferol, 50 MCG (2000 UT) CAPS Take 2 capsules by mouth daily.   Zinc 50 MG TABS Take 50 mg by mouth daily. for Covid-19   [DISCONTINUED] Acetaminophen 325 MG CHEW Chew 650 mg by mouth every 6 (six) hours as needed for pain.   No facility-administered encounter medications on file as of 05/17/2022.  Review of Systems  Constitutional:  Positive for fatigue.  HENT:  Positive for congestion and sore throat.   Respiratory: Negative.    Cardiovascular: Negative.   Gastrointestinal:  Positive for diarrhea.  Musculoskeletal:  Positive for gait problem.  Psychiatric/Behavioral:  The patient is nervous/anxious.   All other systems reviewed and are negative.   Immunization History  Administered Date(s) Administered   Influenza Split 01/28/2008, 02/17/2009, 02/20/2012, 02/11/2013, 02/02/2016, 01/11/2017, 02/04/2019   Influenza, High Dose Seasonal PF 02/04/2015, 01/11/2017, 01/30/2018, 02/05/2020   Influenza-Unspecified 02/16/2022   Moderna Covid-19 Vaccine Bivalent Booster 25yrs & up 09/10/2021   Moderna SARS-COV2 Booster Vaccination 02/24/2022   Moderna Sars-Covid-2 Vaccination 05/09/2019, 05/27/2019, 03/03/2020, 09/04/2020, 11/04/2020   Pfizer Covid-19 Vaccine Bivalent Booster 12yrs & up 02/09/2021   Pneumococcal Conjugate-13 08/30/2013   Pneumococcal Polysaccharide-23 11/03/2005   Td 08/01/2001, 09/05/2019   Tdap 09/22/2010, 11/24/2020   Zoster Recombinat (Shingrix) 11/08/2016, 01/11/2017    Zoster, Live 11/04/2005, 11/08/2016, 01/11/2017   Zoster, Unspecified 01/11/2017   Pertinent  Health Maintenance Due  Topic Date Due   INFLUENZA VACCINE  Completed   DEXA SCAN  Completed      10/24/2020   11:31 AM 04/15/2022   10:15 AM 04/27/2022   11:35 AM  [place] in the past year?  0 0  Was there an injury with Fall?  0 0  Fall Risk Category Calculator  0 0  Fall Risk Category (Retired)  Low Low  (RETIRED) Patient Fall Risk Level Moderate fall risk Moderate fall risk Moderate fall risk  Patient at Risk for Falls Due to  History of fall(s) History of fall(s)  Fall risk Follow up  Falls evaluation completed Falls evaluation completed   Functional Status Survey:    Vitals:   05/17/22 1039  BP: 123/70  Pulse: 66  Resp: 18  Temp: 97.9 F (36.6 C)  SpO2: 96%  Weight: 285 lb (129.3 kg)  Height: 5\' 8"  (1.727 m)   Body mass index is 43.33 kg/m. Physical Exam Vitals and nursing note reviewed.  Constitutional:      Appearance: She is obese.  HENT:     Head: Normocephalic.     Nose: Nose normal.     Mouth/Throat:     Pharynx: Posterior oropharyngeal erythema present.  Eyes:     Extraocular Movements: Extraocular movements intact.     Pupils: Pupils are equal, round, and reactive to light.  Cardiovascular:     Rate and Rhythm: Normal rate and regular rhythm.  Pulmonary:     Effort: Pulmonary effort is normal.     Breath sounds: Normal breath sounds.  Abdominal:     General: Bowel sounds are normal.     Palpations: Abdomen is soft.  Musculoskeletal:     Comments: Unable to walk secondary to absence of right hip joint  Neurological:     General: No focal deficit present.     Mental Status: She is alert and oriented to person, place, and time.     Labs reviewed: Recent Labs    06/30/21 0000 10/19/21 0000 10/28/21 0000  NA 140 139 139  K 4.2 4.5 4.3  CL 104 103 104  CO2 26* 29* 30*  BUN 24* 24* 30*  CREATININE 1.2* 1.0 1.3*  CALCIUM 8.9 9.2 8.9    Recent Labs    06/30/21 0000 10/19/21 0000 10/28/21 0000  AST 15 14 14   ALT 9 12 9   ALKPHOS 81 98 84  ALBUMIN 3.4* 3.8 3.3*  Recent Labs    06/30/21 0000 10/19/21 0000 10/28/21 0000  WBC 5.2 7.9 6.2  NEUTROABS 2,392.00 5,340.00 3,063.00  HGB 12.0 13.5 11.4*  HCT 37 40 35*  PLT 265 281 255   Lab Results  Component Value Date   TSH 4.51 06/30/2021   No results found for: "HGBA1C" No results found for: "CHOL", "HDL", "LDLCALC", "LDLDIRECT", "TRIG", "CHOLHDL"  Significant Diagnostic Results in last 30 days:  No results found.  Assessment/Plan 1. Absence of hip joint, right This obviously increases her need for lots of help.  I believe skilled nursing is appropriate place for her now  2. Benign essential HTN Blood pressure has been okay only on furosemide  3. Stage 3a chronic kidney disease (HCC)  Creatinine varies from 1.0-1.4 over the past year 4. COVID-19 virus infection Currently receiving Paxlovid and quarantine in room  5. Insomnia secondary to depression with anxiety Continues on mirtazapine low-dose.  At this dose is doing very little for depression but it may open insomnia  6. OSA (obstructive sleep apnea) Continue with CPAP    Family/ staff Communication:   Labs/tests ordered:    Lillette Boxer. Sabra Heck, [place] [address] [place], [place] Office [phone]

## 2022-05-31 ENCOUNTER — Encounter: Payer: Self-pay | Admitting: Nurse Practitioner

## 2022-05-31 ENCOUNTER — Non-Acute Institutional Stay (SKILLED_NURSING_FACILITY): Payer: Medicare Other | Admitting: Nurse Practitioner

## 2022-05-31 DIAGNOSIS — F5105 Insomnia due to other mental disorder: Secondary | ICD-10-CM

## 2022-05-31 DIAGNOSIS — R600 Localized edema: Secondary | ICD-10-CM

## 2022-05-31 DIAGNOSIS — B372 Candidiasis of skin and nail: Secondary | ICD-10-CM

## 2022-05-31 DIAGNOSIS — Z89621 Acquired absence of right hip joint: Secondary | ICD-10-CM

## 2022-05-31 DIAGNOSIS — F418 Other specified anxiety disorders: Secondary | ICD-10-CM

## 2022-05-31 DIAGNOSIS — K2100 Gastro-esophageal reflux disease with esophagitis, without bleeding: Secondary | ICD-10-CM

## 2022-05-31 DIAGNOSIS — I10 Essential (primary) hypertension: Secondary | ICD-10-CM

## 2022-05-31 DIAGNOSIS — D500 Iron deficiency anemia secondary to blood loss (chronic): Secondary | ICD-10-CM | POA: Diagnosis not present

## 2022-05-31 DIAGNOSIS — E039 Hypothyroidism, unspecified: Secondary | ICD-10-CM

## 2022-05-31 NOTE — Assessment & Plan Note (Signed)
Blood pressure is controlled, only on Furosemide.

## 2022-05-31 NOTE — Progress Notes (Signed)
Location:  [place] Room Number: 88 Place of Service:  SNF (31) Provider:  Kunal Levario X, NP  Patient Care Team: Virgie Dad, MD as PCP - General (Internal Medicine) Dorothy Spark, MD as PCP - Cardiology (Cardiology) Sueanne Margarita, MD as PCP - Sleep Medicine (Cardiology) Donzetta Sprung., MD (Sports Medicine) Linward Natal, MD (Ophthalmology) [name], Katharine Look, MD (Dermatology) Ronald Lobo, MD (Gastroenterology) Thereasa Distance, MD (Nephrology) Jola Baptist, [place] as Referring Physician (Chiropractic Medicine)  Extended Emergency Contact Information Primary Emergency Contact: [facility] Address: [address]          [place], [place] [address] [name] of [place] Phone: [phone] Mobile Phone: [phone] Relation: Spouse  Code Status:  DNR  Goals of care: Advanced Directive information    05/31/2022    9:35 AM  Advanced Directives  Does Patient Have a Medical Advance Directive? Yes  Type of Advance Directive Out of facility DNR (pink MOST or yellow form);Living will  Does patient want to make changes to medical advance directive? No - Patient declined     Chief Complaint  Patient presents with   Medical Management of Chronic Issues    Routine Visit   Quality Metric Gaps    Needs to discuss Covid Vaccine    HPI:  Pt is a 82 y.o. female seen today for medical management of chronic diseases.     Elevated AST/ALT normalized, S/p cholecystectomy, [place] 02/19/21 no cyst or mass.               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 F/u Ortho. TDWB. 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 Fully treated, off antibiotics.              Anemia, post op, s/p EGD no active bleed. S/p 6 u PRBC transfusion, ASA was dc'd, On Fe, Hgb 11.4 10/28/21             HTN only on Furosemide.              Morbid obesity             OSA  CPAP             Chronic diarrhea, prn Imodium, Align             RA f/u Rheumatology, hx of Plaquenil use, stable presently.              Insomnia/depression/anxiety, takes Mirtazapine             GERD, stable, on Omeprazole, prn Zofran.              Edema, not apparent, takes Furosemide  CKD stage 3 Bun/creat 30/1.3 10/28/21             Hypothyroidism, takes Levothyroxine, TSH 4.51 06/30/21              Past Medical History:  Diagnosis Date   Benign hypertension with chronic kidney disease, stage III ([facility])    Overview:  Last Assessment & Plan:  Usually the patient is hypertensive. However today her blood pressure is 657 systolic. She has been feeling fatigued with some lightheadedness. Part of this may be related to her lower blood pressure. Her pressure may be improved with her decrease in salt and fluid intake. I've instructed her to  put her lisinopril/hctz on hold. 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 It was possibly reentrant. She does well with diltiazem. No change in therapy.   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   Thyroid disease    Urinary  incontinence    Past Surgical History:  Procedure Laterality Date   CATARACT EXTRACTION Left 8/11   CATARACT EXTRACTION Right 3/12   CHOLECYSTECTOMY  1994   Copsilotomy Laser Treatment Left 6/12   eye   ELBOW SURGERY  8/12   EYE SURGERY Left 09/2008   macular hole   EYE SURGERY Right 12/11   Lumbar Infusion  2011   MOLE REMOVAL  06/17/2015   TOTAL KNEE ARTHROPLASTY Left 6/11   TOTAL KNEE ARTHROPLASTY Right 06/13/2011    Allergies  Allergen Reactions   Keflex [Cephalexin] Diarrhea   Macrobid [Nitrofurantoin] Hives   Amoxicillin Rash    Outpatient Encounter Medications as of 05/31/2022  Medication Sig   acetaminophen (TYLENOL) 325 MG tablet Take 650 mg by mouth every 6 (six) hours as needed.   albuterol (VENTOLIN HFA) 108 (90 Base) MCG/ACT inhaler Inhale 2 puffs into the lungs every 6 (six) hours as needed for wheezing or shortness of breath.   bifidobacterium infantis (ALIGN) capsule Take 1 capsule by mouth daily.   Calcium Carb-Cholecalciferol (CALCIUM PLUS VITAMIN D3 PO) Take 1 tablet by mouth daily. Vitamin D3 76mcg + Calcium 600   ferrous sulfate 325 (65 FE) MG EC tablet Take 325 mg by mouth. Once A Day on Mon, Thu   furosemide (LASIX) 40 MG tablet Take 40 mg by mouth 2 (two) times daily.   gabapentin (NEURONTIN) 300 MG capsule Take 300 mg by mouth at bedtime.   levothyroxine (SYNTHROID) 25 MCG tablet Take 50 mcg by mouth. one time a day every 2 day(s)   levothyroxine (SYNTHROID) 25 MCG tablet Take 25 mcg by mouth. one time a day every 2 day(s)   loperamide (IMODIUM) 2 MG capsule Take 2 mg by mouth every 4 (four) hours as needed.   mineral oil-hydrophilic petrolatum (AQUAPHOR) ointment Apply 1 Application topically as needed for dry skin. Apply to (L) arm &Chest topically two times a day for Itching;Rash related to ALLERGIC CONTACT DERMATITIS DU   nystatin (MYCOSTATIN/NYSTOP) powder Apply 1 application  topically in the morning, at noon, in the evening, and at bedtime. To  genital areas and inner thigh   omeprazole (PRILOSEC) 20 MG capsule Take 20 mg by mouth daily.   ondansetron (ZOFRAN-ODT) 4 MG disintegrating tablet Take 4 mg by mouth every 8 (eight) hours as needed.   potassium chloride SA (  KLOR-CON) 20 MEQ tablet Take 20 mEq by mouth daily.   Vitamin D, Ergocalciferol, 50 MCG (2000 UT) CAPS Take 2 capsules by mouth daily.   [DISCONTINUED] Ascorbic Acid (VITAMIN C) 1000 MG tablet Take 1,000 mg by mouth daily. for Covid-19   [DISCONTINUED] mirtazapine (REMERON) 7.5 MG tablet Take 15 mg by mouth at bedtime.   [DISCONTINUED] Nirmatrelvir-Ritonavir (PAXLOVID, 150/100, PO) Take by mouth.  Give 2 tablet by mouth two times a day for Covid -19 (U07.1) until 05/20/2022 23:59 Give (1) 150mg  tab, and (1) 100mg  tab to equal a total dose of 250mg  twice daily x 5days.   [DISCONTINUED] Zinc 50 MG TABS Take 50 mg by mouth daily. for Covid-19   No facility-administered encounter medications on file as of 05/31/2022.    Review of Systems  Constitutional:  Negative for appetite change, fatigue and fever.  HENT:  Negative for congestion, sore throat and trouble swallowing.   Eyes:  Negative for visual disturbance.  Respiratory:  Negative for cough.   Cardiovascular:  Negative for leg swelling.  Gastrointestinal:  Negative for abdominal pain and constipation.  Genitourinary:  Negative for dysuria, frequency and urgency.  Musculoskeletal:  Positive for arthralgias and gait problem.  Skin:  Positive for rash. Negative for color change.  Neurological:  Negative for speech difficulty and weakness.  Psychiatric/Behavioral:  Negative for confusion and sleep disturbance. The patient is not nervous/anxious.     Immunization History  Administered Date(s) Administered   Influenza Split 01/28/2008, 02/17/2009, 02/20/2012, 02/11/2013, 02/02/2016, 01/11/2017, 02/04/2019   Influenza, High Dose Seasonal PF 02/04/2015, 01/11/2017, 01/30/2018, 02/05/2020   Influenza-Unspecified 02/16/2022    Moderna SARS-COV2 Booster Vaccination 02/24/2022   Moderna Sars-Covid-2 Vaccination 05/09/2019, 05/27/2019, 03/03/2020, 09/04/2020, 11/04/2020   Pfizer Covid-19 Vaccine Bivalent Booster 64yrs & up 02/09/2021   Pneumococcal Conjugate-13 08/30/2013   Pneumococcal Polysaccharide-23 11/03/2005   RSV,unspecified 04/22/2022   Td 08/01/2001, 09/05/2019   Tdap 09/22/2010, 11/24/2020   Zoster Recombinat (Shingrix) 11/08/2016, 01/11/2017   Zoster, Live 11/04/2005, 11/08/2016, 01/11/2017   Zoster, Unspecified 01/11/2017   Pertinent  Health Maintenance Due  Topic Date Due   INFLUENZA VACCINE  Completed   DEXA SCAN  Completed      10/24/2020   11:31 AM 04/15/2022   10:15 AM 04/27/2022   11:35 AM 05/31/2022    9:34 AM  [place] in the past year?  0 0 0  Was there an injury with Fall?  0 0 0  Fall Risk Category Calculator  0 0 0  Fall Risk Category (Retired)  Low Low   (RETIRED) Patient Fall Risk Level Moderate fall risk Moderate fall risk Moderate fall risk   Patient at Risk for Falls Due to  History of fall(s) History of fall(s) History of fall(s)  Fall risk Follow up  Falls evaluation completed Falls evaluation completed Falls evaluation completed   Functional Status Survey:    Vitals:   05/31/22 0922  BP: 132/78  Pulse: 64  Resp: 18  Temp: (!) 97.3 F (36.3 C)  SpO2: 96%  Weight: (!) 303 lb 2 oz (137.5 kg)  Height: 5\' 8"  (1.727 m)   Body mass index is 46.09 kg/m. Physical Exam Vitals and nursing note reviewed.  Constitutional:      Appearance: She is obese.  HENT:     Head: Normocephalic and atraumatic.     Nose: Nose normal.     Mouth/Throat:     Mouth: Mucous membranes are moist.  Eyes:     Extraocular Movements:  Extraocular movements intact.     Conjunctiva/sclera: Conjunctivae normal.     Pupils: Pupils are equal, round, and reactive to light.  Cardiovascular:     Rate and Rhythm: Normal rate and regular rhythm.     Heart sounds: No murmur  heard. Pulmonary:     Effort: Pulmonary effort is normal.  Abdominal:     General: Bowel sounds are normal.     Palpations: Abdomen is soft.     Tenderness: There is no abdominal tenderness.  Genitourinary:    Comments: No urogenital irritation or skin breakdown Musculoskeletal:     Cervical back: Normal range of motion and neck supple.     Right lower leg: No edema.     Left lower leg: No edema.     Comments: R hip, prosthetic hip removed.   Skin:    General: Skin is warm and dry.     Findings: Rash present.     Comments: Right hip surgical scar. Redness in genital area.   Neurological:     General: No focal deficit present.     Mental Status: She is alert and oriented to person, place, and time.     Gait: Gait normal.  Psychiatric:        Mood and Affect: Mood normal.        Behavior: Behavior normal.        Thought Content: Thought content normal.        Judgment: Judgment normal.     Labs reviewed: Recent Labs    06/30/21 0000 10/19/21 0000 10/28/21 0000  NA 140 139 139  K 4.2 4.5 4.3  CL 104 103 104  CO2 26* 29* 30*  BUN 24* 24* 30*  CREATININE 1.2* 1.0 1.3*  CALCIUM 8.9 9.2 8.9   Recent Labs    06/30/21 0000 10/19/21 0000 10/28/21 0000  AST 15 14 14   ALT 9 12 9   ALKPHOS 81 98 84  ALBUMIN 3.4* 3.8 3.3*   Recent Labs    06/30/21 0000 10/19/21 0000 10/28/21 0000  WBC 5.2 7.9 6.2  NEUTROABS 2,392.00 5,340.00 3,063.00  HGB 12.0 13.5 11.4*  HCT 37 40 35*  PLT 265 281 255   Lab Results  Component Value Date   TSH 4.51 06/30/2021   No results found for: "HGBA1C" No results found for: "CHOL", "HDL", "LDLCALC", "LDLDIRECT", "TRIG", "CHOLHDL"  Significant Diagnostic Results in last 30 days:  No results found.  Assessment/Plan Candidiasis of skin Recurrent, different locations, noted redness genital area, has prn Triamcinolone/Nystatin cream available to her, heat and moist are culprit, assist with the patient with person hygiene frequently.    Blood loss anemia  post op, s/p EGD no active bleed. S/p 6 u PRBC transfusion, ASA was dc'd, On Fe, Hgb 11.4 10/28/21  Benign essential HTN Blood pressure is controlled, only on Furosemide.   Insomnia secondary to depression with anxiety  Insomnia/depression/anxiety, takes Mirtazapine  Gastroesophageal reflux disease stable, on Omeprazole, prn Zofran.   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 F/u Ortho. TDWB. Power 2019 Communication: plan of care reviewed with the patient and charge nurse.   Labs/tests ordered:  none  Time spend 35 minutes.

## 2022-05-31 NOTE — Assessment & Plan Note (Signed)
post op, s/p EGD no active bleed. S/p 6 u PRBC transfusion, ASA was dc'd, On Fe, Hgb 11.4 10/28/21 

## 2022-05-31 NOTE — Assessment & Plan Note (Signed)
takes Levothyroxine, TSH 4.51 06/30/21 

## 2022-05-31 NOTE — Assessment & Plan Note (Signed)
Recurrent, different locations, noted redness genital area, has prn Triamcinolone/Nystatin cream available to her, heat and moist are culprit, assist with the patient with person hygiene frequently.

## 2022-05-31 NOTE — Assessment & Plan Note (Signed)
Insomnia/depression/anxiety, takes Mirtazapine

## 2022-05-31 NOTE — Assessment & Plan Note (Signed)
stable, on Omeprazole, prn Zofran.  

## 2022-05-31 NOTE — Assessment & Plan Note (Signed)
not apparent, takes Furosemide  

## 2022-05-31 NOTE — Assessment & Plan Note (Signed)
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 F/u Ortho. TDWB. Power wc

## 2022-06-21 ENCOUNTER — Telehealth: Payer: Medicare Other | Admitting: Nurse Practitioner

## 2022-06-21 ENCOUNTER — Non-Acute Institutional Stay (SKILLED_NURSING_FACILITY): Payer: Medicare Other | Admitting: Nurse Practitioner

## 2022-06-21 ENCOUNTER — Encounter: Payer: Self-pay | Admitting: Nurse Practitioner

## 2022-06-21 DIAGNOSIS — K2100 Gastro-esophageal reflux disease with esophagitis, without bleeding: Secondary | ICD-10-CM

## 2022-06-21 DIAGNOSIS — F5105 Insomnia due to other mental disorder: Secondary | ICD-10-CM

## 2022-06-21 DIAGNOSIS — Z89621 Acquired absence of right hip joint: Secondary | ICD-10-CM | POA: Diagnosis not present

## 2022-06-21 DIAGNOSIS — M069 Rheumatoid arthritis, unspecified: Secondary | ICD-10-CM | POA: Diagnosis not present

## 2022-06-21 DIAGNOSIS — F418 Other specified anxiety disorders: Secondary | ICD-10-CM

## 2022-06-21 DIAGNOSIS — D500 Iron deficiency anemia secondary to blood loss (chronic): Secondary | ICD-10-CM

## 2022-06-21 DIAGNOSIS — I10 Essential (primary) hypertension: Secondary | ICD-10-CM | POA: Diagnosis not present

## 2022-06-21 NOTE — Assessment & Plan Note (Signed)
stable, on Omeprazole, prn Zofran.

## 2022-06-21 NOTE — Assessment & Plan Note (Signed)
RA f/u Rheumatology, hx of Plaquenil use, stable presently.

## 2022-06-21 NOTE — Progress Notes (Signed)
Location:   [facility] Room Number: 18 Place of Service:  SNF (31) Provider: [facility] Dashonda Bonneau NP  Virgie Dad, MD  Patient Care Team: Virgie Dad, MD as PCP - General (Internal Medicine) Dorothy Spark, MD as PCP - Cardiology (Cardiology) Sueanne Margarita, MD as PCP - Sleep Medicine (Cardiology) Donzetta Sprung., MD (Sports Medicine) Linward Natal, MD (Ophthalmology) [name], Katharine Look, MD (Dermatology) Ronald Lobo, MD (Gastroenterology) Thereasa Distance, MD (Nephrology) Jola Baptist, [place] as Referring Physician (Chiropractic Medicine)  Extended Emergency Contact Information Primary Emergency Contact: [facility] Address: [address]          [place], [place] [address] [name] of [place] Phone: [phone] Mobile Phone: [phone] Relation: Spouse  Code Status:  DNR Goals of care: Advanced Directive information    06/21/2022    2:37 PM  Advanced Directives  Does Patient Have a Medical Advance Directive? Yes  Type of Advance Directive Out of facility DNR (pink MOST or yellow form);Living will  Does patient want to make changes to medical advance directive? No - Patient declined     Chief Complaint  Patient presents with   Medical Management of Chronic Issues    Patient is here for a follow up for chronic conditions     HPI:  Pt is a 82 y.o. female seen today for medical management of chronic diseases.    Elevated AST/ALT normalized, S/p cholecystectomy, [place] 02/19/21 no cyst or mass.               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 F/u Ortho. TDWB. 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 Fully treated, off antibiotics.              Anemia, post op, s/p EGD no active bleed. S/p 6 u PRBC transfusion, ASA was dc'd, On Fe, Hgb 11.4 10/28/21             HTN only on Furosemide.              Morbid obesity             OSA CPAP              Chronic diarrhea, prn Imodium, Align             RA f/u Rheumatology, hx of Plaquenil use, stable presently.              Insomnia/depression/anxiety, takes Mirtazapine             GERD, stable, on Omeprazole, prn Zofran.              Edema, not apparent, takes Furosemide  CKD stage 3 Bun/creat 30/1.3 10/28/21             Hypothyroidism, takes Levothyroxine, TSH 4.51 06/30/21   Past Medical History:  Diagnosis Date   Benign hypertension with chronic kidney disease, stage III ([place])    Overview:  Last Assessment & Plan:  Usually the patient is hypertensive. However today her blood pressure is 123456 systolic. She has been feeling fatigued with some lightheadedness. Part of this may be related to her lower blood pressure. Her pressure may be improved with her decrease in salt and fluid intake. I've instructed her to put her lisinopril/hctz on hold. I've asked her to see her primary  Cardiac disease 03/10/2014   Chronic diarrhea 07/27/2015   Edema 07/27/2015   Ejection fraction 2004   Normal, echo,    Gastroesophageal reflux disease    GERD (gastroesophageal reflux disease)    Heart disease 03/10/2014   Hypertension    Left bundle branch block 09/01/2020   Obesity    Obesity (BMI 30-39.9) 09/25/2017   OSA (obstructive sleep apnea)    mild with AHI 6.75 - on CPAP   Preop cardiovascular exam 11/2010   Cardiac clearance for knee surgery    Sleep apnea 03/10/2014   Supraventricular tachycardia    Documented episode in the past, possibly reentrant tachycardia  Overview:  Overview:  Documented episode in the past, 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 It was possibly reentrant. She does well with diltiazem. No change in therapy.   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   Thyroid disease    Urinary incontinence    Past  Surgical History:  Procedure Laterality Date   CATARACT EXTRACTION Left 8/11   CATARACT EXTRACTION Right 3/12   CHOLECYSTECTOMY  1994   Copsilotomy Laser Treatment Left 6/12   eye   ELBOW SURGERY  8/12   EYE SURGERY Left 09/2008   macular hole   EYE SURGERY Right 12/11   Lumbar Infusion  2011   MOLE REMOVAL  06/17/2015   TOTAL KNEE ARTHROPLASTY Left 6/11   TOTAL KNEE ARTHROPLASTY Right 06/13/2011    Allergies  Allergen Reactions   Keflex [Cephalexin] Diarrhea   Macrobid [Nitrofurantoin] Hives   Amoxicillin Rash    Allergies as of 06/21/2022       Reactions   Keflex [cephalexin] Diarrhea   Macrobid [nitrofurantoin] Hives   Amoxicillin Rash        Medication List        Accurate as of June 21, 2022 11:59 PM. If you have any questions, ask your nurse or doctor.          acetaminophen 325 MG tablet Commonly known as: TYLENOL Take 650 mg by mouth every 6 (six) hours as needed.   albuterol 108 (90 Base) MCG/ACT inhaler Commonly known as: VENTOLIN HFA Inhale 2 puffs into the lungs every 6 (six) hours as needed for wheezing or shortness of breath.   bifidobacterium infantis capsule Take 1 capsule by mouth daily.   CALCIUM PLUS VITAMIN D3 PO Take 1 tablet by mouth daily. Vitamin D3 42mg + Calcium 600   ferrous sulfate 325 (65 FE) MG EC tablet Take 325 mg by mouth. Once A Day on Mon, Thu   furosemide 40 MG tablet Commonly known as: LASIX Take 40 mg by mouth 2 (two) times daily.   gabapentin 300 MG capsule Commonly known as: NEURONTIN Take 300 mg by mouth at bedtime.   levothyroxine 25 MCG tablet Commonly known as: SYNTHROID Take 25 mcg by mouth. one time a day every 2 day(s)   levothyroxine 25 MCG tablet Commonly known as: SYNTHROID Take 50 mcg by mouth. one time a day every 2 day(s)   loperamide 2 MG capsule Commonly known as: IMODIUM Take 2 mg by mouth every 4 (four) hours as needed.   mineral oil-hydrophilic petrolatum ointment Apply 1  Application topically as needed for dry skin. Apply to (L) arm &Chest topically two times a day for Itching;Rash related to ALLERGIC CONTACT DERMATITIS DU   nystatin powder Commonly  known as: MYCOSTATIN/NYSTOP Apply 1 application  topically in the morning, at noon, in the evening, and at bedtime. To genital areas and inner thigh   omeprazole 20 MG capsule Commonly known as: PRILOSEC Take 20 mg by mouth daily.   ondansetron 4 MG disintegrating tablet Commonly known as: ZOFRAN-ODT Take 4 mg by mouth every 8 (eight) hours as needed.   potassium chloride SA 20 MEQ tablet Commonly known as: KLOR-CON M Take 20 mEq by mouth daily.   Vitamin D (Ergocalciferol) 50 MCG (2000 UT) Caps Take 2 capsules by mouth daily.        Review of Systems  Constitutional:  Negative for appetite change, fatigue and fever.  HENT:  Negative for congestion, sore throat and trouble swallowing.   Eyes:  Negative for visual disturbance.  Respiratory:  Negative for cough.   Cardiovascular:  Negative for leg swelling.  Gastrointestinal:  Negative for abdominal pain and constipation.  Genitourinary:  Negative for dysuria, frequency and urgency.  Musculoskeletal:  Positive for arthralgias and gait problem.  Skin:  Negative for color change.  Neurological:  Negative for speech difficulty and weakness.  Psychiatric/Behavioral:  Negative for confusion and sleep disturbance. The patient is not nervous/anxious.     Immunization History  Administered Date(s) Administered   Influenza Split 01/28/2008, 02/17/2009, 02/20/2012, 02/11/2013, 02/02/2016, 01/11/2017, 02/04/2019   Influenza, High Dose Seasonal PF 02/04/2015, 01/11/2017, 01/30/2018, 02/05/2020   Influenza-Unspecified 02/16/2022   Moderna SARS-COV2 Booster Vaccination 02/24/2022   Moderna Sars-Covid-2 Vaccination 05/09/2019, 05/27/2019, 03/03/2020, 09/04/2020, 11/04/2020   Pfizer Covid-19 Vaccine Bivalent Booster 72yr & up 02/09/2021   Pneumococcal  Conjugate-13 08/30/2013   Pneumococcal Polysaccharide-23 11/03/2005   RSV,unspecified 04/22/2022   Td 08/01/2001, 09/05/2019   Tdap 09/22/2010, 11/24/2020   Zoster Recombinat (Shingrix) 11/08/2016, 01/11/2017   Zoster, Live 11/04/2005, 11/08/2016, 01/11/2017   Zoster, Unspecified 01/11/2017   Pertinent  Health Maintenance Due  Topic Date Due   INFLUENZA VACCINE  Completed   DEXA SCAN  Completed      10/24/2020   11:31 AM 04/15/2022   10:15 AM 04/27/2022   11:35 AM 05/31/2022    9:34 AM  FSpencerin the past year?  0 0 0  Was there an injury with Fall?  0 0 0  Fall Risk Category Calculator  0 0 0  Fall Risk Category (Retired)  Low Low   (RETIRED) Patient Fall Risk Level Moderate fall risk Moderate fall risk Moderate fall risk   Patient at Risk for Falls Due to  History of fall(s) History of fall(s) History of fall(s)  Fall risk Follow up  Falls evaluation completed Falls evaluation completed Falls evaluation completed   Functional Status Survey:    Vitals:   06/21/22 1433  BP: (!) 163/73  Pulse: 68  Resp: 18  Temp: (!) 97.3 F (36.3 C)  SpO2: 96%  Weight: (!) 303 lb 3.2 oz (137.5 kg)  Height: '5\' 8"'$  (1.727 m)   Body mass index is 46.1 kg/m. Physical Exam Vitals and nursing note reviewed.  Constitutional:      Appearance: She is obese.  HENT:     Head: Normocephalic and atraumatic.     Nose: Nose normal.     Mouth/Throat:     Mouth: Mucous membranes are moist.  Eyes:     Extraocular Movements: Extraocular movements intact.     Conjunctiva/sclera: Conjunctivae normal.     Pupils: Pupils are equal, round, and reactive to light.  Cardiovascular:     Rate and  Rhythm: Normal rate and regular rhythm.     Heart sounds: No murmur heard. Pulmonary:     Effort: Pulmonary effort is normal.  Abdominal:     General: Bowel sounds are normal.     Palpations: Abdomen is soft.     Tenderness: There is no abdominal tenderness.  Genitourinary:    Comments: No  urogenital irritation or skin breakdown Musculoskeletal:     Cervical back: Normal range of motion and neck supple.     Right lower leg: No edema.     Left lower leg: No edema.     Comments: R hip, prosthetic hip removed.   Skin:    General: Skin is warm and dry.     Comments: Right hip surgical scar.  Neurological:     General: No focal deficit present.     Mental Status: She is alert and oriented to person, place, and time.     Gait: Gait normal.  Psychiatric:        Mood and Affect: Mood normal.        Behavior: Behavior normal.        Thought Content: Thought content normal.        Judgment: Judgment normal.     Labs reviewed: Recent Labs    06/30/21 0000 10/19/21 0000 10/28/21 0000  NA 140 139 139  K 4.2 4.5 4.3  CL 104 103 104  CO2 26* 29* 30*  BUN 24* 24* 30*  CREATININE 1.2* 1.0 1.3*  CALCIUM 8.9 9.2 8.9   Recent Labs    06/30/21 0000 10/19/21 0000 10/28/21 0000  AST '15 14 14  '$ ALT '9 12 9  '$ ALKPHOS 81 98 84  ALBUMIN 3.4* 3.8 3.3*   Recent Labs    06/30/21 0000 10/19/21 0000 10/28/21 0000  WBC 5.2 7.9 6.2  NEUTROABS 2,392.00 5,340.00 3,063.00  HGB 12.0 13.5 11.4*  HCT 37 40 35*  PLT 265 281 255   Lab Results  Component Value Date   TSH 4.51 06/30/2021   No results found for: "HGBA1C" No results found for: "CHOL", "HDL", "LDLCALC", "LDLDIRECT", "TRIG", "CHOLHDL"  Significant Diagnostic Results in last 30 days:  No results found.  Assessment/Plan  Benign essential HTN Blood pressure is controlled, continue Furosemide.   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 F/u Ortho. TDWB. 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 Fully treated, off antibiotics.   Blood loss anemia post op, s/p EGD no active bleed. S/p 6 u PRBC transfusion, ASA was dc'd, On Fe, Hgb 11.4 10/28/21  Rheumatoid arthritis ([place]) RA f/u Rheumatology, hx of Plaquenil use,  stable presently.   Insomnia secondary to depression with anxiety Stable,  takes Mirtazapine  Gastroesophageal reflux disease  stable, on Omeprazole, prn Zofran.   Hypothyroidism  takes Levothyroxine, TSH 4.51 06/30/21   Family/ staff Communication: plan of care reviewed with the patient and charge nurse.   Labs/tests ordered:  none   Time spend 35 minutes.

## 2022-06-21 NOTE — Assessment & Plan Note (Signed)
Stable,  takes Mirtazapine

## 2022-06-21 NOTE — Telephone Encounter (Signed)
Error

## 2022-06-21 NOTE — Assessment & Plan Note (Signed)
takes Levothyroxine, TSH 4.51 06/30/21

## 2022-06-21 NOTE — Assessment & Plan Note (Addendum)
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 F/u Ortho. TDWB. 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 Fully treated, off antibiotics.

## 2022-06-21 NOTE — Assessment & Plan Note (Signed)
Blood pressure is controlled, continue Furosemide.

## 2022-06-21 NOTE — Assessment & Plan Note (Signed)
post op, s/p EGD no active bleed. S/p 6 u PRBC transfusion, ASA was dc'd, On Fe, Hgb 11.4 10/28/21

## 2022-06-24 ENCOUNTER — Encounter: Payer: Self-pay | Admitting: Nurse Practitioner

## 2022-06-27 ENCOUNTER — Non-Acute Institutional Stay (SKILLED_NURSING_FACILITY): Payer: Medicare Other | Admitting: Nurse Practitioner

## 2022-06-27 ENCOUNTER — Encounter: Payer: Self-pay | Admitting: Nurse Practitioner

## 2022-06-27 DIAGNOSIS — F418 Other specified anxiety disorders: Secondary | ICD-10-CM

## 2022-06-27 DIAGNOSIS — R635 Abnormal weight gain: Secondary | ICD-10-CM | POA: Diagnosis not present

## 2022-06-27 DIAGNOSIS — R627 Adult failure to thrive: Secondary | ICD-10-CM | POA: Insufficient documentation

## 2022-06-27 DIAGNOSIS — D500 Iron deficiency anemia secondary to blood loss (chronic): Secondary | ICD-10-CM | POA: Diagnosis not present

## 2022-06-27 DIAGNOSIS — Z89621 Acquired absence of right hip joint: Secondary | ICD-10-CM

## 2022-06-27 DIAGNOSIS — R5381 Other malaise: Secondary | ICD-10-CM

## 2022-06-27 DIAGNOSIS — K2100 Gastro-esophageal reflux disease with esophagitis, without bleeding: Secondary | ICD-10-CM

## 2022-06-27 DIAGNOSIS — I10 Essential (primary) hypertension: Secondary | ICD-10-CM

## 2022-06-27 DIAGNOSIS — F5105 Insomnia due to other mental disorder: Secondary | ICD-10-CM

## 2022-06-27 DIAGNOSIS — R600 Localized edema: Secondary | ICD-10-CM

## 2022-06-27 NOTE — Assessment & Plan Note (Signed)
takes Levothyroxine, TSH 4.51 06/30/21, update TSH in setting of feeling cold, weight gains.

## 2022-06-27 NOTE — Assessment & Plan Note (Signed)
Stable, may consider dc Mirtazapine in setting of weight gain.

## 2022-06-27 NOTE — Assessment & Plan Note (Addendum)
About #20 Ibs in the past 2 months, no apparent fluid retention, will weigh weekly, update CBC/diff, CMP/eGFR, TSH, Hgb A1c, lipid panel, CXR ap/lateral, UA C/S, dc Mirtazapine  c/o feeling cold, bad, gaining weight. Denied SOB, chest pain, dysuria, prn Proventil used for noted wheezes, admitted chills and facial flush 06/26/22. She is afebrile, no O2 desaturation today.   06/27/22 CXR no acute cardiopulmonary abnormality.   06/28/22 LDL 91, Na 136, K 3.7, Bun 21, creat 1.23, wbc 12.9, Hgb 11.6, plt 303, neutrophils 71,TSH 3.48, Hgb A1c 5.1  BMP 46.50, patient desires Ozempic. CBC/diff, CMP/eGFR 2 weeks.   06/28/22 UA mixed genital flora.

## 2022-06-27 NOTE — Assessment & Plan Note (Signed)
only on Furosemide.

## 2022-06-27 NOTE — Assessment & Plan Note (Signed)
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 F/u Ortho. TDWB. 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 Fully treated, off antibiotics.

## 2022-06-27 NOTE — Assessment & Plan Note (Signed)
not apparent, takes Furosemide

## 2022-06-27 NOTE — Assessment & Plan Note (Signed)
Feeling bad, tired, cold, had chills yesterday, afebrile, will update labs, CXR, UA C/S. Observe.

## 2022-06-27 NOTE — Assessment & Plan Note (Signed)
post op, s/p EGD no active bleed. S/p 6 u PRBC transfusion, ASA was dc'd, On Fe, Hgb 11.4 10/28/21

## 2022-06-27 NOTE — Progress Notes (Addendum)
Location:   SNF [place] Room Number: 40 Place of Service:  SNF (31) Provider: [facility] Nizar Cutler NP  Virgie Dad, MD  Patient Care Team: Virgie Dad, MD as PCP - General (Internal Medicine) Dorothy Spark, MD as PCP - Cardiology (Cardiology) Sueanne Margarita, MD as PCP - Sleep Medicine (Cardiology) Donzetta Sprung., MD (Sports Medicine) Linward Natal, MD (Ophthalmology) [name], Katharine Look, MD (Dermatology) Ronald Lobo, MD (Gastroenterology) Thereasa Distance, MD (Nephrology) Jola Baptist, [place] as Referring Physician (Chiropractic Medicine)  Extended Emergency Contact Information Primary Emergency Contact: [facility] Address: [address]          [place], [place] [address] [name] of [place] Phone: [phone] Mobile Phone: [phone] Relation: Spouse  Code Status: DNR Goals of care: Advanced Directive information    06/21/2022    2:37 PM  Advanced Directives  Does Patient Have a Medical Advance Directive? Yes  Type of Advance Directive Out of facility DNR (pink MOST or yellow form);Living will  Does patient want to make changes to medical advance directive? No - Patient declined     Chief Complaint  Patient presents with   Acute Visit    Feeling cold and bad, gained weight.     HPI:  Pt is a 82 y.o. female seen today for an acute visit for c/o feeling cold, bad, gaining weight. Denied SOB, chest pain, dysuria, reported prn Proventil used for noted wheezes, admitted chills and facial flush 06/26/22. She is afebrile, no O2 desaturation today.  Elevated AST/ALT normalized, S/p cholecystectomy, [place] 02/19/21 no cyst or mass.               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 F/u Ortho. TDWB. 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 Fully treated, off antibiotics.              Anemia, post op, s/p EGD no active bleed. S/p 6 u PRBC  transfusion, ASA was dc'd, On Fe, Hgb 11.4 10/28/21             HTN only on Furosemide.              Morbid obesity             OSA CPAP             Chronic diarrhea, prn Imodium, Align             RA f/u Rheumatology, hx of Plaquenil use, stable presently.              Insomnia/depression/anxiety, takes Mirtazapine             GERD, stable, on Omeprazole, prn Zofran.              Edema, not apparent, takes Furosemide  CKD stage 3 Bun/creat 30/1.3 10/28/21             Hypothyroidism, takes Levothyroxine, TSH 4.51 06/30/21   Past Medical History:  Diagnosis Date   Benign hypertension with chronic kidney disease, stage III ([place])    Overview:  Last Assessment & Plan:  Usually the patient is hypertensive. However today her blood pressure is 123456 systolic. She has been feeling fatigued with some lightheadedness. Part of this may be related to her lower blood pressure. Her pressure may be improved with her decrease in salt and  fluid intake. I've instructed her to put her lisinopril/hctz on hold. 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 It was possibly reentrant. She does well with diltiazem. No change in therapy.   SVT  (supraventricular tachycardia)    Documented episode in the past, possibly reentrant tachycardia   Thyroid disease    Urinary incontinence    Past Surgical History:  Procedure Laterality Date   CATARACT EXTRACTION Left 8/11   CATARACT EXTRACTION Right 3/12   CHOLECYSTECTOMY  1994   Copsilotomy Laser Treatment Left 6/12   eye   ELBOW SURGERY  8/12   EYE SURGERY Left 09/2008   macular hole   EYE SURGERY Right 12/11   Lumbar Infusion  2011   MOLE REMOVAL  06/17/2015   TOTAL KNEE ARTHROPLASTY Left 6/11   TOTAL KNEE ARTHROPLASTY Right 06/13/2011    Allergies  Allergen Reactions   Keflex [Cephalexin] Diarrhea   Macrobid [Nitrofurantoin] Hives   Amoxicillin Rash    Allergies as of 06/27/2022       Reactions   Keflex [cephalexin] Diarrhea   Macrobid [nitrofurantoin] Hives   Amoxicillin Rash        Medication List        Accurate as of June 27, 2022 11:59 PM. If you have any questions, ask your nurse or doctor.          acetaminophen 325 MG tablet Commonly known as: TYLENOL Take 650 mg by mouth every 6 (six) hours as needed.   albuterol 108 (90 Base) MCG/ACT inhaler Commonly known as: VENTOLIN HFA Inhale 2 puffs into the lungs every 6 (six) hours as needed for wheezing or shortness of breath.   bifidobacterium infantis capsule Take 1 capsule by mouth daily.   CALCIUM PLUS VITAMIN D3 PO Take 1 tablet by mouth daily. Vitamin D3 26mg + Calcium 600   ferrous sulfate 325 (65 FE) MG EC tablet Take 325 mg by mouth. Once A Day on Mon, Thu   furosemide 40 MG tablet Commonly known as: LASIX Take 40 mg by mouth 2 (two) times daily.   gabapentin 300 MG capsule Commonly known as: NEURONTIN Take 300 mg by mouth at bedtime.   levothyroxine 25 MCG tablet Commonly known as: SYNTHROID Take 25 mcg by mouth. one time a day every 2 day(s)   levothyroxine 25 MCG tablet Commonly known as: SYNTHROID Take 50 mcg by mouth. one time a day every 2 day(s)   loperamide 2 MG  capsule Commonly known as: IMODIUM Take 2 mg by mouth every 4 (four) hours as needed.   mineral oil-hydrophilic petrolatum ointment Apply 1 Application topically as needed for dry skin. Apply to (  L) arm &Chest topically two times a day for Itching;Rash related to ALLERGIC CONTACT DERMATITIS DU   nystatin powder Commonly known as: MYCOSTATIN/NYSTOP Apply 1 application  topically in the morning, at noon, in the evening, and at bedtime. To genital areas and inner thigh   omeprazole 20 MG capsule Commonly known as: PRILOSEC Take 20 mg by mouth daily.   ondansetron 4 MG disintegrating tablet Commonly known as: ZOFRAN-ODT Take 4 mg by mouth every 8 (eight) hours as needed.   potassium chloride SA 20 MEQ tablet Commonly known as: KLOR-CON M Take 20 mEq by mouth daily.   Vitamin D (Ergocalciferol) 50 MCG (2000 UT) Caps Take 2 capsules by mouth daily.        Review of Systems  Constitutional:  Positive for chills and unexpected weight change. Negative for appetite change, fatigue and fever.  HENT:  Negative for congestion, sore throat and trouble swallowing.   Eyes:  Negative for visual disturbance.  Respiratory:  Negative for cough.   Cardiovascular:  Negative for leg swelling.  Gastrointestinal:  Negative for abdominal pain and constipation.  Endocrine: Positive for cold intolerance.  Genitourinary:  Negative for dysuria, frequency and urgency.  Musculoskeletal:  Positive for arthralgias and gait problem.  Skin:  Negative for color change.  Neurological:  Negative for speech difficulty and weakness.  Psychiatric/Behavioral:  Negative for confusion and sleep disturbance. The patient is not nervous/anxious.     Immunization History  Administered Date(s) Administered   Influenza Split 01/28/2008, 02/17/2009, 02/20/2012, 02/11/2013, 02/02/2016, 01/11/2017, 02/04/2019   Influenza, High Dose Seasonal PF 02/04/2015, 01/11/2017, 01/30/2018, 02/05/2020   Influenza-Unspecified  02/16/2022   Moderna SARS-COV2 Booster Vaccination 02/24/2022   Moderna Sars-Covid-2 Vaccination 05/09/2019, 05/27/2019, 03/03/2020, 09/04/2020, 11/04/2020   Pfizer Covid-19 Vaccine Bivalent Booster 75yr & up 02/09/2021   Pneumococcal Conjugate-13 08/30/2013   Pneumococcal Polysaccharide-23 11/03/2005   RSV,unspecified 04/22/2022   Td 08/01/2001, 09/05/2019   Tdap 09/22/2010, 11/24/2020   Zoster Recombinat (Shingrix) 11/08/2016, 01/11/2017   Zoster, Live 11/04/2005, 11/08/2016, 01/11/2017   Zoster, Unspecified 01/11/2017   Pertinent  Health Maintenance Due  Topic Date Due   INFLUENZA VACCINE  Completed   DEXA SCAN  Completed      10/24/2020   11:31 AM 04/15/2022   10:15 AM 04/27/2022   11:35 AM 05/31/2022    9:34 AM  Fall Risk  Falls in the past year?  0 0 0  Was there an injury with Fall?  0 0 0  Fall Risk Category Calculator  0 0 0  Fall Risk Category (Retired)  Low Low   (RETIRED) Patient Fall Risk Level Moderate fall risk Moderate fall risk Moderate fall risk   Patient at Risk for Falls Due to  History of fall(s) History of fall(s) History of fall(s)  Fall risk Follow up  Falls evaluation completed Falls evaluation completed Falls evaluation completed   Functional Status Survey:    Vitals:   06/27/22 1402  BP: 138/78  Pulse: 93  Temp: 97.7 F (36.5 C)  Weight: (!) 305 lb 12.8 oz (138.7 kg)   Body mass index is 46.5 kg/m. Physical Exam Vitals and nursing note reviewed.  Constitutional:      Appearance: She is obese.     Comments: Feeling bad  HENT:     Head: Normocephalic and atraumatic.     Nose: Nose normal.     Mouth/Throat:     Mouth: Mucous membranes are moist.  Eyes:     Extraocular Movements: Extraocular movements intact.  Conjunctiva/sclera: Conjunctivae normal.     Pupils: Pupils are equal, round, and reactive to light.  Cardiovascular:     Rate and Rhythm: Normal rate and regular rhythm.     Heart sounds: No murmur heard. Pulmonary:      Effort: Pulmonary effort is normal.     Breath sounds: No wheezing, rhonchi or rales.  Abdominal:     General: Bowel sounds are normal.     Palpations: Abdomen is soft.     Tenderness: There is no abdominal tenderness.  Genitourinary:    Comments: No urogenital irritation or skin breakdown Musculoskeletal:     Cervical back: Normal range of motion and neck supple.     Right lower leg: No edema.     Left lower leg: No edema.     Comments: R hip, prosthetic hip removed.   Skin:    General: Skin is warm and dry.     Comments: Right hip surgical scar.  Neurological:     General: No focal deficit present.     Mental Status: She is alert and oriented to person, place, and time.     Gait: Gait normal.  Psychiatric:        Mood and Affect: Mood normal.        Behavior: Behavior normal.        Thought Content: Thought content normal.        Judgment: Judgment normal.     Labs reviewed: Recent Labs    10/19/21 0000 10/28/21 0000  NA 139 139  K 4.5 4.3  CL 103 104  CO2 29* 30*  BUN 24* 30*  CREATININE 1.0 1.3*  CALCIUM 9.2 8.9   Recent Labs    10/19/21 0000 10/28/21 0000  AST 14 14  ALT 12 9  ALKPHOS 98 84  ALBUMIN 3.8 3.3*   Recent Labs    10/19/21 0000 10/28/21 0000  WBC 7.9 6.2  NEUTROABS 5,340.00 3,063.00  HGB 13.5 11.4*  HCT 40 35*  PLT 281 255   Lab Results  Component Value Date   TSH 4.51 06/30/2021   No results found for: "HGBA1C" No results found for: "CHOL", "HDL", "LDLCALC", "LDLDIRECT", "TRIG", "CHOLHDL"  Significant Diagnostic Results in last 30 days:  No results found.  Assessment/Plan: Weight gain About #20 Ibs in the past 2 months, no apparent fluid retention, will weigh weekly, update CBC/diff, CMP/eGFR, TSH, Hgb A1c, lipid panel, CXR ap/lateral, UA C/S, dc Mirtazapine  c/o feeling cold, bad, gaining weight. Denied SOB, chest pain, dysuria, prn Proventil used for noted wheezes, admitted chills and facial flush 06/26/22. She is afebrile, no  O2 desaturation today.   06/27/22 CXR no acute cardiopulmonary abnormality.   06/28/22 LDL 91, Na 136, K 3.7, Bun 21, creat 1.23, wbc 12.9, Hgb 11.6, plt 303, neutrophils 71,TSH 3.48, Hgb A1c 5.1  BMP 46.50, patient desires Ozempic. CBC/diff, CMP/eGFR 2 weeks.   06/28/22 UA mixed genital flora.     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 F/u Ortho. TDWB. 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 Fully treated, off antibiotics.   Blood loss anemia post op, s/p EGD no active bleed. S/p 6 u PRBC transfusion, ASA was dc'd, On Fe, Hgb 11.4 10/28/21  Benign essential HTN only on Furosemide.   Insomnia secondary to depression with anxiety Stable, may consider  dc Mirtazapine in setting of weight gain.   Gastroesophageal reflux disease stable, on Omeprazole, prn Zofran.   Edema of lower extremity not apparent, takes Furosemide   Hypothyroidism  takes Levothyroxine, TSH 4.51 06/30/21, update TSH in setting of feeling cold, weight gains.   Malaise Feeling bad, tired, cold, had chills yesterday, afebrile, will update labs, CXR, UA C/S. Observe.     Family/ staff Communication: plan of care reviewed with the patient, the patient's husband,  and charge nurse.   Labs/tests ordered: CBC/diff, CMP/eGFR, TSH, Hgb A1c, lipid panel, UA C/S  Time spend 35 minutes.

## 2022-06-27 NOTE — Assessment & Plan Note (Signed)
stable, on Omeprazole, prn Zofran.

## 2022-07-02 ENCOUNTER — Emergency Department (HOSPITAL_COMMUNITY): Payer: Medicare Other

## 2022-07-02 ENCOUNTER — Inpatient Hospital Stay (HOSPITAL_COMMUNITY)
Admission: EM | Admit: 2022-07-02 | Discharge: 2022-07-07 | DRG: 871 | Disposition: A | Discharge status: Nursing Home (Includes ICF) | Payer: Medicare Other | Source: Skilled Nursing Facility | Attending: Internal Medicine | Admitting: Internal Medicine

## 2022-07-02 ENCOUNTER — Other Ambulatory Visit: Payer: Self-pay

## 2022-07-02 DIAGNOSIS — E876 Hypokalemia: Secondary | ICD-10-CM | POA: Diagnosis present

## 2022-07-02 DIAGNOSIS — Z1152 Encounter for screening for COVID-19: Secondary | ICD-10-CM

## 2022-07-02 DIAGNOSIS — F32A Depression, unspecified: Secondary | ICD-10-CM | POA: Diagnosis present

## 2022-07-02 DIAGNOSIS — R5381 Other malaise: Secondary | ICD-10-CM | POA: Diagnosis present

## 2022-07-02 DIAGNOSIS — Z8249 Family history of ischemic heart disease and other diseases of the circulatory system: Secondary | ICD-10-CM

## 2022-07-02 DIAGNOSIS — M25551 Pain in right hip: Secondary | ICD-10-CM | POA: Diagnosis present

## 2022-07-02 DIAGNOSIS — I2510 Atherosclerotic heart disease of native coronary artery without angina pectoris: Secondary | ICD-10-CM | POA: Diagnosis present

## 2022-07-02 DIAGNOSIS — Z880 Allergy status to penicillin: Secondary | ICD-10-CM

## 2022-07-02 DIAGNOSIS — I3139 Other pericardial effusion (noninflammatory): Secondary | ICD-10-CM | POA: Diagnosis present

## 2022-07-02 DIAGNOSIS — R0789 Other chest pain: Secondary | ICD-10-CM | POA: Diagnosis present

## 2022-07-02 DIAGNOSIS — Z9841 Cataract extraction status, right eye: Secondary | ICD-10-CM

## 2022-07-02 DIAGNOSIS — N1831 Chronic kidney disease, stage 3a: Secondary | ICD-10-CM | POA: Diagnosis present

## 2022-07-02 DIAGNOSIS — E871 Hypo-osmolality and hyponatremia: Secondary | ICD-10-CM

## 2022-07-02 DIAGNOSIS — B965 Pseudomonas (aeruginosa) (mallei) (pseudomallei) as the cause of diseases classified elsewhere: Secondary | ICD-10-CM | POA: Diagnosis present

## 2022-07-02 DIAGNOSIS — J9811 Atelectasis: Secondary | ICD-10-CM | POA: Diagnosis present

## 2022-07-02 DIAGNOSIS — N179 Acute kidney failure, unspecified: Secondary | ICD-10-CM | POA: Diagnosis present

## 2022-07-02 DIAGNOSIS — D631 Anemia in chronic kidney disease: Secondary | ICD-10-CM | POA: Diagnosis present

## 2022-07-02 DIAGNOSIS — J9601 Acute respiratory failure with hypoxia: Secondary | ICD-10-CM | POA: Diagnosis present

## 2022-07-02 DIAGNOSIS — Z881 Allergy status to other antibiotic agents status: Secondary | ICD-10-CM

## 2022-07-02 DIAGNOSIS — Z713 Dietary counseling and surveillance: Secondary | ICD-10-CM

## 2022-07-02 DIAGNOSIS — Z6841 Body Mass Index (BMI) 40.0 and over, adult: Secondary | ICD-10-CM

## 2022-07-02 DIAGNOSIS — A419 Sepsis, unspecified organism: Principal | ICD-10-CM | POA: Diagnosis present

## 2022-07-02 DIAGNOSIS — Z8042 Family history of malignant neoplasm of prostate: Secondary | ICD-10-CM

## 2022-07-02 DIAGNOSIS — Z79890 Hormone replacement therapy (postmenopausal): Secondary | ICD-10-CM

## 2022-07-02 DIAGNOSIS — Z9842 Cataract extraction status, left eye: Secondary | ICD-10-CM

## 2022-07-02 DIAGNOSIS — I130 Hypertensive heart and chronic kidney disease with heart failure and stage 1 through stage 4 chronic kidney disease, or unspecified chronic kidney disease: Secondary | ICD-10-CM | POA: Diagnosis present

## 2022-07-02 DIAGNOSIS — Z79899 Other long term (current) drug therapy: Secondary | ICD-10-CM

## 2022-07-02 DIAGNOSIS — Z87891 Personal history of nicotine dependence: Secondary | ICD-10-CM

## 2022-07-02 DIAGNOSIS — Z96653 Presence of artificial knee joint, bilateral: Secondary | ICD-10-CM | POA: Diagnosis present

## 2022-07-02 DIAGNOSIS — E872 Acidosis, unspecified: Secondary | ICD-10-CM | POA: Diagnosis present

## 2022-07-02 DIAGNOSIS — Z993 Dependence on wheelchair: Secondary | ICD-10-CM

## 2022-07-02 DIAGNOSIS — L03115 Cellulitis of right lower limb: Secondary | ICD-10-CM | POA: Diagnosis present

## 2022-07-02 DIAGNOSIS — N184 Chronic kidney disease, stage 4 (severe): Secondary | ICD-10-CM | POA: Diagnosis present

## 2022-07-02 DIAGNOSIS — E039 Hypothyroidism, unspecified: Secondary | ICD-10-CM | POA: Diagnosis present

## 2022-07-02 DIAGNOSIS — R6510 Systemic inflammatory response syndrome (SIRS) of non-infectious origin without acute organ dysfunction: Principal | ICD-10-CM

## 2022-07-02 DIAGNOSIS — Z833 Family history of diabetes mellitus: Secondary | ICD-10-CM

## 2022-07-02 DIAGNOSIS — Z9049 Acquired absence of other specified parts of digestive tract: Secondary | ICD-10-CM

## 2022-07-02 DIAGNOSIS — E8809 Other disorders of plasma-protein metabolism, not elsewhere classified: Secondary | ICD-10-CM | POA: Diagnosis present

## 2022-07-02 DIAGNOSIS — Z96641 Presence of right artificial hip joint: Secondary | ICD-10-CM | POA: Diagnosis present

## 2022-07-02 DIAGNOSIS — G4733 Obstructive sleep apnea (adult) (pediatric): Secondary | ICD-10-CM | POA: Diagnosis present

## 2022-07-02 DIAGNOSIS — K219 Gastro-esophageal reflux disease without esophagitis: Secondary | ICD-10-CM | POA: Diagnosis present

## 2022-07-02 DIAGNOSIS — I700 Atherosclerosis of aorta: Secondary | ICD-10-CM | POA: Diagnosis present

## 2022-07-02 DIAGNOSIS — I5032 Chronic diastolic (congestive) heart failure: Secondary | ICD-10-CM | POA: Diagnosis present

## 2022-07-02 DIAGNOSIS — K449 Diaphragmatic hernia without obstruction or gangrene: Secondary | ICD-10-CM | POA: Diagnosis present

## 2022-07-02 LAB — RESP PANEL BY RT-PCR (RSV, FLU A&B, COVID)  RVPGX2
Influenza A by PCR: NEGATIVE
Influenza B by PCR: NEGATIVE
Resp Syncytial Virus by PCR: NEGATIVE
SARS Coronavirus 2 by RT PCR: NEGATIVE

## 2022-07-02 LAB — CBC WITH DIFFERENTIAL/PLATELET
Abs Immature Granulocytes: 0.17 10*3/uL — ABNORMAL HIGH (ref 0.00–0.07)
Basophils Absolute: 0.1 10*3/uL (ref 0.0–0.1)
Basophils Relative: 0 %
Eosinophils Absolute: 0 10*3/uL (ref 0.0–0.5)
Eosinophils Relative: 0 %
HCT: 41.1 % (ref 36.0–46.0)
Hemoglobin: 12.7 g/dL (ref 12.0–15.0)
Immature Granulocytes: 1 %
Lymphocytes Relative: 3 %
Lymphs Abs: 0.6 10*3/uL — ABNORMAL LOW (ref 0.7–4.0)
MCH: 27.4 pg (ref 26.0–34.0)
MCHC: 30.9 g/dL (ref 30.0–36.0)
MCV: 88.8 fL (ref 80.0–100.0)
Monocytes Absolute: 1.3 10*3/uL — ABNORMAL HIGH (ref 0.1–1.0)
Monocytes Relative: 7 %
Neutro Abs: 17.5 10*3/uL — ABNORMAL HIGH (ref 1.7–7.7)
Neutrophils Relative %: 89 %
Platelets: 455 10*3/uL — ABNORMAL HIGH (ref 150–400)
RBC: 4.63 MIL/uL (ref 3.87–5.11)
RDW: 13.5 % (ref 11.5–15.5)
WBC: 19.7 10*3/uL — ABNORMAL HIGH (ref 4.0–10.5)
nRBC: 0 % (ref 0.0–0.2)

## 2022-07-02 LAB — COMPREHENSIVE METABOLIC PANEL
ALT: 23 U/L (ref 0–44)
AST: 31 U/L (ref 15–41)
Albumin: 2.6 g/dL — ABNORMAL LOW (ref 3.5–5.0)
Alkaline Phosphatase: 92 U/L (ref 38–126)
Anion gap: 10 (ref 5–15)
BUN: 22 mg/dL (ref 8–23)
CO2: 23 mmol/L (ref 22–32)
Calcium: 8.5 mg/dL — ABNORMAL LOW (ref 8.9–10.3)
Chloride: 101 mmol/L (ref 98–111)
Creatinine, Ser: 1.32 mg/dL — ABNORMAL HIGH (ref 0.44–1.00)
GFR, Estimated: 41 mL/min — ABNORMAL LOW (ref >60)
Glucose, Bld: 130 mg/dL — ABNORMAL HIGH (ref 70–99)
Potassium: 3.6 mmol/L (ref 3.5–5.1)
Sodium: 134 mmol/L — ABNORMAL LOW (ref 135–145)
Total Bilirubin: 0.6 mg/dL (ref 0.3–1.2)
Total Protein: 7.2 g/dL (ref 6.5–8.1)

## 2022-07-02 LAB — TROPONIN I (HIGH SENSITIVITY): Troponin I (High Sensitivity): 7 ng/L (ref <18)

## 2022-07-02 LAB — BRAIN NATRIURETIC PEPTIDE: B Natriuretic Peptide: 142 pg/mL — ABNORMAL HIGH (ref 0.0–100.0)

## 2022-07-02 MED ORDER — FENTANYL CITRATE PF 50 MCG/ML IJ SOSY
50.0000 ug | PREFILLED_SYRINGE | Freq: Once | INTRAMUSCULAR | Status: AC
  Administered 2022-07-02: 50 ug via INTRAVENOUS
  Filled 2022-07-02: qty 1

## 2022-07-02 MED ORDER — SODIUM CHLORIDE 0.9 % IV SOLN
2.0000 g | Freq: Once | INTRAVENOUS | Status: AC
  Administered 2022-07-02: 2 g via INTRAVENOUS
  Filled 2022-07-02: qty 20

## 2022-07-02 MED ORDER — SODIUM CHLORIDE (PF) 0.9 % IJ SOLN
INTRAMUSCULAR | Status: AC
  Filled 2022-07-02: qty 50

## 2022-07-02 MED ORDER — IOHEXOL 350 MG/ML SOLN
75.0000 mL | Freq: Once | INTRAVENOUS | Status: AC | PRN
  Administered 2022-07-02: 75 mL via INTRAVENOUS

## 2022-07-02 NOTE — ED Provider Notes (Signed)
Care assumed from Dr. Melina Copa, patient with chest pain and hip pain. Chest pain work-up is negative, but is being evaluated for possible septic hip versus cellulitis. CT of hip is pending, will need to be admitted. Will need orthopedic consult.  CT scan does show a fluid collection which could represent an abscess, also stranding suggestive of cellulitis.  I have independently viewed the images, and agree with the radiologist's interpretation.  She has been started on antibiotics.  I discussed the case with Dr. Velia Meyer of [facility], who agrees to admit the patient.  I have also discussed the case with Dr. Maureen Ralphs of orthopedic service.  He is not sure that patient is actually a patient of his office, but states that he will see the patient and, if she is unassigned, we will arrange transfer to the on-call orthopedic surgeon.  Results for orders placed or performed during the hospital encounter of 07/02/22  Resp panel by RT-PCR (RSV, Flu A&B, Covid) Anterior Nasal Swab   Specimen: Anterior Nasal Swab  Result Value Ref Range   SARS Coronavirus 2 by RT PCR NEGATIVE NEGATIVE   Influenza A by PCR NEGATIVE NEGATIVE   Influenza B by PCR NEGATIVE NEGATIVE   Resp Syncytial Virus by PCR NEGATIVE NEGATIVE  Culture, blood (routine x 2)   Specimen: BLOOD  Result Value Ref Range   Specimen Description      BLOOD SITE NOT SPECIFIED Performed at [facility], [address] [place], [place] [address]    Special Requests      BOTTLES DRAWN AEROBIC AND ANAEROBIC Blood Culture results may not be optimal due to an inadequate volume of blood received in culture bottles Performed at [facility], [place] [address], [place], [place] [address]    Culture PENDING    Report Status PENDING   Comprehensive metabolic panel  Result Value Ref Range   Sodium 134 (L) 135 - 145 mmol/L   Potassium 3.6 3.5 - 5.1 mmol/L   Chloride 101 98 - 111 mmol/L   CO2 23 22 - 32 mmol/L   Glucose, Bld 130  (H) 70 - 99 mg/dL   BUN 22 8 - 23 mg/dL   Creatinine, Ser 1.32 (H) 0.44 - 1.00 mg/dL   Calcium 8.5 (L) 8.9 - 10.3 mg/dL   Total Protein 7.2 6.5 - 8.1 g/dL   Albumin 2.6 (L) 3.5 - 5.0 g/dL   AST 31 15 - 41 U/L   ALT 23 0 - 44 U/L   Alkaline Phosphatase 92 38 - 126 U/L   Total Bilirubin 0.6 0.3 - 1.2 mg/dL   GFR, Estimated 41 (L) >60 mL/min   Anion gap 10 5 - 15  CBC with Differential  Result Value Ref Range   WBC 19.7 (H) 4.0 - 10.5 K/uL   RBC 4.63 3.87 - 5.11 MIL/uL   Hemoglobin 12.7 12.0 - 15.0 g/dL   HCT 41.1 36.0 - 46.0 %   MCV 88.8 80.0 - 100.0 fL   MCH 27.4 26.0 - 34.0 pg   MCHC 30.9 30.0 - 36.0 g/dL   RDW 13.5 11.5 - 15.5 %   Platelets 455 (H) 150 - 400 K/uL   nRBC 0.0 0.0 - 0.2 %   Neutrophils Relative % 89 %   Neutro Abs 17.5 (H) 1.7 - 7.7 K/uL   Lymphocytes Relative 3 %   Lymphs Abs 0.6 (L) 0.7 - 4.0 K/uL   Monocytes Relative 7 %   Monocytes Absolute 1.3 (H) 0.1 -  1.0 K/uL   Eosinophils Relative 0 %   Eosinophils Absolute 0.0 0.0 - 0.5 K/uL   Basophils Relative 0 %   Basophils Absolute 0.1 0.0 - 0.1 K/uL   Immature Granulocytes 1 %   Abs Immature Granulocytes 0.17 (H) 0.00 - 0.07 K/uL  Brain natriuretic peptide  Result Value Ref Range   B Natriuretic Peptide 142.0 (H) 0.0 - 100.0 pg/mL  Lactic acid, plasma  Result Value Ref Range   Lactic Acid, Venous 1.6 0.5 - 1.9 mmol/L  Sedimentation rate  Result Value Ref Range   Sed Rate 85 (H) 0 - 22 mm/hr  C-reactive protein  Result Value Ref Range   CRP 27.2 (H) <1.0 mg/dL  Troponin I (High Sensitivity)  Result Value Ref Range   Troponin I (High Sensitivity) 7 <18 ng/L  Troponin I (High Sensitivity)  Result Value Ref Range   Troponin I (High Sensitivity) 8 <18 ng/L   CT Hip Right Wo Contrast  Result Date: 07/02/2022 CLINICAL DATA:  Septic arthritis suspected. Hip x-ray done. Hip pain and redness in the right hip. EXAM: CT OF THE RIGHT HIP WITHOUT CONTRAST TECHNIQUE: Multidetector CT imaging of the right hip was  performed according to the standard protocol. Multiplanar CT image reconstructions were also generated. RADIATION DOSE REDUCTION: This exam was performed according to the departmental dose-optimization program which includes automated exposure control, adjustment of the mA and/or kV according to patient size and/or use of iterative reconstruction technique. 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 Increased callus formation about the acetabulum with redemonstrated fragmentation along the medial aspect of the acetabulum. Unchanged fragmentation of the right greater trochanter. MR nondisplaced longitudinal fracture and osseous fragmentation about the proximal femoral diaphysis. The proximal end of the femoral diaphysis has shifted anteriorly compared to 06/08/2021. Unchanged mixed soft tissue and fluid components at the site of the prior femoral head and neck component compatible with granulation tissue. There may be a central fluid component/seroma though this is similar to decreased from 06/08/2021. Ligaments Suboptimally assessed by CT. Muscles and Tendons Muscle atrophy of the gluteal muscles. Soft tissues Similar postsurgical scarring in the subcutaneous fat anterior to the right hip. Increased size of the thick-walled fluid collection in the lateral right subcutaneous fat extending medially to the greater trochanter fragmentation. This is not completely visualized on this exam and extends laterally beyond the lateral aspect of the scan. Suggestion of adjacent stranding lateral to the fluid collection though this is largely excluded from the field-of-view. IMPRESSION: 1. 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 There is some interval healing change since 06/08/2021. 2. Unchanged granulation tissue and possible central seroma about  the proximal end of the femoral diaphysis and acetabulum. 3. Increased size of the thick-walled fluid collection in the lateral right subcutaneous fat extending medially to the greater trochanter fragmentation. This is not completely visualized on this exam and extends laterally beyond the field-of-view. There is the suggestion of subcutaneous stranding lateral to the fluid collection. Clinical correlation is recommended to exclude cellulitis. If there is concern for abscess, consider further evaluation with ultrasound and/or fluid sampling. It is unclear if the large subcutaneous fluid collection communicates with the joint space. Electronically Signed   By: Placido Sou M.D.   On: 07/02/2022 23:43   DG Hip Unilat With Pelvis 2-3 Views Right  Result Date: 07/02/2022 CLINICAL DATA:  Hip pain with redness and  swelling to the right hip EXAM: DG HIP (WITH OR WITHOUT PELVIS) 2-3V RIGHT COMPARISON:  CT of the right hip 06/08/2021 FINDINGS: Findings appear similar to CT 06/08/2021 given differences in technique. Changes from prior right hip arthroplasty with subsequent hardware removal. Fragmentation of the right greater trochanter. Cortical thickening and sclerosis about the proximal femoral shaft and acetabulum. No definite acute fracture. The soft tissue thickening and fluid about the right hip was better demonstrated on CT. Contrast within the bladder IMPRESSION: No definite acute fracture. Similar changes about the right hip from right hip arthroplasty and subsequent heart were removal when compared with CT 06/08/2021. Electronically Signed   By: Placido Sou M.D.   On: 07/02/2022 22:01   CT Angio Chest PE W/Cm &/Or Wo Cm  Result Date: 07/02/2022 CLINICAL DATA:  Pulmonary embolism suspected.  High probability. EXAM: CT ANGIOGRAPHY CHEST WITH CONTRAST TECHNIQUE: Multidetector CT imaging of the chest was performed using the standard protocol during bolus administration of intravenous contrast. Multiplanar  CT image reconstructions and MIPs were obtained to evaluate the vascular anatomy. RADIATION DOSE REDUCTION: This exam was performed according to the departmental dose-optimization program which includes automated exposure control, adjustment of the mA and/or kV according to patient size and/or use of iterative reconstruction technique. CONTRAST:  10m OMNIPAQUE IOHEXOL 350 MG/ML SOLN COMPARISON:  Portable chest today, portable chest 10/24/2020. FINDINGS: Cardiovascular: There is mild cardiomegaly and small pericardial effusion. There are scattered three-vessel coronary artery calcifications. The pulmonary veins are normal caliber. The pulmonary arteries are normal caliber and clear through the segmental divisions. Due to breathing motion, the subsegmental arterial bed is obscured and not evaluated. There is mild aortic atherosclerosis and tortuosity without aneurysm, stenosis or dissection. The great vessels are clear. Mediastinum/Nodes: There is a moderate-sized hiatal hernia with gastroesophageal reflux to the level of the aortic arch. The esophageal thickness is normal. The trachea and main bronchi are patent. There are minimal retained secretions in the right main bronchus. There is no intrathoracic or axillary adenopathy. Axillary spaces are clear. Normal thyroid. Lungs/Pleura: There small symmetric bilateral layering pleural effusions and adjacent coarse atelectatic markings in the bilateral lower lobes. There is mild posterior atelectasis in the upper lung fields. Reticulated scarring noted both apices. No focal infiltrate or nodule is seen through the breathing motion. There is no pulmonary edema. Upper Abdomen: Status post cholecystectomy. No biliary dilatation. Moderate hepatic steatosis. No acute upper abdominal findings. Musculoskeletal: There is thoracic kyphosis, osteopenia, spondylosis and multilevel degenerative discs, multilevel bridging enthesopathy. Bilateral acromiohumeral abutment consistent  with chronic rotator cuff arthropathy and degenerative tears is also noted with bilateral supraspinatus fatty atrophy. No acute or other significant musculoskeletal findings. Review of the MIP images confirms the above findings. IMPRESSION: 1. No evidence of arterial dilatation through the segmental divisions. The subsegmental arterial bed is obscured by breathing motion. 2. Cardiomegaly with small pericardial effusion and three-vessel coronary atherosclerosis. 3. Small bilateral pleural effusions with adjacent atelectasis in the lower lobes. 4. Moderate-sized hiatal hernia with gastroesophageal reflux to the level of the aortic arch. Aspiration precautions may be indicated. 5. Aortic atherosclerosis. 6. Moderate hepatic steatosis. 7. Osteopenia, kyphosis and degenerative change. 8. 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 Aortic Atherosclerosis (ICD10-I70.0). Electronically Signed   By: KTelford NabM.D.   On: 07/02/2022 21:32   DG Chest Port 1 View  Result Date: 07/02/2022 CLINICAL DATA:  Sternal chest pain, worse with movement EXAM: PORTABLE CHEST 1 VIEW COMPARISON:  Chest radiograph  dated 10/24/2020 FINDINGS: Low lung volumes with bronchovascular crowding. Bibasilar and bilateral perihilar patchy opacities. 0w0 No pleural effusion or pneumothorax. The heart size and mediastinal contours are within normal limits. The visualized skeletal structures are unremarkable. IMPRESSION: 1. Low lung volumes with bronchovascular crowding. 2. Bibasilar and bilateral perihilar patchy opacities, which could be due to atelectasis or infection. Electronically Signed   By: Darrin Nipper M.D.   On: [id number]      Delora Fuel, [place] 123456 [phone]

## 2022-07-02 NOTE — ED Triage Notes (Signed)
Pt arrived via EMS from [facility] for sternum pain. It hurts more with movement. Complaint started after hoyer lift was used.   BP 160/90 O2 96 HR 100

## 2022-07-02 NOTE — ED Provider Notes (Signed)
[facility] Provider Note   CSN: [id number] Arrival date & time: 07/02/22  1752     History {Add pertinent medical, surgical, social history, OB history to HPI:1} Chief Complaint  Patient presents with   Muscle Pain    Madison [name] is a 82 y.o. female.  Brought in by ambulance from her facility for pain in her chest.  She thinks it started yesterday after she was put to bed using the standing left.  She denies any specific trauma.  Pain is worse with movement and taking a deep breath.  She feels short of breath then asked them for oxygen but they told her her saturations were good and she did not need it.  She also says for couple of weeks she has not felt well but she cannot explain any specific complaints no fevers or chills no nausea or vomiting.  The history is provided by the patient.  Chest Pain Pain location:  Substernal area, R chest and L chest Pain quality: sharp   Pain severity:  Moderate Onset quality:  Gradual Duration:  1 day Timing:  Intermittent Progression:  Unchanged Chronicity:  New Context: trauma   Relieved by:  None tried Worsened by:  Deep breathing and movement Ineffective treatments:  None tried Associated symptoms: shortness of breath   Associated symptoms: no abdominal pain, no cough, no diaphoresis, no fever, no nausea and no vomiting        Home Medications Prior to Admission medications   Medication Sig Start Date End Date Taking? Authorizing Provider  acetaminophen (TYLENOL) 325 MG tablet Take 650 mg by mouth every 6 (six) hours as needed.    [provider]  albuterol (VENTOLIN HFA) 108 (90 Base) MCG/ACT inhaler Inhale 2 puffs into the lungs every 6 (six) hours as needed for wheezing or shortness of breath.    [provider]  bifidobacterium infantis (ALIGN) capsule Take 1 capsule by mouth daily.    [provider]  Calcium Carb-Cholecalciferol (CALCIUM PLUS VITAMIN D3  PO) Take 1 tablet by mouth daily. Vitamin D3 106mg + Calcium 600    [provider]  ferrous sulfate 325 (65 FE) MG EC tablet Take 325 mg by mouth. Once A Day on Mon, Thu    [provider]  furosemide (LASIX) 40 MG tablet Take 40 mg by mouth 2 (two) times daily.    [provider]  gabapentin (NEURONTIN) 300 MG capsule Take 300 mg by mouth at bedtime. 03/02/20   [provider]  levothyroxine (SYNTHROID) 25 MCG tablet Take 50 mcg by mouth. one time a day every 2 day(s) 02/06/21   [provider]  levothyroxine (SYNTHROID) 25 MCG tablet Take 25 mcg by mouth. one time a day every 2 day(s)    [provider]  loperamide (IMODIUM) 2 MG capsule Take 2 mg by mouth every 4 (four) hours as needed. 02/04/21   [provider]  mineral oil-hydrophilic petrolatum (AQUAPHOR) ointment Apply 1 Application topically as needed for dry skin. Apply to (L) arm &Chest topically two times a day for Itching;Rash related to ASaranap   [provider]  nystatin (MYCOSTATIN/NYSTOP) powder Apply 1 application  topically in the morning, at noon, in the evening, and at bedtime. To genital areas and inner thigh    [provider]  omeprazole (PRILOSEC) 20 MG capsule Take 20 mg by mouth daily.    [provider]  ondansetron (ZOFRAN-ODT)  4 MG disintegrating tablet Take 4 mg by mouth every 8 (eight) hours as needed. 02/04/21   [provider]  potassium chloride SA (KLOR-CON) 20 MEQ tablet Take 20 mEq by mouth daily.    [provider]  Vitamin D, Ergocalciferol, 50 MCG (2000 UT) CAPS Take 2 capsules by mouth daily.    [provider]      Allergies    Keflex [cephalexin], Macrobid [nitrofurantoin], and Amoxicillin    Review of Systems   Review of Systems  Constitutional:  Negative for diaphoresis and fever.  Respiratory:  Positive for shortness of breath. Negative for cough.    Cardiovascular:  Positive for chest pain.  Gastrointestinal:  Negative for abdominal pain, nausea and vomiting.    Physical Exam Updated Vital Signs BP 125/74 (BP Location: Left Arm)   Pulse 98   Temp 98.3 F (36.8 C) (Oral)   Resp 18   SpO2 97%  Physical Exam Vitals and nursing note reviewed.  Constitutional:      General: She is not in acute distress.    Appearance: Normal appearance. She is well-developed. She is obese.  HENT:     Head: Normocephalic and atraumatic.  Eyes:     Conjunctiva/sclera: Conjunctivae normal.  Cardiovascular:     Rate and Rhythm: Normal rate and regular rhythm.     Heart sounds: No murmur heard. Pulmonary:     Effort: Pulmonary effort is normal. No respiratory distress.     Breath sounds: Normal breath sounds.  Chest:     Chest wall: Tenderness present.     Comments: She has some reproducible tenderness when palpating along her sternum. Abdominal:     Palpations: Abdomen is soft.     Tenderness: There is no abdominal tenderness. There is no guarding or rebound.  Musculoskeletal:     Cervical back: Neck supple.  Skin:    General: Skin is warm and dry.     Capillary Refill: Capillary refill takes less than 2 seconds.  Neurological:     Mental Status: She is alert. Mental status is at baseline.     ED Results / Procedures / Treatments   Labs (all labs ordered are listed, but only abnormal results are displayed) Labs Reviewed - No data to display  EKG None  Radiology No results found.  Procedures Procedures  {Document cardiac monitor, telemetry assessment procedure when appropriate:1}  Medications Ordered in ED Medications - No data to display  ED Course/ Medical Decision Making/ A&P   {   Click here for ABCD2, HEART and other calculatorsREFRESH Note before signing :1}                          Medical Decision Making Amount and/or Complexity of Data Reviewed Labs: ordered. Radiology: ordered.   This patient complains of  ***; this involves an extensive number of treatment Options and is a complaint that carries with it a high risk of complications and morbidity. The differential includes ***  I ordered, reviewed and interpreted labs, which included *** I ordered medication *** and reviewed PMP when indicated. I ordered imaging studies which included *** and I independently    visualized and interpreted imaging which showed *** Additional history obtained from *** Previous records obtained and reviewed *** I consulted *** and discussed lab and imaging findings and discussed disposition.  Cardiac monitoring reviewed, *** Social determinants considered, *** Critical Interventions: ***  After the interventions stated above, I reevaluated the  patient and found *** Admission and further testing considered, ***   {Document critical care time when appropriate:1} {Document review of labs and clinical decision tools ie heart score, Chads2Vasc2 etc:1}  {Document your independent review of radiology images, and any outside records:1} {Document your discussion with family members, caretakers, and with consultants:1} {Document social determinants of health affecting pt's care:1} {Document your decision making why or why not admission, treatments were needed:1} Final Clinical Impression(s) / ED Diagnoses Final diagnoses:  None    Rx / DC Orders ED Discharge Orders     None

## 2022-07-03 ENCOUNTER — Encounter (HOSPITAL_COMMUNITY): Payer: Self-pay | Admitting: Internal Medicine

## 2022-07-03 ENCOUNTER — Inpatient Hospital Stay (HOSPITAL_COMMUNITY): Payer: Medicare Other

## 2022-07-03 DIAGNOSIS — F32A Depression, unspecified: Secondary | ICD-10-CM | POA: Diagnosis present

## 2022-07-03 DIAGNOSIS — I130 Hypertensive heart and chronic kidney disease with heart failure and stage 1 through stage 4 chronic kidney disease, or unspecified chronic kidney disease: Secondary | ICD-10-CM | POA: Diagnosis present

## 2022-07-03 DIAGNOSIS — I700 Atherosclerosis of aorta: Secondary | ICD-10-CM | POA: Diagnosis present

## 2022-07-03 DIAGNOSIS — D631 Anemia in chronic kidney disease: Secondary | ICD-10-CM | POA: Diagnosis present

## 2022-07-03 DIAGNOSIS — K2100 Gastro-esophageal reflux disease with esophagitis, without bleeding: Secondary | ICD-10-CM | POA: Diagnosis not present

## 2022-07-03 DIAGNOSIS — L03115 Cellulitis of right lower limb: Secondary | ICD-10-CM

## 2022-07-03 DIAGNOSIS — E871 Hypo-osmolality and hyponatremia: Secondary | ICD-10-CM | POA: Diagnosis present

## 2022-07-03 DIAGNOSIS — R0789 Other chest pain: Secondary | ICD-10-CM

## 2022-07-03 DIAGNOSIS — I3139 Other pericardial effusion (noninflammatory): Secondary | ICD-10-CM | POA: Diagnosis present

## 2022-07-03 DIAGNOSIS — A419 Sepsis, unspecified organism: Principal | ICD-10-CM

## 2022-07-03 DIAGNOSIS — N1831 Chronic kidney disease, stage 3a: Secondary | ICD-10-CM

## 2022-07-03 DIAGNOSIS — L089 Local infection of the skin and subcutaneous tissue, unspecified: Secondary | ICD-10-CM | POA: Insufficient documentation

## 2022-07-03 DIAGNOSIS — Z87891 Personal history of nicotine dependence: Secondary | ICD-10-CM | POA: Diagnosis not present

## 2022-07-03 DIAGNOSIS — E872 Acidosis, unspecified: Secondary | ICD-10-CM | POA: Diagnosis present

## 2022-07-03 DIAGNOSIS — J9601 Acute respiratory failure with hypoxia: Secondary | ICD-10-CM | POA: Diagnosis present

## 2022-07-03 DIAGNOSIS — E039 Hypothyroidism, unspecified: Secondary | ICD-10-CM

## 2022-07-03 DIAGNOSIS — Z993 Dependence on wheelchair: Secondary | ICD-10-CM | POA: Diagnosis not present

## 2022-07-03 DIAGNOSIS — I5032 Chronic diastolic (congestive) heart failure: Secondary | ICD-10-CM | POA: Diagnosis present

## 2022-07-03 DIAGNOSIS — E8809 Other disorders of plasma-protein metabolism, not elsewhere classified: Secondary | ICD-10-CM | POA: Diagnosis present

## 2022-07-03 DIAGNOSIS — Z880 Allergy status to penicillin: Secondary | ICD-10-CM | POA: Diagnosis not present

## 2022-07-03 DIAGNOSIS — K219 Gastro-esophageal reflux disease without esophagitis: Secondary | ICD-10-CM | POA: Diagnosis present

## 2022-07-03 DIAGNOSIS — Z1152 Encounter for screening for COVID-19: Secondary | ICD-10-CM | POA: Diagnosis not present

## 2022-07-03 DIAGNOSIS — N179 Acute kidney failure, unspecified: Secondary | ICD-10-CM | POA: Diagnosis present

## 2022-07-03 DIAGNOSIS — J9811 Atelectasis: Secondary | ICD-10-CM | POA: Diagnosis present

## 2022-07-03 DIAGNOSIS — E876 Hypokalemia: Secondary | ICD-10-CM | POA: Diagnosis present

## 2022-07-03 DIAGNOSIS — G4733 Obstructive sleep apnea (adult) (pediatric): Secondary | ICD-10-CM

## 2022-07-03 DIAGNOSIS — Z6841 Body Mass Index (BMI) 40.0 and over, adult: Secondary | ICD-10-CM | POA: Diagnosis not present

## 2022-07-03 LAB — CBC WITH DIFFERENTIAL/PLATELET
Abs Immature Granulocytes: 0.12 10*3/uL — ABNORMAL HIGH (ref 0.00–0.07)
Basophils Absolute: 0 10*3/uL (ref 0.0–0.1)
Basophils Relative: 0 %
Eosinophils Absolute: 0 10*3/uL (ref 0.0–0.5)
Eosinophils Relative: 0 %
HCT: 31.8 % — ABNORMAL LOW (ref 36.0–46.0)
Hemoglobin: 9.3 g/dL — ABNORMAL LOW (ref 12.0–15.0)
Immature Granulocytes: 1 %
Lymphocytes Relative: 7 %
Lymphs Abs: 0.8 10*3/uL (ref 0.7–4.0)
MCH: 27.5 pg (ref 26.0–34.0)
MCHC: 29.2 g/dL — ABNORMAL LOW (ref 30.0–36.0)
MCV: 94.1 fL (ref 80.0–100.0)
Monocytes Absolute: 0.5 10*3/uL (ref 0.1–1.0)
Monocytes Relative: 4 %
Neutro Abs: 10.4 10*3/uL — ABNORMAL HIGH (ref 1.7–7.7)
Neutrophils Relative %: 88 %
Platelets: 326 10*3/uL (ref 150–400)
RBC: 3.38 MIL/uL — ABNORMAL LOW (ref 3.87–5.11)
RDW: 13.4 % (ref 11.5–15.5)
WBC: 11.9 10*3/uL — ABNORMAL HIGH (ref 4.0–10.5)
nRBC: 0 % (ref 0.0–0.2)

## 2022-07-03 LAB — COMPREHENSIVE METABOLIC PANEL
ALT: 13 U/L (ref 0–44)
ALT: 19 U/L (ref 0–44)
AST: 17 U/L (ref 15–41)
AST: 24 U/L (ref 15–41)
Albumin: 1.6 g/dL — ABNORMAL LOW (ref 3.5–5.0)
Albumin: 2.2 g/dL — ABNORMAL LOW (ref 3.5–5.0)
Alkaline Phosphatase: 58 U/L (ref 38–126)
Alkaline Phosphatase: 88 U/L (ref 38–126)
Anion gap: 16 — ABNORMAL HIGH (ref 5–15)
Anion gap: 7 (ref 5–15)
BUN: 16 mg/dL (ref 8–23)
BUN: 22 mg/dL (ref 8–23)
CO2: 17 mmol/L — ABNORMAL LOW (ref 22–32)
CO2: 21 mmol/L — ABNORMAL LOW (ref 22–32)
Calcium: 6.1 mg/dL — CL (ref 8.9–10.3)
Calcium: 8 mg/dL — ABNORMAL LOW (ref 8.9–10.3)
Chloride: 89 mmol/L — ABNORMAL LOW (ref 98–111)
Chloride: 103 mmol/L (ref 98–111)
Creatinine, Ser: 0.78 mg/dL (ref 0.44–1.00)
Creatinine, Ser: 1.12 mg/dL — ABNORMAL HIGH (ref 0.44–1.00)
GFR, Estimated: >60 mL/min (ref >60)
GFR, Estimated: 49 mL/min — ABNORMAL LOW (ref >60)
Glucose, Bld: 94 mg/dL (ref 70–99)
Glucose, Bld: 107 mg/dL — ABNORMAL HIGH (ref 70–99)
Potassium: 2.7 mmol/L — CL (ref 3.5–5.1)
Potassium: 3.4 mmol/L — ABNORMAL LOW (ref 3.5–5.1)
Sodium: 122 mmol/L — ABNORMAL LOW (ref 135–145)
Sodium: 131 mmol/L — ABNORMAL LOW (ref 135–145)
Total Bilirubin: 5.2 mg/dL — ABNORMAL HIGH (ref 0.3–1.2)
Total Bilirubin: 0.7 mg/dL (ref 0.3–1.2)
Total Protein: 4.6 g/dL — ABNORMAL LOW (ref 6.5–8.1)
Total Protein: 6.4 g/dL — ABNORMAL LOW (ref 6.5–8.1)

## 2022-07-03 LAB — PHOSPHORUS: Phosphorus: 3.3 mg/dL (ref 2.5–4.6)

## 2022-07-03 LAB — C-REACTIVE PROTEIN: CRP: 27.2 mg/dL — ABNORMAL HIGH (ref <1.0)

## 2022-07-03 LAB — PROTIME-INR
INR: 1.6 — ABNORMAL HIGH (ref 0.8–1.2)
Prothrombin Time: 19.2 seconds — ABNORMAL HIGH (ref 11.4–15.2)

## 2022-07-03 LAB — SEDIMENTATION RATE: Sed Rate: 85 mm/hr — ABNORMAL HIGH (ref 0–22)

## 2022-07-03 LAB — TROPONIN I (HIGH SENSITIVITY): Troponin I (High Sensitivity): 8 ng/L (ref <18)

## 2022-07-03 LAB — MAGNESIUM
Magnesium: 1.5 mg/dL — ABNORMAL LOW (ref 1.7–2.4)
Magnesium: 2.1 mg/dL (ref 1.7–2.4)

## 2022-07-03 LAB — LACTIC ACID, PLASMA: Lactic Acid, Venous: 1.6 mmol/L (ref 0.5–1.9)

## 2022-07-03 LAB — MRSA NEXT GEN BY PCR, NASAL: MRSA by PCR Next Gen: NOT DETECTED

## 2022-07-03 MED ORDER — POTASSIUM CHLORIDE CRYS ER 20 MEQ PO TBCR
40.0000 meq | EXTENDED_RELEASE_TABLET | Freq: Two times a day (BID) | ORAL | Status: AC
  Administered 2022-07-03 (×2): 40 meq via ORAL
  Filled 2022-07-03 (×2): qty 2

## 2022-07-03 MED ORDER — SODIUM CHLORIDE 0.9 % IV SOLN: INTRAVENOUS | Status: DC

## 2022-07-03 MED ORDER — IPRATROPIUM BROMIDE 0.02 % IN SOLN
0.5000 mg | Freq: Four times a day (QID) | RESPIRATORY_TRACT | Status: DC
  Administered 2022-07-03 (×2): 0.5 mg via RESPIRATORY_TRACT
  Filled 2022-07-03 (×2): qty 2.5

## 2022-07-03 MED ORDER — MELATONIN 3 MG PO TABS: 3.0000 mg | ORAL_TABLET | Freq: Every evening | ORAL | Status: DC | PRN

## 2022-07-03 MED ORDER — IPRATROPIUM BROMIDE 0.02 % IN SOLN
0.5000 mg | Freq: Three times a day (TID) | RESPIRATORY_TRACT | Status: DC
  Administered 2022-07-04: 0.5 mg via RESPIRATORY_TRACT
  Filled 2022-07-03: qty 2.5

## 2022-07-03 MED ORDER — SODIUM CHLORIDE 0.9 % IV SOLN
INTRAVENOUS | Status: AC
  Administered 2022-07-03: 1000 mL via INTRAVENOUS

## 2022-07-03 MED ORDER — PANTOPRAZOLE SODIUM 40 MG PO TBEC
40.0000 mg | DELAYED_RELEASE_TABLET | Freq: Every day | ORAL | Status: DC
  Administered 2022-07-03 – 2022-07-07 (×5): 40 mg via ORAL
  Filled 2022-07-03 (×5): qty 1

## 2022-07-03 MED ORDER — LEVALBUTEROL HCL 0.63 MG/3ML IN NEBU
0.6300 mg | INHALATION_SOLUTION | Freq: Three times a day (TID) | RESPIRATORY_TRACT | Status: DC
  Administered 2022-07-04: 0.63 mg via RESPIRATORY_TRACT
  Filled 2022-07-03: qty 3

## 2022-07-03 MED ORDER — VANCOMYCIN HCL 2000 MG/400ML IV SOLN
2000.0000 mg | Freq: Once | INTRAVENOUS | Status: AC
  Administered 2022-07-03: 2000 mg via INTRAVENOUS
  Filled 2022-07-03: qty 400

## 2022-07-03 MED ORDER — LEVALBUTEROL HCL 0.63 MG/3ML IN NEBU
0.6300 mg | INHALATION_SOLUTION | Freq: Four times a day (QID) | RESPIRATORY_TRACT | Status: DC
  Administered 2022-07-03 (×2): 0.63 mg via RESPIRATORY_TRACT
  Filled 2022-07-03 (×2): qty 3

## 2022-07-03 MED ORDER — ACETAMINOPHEN 650 MG RE SUPP: 650.0000 mg | Freq: Four times a day (QID) | RECTAL | Status: DC | PRN

## 2022-07-03 MED ORDER — VANCOMYCIN HCL 1750 MG/350ML IV SOLN: 1750.0000 mg | INTRAVENOUS | Status: DC

## 2022-07-03 MED ORDER — PANTOPRAZOLE SODIUM 40 MG IV SOLR: 40.0000 mg | INTRAVENOUS | Status: DC

## 2022-07-03 MED ORDER — SODIUM CHLORIDE 0.9 % IV SOLN
1.0000 g | INTRAVENOUS | Status: DC
  Administered 2022-07-03: 1 g via INTRAVENOUS
  Filled 2022-07-03: qty 10

## 2022-07-03 MED ORDER — ACETAMINOPHEN 325 MG PO TABS: 650.0000 mg | ORAL_TABLET | Freq: Four times a day (QID) | ORAL | Status: DC | PRN

## 2022-07-03 MED ORDER — FENTANYL CITRATE PF 50 MCG/ML IJ SOSY: 50.0000 ug | PREFILLED_SYRINGE | INTRAMUSCULAR | Status: DC | PRN

## 2022-07-03 MED ORDER — VANCOMYCIN HCL 2000 MG/400ML IV SOLN
2000.0000 mg | INTRAVENOUS | Status: DC
  Administered 2022-07-04: 2000 mg via INTRAVENOUS
  Filled 2022-07-03: qty 400

## 2022-07-03 MED ORDER — NALOXONE HCL 0.4 MG/ML IJ SOLN: 0.4000 mg | INTRAMUSCULAR | Status: DC | PRN

## 2022-07-03 MED ORDER — ONDANSETRON HCL 4 MG/2ML IJ SOLN: 4.0000 mg | Freq: Four times a day (QID) | INTRAMUSCULAR | Status: DC | PRN

## 2022-07-03 MED ORDER — MORPHINE SULFATE (PF) 4 MG/ML IV SOLN
4.0000 mg | Freq: Once | INTRAVENOUS | Status: AC
  Administered 2022-07-03: 4 mg via INTRAVENOUS
  Filled 2022-07-03: qty 1

## 2022-07-03 MED ORDER — GUAIFENESIN ER 600 MG PO TB12
1200.0000 mg | ORAL_TABLET | Freq: Two times a day (BID) | ORAL | Status: DC
  Administered 2022-07-03 – 2022-07-07 (×8): 1200 mg via ORAL
  Filled 2022-07-03 (×8): qty 2

## 2022-07-03 NOTE — Consult Note (Signed)
Reason for Consult: right hip cellulitis Referring Physician: Alfredia Ferguson, [place] is an 82 y.o. female.  HPI: Patient known to me from prior evaluation of her very complex surgical history involving her right hip.  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  After lengthy discussions including possible evaluation at [facility] no further surgery has been planned.  82 y.o. female with medical history significant for CKD 3 A-CYS with baseline creatinine range 1.2-1.4, obstructive sleep apnea on home nocturnal CPAP, cortical thyroidism, chronic diastolic heart failure, who is admitted to [facility] on 07/02/2022 with sepsis due to cellulitis over the right hip after presenting from home to [facility] ED complaining of right hip erythema.    Past Medical History:  Diagnosis Date   Benign hypertension with chronic kidney disease, stage III ([place])    Overview:  Last Assessment & Plan:  Usually the patient is hypertensive. However today her blood pressure is [id number] systolic. She has been feeling fatigued with some lightheadedness. Part of this may be related to her lower blood pressure. Her pressure may be improved with her decrease in salt and fluid intake. I've instructed her to put her lisinopril/hctz on hold. 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 It was possibly reentrant. She does well with diltiazem. No change in therapy.   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   Thyroid disease    Urinary incontinence     Past Surgical History:  Procedure Laterality Date   CATARACT EXTRACTION Left 8/11   CATARACT EXTRACTION Right 3/12   CHOLECYSTECTOMY  1994   Copsilotomy Laser Treatment Left 6/12   eye   ELBOW SURGERY  8/12   EYE SURGERY Left 09/2008   macular hole   EYE SURGERY Right 12/11   Lumbar Infusion  2011   MOLE REMOVAL  06/17/2015   TOTAL KNEE ARTHROPLASTY Left 6/11   TOTAL KNEE ARTHROPLASTY Right 06/13/2011    Family History  Problem Relation Age of Onset   High blood pressure Mother    Diabetes Mother    Heart Problems Father    Diabetes Father    Prostate cancer Father    Heart attack Father    Heart attack Paternal Grandmother    Heart attack Maternal Grandfather    Heart attack Paternal Uncle    Stroke Neg Hx     Social History:  reports that she has quit smoking. She has never used smokeless tobacco. She reports that  she does not drink alcohol and does not use drugs.  Allergies:  Allergies  Allergen Reactions   Keflex [Cephalexin] Diarrhea   Macrobid [Nitrofurantoin] Hives   Amoxicillin Rash    Medications: I have reviewed the patient's current medications. Scheduled:  guaiFENesin  1,200 mg Oral BID   ipratropium  0.5 mg Nebulization Q6H   levalbuterol  0.63 mg Nebulization Q6H   pantoprazole  40 mg Oral Daily   potassium chloride  40 mEq Oral BID    Results for orders placed or performed during the hospital encounter of 07/02/22 (from the past 24  hour(s))  Comprehensive metabolic panel     Status: Abnormal   Collection Time: 07/02/22  6:35 PM  Result Value Ref Range   Sodium 134 (L) 135 - 145 mmol/L   Potassium 3.6 3.5 - 5.1 mmol/L   Chloride 101 98 - 111 mmol/L   CO2 23 22 - 32 mmol/L   Glucose, Bld 130 (H) 70 - 99 mg/dL   BUN 22 8 - 23 mg/dL   Creatinine, Ser 1.32 (H) 0.44 - 1.00 mg/dL   Calcium 8.5 (L) 8.9 - 10.3 mg/dL   Total Protein 7.2 6.5 - 8.1 g/dL   Albumin 2.6 (L) 3.5 - 5.0 g/dL   AST 31 15 - 41 U/L   ALT 23 0 - 44 U/L   Alkaline Phosphatase 92 38 - 126 U/L   Total Bilirubin 0.6 0.3 - 1.2 mg/dL   GFR, Estimated 41 (L) >60 mL/min   Anion gap 10 5 - 15  Troponin I (High Sensitivity)     Status: None   Collection Time: 07/02/22  6:35 PM  Result Value Ref Range   Troponin I (High Sensitivity) 7 <18 ng/L  CBC with Differential     Status: Abnormal   Collection Time: 07/02/22  6:35 PM  Result Value Ref Range   WBC 19.7 (H) 4.0 - 10.5 K/uL   RBC 4.63 3.87 - 5.11 MIL/uL   Hemoglobin 12.7 12.0 - 15.0 g/dL   HCT 41.1 36.0 - 46.0 %   MCV 88.8 80.0 - 100.0 fL   MCH 27.4 26.0 - 34.0 pg   MCHC 30.9 30.0 - 36.0 g/dL   RDW 13.5 11.5 - 15.5 %   Platelets 455 (H) 150 - 400 K/uL   nRBC 0.0 0.0 - 0.2 %   Neutrophils Relative % 89 %   Neutro Abs 17.5 (H) 1.7 - 7.7 K/uL   Lymphocytes Relative 3 %   Lymphs Abs 0.6 (L) 0.7 - 4.0 K/uL   Monocytes Relative 7 %   Monocytes Absolute 1.3 (H) 0.1 - 1.0 K/uL   Eosinophils Relative 0 %   Eosinophils Absolute 0.0 0.0 - 0.5 K/uL   Basophils Relative 0 %   Basophils Absolute 0.1 0.0 - 0.1 K/uL   Immature Granulocytes 1 %   Abs Immature Granulocytes 0.17 (H) 0.00 - 0.07 K/uL  Resp panel by RT-PCR (RSV, Flu A&B, Covid) Anterior Nasal Swab     Status: None   Collection Time: 07/02/22  6:35 PM   Specimen: Anterior Nasal Swab  Result Value Ref Range   SARS Coronavirus 2 by RT PCR NEGATIVE NEGATIVE   Influenza A by PCR NEGATIVE NEGATIVE   Influenza B by PCR NEGATIVE NEGATIVE   Resp  Syncytial Virus by PCR NEGATIVE NEGATIVE  Brain natriuretic peptide     Status: Abnormal   Collection Time: 07/02/22  6:35 PM  Result Value Ref Range   B Natriuretic Peptide  142.0 (H) 0.0 - 100.0 pg/mL  Culture, blood (routine x 2)     Status: None (Preliminary result)   Collection Time: 07/02/22 10:00 PM   Specimen: BLOOD  Result Value Ref Range   Specimen Description      BLOOD SITE NOT SPECIFIED Performed at [facility], [place] [address], [place], [place] [address]    Special Requests      BOTTLES DRAWN AEROBIC AND ANAEROBIC Blood Culture results may not be optimal due to an inadequate volume of blood received in culture bottles Performed at [facility], [place] [address], [place], [place] [address]    Culture      NO GROWTH < 12 HOURS Performed at [facility] [address] [place], [place] [address]    Report Status PENDING   Culture, blood (routine x 2)     Status: None (Preliminary result)   Collection Time: 07/02/22 10:15 PM   Specimen: BLOOD  Result Value Ref Range   Specimen Description      BLOOD BLOOD RIGHT HAND Performed at [facility] [address] [place], [place] [address]    Special Requests      BOTTLES DRAWN AEROBIC AND ANAEROBIC Blood Culture results may not be optimal due to an excessive volume of blood received in culture bottles Performed at [facility] [address], [place], [place] [address]    Culture      NO GROWTH <12 HOURS Performed at [facility] [address], [place], [place] [address]    Report Status PENDING   Lactic acid, plasma     Status: None   Collection Time: 07/02/22 10:30 PM  Result Value Ref Range   Lactic Acid, Venous 1.6 0.5 - 1.9 mmol/L  Sedimentation rate     Status: Abnormal   Collection Time: 07/02/22 10:30 PM  Result Value Ref Range   Sed Rate 85 (H) 0 - 22 mm/hr  C-reactive protein     Status: Abnormal   Collection Time: 07/02/22  10:30 PM  Result Value Ref Range   CRP 27.2 (H) <1.0 mg/dL  Troponin I (High Sensitivity)     Status: None   Collection Time: 07/03/22 12:59 AM  Result Value Ref Range   Troponin I (High Sensitivity) 8 <18 ng/L  CBC with Differential/Platelet     Status: Abnormal   Collection Time: 07/03/22  5:00 AM  Result Value Ref Range   WBC 11.9 (H) 4.0 - 10.5 K/uL   RBC 3.38 (L) 3.87 - 5.11 MIL/uL   Hemoglobin 9.3 (L) 12.0 - 15.0 g/dL   HCT 31.8 (L) 36.0 - 46.0 %   MCV 94.1 80.0 - 100.0 fL   MCH 27.5 26.0 - 34.0 pg   MCHC 29.2 (L) 30.0 - 36.0 g/dL   RDW 13.4 11.5 - 15.5 %   Platelets 326 150 - 400 K/uL   nRBC 0.0 0.0 - 0.2 %   Neutrophils Relative % 88 %   Neutro Abs 10.4 (H) 1.7 - 7.7 K/uL   Lymphocytes Relative 7 %   Lymphs Abs 0.8 0.7 - 4.0 K/uL   Monocytes Relative 4 %   Monocytes Absolute 0.5 0.1 - 1.0 K/uL   Eosinophils Relative 0 %   Eosinophils Absolute 0.0 0.0 - 0.5 K/uL   Basophils Relative 0 %   Basophils Absolute 0.0 0.0 - 0.1 K/uL   Immature Granulocytes 1 %   Abs Immature Granulocytes 0.12 (H) 0.00 -  0.07 K/uL  Comprehensive metabolic panel     Status: Abnormal   Collection Time: 07/03/22  5:00 AM  Result Value Ref Range   Sodium 122 (L) 135 - 145 mmol/L   Potassium 2.7 (LL) 3.5 - 5.1 mmol/L   Chloride 89 (L) 98 - 111 mmol/L   CO2 17 (L) 22 - 32 mmol/L   Glucose, Bld 94 70 - 99 mg/dL   BUN 16 8 - 23 mg/dL   Creatinine, Ser 0.78 0.44 - 1.00 mg/dL   Calcium 6.1 (LL) 8.9 - 10.3 mg/dL   Total Protein 4.6 (L) 6.5 - 8.1 g/dL   Albumin 1.6 (L) 3.5 - 5.0 g/dL   AST 17 15 - 41 U/L   ALT 13 0 - 44 U/L   Alkaline Phosphatase 58 38 - 126 U/L   Total Bilirubin 5.2 (H) 0.3 - 1.2 mg/dL   GFR, Estimated >60 >60 mL/min   Anion gap 16 (H) 5 - 15  Magnesium     Status: Abnormal   Collection Time: 07/03/22  5:00 AM  Result Value Ref Range   Magnesium 1.5 (L) 1.7 - 2.4 mg/dL  Protime-INR     Status: Abnormal   Collection Time: 07/03/22  5:00 AM  Result Value Ref Range    Prothrombin Time 19.2 (H) 11.4 - 15.2 seconds   INR 1.6 (H) 0.8 - 1.2  Comprehensive metabolic panel     Status: Abnormal   Collection Time: 07/03/22  8:26 AM  Result Value Ref Range   Sodium 131 (L) 135 - 145 mmol/L   Potassium 3.4 (L) 3.5 - 5.1 mmol/L   Chloride 103 98 - 111 mmol/L   CO2 21 (L) 22 - 32 mmol/L   Glucose, Bld 107 (H) 70 - 99 mg/dL   BUN 22 8 - 23 mg/dL   Creatinine, Ser 1.12 (H) 0.44 - 1.00 mg/dL   Calcium 8.0 (L) 8.9 - 10.3 mg/dL   Total Protein 6.4 (L) 6.5 - 8.1 g/dL   Albumin 2.2 (L) 3.5 - 5.0 g/dL   AST 24 15 - 41 U/L   ALT 19 0 - 44 U/L   Alkaline Phosphatase 88 38 - 126 U/L   Total Bilirubin 0.7 0.3 - 1.2 mg/dL   GFR, Estimated 49 (L) >60 mL/min   Anion gap 7 5 - 15  Magnesium     Status: None   Collection Time: 07/03/22  8:26 AM  Result Value Ref Range   Magnesium 2.1 1.7 - 2.4 mg/dL  Phosphorus     Status: None   Collection Time: 07/03/22  8:26 AM  Result Value Ref Range   Phosphorus 3.3 2.5 - 4.6 mg/dL  MRSA Next Gen by PCR, Nasal     Status: None   Collection Time: 07/03/22  5:10 PM   Specimen: Nasal Mucosa; Nasal Swab  Result Value Ref Range   MRSA by PCR Next Gen NOT DETECTED NOT DETECTED     X-ray: CLINICAL DATA:  Hip pain with redness and swelling to the right hip   EXAM: DG HIP (WITH OR WITHOUT PELVIS) 2-3V RIGHT   COMPARISON:  CT of the right hip 06/08/2021   FINDINGS: Findings appear similar to CT 06/08/2021 given differences in technique. Changes from prior right hip arthroplasty with subsequent hardware removal. Fragmentation of the right greater trochanter. Cortical thickening and sclerosis about the proximal femoral shaft and acetabulum. No definite acute fracture. The soft tissue thickening and fluid about the right hip was better demonstrated on CT.  Contrast within the bladder   IMPRESSION: No definite acute fracture. Similar changes about the right hip from right hip arthroplasty and subsequent heart were removal  when compared with CT 06/08/2021.     Electronically Signed   By: Placido Sou M.D.  CLINICAL DATA:  Septic arthritis suspected. Hip x-ray done. Hip pain and redness in the right hip.   EXAM: CT OF THE RIGHT HIP WITHOUT CONTRAST   TECHNIQUE: Multidetector CT imaging of the right hip was performed according to the standard protocol. Multiplanar CT image reconstructions were also generated.   RADIATION DOSE REDUCTION: This exam was performed according to the departmental dose-optimization program which includes automated exposure control, adjustment of the mA and/or kV according to patient size and/or use of iterative reconstruction technique.   COMPARISON:  Hip radiographs earlier today and CT right hip 06/08/2021   FINDINGS: Bones/Joint/Cartilage   Redemonstrated changes from prior right hip arthroplasty with subsequent removal. Increased callus formation about the acetabulum with redemonstrated fragmentation along the medial aspect of the acetabulum. Unchanged fragmentation of the right greater trochanter. MR nondisplaced longitudinal fracture and osseous fragmentation about the proximal femoral diaphysis. The proximal end of the femoral diaphysis has shifted anteriorly compared to 06/08/2021. Unchanged mixed soft tissue and fluid components at the site of the prior femoral head and neck component compatible with granulation tissue. There may be a central fluid component/seroma though this is similar to decreased from 06/08/2021.   Ligaments   Suboptimally assessed by CT.   Muscles and Tendons   Muscle atrophy of the gluteal muscles.   Soft tissues   Similar postsurgical scarring in the subcutaneous fat anterior to the right hip. Increased size of the thick-walled fluid collection in the lateral right subcutaneous fat extending medially to the greater trochanter fragmentation. This is not completely visualized on this exam and extends laterally beyond the  lateral aspect of the scan. Suggestion of adjacent stranding lateral to the fluid collection though this is largely excluded from the field-of-view.   IMPRESSION: 1. 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 There is some interval healing change since 06/08/2021. 2. Unchanged granulation tissue and possible central seroma about the proximal end of the femoral diaphysis and acetabulum. 3. Increased size of the thick-walled fluid collection in the lateral right subcutaneous fat extending medially to the greater trochanter fragmentation. This is not completely visualized on this exam and extends laterally beyond the field-of-view. There is the suggestion of subcutaneous stranding lateral to the fluid collection. Clinical correlation is recommended to exclude cellulitis. If there is concern for abscess, consider further evaluation with ultrasound and/or fluid sampling. It is unclear if the large subcutaneous fluid collection communicates with the joint space.     Electronically Signed   By: Placido Sou M.D.  ROS: As per HPI otherwise 10 point review of systems negative.    Blood pressure (!) 122/51, pulse 84, temperature 98.3 F (36.8 C), temperature source Oral, resp. rate 20, SpO2 96 %.  Physical Exam: General: appears to be stated age; alert, oriented Skin: warm, dry, erythema over the right hip associated tenderness, increased warmth, swelling, in the absence of any associated drainage Head:  AT/[place] Mouth:  Oral mucosa membranes appear moist, normal dentition Neck: supple; trachea midline Heart:  RRR; did not appreciate any M/R/G Lungs: CTAB, did not appreciate any wheezes, rales, or rhonchi Abdomen: + BS; soft, ND, NT Vascular: 2+ pedal pulses b/l; 2+ radial pulses b/l Right hip exam: Erythema involving the right  hip Mild warmth No obvious fluctuance  ROM not assessed based on the condition of her right hip Neuro: strength and  sensation intact in upper and lower extremities b/l    Assessment/Plan: Very complex surgical history involving her right hip presenting now with what seems to be consistent with cellulitis based on exam and her CT scan comparisons  Plan: At this point surgical intervention not indicated I think it would be much better for her to be placed on broad spectrum ABX and observe the response over the next few days I am not sure that there is any obvious enough fluid collection to have IR drain but even so I think it would be best to reduce the erythematous reaction prior to even considering I will follow   Mauri Pole 07/03/2022, 6:45 PM

## 2022-07-03 NOTE — Progress Notes (Signed)
Patient seen by Dr. Alvan Dame this AM. Formal consult note to follow.  Exam and imaging consistent with cellulitis CT scan shows no significant changes from prior studies No surgical intervention indicated currently Continue IV antibiotics Regular diet added  Costella Hatcher, PA-C Orthopedic Surgery [facility] [phone]

## 2022-07-03 NOTE — Progress Notes (Signed)
RT instructed patient and family on the use of a flutter valve and incentive spirometer. Patient was able to demonstrate back good technique. IS 325 mL.

## 2022-07-03 NOTE — Progress Notes (Addendum)
Care started prior to midnight in the emergency room and patient was admitted early this morning after midnight by Dr. Babs Bertin and I am in agreement with his Assessment and Plan. Additional changes to the plan of care have been made accordingly.  The patient is a morbidly obese 82 year old elderly Caucasian female with a past medical history significant for but not limited to CKD stage IIIa with a baseline creatinine ranging from 1.2-1.4, obstructive sleep apnea on nocturnal CPAP, hypothyroidism, chronic diastolic CHF as well as other comorbidities who presented with 3 days of worsening progressive erythema of the right hip associated tenderness, increased warmth and swelling.  She denies any acute numbness or paresthesias and denies any rash in that location had no recent fall or trauma and is not on any blood thinners.  The right hip erythema has not been associated with any drainage or any purulent discharges.  Also she has noted some bilateral anterior chest wall pain worse with palpation and worse with rotational movement in the upper torso which is nonexertional and nonpleuritic.  Denies any recent headache, neck stiffness, abdominal pain or recent dysuria or gross hematuria.  She has had a history of a right total hip arthroplasty performed by orthopedic surgery at [facility] and subsequent to that [facility] has taken over as primary Ortho Surgical group for her.  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  In the ED she was afebrile and blood pressures in the 110s to 130s with a respiratory rate of 18-23 and O2 saturation of 90 to 94% on room air.  Blood cultures x 2 were obtained and her CRP was elevated to 27.2 and WBC was elevated at 19.7.  She underwent a CTA of the  chest PE protocol to evaluate for her chest pain and it shows small bilateral pleural effusions with adjacent atelectasis in the upper lobes but there was no evidence of any acute cardiopulmonary processes, pulmonary embolus or evidence of infiltrate or pneumothorax.  Subsequently she underwent a CT of the hip which showed evidence of subcutaneous fat stranding over the right hip consistent with cellulitis without corresponding evidence of subcutaneous gas.  CT of the hip also showed a fluid collection the right lateral subcutaneous fat extending medially towards the greater trochanter however there is potential fluid collection was noted to extend laterally beyond the field-of-view rendering unclear if it communicates with the joint space.  EDP consulted orthopedic surgery and she was given IV fentanyl x 2 as well as initiate on antibiotics.  She was admitted for the evaluation and management for sepsis secondary to cellulitis over the right hip along with atypical chest pain.  She is being evaluated and treated for the following but not limited to:  Sepsis secondary to cellulitis of the Right Hip -She has had 2 to 3 days of progressive right hip erythema associated tenderness, increased warmth to touch as well as swelling and CT of the right hip demonstrated "Redemonstrated changes from prior right hip arthroplasty with subsequent removal. There is some interval healing change since 06/08/2021. Unchanged granulation tissue and possible central seroma about the proximal end of the femoral diaphysis and acetabulum. Increased size of the thick-walled fluid collection in the lateral right subcutaneous fat extending medially to the greater trochanter fragmentation. This is not  completely visualized on this exam and extends laterally beyond the field-of-view. There is the suggestion of subcutaneous stranding lateral to the fluid collection. Clinical correlation is recommended to exclude cellulitis. If there is  concern for abscess, consider further evaluation with ultrasound and/or fluid sampling. It is unclear if the large subcutaneous fluid collection communicates with the joint space." -She met SIRS criteria on admission given that she had leukocytosis, tachycardia, tachypnea and a source of infection and did not appear to have any endorgan damage and did not meet severe sepsis criteria -She did not receive the 30 mL/kg IV fluid bolus given her history of heart failure and the presentation most likely is due to severe nonpurulent cellulitis -ESR was 27.2 -Cultures x 2 were obtained and she was placed on antibiotics with IV vancomycin and IV ceftriaxone for now; patient admits that she normally has a prosthesis so initiation of IV cefepime for antipseudomonal coverage at this time was deferred -WBC Trend: Recent Labs  Lab 07/02/22 1835 07/03/22 0500  WBC 19.7* 11.9*  -Orthopedic surgery (Dr. Alvan Dame) has been consulted for further evaluation and Formal consult note is pending but they feel that the exam and imaging is consistent with cellulitis and patient has a CT scan shows no significant changes from prior studies and recommending no surgical intervention currently indicated at this time -Continue with pain control and she is made n.p.o. for possible surgical intervention.  Surgery has evaluated and recommending no acute surgical intervention needed at this time so she has been given a diet -Monitor cultures and temperature curve  Atypical chest pain rule out ACS -She has had 3 to 4 days of bilateral anterior chest wall discomfort which is worse with palpation in addition to being exacerbated with rotational movement of the upper torso which is likely suggestive of musculoskeletal involvement -CTA of the chest showed no evidence of acute processes and did show cardiomegaly with small pericardial effusion and three-vessel coronary atherosclerosis as well as small bilateral pleural effusions with adjacent  atelectasis in the lower lobes and aortic atherosclerosis -Pain was felt to be nonexertional and appeared atypical for ACS -EKG was nonacute and showed no ischemic changes and no evidence of ST elevation -Troponin went from 7 -> 8 -Continue to monitor on telemetry  Wheezing -She had no evidence of arterial dilatation to the segmental divisions of the segmental arterial bed is obscured by breathing motion on CTA -CXR done and showed "Low lung volumes with bronchovascular crowding. Bibasilar and bilateral perihilar patchy opacities, which could be due to atelectasis or infection." -She was noted to have some wheezing -Will add Xopenex and Atrovent and supplemental oxygen via nasal cannula if necessary =We will add guaifenesin, flutter valve and incentive spirometer  Chronic Diastolic CHF -BNP was [id number] -Hold IVF even the Cr slightly went up from CTA; CTA noted cardiomegaly with small pleural cardial effusion and three-vessel coronary atherosclerosis as well as small bilateral pleural effusions with adjacent atelectasis in the lower lobes -Strict I's and O's and Daily Weights  Intake/Output Summary (Last 24 hours) at 07/03/2022 1540 Last data filed at 07/03/2022 1500 Gross per 24 hour  Intake 120 ml  Output --  Net 120 ml  -Started gentle IVF with NS at 75 mL/hr x 12 hours -Continue to monitor for signs and symptoms of volume overload -Repeat chest x-ray in the a.m. and repeat chest x-ray this afternoon pending  CKD Stage 3a Metabolic Acidosis -BUN/Cr Trend: Recent Labs  Lab 07/02/22 1835 07/03/22 0500 07/03/22 [id number]  BUN '22 16 22  '$ CREATININE 1.32* 0.78 1.12*  -Initially Holding IVF but will start NS at 75 mL/hr x12 short term  -Strict I's and O's and Daily Weights  -Patient has a slight metabolic acidosis with CO2 of 21, anion gap of 7, chloride level 103 -Avoid Nephrotoxic Medications, Contrast Dyes, Hypotension and Dehydration to Ensure Adequate Renal Perfusion and will need to  Renally Adjust Meds -Continue to Monitor and Trend Renal Function carefully and repeat CMP in the AM   Electrolyte Abnormalities of Hyponatremia, Hypokalemia, Hypocalciemia  Electrolyte Trend Recent Labs  Lab 07/02/22 1835 07/02/22 1835 07/03/22 0500 07/03/22 0826  NA 134*  --  122* 131*  K 3.6  --  2.7* 3.4*  CO2 23  --  17* 21*  BUN 22  --  16 22  CREATININE 1.32*  --  0.78 1.12*  GLUCOSE 130*  --  94 107*  MG  --    < > 1.5* 2.1  CALCIUM 8.5*  --  6.1* 8.0*  PHOS  --   --   --  3.3   < > = values in this interval not displayed.  -Continue to Monitor and Replete Electrolytes as Necessary and repeating with p.o. KCl 40 mill colons twice daily x 2 -Continue to Monitor and Repeat CMP in the AM  Normocytic Anemia -Hgb/Hct Trend Recent Labs  Lab 07/02/22 1835 07/03/22 0500  HGB 12.7 9.3*  HCT 41.1 31.8*  MCV 88.8 94.1  -Check anemia panel in a.m. Continue monitor for signs and symptoms of bleeding; no overt bleeding noted Repeat CBC in a.m.  GERD/GI prophylaxis/moderate sized hiatal hernia -Continue substitution of her home omeprazole and continue with pantoprazole 40 g p.o. daily  Acquired Hypothyroidism -She was initially made n.p.o. but will put her on a diet -She takes 50 mcg by mouth 1 time a day every 2 days as well as 25 mcg per mouth 1 time a day every 2 days  Hypoalbuminemia -Patient's Albumin Trend: Recent Labs  Lab 07/02/22 1835 07/03/22 0500 07/03/22 0826  ALBUMIN 2.6* 1.6* 2.2*  -Continue to Monitor and Trend and repeat CMP in the AM  Morbid Obesity Obstructive Sleep Apnea -Complicates overall prognosis and care -Estimated body mass index is 46.5 kg/m as calculated from the following:   Height as of 06/21/22: '5\' 8"'$  (1.727 m).   Weight as of 06/27/22: 138.7 kg.  -Weight Loss and Dietary Counseling given -C/w CPAP qHS  Physical Debility -Wheelchair bound and in a facility -Likely at her functional baseline so no indication to have PT/OT to  evaluate  Will continue to monitor the patient's clinical response to intervention and repeat blood work and imaging in the a.m.

## 2022-07-03 NOTE — Progress Notes (Signed)
Pharmacy Antibiotic Note  Madison [name] is a 82 y.o. female admitted on 07/02/2022 with septic hip versus cellulitis.  Pharmacy has been consulted for vancomycin dosing.  Plan: Vancomycin 2gm IV x 1 then '1750mg'$  q36h (AUC 519.4, Scr 1.32) Follow renal function and clinical course     Temp (24hrs), Avg:98.3 F (36.8 C), Min:98.1 F (36.7 C), Max:98.5 F (36.9 C)  Recent Labs  Lab 07/02/22 1835 07/02/22 2230  WBC 19.7*  --   CREATININE 1.32*  --   LATICACIDVEN  --  1.6    Estimated Creatinine Clearance: 49.5 mL/min (A) (by C-G formula based on SCr of 1.32 mg/dL (H)).    Allergies  Allergen Reactions   Keflex [Cephalexin] Diarrhea   Macrobid [Nitrofurantoin] Hives   Amoxicillin Rash    Antimicrobials this admission: 3/10 vanc >> 3/10 CTX >>  Dose adjustments this admission:   Microbiology results: 3/9 BCx:   Thank you for allowing pharmacy to be a part of this patient's care.  Dolly Rias RPh 07/03/2022, 4:20 AM

## 2022-07-03 NOTE — H&P (Signed)
History and Physical      Madison [name] [id number] DOB: 04-13-1941 DOA: 07/02/2022  PCP: Oswaldo Conroy, MD  Patient coming from: home   I have personally briefly reviewed patient's old medical records in [place]  Chief Complaint: Right hip erythema  HPI: Madison [name] is a 82 y.o. female with medical history significant for CKD 3 A-CYS with baseline creatinine range 1.2-1.4, obstructive sleep apnea on home nocturnal CPAP, cortical thyroidism, chronic diastolic heart failure, who is admitted to [facility] on 07/02/2022 with sepsis due to cellulitis over the right hip after presenting from home to [facility] ED complaining of right hip erythema.   The patient reports to 3 days of progressive erythema over the right hip associated with tenderness, increased warmth, swelling.  Denies any associated acute numbness or paresthesias.  Denies rash in any other location.  Also denies any associated subjective fever, chills, rigors, generalized myalgias.  No recent fall or trauma to the right lower extremity.  Not on any blood thinners.  Right hip erythema has not been associated with any drainage, including no purulent drainage.  Over the last 3 to 4 days, she is also noted some bilateral anterior chest wall pain worse with palpation, worse with rotational movement of the upper torso.  She reports that this is nonexertional, nonpleuritic.  Denies any associated shortness of breath, palpitations, diaphoresis, nausea, vomiting, dizziness, presyncope, or syncope.  No recent cough or hemoptysis.  Denies any recent headache, neck stiffness, cough, abdominal pain, nor any recent dysuria or gross hematuria.  She has a history of right total hip arthroplasty performed by orthopedic surgery at [facility].  Subsequent to that, [facility] has taken over as her primary orthopedic surgical group.  She also has a history of infected right hip prosthesis, prompting removal of the right hip  prosthesis, without incident and replacement of prosthesis.     ED Course:  Vital signs in the ED were notable for the following: Afebrile; heart rate 81-93; stop blood pressure 110s to 130s; respiratory rate 18-23, oxygen saturation 94 to 90% on room air.  Labs were notable for the following: CMP notable for bicarb of 23, creatinine 1.32 compared to most recent prior serum creatinine data point of 1.3 on July 2023, calcium, adjusted for mild hypoalbuminemia noted to be 9.7, albumin 2.6, otherwise, liver enzymes found to be within normal limits.  BNP 142.  High sensitive troponin I initially noted to be 7, with repeat value noted to be 8.  Lactic acid 1.6.  CRP 27.2 compared to most recent prior value 4.5 in February 2023, ESR 85 compared to most recent prior value of 13 June 2021.  CBC notable for white blood cell count 19,700 with 89% neutrophils, hemoglobin 12.7.  Blood cultures x 2 collected prior to initiation of IV antibiotics.  COVID-19, influenza, RSV PCR negative.  Per my interpretation, EKG in ED demonstrated the following: Sinus rhythm with heart rate 96, normal intervals, nonspecific T wave inversion in lead III, less than 1 mm ST depression in V2, and no evidence of ST elevation.  Imaging and additional notable ED work-up: CTA chest showed small bilateral pleural effusions with adjacent atelectasis in the lower lobes, but otherwise demonstrated no evidence of acute cardiopulmonary process, including no evidence of pulmonary embolism or any evidence of infiltrate, edema, or pneumothorax.  CT right hip, performed radiology read, showed evidence of subcutaneous fat stranding over the right hip consistent with cellulitis, without corresponding evidence of subcutaneous gas.  CT hip also showed a fluid collection in the lateral right subcutaneous fat extending medially towards the greater trochanter, however this potential fluid collection it was noted to extend laterally beyond the field of  view, rendering it unclear if it communicates with the joint space.  Subsequently, EDP has paged the patient's primary orthopedic surgery group, [facility], requesting formal consultation.   While in the ED, the following were administered: Fentanyl 50 mcg IV x 2 doses, morphine 4 mg IV x 1 dose, Rocephin.  Subsequently, the patient was admitted for further evaluation and management of sepsis due to cellulitis over the right hip.     Review of Systems: As per HPI otherwise 10 point review of systems negative.   Past Medical History:  Diagnosis Date   Benign hypertension with chronic kidney disease, stage III ([place])    Overview:  Last Assessment & Plan:  Usually the patient is hypertensive. However today her blood pressure is 123456 systolic. She has been feeling fatigued with some lightheadedness. Part of this may be related to her lower blood pressure. Her pressure may be improved with her decrease in salt and fluid intake. I've instructed her to put her lisinopril/hctz on hold. 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 It was possibly reentrant. She does well with diltiazem. No change in therapy.   SVT (supraventricular  tachycardia)    Documented episode in the past, possibly reentrant tachycardia   Thyroid disease    Urinary incontinence     Past Surgical History:  Procedure Laterality Date   CATARACT EXTRACTION Left 8/11   CATARACT EXTRACTION Right 3/12   CHOLECYSTECTOMY  1994   Copsilotomy Laser Treatment Left 6/12   eye   ELBOW SURGERY  8/12   EYE SURGERY Left 09/2008   macular hole   EYE SURGERY Right 12/11   Lumbar Infusion  2011   MOLE REMOVAL  06/17/2015   TOTAL KNEE ARTHROPLASTY Left 6/11   TOTAL KNEE ARTHROPLASTY Right 06/13/2011    Social History:  reports that she has quit smoking. She has never used smokeless tobacco. She reports that she does not drink alcohol and does not use drugs.   Allergies  Allergen Reactions   Keflex [Cephalexin] Diarrhea   Macrobid [Nitrofurantoin] Hives   Amoxicillin Rash    Family History  Problem Relation Age of Onset   High blood pressure Mother    Diabetes Mother    Heart Problems Father    Diabetes Father    Prostate cancer Father    Heart attack Father    Heart attack Paternal Grandmother    Heart attack Maternal Grandfather    Heart attack Paternal  Uncle    Stroke Neg Hx     Family history reviewed and not pertinent    Prior to Admission medications   Medication Sig Start Date End Date Taking? Authorizing Provider  acetaminophen (TYLENOL) 325 MG tablet Take 650 mg by mouth every 6 (six) hours as needed.    [provider]  albuterol (VENTOLIN HFA) 108 (90 Base) MCG/ACT inhaler Inhale 2 puffs into the lungs every 6 (six) hours as needed for wheezing or shortness of breath.    [provider]  bifidobacterium infantis (ALIGN) capsule Take 1 capsule by mouth daily.    [provider]  Calcium Carb-Cholecalciferol (CALCIUM PLUS VITAMIN D3 PO) Take 1 tablet by mouth daily. Vitamin D3 48mg + Calcium 600    [provider]  ferrous sulfate 325 (65 FE) MG EC tablet Take 325 mg by mouth. Once A Day on  Mon, Thu    [provider]  furosemide (LASIX) 40 MG tablet Take 40 mg by mouth 2 (two) times daily.    [provider]  gabapentin (NEURONTIN) 300 MG capsule Take 300 mg by mouth at bedtime. 03/02/20   [provider]  levothyroxine (SYNTHROID) 25 MCG tablet Take 50 mcg by mouth. one time a day every 2 day(s) 02/06/21   [provider]  levothyroxine (SYNTHROID) 25 MCG tablet Take 25 mcg by mouth. one time a day every 2 day(s)    [provider]  loperamide (IMODIUM) 2 MG capsule Take 2 mg by mouth every 4 (four) hours as needed. 02/04/21   [provider]  mineral oil-hydrophilic petrolatum (AQUAPHOR) ointment Apply 1 Application topically as needed for dry skin. Apply to (L) arm &Chest topically two times a day for Itching;Rash related to [place]   [provider]  nystatin (MYCOSTATIN/NYSTOP) powder Apply 1 application  topically in the morning, at noon, in the evening, and at bedtime. To genital areas and inner thigh    [provider]  omeprazole (PRILOSEC) 20 MG capsule Take 20 mg by mouth daily.    [provider]  ondansetron (ZOFRAN-ODT) 4 MG disintegrating tablet Take 4 mg by mouth every 8 (eight) hours as needed. 02/04/21   [provider]  potassium chloride SA (KLOR-CON) 20 MEQ tablet Take 20 mEq by mouth daily.    [provider]  Vitamin D, Ergocalciferol, 50 MCG (2000 UT) CAPS Take 2 capsules by mouth daily.    [provider]     Objective    Physical Exam: Vitals:   07/02/22 2340 07/03/22 0100 07/03/22 0306 07/03/22 0332  BP: (!) 127/47 (!) 128/51  (!) 138/53  Pulse: 86 84  81  Resp: (!) 29 (!) 28  (!) 21  Temp:   98.5 F (36.9 C)   TempSrc:   Oral   SpO2: 92% 92%  98%    General: appears to be stated age; alert, oriented Skin: warm, dry, erythema over the right hip associated tenderness, increased warmth, swelling, in the absence of any  associated drainage Head:  AT/[place] Mouth:  Oral mucosa membranes appear moist, normal dentition Neck: supple; trachea midline Heart:  RRR; did not appreciate any M/R/G Lungs: CTAB, did not appreciate any wheezes, rales, or rhonchi Abdomen: + BS; soft, ND, NT Vascular: 2+ pedal pulses b/l; 2+ radial pulses b/l Extremities: no peripheral edema, no muscle wasting Neuro: strength and sensation intact in upper and lower extremities b/l    Labs on Admission: I have personally  reviewed following labs and imaging studies  CBC: Recent Labs  Lab 07/02/22 1835  WBC 19.7*  NEUTROABS 17.5*  HGB 12.7  HCT 41.1  MCV 88.8  PLT [id number]*   Basic Metabolic Panel: Recent Labs  Lab 07/02/22 1835  NA 134*  K 3.6  CL 101  CO2 23  GLUCOSE 130*  BUN 22  CREATININE 1.32*  CALCIUM 8.5*   GFR: Estimated Creatinine Clearance: 49.5 mL/min (A) (by C-G formula based on SCr of 1.32 mg/dL (H)). Liver Function Tests: Recent Labs  Lab 07/02/22 1835  AST 31  ALT 23  ALKPHOS 92  BILITOT 0.6  PROT 7.2  ALBUMIN 2.6*   No results for input(s): "LIPASE", "AMYLASE" in the last 168 hours. No results for input(s): "AMMONIA" in the last 168 hours. Coagulation Profile: No results for input(s): "INR", "PROTIME" in the last 168 hours. Cardiac Enzymes: No results for input(s): "CKTOTAL", "CKMB", "CKMBINDEX", "TROPONINI" in the last 168 hours. BNP (last 3 results) No results for input(s): "PROBNP" in the last 8760 hours. HbA1C: No results for input(s): "HGBA1C" in the last 72 hours. CBG: No results for input(s): "GLUCAP" in the last 168 hours. Lipid Profile: No results for input(s): "CHOL", "HDL", "LDLCALC", "TRIG", "CHOLHDL", "LDLDIRECT" in the last 72 hours. Thyroid Function Tests: No results for input(s): "TSH", "T4TOTAL", "FREET4", "T3FREE", "THYROIDAB" in the last 72 hours. Anemia Panel: No results for input(s): "VITAMINB12", "FOLATE", "FERRITIN", "TIBC", "IRON", "RETICCTPCT" in the last 72  hours. Urine analysis:    Component Value Date/Time   COLORURINE STRAW (A) 10/24/2020 1620   APPEARANCEUR CLEAR 10/24/2020 1620   LABSPEC 1.005 10/24/2020 1620   PHURINE 7.0 10/24/2020 1620   GLUCOSEU NEGATIVE 10/24/2020 1620   HGBUR NEGATIVE 10/24/2020 1620   BILIRUBINUR NEGATIVE 10/24/2020 1620   KETONESUR NEGATIVE 10/24/2020 1620   PROTEINUR NEGATIVE 10/24/2020 1620   UROBILINOGEN 0.2 12/30/2009 1134   NITRITE NEGATIVE 10/24/2020 1620   LEUKOCYTESUR NEGATIVE 10/24/2020 1620    Radiological Exams on Admission: CT Hip Right Wo Contrast  Result Date: 07/02/2022 CLINICAL DATA:  Septic arthritis suspected. Hip x-ray done. Hip pain and redness in the right hip. EXAM: CT OF THE RIGHT HIP WITHOUT CONTRAST TECHNIQUE: Multidetector CT imaging of the right hip was performed according to the standard protocol. Multiplanar CT image reconstructions were also generated. RADIATION DOSE REDUCTION: This exam was performed according to the departmental dose-optimization program which includes automated exposure control, adjustment of the mA and/or kV according to patient size and/or use of iterative reconstruction technique. 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 Increased callus formation about the acetabulum with redemonstrated fragmentation along the medial aspect of the acetabulum. Unchanged fragmentation of the right greater trochanter. MR nondisplaced longitudinal fracture and osseous fragmentation about the proximal femoral diaphysis. The proximal end of the femoral diaphysis has shifted anteriorly compared to 06/08/2021. Unchanged mixed soft tissue and fluid components at the site of the prior femoral head and neck component compatible with granulation tissue. There may be a central fluid component/seroma though this is similar to decreased from 06/08/2021.  Ligaments Suboptimally assessed by CT. Muscles and Tendons Muscle atrophy of the gluteal muscles. Soft tissues Similar postsurgical scarring in the subcutaneous fat anterior to the right hip. Increased size of the thick-walled fluid collection in the lateral right subcutaneous fat extending medially to the greater trochanter fragmentation. This is not completely visualized on this exam and extends laterally beyond the  lateral aspect of the scan. Suggestion of adjacent stranding lateral to the fluid collection though this is largely excluded from the field-of-view. IMPRESSION: 1. 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 There is some interval healing change since 06/08/2021. 2. Unchanged granulation tissue and possible central seroma about the proximal end of the femoral diaphysis and acetabulum. 3. Increased size of the thick-walled fluid collection in the lateral right subcutaneous fat extending medially to the greater trochanter fragmentation. This is not completely visualized on this exam and extends laterally beyond the field-of-view. There is the suggestion of subcutaneous stranding lateral to the fluid collection. Clinical correlation is recommended to exclude cellulitis. If there is concern for abscess, consider further evaluation with ultrasound and/or fluid sampling. It is unclear if the large subcutaneous fluid collection communicates with the joint space. Electronically Signed   By: Placido Sou M.D.   On: 07/02/2022 23:43   DG Hip Unilat With Pelvis 2-3 Views Right  Result Date: 07/02/2022 CLINICAL DATA:  Hip pain with redness and swelling to the right hip EXAM: DG HIP (WITH OR WITHOUT PELVIS) 2-3V RIGHT COMPARISON:  CT of the right hip 06/08/2021 FINDINGS: Findings appear similar to CT 06/08/2021 given differences in technique. Changes from prior right hip arthroplasty with subsequent hardware removal. Fragmentation of the right greater trochanter. Cortical  thickening and sclerosis about the proximal femoral shaft and acetabulum. No definite acute fracture. The soft tissue thickening and fluid about the right hip was better demonstrated on CT. Contrast within the bladder IMPRESSION: No definite acute fracture. Similar changes about the right hip from right hip arthroplasty and subsequent heart were removal when compared with CT 06/08/2021. Electronically Signed   By: Placido Sou M.D.   On: 07/02/2022 22:01   CT Angio Chest PE W/Cm &/Or Wo Cm  Result Date: 07/02/2022 CLINICAL DATA:  Pulmonary embolism suspected.  High probability. EXAM: CT ANGIOGRAPHY CHEST WITH CONTRAST TECHNIQUE: Multidetector CT imaging of the chest was performed using the standard protocol during bolus administration of intravenous contrast. Multiplanar CT image reconstructions and MIPs were obtained to evaluate the vascular anatomy. RADIATION DOSE REDUCTION: This exam was performed according to the departmental dose-optimization program which includes automated exposure control, adjustment of the mA and/or kV according to patient size and/or use of iterative reconstruction technique. CONTRAST:  44m OMNIPAQUE IOHEXOL 350 MG/ML SOLN COMPARISON:  Portable chest today, portable chest 10/24/2020. FINDINGS: Cardiovascular: There is mild cardiomegaly and small pericardial effusion. There are scattered three-vessel coronary artery calcifications. The pulmonary veins are normal caliber. The pulmonary arteries are normal caliber and clear through the segmental divisions. Due to breathing motion, the subsegmental arterial bed is obscured and not evaluated. There is mild aortic atherosclerosis and tortuosity without aneurysm, stenosis or dissection. The great vessels are clear. Mediastinum/Nodes: There is a moderate-sized hiatal hernia with gastroesophageal reflux to the level of the aortic arch. The esophageal thickness is normal. The trachea and main bronchi are patent. There are minimal retained  secretions in the right main bronchus. There is no intrathoracic or axillary adenopathy. Axillary spaces are clear. Normal thyroid. Lungs/Pleura: There small symmetric bilateral layering pleural effusions and adjacent coarse atelectatic markings in the bilateral lower lobes. There is mild posterior atelectasis in the upper lung fields. Reticulated scarring noted both apices. No focal infiltrate or nodule is seen through the breathing motion. There is no pulmonary edema. Upper Abdomen: Status post cholecystectomy. No biliary dilatation. Moderate hepatic steatosis. No acute upper abdominal findings. Musculoskeletal: There is thoracic kyphosis,  osteopenia, spondylosis and multilevel degenerative discs, multilevel bridging enthesopathy. Bilateral acromiohumeral abutment consistent with chronic rotator cuff arthropathy and degenerative tears is also noted with bilateral supraspinatus fatty atrophy. No acute or other significant musculoskeletal findings. Review of the MIP images confirms the above findings. IMPRESSION: 1. No evidence of arterial dilatation through the segmental divisions. The subsegmental arterial bed is obscured by breathing motion. 2. Cardiomegaly with small pericardial effusion and three-vessel coronary atherosclerosis. 3. Small bilateral pleural effusions with adjacent atelectasis in the lower lobes. 4. Moderate-sized hiatal hernia with gastroesophageal reflux to the level of the aortic arch. Aspiration precautions may be indicated. 5. Aortic atherosclerosis. 6. Moderate hepatic steatosis. 7. Osteopenia, kyphosis and degenerative change. 8. 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 Aortic Atherosclerosis (ICD10-I70.0). Electronically Signed   By: Telford Nab M.D.   On: 07/02/2022 21:32   DG Chest Port 1 View  Result Date: 07/02/2022 CLINICAL DATA:  Sternal chest pain, worse with movement EXAM: PORTABLE CHEST 1 VIEW COMPARISON:  Chest  radiograph dated 10/24/2020 FINDINGS: Low lung volumes with bronchovascular crowding. Bibasilar and bilateral perihilar patchy opacities. 0w0 No pleural effusion or pneumothorax. The heart size and mediastinal contours are within normal limits. The visualized skeletal structures are unremarkable. IMPRESSION: 1. Low lung volumes with bronchovascular crowding. 2. Bibasilar and bilateral perihilar patchy opacities, which could be due to atelectasis or infection. Electronically Signed   By: Darrin Nipper M.D.   On: 07/02/2022 19:08      Assessment/Plan    Principal Problem:   Cellulitis of right hip Active Problems:   GERD (gastroesophageal reflux disease)   Obstructive sleep apnea   Stage 3a chronic kidney disease (CKD) (HCC)   Sepsis (HCC)   Acquired hypothyroidism   Atypical chest pain   Chronic diastolic CHF (congestive heart failure) ([employer])     #) Sepsis due to cellulitis over the right hip: dx on basis of 2 3 days of progressive right hip erythema associated with tenderness, increased warmth to the touch, swelling with imaging in the form of CT right hip demonstrating evidence of the subcutaneous fat stranding over the right hip consistent with cellulitis.  Denies any associated purulent discharge. No evidence of crepitus on exam or evidence of subcutaneous gas on imaging to suggest necrotizing fasciitis.  No evidence of bone demineralization on imaging to suggest underlying osteomyelitis, Although significant interval increase in CRP and ESR are noted relative to corresponding data points from February 2023. Of note, associated extremity is found to be neurovascularly intact.   CT right hip also showed potential fluid collection in the lateral right subcutaneous fat, although it is unclear if there is any drainable aspect of this fluid collection is also unclear if this fluid collection communicates with the joint space, as further conveyed above.  EDP has placed consult request with the  patient's outpatient orthopedic surgical group, [facility], with additional recommendations from the screw pending at the present time.  SIRS criteria met via presenting leukocytosis, tachycardia and tachypnea. Of note, in the absence of any evidence of end organ damage, including a non-elevated presenting LA of 1.6, pt's sepsis does not meet criteria to be considered severe in nature. Also, in the absence of LA level greater than or equal to 4.0, and in the absence of any associated hypotension refractory to IVF's, there are no indications for administration of a 30 mL/kg IVF bolus at this time.   presentation meets criteria to be considered of severe cellulitis due to associated  sepsis. Overall, in the setting of presenting severe non-purulent cellulitis, will there would otherwise be recommendations for initiation of IV Vanc and Cefepime per recommendation by antibiotic stewardship committee.  However, as the patient is already received Rocephin this evening, we will continue the cephalosporin for now, and add IV vancomycin.  However, will monitor closely in the ensuing clinical trend, with consideration for escalation of the patient's cephalosporin from 3rd to 4th generation if ensuing evidence of clinical decline.  Of note, as the patient's right hip reportedly no longer contains prosthesis, there does not appear to be an indication for initiation of cefepime from a antipseudomonal coverage standpoint at the present time.  Of note, blood cultures x 2 were collected prior to aforementioned initiation of Rocephin in the ED.  No evidence of additional underlying factious process at this time, including CTA chest which shows no evidence of infiltrate to suggest underlying pneumonia.  Will also check urinalysis to further assess.   Plan: Abx in form of Rocephin and IV vancomycin, as above. Follow for results of blood cultures x2 collected today. repeat CBC with diff in AM.  Prn IV fentanyl.  [facility] to  consult, as above.  Check INR, urinalysis.  Keeping n.p.o. for now, in case [facility] subsequently recommends surgical intervention.             #) Atypical chest pain: 3 to 4 days of bilateral anterior chest wall discomfort, worse with palpation, in addition to exacerbation with rotational movement of the upper torso, suggestive of musculoskeletal involvement, particularly after CTA chest showed no evidence of acute process outside of small bilateral pleural effusions, including no evidence of contributory infiltrate nor any evidence of pneumothorax nor any evidence of acute pulmonary embolism.  Pain has been nonexertional, and appears atypical for ACS, which is further rendered less likely in the absence of any corresponding troponin elevation, while presenting EKG shows no evidence of acute ischemic changes, including no evidence of ST elevation.  Plan: Prn IV fentanyl, as above.               #) Chronic diastolic heart failure: documented history of such, with most recent echocardiogram performed November 2021, which is notable for LVEF 60 to 65%, no focal wall motion rise, will also demonstrating grade 2 diastolic send, normal right ventricular systolic function, mild to moderately dilated left atrium, mild mitral regurgitation.. No clinical or radiographic evidence to suggest acutely decompensated heart failure at this time, nor any elevation in BNP. home diuretic regimen reportedly consists of the following: Lasix 40 mg p.o. twice daily.  Overall, the patient appears relatively euvolemic.  Will hold home Lasix for now in the context of current n.p.o. status, will closely monitor Rensing volume status as outlined below.  Plan: monitor strict I's & O's and daily weights. Repeat CMP in AM. Check serum mag level.  Hold home diuretic regimen for now in the setting of current n.p.o. status.              #) CKD Stage 3A: Documented history of such, with baseline  creatinine 1.2-1.4, with presenting creatinine consistent with this baseline.    Plan: Monitor strict I's and O's and daily weights.  Attempt to avoid nephrotoxic agents.  CMP/magnesium level in the AM.              #) GERD: documented h/o such; on omeprazole as outpatient.   Plan: In the setting of current n.p.o. status, will hold home omeprazole in favor of daily  IV Protonix.            #) Obstructive sleep apnea: Documented history of such, with patient reporting good compliance on home nocturnal CPAP.   Plan: I have placed respiratory therapy consultation for provision of scheduled nocturnal CPAP.              #) acquired hypothyroidism: documented h/o such, on Synthroid as outpatient.   Plan: Hold home Synthroid in the setting of current n.p.o. status.         DVT prophylaxis: SCD's   Code Status: Full code Family Communication: none Disposition Plan: Per Rounding Team Consults called: EDP has requested formal consultation from [facility] group, as further detailed above;  Admission status: Inpatient    I SPENT GREATER THAN 75  MINUTES IN CLINICAL CARE TIME/MEDICAL DECISION-MAKING IN COMPLETING THIS ADMISSION.     [place] DO [facility] [place]   07/03/2022, 4:53 AM

## 2022-07-03 NOTE — Progress Notes (Signed)
Pharmacy Antibiotic Note  BRITTANE KILLE is a 82 y.o. female admitted on 07/02/2022 with cellulitis.  Pharmacy has been consulted for Vanco dosing.  ID: cellulitis. No evidence on imaging to suggest necrotizing faciitis. CT right hip also showed potential fluid collection in the lateral right subcutaneous fat, although it is unclear if there is any drainable aspect. 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 Dose due tomorrow     Temp (24hrs), Avg:98.3 F (36.8 C), Min:98.1 F (36.7 C), Max:98.5 F (36.9 C)  Recent Labs  Lab 07/02/22 1835 07/02/22 2230 07/03/22 0500 07/03/22 0826  WBC 19.7*  --  11.9*  --   CREATININE 1.32*  --  0.78 1.12*  LATICACIDVEN  --  1.6  --   --     Estimated Creatinine Clearance: 58.3 mL/min (A) (by C-G formula based on SCr of 1.12 mg/dL (H)).    Allergies  Allergen Reactions   Keflex [Cephalexin] Diarrhea   Macrobid [Nitrofurantoin] Hives   Amoxicillin Rash    Arcadio Cope S. Alford Highland, PharmD, BCPS Clinical Staff Pharmacist [email or web address] [place] 07/03/2022 11:05 AM

## 2022-07-04 ENCOUNTER — Inpatient Hospital Stay (HOSPITAL_COMMUNITY): Payer: Medicare Other

## 2022-07-04 DIAGNOSIS — E039 Hypothyroidism, unspecified: Secondary | ICD-10-CM | POA: Diagnosis not present

## 2022-07-04 DIAGNOSIS — L03115 Cellulitis of right lower limb: Secondary | ICD-10-CM | POA: Diagnosis not present

## 2022-07-04 DIAGNOSIS — R0789 Other chest pain: Secondary | ICD-10-CM | POA: Diagnosis not present

## 2022-07-04 DIAGNOSIS — I5032 Chronic diastolic (congestive) heart failure: Secondary | ICD-10-CM | POA: Diagnosis not present

## 2022-07-04 LAB — VITAMIN B12: Vitamin B-12: 738 pg/mL (ref 180–914)

## 2022-07-04 LAB — URINALYSIS, COMPLETE (UACMP) WITH MICROSCOPIC
Bacteria, UA: NONE SEEN
Bilirubin Urine: NEGATIVE
Glucose, UA: NEGATIVE mg/dL
Hgb urine dipstick: NEGATIVE
Ketones, ur: NEGATIVE mg/dL
Leukocytes,Ua: NEGATIVE
Nitrite: NEGATIVE
Protein, ur: 30 mg/dL — AB
Specific Gravity, Urine: 1.025 (ref 1.005–1.030)
pH: 5 (ref 5.0–8.0)

## 2022-07-04 LAB — CBC WITH DIFFERENTIAL/PLATELET
Abs Immature Granulocytes: 0.14 10*3/uL — ABNORMAL HIGH (ref 0.00–0.07)
Basophils Absolute: 0.1 10*3/uL (ref 0.0–0.1)
Basophils Relative: 1 %
Eosinophils Absolute: 0.2 10*3/uL (ref 0.0–0.5)
Eosinophils Relative: 2 %
HCT: 36.4 % (ref 36.0–46.0)
Hemoglobin: 10.9 g/dL — ABNORMAL LOW (ref 12.0–15.0)
Immature Granulocytes: 1 %
Lymphocytes Relative: 8 %
Lymphs Abs: 1 10*3/uL (ref 0.7–4.0)
MCH: 27.3 pg (ref 26.0–34.0)
MCHC: 29.9 g/dL — ABNORMAL LOW (ref 30.0–36.0)
MCV: 91 fL (ref 80.0–100.0)
Monocytes Absolute: 1.2 10*3/uL — ABNORMAL HIGH (ref 0.1–1.0)
Monocytes Relative: 9 %
Neutro Abs: 10 10*3/uL — ABNORMAL HIGH (ref 1.7–7.7)
Neutrophils Relative %: 79 %
Platelets: 475 10*3/uL — ABNORMAL HIGH (ref 150–400)
RBC: 4 MIL/uL (ref 3.87–5.11)
RDW: 13.8 % (ref 11.5–15.5)
WBC: 12.6 10*3/uL — ABNORMAL HIGH (ref 4.0–10.5)
nRBC: 0 % (ref 0.0–0.2)

## 2022-07-04 LAB — PHOSPHORUS: Phosphorus: 3.6 mg/dL (ref 2.5–4.6)

## 2022-07-04 LAB — COMPREHENSIVE METABOLIC PANEL
ALT: 17 U/L (ref 0–44)
AST: 23 U/L (ref 15–41)
Albumin: 2.2 g/dL — ABNORMAL LOW (ref 3.5–5.0)
Alkaline Phosphatase: 71 U/L (ref 38–126)
Anion gap: 10 (ref 5–15)
BUN: 25 mg/dL — ABNORMAL HIGH (ref 8–23)
CO2: 20 mmol/L — ABNORMAL LOW (ref 22–32)
Calcium: 8.2 mg/dL — ABNORMAL LOW (ref 8.9–10.3)
Chloride: 104 mmol/L (ref 98–111)
Creatinine, Ser: 1.21 mg/dL — ABNORMAL HIGH (ref 0.44–1.00)
GFR, Estimated: 45 mL/min — ABNORMAL LOW (ref >60)
Glucose, Bld: 96 mg/dL (ref 70–99)
Potassium: 4.2 mmol/L (ref 3.5–5.1)
Sodium: 134 mmol/L — ABNORMAL LOW (ref 135–145)
Total Bilirubin: 0.6 mg/dL (ref 0.3–1.2)
Total Protein: 6.2 g/dL — ABNORMAL LOW (ref 6.5–8.1)

## 2022-07-04 LAB — FERRITIN: Ferritin: 578 ng/mL — ABNORMAL HIGH (ref 11–307)

## 2022-07-04 LAB — RETICULOCYTES
Immature Retic Fract: 28.4 % — ABNORMAL HIGH (ref 2.3–15.9)
RBC.: 4.08 MIL/uL (ref 3.87–5.11)
Retic Count, Absolute: 49.4 10*3/uL (ref 19.0–186.0)
Retic Ct Pct: 1.2 % (ref 0.4–3.1)

## 2022-07-04 LAB — IRON AND TIBC
Iron: 15 ug/dL — ABNORMAL LOW (ref 28–170)
Saturation Ratios: 10 % — ABNORMAL LOW (ref 10.4–31.8)
TIBC: 147 ug/dL — ABNORMAL LOW (ref 250–450)
UIBC: 132 ug/dL

## 2022-07-04 LAB — GLUCOSE, CAPILLARY: Glucose-Capillary: 96 mg/dL (ref 70–99)

## 2022-07-04 LAB — FOLATE: Folate: 11.3 ng/mL (ref >5.9)

## 2022-07-04 LAB — MAGNESIUM: Magnesium: 2.4 mg/dL (ref 1.7–2.4)

## 2022-07-04 MED ORDER — BUDESONIDE 0.25 MG/2ML IN SUSP
0.2500 mg | Freq: Two times a day (BID) | RESPIRATORY_TRACT | Status: DC
  Administered 2022-07-04 – 2022-07-07 (×6): 0.25 mg via RESPIRATORY_TRACT
  Filled 2022-07-04 (×6): qty 2

## 2022-07-04 MED ORDER — IPRATROPIUM BROMIDE 0.02 % IN SOLN
0.5000 mg | Freq: Four times a day (QID) | RESPIRATORY_TRACT | Status: DC
  Administered 2022-07-04 – 2022-07-07 (×13): 0.5 mg via RESPIRATORY_TRACT
  Filled 2022-07-04 (×13): qty 2.5

## 2022-07-04 MED ORDER — LEVALBUTEROL HCL 0.63 MG/3ML IN NEBU
0.6300 mg | INHALATION_SOLUTION | Freq: Four times a day (QID) | RESPIRATORY_TRACT | Status: DC
  Administered 2022-07-04 – 2022-07-07 (×13): 0.63 mg via RESPIRATORY_TRACT
  Filled 2022-07-04 (×13): qty 3

## 2022-07-04 MED ORDER — ARFORMOTEROL TARTRATE 15 MCG/2ML IN NEBU
15.0000 ug | INHALATION_SOLUTION | Freq: Two times a day (BID) | RESPIRATORY_TRACT | Status: DC
  Filled 2022-07-04: qty 2

## 2022-07-04 MED ORDER — LEVALBUTEROL HCL 0.63 MG/3ML IN NEBU: 0.6300 mg | INHALATION_SOLUTION | Freq: Four times a day (QID) | RESPIRATORY_TRACT | Status: DC

## 2022-07-04 MED ORDER — BUDESONIDE 0.25 MG/2ML IN SUSP: 0.2500 mg | Freq: Two times a day (BID) | RESPIRATORY_TRACT | Status: DC

## 2022-07-04 MED ORDER — SODIUM CHLORIDE 0.9 % IV SOLN
500.0000 mg | Freq: Every day | INTRAVENOUS | Status: DC
  Administered 2022-07-04 – 2022-07-05 (×2): 500 mg via INTRAVENOUS
  Filled 2022-07-04 (×3): qty 5

## 2022-07-04 MED ORDER — SODIUM CHLORIDE 0.9 % IV SOLN
2.0000 g | Freq: Two times a day (BID) | INTRAVENOUS | Status: DC
  Administered 2022-07-04 – 2022-07-06 (×6): 2 g via INTRAVENOUS
  Filled 2022-07-04 (×6): qty 12.5

## 2022-07-04 MED ORDER — ARFORMOTEROL TARTRATE 15 MCG/2ML IN NEBU
15.0000 ug | INHALATION_SOLUTION | Freq: Two times a day (BID) | RESPIRATORY_TRACT | Status: DC
  Administered 2022-07-04 – 2022-07-06 (×5): 15 ug via RESPIRATORY_TRACT
  Filled 2022-07-04 (×7): qty 2

## 2022-07-04 NOTE — TOC Progression Note (Addendum)
Transition of Care [facility]) - Progression Note    Patient Details  Name: Madison [name] MRN: [id number] Date of Birth: 09-23-1940  Transition of Care [facility]) CM/SW [place], RN Phone Number:[phone]  07/04/2022, 2:36 PM  Clinical Narrative:     Transition of Care (TOC) Screening Note   Patient Details  Name: Madison [name] Date of Birth: 1941/02/17   Transition of Care [facility]) CM/SW Contact:    Angelita Ingles, RN Phone Number: 07/04/2022, 2:36 PM  CM called [facility] spoke with Ludwig Clarks who confirms that patient is on bed hold for SNF at [facility].    Transition of Care Department [facility]) has reviewed patient and no TOC needs have been identified at this time. We will continue to monitor patient advancement through interdisciplinary progression rounds.           Expected Discharge Plan and Services                                               Social Determinants of Health (SDOH) Interventions SDOH Screenings   Food Insecurity: No Food Insecurity (07/03/2022)  Housing: Low Risk  (07/03/2022)  Transportation Needs: No Transportation Needs (07/03/2022)  Utilities: Not At Risk (07/03/2022)  Depression (PHQ2-9): Low Risk  (05/31/2022)  Tobacco Use: Medium Risk (07/03/2022)    Readmission Risk Interventions     No data to display

## 2022-07-04 NOTE — Progress Notes (Signed)
PT refused CPAP.  

## 2022-07-04 NOTE — Consult Note (Signed)
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)   Sepsis (Diagonal)   Acquired hypothyroidism   Atypical chest pain   Chronic diastolic CHF (congestive heart failure) (HCC)   Antibiotics:  Vancomycin 3/9-c Ceftriaxone 3/9-3/10, cefepime 3/11-c  Lines/Hardware: Bilateral kne arthroplasty   Assessment 81 Y O female with PMH as below including 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  3/9 CT Rt hip  possible central seroma about the proximal end of the femoral diaphysis and acetabulum Increased size of the thick-walled fluid collection in the lateral right subcutaneous fat extending medially to the greater trochanter fragmentation.  There is the suggestion of subcutaneous stranding lateral to the fluid collection. If there is concern for abscess, consider further evaluation with ultrasound and/or fluid sampling. It is unclear if the large subcutaneous fluid collection communicates with the joint space.   Per ortho, no orthopedic intervention recommended, recommended to continue bs abtx and monitor response, fu pending. IR aspiration deferred due  to not enough fluid to sample vs overlying skin erythematous reaction   Recommendations  Given report of improvement of overlying skin at RT hip, will continue current regimen with cefepime and switch Vancomycin to daptomycin for renal safety. Will also cover prior proprionibacteriun as well as PsA. DC azithromycin, unlikely to be PNA  Would have Orthu fu to see any new recommendations or worth IR aspiration of the fluid in rt hip   Monitor CPK, CBC and BMP D/w ID pharm D and primary   Following   Rest of the management as per the primary team. Please call with questions or concerns.  Thank you for the consult  Rosiland Oz, MD Infectious Disease Physician [facility] [address] [place], [place] [address] Phone: [phone]  Fax: [phone]  __________________________________________________________________________________________________________ HPI and Hospital Course: 31 Y O female with PMH as below who presented from SNF to ED with worsening progressive erythema in the rt hip with increasing warmth and swelling as well as chest pain. Denies any trauma, falls or drainage. Denies any fevers, chills, nausea, vomiting. Husband reports she was not feeling her self and having body pain for approx a week prior to presentation and also reports significant weight gain after being started on meds for ? Depression at SNF  At ED afebrile but tachycardic  and tachypneic Labs remarkable for AKI w cr 1.32, leukocytosis of 19. Imagings as below  03/10/2021 ID Note from careeverywhere  CT on 12/09/20 demonstrated diffuse osteolysis along the acetabulum and small bony fragment/fracture fragments along the medial acetabular wall. She underwent CT-guided aspiration of the hip on August 19 and cultures grew pansusceptible Pseudomonas aeruginosa. She was admitted to [facility] on September 6 and underwent removal of the  infected total hip with extended trochanteric osteotomy with placement of antibiotic beads. A couple colonies of a Propionibacterium were identified in 1 culture out of 3. Post op course was complicated by anemia, UGIB, AKI, CT scan the abdomen pelvis performed on September 28 noted a large fluid collection that was 7.5 x 12.4 cm lateral to the right hip. It was thought that this was postoperative and reactive in nature. She was discharged home on IV piperacillin tazobactam. She followed up with Dr. Gale Journey on October 18. She reported extensive diffuse skin rash that was pruritic. Zosyn was discontinued and her PICC line was removed on that date. She was converted to oral ciprofloxacin and doxycycline until she can see her surgeon Dr. Laurance Flatten.   ROS: General- Denies fever, chills, loss of appetite and loss of weight HEENT - Denies headache, blurry vision, neck pain, sinus pain Chest - Denies any SOB or cough CVS- Denies any dizziness/lightheadedness, syncopal attacks, palpitations Abdomen- Denies any nausea, vomiting, abdominal pain, hematochezia and diarrhea Neuro - Denies any weakness, numbness, tingling sensation Psych - Denies any changes in mood irritability or depressive symptoms GU- Denies any burning, dysuria, hematuria or increased frequency of urination Skin - denies any rashes/lesions MSK - Rt hip pain and tenderness+  Past Medical History:  Diagnosis Date   Benign hypertension with chronic kidney disease, stage III (HCC)    Overview:  Last Assessment & Plan:  Usually the patient is hypertensive. However today her blood pressure is 123456 systolic. She has been feeling fatigued with some lightheadedness. Part of this may be related to her lower blood pressure. Her pressure may be improved with her decrease in salt and fluid intake. I've instructed her to put her lisinopril/hctz on hold. I've asked her to see her primary    Cardiac disease 03/10/2014   Chronic diarrhea 07/27/2015   Edema  07/27/2015   Ejection fraction 2004   Normal, echo,    Gastroesophageal reflux disease    GERD (gastroesophageal reflux disease)    Heart disease 03/10/2014   Hypertension    Left bundle branch block 09/01/2020   Obesity    Obesity (BMI 30-39.9) 09/25/2017   OSA (obstructive sleep apnea)    mild with AHI 6.75 - on CPAP   Preop cardiovascular exam 11/2010   Cardiac clearance for knee surgery    Sleep apnea 03/10/2014   Supraventricular tachycardia    Documented episode in the past, possibly reentrant tachycardia  Overview:  Overview:  Documented episode in the past, 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 It was possibly reentrant. She does well with diltiazem. No change in therapy.   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   Thyroid disease    Urinary incontinence    Past Surgical History:  Procedure Laterality Date   CATARACT EXTRACTION Left 8/11   CATARACT EXTRACTION Right 3/12   CHOLECYSTECTOMY  1994   Copsilotomy Laser Treatment Left 6/12   eye  ELBOW SURGERY  8/12   EYE SURGERY Left 09/2008   macular hole   EYE SURGERY Right 12/11   Lumbar Infusion  2011   MOLE REMOVAL  06/17/2015   TOTAL KNEE ARTHROPLASTY Left 6/11   TOTAL KNEE ARTHROPLASTY Right 06/13/2011     Scheduled Meds:  arformoterol  15 mcg Nebulization BID   budesonide (PULMICORT) nebulizer solution  0.25 mg Nebulization BID   guaiFENesin  1,200 mg Oral BID   ipratropium  0.5 mg Nebulization Q6H   levalbuterol  0.63 mg Nebulization Q6H   pantoprazole  40 mg Oral Daily   Continuous Infusions:  azithromycin 500 mg (07/04/22 1427)   ceFEPime (MAXIPIME) IV 2 g (07/04/22 1606)   vancomycin     PRN Meds:.acetaminophen **OR** acetaminophen, fentaNYL (SUBLIMAZE) injection, melatonin, naLOXone (NARCAN)  injection, ondansetron (ZOFRAN)  IV  Allergies  Allergen Reactions   Keflex [Cephalexin] Diarrhea   Macrobid [Nitrofurantoin] Hives   Amoxicillin Rash   Social History   Socioeconomic History   Marital status: Married    Spouse name: Not on file   Number of children: Not on file   Years of education: Not on file   Highest education level: Not on file  Occupational History   Not on file  Tobacco Use   Smoking status: Former   Smokeless tobacco: Never  Vaping Use   Vaping Use: Never used  Substance and Sexual Activity   Alcohol use: No   Drug use: No   Sexual activity: Not on file  Other Topics Concern   Not on file  Social History Narrative   Not on file   Social Determinants of Health   Financial Resource Strain: Not on file  Food Insecurity: No Food Insecurity (07/03/2022)   Hunger Vital Sign    Worried About Running Out of Food in the Last Year: Never true    Ran Out of Food in the Last Year: Never true  Transportation Needs: No Transportation Needs (07/03/2022)   PRAPARE - Hydrologist (Medical): No    Lack of Transportation (Non-Medical): No  Physical Activity: Not on file  Stress: Not on file  Social Connections: Not on file  Intimate Partner Violence: Not At Risk (07/03/2022)   Humiliation, Afraid, Rape, and Kick questionnaire    Fear of Current or Ex-Partner: No    Emotionally Abused: No    Physically Abused: No    Sexually Abused: No   Family History  Problem Relation Age of Onset   High blood pressure Mother    Diabetes Mother    Heart Problems Father    Diabetes Father    Prostate cancer Father    Heart attack Father    Heart attack Paternal Grandmother    Heart attack Maternal Grandfather    Heart attack Paternal Uncle    Stroke Neg Hx     Vitals BP 128/66 (BP Location: Left Arm)   Pulse 73   Temp 98.2 F (36.8 C) (Oral)   Resp 20   Wt (!) 142 kg   SpO2 90%   BMI 47.60 kg/m    Physical Exam Constitutional: chronically ill appearing  elderly female lying in the bed, morbidly obese    Comments: Conjunctiva normal  Cardiovascular:     Rate and Rhythm: Normal rate and regular rhythm.     Heart sounds: s1s2  Pulmonary:     Effort: Pulmonary effort is normal on room air    Comments: Occasional bibasilar crackles  Abdominal:     Palpations: Abdomen is soft.     Tenderness: Nondistended and nontender  Musculoskeletal:        General: Right lateral hip with improving erythema, mild induration and swelling.  No crepitus or fluctuance.  No open wounds in the lateral and anterior side.  I cannot see the back due to her habitus  Skin:    Comments: No rashes  Neurological:     General: Awake, alert and oriented  Psychiatric:        Mood and Affect: Mood normal.   Pertinent Microbiology Results for orders placed or performed during the hospital encounter of 07/02/22  Resp panel by RT-PCR (RSV, Flu A&B, Covid) Anterior Nasal Swab     Status: None   Collection Time: 07/02/22  6:35 PM   Specimen: Anterior Nasal Swab  Result Value Ref Range Status   SARS Coronavirus 2 by RT PCR NEGATIVE NEGATIVE Final    Comment: (NOTE) SARS-CoV-2 target nucleic acids are NOT DETECTED.  The SARS-CoV-2 RNA is generally detectable in upper respiratory specimens during the acute phase of infection. The lowest concentration of SARS-CoV-2 viral copies this assay can detect is 138 copies/mL. A negative result does not preclude SARS-Cov-2 infection and should not be used as the sole basis for treatment or other patient management decisions. A negative result may occur with  improper specimen collection/handling, submission of specimen other than nasopharyngeal swab, presence of viral mutation(s) within the areas targeted by this assay, and inadequate number of viral copies(<138 copies/mL). A negative result must be combined with clinical observations, patient history, and epidemiological information. The expected result is  Negative.  Fact Sheet for Patients:  EntrepreneurPulse.com.au  Fact Sheet for Healthcare Providers:  IncredibleEmployment.be  This test is no t yet approved or cleared by the [place] FDA and  has been authorized for detection and/or diagnosis of SARS-CoV-2 by FDA under an Emergency Use Authorization (EUA). This EUA will remain  in effect (meaning this test can be used) for the duration of the COVID-19 declaration under Section 564(b)(1) of the Act, 21 U.S.C.section 360bbb-3(b)(1), unless the authorization is terminated  or revoked sooner.       Influenza A by PCR NEGATIVE NEGATIVE Final   Influenza B by PCR NEGATIVE NEGATIVE Final    Comment: (NOTE) The Xpert Xpress SARS-CoV-2/FLU/RSV plus assay is intended as an aid in the diagnosis of influenza from Nasopharyngeal swab specimens and should not be used as a sole basis for treatment. Nasal washings and aspirates are unacceptable for Xpert Xpress SARS-CoV-2/FLU/RSV testing.  Fact Sheet for Patients: EntrepreneurPulse.com.au  Fact Sheet for Healthcare Providers: IncredibleEmployment.be  This test is not yet approved or cleared by the [place] FDA and has been authorized for detection and/or diagnosis of SARS-CoV-2 by FDA under an Emergency Use Authorization (EUA). This EUA will remain in effect (meaning this test can be used) for the duration of the COVID-19 declaration under Section 564(b)(1) of the Act, 21 U.S.C. section 360bbb-3(b)(1), unless the authorization is terminated or revoked.     Resp Syncytial Virus by PCR NEGATIVE NEGATIVE Final    Comment: (NOTE) Fact Sheet for Patients: EntrepreneurPulse.com.au  Fact Sheet for Healthcare Providers: IncredibleEmployment.be  This test is not yet approved or cleared by the [place] FDA and has been authorized for detection and/or diagnosis of  SARS-CoV-2 by FDA under an Emergency Use Authorization (EUA). This EUA will remain in effect (meaning this test can be used) for the duration of the COVID-19 declaration  under Section 564(b)(1) of the Act, 21 U.S.C. section 360bbb-3(b)(1), unless the authorization is terminated or revoked.  Performed at [facility], [place] [address], [place], [place] [address]   Culture, blood (routine x 2)     Status: None (Preliminary result)   Collection Time: 07/02/22 10:00 PM   Specimen: BLOOD  Result Value Ref Range Status   Specimen Description   Final    BLOOD SITE NOT SPECIFIED Performed at [facility], [place] [address], [place], [place] [address]    Special Requests   Final    BOTTLES DRAWN AEROBIC AND ANAEROBIC Blood Culture results may not be optimal due to an inadequate volume of blood received in culture bottles Performed at [facility] [address] [place], [place] [address]    Culture   Final    NO GROWTH 2 DAYS Performed at [facility] [address], [place], [place] [address]    Report Status PENDING  Incomplete  Culture, blood (routine x 2)     Status: None (Preliminary result)   Collection Time: 07/02/22 10:15 PM   Specimen: BLOOD  Result Value Ref Range Status   Specimen Description   Final    BLOOD BLOOD RIGHT HAND Performed at [facility] [address] [place], [place] [address]    Special Requests   Final    BOTTLES DRAWN AEROBIC AND ANAEROBIC Blood Culture results may not be optimal due to an excessive volume of blood received in culture bottles Performed at [facility] [address] [place], [place] [address]    Culture   Final    NO GROWTH 2 DAYS Performed at [facility] [address] [place], [place] [address]    Report Status PENDING  Incomplete  MRSA Next Gen by PCR, Nasal     Status: None   Collection Time: 07/03/22  5:10 PM   Specimen: Nasal  Mucosa; Nasal Swab  Result Value Ref Range Status   MRSA by PCR Next Gen NOT DETECTED NOT DETECTED Final    Comment: (NOTE) The GeneXpert MRSA Assay (FDA approved for NASAL specimens only), is one component of a comprehensive MRSA colonization surveillance program. It is not intended to diagnose MRSA infection nor to guide or monitor treatment for MRSA infections. Test performance is not FDA approved in patients less than 47 years old. Performed at [facility], [place] [address], [place], [place] [address]    Pertinent Lab seen by me:    Latest Ref Rng & Units 07/05/2022    5:38 AM 07/04/2022    5:02 AM 07/03/2022    5:00 AM  CBC  WBC 4.0 - 10.5 K/uL 11.7  12.6  11.9   Hemoglobin 12.0 - 15.0 g/dL 11.4  10.9  9.3   Hematocrit 36.0 - 46.0 % 38.5  36.4  31.8   Platelets 150 - 400 K/uL 507  475  326       Latest Ref Rng & Units 07/05/2022    5:38 AM 07/04/2022    5:02 AM 07/03/2022    8:26 AM  CMP  Glucose 70 - 99 mg/dL 94  96  107   BUN 8 - 23 mg/dL '24  25  22   '$ Creatinine 0.44 - 1.00 mg/dL 1.14  1.21  1.12   Sodium 135 - 145 mmol/L 135  134  131   Potassium 3.5 - 5.1 mmol/L 3.7  4.2  3.4  Chloride 98 - 111 mmol/L 106  104  103   CO2 22 - 32 mmol/L '19  20  21   '$ Calcium 8.9 - 10.3 mg/dL 8.4  8.2  8.0   Total Protein 6.5 - 8.1 g/dL 6.3  6.2  6.4   Total Bilirubin 0.3 - 1.2 mg/dL 0.6  0.6  0.7   Alkaline Phos 38 - 126 U/L 66  71  88   AST 15 - 41 U/L '30  23  24   '$ ALT 0 - 44 U/L '21  17  19      '$ Pertinent Imagings/Other Imagings Plain films and CT images have been personally visualized and interpreted; radiology reports have been reviewed. Decision making incorporated into the Impression / Recommendations.  DG CHEST PORT 1 VIEW  Result Date: 07/04/2022 CLINICAL DATA:  Shortness of breath. EXAM: PORTABLE CHEST 1 VIEW COMPARISON:  07/03/2018 for FINDINGS: Stable cardiomediastinal contours. Low lung volumes. Unchanged left base atelectasis. No signs of pleural  effusion, interstitial edema or consolidation. IMPRESSION: No change in aeration to the lungs compared with previous exam. Electronically Signed   By: Kerby Moors M.D.   On: 07/04/2022 08:18   DG CHEST PORT 1 VIEW  Result Date: 07/03/2022 CLINICAL DATA:  Shortness of breath. EXAM: PORTABLE CHEST 1 VIEW COMPARISON:  07/02/2022 and prior studies FINDINGS: This is a low volume study. The cardiomediastinal silhouette is unchanged. Mild LEFT basilar opacities are noted. There is no evidence of pneumothorax or pleural effusion. No acute bony abnormalities are noted. IMPRESSION: Mild LEFT basilar opacities, favor atelectasis but infection/pneumonia is not excluded. Electronically Signed   By: Margarette Canada M.D.   On: 07/03/2022 16:30   CT Hip Right Wo Contrast  Result Date: 07/02/2022 CLINICAL DATA:  Septic arthritis suspected. Hip x-ray done. Hip pain and redness in the right hip. EXAM: CT OF THE RIGHT HIP WITHOUT CONTRAST TECHNIQUE: Multidetector CT imaging of the right hip was performed according to the standard protocol. Multiplanar CT image reconstructions were also generated. RADIATION DOSE REDUCTION: This exam was performed according to the departmental dose-optimization program which includes automated exposure control, adjustment of the mA and/or kV according to patient size and/or use of iterative reconstruction technique. 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 Increased callus formation about the acetabulum with redemonstrated fragmentation along the medial aspect of the acetabulum. Unchanged fragmentation of the right greater trochanter. MR nondisplaced longitudinal fracture and osseous fragmentation about the proximal femoral diaphysis. The proximal end of the femoral diaphysis has shifted anteriorly compared to 06/08/2021. Unchanged mixed soft tissue and  fluid components at the site of the prior femoral head and neck component compatible with granulation tissue. There may be a central fluid component/seroma though this is similar to decreased from 06/08/2021. Ligaments Suboptimally assessed by CT. Muscles and Tendons Muscle atrophy of the gluteal muscles. Soft tissues Similar postsurgical scarring in the subcutaneous fat anterior to the right hip. Increased size of the thick-walled fluid collection in the lateral right subcutaneous fat extending medially to the greater trochanter fragmentation. This is not completely visualized on this exam and extends laterally beyond the lateral aspect of the scan. Suggestion of adjacent stranding lateral to the fluid collection though this is largely excluded from the field-of-view. IMPRESSION: 1. 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 There is some interval healing change since 06/08/2021. 2. Unchanged granulation tissue and possible central seroma  about the proximal end of the femoral diaphysis and acetabulum. 3. Increased size of the thick-walled fluid collection in the lateral right subcutaneous fat extending medially to the greater trochanter fragmentation. This is not completely visualized on this exam and extends laterally beyond the field-of-view. There is the suggestion of subcutaneous stranding lateral to the fluid collection. Clinical correlation is recommended to exclude cellulitis. If there is concern for abscess, consider further evaluation with ultrasound and/or fluid sampling. It is unclear if the large subcutaneous fluid collection communicates with the joint space. Electronically Signed   By: Placido Sou M.D.   On: 07/02/2022 23:43   DG Hip Unilat With Pelvis 2-3 Views Right  Result Date: 07/02/2022 CLINICAL DATA:  Hip pain with redness and swelling to the right hip EXAM: DG HIP (WITH OR WITHOUT PELVIS) 2-3V RIGHT COMPARISON:  CT of the right hip 06/08/2021  FINDINGS: Findings appear similar to CT 06/08/2021 given differences in technique. Changes from prior right hip arthroplasty with subsequent hardware removal. Fragmentation of the right greater trochanter. Cortical thickening and sclerosis about the proximal femoral shaft and acetabulum. No definite acute fracture. The soft tissue thickening and fluid about the right hip was better demonstrated on CT. Contrast within the bladder IMPRESSION: No definite acute fracture. Similar changes about the right hip from right hip arthroplasty and subsequent heart were removal when compared with CT 06/08/2021. Electronically Signed   By: Placido Sou M.D.   On: 07/02/2022 22:01   CT Angio Chest PE W/Cm &/Or Wo Cm  Result Date: 07/02/2022 CLINICAL DATA:  Pulmonary embolism suspected.  High probability. EXAM: CT ANGIOGRAPHY CHEST WITH CONTRAST TECHNIQUE: Multidetector CT imaging of the chest was performed using the standard protocol during bolus administration of intravenous contrast. Multiplanar CT image reconstructions and MIPs were obtained to evaluate the vascular anatomy. RADIATION DOSE REDUCTION: This exam was performed according to the departmental dose-optimization program which includes automated exposure control, adjustment of the mA and/or kV according to patient size and/or use of iterative reconstruction technique. CONTRAST:  14m OMNIPAQUE IOHEXOL 350 MG/ML SOLN COMPARISON:  Portable chest today, portable chest 10/24/2020. FINDINGS: Cardiovascular: There is mild cardiomegaly and small pericardial effusion. There are scattered three-vessel coronary artery calcifications. The pulmonary veins are normal caliber. The pulmonary arteries are normal caliber and clear through the segmental divisions. Due to breathing motion, the subsegmental arterial bed is obscured and not evaluated. There is mild aortic atherosclerosis and tortuosity without aneurysm, stenosis or dissection. The great vessels are clear.  Mediastinum/Nodes: There is a moderate-sized hiatal hernia with gastroesophageal reflux to the level of the aortic arch. The esophageal thickness is normal. The trachea and main bronchi are patent. There are minimal retained secretions in the right main bronchus. There is no intrathoracic or axillary adenopathy. Axillary spaces are clear. Normal thyroid. Lungs/Pleura: There small symmetric bilateral layering pleural effusions and adjacent coarse atelectatic markings in the bilateral lower lobes. There is mild posterior atelectasis in the upper lung fields. Reticulated scarring noted both apices. No focal infiltrate or nodule is seen through the breathing motion. There is no pulmonary edema. Upper Abdomen: Status post cholecystectomy. No biliary dilatation. Moderate hepatic steatosis. No acute upper abdominal findings. Musculoskeletal: There is thoracic kyphosis, osteopenia, spondylosis and multilevel degenerative discs, multilevel bridging enthesopathy. Bilateral acromiohumeral abutment consistent with chronic rotator cuff arthropathy and degenerative tears is also noted with bilateral supraspinatus fatty atrophy. No acute or other significant musculoskeletal findings. Review of the MIP images confirms the above findings. IMPRESSION: 1. No evidence of  arterial dilatation through the segmental divisions. The subsegmental arterial bed is obscured by breathing motion. 2. Cardiomegaly with small pericardial effusion and three-vessel coronary atherosclerosis. 3. Small bilateral pleural effusions with adjacent atelectasis in the lower lobes. 4. Moderate-sized hiatal hernia with gastroesophageal reflux to the level of the aortic arch. Aspiration precautions may be indicated. 5. Aortic atherosclerosis. 6. Moderate hepatic steatosis. 7. Osteopenia, kyphosis and degenerative change. 8. 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 Aortic Atherosclerosis (ICD10-I70.0).  Electronically Signed   By: Telford Nab M.D.   On: 07/02/2022 21:32   DG Chest Port 1 View  Result Date: 07/02/2022 CLINICAL DATA:  Sternal chest pain, worse with movement EXAM: PORTABLE CHEST 1 VIEW COMPARISON:  Chest radiograph dated 10/24/2020 FINDINGS: Low lung volumes with bronchovascular crowding. Bibasilar and bilateral perihilar patchy opacities. 0w0 No pleural effusion or pneumothorax. The heart size and mediastinal contours are within normal limits. The visualized skeletal structures are unremarkable. IMPRESSION: 1. Low lung volumes with bronchovascular crowding. 2. Bibasilar and bilateral perihilar patchy opacities, which could be due to atelectasis or infection. Electronically Signed   By: Darrin Nipper M.D.   On: 07/02/2022 19:08     I spent 100  minutes for this patient encounter including review of prior medical records/discussing diagnostics and treatment plan with the patient/family/coordinate care with primary/other specialits with greater than 50% of time in face to face encounter.   Electronically signed by:   Rosiland Oz, MD Infectious Disease Physician [facility] Pager: [phone]

## 2022-07-04 NOTE — Progress Notes (Signed)
PROGRESS NOTE    Madison [name]  [id number] DOB: [birth year] DOA: 07/02/2022 PCP: Oswaldo Conroy, MD   Brief Narrative:  he patient is a morbidly obese 82 year old elderly Caucasian female with a past medical history significant for but not limited to CKD stage IIIa with a baseline creatinine ranging from 1.2-1.4, obstructive sleep apnea on nocturnal CPAP, hypothyroidism, chronic diastolic CHF as well as other comorbidities who presented with 3 days of worsening progressive erythema of the right hip associated tenderness, increased warmth and swelling.  She denies any acute numbness or paresthesias and denies any rash in that location had no recent fall or trauma and is not on any blood thinners.  The right hip erythema has not been associated with any drainage or any purulent discharges.  Also she has noted some bilateral anterior chest wall pain worse with palpation and worse with rotational movement in the upper torso which is nonexertional and nonpleuritic.  Denies any recent headache, neck stiffness, abdominal pain or recent dysuria or gross hematuria.  She has had a history of a right total hip arthroplasty performed by orthopedic surgery at [facility] and subsequent to that [facility] has taken over as primary Ortho Surgical group for her.  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  In the ED she was afebrile and blood pressures in the 110s to 130s with a respiratory rate of 18-23 and O2 saturation of 90 to 94% on room air.  Blood cultures x 2 were obtained and her CRP was elevated to 27.2 and WBC was elevated at 19.7.   She underwent a CTA of the chest PE protocol to evaluate for her chest pain and it shows small bilateral pleural effusions with adjacent atelectasis  in the upper lobes but there was no evidence of any acute cardiopulmonary processes, pulmonary embolus or evidence of infiltrate or pneumothorax.  Subsequently she underwent a CT of the hip which showed evidence of subcutaneous fat stranding over the right hip consistent with cellulitis without corresponding evidence of subcutaneous gas.  CT of the hip also showed a fluid collection the right lateral subcutaneous fat extending medially towards the greater trochanter however there is potential fluid collection was noted to extend laterally beyond the field-of-view rendering unclear if it communicates with the joint space.  EDP consulted orthopedic surgery and she was given IV fentanyl x 2 as well as initiate on antibiotics.  She was admitted for the evaluation and management for sepsis secondary to cellulitis over the right hip along with atypical chest pain.   **Patient was doing a little bit better today and thinks that the pain in her right hip is not as bad.  Continues to have some atypical chest pain that is positional.  Will escalate her antibiotics given her history of Pseudomonas.  Continuing to monitor her respiratory status carefully and her wheezing is improving.  Assessment and Plan:  Sepsis secondary to Cellulitis of the Right Hip -She has had 2 to 3 days of progressive right hip erythema associated tenderness, increased warmth to touch as well as swelling and CT of the right hip demonstrated "Redemonstrated changes from prior right hip arthroplasty with subsequent removal. There is some interval healing change since 06/08/2021. Unchanged granulation tissue and possible central seroma about the proximal end of  the femoral diaphysis and acetabulum. Increased size of the thick-walled fluid collection in the lateral right subcutaneous fat extending medially to the greater trochanter fragmentation. This is not completely visualized on this exam and extends laterally beyond the field-of-view. There is  the suggestion of subcutaneous stranding lateral to the fluid collection. Clinical correlation is recommended to exclude cellulitis. If there is concern for abscess, consider further evaluation with ultrasound and/or fluid sampling. It is unclear if the large subcutaneous fluid collection communicates with the joint space." -She met SIRS criteria on admission given that she had leukocytosis, tachycardia, tachypnea and a source of infection and did not appear to have any endorgan damage and did not meet severe sepsis criteria -She did not receive the 30 mL/kg IV fluid bolus given her history of heart failure and the presentation most likely is due to severe nonpurulent cellulitis -ESR was 27.2 -Cultures x 2 were obtained and she was placed on antibiotics with IV vancomycin and IV ceftriaxone for now; given her history of Pseudomonas infection we will escalate antibiotics and stop IV ceftriaxone and start her on IV cefepime -We have consulted ID for further evaluation recommendations as well and they will see the patient in the a.m. -WBC Trend: Recent Labs  Lab 07/02/22 1835 07/03/22 0500 07/04/22 0502  WBC 19.7* 11.9* 12.6*  -Orthopedic surgery (Dr. Alvan Dame) has been consulted for further evaluation and Formal consult note is pending but they feel that the exam and imaging is consistent with cellulitis and patient has a CT scan shows no significant changes from prior studies and recommending no surgical intervention currently indicated at this time and recommends observation on broad-spectrum antibiotics.  Dr. Ihor Gully does not know if there is enough obvious fluid to have IR put a drain in but they are recommending reducing her erythematous reaction prior to even considering drain placement -Continue with pain control and she is made n.p.o. for possible surgical intervention. Orthopedic Surgery has evaluated and recommending no acute surgical intervention needed at this time so she has been given a  diet -Monitor cultures and temperature curve   Atypical chest pain rule out ACS -She has had 3 to 4 days of bilateral anterior chest wall discomfort which is worse with palpation in addition to being exacerbated with rotational movement of the upper torso which is likely suggestive of musculoskeletal involvement -CTA of the chest showed no evidence of acute processes and did show cardiomegaly with small pericardial effusion and three-vessel coronary atherosclerosis as well as small bilateral pleural effusions with adjacent atelectasis in the lower lobes and aortic atherosclerosis -Pain was felt to be nonexertional and appeared atypical for ACS -EKG was nonacute and showed no ischemic changes and no evidence of ST elevation -Troponin went from 7 -> 8 -Continue to monitor on telemetry and pain is positionial    Wheezing, improving  -She had no evidence of arterial dilatation to the segmental divisions of the segmental arterial bed is obscured by breathing motion on CTA -CXR done and showed "Low lung volumes with bronchovascular crowding. Bibasilar and bilateral perihilar patchy opacities, which could be due to atelectasis or infection." -She was noted to have some wheezing -Will add Xopenex and Atrovent and supplemental oxygen via nasal cannula if necessary -We will add guaifenesin, flutter valve and incentive spirometer -Repeat CXR this AM done and showed "Stable cardiomediastinal contours. Low lung volumes. Unchanged left base atelectasis. No signs of pleural effusion, interstitial edema or consolidation."   Chronic Diastolic CHF -BNP was [id number] -Hold IVF even  the Cr slightly went up from CTA; CTA noted cardiomegaly with small pleural cardial effusion and three-vessel coronary atherosclerosis as well as small bilateral pleural effusions with adjacent atelectasis in the lower lobes -Strict I's and O's and Daily Weights  Intake/Output Summary (Last 24 hours) at 07/04/2022 1607 Last data filed at  07/04/2022 0507 Gross per 24 hour  Intake 1141.25 ml  Output 800 ml  Net 341.25 ml  -IV fluid hydration has now been discontinued -Continue to monitor for signs and symptoms of volume overload -Repeat chest x-ray in the a.m. and repeat chest x-ray this afternoon pending   CKD Stage 3a Metabolic Acidosis -BUN/Cr Trend: Recent Labs  Lab 07/02/22 1835 07/03/22 0500 07/03/22 0826 07/04/22 0502  BUN '22 16 22 '$ 25*  CREATININE 1.32* 0.78 1.12* 1.21*  -IV fluid hydration has now been discontinued -Strict I's and O's and Daily Weights  -Patient has a slight metabolic acidosis with CO2 of 21, anion gap of 7, chloride level 103 -Avoid Nephrotoxic Medications, Contrast Dyes, Hypotension and Dehydration to Ensure Adequate Renal Perfusion and will need to Renally Adjust Meds -Continue to Monitor and Trend Renal Function carefully and repeat CMP in the AM    Electrolyte Abnormalities of Hyponatremia, Hypokalemia, Hypocalciemia  Electrolyte Trend Recent Labs  Lab 07/02/22 1835 07/02/22 1835 07/03/22 0500 07/03/22 0500 07/03/22 0826 07/04/22 0502  NA 134*  --  122*  --  131* 134*  K 3.6  --  2.7*  --  3.4* 4.2  CO2 23  --  17*  --  21* 20*  MG  --    < > 1.5*  --  2.1 2.4  PHOS  --   --   --    < > 3.3 3.6  CALCIUM 8.5*  --  6.1*  --  8.0* 8.2*   < > = values in this interval not displayed.  -Continue to Monitor and Replete Electrolytes as Necessary and repeating with p.o. KCl 40 mill colons twice daily x 2 -Continue to Monitor and Repeat CMP in the AM   Normocytic Anemia -Hgb/Hct Trend Recent Labs  Lab 07/02/22 1835 07/03/22 0500 07/04/22 0502  HGB 12.7 9.3* 10.9*  HCT 41.1 31.8* 36.4  MCV 88.8 94.1 91.0  -Checked Anemia Panel and showed an iron level of 15, UIBC of 132, TIBC of 147, saturation ratios of 10%, ferritin level of 578, folate of 11.3, vitamin B12 738 -Continue monitor for signs and symptoms of bleeding; no overt bleeding noted -Repeat CBC in a.m.   GERD/GI  prophylaxis/moderate sized hiatal hernia -Continue substitution of her home omeprazole and continue with pantoprazole 40 g p.o. daily   Acquired Hypothyroidism -She was initially made n.p.o. but will put her on a diet -She takes 50 mcg by mouth 1 time a day every 2 days as well as 25 mcg per mouth 1 time a day every 2 days   Hypoalbuminemia -Patient's Albumin Trend: Recent Labs  Lab 07/02/22 1835 07/03/22 0500 07/03/22 0826 07/04/22 0502  ALBUMIN 2.6* 1.6* 2.2* 2.2*  -Continue to Monitor and Trend and repeat CMP in the AM   Morbid Obesity Obstructive Sleep Apnea -Complicates overall prognosis and care -Estimated body mass index is 46.49 kg/m as calculated from the following:   Height as of 06/21/22: '5\' 8"'$  (1.727 m).   Weight as of this encounter: 138.7 kg.  -Weight Loss and Dietary Counseling given -C/w CPAP qHS   Physical Debility -Wheelchair bound and in a facility -Likely at her functional baseline so  no indication to have PT/OT to evaluate  DVT prophylaxis: SCDs Start: 07/03/22 0406    Code Status: Full Code Family Communication: No family currently at bedside  Disposition Plan:  Level of care: Telemetry Status is: Inpatient Remains inpatient appropriate because: Needs further clinical improvement and clearance by the specialist and orthopedic surgery and now ID has been consulted.   Consultants:  Orthopedic surgery Infectious diseases  Procedures:  As delineated as above  Antimicrobials:  Anti-infectives (From admission, onward)    Start     Dose/Rate Route Frequency Ordered Stop   07/04/22 1600  vancomycin (VANCOREADY) IVPB 1750 mg/350 mL  Status:  Discontinued        1,750 mg 175 mL/hr over 120 Minutes Intravenous Every 36 hours 07/03/22 0422 07/03/22 1105   07/04/22 1600  vancomycin (VANCOREADY) IVPB 2000 mg/400 mL        2,000 mg 200 mL/hr over 120 Minutes Intravenous Every 36 hours 07/03/22 1105     07/04/22 1500  ceFEPIme (MAXIPIME) 2 g in sodium  chloride 0.9 % 100 mL IVPB        2 g 200 mL/hr over 30 Minutes Intravenous 2 times daily 07/04/22 1410     07/04/22 1230  azithromycin (ZITHROMAX) 500 mg in sodium chloride 0.9 % 250 mL IVPB        500 mg 250 mL/hr over 60 Minutes Intravenous Daily 07/04/22 1131     07/03/22 2000  cefTRIAXone (ROCEPHIN) 1 g in sodium chloride 0.9 % 100 mL IVPB  Status:  Discontinued        1 g 200 mL/hr over 30 Minutes Intravenous Every 24 hours 07/03/22 0408 07/04/22 1410   07/03/22 0415  vancomycin (VANCOREADY) IVPB 2000 mg/400 mL        2,000 mg 200 mL/hr over 120 Minutes Intravenous  Once 07/03/22 0414 07/03/22 0651   07/02/22 2245  cefTRIAXone (ROCEPHIN) 2 g in sodium chloride 0.9 % 100 mL IVPB        2 g 200 mL/hr over 30 Minutes Intravenous  Once 07/02/22 2230 07/03/22 0006       Subjective: Seen and examined at bedside and the patient was doing a little bit better today she states that her breathing and wheezing is improved. Pain in her right hip is not as bad.  Continues to have some exertional chest pain and states that whenever they set her up she gets pain.  No other concerns or complaints at this time.  Objective: Vitals:   07/04/22 0529 07/04/22 0555 07/04/22 1323 07/04/22 1440  BP:   (!) 134/45   Pulse:   73   Resp:   20   Temp:   97.7 F (36.5 C)   TempSrc:   Oral   SpO2:  98% 99% 100%  Weight: (!) 138.7 kg       Intake/Output Summary (Last 24 hours) at 07/04/2022 1603 Last data filed at 07/04/2022 0507 Gross per 24 hour  Intake 1141.25 ml  Output 800 ml  Net 341.25 ml   Filed Weights   07/04/22 0529  Weight: (!) 138.7 kg   Examination: Physical Exam:  Constitutional: Morbidly obese chronically ill-appearing elderly Caucasian female in no acute distress but does appear little uncomfortable Respiratory: Diminished to auscultation bilaterally with coarse breath sounds and some mild expiratory wheezing but no appreciable rales, rhonchi or crackles. Normal respiratory  effort and patient is not tachypenic. No accessory muscle use.  Unlabored breathing but is wearing supplemental oxygen via nasal cannula Cardiovascular:  RRR, no murmurs / rubs / gallops. S1 and S2 auscultated. No extremity edema.  Abdomen: Soft, non-tender, distended secondary to body habitus. Bowel sounds positive.  GU: Deferred. Musculoskeletal: No clubbing / cyanosis of digits/nails.  Right hip is swollen and erythematous Skin: No appreciable rashes noted on limited skin evaluation Neurologic: CN 2-12 grossly intact with no focal deficits. Romberg sign and cerebellar reflexes not assessed.  Psychiatric: Normal judgment and insight. Alert and oriented x 3. Normal mood and appropriate affect.   Data Reviewed: I have personally reviewed following labs and imaging studies  CBC: Recent Labs  Lab 07/02/22 1835 07/03/22 0500 07/04/22 0502  WBC 19.7* 11.9* 12.6*  NEUTROABS 17.5* 10.4* 10.0*  HGB 12.7 9.3* 10.9*  HCT 41.1 31.8* 36.4  MCV 88.8 94.1 91.0  PLT 455* 326 [id number]*   Basic Metabolic Panel: Recent Labs  Lab 07/02/22 1835 07/03/22 0500 07/03/22 0826 07/04/22 0502  NA 134* 122* 131* 134*  K 3.6 2.7* 3.4* 4.2  CL 101 89* 103 104  CO2 23 17* 21* 20*  GLUCOSE 130* 94 107* 96  BUN '22 16 22 '$ 25*  CREATININE 1.32* 0.78 1.12* 1.21*  CALCIUM 8.5* 6.1* 8.0* 8.2*  MG  --  1.5* 2.1 2.4  PHOS  --   --  3.3 3.6   GFR: Estimated Creatinine Clearance: 54 mL/min (A) (by C-G formula based on SCr of 1.21 mg/dL (H)). Liver Function Tests: Recent Labs  Lab 07/02/22 1835 07/03/22 0500 07/03/22 0826 07/04/22 0502  AST '31 17 24 23  '$ ALT '23 13 19 17  '$ ALKPHOS 92 58 88 71  BILITOT 0.6 5.2* 0.7 0.6  PROT 7.2 4.6* 6.4* 6.2*  ALBUMIN 2.6* 1.6* 2.2* 2.2*   No results for input(s): "LIPASE", "AMYLASE" in the last 168 hours. No results for input(s): "AMMONIA" in the last 168 hours. Coagulation Profile: Recent Labs  Lab 07/03/22 0500  INR 1.6*   Cardiac Enzymes: No results for input(s):  "CKTOTAL", "CKMB", "CKMBINDEX", "TROPONINI" in the last 168 hours. BNP (last 3 results) No results for input(s): "PROBNP" in the last 8760 hours. HbA1C: No results for input(s): "HGBA1C" in the last 72 hours. CBG: Recent Labs  Lab 07/04/22 1320  GLUCAP 96   Lipid Profile: No results for input(s): "CHOL", "HDL", "LDLCALC", "TRIG", "CHOLHDL", "LDLDIRECT" in the last 72 hours. Thyroid Function Tests: No results for input(s): "TSH", "T4TOTAL", "FREET4", "T3FREE", "THYROIDAB" in the last 72 hours. Anemia Panel: Recent Labs    07/04/22 0502  VITAMINB12 738  FOLATE 11.3  FERRITIN 578*  TIBC 147*  IRON 15*  RETICCTPCT 1.2   Sepsis Labs: Recent Labs  Lab 07/02/22 2230  LATICACIDVEN 1.6    Recent Results (from the past 240 hour(s))  Resp panel by RT-PCR (RSV, Flu A&B, Covid) Anterior Nasal Swab     Status: None   Collection Time: 07/02/22  6:35 PM   Specimen: Anterior Nasal Swab  Result Value Ref Range Status   SARS Coronavirus 2 by RT PCR NEGATIVE NEGATIVE Final    Comment: (NOTE) SARS-CoV-2 target nucleic acids are NOT DETECTED.  The SARS-CoV-2 RNA is generally detectable in upper respiratory specimens during the acute phase of infection. The lowest concentration of SARS-CoV-2 viral copies this assay can detect is 138 copies/mL. A negative result does not preclude SARS-Cov-2 infection and should not be used as the sole basis for treatment or other patient management decisions. A negative result may occur with  improper specimen collection/handling, submission of specimen other than nasopharyngeal swab, presence of  viral mutation(s) within the areas targeted by this assay, and inadequate number of viral copies(<138 copies/mL). A negative result must be combined with clinical observations, patient history, and epidemiological information. The expected result is Negative.  Fact Sheet for Patients:  EntrepreneurPulse.com.au  Fact Sheet for Healthcare  Providers:  IncredibleEmployment.be  This test is no t yet approved or cleared by the [place] FDA and  has been authorized for detection and/or diagnosis of SARS-CoV-2 by FDA under an Emergency Use Authorization (EUA). This EUA will remain  in effect (meaning this test can be used) for the duration of the COVID-19 declaration under Section 564(b)(1) of the Act, 21 U.S.C.section 360bbb-3(b)(1), unless the authorization is terminated  or revoked sooner.       Influenza A by PCR NEGATIVE NEGATIVE Final   Influenza B by PCR NEGATIVE NEGATIVE Final    Comment: (NOTE) The Xpert Xpress SARS-CoV-2/FLU/RSV plus assay is intended as an aid in the diagnosis of influenza from Nasopharyngeal swab specimens and should not be used as a sole basis for treatment. Nasal washings and aspirates are unacceptable for Xpert Xpress SARS-CoV-2/FLU/RSV testing.  Fact Sheet for Patients: EntrepreneurPulse.com.au  Fact Sheet for Healthcare Providers: IncredibleEmployment.be  This test is not yet approved or cleared by the [place] FDA and has been authorized for detection and/or diagnosis of SARS-CoV-2 by FDA under an Emergency Use Authorization (EUA). This EUA will remain in effect (meaning this test can be used) for the duration of the COVID-19 declaration under Section 564(b)(1) of the Act, 21 U.S.C. section 360bbb-3(b)(1), unless the authorization is terminated or revoked.     Resp Syncytial Virus by PCR NEGATIVE NEGATIVE Final    Comment: (NOTE) Fact Sheet for Patients: EntrepreneurPulse.com.au  Fact Sheet for Healthcare Providers: IncredibleEmployment.be  This test is not yet approved or cleared by the [place] FDA and has been authorized for detection and/or diagnosis of SARS-CoV-2 by FDA under an Emergency Use Authorization (EUA). This EUA will remain in effect (meaning this test can be  used) for the duration of the COVID-19 declaration under Section 564(b)(1) of the Act, 21 U.S.C. section 360bbb-3(b)(1), unless the authorization is terminated or revoked.  Performed at [facility], [place] [address], [place], [place] [address]   Culture, blood (routine x 2)     Status: None (Preliminary result)   Collection Time: 07/02/22 10:00 PM   Specimen: BLOOD  Result Value Ref Range Status   Specimen Description   Final    BLOOD SITE NOT SPECIFIED Performed at [facility], [place] [address] [place], [place] [address]    Special Requests   Final    BOTTLES DRAWN AEROBIC AND ANAEROBIC Blood Culture results may not be optimal due to an inadequate volume of blood received in culture bottles Performed at [place] [address] [place], [place] [address]    Culture   Final    NO GROWTH 1 DAY Performed at [facility], [place] [address] [place], [place] [address]    Report Status PENDING  Incomplete  Culture, blood (routine x 2)     Status: None (Preliminary result)   Collection Time: 07/02/22 10:15 PM   Specimen: BLOOD  Result Value Ref Range Status   Specimen Description   Final    BLOOD BLOOD RIGHT HAND Performed at [place] [address], [place],  [address]    Special Requests   Final    BOTTLES DRAWN AEROBIC AND ANAEROBIC  Blood Culture results may not be optimal due to an excessive volume of blood received in culture bottles Performed at [place] [address], [place], [place] [address]    Culture   Final    NO GROWTH 1 DAY Performed at [facility], [place] [address] [place], [place] [address]    Report Status PENDING  Incomplete  MRSA Next Gen by PCR, Nasal     Status: None   Collection Time: 07/03/22  5:10 PM   Specimen: Nasal Mucosa; Nasal Swab  Result Value Ref Range Status   MRSA by PCR Next Gen NOT DETECTED NOT DETECTED Final    Comment:  (NOTE) The GeneXpert MRSA Assay (FDA approved for NASAL specimens only), is one component of a comprehensive MRSA colonization surveillance program. It is not intended to diagnose MRSA infection nor to guide or monitor treatment for MRSA infections. Test performance is not FDA approved in patients less than 50 years old. Performed at [facility], [place] [address] [place], [place] [address]     Radiology Studies: DG CHEST PORT 1 VIEW  Result Date: 07/04/2022 CLINICAL DATA:  Shortness of breath. EXAM: PORTABLE CHEST 1 VIEW COMPARISON:  07/03/2018 for FINDINGS: Stable cardiomediastinal contours. Low lung volumes. Unchanged left base atelectasis. No signs of pleural effusion, interstitial edema or consolidation. IMPRESSION: No change in aeration to the lungs compared with previous exam. Electronically Signed   By: Kerby Moors M.D.   On: 07/04/2022 08:18   DG CHEST PORT 1 VIEW  Result Date: 07/03/2022 CLINICAL DATA:  Shortness of breath. EXAM: PORTABLE CHEST 1 VIEW COMPARISON:  07/02/2022 and prior studies FINDINGS: This is a low volume study. The cardiomediastinal silhouette is unchanged. Mild LEFT basilar opacities are noted. There is no evidence of pneumothorax or pleural effusion. No acute bony abnormalities are noted. IMPRESSION: Mild LEFT basilar opacities, favor atelectasis but infection/pneumonia is not excluded. Electronically Signed   By: Margarette Canada M.D.   On: 07/03/2022 16:30   CT Hip Right Wo Contrast  Result Date: 07/02/2022 CLINICAL DATA:  Septic arthritis suspected. Hip x-ray done. Hip pain and redness in the right hip. EXAM: CT OF THE RIGHT HIP WITHOUT CONTRAST TECHNIQUE: Multidetector CT imaging of the right hip was performed according to the standard protocol. Multiplanar CT image reconstructions were also generated. RADIATION DOSE REDUCTION: This exam was performed according to the departmental dose-optimization program which includes automated exposure  control, adjustment of the mA and/or kV according to patient size and/or use of iterative reconstruction technique. 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 Increased callus formation about the acetabulum with redemonstrated fragmentation along the medial aspect of the acetabulum. Unchanged fragmentation of the right greater trochanter. MR nondisplaced longitudinal fracture and osseous fragmentation about the proximal femoral diaphysis. The proximal end of the femoral diaphysis has shifted anteriorly compared to 06/08/2021. Unchanged mixed soft tissue and fluid components at the site of the prior femoral head and neck component compatible with granulation tissue. There may be a central fluid component/seroma though this is similar to decreased from 06/08/2021. Ligaments Suboptimally assessed by CT. Muscles and Tendons Muscle atrophy of the gluteal muscles. Soft tissues Similar postsurgical scarring in the subcutaneous fat anterior to the right hip. Increased size of the thick-walled fluid collection in the lateral right subcutaneous fat extending medially to the greater trochanter fragmentation. This is not completely visualized on this  exam and extends laterally beyond the lateral aspect of the scan. Suggestion of adjacent stranding lateral to the fluid collection though this is largely excluded from the field-of-view. IMPRESSION: 1. 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 There is some interval healing change since 06/08/2021. 2. Unchanged granulation tissue and possible central seroma about the proximal end of the femoral diaphysis and acetabulum. 3. Increased size of the thick-walled fluid collection in the lateral right subcutaneous fat extending medially to the greater trochanter fragmentation. This is not completely visualized  on this exam and extends laterally beyond the field-of-view. There is the suggestion of subcutaneous stranding lateral to the fluid collection. Clinical correlation is recommended to exclude cellulitis. If there is concern for abscess, consider further evaluation with ultrasound and/or fluid sampling. It is unclear if the large subcutaneous fluid collection communicates with the joint space. Electronically Signed   By: Placido Sou M.D.   On: 07/02/2022 23:43   DG Hip Unilat With Pelvis 2-3 Views Right  Result Date: 07/02/2022 CLINICAL DATA:  Hip pain with redness and swelling to the right hip EXAM: DG HIP (WITH OR WITHOUT PELVIS) 2-3V RIGHT COMPARISON:  CT of the right hip 06/08/2021 FINDINGS: Findings appear similar to CT 06/08/2021 given differences in technique. Changes from prior right hip arthroplasty with subsequent hardware removal. Fragmentation of the right greater trochanter. Cortical thickening and sclerosis about the proximal femoral shaft and acetabulum. No definite acute fracture. The soft tissue thickening and fluid about the right hip was better demonstrated on CT. Contrast within the bladder IMPRESSION: No definite acute fracture. Similar changes about the right hip from right hip arthroplasty and subsequent heart were removal when compared with CT 06/08/2021. Electronically Signed   By: Placido Sou M.D.   On: 07/02/2022 22:01   CT Angio Chest PE W/Cm &/Or Wo Cm  Result Date: 07/02/2022 CLINICAL DATA:  Pulmonary embolism suspected.  High probability. EXAM: CT ANGIOGRAPHY CHEST WITH CONTRAST TECHNIQUE: Multidetector CT imaging of the chest was performed using the standard protocol during bolus administration of intravenous contrast. Multiplanar CT image reconstructions and MIPs were obtained to evaluate the vascular anatomy. RADIATION DOSE REDUCTION: This exam was performed according to the departmental dose-optimization program which includes automated exposure control, adjustment of  the mA and/or kV according to patient size and/or use of iterative reconstruction technique. CONTRAST:  34m OMNIPAQUE IOHEXOL 350 MG/ML SOLN COMPARISON:  Portable chest today, portable chest 10/24/2020. FINDINGS: Cardiovascular: There is mild cardiomegaly and small pericardial effusion. There are scattered three-vessel coronary artery calcifications. The pulmonary veins are normal caliber. The pulmonary arteries are normal caliber and clear through the segmental divisions. Due to breathing motion, the subsegmental arterial bed is obscured and not evaluated. There is mild aortic atherosclerosis and tortuosity without aneurysm, stenosis or dissection. The great vessels are clear. Mediastinum/Nodes: There is a moderate-sized hiatal hernia with gastroesophageal reflux to the level of the aortic arch. The esophageal thickness is normal. The trachea and main bronchi are patent. There are minimal retained secretions in the right main bronchus. There is no intrathoracic or axillary adenopathy. Axillary spaces are clear. Normal thyroid. Lungs/Pleura: There small symmetric bilateral layering pleural effusions and adjacent coarse atelectatic markings in the bilateral lower lobes. There is mild posterior atelectasis in the upper lung fields. Reticulated scarring noted both apices. No focal infiltrate or nodule is seen through the breathing motion. There is no pulmonary edema. Upper Abdomen: Status post cholecystectomy. No biliary dilatation. Moderate hepatic steatosis. No acute upper abdominal  findings. Musculoskeletal: There is thoracic kyphosis, osteopenia, spondylosis and multilevel degenerative discs, multilevel bridging enthesopathy. Bilateral acromiohumeral abutment consistent with chronic rotator cuff arthropathy and degenerative tears is also noted with bilateral supraspinatus fatty atrophy. No acute or other significant musculoskeletal findings. Review of the MIP images confirms the above findings. IMPRESSION: 1. No  evidence of arterial dilatation through the segmental divisions. The subsegmental arterial bed is obscured by breathing motion. 2. Cardiomegaly with small pericardial effusion and three-vessel coronary atherosclerosis. 3. Small bilateral pleural effusions with adjacent atelectasis in the lower lobes. 4. Moderate-sized hiatal hernia with gastroesophageal reflux to the level of the aortic arch. Aspiration precautions may be indicated. 5. Aortic atherosclerosis. 6. Moderate hepatic steatosis. 7. Osteopenia, kyphosis and degenerative change. 8. 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 Aortic Atherosclerosis (ICD10-I70.0). Electronically Signed   By: Telford Nab M.D.   On: 07/02/2022 21:32   DG Chest Port 1 View  Result Date: 07/02/2022 CLINICAL DATA:  Sternal chest pain, worse with movement EXAM: PORTABLE CHEST 1 VIEW COMPARISON:  Chest radiograph dated 10/24/2020 FINDINGS: Low lung volumes with bronchovascular crowding. Bibasilar and bilateral perihilar patchy opacities. 0w0 No pleural effusion or pneumothorax. The heart size and mediastinal contours are within normal limits. The visualized skeletal structures are unremarkable. IMPRESSION: 1. Low lung volumes with bronchovascular crowding. 2. Bibasilar and bilateral perihilar patchy opacities, which could be due to atelectasis or infection. Electronically Signed   By: Darrin Nipper M.D.   On: 07/02/2022 19:08    Scheduled Meds:  arformoterol  15 mcg Nebulization BID   budesonide (PULMICORT) nebulizer solution  0.25 mg Nebulization BID   guaiFENesin  1,200 mg Oral BID   ipratropium  0.5 mg Nebulization Q6H   levalbuterol  0.63 mg Nebulization Q6H   pantoprazole  40 mg Oral Daily   Continuous Infusions:  azithromycin 500 mg (07/04/22 1427)   ceFEPime (MAXIPIME) IV     vancomycin      LOS: 1 day   Raiford Noble, DO [facility] Available via Epic secure chat 7am-7pm After these hours,  please refer to coverage provider listed on [email or web address] 07/04/2022, 4:03 PM

## 2022-07-05 DIAGNOSIS — L03115 Cellulitis of right lower limb: Secondary | ICD-10-CM | POA: Diagnosis not present

## 2022-07-05 DIAGNOSIS — E039 Hypothyroidism, unspecified: Secondary | ICD-10-CM | POA: Diagnosis not present

## 2022-07-05 DIAGNOSIS — K2100 Gastro-esophageal reflux disease with esophagitis, without bleeding: Secondary | ICD-10-CM | POA: Diagnosis not present

## 2022-07-05 DIAGNOSIS — I5032 Chronic diastolic (congestive) heart failure: Secondary | ICD-10-CM | POA: Diagnosis not present

## 2022-07-05 LAB — CBC WITH DIFFERENTIAL/PLATELET
Abs Immature Granulocytes: 0.12 10*3/uL — ABNORMAL HIGH (ref 0.00–0.07)
Basophils Absolute: 0.1 10*3/uL (ref 0.0–0.1)
Basophils Relative: 0 %
Eosinophils Absolute: 0.2 10*3/uL (ref 0.0–0.5)
Eosinophils Relative: 2 %
HCT: 38.5 % (ref 36.0–46.0)
Hemoglobin: 11.4 g/dL — ABNORMAL LOW (ref 12.0–15.0)
Immature Granulocytes: 1 %
Lymphocytes Relative: 8 %
Lymphs Abs: 0.9 10*3/uL (ref 0.7–4.0)
MCH: 27.3 pg (ref 26.0–34.0)
MCHC: 29.6 g/dL — ABNORMAL LOW (ref 30.0–36.0)
MCV: 92.3 fL (ref 80.0–100.0)
Monocytes Absolute: 0.9 10*3/uL (ref 0.1–1.0)
Monocytes Relative: 8 %
Neutro Abs: 9.5 10*3/uL — ABNORMAL HIGH (ref 1.7–7.7)
Neutrophils Relative %: 81 %
Platelets: 507 10*3/uL — ABNORMAL HIGH (ref 150–400)
RBC: 4.17 MIL/uL (ref 3.87–5.11)
RDW: 14.1 % (ref 11.5–15.5)
WBC: 11.7 10*3/uL — ABNORMAL HIGH (ref 4.0–10.5)
nRBC: 0 % (ref 0.0–0.2)

## 2022-07-05 LAB — PHOSPHORUS: Phosphorus: 4.3 mg/dL (ref 2.5–4.6)

## 2022-07-05 LAB — COMPREHENSIVE METABOLIC PANEL
ALT: 21 U/L (ref 0–44)
AST: 30 U/L (ref 15–41)
Albumin: 2.2 g/dL — ABNORMAL LOW (ref 3.5–5.0)
Alkaline Phosphatase: 66 U/L (ref 38–126)
Anion gap: 10 (ref 5–15)
BUN: 24 mg/dL — ABNORMAL HIGH (ref 8–23)
CO2: 19 mmol/L — ABNORMAL LOW (ref 22–32)
Calcium: 8.4 mg/dL — ABNORMAL LOW (ref 8.9–10.3)
Chloride: 106 mmol/L (ref 98–111)
Creatinine, Ser: 1.14 mg/dL — ABNORMAL HIGH (ref 0.44–1.00)
GFR, Estimated: 48 mL/min — ABNORMAL LOW (ref >60)
Glucose, Bld: 94 mg/dL (ref 70–99)
Potassium: 3.7 mmol/L (ref 3.5–5.1)
Sodium: 135 mmol/L (ref 135–145)
Total Bilirubin: 0.6 mg/dL (ref 0.3–1.2)
Total Protein: 6.3 g/dL — ABNORMAL LOW (ref 6.5–8.1)

## 2022-07-05 LAB — MAGNESIUM: Magnesium: 2.4 mg/dL (ref 1.7–2.4)

## 2022-07-05 MED ORDER — LEVOTHYROXINE SODIUM 25 MCG PO TABS
25.0000 ug | ORAL_TABLET | ORAL | Status: DC
  Administered 2022-07-07: 25 ug via ORAL
  Filled 2022-07-05: qty 1

## 2022-07-05 MED ORDER — LEVOTHYROXINE SODIUM 50 MCG PO TABS
50.0000 ug | ORAL_TABLET | ORAL | Status: DC
  Administered 2022-07-06: 50 ug via ORAL
  Filled 2022-07-05: qty 1

## 2022-07-05 MED ORDER — SODIUM CHLORIDE 0.9 % IV SOLN
8.0000 mg/kg | Freq: Every day | INTRAVENOUS | Status: DC
  Administered 2022-07-05 – 2022-07-06 (×2): 750 mg via INTRAVENOUS
  Filled 2022-07-05 (×2): qty 15

## 2022-07-05 NOTE — Progress Notes (Signed)
Patient ID: Madison [name], female   DOB: [birth year], 82 y.o.   MRN: [id number] Subjective:       Patient reports pain as mild.  Upon evaluation this evening she does feel that she has noted improvement with the way that she feels in general as well as with regards to some of her right hip discomfort.  Objective:   VITALS:   Vitals:   07/05/22 1404 07/05/22 1448  BP:  (!) 140/59  Pulse:  75  Resp:  20  Temp:  98.5 F (36.9 C)  SpO2: 91% 100%    Exam: Examination of her right hip girdle this evening reveals what appears to be improvement with the overall erythematous change to her right hip girdle.  She still has erythema over the anterior lateral aspect of the hip that does appear to have decreased in size since I examined her on Sunday.  There is associated warmth over the erythematous area. Range of motion of right hip continues to be very limited.  LABS Recent Labs    07/03/22 0500 07/04/22 0502 07/05/22 0538  HGB 9.3* 10.9* 11.4*  HCT 31.8* 36.4 38.5  WBC 11.9* 12.6* 11.7*  PLT 326 475* 507*    Recent Labs    07/03/22 0826 07/04/22 0502 07/05/22 0538  NA 131* 134* 135  K 3.4* 4.2 3.7  BUN 22 25* 24*  CREATININE 1.12* 1.21* 1.14*  GLUCOSE 107* 96 94    Recent Labs    07/03/22 0500  INR 1.6*     Assessment/Plan: Assessment: 1.  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  Presentation of right hip cellulitic changes  Plan: Tonight I reviewed with her the slight improvement with her overall clinical picture as well as her exam findings.  I discussed with her and then through a phone call with her husband the condition of her right hip and the decision on whether or not to proceed with an interventional radiology guided aspiration of the fluid collection identified by CT scan.  The fluid collection is not  necessarily related to her joint space and may very well represent an old collection of blood or other fluid type.  At this point I am not certain that aspiration would provide substantial value or benefit in determining next steps for treatment.  She and her husband are not interested in proceeding with any surgical intervention. For this reason I would recommend that we continue treating her with IV antibiotics as directed through infectious disease for likely a 4 to 6-week interval to see if this would treat her cellulitis with recommendations thereafter to remain on oral antibiotics as suppression. I will continue to follow her course while she is in the hospital. No plans for surgical intervention at this point.

## 2022-07-05 NOTE — Progress Notes (Signed)
PT refused CPAP.  

## 2022-07-05 NOTE — Progress Notes (Signed)
PROGRESS NOTE    Madison [name]  [id number] DOB: [birth year] DOA: 07/02/2022 PCP: Oswaldo Conroy, MD   Brief Narrative:  The patient is a morbidly obese 82 year old elderly Caucasian female with a past medical history significant for but not limited to CKD stage IIIa with a baseline creatinine ranging from 1.2-1.4, obstructive sleep apnea on nocturnal CPAP, hypothyroidism, chronic diastolic CHF as well as other comorbidities who presented with 3 days of worsening progressive erythema of the right hip associated tenderness, increased warmth and swelling.  She denies any acute numbness or paresthesias and denies any rash in that location had no recent fall or trauma and is not on any blood thinners.  The right hip erythema has not been associated with any drainage or any purulent discharges.  Also she has noted some bilateral anterior chest wall pain worse with palpation and worse with rotational movement in the upper torso which is nonexertional and nonpleuritic.  Denies any recent headache, neck stiffness, abdominal pain or recent dysuria or gross hematuria.  She has had a history of a right total hip arthroplasty performed by orthopedic surgery at [facility] and subsequent to that [facility] has taken over as primary Ortho Surgical group for her.  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  In the ED she was afebrile and blood pressures in the 110s to 130s with a respiratory rate of 18-23 and O2 saturation of 90 to 94% on room air.  Blood cultures x 2 were obtained and her CRP was elevated to 27.2 and WBC was elevated at 19.7.   She underwent a CTA of the chest PE protocol to evaluate for her chest pain and it shows small bilateral pleural effusions with adjacent atelectasis  in the upper lobes but there was no evidence of any acute cardiopulmonary processes, pulmonary embolus or evidence of infiltrate or pneumothorax.  Subsequently she underwent a CT of the hip which showed evidence of subcutaneous fat stranding over the right hip consistent with cellulitis without corresponding evidence of subcutaneous gas.  CT of the hip also showed a fluid collection the right lateral subcutaneous fat extending medially towards the greater trochanter however there is potential fluid collection was noted to extend laterally beyond the field-of-view rendering unclear if it communicates with the joint space.  EDP consulted orthopedic surgery and she was given IV fentanyl x 2 as well as initiate on antibiotics.  She was admitted for the evaluation and management for sepsis secondary to cellulitis over the right hip along with atypical chest pain.    **Patient was doing a little bit better today and thinks that the pain in her right hip is not as bad.  Continues to have some atypical chest pain that is positional.  Will escalate her antibiotics given her history of Pseudomonas.  Continuing to monitor her respiratory status carefully and her wheezing is improving.  Given her history of infections we have consulted ID for further evaluation and continue IV cefepime and vancomycin for now.  ID recommends getting Ortho reinvolved to see if we can aspirate her joint will get IR to aspirate fluid and message has been sent and Ortho team to review let us know if we need to get IR to aspirate.   Assessment and Plan:  Sepsis secondary to  Cellulitis of the Right Hip -She has had 2 to 3 days of progressive right hip erythema associated tenderness, increased warmth to touch as well as swelling and CT of the right hip demonstrated "Redemonstrated changes from prior right hip arthroplasty with subsequent removal. There is some interval healing change since 06/08/2021. Unchanged granulation tissue and possible  central seroma about the proximal end of the femoral diaphysis and acetabulum. Increased size of the thick-walled fluid collection in the lateral right subcutaneous fat extending medially to the greater trochanter fragmentation. This is not completely visualized on this exam and extends laterally beyond the field-of-view. There is the suggestion of subcutaneous stranding lateral to the fluid collection. Clinical correlation is recommended to exclude cellulitis. If there is concern for abscess, consider further evaluation with ultrasound and/or fluid sampling. It is unclear if the large subcutaneous fluid collection communicates with the joint space." -She met SIRS criteria on admission given that she had leukocytosis, tachycardia, tachypnea and a source of infection and did not appear to have any endorgan damage and did not meet severe sepsis criteria -She did not receive the 30 mL/kg IV fluid bolus given her history of heart failure and the presentation most likely is due to severe nonpurulent cellulitis -ESR was 27.2 -Cultures x 2 were obtained and she was placed on antibiotics with IV vancomycin and IV ceftriaxone for now; given her history of Pseudomonas infection we will escalate antibiotics and stop IV ceftriaxone and start her on IV cefepime -We have consulted ID for further evaluation recommendations and are evaluating today and recommending having Ortho evaluate to see if she needs a joint aspiration -WBC Trend: Recent Labs  Lab 07/02/22 1835 07/03/22 0500 07/04/22 0502 07/05/22 0538  WBC 19.7* 11.9* 12.6* 11.7*  -Orthopedic surgery (Dr. Alvan Dame) has been consulted for further evaluation and Formal consult note is pending but they feel that the exam and imaging is consistent with cellulitis and patient has a CT scan shows no significant changes from prior studies and recommending no surgical intervention currently indicated at this time and recommends observation on broad-spectrum antibiotics.   Dr. Ihor Gully does not know if there is enough obvious fluid to have IR put a drain in but they are recommending reducing her erythematous reaction prior to even considering drain placement -Continue with pain control and she is made n.p.o. for possible surgical intervention. Orthopedic Surgery has evaluated and recommending no acute surgical intervention needed at this time so she has been given a diet and ID recommends reaching out to Ortho to get their opinion on whether she needed a joint aspiration and contacting IR if this is the case -Monitor cultures and temperature curve   Atypical chest pain rule out ACS -She has had 3 to 4 days of bilateral anterior chest wall discomfort which is worse with palpation in addition to being exacerbated with rotational movement of the upper torso which is likely suggestive of musculoskeletal involvement -CTA of the chest showed no evidence of acute processes and did show cardiomegaly with small pericardial effusion and three-vessel coronary atherosclerosis as well as small bilateral pleural effusions with adjacent atelectasis in the lower lobes and aortic atherosclerosis -Pain was felt to be nonexertional and appeared atypical for ACS -EKG was nonacute and showed no ischemic changes and no evidence of ST elevation -Troponin went from 7 -> 8 -Continue to monitor on telemetry and pain is positionial    Wheezing and acute respiratory failure with hypoxia, improving  -She had no evidence of arterial dilatation to  the segmental divisions of the segmental arterial bed is obscured by breathing motion on CTA -Initial CXR done and showed "Low lung volumes with bronchovascular crowding. Bibasilar and bilateral perihilar patchy opacities, which could be due to atelectasis or infection." SpO2: 100 % O2 Flow Rate (L/min): 3 L/min FiO2 (%): 21 % -She was noted to have some wheezing but this is improved -Will add Xopenex and Atrovent and supplemental oxygen via nasal cannula  to attempt to wean -We will add guaifenesin, flutter valve and incentive spirometer -Repeat CXR this AM done and showed "Stable cardiomediastinal contours. Low lung volumes. Unchanged left base atelectasis. No signs of pleural effusion, interstitial edema or consolidation."   Chronic Diastolic CHF -BNP was [id number] -Hold IVF even the Cr slightly went up from CTA; CTA noted cardiomegaly with small pleural cardial effusion and three-vessel coronary atherosclerosis as well as small bilateral pleural effusions with adjacent atelectasis in the lower lobes -Strict I's and O's and Daily Weights  Intake/Output Summary (Last 24 hours) at 07/05/2022 1814 Last data filed at 07/05/2022 1723 Gross per 24 hour  Intake 455 ml  Output 1550 ml  Net -1095 ml   -IV fluid hydration has now been discontinued -Continue to monitor for signs and symptoms of volume overload -Repeat chest x-ray in the a.m. and repeat chest x-ray done and showed "Stable cardiomediastinal contours. Low lung volumes. Unchanged left base atelectasis. No signs of pleural effusion, interstitial edema or consolidation."   CKD Stage 3a Metabolic Acidosis -BUN/Cr Trend: Recent Labs  Lab 07/02/22 1835 07/03/22 0500 07/03/22 0826 07/04/22 0502 07/05/22 0538  BUN '22 16 22 '$ 25* 24*  CREATININE 1.32* 0.78 1.12* 1.21* 1.14*  -IV fluid hydration has now been discontinued -Strict I's and O's and Daily Weights  -Patient has a slight metabolic acidosis with CO2 of 19, anion gap of 10, chloride level of 106 -Avoid Nephrotoxic Medications, Contrast Dyes, Hypotension and Dehydration to Ensure Adequate Renal Perfusion and will need to Renally Adjust Meds -Continue to Monitor and Trend Renal Function carefully and repeat CMP in the AM    Electrolyte Abnormalities of Hyponatremia, Hypokalemia, Hypocalciemia  Electrolyte Trend Recent Labs  Lab 07/02/22 1835 07/02/22 1835 07/03/22 0500 07/03/22 0500 07/03/22 0826 07/04/22 0502 07/05/22 0538  NA  134*  --  122*  --  131* 134* 135  K 3.6  --  2.7*  --  3.4* 4.2 3.7  CO2 23  --  17*  --  21* 20* 19*  MG  --    < > 1.5*  --  2.1 2.4 2.4  CALCIUM 8.5*  --  6.1*  --  8.0* 8.2* 8.4*  PHOS  --   --   --    < > 3.3 3.6 4.3   < > = values in this interval not displayed.  -Continue to Monitor and Replete Electrolytes as Necessary and repeating with p.o. KCl 40 mill colons twice daily x 2 -Continue to Monitor and Repeat CMP in the AM   Normocytic Anemia -Hgb/Hct Trend Recent Labs  Lab 07/02/22 1835 07/03/22 0500 07/04/22 0502 07/05/22 0538  HGB 12.7 9.3* 10.9* 11.4*  HCT 41.1 31.8* 36.4 38.5  MCV 88.8 94.1 91.0 92.3  -Checked Anemia Panel and showed an iron level of 15, UIBC of 132, TIBC of 147, saturation ratios of 10%, ferritin level of 578, folate of 11.3, vitamin B12 738 -Continue monitor for signs and symptoms of bleeding; no overt bleeding noted -Repeat CBC in a.m.   GERD/GI prophylaxis/moderate sized hiatal  hernia -Continue substitution of her home omeprazole and continue with pantoprazole 40 g p.o. daily   Acquired Hypothyroidism -She was initially made n.p.o. but will put her on a diet -She takes 50 mcg by mouth 1 time a day every 2 days as well as 25 mcg per mouth 1 time a day every 2 days   Hypoalbuminemia -Patient's Albumin Trend: Recent Labs  Lab 07/02/22 1835 07/03/22 0500 07/03/22 0826 07/04/22 0502 07/05/22 0538  ALBUMIN 2.6* 1.6* 2.2* 2.2* 2.2*  -Continue to Monitor and Trend and repeat CMP in the AM   Morbid Obesity Obstructive Sleep Apnea -Complicates overall prognosis and care -Estimated body mass index is 47.77 kg/m as calculated from the following:   Height as of this encounter: '5\' 8"'$  (1.727 m).   Weight as of this encounter: 142.5 kg.  -Weight Loss and Dietary Counseling given -C/w CPAP qHS   Physical Debility -Wheelchair bound and in a facility -Likely at her functional baseline so no indication to have PT/OT to evaluate  DVT prophylaxis:  SCDs Start: 07/03/22 0406    Code Status: Full Code Family Communication: No family currently at bedside  Disposition Plan:  Level of care: Telemetry Status is: Inpatient Remains inpatient appropriate because: Needs further clinical improvement and clearance by ID as well as orthopedic surgery   Consultants:  Orthopedic surgery Infectious diseases  Procedures:  As delineated as above  Antimicrobials:  Anti-infectives (From admission, onward)    Start     Dose/Rate Route Frequency Ordered Stop   07/05/22 2000  DAPTOmycin (CUBICIN) 750 mg in sodium chloride 0.9 % IVPB        8 mg/kg  95.1 kg (Adjusted) 130 mL/hr over 30 Minutes Intravenous Daily 07/05/22 1304     07/04/22 1600  vancomycin (VANCOREADY) IVPB 1750 mg/350 mL  Status:  Discontinued        1,750 mg 175 mL/hr over 120 Minutes Intravenous Every 36 hours 07/03/22 0422 07/03/22 1105   07/04/22 1600  vancomycin (VANCOREADY) IVPB 2000 mg/400 mL  Status:  Discontinued        2,000 mg 200 mL/hr over 120 Minutes Intravenous Every 36 hours 07/03/22 1105 07/05/22 1304   07/04/22 1500  ceFEPIme (MAXIPIME) 2 g in sodium chloride 0.9 % 100 mL IVPB        2 g 200 mL/hr over 30 Minutes Intravenous 2 times daily 07/04/22 1410     07/04/22 1230  azithromycin (ZITHROMAX) 500 mg in sodium chloride 0.9 % 250 mL IVPB        500 mg 250 mL/hr over 60 Minutes Intravenous Daily 07/04/22 1131     07/03/22 2000  cefTRIAXone (ROCEPHIN) 1 g in sodium chloride 0.9 % 100 mL IVPB  Status:  Discontinued        1 g 200 mL/hr over 30 Minutes Intravenous Every 24 hours 07/03/22 0408 07/04/22 1410   07/03/22 0415  vancomycin (VANCOREADY) IVPB 2000 mg/400 mL        2,000 mg 200 mL/hr over 120 Minutes Intravenous  Once 07/03/22 0414 07/03/22 0651   07/02/22 2245  cefTRIAXone (ROCEPHIN) 2 g in sodium chloride 0.9 % 100 mL IVPB        2 g 200 mL/hr over 30 Minutes Intravenous  Once 07/02/22 2230 07/03/22 0006       Subjective: Seen and examined at  bedside and she was doing okay and does not think her episodes were.  No nausea or vomiting.  Continues to have some mid chest and abdominal  discomfort especially when she coughs.  Wearing supplemental oxygen now.  Does not think her hip is as bad but it remains sore.  Denies any other concerns or complaints at this time.  Objective: Vitals:   07/05/22 0800 07/05/22 1338 07/05/22 1404 07/05/22 1448  BP:    (!) 140/59  Pulse:  70  75  Resp:  20  20  Temp:  98.2 F (36.8 C)  98.5 F (36.9 C)  TempSrc:  Oral  Oral  SpO2:   91% 100%  Weight: (!) 142 kg (!) 142.5 kg    Height:  '5\' 8"'$  (1.727 m)      Intake/Output Summary (Last 24 hours) at 07/05/2022 1749 Last data filed at 07/05/2022 1723 Gross per 24 hour  Intake 565.82 ml  Output 1550 ml  Net -984.18 ml   Filed Weights   07/05/22 0542 07/05/22 0800 07/05/22 1338  Weight: (!) 142.5 kg (!) 142 kg (!) 142.5 kg   Examination: Physical Exam:  Constitutional: WN/WD morbidly obese chronically ill-appearing elderly Caucasian female in no acute distress but remains uncomfortable again ly Respiratory: Diminished to auscultation bilaterally with some coarse breath sounds and has some slight expiratory wheezing but no appreciable rales, rhonchi or crackles.  Has a slightly increased respiratory effort and is wearing 3 L of supplemental oxygen via nasal cannula, Cardiovascular: RRR, no murmurs / rubs / gallops. S1 and S2 auscultated.  Mild 1+ extremity edema Abdomen: Soft, non-tender, distended secondary to body habitus. Bowel sounds positive.  GU: Deferred. Musculoskeletal: Right hip is swollen and erythematous Skin: No rashes, lesions, ulcers on a limited skin evaluation. No induration; Warm and dry.  Neurologic: CN 2-12 grossly intact with no focal deficits. Romberg sign cerebellar reflexes not assessed.  Psychiatric: Normal judgment and insight. Alert and oriented x 3. Normal mood and appropriate affect.   Data Reviewed: I have personally  reviewed following labs and imaging studies  CBC: Recent Labs  Lab 07/02/22 1835 07/03/22 0500 07/04/22 0502 07/05/22 0538  WBC 19.7* 11.9* 12.6* 11.7*  NEUTROABS 17.5* 10.4* 10.0* 9.5*  HGB 12.7 9.3* 10.9* 11.4*  HCT 41.1 31.8* 36.4 38.5  MCV 88.8 94.1 91.0 92.3  PLT 455* 326 475* 0000000*   Basic Metabolic Panel: Recent Labs  Lab 07/02/22 1835 07/03/22 0500 07/03/22 0826 07/04/22 0502 07/05/22 0538  NA 134* 122* 131* 134* 135  K 3.6 2.7* 3.4* 4.2 3.7  CL 101 89* 103 104 106  CO2 23 17* 21* 20* 19*  GLUCOSE 130* 94 107* 96 94  BUN '22 16 22 '$ 25* 24*  CREATININE 1.32* 0.78 1.12* 1.21* 1.14*  CALCIUM 8.5* 6.1* 8.0* 8.2* 8.4*  MG  --  1.5* 2.1 2.4 2.4  PHOS  --   --  3.3 3.6 4.3   GFR: Estimated Creatinine Clearance: 58.2 mL/min (A) (by C-G formula based on SCr of 1.14 mg/dL (H)). Liver Function Tests: Recent Labs  Lab 07/02/22 1835 07/03/22 0500 07/03/22 0826 07/04/22 0502 07/05/22 0538  AST '31 17 24 23 30  '$ ALT '23 13 19 17 21  '$ ALKPHOS 92 58 88 71 66  BILITOT 0.6 5.2* 0.7 0.6 0.6  PROT 7.2 4.6* 6.4* 6.2* 6.3*  ALBUMIN 2.6* 1.6* 2.2* 2.2* 2.2*   No results for input(s): "LIPASE", "AMYLASE" in the last 168 hours. No results for input(s): "AMMONIA" in the last 168 hours. Coagulation Profile: Recent Labs  Lab 07/03/22 0500  INR 1.6*   Cardiac Enzymes: No results for input(s): "CKTOTAL", "CKMB", "CKMBINDEX", "TROPONINI" in the last  168 hours. BNP (last 3 results) No results for input(s): "PROBNP" in the last 8760 hours. HbA1C: No results for input(s): "HGBA1C" in the last 72 hours. CBG: Recent Labs  Lab 07/04/22 1320  GLUCAP 96   Lipid Profile: No results for input(s): "CHOL", "HDL", "LDLCALC", "TRIG", "CHOLHDL", "LDLDIRECT" in the last 72 hours. Thyroid Function Tests: No results for input(s): "TSH", "T4TOTAL", "FREET4", "T3FREE", "THYROIDAB" in the last 72 hours. Anemia Panel: Recent Labs    07/04/22 0502  VITAMINB12 738  FOLATE 11.3  FERRITIN  578*  TIBC 147*  IRON 15*  RETICCTPCT 1.2   Sepsis Labs: Recent Labs  Lab 07/02/22 2230  LATICACIDVEN 1.6    Recent Results (from the past 240 hour(s))  Resp panel by RT-PCR (RSV, Flu A&B, Covid) Anterior Nasal Swab     Status: None   Collection Time: 07/02/22  6:35 PM   Specimen: Anterior Nasal Swab  Result Value Ref Range Status   SARS Coronavirus 2 by RT PCR NEGATIVE NEGATIVE Final    Comment: (NOTE) SARS-CoV-2 target nucleic acids are NOT DETECTED.  The SARS-CoV-2 RNA is generally detectable in upper respiratory specimens during the acute phase of infection. The lowest concentration of SARS-CoV-2 viral copies this assay can detect is 138 copies/mL. A negative result does not preclude SARS-Cov-2 infection and should not be used as the sole basis for treatment or other patient management decisions. A negative result may occur with  improper specimen collection/handling, submission of specimen other than nasopharyngeal swab, presence of viral mutation(s) within the areas targeted by this assay, and inadequate number of viral copies(<138 copies/mL). A negative result must be combined with clinical observations, patient history, and epidemiological information. The expected result is Negative.  Fact Sheet for Patients:  EntrepreneurPulse.com.au  Fact Sheet for Healthcare Providers:  IncredibleEmployment.be  This test is no t yet approved or cleared by the [place] FDA and  has been authorized for detection and/or diagnosis of SARS-CoV-2 by FDA under an Emergency Use Authorization (EUA). This EUA will remain  in effect (meaning this test can be used) for the duration of the COVID-19 declaration under Section 564(b)(1) of the Act, 21 U.S.C.section 360bbb-3(b)(1), unless the authorization is terminated  or revoked sooner.       Influenza A by PCR NEGATIVE NEGATIVE Final   Influenza B by PCR NEGATIVE NEGATIVE Final    Comment:  (NOTE) The Xpert Xpress SARS-CoV-2/FLU/RSV plus assay is intended as an aid in the diagnosis of influenza from Nasopharyngeal swab specimens and should not be used as a sole basis for treatment. Nasal washings and aspirates are unacceptable for Xpert Xpress SARS-CoV-2/FLU/RSV testing.  Fact Sheet for Patients: EntrepreneurPulse.com.au  Fact Sheet for Healthcare Providers: IncredibleEmployment.be  This test is not yet approved or cleared by the [place] FDA and has been authorized for detection and/or diagnosis of SARS-CoV-2 by FDA under an Emergency Use Authorization (EUA). This EUA will remain in effect (meaning this test can be used) for the duration of the COVID-19 declaration under Section 564(b)(1) of the Act, 21 U.S.C. section 360bbb-3(b)(1), unless the authorization is terminated or revoked.     Resp Syncytial Virus by PCR NEGATIVE NEGATIVE Final    Comment: (NOTE) Fact Sheet for Patients: EntrepreneurPulse.com.au  Fact Sheet for Healthcare Providers: IncredibleEmployment.be  This test is not yet approved or cleared by the [place] FDA and has been authorized for detection and/or diagnosis of SARS-CoV-2 by FDA under an Emergency Use Authorization (EUA). This EUA will remain in effect (meaning  this test can be used) for the duration of the COVID-19 declaration under Section 564(b)(1) of the Act, 21 U.S.C. section 360bbb-3(b)(1), unless the authorization is terminated or revoked.  Performed at [facility], [place] [address] [place], [place] [address]   Culture, blood (routine x 2)     Status: None (Preliminary result)   Collection Time: 07/02/22 10:00 PM   Specimen: BLOOD  Result Value Ref Range Status   Specimen Description   Final    BLOOD SITE NOT SPECIFIED Performed at [facility], [place] [address] [place], [place] [address]    Special Requests   Final     BOTTLES DRAWN AEROBIC AND ANAEROBIC Blood Culture results may not be optimal due to an inadequate volume of blood received in culture bottles Performed at [place] [address] [place], [place] [address]    Culture   Final    NO GROWTH 2 DAYS Performed at [place] [address], [place], [place] [address]    Report Status PENDING  Incomplete  Culture, blood (routine x 2)     Status: None (Preliminary result)   Collection Time: 07/02/22 10:15 PM   Specimen: BLOOD  Result Value Ref Range Status   Specimen Description   Final    BLOOD BLOOD RIGHT HAND Performed at [place] [address], [place], [place] [address]    Special Requests   Final    BOTTLES DRAWN AEROBIC AND ANAEROBIC Blood Culture results may not be optimal due to an excessive volume of blood received in culture bottles Performed at [facility] [address], [place], [place] [address]    Culture   Final    NO GROWTH 2 DAYS Performed at [facility] [address] [place], [place] [address]    Report Status PENDING  Incomplete  MRSA Next Gen by PCR, Nasal     Status: None   Collection Time: 07/03/22  5:10 PM   Specimen: Nasal Mucosa; Nasal Swab  Result Value Ref Range Status   MRSA by PCR Next Gen NOT DETECTED NOT DETECTED Final    Comment: (NOTE) The GeneXpert MRSA Assay (FDA approved for NASAL specimens only), is one component of a comprehensive MRSA colonization surveillance program. It is not intended to diagnose MRSA infection nor to guide or monitor treatment for MRSA infections. Test performance is not FDA approved in patients less than 51 years old. Performed at [facility], [place] [address], [place],  [address]     Radiology Studies: DG CHEST PORT 1 VIEW  Result Date: 07/04/2022 CLINICAL DATA:  Shortness of breath. EXAM: PORTABLE CHEST 1 VIEW COMPARISON:  07/03/2018 for FINDINGS: Stable  cardiomediastinal contours. Low lung volumes. Unchanged left base atelectasis. No signs of pleural effusion, interstitial edema or consolidation. IMPRESSION: No change in aeration to the lungs compared with previous exam. Electronically Signed   By: Kerby Moors M.D.   On: 07/04/2022 08:18     Scheduled Meds:  arformoterol  15 mcg Nebulization BID   budesonide (PULMICORT) nebulizer solution  0.25 mg Nebulization BID   guaiFENesin  1,200 mg Oral BID   ipratropium  0.5 mg Nebulization Q6H   levalbuterol  0.63 mg Nebulization Q6H   pantoprazole  40 mg Oral Daily   Continuous Infusions:  azithromycin 500 mg (07/05/22 1113)   ceFEPime (MAXIPIME) IV 2 g (07/05/22 0952)   DAPTOmycin (CUBICIN) 750 mg in sodium chloride  0.9 % IVPB      LOS: 2 days   Raiford Noble, DO [facility] Available via Epic secure chat 7am-7pm After these hours, please refer to coverage provider listed on [email or web address] 07/05/2022, 5:49 PM

## 2022-07-05 NOTE — Plan of Care (Signed)

## 2022-07-06 ENCOUNTER — Inpatient Hospital Stay (HOSPITAL_COMMUNITY): Payer: Medicare Other

## 2022-07-06 LAB — CBC WITH DIFFERENTIAL/PLATELET
Abs Immature Granulocytes: 0.14 10*3/uL — ABNORMAL HIGH (ref 0.00–0.07)
Basophils Absolute: 0.1 10*3/uL (ref 0.0–0.1)
Basophils Relative: 0 %
Eosinophils Absolute: 0.2 10*3/uL (ref 0.0–0.5)
Eosinophils Relative: 2 %
HCT: 37 % (ref 36.0–46.0)
Hemoglobin: 10.8 g/dL — ABNORMAL LOW (ref 12.0–15.0)
Immature Granulocytes: 1 %
Lymphocytes Relative: 7 %
Lymphs Abs: 0.8 10*3/uL (ref 0.7–4.0)
MCH: 26.8 pg (ref 26.0–34.0)
MCHC: 29.2 g/dL — ABNORMAL LOW (ref 30.0–36.0)
MCV: 91.8 fL (ref 80.0–100.0)
Monocytes Absolute: 1.1 10*3/uL — ABNORMAL HIGH (ref 0.1–1.0)
Monocytes Relative: 9 %
Neutro Abs: 9.5 10*3/uL — ABNORMAL HIGH (ref 1.7–7.7)
Neutrophils Relative %: 81 %
Platelets: 536 10*3/uL — ABNORMAL HIGH (ref 150–400)
RBC: 4.03 MIL/uL (ref 3.87–5.11)
RDW: 14.3 % (ref 11.5–15.5)
WBC: 11.9 10*3/uL — ABNORMAL HIGH (ref 4.0–10.5)
nRBC: 0 % (ref 0.0–0.2)

## 2022-07-06 LAB — COMPREHENSIVE METABOLIC PANEL
ALT: 25 U/L (ref 0–44)
AST: 36 U/L (ref 15–41)
Albumin: 2.3 g/dL — ABNORMAL LOW (ref 3.5–5.0)
Alkaline Phosphatase: 63 U/L (ref 38–126)
Anion gap: 8 (ref 5–15)
BUN: 20 mg/dL (ref 8–23)
CO2: 19 mmol/L — ABNORMAL LOW (ref 22–32)
Calcium: 8.4 mg/dL — ABNORMAL LOW (ref 8.9–10.3)
Chloride: 109 mmol/L (ref 98–111)
Creatinine, Ser: 1.01 mg/dL — ABNORMAL HIGH (ref 0.44–1.00)
GFR, Estimated: 56 mL/min — ABNORMAL LOW (ref >60)
Glucose, Bld: 106 mg/dL — ABNORMAL HIGH (ref 70–99)
Potassium: 3.4 mmol/L — ABNORMAL LOW (ref 3.5–5.1)
Sodium: 136 mmol/L (ref 135–145)
Total Bilirubin: 0.3 mg/dL (ref 0.3–1.2)
Total Protein: 6.4 g/dL — ABNORMAL LOW (ref 6.5–8.1)

## 2022-07-06 LAB — PHOSPHORUS: Phosphorus: 3.6 mg/dL (ref 2.5–4.6)

## 2022-07-06 LAB — CK: Total CK: 12 U/L — ABNORMAL LOW (ref 38–234)

## 2022-07-06 LAB — MAGNESIUM: Magnesium: 2.4 mg/dL (ref 1.7–2.4)

## 2022-07-06 MED ORDER — POTASSIUM CHLORIDE CRYS ER 20 MEQ PO TBCR
40.0000 meq | EXTENDED_RELEASE_TABLET | Freq: Once | ORAL | Status: AC
  Administered 2022-07-06: 40 meq via ORAL
  Filled 2022-07-06: qty 2

## 2022-07-06 NOTE — Progress Notes (Signed)
Progress Note    Madison [name]   [id number]  DOB: 05-Jun-1940  DOA: 07/02/2022     3 PCP: Oswaldo Conroy, MD  Initial CC: Right hip pain  Hospital Course: Madison [name] is an 82 yo female with PMH HTN, CKD 3, GERD, obesity, OSA, hypothyroidism, dCHF who presented with worsening erythema, tenderness, warmth, and swelling of her right hip. She has had a history of a right total hip arthroplasty performed by orthopedic surgery at [facility] and subsequent to that [facility] has taken over as primary Ortho Surgical group for her.  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  She underwent a CTA of the chest PE protocol to evaluate for her chest pain and it showed small bilateral pleural effusions with adjacent atelectasis in the upper lobes but there was no evidence of any acute cardiopulmonary processes, pulmonary embolus or evidence of infiltrate or pneumothorax.   Subsequently she underwent a CT of the hip which showed evidence of subcutaneous fat stranding over the right hip consistent with cellulitis without corresponding evidence of subcutaneous gas.   CT of the hip also showed a fluid collection the right lateral subcutaneous fat extending medially towards the greater trochanter however potential fluid collection was noted to extend laterally beyond the field-of-view rendering unclear if it communicates with the joint space.  Orthopedic surgery was consulted and she was admitted for right hip cellulitis. ID was also consulted during hospitalization.  Interval History:  No events overnight.  Resting in bed comfortably seen this morning.  Concerned about some of her ongoing weight gain and fitting in her wheelchair.   Assessment and Plan:  Sepsis secondary to  Cellulitis of the Right Hip - presented with 2 to 3 days of progressive right hip erythema with associated tenderness, increased warmth to touch as well as swelling -CT was notable for possible thick fluid collection of unclear significance.  This was reviewed by orthopedic surgery and not felt to be related to the joint space and may be an old fluid collection.  Aspiration with IR was not recommended -ID also following and patient remains on cefepime and daptomycin  Noncardiac CP -She has had 3 to 4 days of bilateral anterior chest wall discomfort which is worse with palpation in addition to being exacerbated with rotational movement of the upper torso which is likely suggestive of musculoskeletal involvement -CTA of the chest showed no evidence of acute processes and did show cardiomegaly with small pericardial effusion and three-vessel coronary atherosclerosis as well as small bilateral pleural effusions with adjacent atelectasis in the lower lobes and aortic atherosclerosis -Pain was felt to be nonexertional and appeared atypical for ACS -EKG was nonacute and showed no ischemic changes and no evidence of ST elevation -Troponin trend negative   Acute respiratory failure with hypoxia, improving  - not on chronic home O2 - now weaned further; continue weaning   Chronic Diastolic CHF -BNP was [id number] - no s/s exacerbation   CKD Stage 3a Metabolic Acidosis -IV fluid hydration has been discontinued - renal fxn baseline    Hyponatremia, Hypokalemia, Hypocalciemia  - replete as needed   Normocytic Anemia -Checked Anemia Panel and showed an iron level of 15, UIBC of 132, TIBC of 147, saturation ratios of 10%, ferritin level of 578, folate  of 11.3, vitamin B12 738 -Continue monitor for signs and symptoms of bleeding; no overt bleeding noted   GERD Moderate sized hiatal hernia -Continue PPI   Acquired Hypothyroidism -She was initially made n.p.o. but will put her on a diet -She takes 50  mcg by mouth 1 time a day every 2 days as well as 25 mcg per mouth 1 time a day every 2 days   Hypoalbuminemia - continue diet   Morbid Obesity Obstructive Sleep Apnea -Continue CPAP - Complicates overall prognosis and care - Body mass index is 47.33 kg/m.   Physical Debility -Wheelchair bound and in a facility -Likely at her functional baseline so no indication to have PT/OT to evaluate   Old records reviewed in assessment of this patient  Antimicrobials: Rocephin 07/02/2022 >> 07/03/2022 Vancomycin 07/03/2022 >> 07/04/2022 Cefepime 07/04/2022 >> current Daptomycin 07/05/2022 >> current  DVT prophylaxis:  SCDs Start: 07/03/22 0406   Code Status:   Code Status: Full Code  Mobility Assessment (last 72 hours)     Mobility Assessment     Row Name 07/06/22 0730 07/04/22 0904 07/03/22 2146 07/03/22 1700     Does patient have an order for bedrest or is patient medically unstable Yes- Bedfast (Level 1) - Complete Yes- Bedfast (Level 1) - Complete Yes- Bedfast (Level 1) - Complete Yes- Bedfast (Level 1) - Complete             Barriers to discharge:  Disposition Plan:  Home Status is: Inpt  Objective: Blood pressure (!) 117/40, pulse 69, temperature (!) 97.5 F (36.4 C), temperature source Oral, resp. rate 18, height '5\' 8"'$  (1.727 m), weight (!) 141.2 kg, SpO2 93 %.  Examination:  Physical Exam Constitutional:      General: She is not in acute distress.    Appearance: Normal appearance. She is obese. She is not ill-appearing.  HENT:     Head: Normocephalic and atraumatic.     Mouth/Throat:     Mouth: Mucous membranes are moist.  Eyes:     Extraocular Movements: Extraocular movements intact.  Cardiovascular:     Rate and Rhythm: Normal rate and regular rhythm.  Pulmonary:     Effort: Pulmonary effort is normal. No respiratory distress.     Breath sounds: Normal breath sounds. No wheezing.  Abdominal:     General: Bowel sounds are normal. There is no distension.      Palpations: Abdomen is soft.     Tenderness: There is no abdominal tenderness.  Musculoskeletal:        General: Swelling present.     Cervical back: Normal range of motion and neck supple.     Comments: R hip noted with induration and erythema, minimal TTP  Skin:    General: Skin is warm and dry.  Neurological:     General: No focal deficit present.     Mental Status: She is alert.  Psychiatric:        Mood and Affect: Mood normal.      Consultants:  ID Ortho  Procedures:    Data Reviewed: Results for orders placed or performed during the hospital encounter of 07/02/22 (from the past 24 hour(s))  CK     Status: Abnormal   Collection Time: 07/06/22  7:02 AM  Result Value Ref Range   Total CK 12 (L) 38 - 234 U/L  CBC with Differential/Platelet     Status: Abnormal   Collection Time: 07/06/22  7:02 AM  Result Value Ref Range  WBC 11.9 (H) 4.0 - 10.5 K/uL   RBC 4.03 3.87 - 5.11 MIL/uL   Hemoglobin 10.8 (L) 12.0 - 15.0 g/dL   HCT 37.0 36.0 - 46.0 %   MCV 91.8 80.0 - 100.0 fL   MCH 26.8 26.0 - 34.0 pg   MCHC 29.2 (L) 30.0 - 36.0 g/dL   RDW 14.3 11.5 - 15.5 %   Platelets 536 (H) 150 - 400 K/uL   nRBC 0.0 0.0 - 0.2 %   Neutrophils Relative % 81 %   Neutro Abs 9.5 (H) 1.7 - 7.7 K/uL   Lymphocytes Relative 7 %   Lymphs Abs 0.8 0.7 - 4.0 K/uL   Monocytes Relative 9 %   Monocytes Absolute 1.1 (H) 0.1 - 1.0 K/uL   Eosinophils Relative 2 %   Eosinophils Absolute 0.2 0.0 - 0.5 K/uL   Basophils Relative 0 %   Basophils Absolute 0.1 0.0 - 0.1 K/uL   Immature Granulocytes 1 %   Abs Immature Granulocytes 0.14 (H) 0.00 - 0.07 K/uL  Comprehensive metabolic panel     Status: Abnormal   Collection Time: 07/06/22  7:02 AM  Result Value Ref Range   Sodium 136 135 - 145 mmol/L   Potassium 3.4 (L) 3.5 - 5.1 mmol/L   Chloride 109 98 - 111 mmol/L   CO2 19 (L) 22 - 32 mmol/L   Glucose, Bld 106 (H) 70 - 99 mg/dL   BUN 20 8 - 23 mg/dL   Creatinine, Ser 1.01 (H) 0.44 - 1.00 mg/dL    Calcium 8.4 (L) 8.9 - 10.3 mg/dL   Total Protein 6.4 (L) 6.5 - 8.1 g/dL   Albumin 2.3 (L) 3.5 - 5.0 g/dL   AST 36 15 - 41 U/L   ALT 25 0 - 44 U/L   Alkaline Phosphatase 63 38 - 126 U/L   Total Bilirubin 0.3 0.3 - 1.2 mg/dL   GFR, Estimated 56 (L) >60 mL/min   Anion gap 8 5 - 15  Phosphorus     Status: None   Collection Time: 07/06/22  7:02 AM  Result Value Ref Range   Phosphorus 3.6 2.5 - 4.6 mg/dL  Magnesium     Status: None   Collection Time: 07/06/22  7:02 AM  Result Value Ref Range   Magnesium 2.4 1.7 - 2.4 mg/dL    I have reviewed pertinent nursing notes, vitals, labs, and images as necessary. I have ordered labwork to follow up on as indicated.  I have reviewed the last notes from staff over past 24 hours. I have discussed patient's care plan and test results with nursing staff, CM/SW, and other staff as appropriate.  Time spent: Greater than 50% of the 55 minute visit was spent in counseling/coordination of care for the patient as laid out in the A&P.   LOS: 3 days   Dwyane Dee, MD [facility] 07/06/2022, 2:44 PM

## 2022-07-06 NOTE — Hospital Course (Addendum)
Madison [name] is an 82 yo female with PMH HTN, CKD 3, GERD, obesity, OSA, hypothyroidism, dCHF who presented with worsening erythema, tenderness, warmth, and swelling of her right hip. She has had a history of a right total hip arthroplasty performed by orthopedic surgery at [facility] and subsequent to that [facility] has taken over as primary Ortho Surgical group for her.  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  She underwent a CTA of the chest PE protocol to evaluate for her chest pain and it showed small bilateral pleural effusions with adjacent atelectasis in the upper lobes but there was no evidence of any acute cardiopulmonary processes, pulmonary embolus or evidence of infiltrate or pneumothorax.   Subsequently she underwent a CT of the hip which showed evidence of subcutaneous fat stranding over the right hip consistent with cellulitis without corresponding evidence of subcutaneous gas.   CT of the hip also showed a fluid collection the right lateral subcutaneous fat extending medially towards the greater trochanter however potential fluid collection was noted to extend laterally beyond the field-of-view rendering unclear if it communicates with the joint space.  Orthopedic surgery was consulted and she was admitted for right hip cellulitis. ID was also consulted during hospitalization. She was treated with IV antibiotics which were ultimately transitioned to Augmentin and doxycycline to complete course.

## 2022-07-07 MED ORDER — CIPROFLOXACIN HCL 500 MG PO TABS
750.0000 mg | ORAL_TABLET | Freq: Two times a day (BID) | ORAL | Status: DC
  Administered 2022-07-07: 750 mg via ORAL
  Filled 2022-07-07: qty 1

## 2022-07-07 MED ORDER — DOXYCYCLINE HYCLATE 100 MG PO TABS: 100.0000 mg | ORAL_TABLET | Freq: Two times a day (BID) | ORAL | Status: DC

## 2022-07-07 MED ORDER — DOXYCYCLINE HYCLATE 100 MG PO CAPS: 100.0000 mg | ORAL_CAPSULE | Freq: Two times a day (BID) | ORAL | 0 refills | Status: DC

## 2022-07-07 MED ORDER — CIPROFLOXACIN HCL 750 MG PO TABS: 750.0000 mg | ORAL_TABLET | Freq: Two times a day (BID) | ORAL | 0 refills | Status: DC

## 2022-07-07 NOTE — TOC Transition Note (Signed)
Transition of Care [facility]) - CM/SW Discharge Note   Patient Details  Name: Madison [name] MRN: [id number] Date of Birth: November 14, 1940  [facility] of [facility] CM/SW Contact:  Angelita Ingles, RN Phone Number:[phone]  07/07/2022, 2:31 PM   Clinical Narrative:    Updated FL2 and discharge summary faxed to [facility]. CM has verified that patient is able to return.  Transportation has been arranged per PTAR. D/c packet is at nurses station. No other needs noted TOC signing off Please call report to  [phone] Ext [phone] [address]  .      Barriers to Discharge: No Barriers Identified   Patient Goals and CMS Choice      Discharge Placement                    Name of family member notified: [name] Patient and family notified of of transfer: 07/07/22  Discharge Plan and Services Additional resources added to the After Visit Summary for                  DME Arranged: N/A DME Agency: NA       HH Arranged: NA HH Agency: NA        Social Determinants of Health (SDOH) Interventions SDOH Screenings   Food Insecurity: No Food Insecurity (07/03/2022)  Housing: Low Risk  (07/03/2022)  Transportation Needs: No Transportation Needs (07/03/2022)  Utilities: Not At Risk (07/03/2022)  Depression (PHQ2-9): Low Risk  (05/31/2022)  Tobacco Use: Medium Risk (07/03/2022)     Readmission Risk Interventions     No data to display

## 2022-07-07 NOTE — NC FL2 (Signed)
[place] MEDICAID FL2 LEVEL OF CARE FORM     IDENTIFICATION  Patient Name: Madison [name] Birthdate: 01-17-1941 Sex: female Admission Date (Current Location): 07/02/2022  [facility] and [place] Number:  Herbalist and Address:  [facility],  [address] [place], [place]      Provider Number: [id number]  Attending Physician Name and Address:  Dwyane Dee, MD  Relative Name and Phone Number:  Demoni Lawton [phone]    Current Level of Care: Hospital Recommended Level of Care: [place] Prior Approval Number:    Date Approved/Denied:   PASRR Number: [id number]  Discharge Plan: SNF    Current Diagnoses: Patient Active Problem List   Diagnosis Date Noted   Infected abrasion of right hip, initial encounter 07/03/2022   Cellulitis of right hip 07/03/2022   Atypical chest pain 07/03/2022   Chronic diastolic CHF (congestive heart failure) ([place]) 07/03/2022   Weight gain 06/27/2022   Malaise 06/27/2022   COVID-19 virus infection 05/16/2022   Ingrown left greater toenail 04/12/2022   Dysuria 04/12/2022   Absence of hip joint, 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   Gait disorder 01/27/2022   Pressure ulcer, stage 2 ([place]) 07/12/2021   Candidiasis of skin 04/13/2021   UTI (urinary tract infection) 04/02/2021   Hypokalemia 02/26/2021   Pressure ulcer of thigh, stage 2 ([place]) 02/19/2021   Elevated liver enzymes 02/16/2021   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[place]) 02/16/2021   Right hip pain 02/05/2021   Sepsis ([place]) 02/05/2021   Blood loss anemia 02/05/2021   Rheumatoid arthritis ([place]) 02/05/2021   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   Acquired hypothyroidism 02/05/2021   Drug rash 02/05/2021   Stage 3a chronic kidney disease (CKD) ([place]) 01/30/2019   Obesity (BMI 30-39.9) 09/25/2017   Obstructive sleep apnea    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   Metabolic bone disease [id number]   Vitamin D deficiency 09/01/2015   Chronic diarrhea  07/27/2015   Edema of lower extremity 11/11/2014   Cardiac disease 03/10/2014   Heart disease 03/10/2014   Encounter for preprocedural cardiovascular examination    Benign essential HTN    GERD (gastroesophageal reflux disease)    Adiposity    Supraventricular tachycardia (HCC)    Decreased cardiac output    Urinary incontinence     Orientation RESPIRATION BLADDER Height & Weight     Self, Time, Situation, Place  Normal Incontinent Weight: (!) 145.5 kg Height:  '5\' 8"'$  (172.7 cm)  BEHAVIORAL SYMPTOMS/MOOD NEUROLOGICAL BOWEL NUTRITION STATUS     (n/a) Continent Diet  AMBULATORY STATUS COMMUNICATION OF NEEDS Skin   Total Care (Bedfast) Verbally Other (Comment) (right hip cellulitis)                       Personal Care Assistance Level of Assistance  Bathing, Feeding, Dressing Bathing Assistance: Maximum assistance Feeding assistance: Limited assistance Dressing Assistance: Maximum assistance     Functional Limitations Info  Sight, Hearing, Speech Sight Info: Impaired (Eye glasses) Hearing Info: Adequate Speech Info: Adequate    SPECIAL CARE FACTORS FREQUENCY                       Contractures Contractures Info: Not present    Additional Factors Info  Code Status, Allergies, Psychotropic, Insulin Sliding Scale, Isolation Precautions, Suctioning Needs Code Status Info: Full Allergies Info: Keflex (Cephalexin), Macrobid (Nitrofurantoin), Amoxicillin Psychotropic Info: see discharge summary Insulin Sliding Scale Info: see discharge summary Isolation Precautions Info: n/a  Suctioning Needs: n/a   Current Medications (07/07/2022):  This is the current hospital active medication list Current Facility-Administered Medications  Medication Dose Route Frequency Provider Last Rate Last Admin   acetaminophen (TYLENOL) tablet 650 mg  650 mg Oral Q6H PRN Howerter, Justin B, DO       Or   acetaminophen (TYLENOL) suppository 650 mg  650 mg Rectal Q6H PRN Howerter, Justin  B, DO       arformoterol (BROVANA) nebulizer solution 15 mcg  15 mcg Nebulization BID Sheikh, Omair Leighton, DO   15 mcg at 07/06/22 1931   budesonide (PULMICORT) nebulizer solution 0.25 mg  0.25 mg Nebulization BID Sheikh, Omair Latif, DO   0.25 mg at 07/07/22 [id number]   ciprofloxacin (CIPRO) tablet 750 mg  750 mg Oral BID Rosiland Oz, MD   750 mg at 07/07/22 1103   doxycycline (VIBRA-TABS) tablet 100 mg  100 mg Oral Q12H Rosiland Oz, MD       fentaNYL (SUBLIMAZE) injection 50 mcg  50 mcg Intravenous Q2H PRN Howerter, Justin B, DO       guaiFENesin (MUCINEX) 12 hr tablet 1,200 mg  1,200 mg Oral BID Sheikh, Omair [place], DO   1,200 mg at 07/07/22 1103   ipratropium (ATROVENT) nebulizer solution 0.5 mg  0.5 mg Nebulization Q6H Sheikh, Georgina Quint Latif, DO   0.5 mg at 07/07/22 [id number]   levalbuterol (XOPENEX) nebulizer solution 0.63 mg  0.63 mg Nebulization Q6H Sheikh, Georgina Quint [place], DO   0.63 mg at 07/07/22 [id number]   levothyroxine (SYNTHROID) tablet 25 mcg  25 mcg Oral Q48H Raiford Noble [place], DO   25 mcg at 07/07/22 [id number]   levothyroxine (SYNTHROID) tablet 50 mcg  50 mcg Oral Q48H [address] [place], DO   50 mcg at 07/06/22 0557   melatonin tablet 3 mg  3 mg Oral QHS PRN Howerter, Justin B, DO       naloxone (NARCAN) injection 0.4 mg  0.4 mg Intravenous PRN Howerter, Justin B, DO       ondansetron (ZOFRAN) injection 4 mg  4 mg Intravenous Q6H PRN Howerter, Justin B, DO       pantoprazole (PROTONIX) EC tablet 40 mg  40 mg Oral Daily Karren Cobble, RPH   40 mg at 07/07/22 1103     Discharge Medications: Please see discharge summary for a list of discharge medications.  Relevant Imaging Results:  Relevant Lab Results:   Additional Information SS# [id number]  Angelita Ingles, RN

## 2022-07-07 NOTE — Progress Notes (Addendum)
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)   Sepsis (HCC)   Acquired hypothyroidism   Atypical chest pain   Chronic diastolic CHF (congestive heart failure) (HCC)  Antibiotics:  Vancomycin 3/9-c Ceftriaxone 3/9-3/10, cefepime 3/11-c   Lines/Hardware: Bilateral kne arthroplasty   Interval Events: afebrile, no plans for orthopedic or IR intervention   Assessment 89 Y O female with PMH as below including 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   3/9 CT Rt hip  possible central seroma about the proximal end of the femoral diaphysis and acetabulum Increased size of the thick-walled fluid collection in the lateral right subcutaneous fat extending medially to the greater trochanter fragmentation.  There is the suggestion of subcutaneous stranding lateral to the fluid collection. If there is concern for abscess, consider further evaluation with ultrasound and/or fluid sampling. It is unclear if the large subcutaneous fluid collection communicates with the joint space.    Per ortho, no orthopedic intervention as well as IR intervention recommended. Fluid collection was thought to represent an old collection of blood or other  fluid type and not necessarily related to her joint space and IR aspiration also thought to be of no substantial value.    Recommendations No indication for prolonged IV abtx given low suspicion for joint infection. Would treat this as a complicated skin and soft tissue infection  Will switch IV to PO doxycycline and ciprofloxacin and complete 4 weeks of tx. EOT 08/01/22 Fu in 3 weeks to monitor clinical progress and will consider repeat imaging of rt hip at that time Fu with Orthopedics  ID will so, please call with questions  D/w ID Pharm D and primary   Rest of the management as per the primary team. Thank you for the consult. Please page with pertinent questions or concerns.  ______________________________________________________________________ Subjective patient seen and examined at the bedside. Pain at the rt hip is improving. Denies nausea, vomiting and diarrhea   Vitals BP (!) 100/46 (BP Location: Left Arm)   Pulse 83   Temp 97.9 F (36.6 C) (Oral)   Resp 18   Ht '5\' 8"'$  (1.727 m)   Wt (!) 145.5 kg   SpO2 92%   BMI 48.77 kg/m    Physical Exam Constitutional:  obese elderly female lying in the bed and not in acute distress     Comments:   Cardiovascular:     Rate and Rhythm: Normal rate and regular rhythm.     Heart sounds:   Pulmonary:     Effort: Pulmonary effort is normal on [place]    Comments:   Abdominal:     Palpations: Abdomen is soft.     Tenderness: non distended   Musculoskeletal:        General: No swelling or tenderness in peripheral joints. Lateral RT hip has  diffuse swelling/some induration with improving redness, mild tenderness. No warmth, fluctuance or crepitus   Skin:    Comments: No rashes   Neurological:     General: awake, alert and oriented, follows commands  Psychiatric:        Mood and Affect: Mood normal.   Pertinent Microbiology Results for orders placed or performed during the hospital encounter of 07/02/22  Resp panel by RT-PCR  (RSV, Flu A&B, Covid) Anterior Nasal Swab     Status: None   Collection Time: 07/02/22  6:35 PM   Specimen: Anterior Nasal Swab  Result Value Ref Range Status   SARS Coronavirus 2 by RT PCR NEGATIVE NEGATIVE Final    Comment: (NOTE) SARS-CoV-2 target nucleic acids are NOT DETECTED.  The SARS-CoV-2 RNA is generally detectable in upper respiratory specimens during the acute phase of infection. The lowest concentration of SARS-CoV-2 viral copies this assay can detect is 138 copies/mL. A negative result does not preclude SARS-Cov-2 infection and should not be used as the sole basis for treatment or other patient management decisions. A negative result may occur with  improper specimen collection/handling, submission of specimen other than nasopharyngeal swab, presence of viral mutation(s) within the areas targeted by this assay, and inadequate number of viral copies(<138 copies/mL). A negative result must be combined with clinical observations, patient history, and epidemiological information. The expected result is Negative.  Fact Sheet for Patients:  EntrepreneurPulse.com.au  Fact Sheet for Healthcare Providers:  IncredibleEmployment.be  This test is no t yet approved or cleared by the [place] FDA and  has been authorized for detection and/or diagnosis of SARS-CoV-2 by FDA under an Emergency Use Authorization (EUA). This EUA will remain  in effect (meaning this test can be used) for the duration of the COVID-19 declaration under Section 564(b)(1) of the Act, 21 U.S.C.section 360bbb-3(b)(1), unless the authorization is terminated  or revoked sooner.       Influenza A by PCR NEGATIVE NEGATIVE Final   Influenza B by PCR NEGATIVE NEGATIVE Final    Comment: (NOTE) The Xpert Xpress SARS-CoV-2/FLU/RSV plus assay is intended as an aid in the diagnosis of influenza from Nasopharyngeal swab specimens and should not be used as a sole basis for  treatment. Nasal washings and aspirates are unacceptable for Xpert Xpress SARS-CoV-2/FLU/RSV testing.  Fact Sheet for Patients: EntrepreneurPulse.com.au  Fact Sheet for Healthcare Providers: IncredibleEmployment.be  This test is not yet approved or cleared by the [place] FDA and has been authorized for detection and/or diagnosis of SARS-CoV-2 by FDA under an Emergency Use Authorization (EUA). This EUA will remain in effect (meaning this test can be used) for the duration of the COVID-19 declaration under Section 564(b)(1) of the Act, 21 U.S.C. section 360bbb-3(b)(1), unless the authorization is terminated or revoked.     Resp Syncytial Virus by PCR NEGATIVE NEGATIVE Final    Comment: (NOTE) Fact Sheet for Patients: EntrepreneurPulse.com.au  Fact Sheet for Healthcare Providers: IncredibleEmployment.be  This test is not yet approved or cleared by the [place] FDA and has been authorized for detection and/or diagnosis of SARS-CoV-2 by FDA under an Emergency Use Authorization (EUA). This EUA will remain in effect (meaning this test can be used) for the duration of the COVID-19 declaration under Section 564(b)(1) of the Act, 21 U.S.C. section 360bbb-3(b)(1), unless the authorization is terminated or revoked.  Performed at [facility], [place] [address] [place], [place] [address]   Culture, blood (routine x 2)     Status: None (Preliminary  result)   Collection Time: 07/02/22 10:00 PM   Specimen: BLOOD  Result Value Ref Range Status   Specimen Description   Final    BLOOD SITE NOT SPECIFIED Performed at [facility], [address] [place], [place] [address]    Special Requests   Final    BOTTLES DRAWN AEROBIC AND ANAEROBIC Blood Culture results may not be optimal due to an inadequate volume of blood received in culture bottles Performed at [place] [address], [place], [place] [address]    Culture   Final    NO GROWTH 3 DAYS Performed at [facility], [place] [address] [place], [place] [address]    Report Status PENDING  Incomplete  Culture, blood (routine x 2)     Status: None (Preliminary result)   Collection Time: 07/02/22 10:15 PM   Specimen: BLOOD  Result Value Ref Range Status   Specimen Description   Final    BLOOD BLOOD RIGHT HAND Performed at [place] [address], [place], [place] [address]    Special Requests   Final    BOTTLES DRAWN AEROBIC AND ANAEROBIC Blood Culture results may not be optimal due to an excessive volume of blood received in culture bottles Performed at [place] [address] [place], [place] [address]    Culture   Final    NO GROWTH 3 DAYS Performed at [facility], [place] [address] [place], [place] [address]    Report Status PENDING  Incomplete  MRSA Next Gen by PCR, Nasal     Status: None   Collection Time: 07/03/22  5:10 PM   Specimen: Nasal Mucosa; Nasal Swab  Result Value Ref Range Status   MRSA by PCR Next Gen NOT DETECTED NOT DETECTED Final    Comment: (NOTE) The GeneXpert MRSA Assay (FDA approved for NASAL specimens only), is one component of a comprehensive MRSA colonization surveillance program. It is not intended to diagnose MRSA infection nor to guide or monitor treatment for MRSA infections. Test performance is not FDA approved in patients less than 13 years old. Performed at [facility], [place] [address] [place], [place] [address]     Pertinent Lab.    Latest Ref Rng & Units 07/06/2022    7:02 AM 07/05/2022    5:38 AM 07/04/2022    5:02 AM  CBC  WBC 4.0 - 10.5 K/uL 11.9  11.7  12.6   Hemoglobin 12.0 - 15.0 g/dL 10.8  11.4  10.9   Hematocrit 36.0 - 46.0 % 37.0  38.5  36.4   Platelets 150 - 400 K/uL 536  507  475       Latest Ref Rng & Units 07/06/2022    7:02 AM 07/05/2022    5:38 AM  07/04/2022    5:02 AM  CMP  Glucose 70 - 99 mg/dL 106  94  96   BUN 8 - 23 mg/dL '20  24  25   '$ Creatinine 0.44 - 1.00 mg/dL 1.01  1.14  1.21   Sodium 135 - 145 mmol/L 136  135  134   Potassium 3.5 - 5.1 mmol/L 3.4  3.7  4.2   Chloride 98 - 111 mmol/L 109  106  104   CO2 22 - 32 mmol/L '19  19  20   '$ Calcium 8.9 - 10.3 mg/dL 8.4  8.4  8.2   Total Protein 6.5 - 8.1 g/dL 6.4  6.3  6.2   Total Bilirubin 0.3 - 1.2 mg/dL 0.3  0.6  0.6   Alkaline Phos 38 - 126 U/L 63  66  71   AST 15 - 41 U/L 36  30  23   ALT 0 - 44 U/L '25  21  17      '$ Pertinent Imaging today Plain films and CT images have been personally visualized and interpreted; radiology reports have been reviewed. Decision making incorporated into the Impression / Recommendations.  DG CHEST PORT 1 VIEW  Result Date: 07/06/2022 CLINICAL DATA:  Shortness of breath. EXAM: PORTABLE CHEST 1 VIEW COMPARISON:  None of 07/04/2022 FINDINGS: Stable cardiomediastinal contours. Lung volumes are low. Mild scratch set increase interstitial markings are identified bilaterally concerning for pulmonary edema. Cannot exclude small pleural effusions. Decreased aeration to the left base may represent atelectasis or airspace disease. IMPRESSION: 1. Suspect mild congestive heart failure. 2. Decreased aeration to the left base may represent atelectasis or airspace disease. Electronically Signed   By: Kerby Moors M.D.   On: 07/06/2022 08:05   DG CHEST PORT 1 VIEW  Result Date: 07/04/2022 CLINICAL DATA:  Shortness of breath. EXAM: PORTABLE CHEST 1 VIEW COMPARISON:  07/03/2018 for FINDINGS: Stable cardiomediastinal contours. Low lung volumes. Unchanged left base atelectasis. No signs of pleural effusion, interstitial edema or consolidation. IMPRESSION: No change in aeration to the lungs compared with previous exam. Electronically Signed   By: Kerby Moors M.D.   On: 07/04/2022 08:18   DG CHEST PORT 1 VIEW  Result Date: 07/03/2022 CLINICAL DATA:  Shortness of  breath. EXAM: PORTABLE CHEST 1 VIEW COMPARISON:  07/02/2022 and prior studies FINDINGS: This is a low volume study. The cardiomediastinal silhouette is unchanged. Mild LEFT basilar opacities are noted. There is no evidence of pneumothorax or pleural effusion. No acute bony abnormalities are noted. IMPRESSION: Mild LEFT basilar opacities, favor atelectasis but infection/pneumonia is not excluded. Electronically Signed   By: Margarette Canada M.D.   On: 07/03/2022 16:30   CT Hip Right Wo Contrast  Result Date: 07/02/2022 CLINICAL DATA:  Septic arthritis suspected. Hip x-ray done. Hip pain and redness in the right hip. EXAM: CT OF THE RIGHT HIP WITHOUT CONTRAST TECHNIQUE: Multidetector CT imaging of the right hip was performed according to the standard protocol. Multiplanar CT image reconstructions were also generated. RADIATION DOSE REDUCTION: This exam was performed according to the departmental dose-optimization program which includes automated exposure control, adjustment of the mA and/or kV according to patient size and/or use of iterative reconstruction technique. 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 Increased callus formation about the acetabulum with redemonstrated fragmentation along the medial aspect of the acetabulum. Unchanged fragmentation of the right greater trochanter. MR nondisplaced longitudinal fracture and osseous fragmentation about the proximal femoral diaphysis. The proximal end of the femoral diaphysis has shifted anteriorly compared to 06/08/2021. Unchanged mixed soft tissue and fluid components at the site of the prior femoral head and neck component compatible with granulation tissue. There may be a central fluid component/seroma though this is similar to decreased from 06/08/2021. Ligaments Suboptimally assessed by CT. Muscles and Tendons Muscle  atrophy of the gluteal muscles. Soft tissues Similar postsurgical scarring in the subcutaneous fat anterior to the right hip. Increased size of the thick-walled fluid collection in the lateral right subcutaneous fat extending medially to the greater trochanter fragmentation. This is not completely visualized on this exam  and extends laterally beyond the lateral aspect of the scan. Suggestion of adjacent stranding lateral to the fluid collection though this is largely excluded from the field-of-view. IMPRESSION: 1. 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 There is some interval healing change since 06/08/2021. 2. Unchanged granulation tissue and possible central seroma about the proximal end of the femoral diaphysis and acetabulum. 3. Increased size of the thick-walled fluid collection in the lateral right subcutaneous fat extending medially to the greater trochanter fragmentation. This is not completely visualized on this exam and extends laterally beyond the field-of-view. There is the suggestion of subcutaneous stranding lateral to the fluid collection. Clinical correlation is recommended to exclude cellulitis. If there is concern for abscess, consider further evaluation with ultrasound and/or fluid sampling. It is unclear if the large subcutaneous fluid collection communicates with the joint space. Electronically Signed   By: Placido Sou M.D.   On: 07/02/2022 23:43   DG Hip Unilat With Pelvis 2-3 Views Right  Result Date: 07/02/2022 CLINICAL DATA:  Hip pain with redness and swelling to the right hip EXAM: DG HIP (WITH OR WITHOUT PELVIS) 2-3V RIGHT COMPARISON:  CT of the right hip 06/08/2021 FINDINGS: Findings appear similar to CT 06/08/2021 given differences in technique. Changes from prior right hip arthroplasty with subsequent hardware removal. Fragmentation of the right greater trochanter. Cortical thickening and sclerosis about the proximal femoral shaft and  acetabulum. No definite acute fracture. The soft tissue thickening and fluid about the right hip was better demonstrated on CT. Contrast within the bladder IMPRESSION: No definite acute fracture. Similar changes about the right hip from right hip arthroplasty and subsequent heart were removal when compared with CT 06/08/2021. Electronically Signed   By: Placido Sou M.D.   On: 07/02/2022 22:01   CT Angio Chest PE W/Cm &/Or Wo Cm  Result Date: 07/02/2022 CLINICAL DATA:  Pulmonary embolism suspected.  High probability. EXAM: CT ANGIOGRAPHY CHEST WITH CONTRAST TECHNIQUE: Multidetector CT imaging of the chest was performed using the standard protocol during bolus administration of intravenous contrast. Multiplanar CT image reconstructions and MIPs were obtained to evaluate the vascular anatomy. RADIATION DOSE REDUCTION: This exam was performed according to the departmental dose-optimization program which includes automated exposure control, adjustment of the mA and/or kV according to patient size and/or use of iterative reconstruction technique. CONTRAST:  92m OMNIPAQUE IOHEXOL 350 MG/ML SOLN COMPARISON:  Portable chest today, portable chest 10/24/2020. FINDINGS: Cardiovascular: There is mild cardiomegaly and small pericardial effusion. There are scattered three-vessel coronary artery calcifications. The pulmonary veins are normal caliber. The pulmonary arteries are normal caliber and clear through the segmental divisions. Due to breathing motion, the subsegmental arterial bed is obscured and not evaluated. There is mild aortic atherosclerosis and tortuosity without aneurysm, stenosis or dissection. The great vessels are clear. Mediastinum/Nodes: There is a moderate-sized hiatal hernia with gastroesophageal reflux to the level of the aortic arch. The esophageal thickness is normal. The trachea and main bronchi are patent. There are minimal retained secretions in the right main bronchus. There is no intrathoracic  or axillary adenopathy. Axillary spaces are clear. Normal thyroid. Lungs/Pleura: There small symmetric bilateral layering pleural effusions and adjacent coarse atelectatic markings in the bilateral lower lobes. There is mild posterior atelectasis in the upper lung fields. Reticulated scarring noted both apices. No focal infiltrate or nodule is seen through the breathing motion. There is no pulmonary edema. Upper Abdomen: Status post cholecystectomy. No biliary dilatation. Moderate hepatic steatosis. No acute upper abdominal findings.  Musculoskeletal: There is thoracic kyphosis, osteopenia, spondylosis and multilevel degenerative discs, multilevel bridging enthesopathy. Bilateral acromiohumeral abutment consistent with chronic rotator cuff arthropathy and degenerative tears is also noted with bilateral supraspinatus fatty atrophy. No acute or other significant musculoskeletal findings. Review of the MIP images confirms the above findings. IMPRESSION: 1. No evidence of arterial dilatation through the segmental divisions. The subsegmental arterial bed is obscured by breathing motion. 2. Cardiomegaly with small pericardial effusion and three-vessel coronary atherosclerosis. 3. Small bilateral pleural effusions with adjacent atelectasis in the lower lobes. 4. Moderate-sized hiatal hernia with gastroesophageal reflux to the level of the aortic arch. Aspiration precautions may be indicated. 5. Aortic atherosclerosis. 6. Moderate hepatic steatosis. 7. Osteopenia, kyphosis and degenerative change. 8. 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 Aortic Atherosclerosis (ICD10-I70.0). Electronically Signed   By: Telford Nab M.D.   On: 07/02/2022 21:32   DG Chest Port 1 View  Result Date: 07/02/2022 CLINICAL DATA:  Sternal chest pain, worse with movement EXAM: PORTABLE CHEST 1 VIEW COMPARISON:  Chest radiograph dated 10/24/2020 FINDINGS: Low lung volumes with  bronchovascular crowding. Bibasilar and bilateral perihilar patchy opacities. 0w0 No pleural effusion or pneumothorax. The heart size and mediastinal contours are within normal limits. The visualized skeletal structures are unremarkable. IMPRESSION: 1. Low lung volumes with bronchovascular crowding. 2. Bibasilar and bilateral perihilar patchy opacities, which could be due to atelectasis or infection. Electronically Signed   By: Darrin Nipper M.D.   On: 07/02/2022 19:08     I spent 55 minutes for this patient encounter including review of prior medical records, coordination of care with primary/other specialist with greater than 50% of time being face to face/counseling and discussing diagnostics/treatment plan with the patient/family.  Electronically signed by:   Rosiland Oz, MD Infectious Disease Physician [facility] for Infectious Disease Pager: [phone]

## 2022-07-07 NOTE — Progress Notes (Signed)
Pt taken off 02 for 3 minutes and oxygen saturation monitored, maintained at 95%.  Will leave on room air and cont to monitor

## 2022-07-07 NOTE — Care Management Important Message (Signed)
Important Message  Patient Details  Name: Madison [name] MRN: [id number] Date of Birth: [birth year]   Medicare Important Message Given:  Yes     Memory Argue 07/07/2022, 3:58 PM

## 2022-07-07 NOTE — Discharge Summary (Addendum)
Physician Discharge Summary   Madison [name] [id number] DOB: 06-Jul-1940 DOA: 07/02/2022  PCP: Oswaldo Conroy, MD  Admit date: 07/02/2022 Discharge date: 07/07/2022  [facility]  [facility] Discharging physician: Dwyane Dee, MD Barriers to discharge: none  Recommendations for Outpatient Follow-up:  Follow up with ID and ortho Wean mirtazapine to off per patient request due to weight gain Continue on ciprofloxacin and doxycycline until completion date of 08/01/2022 per ID  Discharge Condition: stable CODE STATUS: Full Diet recommendation:  Diet Orders (From admission, onward)     Start     Ordered   07/07/22 0000  Diet general        07/07/22 1341   07/03/22 0837  Diet regular Room service appropriate? Yes; Fluid consistency: Thin  Diet effective now       Question Answer Comment  Room service appropriate? Yes   Fluid consistency: Thin      07/03/22 0836            Hospital Course: Ms. Norquist is an 82 yo female with PMH HTN, CKD 3, GERD, obesity, OSA, hypothyroidism, dCHF who presented with worsening erythema, tenderness, warmth, and swelling of her right hip. She has had a history of a right total hip arthroplasty performed by orthopedic surgery at [facility] and subsequent to that [facility] has taken over as primary Ortho Surgical group for her.  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  She underwent a CTA of the chest PE protocol to evaluate for her chest pain and it showed small bilateral pleural effusions with adjacent atelectasis in the upper lobes but there was no evidence of any acute cardiopulmonary processes, pulmonary embolus or evidence of infiltrate or pneumothorax.   Subsequently she underwent a  CT of the hip which showed evidence of subcutaneous fat stranding over the right hip consistent with cellulitis without corresponding evidence of subcutaneous gas.   CT of the hip also showed a fluid collection the right lateral subcutaneous fat extending medially towards the greater trochanter however potential fluid collection was noted to extend laterally beyond the field-of-view rendering unclear if it communicates with the joint space.  Orthopedic surgery was consulted and she was admitted for right hip cellulitis. ID was also consulted during hospitalization. She was treated with IV antibiotics which were ultimately transitioned to Augmentin and doxycycline to complete course.  Assessment and Plan:  Sepsis secondary to Cellulitis of the Right Hip - presented with 2 to 3 days of progressive right hip erythema with associated tenderness, increased warmth to touch as well as swelling -CT was notable for possible thick fluid collection of unclear significance.  This was reviewed by orthopedic surgery and not felt to be related to the joint space and may be an old fluid collection.  Aspiration with IR was not recommended -ID also following and patient remained on cefepime and daptomycin -Continue on ciprofloxacin and doxycycline until completion date of 08/01/2022 per ID   Noncardiac CP - resolved  -She has had 3 to 4 days of bilateral anterior chest wall discomfort which is worse with palpation in addition to being exacerbated with rotational movement of the upper torso which is likely suggestive of musculoskeletal involvement -CTA of the chest showed no evidence of acute processes and did show  cardiomegaly with small pericardial effusion and three-vessel coronary atherosclerosis as well as small bilateral pleural effusions with adjacent atelectasis in the lower lobes and aortic atherosclerosis -Pain was felt to be nonexertional and appeared atypical for ACS -EKG was nonacute and showed no ischemic  changes and no evidence of ST elevation -Troponin trend negative   Acute respiratory failure with hypoxia - resolved - not on chronic home O2 -Successfully weaned off of oxygen prior to discharge   Chronic Diastolic CHF -BNP was [id number] - no s/s exacerbation    CKD Stage 3a Metabolic Acidosis -IV fluid hydration has been discontinued - renal fxn baseline    Hyponatremia, Hypokalemia, Hypocalciemia  - replete as needed   Normocytic Anemia -Checked Anemia Panel and showed an iron level of 15, UIBC of 132, TIBC of 147, saturation ratios of 10%, ferritin level of 578, folate of 11.3, vitamin B12 738 -Continue monitor for signs and symptoms of bleeding; no overt bleeding noted   GERD Moderate sized hiatal hernia -Continue PPI   Acquired Hypothyroidism -Resume prior regimen of Synthroid   Hypoalbuminemia - continue diet   Morbid Obesity Obstructive Sleep Apnea -Continue CPAP - Complicates overall prognosis and care - Body mass index is 47.33 kg/m.   Physical Debility -Wheelchair bound and in a facility -Likely at her functional baseline so no indication to have PT/OT to evaluate   Principal Diagnosis: Cellulitis of right hip  Discharge Diagnoses: Active Hospital Problems   Diagnosis Date Noted   Cellulitis of right hip 07/03/2022   Atypical chest pain 07/03/2022   Chronic diastolic CHF (congestive heart failure) ([place]) 07/03/2022   Sepsis ([place]) 02/05/2021   Acquired hypothyroidism 02/05/2021   Stage 3a chronic kidney disease (CKD) ([place]) 01/30/2019   Obstructive sleep apnea    GERD (gastroesophageal reflux disease)     Resolved Hospital Problems  No resolved problems to display.     Discharge Instructions     Diet general   Complete by: As directed    Increase activity slowly   Complete by: As directed    No wound care   Complete by: As directed       Allergies as of 07/07/2022       Reactions   Keflex [cephalexin] Diarrhea   Macrobid  [nitrofurantoin] Hives   Amoxicillin Rash        Medication List     TAKE these medications    acetaminophen 325 MG tablet Commonly known as: TYLENOL Take 650 mg by mouth every 6 (six) hours as needed.   albuterol 108 (90 Base) MCG/ACT inhaler Commonly known as: VENTOLIN HFA Inhale 2 puffs into the lungs every 6 (six) hours as needed for wheezing or shortness of breath.   bifidobacterium infantis capsule Take 1 capsule by mouth daily.   CALCIUM PLUS VITAMIN D3 PO Take 1 tablet by mouth daily. Vitamin D3 19mg + Calcium 600   ferrous sulfate 325 (65 FE) MG EC tablet Take 325 mg by mouth. Once A Day on Mon, Thu   furosemide 40 MG tablet Commonly known as: LASIX Take 40 mg by mouth 2 (two) times daily.   gabapentin 300 MG capsule Commonly known as: NEURONTIN Take 300 mg by mouth at bedtime.   levothyroxine 25 MCG tablet Commonly known as: SYNTHROID Take 25 mcg by mouth. one time a day every 2 day(s)   levothyroxine 25 MCG tablet Commonly known as: SYNTHROID Take 50 mcg by mouth. one time a day every 2 day(s)   loperamide 2 MG  capsule Commonly known as: IMODIUM Take 2 mg by mouth every 4 (four) hours as needed.   mineral oil-hydrophilic petrolatum ointment Apply 1 Application topically as needed for dry skin. Apply to (L) arm &Chest topically two times a day for Itching;Rash related to ALLERGIC CONTACT DERMATITIS DU   mirtazapine 7.5 MG tablet Commonly known as: REMERON Take 7.5 mg by mouth at bedtime.   nystatin powder Commonly known as: MYCOSTATIN/NYSTOP Apply 1 application  topically in the morning, at noon, in the evening, and at bedtime. To genital areas and inner thigh   omeprazole 20 MG capsule Commonly known as: PRILOSEC Take 20 mg by mouth daily.   ondansetron 4 MG disintegrating tablet Commonly known as: ZOFRAN-ODT Take 4 mg by mouth every 8 (eight) hours as needed.   OZEMPIC (0.25 OR 0.5 MG/DOSE) [place] Inject 0.25 mg into the skin once a week.    OZEMPIC (1 MG/DOSE) [place] Inject 1 mg into the skin once a week.   OZEMPIC (2 MG/DOSE)  Inject 2 mg into the skin once a week.   potassium chloride SA 20 MEQ tablet Commonly known as: KLOR-CON M Take 20 mEq by mouth daily.   Vitamin D (Ergocalciferol) 50 MCG (2000 UT) Caps Take 2 capsules by mouth daily.        Allergies  Allergen Reactions   Keflex [Cephalexin] Diarrhea   Macrobid [Nitrofurantoin] Hives   Amoxicillin Rash    Consultations: ID Orthopedic surgery  Procedures:   Discharge Exam: BP (!) 124/54 (BP Location: Left Arm)   Pulse 77   Temp 97.6 F (36.4 C) (Oral)   Resp 20   Ht '5\' 8"'$  (1.727 m)   Wt (!) 145.5 kg   SpO2 97%   BMI 48.77 kg/m  Physical Exam Constitutional:      General: She is not in acute distress.    Appearance: Normal appearance. She is obese. She is not ill-appearing.  HENT:     Head: Normocephalic and atraumatic.     Mouth/Throat:     Mouth: Mucous membranes are moist.  Eyes:     Extraocular Movements: Extraocular movements intact.  Cardiovascular:     Rate and Rhythm: Normal rate and regular rhythm.  Pulmonary:     Effort: Pulmonary effort is normal. No respiratory distress.     Breath sounds: Normal breath sounds. No wheezing.  Abdominal:     General: Bowel sounds are normal. There is no distension.     Palpations: Abdomen is soft.     Tenderness: There is no abdominal tenderness.  Musculoskeletal:        General: Swelling present.     Cervical back: Normal range of motion and neck supple.     Comments: R hip noted with induration and erythema, minimal TTP  Skin:    General: Skin is warm and dry.  Neurological:     General: No focal deficit present.     Mental Status: She is alert.  Psychiatric:        Mood and Affect: Mood normal.      The results of significant diagnostics from this hospitalization (including imaging, microbiology, ancillary and laboratory) are listed below for reference.    Microbiology: Recent Results (from the past 240 hour(s))  Resp panel by RT-PCR (RSV, Flu A&B, Covid) Anterior Nasal Swab     Status: None   Collection Time: 07/02/22  6:35 PM   Specimen: Anterior Nasal Swab  Result Value Ref Range Status   SARS Coronavirus 2 by RT  PCR NEGATIVE NEGATIVE Final    Comment: (NOTE) SARS-CoV-2 target nucleic acids are NOT DETECTED.  The SARS-CoV-2 RNA is generally detectable in upper respiratory specimens during the acute phase of infection. The lowest concentration of SARS-CoV-2 viral copies this assay can detect is 138 copies/mL. A negative result does not preclude SARS-Cov-2 infection and should not be used as the sole basis for treatment or other patient management decisions. A negative result may occur with  improper specimen collection/handling, submission of specimen other than nasopharyngeal swab, presence of viral mutation(s) within the areas targeted by this assay, and inadequate number of viral copies(<138 copies/mL). A negative result must be combined with clinical observations, patient history, and epidemiological information. The expected result is Negative.  Fact Sheet for Patients:  EntrepreneurPulse.com.au  Fact Sheet for Healthcare Providers:  IncredibleEmployment.be  This test is no t yet approved or cleared by the [place] FDA and  has been authorized for detection and/or diagnosis of SARS-CoV-2 by FDA under an Emergency Use Authorization (EUA). This EUA will remain  in effect (meaning this test can be used) for the duration of the COVID-19 declaration under Section 564(b)(1) of the Act, 21 U.S.C.section 360bbb-3(b)(1), unless the authorization is terminated  or revoked sooner.       Influenza A by PCR NEGATIVE NEGATIVE Final   Influenza B by PCR NEGATIVE NEGATIVE Final    Comment: (NOTE) The Xpert Xpress SARS-CoV-2/FLU/RSV plus assay is intended as an aid in the diagnosis of influenza  from Nasopharyngeal swab specimens and should not be used as a sole basis for treatment. Nasal washings and aspirates are unacceptable for Xpert Xpress SARS-CoV-2/FLU/RSV testing.  Fact Sheet for Patients: EntrepreneurPulse.com.au  Fact Sheet for Healthcare Providers: IncredibleEmployment.be  This test is not yet approved or cleared by the [place] FDA and has been authorized for detection and/or diagnosis of SARS-CoV-2 by FDA under an Emergency Use Authorization (EUA). This EUA will remain in effect (meaning this test can be used) for the duration of the COVID-19 declaration under Section 564(b)(1) of the Act, 21 U.S.C. section 360bbb-3(b)(1), unless the authorization is terminated or revoked.     Resp Syncytial Virus by PCR NEGATIVE NEGATIVE Final    Comment: (NOTE) Fact Sheet for Patients: EntrepreneurPulse.com.au  Fact Sheet for Healthcare Providers: IncredibleEmployment.be  This test is not yet approved or cleared by the [place] FDA and has been authorized for detection and/or diagnosis of SARS-CoV-2 by FDA under an Emergency Use Authorization (EUA). This EUA will remain in effect (meaning this test can be used) for the duration of the COVID-19 declaration under Section 564(b)(1) of the Act, 21 U.S.C. section 360bbb-3(b)(1), unless the authorization is terminated or revoked.  Performed at [facility], [place] [address] [place], [place] [address]   Culture, blood (routine x 2)     Status: None (Preliminary result)   Collection Time: 07/02/22 10:00 PM   Specimen: BLOOD  Result Value Ref Range Status   Specimen Description   Final    BLOOD SITE NOT SPECIFIED Performed at [facility], [place] [address], [place], [place] [address]    Special Requests   Final    BOTTLES DRAWN AEROBIC AND ANAEROBIC Blood Culture results may not be optimal due to an inadequate volume of  blood received in culture bottles Performed at [place] [address] [place], [place] [address]    Culture   Final    NO GROWTH 4 DAYS Performed at [facility], [address], [place], [place] [address]    Report Status PENDING  Incomplete  Culture, blood (routine x 2)     Status: None (Preliminary result)   Collection Time: 07/02/22 10:15 PM   Specimen: BLOOD  Result Value Ref Range Status   Specimen Description   Final    BLOOD BLOOD RIGHT HAND Performed at [place] [address] [place], [place] [address]    Special Requests   Final    BOTTLES DRAWN AEROBIC AND ANAEROBIC Blood Culture results may not be optimal due to an excessive volume of blood received in culture bottles Performed at [place] [address] [place], [place] [address]    Culture   Final    NO GROWTH 4 DAYS Performed at [facility], [place] [address], [place], [place] [address]    Report Status PENDING  Incomplete  MRSA Next Gen by PCR, Nasal     Status: None   Collection Time: 07/03/22  5:10 PM   Specimen: Nasal Mucosa; Nasal Swab  Result Value Ref Range Status   MRSA by PCR Next Gen NOT DETECTED NOT DETECTED Final    Comment: (NOTE) The GeneXpert MRSA Assay (FDA approved for NASAL specimens only), is one component of a comprehensive MRSA colonization surveillance program. It is not intended to diagnose MRSA infection nor to guide or monitor treatment for MRSA infections. Test performance is not FDA approved in patients less than 56 years old. Performed at [facility], [place] [address] [place],  [address]      Labs: BNP (last 3 results) Recent Labs    07/02/22 1835  BNP A999333*   Basic Metabolic Panel: Recent Labs  Lab 07/03/22 0500 07/03/22 0826 07/04/22 0502 07/05/22 0538 07/06/22 0702  NA 122* 131* 134* 135 136  K 2.7* 3.4* 4.2 3.7 3.4*  CL 89* 103 104 106 109  CO2 17*  21* 20* 19* 19*  GLUCOSE 94 107* 96 94 106*  BUN 16 22 25* 24* 20  CREATININE 0.78 1.12* 1.21* 1.14* 1.01*  CALCIUM 6.1* 8.0* 8.2* 8.4* 8.4*  MG 1.5* 2.1 2.4 2.4 2.4  PHOS  --  3.3 3.6 4.3 3.6   Liver Function Tests: Recent Labs  Lab 07/03/22 0500 07/03/22 0826 07/04/22 0502 07/05/22 0538 07/06/22 0702  AST '17 24 23 30 '$ 36  ALT '13 19 17 21 25  '$ ALKPHOS 58 88 71 66 63  BILITOT 5.2* 0.7 0.6 0.6 0.3  PROT 4.6* 6.4* 6.2* 6.3* 6.4*  ALBUMIN 1.6* 2.2* 2.2* 2.2* 2.3*   No results for input(s): "LIPASE", "AMYLASE" in the last 168 hours. No results for input(s): "AMMONIA" in the last 168 hours. CBC: Recent Labs  Lab 07/02/22 1835 07/03/22 0500 07/04/22 0502 07/05/22 0538 07/06/22 0702  WBC 19.7* 11.9* 12.6* 11.7* 11.9*  NEUTROABS 17.5* 10.4* 10.0* 9.5* 9.5*  HGB 12.7 9.3* 10.9* 11.4* 10.8*  HCT 41.1 31.8* 36.4 38.5 37.0  MCV 88.8 94.1 91.0 92.3 91.8  PLT 455* 326 475* 507* 536*   Cardiac Enzymes: Recent Labs  Lab 07/06/22 0702  CKTOTAL 12*   BNP: Invalid input(s): "POCBNP" CBG: Recent Labs  Lab 07/04/22 1320  GLUCAP 96   D-Dimer No results for input(s): "DDIMER" in the last 72 hours. Hgb A1c No results for input(s): "HGBA1C" in the last 72 hours. Lipid Profile No results for input(s): "CHOL", "HDL", "LDLCALC", "TRIG", "CHOLHDL", "LDLDIRECT" in the last 72 hours. Thyroid function studies No results for  input(s): "TSH", "T4TOTAL", "T3FREE", "THYROIDAB" in the last 72 hours.  Invalid input(s): "FREET3" Anemia work up No results for input(s): "VITAMINB12", "FOLATE", "FERRITIN", "TIBC", "IRON", "RETICCTPCT" in the last 72 hours. Urinalysis    Component Value Date/Time   COLORURINE YELLOW 07/03/2022 0513   APPEARANCEUR CLEAR 07/03/2022 0513   LABSPEC 1.025 07/03/2022 0513   PHURINE 5.0 07/03/2022 0513   GLUCOSEU NEGATIVE 07/03/2022 0513   HGBUR NEGATIVE 07/03/2022 0513   BILIRUBINUR NEGATIVE 07/03/2022 0513   KETONESUR NEGATIVE 07/03/2022 0513   PROTEINUR  30 (A) 07/03/2022 0513   UROBILINOGEN 0.2 12/30/2009 1134   NITRITE NEGATIVE 07/03/2022 0513   LEUKOCYTESUR NEGATIVE 07/03/2022 0513   Sepsis Labs Recent Labs  Lab 07/03/22 0500 07/04/22 0502 07/05/22 0538 07/06/22 0702  WBC 11.9* 12.6* 11.7* 11.9*   Microbiology Recent Results (from the past 240 hour(s))  Resp panel by RT-PCR (RSV, Flu A&B, Covid) Anterior Nasal Swab     Status: None   Collection Time: 07/02/22  6:35 PM   Specimen: Anterior Nasal Swab  Result Value Ref Range Status   SARS Coronavirus 2 by RT PCR NEGATIVE NEGATIVE Final    Comment: (NOTE) SARS-CoV-2 target nucleic acids are NOT DETECTED.  The SARS-CoV-2 RNA is generally detectable in upper respiratory specimens during the acute phase of infection. The lowest concentration of SARS-CoV-2 viral copies this assay can detect is 138 copies/mL. A negative result does not preclude SARS-Cov-2 infection and should not be used as the sole basis for treatment or other patient management decisions. A negative result may occur with  improper specimen collection/handling, submission of specimen other than nasopharyngeal swab, presence of viral mutation(s) within the areas targeted by this assay, and inadequate number of viral copies(<138 copies/mL). A negative result must be combined with clinical observations, patient history, and epidemiological information. The expected result is Negative.  Fact Sheet for Patients:  EntrepreneurPulse.com.au  Fact Sheet for Healthcare Providers:  IncredibleEmployment.be  This test is no t yet approved or cleared by the [place] FDA and  has been authorized for detection and/or diagnosis of SARS-CoV-2 by FDA under an Emergency Use Authorization (EUA). This EUA will remain  in effect (meaning this test can be used) for the duration of the COVID-19 declaration under Section 564(b)(1) of the Act, 21 U.S.C.section 360bbb-3(b)(1), unless the  authorization is terminated  or revoked sooner.       Influenza A by PCR NEGATIVE NEGATIVE Final   Influenza B by PCR NEGATIVE NEGATIVE Final    Comment: (NOTE) The Xpert Xpress SARS-CoV-2/FLU/RSV plus assay is intended as an aid in the diagnosis of influenza from Nasopharyngeal swab specimens and should not be used as a sole basis for treatment. Nasal washings and aspirates are unacceptable for Xpert Xpress SARS-CoV-2/FLU/RSV testing.  Fact Sheet for Patients: EntrepreneurPulse.com.au  Fact Sheet for Healthcare Providers: IncredibleEmployment.be  This test is not yet approved or cleared by the [place] FDA and has been authorized for detection and/or diagnosis of SARS-CoV-2 by FDA under an Emergency Use Authorization (EUA). This EUA will remain in effect (meaning this test can be used) for the duration of the COVID-19 declaration under Section 564(b)(1) of the Act, 21 U.S.C. section 360bbb-3(b)(1), unless the authorization is terminated or revoked.     Resp Syncytial Virus by PCR NEGATIVE NEGATIVE Final    Comment: (NOTE) Fact Sheet for Patients: EntrepreneurPulse.com.au  Fact Sheet for Healthcare Providers: IncredibleEmployment.be  This test is not yet approved or cleared by the [place] and has been authorized  for detection and/or diagnosis of SARS-CoV-2 by FDA under an Emergency Use Authorization (EUA). This EUA will remain in effect (meaning this test can be used) for the duration of the COVID-19 declaration under Section 564(b)(1) of the Act, 21 U.S.C. section 360bbb-3(b)(1), unless the authorization is terminated or revoked.  Performed at [facility], [place] [address] [place], [place] [address]   Culture, blood (routine x 2)     Status: None (Preliminary result)   Collection Time: 07/02/22 10:00 PM   Specimen: BLOOD  Result Value Ref Range Status   Specimen  Description   Final    BLOOD SITE NOT SPECIFIED Performed at [facility], [place] [address], [place], [place] [address]    Special Requests   Final    BOTTLES DRAWN AEROBIC AND ANAEROBIC Blood Culture results may not be optimal due to an inadequate volume of blood received in culture bottles Performed at [place] [address], [place], [place] [address]    Culture   Final    NO GROWTH 4 DAYS Performed at [facility], [place] [address], [place], [place] [address]    Report Status PENDING  Incomplete  Culture, blood (routine x 2)     Status: None (Preliminary result)   Collection Time: 07/02/22 10:15 PM   Specimen: BLOOD  Result Value Ref Range Status   Specimen Description   Final    BLOOD BLOOD RIGHT HAND Performed at [place] [address], [place], [place] [address]    Special Requests   Final    BOTTLES DRAWN AEROBIC AND ANAEROBIC Blood Culture results may not be optimal due to an excessive volume of blood received in culture bottles Performed at [place] [address] [place], [place] [address]    Culture   Final    NO GROWTH 4 DAYS Performed at [facility], [place] [address], [place], [place] [address]    Report Status PENDING  Incomplete  MRSA Next Gen by PCR, Nasal     Status: None   Collection Time: 07/03/22  5:10 PM   Specimen: Nasal Mucosa; Nasal Swab  Result Value Ref Range Status   MRSA by PCR Next Gen NOT DETECTED NOT DETECTED Final    Comment: (NOTE) The GeneXpert MRSA Assay (FDA approved for NASAL specimens only), is one component of a comprehensive MRSA colonization surveillance program. It is not intended to diagnose MRSA infection nor to guide or monitor treatment for MRSA infections. Test performance is not FDA approved in patients less than 22 years old. Performed at [facility], [place] [address] [place], [place] [address]      Procedures/Studies: DG CHEST PORT 1 VIEW  Result Date: 07/06/2022 CLINICAL DATA:  Shortness of breath. EXAM: PORTABLE CHEST 1 VIEW COMPARISON:  None of 07/04/2022 FINDINGS: Stable cardiomediastinal contours. Lung volumes are low. Mild scratch set increase interstitial markings are identified bilaterally concerning for pulmonary edema. Cannot exclude small pleural effusions. Decreased aeration to the left base may represent atelectasis or airspace disease. IMPRESSION: 1. Suspect mild congestive heart failure. 2. Decreased aeration to the left base may represent atelectasis or airspace disease. Electronically Signed   By: Kerby Moors M.D.   On: 07/06/2022 08:05   DG CHEST PORT 1 VIEW  Result Date: 07/04/2022 CLINICAL DATA:  Shortness of breath. EXAM: PORTABLE CHEST 1 VIEW COMPARISON:  07/03/2018 for FINDINGS: Stable cardiomediastinal contours. Low lung volumes. Unchanged left base atelectasis.  No signs of pleural effusion, interstitial edema or consolidation. IMPRESSION: No change in aeration to the lungs compared with previous exam. Electronically Signed   By: Kerby Moors M.D.   On: 07/04/2022 08:18   DG CHEST PORT 1 VIEW  Result Date: 07/03/2022 CLINICAL DATA:  Shortness of breath. EXAM: PORTABLE CHEST 1 VIEW COMPARISON:  07/02/2022 and prior studies FINDINGS: This is a low volume study. The cardiomediastinal silhouette is unchanged. Mild LEFT basilar opacities are noted. There is no evidence of pneumothorax or pleural effusion. No acute bony abnormalities are noted. IMPRESSION: Mild LEFT basilar opacities, favor atelectasis but infection/pneumonia is not excluded. Electronically Signed   By: Margarette Canada M.D.   On: 07/03/2022 16:30   CT Hip Right Wo Contrast  Result Date: 07/02/2022 CLINICAL DATA:  Septic arthritis suspected. Hip x-ray done. Hip pain and redness in the right hip. EXAM: CT OF THE RIGHT HIP WITHOUT CONTRAST TECHNIQUE: Multidetector CT imaging of the right hip was performed  according to the standard protocol. Multiplanar CT image reconstructions were also generated. RADIATION DOSE REDUCTION: This exam was performed according to the departmental dose-optimization program which includes automated exposure control, adjustment of the mA and/or kV according to patient size and/or use of iterative reconstruction technique. 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 Increased callus formation about the acetabulum with redemonstrated fragmentation along the medial aspect of the acetabulum. Unchanged fragmentation of the right greater trochanter. MR nondisplaced longitudinal fracture and osseous fragmentation about the proximal femoral diaphysis. The proximal end of the femoral diaphysis has shifted anteriorly compared to 06/08/2021. Unchanged mixed soft tissue and fluid components at the site of the prior femoral head and neck component compatible with granulation tissue. There may be a central fluid component/seroma though this is similar to decreased from 06/08/2021. Ligaments Suboptimally assessed by CT. Muscles and Tendons Muscle atrophy of the gluteal muscles. Soft tissues Similar postsurgical scarring in the subcutaneous fat anterior to the right hip. Increased size of the thick-walled fluid collection in the lateral right subcutaneous fat extending medially to the greater trochanter fragmentation. This is not completely visualized on this exam and extends laterally beyond the lateral aspect of the scan. Suggestion of adjacent stranding lateral to the fluid collection though this is largely excluded from the field-of-view. IMPRESSION: 1. 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 There is some interval healing change since 06/08/2021. 2. Unchanged granulation tissue and possible central seroma about the  proximal end of the femoral diaphysis and acetabulum. 3. Increased size of the thick-walled fluid collection in the lateral right subcutaneous fat extending medially to the greater trochanter fragmentation. This is not completely visualized on this exam and extends laterally beyond the field-of-view. There is the suggestion of subcutaneous stranding lateral to the fluid collection. Clinical correlation is recommended to exclude cellulitis. If there is concern for abscess, consider further evaluation with ultrasound and/or fluid sampling. It is unclear if the large subcutaneous fluid collection communicates with the joint space. Electronically Signed   By: Placido Sou M.D.   On: 07/02/2022 23:43   DG Hip Unilat With Pelvis 2-3 Views Right  Result Date: 07/02/2022 CLINICAL DATA:  Hip pain with redness and swelling to the right hip EXAM: DG HIP (WITH OR WITHOUT PELVIS) 2-3V RIGHT COMPARISON:  CT of the right hip 06/08/2021 FINDINGS: Findings appear similar to CT 06/08/2021 given differences in technique. Changes from prior right  hip arthroplasty with subsequent hardware removal. Fragmentation of the right greater trochanter. Cortical thickening and sclerosis about the proximal femoral shaft and acetabulum. No definite acute fracture. The soft tissue thickening and fluid about the right hip was better demonstrated on CT. Contrast within the bladder IMPRESSION: No definite acute fracture. Similar changes about the right hip from right hip arthroplasty and subsequent heart were removal when compared with CT 06/08/2021. Electronically Signed   By: Placido Sou M.D.   On: 07/02/2022 22:01   CT Angio Chest PE W/Cm &/Or Wo Cm  Result Date: 07/02/2022 CLINICAL DATA:  Pulmonary embolism suspected.  High probability. EXAM: CT ANGIOGRAPHY CHEST WITH CONTRAST TECHNIQUE: Multidetector CT imaging of the chest was performed using the standard protocol during bolus administration of intravenous contrast. Multiplanar CT  image reconstructions and MIPs were obtained to evaluate the vascular anatomy. RADIATION DOSE REDUCTION: This exam was performed according to the departmental dose-optimization program which includes automated exposure control, adjustment of the mA and/or kV according to patient size and/or use of iterative reconstruction technique. CONTRAST:  67m OMNIPAQUE IOHEXOL 350 MG/ML SOLN COMPARISON:  Portable chest today, portable chest 10/24/2020. FINDINGS: Cardiovascular: There is mild cardiomegaly and small pericardial effusion. There are scattered three-vessel coronary artery calcifications. The pulmonary veins are normal caliber. The pulmonary arteries are normal caliber and clear through the segmental divisions. Due to breathing motion, the subsegmental arterial bed is obscured and not evaluated. There is mild aortic atherosclerosis and tortuosity without aneurysm, stenosis or dissection. The great vessels are clear. Mediastinum/Nodes: There is a moderate-sized hiatal hernia with gastroesophageal reflux to the level of the aortic arch. The esophageal thickness is normal. The trachea and main bronchi are patent. There are minimal retained secretions in the right main bronchus. There is no intrathoracic or axillary adenopathy. Axillary spaces are clear. Normal thyroid. Lungs/Pleura: There small symmetric bilateral layering pleural effusions and adjacent coarse atelectatic markings in the bilateral lower lobes. There is mild posterior atelectasis in the upper lung fields. Reticulated scarring noted both apices. No focal infiltrate or nodule is seen through the breathing motion. There is no pulmonary edema. Upper Abdomen: Status post cholecystectomy. No biliary dilatation. Moderate hepatic steatosis. No acute upper abdominal findings. Musculoskeletal: There is thoracic kyphosis, osteopenia, spondylosis and multilevel degenerative discs, multilevel bridging enthesopathy. Bilateral acromiohumeral abutment consistent with  chronic rotator cuff arthropathy and degenerative tears is also noted with bilateral supraspinatus fatty atrophy. No acute or other significant musculoskeletal findings. Review of the MIP images confirms the above findings. IMPRESSION: 1. No evidence of arterial dilatation through the segmental divisions. The subsegmental arterial bed is obscured by breathing motion. 2. Cardiomegaly with small pericardial effusion and three-vessel coronary atherosclerosis. 3. Small bilateral pleural effusions with adjacent atelectasis in the lower lobes. 4. Moderate-sized hiatal hernia with gastroesophageal reflux to the level of the aortic arch. Aspiration precautions may be indicated. 5. Aortic atherosclerosis. 6. Moderate hepatic steatosis. 7. Osteopenia, kyphosis and degenerative change. 8. 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 Aortic Atherosclerosis (ICD10-I70.0). Electronically Signed   By: KTelford NabM.D.   On: 07/02/2022 21:32   DG Chest Port 1 View  Result Date: 07/02/2022 CLINICAL DATA:  Sternal chest pain, worse with movement EXAM: PORTABLE CHEST 1 VIEW COMPARISON:  Chest radiograph dated 10/24/2020 FINDINGS: Low lung volumes with bronchovascular crowding. Bibasilar and bilateral perihilar patchy opacities. 0w0 No pleural effusion or pneumothorax. The heart size and mediastinal contours are within normal limits. The visualized skeletal structures are unremarkable.  IMPRESSION: 1. Low lung volumes with bronchovascular crowding. 2. Bibasilar and bilateral perihilar patchy opacities, which could be due to atelectasis or infection. Electronically Signed   By: Darrin Nipper M.D.   On: 07/02/2022 19:08     Time coordinating discharge: Over 30 minutes    Dwyane Dee, MD  [facility] 07/07/2022, 1:45 PM

## 2022-07-07 NOTE — Progress Notes (Signed)
Report called to [place].  All questions answered and pt accepted, [facility] called for transport.

## 2022-07-08 ENCOUNTER — Emergency Department (HOSPITAL_COMMUNITY): Payer: Medicare Other

## 2022-07-08 ENCOUNTER — Encounter: Payer: Self-pay | Admitting: Nurse Practitioner

## 2022-07-08 ENCOUNTER — Other Ambulatory Visit: Payer: Self-pay

## 2022-07-08 ENCOUNTER — Non-Acute Institutional Stay (SKILLED_NURSING_FACILITY): Payer: Medicare Other | Admitting: Nurse Practitioner

## 2022-07-08 ENCOUNTER — Encounter (HOSPITAL_COMMUNITY): Payer: Self-pay | Admitting: Internal Medicine

## 2022-07-08 ENCOUNTER — Inpatient Hospital Stay (HOSPITAL_COMMUNITY)
Admission: EM | Admit: 2022-07-08 | Discharge: 2022-07-14 | DRG: 291 | Disposition: A | Discharge status: Home / Self Care | Payer: Medicare Other | Attending: Internal Medicine | Admitting: Internal Medicine

## 2022-07-08 DIAGNOSIS — Z96641 Presence of right artificial hip joint: Secondary | ICD-10-CM | POA: Diagnosis present

## 2022-07-08 DIAGNOSIS — E66813 Obesity, class 3: Secondary | ICD-10-CM | POA: Diagnosis present

## 2022-07-08 DIAGNOSIS — E876 Hypokalemia: Secondary | ICD-10-CM | POA: Diagnosis present

## 2022-07-08 DIAGNOSIS — R635 Abnormal weight gain: Secondary | ICD-10-CM | POA: Diagnosis not present

## 2022-07-08 DIAGNOSIS — Z6841 Body Mass Index (BMI) 40.0 and over, adult: Secondary | ICD-10-CM

## 2022-07-08 DIAGNOSIS — Z881 Allergy status to other antibiotic agents status: Secondary | ICD-10-CM

## 2022-07-08 DIAGNOSIS — J189 Pneumonia, unspecified organism: Secondary | ICD-10-CM

## 2022-07-08 DIAGNOSIS — Z7985 Long-term (current) use of injectable non-insulin antidiabetic drugs: Secondary | ICD-10-CM | POA: Diagnosis not present

## 2022-07-08 DIAGNOSIS — D649 Anemia, unspecified: Secondary | ICD-10-CM | POA: Diagnosis present

## 2022-07-08 DIAGNOSIS — Z792 Long term (current) use of antibiotics: Secondary | ICD-10-CM

## 2022-07-08 DIAGNOSIS — K2100 Gastro-esophageal reflux disease with esophagitis, without bleeding: Secondary | ICD-10-CM

## 2022-07-08 DIAGNOSIS — J9601 Acute respiratory failure with hypoxia: Secondary | ICD-10-CM | POA: Diagnosis present

## 2022-07-08 DIAGNOSIS — Z96653 Presence of artificial knee joint, bilateral: Secondary | ICD-10-CM | POA: Diagnosis present

## 2022-07-08 DIAGNOSIS — L03115 Cellulitis of right lower limb: Secondary | ICD-10-CM | POA: Diagnosis present

## 2022-07-08 DIAGNOSIS — Z79899 Other long term (current) drug therapy: Secondary | ICD-10-CM | POA: Diagnosis not present

## 2022-07-08 DIAGNOSIS — R0609 Other forms of dyspnea: Secondary | ICD-10-CM | POA: Diagnosis not present

## 2022-07-08 DIAGNOSIS — I130 Hypertensive heart and chronic kidney disease with heart failure and stage 1 through stage 4 chronic kidney disease, or unspecified chronic kidney disease: Secondary | ICD-10-CM | POA: Diagnosis present

## 2022-07-08 DIAGNOSIS — E871 Hypo-osmolality and hyponatremia: Secondary | ICD-10-CM | POA: Diagnosis present

## 2022-07-08 DIAGNOSIS — N1831 Chronic kidney disease, stage 3a: Secondary | ICD-10-CM | POA: Diagnosis present

## 2022-07-08 DIAGNOSIS — L03116 Cellulitis of left lower limb: Secondary | ICD-10-CM | POA: Diagnosis present

## 2022-07-08 DIAGNOSIS — Z1152 Encounter for screening for COVID-19: Secondary | ICD-10-CM

## 2022-07-08 DIAGNOSIS — Z79890 Hormone replacement therapy (postmenopausal): Secondary | ICD-10-CM | POA: Diagnosis not present

## 2022-07-08 DIAGNOSIS — D631 Anemia in chronic kidney disease: Secondary | ICD-10-CM | POA: Diagnosis present

## 2022-07-08 DIAGNOSIS — I509 Heart failure, unspecified: Secondary | ICD-10-CM | POA: Diagnosis present

## 2022-07-08 DIAGNOSIS — D72829 Elevated white blood cell count, unspecified: Secondary | ICD-10-CM | POA: Insufficient documentation

## 2022-07-08 DIAGNOSIS — K224 Dyskinesia of esophagus: Secondary | ICD-10-CM | POA: Diagnosis present

## 2022-07-08 DIAGNOSIS — G4733 Obstructive sleep apnea (adult) (pediatric): Secondary | ICD-10-CM

## 2022-07-08 DIAGNOSIS — I5033 Acute on chronic diastolic (congestive) heart failure: Secondary | ICD-10-CM | POA: Diagnosis present

## 2022-07-08 DIAGNOSIS — Z89621 Acquired absence of right hip joint: Secondary | ICD-10-CM

## 2022-07-08 DIAGNOSIS — I5032 Chronic diastolic (congestive) heart failure: Secondary | ICD-10-CM

## 2022-07-08 DIAGNOSIS — E039 Hypothyroidism, unspecified: Secondary | ICD-10-CM | POA: Diagnosis present

## 2022-07-08 DIAGNOSIS — Z8249 Family history of ischemic heart disease and other diseases of the circulatory system: Secondary | ICD-10-CM

## 2022-07-08 DIAGNOSIS — Z87891 Personal history of nicotine dependence: Secondary | ICD-10-CM | POA: Diagnosis not present

## 2022-07-08 DIAGNOSIS — K219 Gastro-esophageal reflux disease without esophagitis: Secondary | ICD-10-CM | POA: Diagnosis present

## 2022-07-08 DIAGNOSIS — I10 Essential (primary) hypertension: Secondary | ICD-10-CM | POA: Diagnosis not present

## 2022-07-08 DIAGNOSIS — D500 Iron deficiency anemia secondary to blood loss (chronic): Secondary | ICD-10-CM

## 2022-07-08 DIAGNOSIS — Z880 Allergy status to penicillin: Secondary | ICD-10-CM

## 2022-07-08 DIAGNOSIS — N184 Chronic kidney disease, stage 4 (severe): Secondary | ICD-10-CM | POA: Diagnosis present

## 2022-07-08 LAB — CBC
HCT: 39.6 % (ref 36.0–46.0)
Hemoglobin: 11.8 g/dL — ABNORMAL LOW (ref 12.0–15.0)
MCH: 27.3 pg (ref 26.0–34.0)
MCHC: 29.8 g/dL — ABNORMAL LOW (ref 30.0–36.0)
MCV: 91.5 fL (ref 80.0–100.0)
Platelets: 590 10*3/uL — ABNORMAL HIGH (ref 150–400)
RBC: 4.33 MIL/uL (ref 3.87–5.11)
RDW: 14.6 % (ref 11.5–15.5)
WBC: 16.7 10*3/uL — ABNORMAL HIGH (ref 4.0–10.5)
nRBC: 0 % (ref 0.0–0.2)

## 2022-07-08 LAB — CBC WITH DIFFERENTIAL/PLATELET
Abs Immature Granulocytes: 0.62 10*3/uL — ABNORMAL HIGH (ref 0.00–0.07)
Basophils Absolute: 0.1 10*3/uL (ref 0.0–0.1)
Basophils Relative: 1 %
Eosinophils Absolute: 0.5 10*3/uL (ref 0.0–0.5)
Eosinophils Relative: 3 %
HCT: 38.5 % (ref 36.0–46.0)
Hemoglobin: 11.7 g/dL — ABNORMAL LOW (ref 12.0–15.0)
Immature Granulocytes: 4 %
Lymphocytes Relative: 6 %
Lymphs Abs: 1 10*3/uL (ref 0.7–4.0)
MCH: 27.4 pg (ref 26.0–34.0)
MCHC: 30.4 g/dL (ref 30.0–36.0)
MCV: 90.2 fL (ref 80.0–100.0)
Monocytes Absolute: 1.3 10*3/uL — ABNORMAL HIGH (ref 0.1–1.0)
Monocytes Relative: 9 %
Neutro Abs: 12.1 10*3/uL — ABNORMAL HIGH (ref 1.7–7.7)
Neutrophils Relative %: 77 %
Platelets: 607 10*3/uL — ABNORMAL HIGH (ref 150–400)
RBC: 4.27 MIL/uL (ref 3.87–5.11)
RDW: 14.5 % (ref 11.5–15.5)
WBC: 15.6 10*3/uL — ABNORMAL HIGH (ref 4.0–10.5)
nRBC: 0 % (ref 0.0–0.2)

## 2022-07-08 LAB — CULTURE, BLOOD (ROUTINE X 2)
Culture: NO GROWTH
Culture: NO GROWTH

## 2022-07-08 LAB — BRAIN NATRIURETIC PEPTIDE: B Natriuretic Peptide: 153.2 pg/mL — ABNORMAL HIGH (ref 0.0–100.0)

## 2022-07-08 LAB — RESP PANEL BY RT-PCR (RSV, FLU A&B, COVID)  RVPGX2
Influenza A by PCR: NEGATIVE
Influenza B by PCR: NEGATIVE
Resp Syncytial Virus by PCR: NEGATIVE
SARS Coronavirus 2 by RT PCR: NEGATIVE

## 2022-07-08 LAB — BASIC METABOLIC PANEL
Anion gap: 6 (ref 5–15)
BUN: 17 mg/dL (ref 8–23)
CO2: 17 mmol/L — ABNORMAL LOW (ref 22–32)
Calcium: 8.2 mg/dL — ABNORMAL LOW (ref 8.9–10.3)
Chloride: 110 mmol/L (ref 98–111)
Creatinine, Ser: 1.18 mg/dL — ABNORMAL HIGH (ref 0.44–1.00)
GFR, Estimated: 46 mL/min — ABNORMAL LOW (ref >60)
Glucose, Bld: 112 mg/dL — ABNORMAL HIGH (ref 70–99)
Potassium: 3.9 mmol/L (ref 3.5–5.1)
Sodium: 133 mmol/L — ABNORMAL LOW (ref 135–145)

## 2022-07-08 LAB — CREATININE, SERUM
Creatinine, Ser: 0.98 mg/dL (ref 0.44–1.00)
GFR, Estimated: 58 mL/min — ABNORMAL LOW (ref >60)

## 2022-07-08 MED ORDER — FUROSEMIDE 10 MG/ML IJ SOLN
60.0000 mg | Freq: Two times a day (BID) | INTRAMUSCULAR | Status: DC
  Administered 2022-07-08 – 2022-07-10 (×5): 60 mg via INTRAVENOUS
  Filled 2022-07-08 (×5): qty 6

## 2022-07-08 MED ORDER — VANCOMYCIN HCL 2000 MG/400ML IV SOLN
2000.0000 mg | Freq: Once | INTRAVENOUS | Status: AC
  Administered 2022-07-08: 2000 mg via INTRAVENOUS
  Filled 2022-07-08: qty 400

## 2022-07-08 MED ORDER — CIPROFLOXACIN HCL 500 MG PO TABS
750.0000 mg | ORAL_TABLET | Freq: Two times a day (BID) | ORAL | Status: DC
  Administered 2022-07-08 – 2022-07-13 (×11): 750 mg via ORAL
  Filled 2022-07-08 (×12): qty 1

## 2022-07-08 MED ORDER — ENOXAPARIN SODIUM 40 MG/0.4ML IJ SOSY
40.0000 mg | PREFILLED_SYRINGE | INTRAMUSCULAR | Status: DC
  Administered 2022-07-09 – 2022-07-10 (×2): 40 mg via SUBCUTANEOUS
  Filled 2022-07-08 (×2): qty 0.4

## 2022-07-08 MED ORDER — FERROUS SULFATE 325 (65 FE) MG PO TABS
325.0000 mg | ORAL_TABLET | Freq: Every day | ORAL | Status: DC
  Administered 2022-07-09 – 2022-07-14 (×6): 325 mg via ORAL
  Filled 2022-07-08 (×6): qty 1

## 2022-07-08 MED ORDER — ALBUTEROL SULFATE HFA 108 (90 BASE) MCG/ACT IN AERS: 2.0000 | INHALATION_SPRAY | Freq: Four times a day (QID) | RESPIRATORY_TRACT | Status: DC | PRN

## 2022-07-08 MED ORDER — ONDANSETRON HCL 4 MG/2ML IJ SOLN
4.0000 mg | Freq: Four times a day (QID) | INTRAMUSCULAR | Status: DC | PRN
  Administered 2022-07-11 – 2022-07-14 (×3): 4 mg via INTRAVENOUS
  Filled 2022-07-08 (×4): qty 2

## 2022-07-08 MED ORDER — VITAMIN D 25 MCG (1000 UNIT) PO TABS
4000.0000 [IU] | ORAL_TABLET | Freq: Every day | ORAL | Status: DC
  Administered 2022-07-09 – 2022-07-14 (×6): 4000 [IU] via ORAL
  Filled 2022-07-08 (×5): qty 4

## 2022-07-08 MED ORDER — VITAMIN D (ERGOCALCIFEROL) 50 MCG (2000 UT) PO CAPS: 2.0000 | ORAL_CAPSULE | Freq: Every day | ORAL | Status: DC

## 2022-07-08 MED ORDER — SODIUM CHLORIDE 0.9 % IV SOLN
2.0000 g | Freq: Once | INTRAVENOUS | Status: AC
  Administered 2022-07-08: 2 g via INTRAVENOUS
  Filled 2022-07-08: qty 12.5

## 2022-07-08 MED ORDER — SPIRONOLACTONE 25 MG PO TABS
25.0000 mg | ORAL_TABLET | Freq: Every day | ORAL | Status: DC
  Administered 2022-07-09 – 2022-07-14 (×6): 25 mg via ORAL
  Filled 2022-07-08 (×6): qty 1

## 2022-07-08 MED ORDER — DOXYCYCLINE HYCLATE 100 MG PO TABS
100.0000 mg | ORAL_TABLET | Freq: Two times a day (BID) | ORAL | Status: DC
  Administered 2022-07-08 – 2022-07-11 (×7): 100 mg via ORAL
  Filled 2022-07-08 (×7): qty 1

## 2022-07-08 MED ORDER — ACETAMINOPHEN 650 MG RE SUPP: 650.0000 mg | Freq: Four times a day (QID) | RECTAL | Status: DC | PRN

## 2022-07-08 MED ORDER — MIRTAZAPINE 15 MG PO TABS
7.5000 mg | ORAL_TABLET | Freq: Every day | ORAL | Status: DC
  Administered 2022-07-08 – 2022-07-13 (×6): 7.5 mg via ORAL
  Filled 2022-07-08 (×6): qty 1

## 2022-07-08 MED ORDER — ALBUTEROL SULFATE (2.5 MG/3ML) 0.083% IN NEBU: 2.5000 mg | INHALATION_SOLUTION | Freq: Four times a day (QID) | RESPIRATORY_TRACT | Status: DC | PRN

## 2022-07-08 MED ORDER — LEVOTHYROXINE SODIUM 50 MCG PO TABS: 50.0000 ug | ORAL_TABLET | Freq: Every day | ORAL | Status: DC

## 2022-07-08 MED ORDER — ACETAMINOPHEN 325 MG PO TABS
650.0000 mg | ORAL_TABLET | Freq: Four times a day (QID) | ORAL | Status: DC | PRN
  Filled 2022-07-08: qty 2

## 2022-07-08 MED ORDER — SODIUM CHLORIDE 0.9 % IV SOLN: Freq: Once | INTRAVENOUS | Status: AC

## 2022-07-08 MED ORDER — PANTOPRAZOLE SODIUM 40 MG PO TBEC
40.0000 mg | DELAYED_RELEASE_TABLET | Freq: Every day | ORAL | Status: DC
  Administered 2022-07-09 – 2022-07-14 (×6): 40 mg via ORAL
  Filled 2022-07-08 (×6): qty 1

## 2022-07-08 MED ORDER — ONDANSETRON HCL 4 MG PO TABS
4.0000 mg | ORAL_TABLET | Freq: Four times a day (QID) | ORAL | Status: DC | PRN
  Filled 2022-07-08: qty 1

## 2022-07-08 MED ORDER — LEVOTHYROXINE SODIUM 25 MCG PO TABS
50.0000 ug | ORAL_TABLET | ORAL | Status: DC
  Administered 2022-07-10 – 2022-07-14 (×3): 50 ug via ORAL
  Filled 2022-07-08 (×3): qty 2

## 2022-07-08 NOTE — Assessment & Plan Note (Signed)
Continue levothyroxine 

## 2022-07-08 NOTE — Assessment & Plan Note (Signed)
stable, on Omeprazole, prn Zofran. Hgb 10.8 07/06/22

## 2022-07-08 NOTE — ED Notes (Signed)
ED TO INPATIENT HANDOFF REPORT  ED Nurse Name and Phone #: Para Cossey    S Name/Age/Gender Madison [name] 82 y.o. female Room/Bed: WA04/WA04  Code Status   Code Status: Full Code  Home/SNF/Other Home Patient oriented to: self, place, time, and situation Is this baseline? Yes   Triage Complete: Triage complete  Chief Complaint Heart failure [facility]) [I50.9]  Triage Note Patient coming from [place] via EMS with c/o pneumonia confirmed by CXR at facility. SOB since this morning. Per EMS left lung sounds rhonchi. Patient has no complaints at this time.   116/70 74 HR 98% 3 L [place] (2.5-3L baseline) 22 RR    Allergies Allergies  Allergen Reactions   Keflex [Cephalexin] Diarrhea   Macrobid [Nitrofurantoin] Hives   Amoxicillin Rash    Level of Care/Admitting Diagnosis ED Disposition     ED Disposition  Admit   Condition  --   Comment  Hospital Area: [place] [id number]  Level of Care: Telemetry [5]  Admit to tele based on following criteria: Acute CHF  May admit patient to [facility] or [facility] if equivalent level of care is available:: No  Covid Evaluation: Confirmed COVID Negative  [facility]: [id number]  Admitting Physician: Tawni Millers [id number]  Attending Physician: Tawni Millers [id number]  Certification:: I certify this patient will need inpatient services for at least 2 midnights  Estimated Length of Stay: 3          B Medical/Surgery History Past Medical History:  Diagnosis Date   Benign hypertension with chronic kidney disease, stage III ([place])    Overview:  Last Assessment & Plan:  Usually the patient is hypertensive. However today her blood pressure is [id number] systolic. She has been feeling fatigued with some lightheadedness. Part of this may be related to her lower blood pressure. Her pressure may be improved with her decrease in salt and fluid intake. I've instructed her to put her  lisinopril/hctz on hold. 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 It was possibly reentrant. She does well with diltiazem. No change in therapy.   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   Thyroid disease    Urinary incontinence    Past Surgical History:  Procedure Laterality Date   CATARACT EXTRACTION Left 8/11   CATARACT EXTRACTION Right 3/12   CHOLECYSTECTOMY  1994   Copsilotomy Laser Treatment Left 6/12   eye   ELBOW SURGERY  8/12   EYE SURGERY Left 09/2008   macular hole   EYE SURGERY Right 12/11   Lumbar Infusion  2011   MOLE  REMOVAL  06/17/2015   TOTAL KNEE ARTHROPLASTY Left 6/11   TOTAL KNEE ARTHROPLASTY Right 06/13/2011     A IV Location/Drains/Wounds Patient Lines/Drains/Airways Status     Active Line/Drains/Airways     Name Placement date Placement time Site Days   Peripheral IV 07/08/22 22 G 1" Right;Posterior Forearm 07/08/22  1736  Forearm  less than 1   External Urinary Catheter 07/03/22  0800  --  5   Wound / Incision (Open or Dehisced) 07/05/22 Thigh Anterior;Right Right Hip Cellulitis 07/05/22  0730  Thigh  3             Intake/Output Last 24 hours No intake or output data in the 24 hours ending 07/08/22 1946  Labs/Imaging Results for orders placed or performed during the hospital encounter of 07/08/22 (from the past 48 hour(s))  Resp panel by RT-PCR (RSV, Flu A&B, Covid) Anterior Nasal Swab     Status: None   Collection Time: 07/08/22  5:10 PM   Specimen: Anterior Nasal Swab  Result Value Ref Range   SARS Coronavirus 2 by RT PCR NEGATIVE NEGATIVE    Comment: (NOTE) SARS-CoV-2 target nucleic acids are NOT DETECTED.  The SARS-CoV-2 RNA is generally detectable in upper respiratory specimens during the acute phase of infection. The lowest concentration of SARS-CoV-2 viral copies this assay can detect is 138 copies/mL. A negative result does not preclude SARS-Cov-2 infection and should not be used as the sole basis for treatment or other patient management decisions. A negative result may occur with  improper specimen collection/handling, submission of specimen other than nasopharyngeal swab, presence of viral mutation(s) within the areas targeted by this assay, and inadequate number of viral copies(<138 copies/mL). A negative result must be combined with clinical observations, patient history, and epidemiological information. The expected result is Negative.  Fact Sheet for Patients:  EntrepreneurPulse.com.au  Fact Sheet for Healthcare Providers:  IncredibleEmployment.be  This test is no t yet approved or cleared by the [place] FDA and  has been authorized for detection and/or diagnosis of SARS-CoV-2 by FDA under an Emergency Use Authorization (EUA). This EUA will remain  in effect (meaning this test can be used) for the duration of the COVID-19 declaration under Section 564(b)(1) of the Act, 21 U.S.C.section 360bbb-3(b)(1), unless the authorization is terminated  or revoked sooner.       Influenza A by PCR NEGATIVE NEGATIVE   Influenza B by PCR  NEGATIVE NEGATIVE    Comment: (NOTE) The Xpert Xpress SARS-CoV-2/FLU/RSV plus assay is intended as an aid in the diagnosis of influenza from Nasopharyngeal swab specimens and should not be used as a sole basis for treatment. Nasal washings and aspirates are unacceptable for Xpert Xpress SARS-CoV-2/FLU/RSV testing.  Fact Sheet for Patients: EntrepreneurPulse.com.au  Fact Sheet for Healthcare Providers: IncredibleEmployment.be  This test is not yet approved or cleared by the [place] FDA and has been authorized for detection and/or diagnosis of SARS-CoV-2 by FDA under an Emergency Use Authorization (EUA). This EUA will remain in effect (meaning this test can be used) for the duration of the COVID-19 declaration under Section 564(b)(1) of the Act, 21 U.S.C. section 360bbb-3(b)(1), unless the authorization is terminated or revoked.     Resp Syncytial Virus by PCR NEGATIVE NEGATIVE    Comment: (NOTE) Fact Sheet for Patients: EntrepreneurPulse.com.au  Fact Sheet for Healthcare Providers: IncredibleEmployment.be  This test is not yet approved or cleared by the [place] FDA and has been authorized for detection and/or diagnosis of SARS-CoV-2 by  FDA under an Emergency Use Authorization (EUA). This EUA will remain in effect (meaning this test can be used) for the duration of the COVID-19 declaration under Section 564(b)(1) of the Act, 21 U.S.C. section 360bbb-3(b)(1), unless the authorization is terminated or revoked.  Performed at [facility], [place] [address], [place], [place] [id number]   Basic metabolic panel     Status: Abnormal   Collection Time: 07/08/22  5:14 PM  Result Value Ref Range   Sodium 133 (L) 135 - 145 mmol/L   Potassium 3.9 3.5 - 5.1 mmol/L   Chloride 110 98 - 111 mmol/L   CO2 17 (L) 22 - 32 mmol/L   Glucose, Bld 112 (H) 70 - 99 mg/dL    Comment: Glucose  reference range applies only to samples taken after fasting for at least 8 hours.   BUN 17 8 - 23 mg/dL   Creatinine, Ser 1.18 (H) 0.44 - 1.00 mg/dL   Calcium 8.2 (L) 8.9 - 10.3 mg/dL   GFR, Estimated 46 (L) >60 mL/min    Comment: (NOTE) Calculated using the CKD-EPI Creatinine Equation (2021)    Anion gap 6 5 - 15    Comment: Performed at [facility], [place] [address] [place], [place] [address]  CBC with Differential     Status: Abnormal   Collection Time: 07/08/22  5:14 PM  Result Value Ref Range   WBC 15.6 (H) 4.0 - 10.5 K/uL   RBC 4.27 3.87 - 5.11 MIL/uL   Hemoglobin 11.7 (L) 12.0 - 15.0 g/dL   HCT 38.5 36.0 - 46.0 %   MCV 90.2 80.0 - 100.0 fL   MCH 27.4 26.0 - 34.0 pg   MCHC 30.4 30.0 - 36.0 g/dL   RDW 14.5 11.5 - 15.5 %   Platelets 607 (H) 150 - 400 K/uL   nRBC 0.0 0.0 - 0.2 %   Neutrophils Relative % 77 %   Neutro Abs 12.1 (H) 1.7 - 7.7 K/uL   Lymphocytes Relative 6 %   Lymphs Abs 1.0 0.7 - 4.0 K/uL   Monocytes Relative 9 %   Monocytes Absolute 1.3 (H) 0.1 - 1.0 K/uL   Eosinophils Relative 3 %   Eosinophils Absolute 0.5 0.0 - 0.5 K/uL   Basophils Relative 1 %   Basophils Absolute 0.1 0.0 - 0.1 K/uL   Immature Granulocytes 4 %   Abs Immature Granulocytes 0.62 (H) 0.00 - 0.07 K/uL    Comment: Performed at [facility], [place] [address], [place], [place] [address]  Brain natriuretic peptide     Status: Abnormal   Collection Time: 07/08/22  5:14 PM  Result Value Ref Range   B Natriuretic Peptide 153.2 (H) 0.0 - 100.0 pg/mL    Comment: Performed at [facility], [place] [address], [place], [place] [address]   DG Chest Portable 1 View  Result Date: 07/08/2022 CLINICAL DATA:  Short of breath, pneumonia EXAM: PORTABLE CHEST 1 VIEW COMPARISON:  07/06/2022 FINDINGS: Single frontal view of the chest demonstrates stable enlargement of the cardiac silhouette. Multifocal bilateral airspace disease greatest in the right upper and  left lower lung zones. Small left effusion is suspected. Continued central vascular congestion. No pneumothorax. No acute bony abnormality. IMPRESSION: 1. Constellation of findings most suggestive of congestive heart failure, with no significant change in volume status since prior study. Underlying pneumonia would be difficult to exclude. Electronically Signed   By: Randa Ngo M.D.   On: 07/08/2022 17:36  Pending Labs [employer] (From admission, onward)     Start     Ordered   Signed and Held  CBC  (enoxaparin (LOVENOX)    CrCl >/= 30 ml/min)  Once,   R       Comments: Baseline for enoxaparin therapy IF NOT ALREADY DRAWN.  Notify MD if PLT < 100 K.    Signed and Held   Signed and Held  Creatinine, serum  (enoxaparin (LOVENOX)    CrCl >/= 30 ml/min)  Once,   R       Comments: Baseline for enoxaparin therapy IF NOT ALREADY DRAWN.    Signed and Held   Signed and Held  Creatinine, serum  (enoxaparin (LOVENOX)    CrCl >/= 30 ml/min)  Weekly,   R     Comments: while on enoxaparin therapy    Signed and Held   Signed and Held  Basic metabolic panel  Tomorrow morning,   R        Signed and Held   Signed and Held  CBC  Tomorrow morning,   R        Signed and Held            Vitals/Pain Today's Vitals   07/08/22 1830  BP: (!) 138/55  Pulse: 76  Resp: 18  Temp: 98.4 F (36.9 C)  SpO2: 100%    Isolation Precautions Airborne and Contact precautions  Medications Medications  vancomycin (VANCOREADY) IVPB 2000 mg/400 mL (has no administration in time range)  ceFEPIme (MAXIPIME) 2 g in sodium chloride 0.9 % 100 mL IVPB (2 g Intravenous New Bag/Given 07/08/22 1943)  0.9 %  sodium chloride infusion ( Intravenous New Bag/Given 07/08/22 1941)    Mobility walks with device     Focused Assessments    R Recommendations: See Admitting Provider Note  Report given to:   Additional Notes:

## 2022-07-08 NOTE — Progress Notes (Signed)
A consult was received from an ED physician for Vancomycin and Cefepime per pharmacy dosing (for an indication other than meningitis). The patient's profile has been reviewed for ht/wt/allergies/indication/available labs. A one time order has been placed for the above antibiotics.  Further antibiotics/pharmacy consults should be ordered by admitting physician if indicated.                       Reuel Boom, PharmD, BCPS [phone] 07/08/2022, 7:11 PM

## 2022-07-08 NOTE — Assessment & Plan Note (Signed)
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 F/u Ortho. TDWB. Power wc. 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 Fully treated, then off antibiotics.

## 2022-07-08 NOTE — Assessment & Plan Note (Signed)
CPAP.  

## 2022-07-08 NOTE — Progress Notes (Addendum)
Location:  [facility] [place] [facility] Room Number: [place] of Service:  SNF ([phone]) Provider:  Elenora Fender, NP  Heffner, Teodoro Spray, MD  Patient Care Team: Lurlean Nanny Teodoro Spray, MD as PCP - General (Family Medicine) Dorothy Spark, MD as PCP - Cardiology (Cardiology) Sueanne Margarita, MD as PCP - Sleep Medicine (Cardiology) Donzetta Sprung., MD (Sports Medicine) Linward Natal, MD (Ophthalmology) [name], Katharine Look, MD (Dermatology) Ronald Lobo, MD (Gastroenterology) Thereasa Distance, MD (Nephrology) Jola Baptist, Sharon as Referring Physician (Chiropractic Medicine)  Extended Emergency Contact Information Primary Emergency Contact: [facility] Address: [address]          [place], [place] [address] [name] of [place] Phone: [phone] Mobile Phone: [phone] Relation: Spouse  Code Status:  full code Goals of care: Advanced Directive information    07/08/2022   12:08 PM  Advanced Directives  Does Patient Have a Medical Advance Directive? Yes  Type of Paramedic of [place];Living will  Does patient want to make changes to medical advance directive? No - Patient declined  Copy of [place] in Chart? Yes - validated most recent copy scanned in chart (See row information)  Would patient like information on creating a medical advance directive? No - Patient declined     Chief Complaint  Patient presents with   Acute Visit    Patient is being seen for medication review after hospital visit     HPI:  Pt is a 82 y.o. female seen today for an acute visit for medication review following hospital stay.     Hospitalized 07/02/22-07/07/22 for the right hip cellulitis, transitioned to oral Cipro and Doxycycline until 08/01/22 from IV antibiotics in the hospital, f/u ID 2 weeks, R hip region warmth, redness, swelling present. CT hip in hospital fluid collection unclear if it communicates  with the joint space, Ortho not felt to be related to the joint space and may be an old fluid collection, aspiration not recommended.   Hypoxia, weaned off upon discharge, but on O2 [place], still c/o SOB, noted wheezing, Hospital CTA of the chest r/o PE, no acute cardiopulmomary processes  Weight gained, taper off Mirtazapine.  Elevated AST/ALT normalized, S/p cholecystectomy, [place] 02/19/21 no cyst or mass.               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 F/u Ortho. TDWB. Power wc. 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 Fully treated, then off antibiotics.              Anemia, post op, s/p EGD no active bleed. S/p 6 u PRBC transfusion, ASA was dc'd, On Fe, Iron 15 in hospital, Hgb 10.8 07/06/22             HTN only on Furosemide.              Morbid obesity             OSA CPAP             Chronic diarrhea, prn Imodium, Align             RA f/u Rheumatology, hx of Plaquenil use, stable presently.              Insomnia/depression/anxiety, stable, off Mirtazapine.              GERD, stable, on Omeprazole, prn Zofran. Hgb 10.8 07/06/22  CHF/Edema, not apparent, takes Furosemide, BNP 142  CKD stage 3 Bun/creat 20/1.01 07/06/22             Hypothyroidism, takes Levothyroxine, TSH 3.48 06/28/22     Past Medical History:  Diagnosis Date   Benign hypertension with chronic kidney disease, stage III ([place])    Overview:  Last Assessment & Plan:  Usually the patient is hypertensive. However today her blood pressure is [id number] systolic. She has been feeling fatigued with some lightheadedness. Part of this may be related to her lower blood pressure. Her pressure may be improved with her decrease in salt and fluid intake. I've instructed her to put her lisinopril/hctz on hold. I've asked her to see her primary    Cardiac disease 03/10/2014   Chronic diarrhea 07/27/2015   Edema 07/27/2015   Ejection fraction 2004   Normal, echo,     Gastroesophageal reflux disease    GERD (gastroesophageal reflux disease)    Heart disease 03/10/2014   Hypertension    Left bundle branch block 09/01/2020   Obesity    Obesity (BMI 30-39.9) 09/25/2017   OSA (obstructive sleep apnea)    mild with AHI 6.75 - on CPAP   Preop cardiovascular exam 11/2010   Cardiac clearance for knee surgery    Sleep apnea 03/10/2014   Supraventricular tachycardia    Documented episode in the past, possibly reentrant tachycardia  Overview:  Overview:  Documented episode in the past, 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 It was possibly reentrant. She does well with diltiazem. No change in therapy.   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   Thyroid disease    Urinary incontinence    Past Surgical History:  Procedure Laterality Date   CATARACT EXTRACTION Left 8/11   CATARACT EXTRACTION Right 3/12   CHOLECYSTECTOMY  1994   Copsilotomy Laser Treatment Left 6/12   eye   ELBOW SURGERY  8/12   EYE SURGERY Left 09/2008   macular hole   EYE SURGERY Right 12/11   Lumbar Infusion  2011   MOLE REMOVAL  06/17/2015   TOTAL KNEE ARTHROPLASTY Left 6/11   TOTAL KNEE ARTHROPLASTY Right 06/13/2011    Allergies  Allergen Reactions   Keflex [Cephalexin] Diarrhea   Macrobid [Nitrofurantoin] Hives   Amoxicillin Rash    Outpatient Encounter Medications as of 07/08/2022  Medication Sig   acetaminophen (TYLENOL) 325 MG tablet Take 650 mg by mouth every 6 (six) hours as needed.   albuterol (VENTOLIN HFA) 108 (90 Base) MCG/ACT inhaler Inhale 2 puffs into the lungs every 6 (six) hours as needed for wheezing or shortness of breath.   bifidobacterium infantis (ALIGN) capsule Take 1 capsule by mouth daily.   Calcium Carb-Cholecalciferol (CALCIUM PLUS VITAMIN D3 PO) Take 1 tablet by mouth daily. Vitamin D3 19mcg  + Calcium 600   ciprofloxacin (CIPRO) 750 MG tablet Take 1 tablet (750 mg total) by mouth 2 (two) times daily for 25 days.   doxycycline (VIBRAMYCIN) 100 MG capsule Take 1 capsule (100 mg total) by mouth 2 (two) times daily for 25 days.   ferrous sulfate 325 (65 FE) MG EC tablet Take 325 mg by mouth. Once A Day on Mon, Thu   furosemide (LASIX) 40 MG tablet Take 40 mg by mouth 2 (two) times daily.   gabapentin (NEURONTIN) 300 MG capsule Take 300 mg by mouth  at bedtime.   levothyroxine (SYNTHROID) 25 MCG tablet Take 50 mcg by mouth. one time a day every 2 day(s)   levothyroxine (SYNTHROID) 25 MCG tablet Take 25 mcg by mouth. one time a day every 2 day(s)   loperamide (IMODIUM) 2 MG capsule Take 2 mg by mouth every 4 (four) hours as needed.   mineral oil-hydrophilic petrolatum (AQUAPHOR) ointment Apply 1 Application topically as needed for dry skin. Apply to (L) arm &Chest topically two times a day for Itching;Rash related to ALLERGIC CONTACT DERMATITIS DU   mirtazapine (REMERON) 7.5 MG tablet Take 7.5 mg by mouth at bedtime.   nystatin (MYCOSTATIN/NYSTOP) powder Apply 1 application  topically in the morning, at noon, in the evening, and at bedtime. To genital areas and inner thigh   omeprazole (PRILOSEC) 20 MG capsule Take 20 mg by mouth daily.   ondansetron (ZOFRAN-ODT) 4 MG disintegrating tablet Take 4 mg by mouth every 8 (eight) hours as needed.   potassium chloride SA (KLOR-CON) 20 MEQ tablet Take 20 mEq by mouth daily.   Vitamin D, Ergocalciferol, 50 MCG (2000 UT) CAPS Take 2 capsules by mouth daily.   Semaglutide (OZEMPIC, 0.25 OR 0.5 MG/DOSE, [place]) Inject 0.25 mg into the skin once a week.   Semaglutide (OZEMPIC, 1 MG/DOSE, [place]) Inject 1 mg into the skin once a week.   Semaglutide (OZEMPIC, 2 MG/DOSE, [place]) Inject 2 mg into the skin once a week.   No facility-administered encounter medications on file as of 07/08/2022.    Review of Systems  Constitutional:  Positive for appetite change,  fatigue and unexpected weight change. Negative for chills and fever.  HENT:  Negative for congestion, sore throat and trouble swallowing.   Eyes:  Negative for visual disturbance.  Respiratory:  Positive for shortness of breath and wheezing. Negative for cough.   Cardiovascular:  Negative for leg swelling.  Gastrointestinal:  Negative for abdominal pain and constipation.  Endocrine: Positive for cold intolerance.  Genitourinary:  Negative for dysuria, frequency and urgency.  Musculoskeletal:  Positive for arthralgias and gait problem.  Skin:  Negative for color change.       Right hip region redness, warmth, swelling.   Neurological:  Negative for speech difficulty and weakness.  Psychiatric/Behavioral:  Negative for confusion and sleep disturbance. The patient is not nervous/anxious.     Immunization History  Administered Date(s) Administered   Influenza Split 01/28/2008, 02/17/2009, 02/20/2012, 02/11/2013, 02/02/2016, 01/11/2017, 02/04/2019   Influenza, High Dose Seasonal PF 02/04/2015, 01/11/2017, 01/30/2018, 02/05/2020   Influenza-Unspecified 02/16/2022   Moderna SARS-COV2 Booster Vaccination 02/24/2022   Moderna Sars-Covid-2 Vaccination 05/09/2019, 05/27/2019, 03/03/2020, 09/04/2020, 11/04/2020   Pfizer Covid-19 Vaccine Bivalent Booster 30yrs & up 02/09/2021   Pneumococcal Conjugate-13 08/30/2013   Pneumococcal Polysaccharide-23 11/03/2005   RSV,unspecified 04/22/2022   Td 08/01/2001, 09/05/2019   Tdap 09/22/2010, 11/24/2020   Zoster Recombinat (Shingrix) 11/08/2016, 01/11/2017   Zoster, Live 11/04/2005, 11/08/2016, 01/11/2017   Zoster, Unspecified 01/11/2017   Pertinent  Health Maintenance Due  Topic Date Due   INFLUENZA VACCINE  Completed   DEXA SCAN  Completed      10/24/2020   11:31 AM 04/15/2022   10:15 AM 04/27/2022   11:35 AM 05/31/2022    9:34 AM  [place] in the past year?  0 0 0  Was there an injury with Fall?  0 0 0  Fall Risk Category Calculator  0 0 0   Fall Risk Category (Retired)  Low Low   (RETIRED) Patient Fall  Risk Level Moderate fall risk Moderate fall risk Moderate fall risk   Patient at Risk for Falls Due to  History of fall(s) History of fall(s) History of fall(s)  Fall risk Follow up  Falls evaluation completed Falls evaluation completed Falls evaluation completed   Functional Status Survey:    Vitals:   07/08/22 1158  BP: 128/62  Pulse: 82  Resp: 20  Temp: 98 F (36.7 C)  TempSrc: Temporal  SpO2: 93%  Height: 5\' 8"  (1.727 m)   Body mass index is 48.77 kg/m. Physical Exam Vitals and nursing note reviewed.  Constitutional:      Appearance: She is obese.     Comments: Feeling tired  HENT:     Head: Normocephalic and atraumatic.     Nose: Nose normal.     Mouth/Throat:     Mouth: Mucous membranes are moist.  Eyes:     Extraocular Movements: Extraocular movements intact.     Conjunctiva/sclera: Conjunctivae normal.     Pupils: Pupils are equal, round, and reactive to light.  Cardiovascular:     Rate and Rhythm: Normal rate and regular rhythm.     Heart sounds: No murmur heard. Pulmonary:     Breath sounds: Wheezing present. No rhonchi or rales.     Comments: Needs O2 [place] to maintain Sat O2>90% Abdominal:     General: Bowel sounds are normal.     Palpations: Abdomen is soft.     Tenderness: There is no abdominal tenderness.  Genitourinary:    Comments: No urogenital irritation or skin breakdown Musculoskeletal:     Cervical back: Normal range of motion and neck supple.     Right lower leg: No edema.     Left lower leg: No edema.     Comments: R hip, prosthetic hip removed.   Skin:    General: Skin is warm and dry.     Findings: Erythema present.     Comments: Right hip surgical scar, right hip region redness, warmth, swelling.   Neurological:     General: No focal deficit present.     Mental Status: She is alert and oriented to person, place, and time.     Gait: Gait normal.  Psychiatric:         Mood and Affect: Mood normal.        Behavior: Behavior normal.        Thought Content: Thought content normal.        Judgment: Judgment normal.     Labs reviewed: Recent Labs    07/04/22 0502 07/05/22 0538 07/06/22 0702  NA 134* 135 136  K 4.2 3.7 3.4*  CL 104 106 109  CO2 20* 19* 19*  GLUCOSE 96 94 106*  BUN 25* 24* 20  CREATININE 1.21* 1.14* 1.01*  CALCIUM 8.2* 8.4* 8.4*  MG 2.4 2.4 2.4  PHOS 3.6 4.3 3.6   Recent Labs    07/04/22 0502 07/05/22 0538 07/06/22 0702  AST 23 30 36  ALT 17 21 25   ALKPHOS 71 66 63  BILITOT 0.6 0.6 0.3  PROT 6.2* 6.3* 6.4*  ALBUMIN 2.2* 2.2* 2.3*   Recent Labs    07/04/22 0502 07/05/22 0538 07/06/22 0702  WBC 12.6* 11.7* 11.9*  NEUTROABS 10.0* 9.5* 9.5*  HGB 10.9* 11.4* 10.8*  HCT 36.4 38.5 37.0  MCV 91.0 92.3 91.8  PLT 475* 507* 536*   Lab Results  Component Value Date   TSH 4.51 06/30/2021   No results found for: "HGBA1C" No  results found for: "CHOL", "HDL", "LDLCALC", "LDLDIRECT", "TRIG", "CHOLHDL"  Significant Diagnostic Results in last 30 days:  DG CHEST PORT 1 VIEW  Result Date: 07/06/2022 CLINICAL DATA:  Shortness of breath. EXAM: PORTABLE CHEST 1 VIEW COMPARISON:  None of 07/04/2022 FINDINGS: Stable cardiomediastinal contours. Lung volumes are low. Mild scratch set increase interstitial markings are identified bilaterally concerning for pulmonary edema. Cannot exclude small pleural effusions. Decreased aeration to the left base may represent atelectasis or airspace disease. IMPRESSION: 1. Suspect mild congestive heart failure. 2. Decreased aeration to the left base may represent atelectasis or airspace disease. Electronically Signed   By: Kerby Moors M.D.   On: 07/06/2022 08:05   DG CHEST PORT 1 VIEW  Result Date: 07/04/2022 CLINICAL DATA:  Shortness of breath. EXAM: PORTABLE CHEST 1 VIEW COMPARISON:  07/03/2018 for FINDINGS: Stable cardiomediastinal contours. Low lung volumes. Unchanged left base atelectasis. No  signs of pleural effusion, interstitial edema or consolidation. IMPRESSION: No change in aeration to the lungs compared with previous exam. Electronically Signed   By: Kerby Moors M.D.   On: 07/04/2022 08:18   DG CHEST PORT 1 VIEW  Result Date: 07/03/2022 CLINICAL DATA:  Shortness of breath. EXAM: PORTABLE CHEST 1 VIEW COMPARISON:  07/02/2022 and prior studies FINDINGS: This is a low volume study. The cardiomediastinal silhouette is unchanged. Mild LEFT basilar opacities are noted. There is no evidence of pneumothorax or pleural effusion. No acute bony abnormalities are noted. IMPRESSION: Mild LEFT basilar opacities, favor atelectasis but infection/pneumonia is not excluded. Electronically Signed   By: Margarette Canada M.D.   On: 07/03/2022 16:30   CT Hip Right Wo Contrast  Result Date: 07/02/2022 CLINICAL DATA:  Septic arthritis suspected. Hip x-ray done. Hip pain and redness in the right hip. EXAM: CT OF THE RIGHT HIP WITHOUT CONTRAST TECHNIQUE: Multidetector CT imaging of the right hip was performed according to the standard protocol. Multiplanar CT image reconstructions were also generated. RADIATION DOSE REDUCTION: This exam was performed according to the departmental dose-optimization program which includes automated exposure control, adjustment of the mA and/or kV according to patient size and/or use of iterative reconstruction technique. 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 Increased callus formation about the acetabulum with redemonstrated fragmentation along the medial aspect of the acetabulum. Unchanged fragmentation of the right greater trochanter. MR nondisplaced longitudinal fracture and osseous fragmentation about the proximal femoral diaphysis. The proximal end of the femoral diaphysis has shifted anteriorly compared to 06/08/2021. Unchanged mixed  soft tissue and fluid components at the site of the prior femoral head and neck component compatible with granulation tissue. There may be a central fluid component/seroma though this is similar to decreased from 06/08/2021. Ligaments Suboptimally assessed by CT. Muscles and Tendons Muscle atrophy of the gluteal muscles. Soft tissues Similar postsurgical scarring in the subcutaneous fat anterior to the right hip. Increased size of the thick-walled fluid collection in the lateral right subcutaneous fat extending medially to the greater trochanter fragmentation. This is not completely visualized on this exam and extends laterally beyond the lateral aspect of the scan. Suggestion of adjacent stranding lateral to the fluid collection though this is largely excluded from the field-of-view. IMPRESSION: 1. 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 There is some interval healing change since 06/08/2021. 2. Unchanged granulation tissue and possible central seroma about the proximal end of the femoral diaphysis and acetabulum. 3. Increased size  of the thick-walled fluid collection in the lateral right subcutaneous fat extending medially to the greater trochanter fragmentation. This is not completely visualized on this exam and extends laterally beyond the field-of-view. There is the suggestion of subcutaneous stranding lateral to the fluid collection. Clinical correlation is recommended to exclude cellulitis. If there is concern for abscess, consider further evaluation with ultrasound and/or fluid sampling. It is unclear if the large subcutaneous fluid collection communicates with the joint space. Electronically Signed   By: Placido Sou M.D.   On: 07/02/2022 23:43   DG Hip Unilat With Pelvis 2-3 Views Right  Result Date: 07/02/2022 CLINICAL DATA:  Hip pain with redness and swelling to the right hip EXAM: DG HIP (WITH OR WITHOUT PELVIS) 2-3V RIGHT COMPARISON:  CT of the right hip  06/08/2021 FINDINGS: Findings appear similar to CT 06/08/2021 given differences in technique. Changes from prior right hip arthroplasty with subsequent hardware removal. Fragmentation of the right greater trochanter. Cortical thickening and sclerosis about the proximal femoral shaft and acetabulum. No definite acute fracture. The soft tissue thickening and fluid about the right hip was better demonstrated on CT. Contrast within the bladder IMPRESSION: No definite acute fracture. Similar changes about the right hip from right hip arthroplasty and subsequent heart were removal when compared with CT 06/08/2021. Electronically Signed   By: Placido Sou M.D.   On: 07/02/2022 22:01   CT Angio Chest PE W/Cm &/Or Wo Cm  Result Date: 07/02/2022 CLINICAL DATA:  Pulmonary embolism suspected.  High probability. EXAM: CT ANGIOGRAPHY CHEST WITH CONTRAST TECHNIQUE: Multidetector CT imaging of the chest was performed using the standard protocol during bolus administration of intravenous contrast. Multiplanar CT image reconstructions and MIPs were obtained to evaluate the vascular anatomy. RADIATION DOSE REDUCTION: This exam was performed according to the departmental dose-optimization program which includes automated exposure control, adjustment of the mA and/or kV according to patient size and/or use of iterative reconstruction technique. CONTRAST:  42mL OMNIPAQUE IOHEXOL 350 MG/ML SOLN COMPARISON:  Portable chest today, portable chest 10/24/2020. FINDINGS: Cardiovascular: There is mild cardiomegaly and small pericardial effusion. There are scattered three-vessel coronary artery calcifications. The pulmonary veins are normal caliber. The pulmonary arteries are normal caliber and clear through the segmental divisions. Due to breathing motion, the subsegmental arterial bed is obscured and not evaluated. There is mild aortic atherosclerosis and tortuosity without aneurysm, stenosis or dissection. The great vessels are clear.  Mediastinum/Nodes: There is a moderate-sized hiatal hernia with gastroesophageal reflux to the level of the aortic arch. The esophageal thickness is normal. The trachea and main bronchi are patent. There are minimal retained secretions in the right main bronchus. There is no intrathoracic or axillary adenopathy. Axillary spaces are clear. Normal thyroid. Lungs/Pleura: There small symmetric bilateral layering pleural effusions and adjacent coarse atelectatic markings in the bilateral lower lobes. There is mild posterior atelectasis in the upper lung fields. Reticulated scarring noted both apices. No focal infiltrate or nodule is seen through the breathing motion. There is no pulmonary edema. Upper Abdomen: Status post cholecystectomy. No biliary dilatation. Moderate hepatic steatosis. No acute upper abdominal findings. Musculoskeletal: There is thoracic kyphosis, osteopenia, spondylosis and multilevel degenerative discs, multilevel bridging enthesopathy. Bilateral acromiohumeral abutment consistent with chronic rotator cuff arthropathy and degenerative tears is also noted with bilateral supraspinatus fatty atrophy. No acute or other significant musculoskeletal findings. Review of the MIP images confirms the above findings. IMPRESSION: 1. No evidence of arterial dilatation through the segmental divisions. The subsegmental arterial bed is obscured by  breathing motion. 2. Cardiomegaly with small pericardial effusion and three-vessel coronary atherosclerosis. 3. Small bilateral pleural effusions with adjacent atelectasis in the lower lobes. 4. Moderate-sized hiatal hernia with gastroesophageal reflux to the level of the aortic arch. Aspiration precautions may be indicated. 5. Aortic atherosclerosis. 6. Moderate hepatic steatosis. 7. Osteopenia, kyphosis and degenerative change. 8. 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 Aortic Atherosclerosis (ICD10-I70.0).  Electronically Signed   By: Telford Nab M.D.   On: 07/02/2022 21:32   DG Chest Port 1 View  Result Date: 07/02/2022 CLINICAL DATA:  Sternal chest pain, worse with movement EXAM: PORTABLE CHEST 1 VIEW COMPARISON:  Chest radiograph dated 10/24/2020 FINDINGS: Low lung volumes with bronchovascular crowding. Bibasilar and bilateral perihilar patchy opacities. 0w0 No pleural effusion or pneumothorax. The heart size and mediastinal contours are within normal limits. The visualized skeletal structures are unremarkable. IMPRESSION: 1. Low lung volumes with bronchovascular crowding. 2. Bibasilar and bilateral perihilar patchy opacities, which could be due to atelectasis or infection. Electronically Signed   By: Darrin Nipper M.D.   On: 07/02/2022 19:08    Assessment/Plan Multifocal pneumonia 07/08/22 wheezing, SOB noted, O2 [place] to maintain SatO2>90%, DuoNeb q8hr, CXR ap/lateral, CBC/diff, CMP/eGFR stat. Dc Mirtazapine HPOA/patient desire 07/08/22 multifocal pneumonia/may reflect CHF,  already on multiple antibiotics, will try extra dose of Furosemide, observe, ED eval if worsens.   Cellulitis of right hip Hospitalized 07/02/22-07/07/22 for the right hip cellulitis, transitioned to oral Cipro and Doxycycline until 08/01/22 from IV antibiotics in the hospital, f/u ID 2 weeks, R hip region warmth, redness, swelling present. CT hip in hospital fluid collection unclear if it communicates with the joint space, Ortho not felt to be related to the joint space and may be an old fluid collection, aspiration not recommended.   Weight gain Taper off Mirtazapine.   Absence of hip joint, right Absent hip, s/p right THR 2018, removal of hardware 12/29/20,  takes Tylenol, Gabapentin. F/u Ortho. TDWB. Power wc. 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 Fully treated, then off antibiotics.   Blood loss anemia  post op, s/p EGD no active bleed. S/p 6 u PRBC transfusion, ASA was dc'd, On Fe, Iron 15 in hospital, Hgb  10.8 07/06/22  Benign essential HTN Blood pressure is controlled, on Furosemide.   Obstructive sleep apnea CPAP  GERD (gastroesophageal reflux disease) stable, on Omeprazole, prn Zofran. Hgb 10.8 0000000  Chronic diastolic CHF (congestive heart failure) (HCC) not apparent, takes Furosemide, BNP 142   Stage 3a chronic kidney disease (CKD) ([place]) Bun/creat 20/1.01 07/06/22  Acquired hypothyroidism  takes Levothyroxine, TSH 3.48 06/28/22     Family/ staff Communication: plan of care reviewed with the patient and charge nurse.   Labs/tests ordered:  CBC/diff, CMP/eGFR, CXR ap/lateral.  Time spend 35 minutes.

## 2022-07-08 NOTE — Assessment & Plan Note (Signed)
Renal function is stable. Plan to start aggressive diuresis with IV furosemide and oral spironolactone.  Follow up renal function in am.  Keep K at 4 and Mg at 2.

## 2022-07-08 NOTE — Assessment & Plan Note (Signed)
Right hip cellulitis.  No clinical sings of worsening cellulitis. Erythema likely due to local edema. Continue with oral antibiotic therapy with ciprofloxacin and doxycycline.

## 2022-07-08 NOTE — Assessment & Plan Note (Signed)
Continue with antiacid therapy.  ?

## 2022-07-08 NOTE — Assessment & Plan Note (Signed)
Hospitalized 07/02/22-07/07/22 for the right hip cellulitis, transitioned to oral Cipro and Doxycycline until 08/01/22 from IV antibiotics in the hospital, f/u ID 2 weeks, R hip region warmth, redness, swelling present. CT hip in hospital fluid collection unclear if it communicates with the joint space, Ortho not felt to be related to the joint space and may be an old fluid collection, aspiration not recommended.

## 2022-07-08 NOTE — Assessment & Plan Note (Signed)
Patient with iron deficiency.  Continue oral iron supplementation for now.  Check iron panel, may need IV iron supplementation.

## 2022-07-08 NOTE — Assessment & Plan Note (Signed)
Taper off Mirtazapine.

## 2022-07-08 NOTE — Assessment & Plan Note (Signed)
Blood pressure is controlled, on Furosemide.  

## 2022-07-08 NOTE — Assessment & Plan Note (Addendum)
07/08/22 wheezing, SOB noted, O2 [place] to maintain SatO2>90%, DuoNeb q8hr, CXR ap/lateral, CBC/diff, CMP/eGFR stat. Dc Mirtazapine HPOA/patient desire 07/08/22 multifocal pneumonia/may reflect CHF,  already on multiple antibiotics, will try extra dose of Furosemide, observe, ED eval if worsens.

## 2022-07-08 NOTE — Assessment & Plan Note (Signed)
not apparent, takes Furosemide, BNP 142

## 2022-07-08 NOTE — Assessment & Plan Note (Signed)
Blood pressure has been 120 to 138 mmHg.  Plan to continue aggressive diuresis with IV furosemide and oral spironolactone. Continue blood pressure monitoring, if stable will plan to add ARB.

## 2022-07-08 NOTE — ED Triage Notes (Signed)
Patient coming from [place] via EMS with c/o pneumonia confirmed by CXR at facility. SOB since this morning. Per EMS left lung sounds rhonchi. Patient has no complaints at this time.   116/70 74 HR 98% 3 L [place] (2.5-3L baseline) 22 RR

## 2022-07-08 NOTE — Assessment & Plan Note (Signed)
Calculated BMI is 45.6

## 2022-07-08 NOTE — Assessment & Plan Note (Signed)
post op, s/p EGD no active bleed. S/p 6 u PRBC transfusion, ASA was dc'd, On Fe, Iron 15 in hospital, Hgb 10.8 07/06/22

## 2022-07-08 NOTE — ED Provider Notes (Signed)
[facility] Provider Note   CSN: [id number] Arrival date & time: 07/08/22  1640     History  Chief Complaint  Patient presents with   Pneumonia    Madison [name] is a 82 y.o. female presenting from her nursing facility with concern for multifocal pneumonia, [facility], complaining of shortness of breath for approximately 1 day.  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  She tells me that she does NOT normally wear oxygen (but documented at 2-3L "baseline").  Medical record show the patient was discharged from the hospital yesterday.  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  Is of concern for infection around her operative site, she was admitted for right hip cellulitis, treated with IV antibiotics in the hospital and transitioned to doxycycline and ciprofloxacin at discharge, per her paperwork that she presents back with.  She was noted to have acute respiratory failure with hypoxia which resolved in the hospital, and reported to not have chronic home oxygen use and was weaned off of oxygen at discharge.  HPI     Home Medications Prior to Admission medications   Medication Sig Start Date End Date Taking? Authorizing Provider  acetaminophen (TYLENOL) 325 MG tablet Take 650 mg by mouth every 6 (six) hours as needed.    [provider]  albuterol (VENTOLIN HFA) 108 (90 Base) MCG/ACT inhaler Inhale 2 puffs into the lungs every 6 (six) hours as needed for wheezing or shortness of breath.    [provider]  bifidobacterium infantis (ALIGN) capsule Take 1 capsule by mouth daily.    [provider]  Calcium Carb-Cholecalciferol (CALCIUM PLUS VITAMIN D3 PO) Take 1 tablet by mouth daily. Vitamin D3 19mcg + Calcium 600    [provider]  ciprofloxacin  (CIPRO) 750 MG tablet Take 1 tablet (750 mg total) by mouth 2 (two) times daily for 25 days. 07/07/22 08/01/22  Dwyane Dee, MD  doxycycline (VIBRAMYCIN) 100 MG capsule Take 1 capsule (100 mg total) by mouth 2 (two) times daily for 25 days. 07/07/22 08/01/22  Dwyane Dee, MD  ferrous sulfate 325 (65 FE) MG EC tablet Take 325 mg by mouth. Once A Day on Mon, Thu    [provider]  furosemide (LASIX) 40 MG tablet Take 40 mg by mouth 2 (two) times daily.    [provider]  gabapentin (NEURONTIN) 300 MG capsule Take 300 mg by mouth at bedtime. 03/02/20   [provider]  levothyroxine (SYNTHROID) 25 MCG tablet Take 50 mcg by mouth. one time a day every 2 day(s) 02/06/21   [provider]  levothyroxine (SYNTHROID) 25 MCG tablet Take 25 mcg by mouth. one time a day every 2 day(s)    [provider]  loperamide (IMODIUM) 2 MG capsule Take 2 mg by mouth every 4 (four) hours as needed. 02/04/21   [provider]  mineral oil-hydrophilic petrolatum (AQUAPHOR) ointment Apply 1 Application topically as needed for dry skin. Apply to (L) arm &Chest topically two times a day for Itching;Rash related to ALLERGIC CONTACT DERMATITIS DU    [provider]  mirtazapine (REMERON) 7.5 MG tablet Take 7.5 mg by mouth at bedtime.    [provider]  nystatin (MYCOSTATIN/NYSTOP) powder Apply 1 application  topically in the morning, at noon, in the evening, and at bedtime. To  genital areas and inner thigh    [provider]  omeprazole (PRILOSEC) 20 MG capsule Take 20 mg by mouth daily.    [provider]  ondansetron (ZOFRAN-ODT) 4 MG disintegrating tablet Take 4 mg by mouth every 8 (eight) hours as needed. 02/04/21   [provider]  potassium chloride SA (KLOR-CON) 20 MEQ tablet Take 20 mEq by mouth daily.    [provider]  Semaglutide (OZEMPIC, 0.25 OR 0.5 MG/DOSE, [place]) Inject 0.25 mg into the skin once a week.     [provider]  Semaglutide (OZEMPIC, 1 MG/DOSE, ) Inject 1 mg into the skin once a week.    [provider]  Semaglutide (OZEMPIC, 2 MG/DOSE, ) Inject 2 mg into the skin once a week.    [provider]  Vitamin D, Ergocalciferol, 50 MCG (2000 UT) CAPS Take 2 capsules by mouth daily.    [provider]      Allergies    Keflex [cephalexin], Macrobid [nitrofurantoin], and Amoxicillin    Review of Systems   Review of Systems  Physical Exam Updated Vital Signs BP (!) 138/55   Pulse 76   Temp 98.4 F (36.9 C)   Resp 18   SpO2 100%  Physical Exam Constitutional:      General: She is not in acute distress. HENT:     Head: Normocephalic and atraumatic.  Eyes:     Conjunctiva/sclera: Conjunctivae normal.     Pupils: Pupils are equal, round, and reactive to light.  Cardiovascular:     Rate and Rhythm: Normal rate and regular rhythm.  Pulmonary:     Effort: Pulmonary effort is normal. No respiratory distress.     Breath sounds: Normal breath sounds.     Comments: 3L [place] Abdominal:     General: There is no distension.     Tenderness: There is no abdominal tenderness.  Musculoskeletal:     Right lower leg: Edema present.     Left lower leg: Edema present.  Skin:    General: Skin is warm and dry.  Neurological:     General: No focal deficit present.     Mental Status: She is alert. Mental status is at baseline.  Psychiatric:        Mood and Affect: Mood normal.        Behavior: Behavior normal.     ED Results / Procedures / Treatments   Labs (all labs ordered are listed, but only abnormal results are displayed) Labs Reviewed  BASIC METABOLIC PANEL - Abnormal; Notable for the following components:      Result Value   Sodium 133 (*)    CO2 17 (*)    Glucose, Bld 112 (*)    Creatinine, Ser 1.18 (*)    Calcium 8.2 (*)    GFR, Estimated 46 (*)    All other components within normal limits  CBC WITH DIFFERENTIAL/PLATELET - Abnormal;  Notable for the following components:   WBC 15.6 (*)    Hemoglobin 11.7 (*)    Platelets 607 (*)    Neutro Abs 12.1 (*)    Monocytes Absolute 1.3 (*)    Abs Immature Granulocytes 0.62 (*)    All other components within normal limits  BRAIN NATRIURETIC PEPTIDE - Abnormal; Notable for the following components:   B Natriuretic Peptide 153.2 (*)    All other components within normal limits  RESP PANEL BY RT-PCR (RSV, FLU A&B, COVID)  [id number]    EKG None  Radiology DG Chest Portable 1 View  Result Date: 07/08/2022 CLINICAL DATA:  Short of breath, pneumonia EXAM: PORTABLE CHEST 1 VIEW COMPARISON:  07/06/2022 FINDINGS: Single frontal view of the chest demonstrates stable enlargement of the cardiac silhouette. Multifocal bilateral airspace disease greatest in the right upper and left lower lung zones. Small left effusion is suspected. Continued central vascular congestion. No pneumothorax. No acute bony abnormality. IMPRESSION: 1. Constellation of findings most suggestive of congestive heart failure, with no significant change in volume status since prior study. Underlying pneumonia would be difficult to exclude. Electronically Signed   By: Randa Ngo M.D.   On: 07/08/2022 17:36    Procedures Procedures    Medications Ordered in ED Medications  0.9 %  sodium chloride infusion (has no administration in time range)  vancomycin (VANCOREADY) IVPB 2000 mg/400 mL (has no administration in time range)  ceFEPIme (MAXIPIME) 2 g in sodium chloride 0.9 % 100 mL IVPB (has no administration in time range)    ED Course/ Medical Decision Making/ A&P Clinical Course as of 07/08/22 1926  Fri Jul 08, 2022  1926 Admitted to hospitalist [MT]    Clinical Course User Index [MT] Langston Masker Carola Rhine, MD                             Medical Decision Making Amount and/or Complexity of Data Reviewed Labs: ordered. Radiology: ordered.  Risk Prescription drug management. Decision regarding  hospitalization.   This patient presents to the ED with concern for shortness of breath. This involves an extensive number of treatment options, and is a complaint that carries with it a high risk of complications and morbidity.  The differential diagnosis includes bacterial pneumonia versus viral illness versus congestive heart failure exacerbation versus pneumothorax versus pleural effusion versus anemia versus other  Co-morbidities that complicate the patient evaluation: History of heart failure at high risk of CHF exacerbation  Additional history obtained from EMS  External records from outside source obtained and reviewed including hospital records/discharge summary from yesterday  I ordered and personally interpreted labs.  The pertinent results include: White blood cell count elevated at 15.6.  BMP near baseline.  BNP close to recent baseline.  Covid/flu negative  I ordered imaging studies including xray of the chest I independently visualized and interpreted imaging which showed pulmonary edema type pattern, no clear infiltrate I agree with the radiologist interpretation  The patient was maintained on a cardiac monitor.  I personally viewed and interpreted the cardiac monitored which showed an underlying rhythm of: Sinus rhythm  Per my interpretation the patient's ECG shows no acute ischemic findings  I ordered medication including IV vancomycin and cefepime for hospital-acquired pneumonia coverage.  Normal saline infusion at 75 mL an hour ordered, slow infusion given her history of heart failure potential mild pulmonary edema.  Patient does not show evidence of severe sepsis or septic shock.  I have reviewed the patients home medicines and have made adjustments as needed  Test Considered: I have a lower suspicion for acute PE in this clinical setting and not seen indication for CT angiogram   After the interventions noted above, I reevaluated the patient and found that they  have: stayed the same   Dispostion:  After consideration of the diagnostic results and the patients response to treatment, I feel that the patent would benefit from medical admission         Final Clinical Impression(s) / ED Diagnoses Final  diagnoses:  None    Rx / DC Orders ED Discharge Orders     None         Javohn Basey, Carola Rhine, MD 07/08/22 [phone]

## 2022-07-08 NOTE — Assessment & Plan Note (Signed)
takes Levothyroxine, TSH 3.48 06/28/22

## 2022-07-08 NOTE — Assessment & Plan Note (Signed)
Bun/creat 20/1.01 07/06/22

## 2022-07-08 NOTE — Assessment & Plan Note (Addendum)
Last echocardiogram is from 2021, with preserved LV systolic function and mild aortic stenosis.   Patient with severe volume overload.   Plan for diuresis with furosemide 60 mg IV q12 hrs. Add spironolactone. Likely patient not good candidate for SGLT 2 inh due to risk for urinary tract and skin infections.  Follow up on repeat echocardiogram.   Acute hypoxemic respiratory failure due to acute cardiogenic pulmonary edema. Pneumonia ruled out.  Continue supplemental 02 per [place] to keep 02 saturation 92% or greater. Continue aggressive diuresis.

## 2022-07-08 NOTE — H&P (Addendum)
History and Physical    Patient: Madison [name] [id number] DOB: May 25, 1940 DOA: 07/08/2022 DOS: the patient was seen and examined on 07/08/2022 PCP: Oswaldo Conroy, MD  Patient coming from: SNF  Chief Complaint:  Chief Complaint  Patient presents with   Pneumonia   HPI: [name] is a 82 y.o. 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   Patient had a recent hospitalization, from 07/02/22 to 07/07/22 for right hip cellulitis complicated with sepsis. Patient was treated with IV antibiotic therapy. ID and orthopedics were consulted. Plan to continue oral antibiotic therapy with ciprofloxacin and doxycycline until 08/01/2022.  Per her husband who is at the bedside, apparently last night patient was found dyspneic at the SNF, she had increased work of breathing and was placed on supplemental 02 per [place]. Today she was evaluated by nurse practitioner and was found with wheezing. Radiographic evaluation suggested pulmonary edema or pneumonia. Decision was made to transfer patient back to the ED for further evaluation.   Per her husband patient's symptoms improved with supplemental 02 per [place] but she did have significant dyspnea and increased work of breathing at the SNF.   Patient has been on furosemide 40 mg po bid, and it is reported no heart failure exacerbation during her last hospitalization.  Per her husband patient has been gaining weight with worsening edema. Approximately 60 lbs over last 18  months.  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   Most of information is obtained from her husband at the bedside. On direct questioning to the patient history is limited due to apparent cognitive impairment and decreased hearing.    Review of Systems: As mentioned in the  history of present illness. All other systems reviewed and are negative. Past Medical History:  Diagnosis Date   Benign hypertension with chronic kidney disease, stage III ([place])    Overview:  Last Assessment & Plan:  Usually the patient is hypertensive. However today her blood pressure is 123456 systolic. She has been feeling fatigued with some lightheadedness. Part of this may be related to her lower blood pressure. Her pressure may be improved with her decrease in salt and fluid intake. I've instructed her to put her lisinopril/hctz on hold. 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 It was possibly reentrant. She does well with diltiazem. No change in therapy.   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   Thyroid disease  Urinary incontinence    Past Surgical History:  Procedure Laterality Date   CATARACT EXTRACTION Left 8/11   CATARACT EXTRACTION Right 3/12   CHOLECYSTECTOMY  1994   Copsilotomy Laser Treatment Left 6/12   eye   ELBOW SURGERY  8/12   EYE SURGERY Left 09/2008   macular hole   EYE SURGERY Right 12/11   Lumbar Infusion  2011   MOLE REMOVAL   06/17/2015   TOTAL KNEE ARTHROPLASTY Left 6/11   TOTAL KNEE ARTHROPLASTY Right 06/13/2011   Social History:  reports that she has quit smoking. She has never used smokeless tobacco. She reports that she does not drink alcohol and does not use drugs.  Allergies  Allergen Reactions   Keflex [Cephalexin] Diarrhea   Macrobid [Nitrofurantoin] Hives   Amoxicillin Rash    Family History  Problem Relation Age of Onset   High blood pressure Mother    Diabetes Mother    Heart Problems Father    Diabetes Father    Prostate cancer Father    Heart attack Father    Heart attack Paternal Grandmother    Heart attack Maternal Grandfather    Heart attack Paternal Uncle    Stroke Neg Hx     Prior to Admission medications   Medication Sig Start Date End Date Taking? Authorizing Provider  acetaminophen (TYLENOL) 325 MG tablet Take 650 mg by mouth every 6 (six) hours as needed.    [provider]  albuterol (VENTOLIN HFA) 108 (90 Base) MCG/ACT inhaler Inhale 2 puffs into the lungs every 6 (six) hours as needed for wheezing or shortness of breath.    [provider]  bifidobacterium infantis (ALIGN) capsule Take 1 capsule by mouth daily.    [provider]  Calcium Carb-Cholecalciferol (CALCIUM PLUS VITAMIN D3 PO) Take 1 tablet by mouth daily. Vitamin D3 64mcg + Calcium 600    [provider]  ciprofloxacin (CIPRO) 750 MG tablet Take 1 tablet (750 mg total) by mouth 2 (two) times daily for 25 days. 07/07/22 08/01/22  Dwyane Dee, MD  doxycycline (VIBRAMYCIN) 100 MG capsule Take 1 capsule (100 mg total) by mouth 2 (two) times daily for 25 days. 07/07/22 08/01/22  Dwyane Dee, MD  ferrous sulfate 325 (65 FE) MG EC tablet Take 325 mg by mouth. Once A Day on Mon, Thu    [provider]  furosemide (LASIX) 40 MG tablet Take 40 mg by mouth 2 (two) times daily.    [provider]  gabapentin (NEURONTIN) 300 MG capsule Take 300 mg by mouth at bedtime.  03/02/20   [provider]  levothyroxine (SYNTHROID) 25 MCG tablet Take 50 mcg by mouth. one time a day every 2 day(s) 02/06/21   [provider]  levothyroxine (SYNTHROID) 25 MCG tablet Take 25 mcg by mouth. one time a day every 2 day(s)    [provider]  loperamide (IMODIUM) 2 MG capsule Take 2 mg by mouth every 4 (four) hours as needed. 02/04/21   [provider]  mineral oil-hydrophilic petrolatum (AQUAPHOR) ointment Apply 1 Application topically as needed for dry skin. Apply to (L) arm &Chest topically two times a day for Itching;Rash related to ALLERGIC CONTACT DERMATITIS DU    [provider]  mirtazapine (REMERON) 7.5 MG tablet Take 7.5 mg by mouth at bedtime.    [provider]  nystatin (MYCOSTATIN/NYSTOP) powder Apply 1 application  topically in the morning, at noon, in the evening, and at bedtime. To genital areas  and inner thigh    [provider]  omeprazole (PRILOSEC) 20 MG capsule Take 20 mg by mouth daily.    [provider]  ondansetron (ZOFRAN-ODT) 4 MG disintegrating tablet Take 4 mg by mouth every 8 (eight) hours as needed. 02/04/21   [provider]  potassium chloride SA (KLOR-CON) 20 MEQ tablet Take 20 mEq by mouth daily.    [provider]  Semaglutide (OZEMPIC, 0.25 OR 0.5 MG/DOSE, [place]) Inject 0.25 mg into the skin once a week.    [provider]  Semaglutide (OZEMPIC, 1 MG/DOSE, [place]) Inject 1 mg into the skin once a week.    [provider]  Semaglutide (OZEMPIC, 2 MG/DOSE, [place]) Inject 2 mg into the skin once a week.    [provider]  Vitamin D, Ergocalciferol, 50 MCG (2000 UT) CAPS Take 2 capsules by mouth daily.    [provider]    Physical Exam: Vitals:   07/08/22 1830  BP: (!) 138/55  Pulse: 76  Resp: 18  Temp: 98.4 F (36.9 C)  SpO2: 100%   Neurology awake and alert, deconditioned and ill looking appearing ENT with positive pallor  with no icterus Cardiovascular with S1 and S2 present and rhythmic with no gallops, rubs or murmurs Difficult to assess JVD due to wide neck,  Positive bilateral lower extremity edema up to the hips +++ pitting Respiratory with decreased breath sounds at bases with no wheezing, or rhonchi, rales at bases, poor inspiratory effort. Anterior auscultation Abdomen protuberant with no ascites or tenderness.  Skin with bilateral hip erythema, more on the right than left, no ulceration or purulence.  No visible open wounds, no increased local temperature.   Data Reviewed:   Na 133, K 3,9 Cl 110, bicarbonate 17 glucose 112 bun 17 cr 1,18 BNP 153  Wbc 15,6 hgb 11,7 plt 607  Sars covid 19 negative   Chest radiograph with cardiomegaly with bilateral hilar vascular congestion, with cephalization of the vasculature and bilateral symmetric interstitial infiltrates, small left pleural effusion.   EKG from 03/09 96 bpm, normal axis, normal intervals, sinus rhythm, with no significant ST segment or T wave changes.   82 yo female with non ambulatory state since 2022, CKD and heart failure with a recent hospitalization for right hip cellulitis, discharged 24 hrs ago who presented with dyspnea, and increased 02 requirements from the SNF. She is hemodynamically stable on her physical examination with significant edema.  Renal function is stable.  Chest radiograph with signs of acute pulmonary edema.   Assessment and Plan: * Acute on chronic diastolic CHF (congestive heart failure) (HCC) Last echocardiogram is from 2021, with preserved LV systolic function and mild aortic stenosis.   Patient with severe volume overload.   Plan for diuresis with furosemide 60 mg IV q12 hrs. Add spironolactone. Likely patient not good candidate for SGLT 2 inh due to risk for urinary tract and skin infections.  Follow up on repeat echocardiogram.   Acute hypoxemic respiratory failure due to acute cardiogenic pulmonary  edema. Pneumonia ruled out.  Continue supplemental 02 per [place] to keep 02 saturation 92% or greater. Continue aggressive diuresis.    Essential hypertension Blood pressure has been 120 to 138 mmHg.  Plan to continue aggressive diuresis with IV furosemide and oral spironolactone. Continue blood pressure monitoring, if stable will plan to add ARB.   Stage 3a chronic kidney disease (CKD) ([place]) Renal function is stable. Plan to start aggressive diuresis with IV furosemide and oral  spironolactone.  Follow up renal function in am.  Keep K at 4 and Mg at 2.   GERD (gastroesophageal reflux disease) Continue with antiacid therapy.   Acquired hypothyroidism Continue levothyroxine.   Absence of hip joint, right Right hip cellulitis.  No clinical sings of worsening cellulitis. Erythema likely due to local edema. Continue with oral antibiotic therapy with ciprofloxacin and doxycycline.   Class 3 obesity (HCC) Calculated BMI is 45.6   Chronic anemia Patient with iron deficiency.  Continue oral iron supplementation for now.  Check iron panel, may need IV iron supplementation.       Advance Care Planning:   Code Status: Full Code   Consults: none   Family Communication: I spoke with patient's husband at the bedside, we talked in detail about patient's condition, plan of care and prognosis and all questions were addressed.   Severity of Illness: The appropriate patient status for this patient is INPATIENT. Inpatient status is judged to be reasonable and necessary in order to provide the required intensity of service to ensure the patient's safety. The patient's presenting symptoms, physical exam findings, and initial radiographic and laboratory data in the context of their chronic comorbidities is felt to place them at high risk for further clinical deterioration. Furthermore, it is not anticipated that the patient will be medically stable for discharge from the hospital within 2  midnights of admission.   * I certify that at the point of admission it is my clinical judgment that the patient will require inpatient hospital care spanning beyond 2 midnights from the point of admission due to high intensity of service, high risk for further deterioration and high frequency of surveillance required.*  Author: Tawni Millers, MD 07/08/2022 7:24 PM  For on call review [email or web address].

## 2022-07-09 ENCOUNTER — Inpatient Hospital Stay (HOSPITAL_COMMUNITY): Payer: Medicare Other

## 2022-07-09 DIAGNOSIS — E876 Hypokalemia: Secondary | ICD-10-CM

## 2022-07-09 DIAGNOSIS — I5033 Acute on chronic diastolic (congestive) heart failure: Secondary | ICD-10-CM | POA: Diagnosis not present

## 2022-07-09 DIAGNOSIS — R0609 Other forms of dyspnea: Secondary | ICD-10-CM

## 2022-07-09 DIAGNOSIS — J9601 Acute respiratory failure with hypoxia: Secondary | ICD-10-CM | POA: Diagnosis not present

## 2022-07-09 DIAGNOSIS — L03115 Cellulitis of right lower limb: Secondary | ICD-10-CM

## 2022-07-09 LAB — BASIC METABOLIC PANEL
Anion gap: 10 (ref 5–15)
BUN: 20 mg/dL (ref 8–23)
CO2: 17 mmol/L — ABNORMAL LOW (ref 22–32)
Calcium: 8 mg/dL — ABNORMAL LOW (ref 8.9–10.3)
Chloride: 109 mmol/L (ref 98–111)
Creatinine, Ser: 1.11 mg/dL — ABNORMAL HIGH (ref 0.44–1.00)
GFR, Estimated: 50 mL/min — ABNORMAL LOW (ref >60)
Glucose, Bld: 104 mg/dL — ABNORMAL HIGH (ref 70–99)
Potassium: 3.4 mmol/L — ABNORMAL LOW (ref 3.5–5.1)
Sodium: 136 mmol/L (ref 135–145)

## 2022-07-09 LAB — IRON AND TIBC
Iron: 18 ug/dL — ABNORMAL LOW (ref 28–170)
Saturation Ratios: 12 % (ref 10.4–31.8)
TIBC: 155 ug/dL — ABNORMAL LOW (ref 250–450)
UIBC: 137 ug/dL

## 2022-07-09 LAB — ECHOCARDIOGRAM COMPLETE
AR max vel: 1.39 cm2
AV Area VTI: 1.58 cm2
AV Area mean vel: 1.37 cm2
AV Mean grad: 9.5 mmHg
AV Peak grad: 18.1 mmHg
Ao pk vel: 2.13 m/s
Area-P 1/2: 2.8 cm2
Height: 68 in
S' Lateral: 2.8 cm
Weight: 4800 oz

## 2022-07-09 LAB — CBC
HCT: 38 % (ref 36.0–46.0)
Hemoglobin: 11.4 g/dL — ABNORMAL LOW (ref 12.0–15.0)
MCH: 27.3 pg (ref 26.0–34.0)
MCHC: 30 g/dL (ref 30.0–36.0)
MCV: 90.9 fL (ref 80.0–100.0)
Platelets: 604 10*3/uL — ABNORMAL HIGH (ref 150–400)
RBC: 4.18 MIL/uL (ref 3.87–5.11)
RDW: 14.4 % (ref 11.5–15.5)
WBC: 17.2 10*3/uL — ABNORMAL HIGH (ref 4.0–10.5)
nRBC: 0 % (ref 0.0–0.2)

## 2022-07-09 LAB — FERRITIN: Ferritin: 772 ng/mL — ABNORMAL HIGH (ref 11–307)

## 2022-07-09 LAB — TRANSFERRIN: Transferrin: 111 mg/dL — ABNORMAL LOW (ref 192–382)

## 2022-07-09 MED ORDER — POTASSIUM CHLORIDE CRYS ER 20 MEQ PO TBCR
40.0000 meq | EXTENDED_RELEASE_TABLET | Freq: Two times a day (BID) | ORAL | Status: AC
  Administered 2022-07-09 (×2): 40 meq via ORAL
  Filled 2022-07-09 (×2): qty 2

## 2022-07-09 MED ORDER — PERFLUTREN LIPID MICROSPHERE
1.0000 mL | INTRAVENOUS | Status: AC | PRN
  Administered 2022-07-09: 2 mL via INTRAVENOUS

## 2022-07-09 NOTE — Progress Notes (Signed)
[facility] PROGRESS NOTE   Madison [name] [id number] DOB: 1941/04/15 DOA: 07/08/2022  PCP: Oswaldo Conroy, MD  Brief History/Interval Summary: 82 y.o. 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 Patient had a recent hospitalization, from 07/02/22 to 07/07/22 for right hip cellulitis complicated with sepsis. Patient was treated with IV antibiotic therapy. ID and orthopedics were consulted. Plan to continue oral antibiotic therapy with ciprofloxacin and doxycycline until 08/01/2022.  Patient was at skilled nursing facility.  Has been having increased work of breathing for few days.  Concern was for pulmonary edema.  Patient was hospitalized for further management.  Consultants: None  Procedures: Echocardiogram is pending    Subjective/Interval History: Mentions that her shortness of breath is slightly better today compared to yesterday.  Denies any chest pain.  Has redness on her right hip.  Has also noticed some redness in the left hip area.     Assessment/Plan:  Acute on chronic diastolic CHF Last echocardiogram from 2021 showed preserved LV systolic function and mild aortic stenosis.  Echocardiogram to be repeated. Continue furosemide.  Replace potassium. Strict ins and outs and daily weights.  Acute respiratory failure with hypoxia Secondary to CHF.  Continue oxygen for now.  Wean down to maintain saturations greater than 90%.  Essential hypertension Blood pressure is noted to be stable.  Continue to monitor.  Chronic kidney disease stage IIIa Monitor renal function closely while patient is being diuresed.  Hypokalemia Will be repleted.  Normocytic anemia Likely anemia of chronic disease.  Stable for the most part.  Cellulitis of the right hip Recent admission for same.  Currently on  ciprofloxacin and doxycycline.  Was seen by ID during previous admission.  Was also seen by orthopedics during previous admission due to concern for abscess but orthopedics did not feel that the fluid collection noted on CT scan was an abscess.  Thought to be a collection of blood or seroma.  Did not require any intervention. There is some erythematous changes noted over the left hip as well.  Continue antibiotics for now.  Monitor left hip for now. WBC noted to be elevated.  Acquired hypothyroidism Continue levothyroxine.  GERD Continue with antacids.  Class III obesity Estimated body mass index is 45.61 kg/m as calculated from the following:   Height as of this encounter: 5\' 8"  (1.727 m).   Weight as of this encounter: 136.1 kg.   DVT Prophylaxis: Lovenox Code Status: Full code Family Communication: Discussed with the patient.  No family at bedside Disposition Plan: Return to SNF when medically stable  Status is: Inpatient Remains inpatient appropriate because: Pulmonary edema      Medications: Scheduled:  cholecalciferol  4,000 Units Oral Daily   ciprofloxacin  750 mg Oral BID   doxycycline  100 mg Oral BID   enoxaparin (LOVENOX) injection  40 mg Subcutaneous Q24H   ferrous sulfate  325 mg Oral Q breakfast   furosemide  60 mg Intravenous Q12H   [START ON 07/10/2022] levothyroxine  50 mcg Oral QODAY   mirtazapine  7.5 mg Oral QHS   pantoprazole  40 mg Oral Daily   potassium chloride  40 mEq Oral BID   spironolactone  25 mg Oral Daily   Continuous: [id number] **OR** acetaminophen, albuterol, ondansetron **OR** ondansetron (ZOFRAN) IV, perflutren lipid microspheres (DEFINITY) IV suspension  Antibiotics: Anti-infectives (From admission, onward)  Start     Dose/Rate Route Frequency Ordered Stop   07/08/22 2245  ciprofloxacin (CIPRO) tablet 750 mg        750 mg Oral 2 times daily 07/08/22 2131     07/08/22 2245  doxycycline (VIBRA-TABS) tablet 100 mg         100 mg Oral 2 times daily 07/08/22 2131     07/08/22 1915  vancomycin (VANCOREADY) IVPB 2000 mg/400 mL        2,000 mg 200 mL/hr over 120 Minutes Intravenous  Once 07/08/22 1908 07/08/22 2229   07/08/22 1915  ceFEPIme (MAXIPIME) 2 g in sodium chloride 0.9 % 100 mL IVPB        2 g 200 mL/hr over 30 Minutes Intravenous  Once 07/08/22 1908 07/08/22 2013       Objective:  Vital Signs  Vitals:   07/08/22 2129 07/09/22 0153 07/09/22 0610 07/09/22 0952  BP: (!) 150/46 (!) 158/80 135/62 137/66  Pulse: 81 79 79 74  Resp: 19 19 20 19   Temp: 98.2 F (36.8 C) 98 F (36.7 C) 97.6 F (36.4 C)   TempSrc: Oral Oral Oral   SpO2: 100% 97% 95% 100%  Weight:      Height:        Intake/Output Summary (Last 24 hours) at 07/09/2022 1003 Last data filed at 07/08/2022 2250 Gross per 24 hour  Intake 180 ml  Output --  Net 180 ml   Filed Weights   07/08/22 1946  Weight: 136.1 kg    General appearance: Awake alert.  In no distress Resp: Crackles bilateral bases.  No wheezing or rhonchi.  Mildly tachypneic without use of accessory muscles. Cardio: S1-S2 is normal regular.  No S3-S4.  No rubs murmurs or bruit GI: Abdomen is soft.  Nontender nondistended.  Bowel sounds are present normal.  No masses organomegaly Extremities: Erythema noted over the right hip area but also noted over the left hip area laterally.  Some warmth is present.  There is range of motion of both lower extremities. Neurologic: Alert and oriented x3.  No focal neurological deficits.    Lab Results:  Data Reviewed: I have personally reviewed following labs and reports of the imaging studies  CBC: Recent Labs  Lab 07/03/22 0500 07/04/22 0502 07/05/22 0538 07/06/22 0702 07/08/22 1714 07/08/22 2208 07/09/22 0353  WBC 11.9* 12.6* 11.7* 11.9* 15.6* 16.7* 17.2*  NEUTROABS 10.4* 10.0* 9.5* 9.5* 12.1*  --   --   HGB 9.3* 10.9* 11.4* 10.8* 11.7* 11.8* 11.4*  HCT 31.8* 36.4 38.5 37.0 38.5 39.6 38.0  MCV 94.1 91.0 92.3  91.8 90.2 91.5 90.9  PLT 326 475* 507* 536* 607* 590* 604*    Basic Metabolic Panel: Recent Labs  Lab 07/03/22 0500 07/03/22 0826 07/04/22 0502 07/05/22 0538 07/06/22 0702 07/08/22 1714 07/08/22 2208 07/09/22 0353  NA 122* 131* 134* 135 136 133*  --  136  K 2.7* 3.4* 4.2 3.7 3.4* 3.9  --  3.4*  CL 89* 103 104 106 109 110  --  109  CO2 17* 21* 20* 19* 19* 17*  --  17*  GLUCOSE 94 107* 96 94 106* 112*  --  104*  BUN 16 22 25* 24* 20 17  --  20  CREATININE 0.78 1.12* 1.21* 1.14* 1.01* 1.18* 0.98 1.11*  CALCIUM 6.1* 8.0* 8.2* 8.4* 8.4* 8.2*  --  8.0*  MG 1.5* 2.1 2.4 2.4 2.4  --   --   --   PHOS  --  3.3 3.6 4.3 3.6  --   --   --     GFR: Estimated Creatinine Clearance: 58.2 mL/min (A) (by C-G formula based on SCr of 1.11 mg/dL (H)).  Liver Function Tests: Recent Labs  Lab 07/03/22 0500 07/03/22 0826 07/04/22 0502 07/05/22 0538 07/06/22 0702  AST 17 24 23 30  36  ALT 13 19 17 21 25   ALKPHOS 58 88 71 66 63  BILITOT 5.2* 0.7 0.6 0.6 0.3  PROT 4.6* 6.4* 6.2* 6.3* 6.4*  ALBUMIN 1.6* 2.2* 2.2* 2.2* 2.3*     Coagulation Profile: Recent Labs  Lab 07/03/22 0500  INR 1.6*    Cardiac Enzymes: Recent Labs  Lab 07/06/22 0702  CKTOTAL 12*     CBG: Recent Labs  Lab 07/04/22 1320  GLUCAP 96    Anemia Panel: Recent Labs    07/09/22 0353  FERRITIN 772*  TIBC 155*  IRON 18*    Recent Results (from the past 240 hour(s))  Resp panel by RT-PCR (RSV, Flu A&B, Covid) Anterior Nasal Swab     Status: None   Collection Time: 07/02/22  6:35 PM   Specimen: Anterior Nasal Swab  Result Value Ref Range Status   SARS Coronavirus 2 by RT PCR NEGATIVE NEGATIVE Final    Comment: (NOTE) SARS-CoV-2 target nucleic acids are NOT DETECTED.  The SARS-CoV-2 RNA is generally detectable in upper respiratory specimens during the acute phase of infection. The lowest concentration of SARS-CoV-2 viral copies this assay can detect is 138 copies/mL. A negative result does not  preclude SARS-Cov-2 infection and should not be used as the sole basis for treatment or other patient management decisions. A negative result may occur with  improper specimen collection/handling, submission of specimen other than nasopharyngeal swab, presence of viral mutation(s) within the areas targeted by this assay, and inadequate number of viral copies(<138 copies/mL). A negative result must be combined with clinical observations, patient history, and epidemiological information. The expected result is Negative.  Fact Sheet for Patients:  EntrepreneurPulse.com.au  Fact Sheet for Healthcare Providers:  IncredibleEmployment.be  This test is no t yet approved or cleared by the [place] FDA and  has been authorized for detection and/or diagnosis of SARS-CoV-2 by FDA under an Emergency Use Authorization (EUA). This EUA will remain  in effect (meaning this test can be used) for the duration of the COVID-19 declaration under Section 564(b)(1) of the Act, 21 U.S.C.section 360bbb-3(b)(1), unless the authorization is terminated  or revoked sooner.       Influenza A by PCR NEGATIVE NEGATIVE Final   Influenza B by PCR NEGATIVE NEGATIVE Final    Comment: (NOTE) The Xpert Xpress SARS-CoV-2/FLU/RSV plus assay is intended as an aid in the diagnosis of influenza from Nasopharyngeal swab specimens and should not be used as a sole basis for treatment. Nasal washings and aspirates are unacceptable for Xpert Xpress SARS-CoV-2/FLU/RSV testing.  Fact Sheet for Patients: EntrepreneurPulse.com.au  Fact Sheet for Healthcare Providers: IncredibleEmployment.be  This test is not yet approved or cleared by the [place] FDA and has been authorized for detection and/or diagnosis of SARS-CoV-2 by FDA under an Emergency Use Authorization (EUA). This EUA will remain in effect (meaning this test can be used) for the  duration of the COVID-19 declaration under Section 564(b)(1) of the Act, 21 U.S.C. section 360bbb-3(b)(1), unless the authorization is terminated or revoked.     Resp Syncytial Virus by PCR NEGATIVE NEGATIVE Final    Comment: (NOTE) Fact Sheet for Patients: EntrepreneurPulse.com.au  Fact  Sheet for Healthcare Providers: IncredibleEmployment.be  This test is not yet approved or cleared by the [place] and has been authorized for detection and/or diagnosis of SARS-CoV-2 by FDA under an Emergency Use Authorization (EUA). This EUA will remain in effect (meaning this test can be used) for the duration of the COVID-19 declaration under Section 564(b)(1) of the Act, 21 U.S.C. section 360bbb-3(b)(1), unless the authorization is terminated or revoked.  Performed at [facility], [place] [address] [place], [place] [address]   Culture, blood (routine x 2)     Status: None   Collection Time: 07/02/22 10:00 PM   Specimen: BLOOD  Result Value Ref Range Status   Specimen Description   Final    BLOOD SITE NOT SPECIFIED Performed at [facility] [address], [place], [place] [address]    Special Requests   Final    BOTTLES DRAWN AEROBIC AND ANAEROBIC Blood Culture results may not be optimal due to an inadequate volume of blood received in culture bottles Performed at [facility] [address], [place], [place] [address]    Culture   Final    NO GROWTH 5 DAYS Performed at [facility], [place] [address] [place], [place] [address]    Report Status 07/08/2022 FINAL  Final  Culture, blood (routine x 2)     Status: None   Collection Time: 07/02/22 10:15 PM   Specimen: BLOOD  Result Value Ref Range Status   Specimen Description   Final    BLOOD BLOOD RIGHT HAND Performed at [facility] [address], [place], [place] [address]    Special Requests   Final    BOTTLES DRAWN  AEROBIC AND ANAEROBIC Blood Culture results may not be optimal due to an excessive volume of blood received in culture bottles Performed at [facility] [address] [place], [place] [address]    Culture   Final    NO GROWTH 5 DAYS Performed at [facility], [place] [address], [place], [place] [address]    Report Status 07/08/2022 FINAL  Final  MRSA Next Gen by PCR, Nasal     Status: None   Collection Time: 07/03/22  5:10 PM   Specimen: Nasal Mucosa; Nasal Swab  Result Value Ref Range Status   MRSA by PCR Next Gen NOT DETECTED NOT DETECTED Final    Comment: (NOTE) The GeneXpert MRSA Assay (FDA approved for NASAL specimens only), is one component of a comprehensive MRSA colonization surveillance program. It is not intended to diagnose MRSA infection nor to guide or monitor treatment for MRSA infections. Test performance is not FDA approved in patients less than 38 years old. Performed at [facility], [place] [address], [place], [place] [address]   Resp panel by RT-PCR (RSV, Flu A&B, Covid) Anterior Nasal Swab     Status: None   Collection Time: 07/08/22  5:10 PM   Specimen: Anterior Nasal Swab  Result Value Ref Range Status   SARS Coronavirus 2 by RT PCR NEGATIVE NEGATIVE Final    Comment: (NOTE) SARS-CoV-2 target nucleic acids are NOT DETECTED.  The SARS-CoV-2 RNA is generally detectable in upper respiratory specimens during the acute phase of infection. The lowest concentration of SARS-CoV-2 viral copies this assay can detect is 138 copies/mL. A negative result does not preclude SARS-Cov-2 infection and should not be used as the sole basis for treatment or other patient management decisions. A negative  result may occur with  improper specimen collection/handling, submission of specimen other than nasopharyngeal swab, presence of viral mutation(s) within the areas targeted by this assay, and inadequate number of viral copies(<138  copies/mL). A negative result must be combined with clinical observations, patient history, and epidemiological information. The expected result is Negative.  Fact Sheet for Patients:  EntrepreneurPulse.com.au  Fact Sheet for Healthcare Providers:  IncredibleEmployment.be  This test is no t yet approved or cleared by the [place] FDA and  has been authorized for detection and/or diagnosis of SARS-CoV-2 by FDA under an Emergency Use Authorization (EUA). This EUA will remain  in effect (meaning this test can be used) for the duration of the COVID-19 declaration under Section 564(b)(1) of the Act, 21 U.S.C.section 360bbb-3(b)(1), unless the authorization is terminated  or revoked sooner.       Influenza A by PCR NEGATIVE NEGATIVE Final   Influenza B by PCR NEGATIVE NEGATIVE Final    Comment: (NOTE) The Xpert Xpress SARS-CoV-2/FLU/RSV plus assay is intended as an aid in the diagnosis of influenza from Nasopharyngeal swab specimens and should not be used as a sole basis for treatment. Nasal washings and aspirates are unacceptable for Xpert Xpress SARS-CoV-2/FLU/RSV testing.  Fact Sheet for Patients: EntrepreneurPulse.com.au  Fact Sheet for Healthcare Providers: IncredibleEmployment.be  This test is not yet approved or cleared by the [place] FDA and has been authorized for detection and/or diagnosis of SARS-CoV-2 by FDA under an Emergency Use Authorization (EUA). This EUA will remain in effect (meaning this test can be used) for the duration of the COVID-19 declaration under Section 564(b)(1) of the Act, 21 U.S.C. section 360bbb-3(b)(1), unless the authorization is terminated or revoked.     Resp Syncytial Virus by PCR NEGATIVE NEGATIVE Final    Comment: (NOTE) Fact Sheet for Patients: EntrepreneurPulse.com.au  Fact Sheet for Healthcare  Providers: IncredibleEmployment.be  This test is not yet approved or cleared by the [place] FDA and has been authorized for detection and/or diagnosis of SARS-CoV-2 by FDA under an Emergency Use Authorization (EUA). This EUA will remain in effect (meaning this test can be used) for the duration of the COVID-19 declaration under Section 564(b)(1) of the Act, 21 U.S.C. section 360bbb-3(b)(1), unless the authorization is terminated or revoked.  Performed at [facility], [place] [address], [place], [place] [address]       Radiology Studies: DG Chest Portable 1 View  Result Date: 07/08/2022 CLINICAL DATA:  Short of breath, pneumonia EXAM: PORTABLE CHEST 1 VIEW COMPARISON:  07/06/2022 FINDINGS: Single frontal view of the chest demonstrates stable enlargement of the cardiac silhouette. Multifocal bilateral airspace disease greatest in the right upper and left lower lung zones. Small left effusion is suspected. Continued central vascular congestion. No pneumothorax. No acute bony abnormality. IMPRESSION: 1. Constellation of findings most suggestive of congestive heart failure, with no significant change in volume status since prior study. Underlying pneumonia would be difficult to exclude. Electronically Signed   By: Randa Ngo M.D.   On: 07/08/2022 17:36       LOS: 1 day   [facility] Pager on [email or web address]  07/09/2022, 10:03 AM

## 2022-07-10 DIAGNOSIS — I5033 Acute on chronic diastolic (congestive) heart failure: Secondary | ICD-10-CM | POA: Diagnosis not present

## 2022-07-10 DIAGNOSIS — J9601 Acute respiratory failure with hypoxia: Secondary | ICD-10-CM | POA: Diagnosis not present

## 2022-07-10 DIAGNOSIS — L03115 Cellulitis of right lower limb: Secondary | ICD-10-CM | POA: Diagnosis not present

## 2022-07-10 LAB — BASIC METABOLIC PANEL
Anion gap: 9 (ref 5–15)
BUN: 21 mg/dL (ref 8–23)
CO2: 20 mmol/L — ABNORMAL LOW (ref 22–32)
Calcium: 7.9 mg/dL — ABNORMAL LOW (ref 8.9–10.3)
Chloride: 107 mmol/L (ref 98–111)
Creatinine, Ser: 1.2 mg/dL — ABNORMAL HIGH (ref 0.44–1.00)
GFR, Estimated: 45 mL/min — ABNORMAL LOW (ref >60)
Glucose, Bld: 113 mg/dL — ABNORMAL HIGH (ref 70–99)
Potassium: 3.6 mmol/L (ref 3.5–5.1)
Sodium: 136 mmol/L (ref 135–145)

## 2022-07-10 MED ORDER — POTASSIUM CHLORIDE CRYS ER 20 MEQ PO TBCR
40.0000 meq | EXTENDED_RELEASE_TABLET | Freq: Two times a day (BID) | ORAL | Status: AC
  Administered 2022-07-10 (×2): 40 meq via ORAL
  Filled 2022-07-10 (×2): qty 2

## 2022-07-10 NOTE — Progress Notes (Signed)
[facility] PROGRESS NOTE   Madison [name] [id number] DOB: 02-Dec-1940 DOA: 07/08/2022  PCP: Oswaldo Conroy, MD  Brief History/Interval Summary: 82 y.o. 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 Patient had a recent hospitalization, from 07/02/22 to 07/07/22 for right hip cellulitis complicated with sepsis. Patient was treated with IV antibiotic therapy. ID and orthopedics were consulted. Plan to continue oral antibiotic therapy with ciprofloxacin and doxycycline until 08/01/2022.  Patient was at skilled nursing facility.  Has been having increased work of breathing for few days.  Concern was for pulmonary edema.  Patient was hospitalized for further management.  Consultants: None  Procedures: Echocardiogram    Subjective/Interval History: Patient mentions that her shortness of breath is better.  Denies any chest pain.  No nausea or vomiting.  Continues to be concerned about the redness over both her thighs.      Assessment/Plan:  Acute on chronic diastolic CHF Echocardiogram was done and shows normal systolic function.  It was however a poor quality study. Patient appears to have diuresed well though urine output has not been charted accurately.  We will check chest x-ray tomorrow morning.  Continue furosemide intravenously for today. Supplement potassium. Strict ins and outs and daily weights.  Acute respiratory failure with hypoxia Secondary to CHF.  Continue oxygen for now.  Wean down to maintain saturations greater than 90%.  Cellulitis of the right hip Recent admission for same.  Currently on ciprofloxacin and doxycycline.  Was seen by ID during previous admission.  Was also seen by orthopedics during previous admission due to concern for abscess but orthopedics did not feel that the fluid  collection noted on CT scan was an abscess.  Thought to be a collection of blood or seroma.  Did not require any intervention. Continues to have erythematous changes over the right hip and also over the left hip which according to the patient is new.   This morning patient noted to have excessive drainage around the right hip area.  Apparently there is an area that opened up.  It looks like a fluid collection that was noted on imaging studies has now extruded through an opening.  Apply dressing and monitor for now.  Will not change antibiotics at this time.  Follow-up on WBC tomorrow.  She may benefit from having ID see her again tomorrow.  Essential hypertension Blood pressure is noted to be stable.  Continue to monitor.  Chronic kidney disease stage IIIa Monitor renal function closely while patient is being diuresed.  Hypokalemia Supplement potassium.  Normocytic anemia Likely anemia of chronic disease.  Stable for the most part.  Acquired hypothyroidism Continue levothyroxine.  GERD Continue with antacids.  Class III obesity Estimated body mass index is 45.61 kg/m as calculated from the following:   Height as of this encounter: 5\' 8"  (1.727 m).   Weight as of this encounter: 136.1 kg.   DVT Prophylaxis: Lovenox Code Status: Full code Family Communication: Discussed with the patient.  No family at bedside Disposition Plan: Return to SNF when medically stable  Status is: Inpatient Remains inpatient appropriate because: Pulmonary edema      Medications: Scheduled:  cholecalciferol  4,000 Units Oral Daily   ciprofloxacin  750 mg Oral BID   doxycycline  100 mg Oral BID   enoxaparin (LOVENOX) injection  40 mg Subcutaneous Q24H   ferrous sulfate  325 mg Oral Q breakfast   furosemide  60 mg Intravenous Q12H   levothyroxine  50 mcg Oral QODAY   mirtazapine  7.5 mg Oral QHS   pantoprazole  40 mg Oral Daily   spironolactone  25 mg Oral Daily    Continuous: [id number] **OR** acetaminophen, albuterol, ondansetron **OR** ondansetron (ZOFRAN) IV  Antibiotics: Anti-infectives (From admission, onward)    Start     Dose/Rate Route Frequency Ordered Stop   07/08/22 2245  ciprofloxacin (CIPRO) tablet 750 mg        750 mg Oral 2 times daily 07/08/22 2131     07/08/22 2245  doxycycline (VIBRA-TABS) tablet 100 mg        100 mg Oral 2 times daily 07/08/22 2131     07/08/22 1915  vancomycin (VANCOREADY) IVPB 2000 mg/400 mL        2,000 mg 200 mL/hr over 120 Minutes Intravenous  Once 07/08/22 1908 07/08/22 2229   07/08/22 1915  ceFEPIme (MAXIPIME) 2 g in sodium chloride 0.9 % 100 mL IVPB        2 g 200 mL/hr over 30 Minutes Intravenous  Once 07/08/22 1908 07/08/22 2013       Objective:  Vital Signs  Vitals:   07/09/22 0952 07/09/22 1241 07/09/22 2029 07/10/22 0434  BP: 137/66 (!) 135/57 (!) 136/58 (!) 134/59  Pulse: 74 73 78 75  Resp: 19 20 20    Temp:  97.8 F (36.6 C) 98 F (36.7 C) (!) 97.5 F (36.4 C)  TempSrc:  Oral Oral Oral  SpO2: 100% 99% 99% 97%  Weight:      Height:        Intake/Output Summary (Last 24 hours) at 07/10/2022 0914 Last data filed at 07/10/2022 0837 Gross per 24 hour  Intake 240 ml  Output 900 ml  Net -660 ml    Filed Weights   07/08/22 1946  Weight: 136.1 kg    General appearance: Awake alert.  In no distress Resp: Clear to auscultation bilaterally.  Normal effort Cardio: S1-S2 is normal regular.  No S3-S4.  No rubs murmurs or bruit GI: Abdomen is soft.  Nontender nondistended.  Bowel sounds are present normal.  No masses organomegaly Extremities: Erythema over the right lateral thigh as well as over the left lateral thigh. No obvious focal neurological deficits. Limited range of motion of the lower extremities which is her baseline.   Lab Results:  Data Reviewed: I have personally reviewed following labs and reports of the imaging studies  CBC: Recent Labs  Lab  07/04/22 0502 07/05/22 0538 07/06/22 0702 07/08/22 1714 07/08/22 2208 07/09/22 0353  WBC 12.6* 11.7* 11.9* 15.6* 16.7* 17.2*  NEUTROABS 10.0* 9.5* 9.5* 12.1*  --   --   HGB 10.9* 11.4* 10.8* 11.7* 11.8* 11.4*  HCT 36.4 38.5 37.0 38.5 39.6 38.0  MCV 91.0 92.3 91.8 90.2 91.5 90.9  PLT 475* 507* 536* 607* 590* 604*     Basic Metabolic Panel: Recent Labs  Lab 07/04/22 0502 07/05/22 0538 07/06/22 0702 07/08/22 1714 07/08/22 2208 07/09/22 0353 07/10/22 0424  NA 134* 135 136 133*  --  136 136  K 4.2 3.7 3.4* 3.9  --  3.4* 3.6  CL 104 106 109 110  --  109 107  CO2 20* 19* 19* 17*  --  17* 20*  GLUCOSE 96 94 106* 112*  --  104* 113*  BUN 25* 24* 20 17  --  20 21  CREATININE 1.21* 1.14* 1.01* 1.18* 0.98  1.11* 1.20*  CALCIUM 8.2* 8.4* 8.4* 8.2*  --  8.0* 7.9*  MG 2.4 2.4 2.4  --   --   --   --   PHOS 3.6 4.3 3.6  --   --   --   --      GFR: Estimated Creatinine Clearance: 53.9 mL/min (A) (by C-G formula based on SCr of 1.2 mg/dL (H)).  Liver Function Tests: Recent Labs  Lab 07/04/22 0502 07/05/22 0538 07/06/22 0702  AST 23 30 36  ALT 17 21 25   ALKPHOS 71 66 63  BILITOT 0.6 0.6 0.3  PROT 6.2* 6.3* 6.4*  ALBUMIN 2.2* 2.2* 2.3*       Cardiac Enzymes: Recent Labs  Lab 07/06/22 0702  CKTOTAL 12*      CBG: Recent Labs  Lab 07/04/22 1320  GLUCAP 96     Anemia Panel: Recent Labs    07/09/22 0353  FERRITIN 772*  TIBC 155*  IRON 18*     Recent Results (from the past 240 hour(s))  Resp panel by RT-PCR (RSV, Flu A&B, Covid) Anterior Nasal Swab     Status: None   Collection Time: 07/02/22  6:35 PM   Specimen: Anterior Nasal Swab  Result Value Ref Range Status   SARS Coronavirus 2 by RT PCR NEGATIVE NEGATIVE Final    Comment: (NOTE) SARS-CoV-2 target nucleic acids are NOT DETECTED.  The SARS-CoV-2 RNA is generally detectable in upper respiratory specimens during the acute phase of infection. The lowest concentration of SARS-CoV-2 viral copies  this assay can detect is 138 copies/mL. A negative result does not preclude SARS-Cov-2 infection and should not be used as the sole basis for treatment or other patient management decisions. A negative result may occur with  improper specimen collection/handling, submission of specimen other than nasopharyngeal swab, presence of viral mutation(s) within the areas targeted by this assay, and inadequate number of viral copies(<138 copies/mL). A negative result must be combined with clinical observations, patient history, and epidemiological information. The expected result is Negative.  Fact Sheet for Patients:  EntrepreneurPulse.com.au  Fact Sheet for Healthcare Providers:  IncredibleEmployment.be  This test is no t yet approved or cleared by the [place] FDA and  has been authorized for detection and/or diagnosis of SARS-CoV-2 by FDA under an Emergency Use Authorization (EUA). This EUA will remain  in effect (meaning this test can be used) for the duration of the COVID-19 declaration under Section 564(b)(1) of the Act, 21 U.S.C.section 360bbb-3(b)(1), unless the authorization is terminated  or revoked sooner.       Influenza A by PCR NEGATIVE NEGATIVE Final   Influenza B by PCR NEGATIVE NEGATIVE Final    Comment: (NOTE) The Xpert Xpress SARS-CoV-2/FLU/RSV plus assay is intended as an aid in the diagnosis of influenza from Nasopharyngeal swab specimens and should not be used as a sole basis for treatment. Nasal washings and aspirates are unacceptable for Xpert Xpress SARS-CoV-2/FLU/RSV testing.  Fact Sheet for Patients: EntrepreneurPulse.com.au  Fact Sheet for Healthcare Providers: IncredibleEmployment.be  This test is not yet approved or cleared by the [place] FDA and has been authorized for detection and/or diagnosis of SARS-CoV-2 by FDA under an Emergency Use Authorization (EUA). This EUA  will remain in effect (meaning this test can be used) for the duration of the COVID-19 declaration under Section 564(b)(1) of the Act, 21 U.S.C. section 360bbb-3(b)(1), unless the authorization is terminated or revoked.     Resp Syncytial Virus by PCR NEGATIVE NEGATIVE Final  Comment: (NOTE) Fact Sheet for Patients: EntrepreneurPulse.com.au  Fact Sheet for Healthcare Providers: IncredibleEmployment.be  This test is not yet approved or cleared by the [place] FDA and has been authorized for detection and/or diagnosis of SARS-CoV-2 by FDA under an Emergency Use Authorization (EUA). This EUA will remain in effect (meaning this test can be used) for the duration of the COVID-19 declaration under Section 564(b)(1) of the Act, 21 U.S.C. section 360bbb-3(b)(1), unless the authorization is terminated or revoked.  Performed at [facility], [place] [address], [place], [place] [address]   Culture, blood (routine x 2)     Status: None   Collection Time: 07/02/22 10:00 PM   Specimen: BLOOD  Result Value Ref Range Status   Specimen Description   Final    BLOOD SITE NOT SPECIFIED Performed at [facility] [address] [place], [place] [address]    Special Requests   Final    BOTTLES DRAWN AEROBIC AND ANAEROBIC Blood Culture results may not be optimal due to an inadequate volume of blood received in culture bottles Performed at [facility] [address] [place], [place] [address]    Culture   Final    NO GROWTH 5 DAYS Performed at [facility], [place] [address] [place], [place] [address]    Report Status 07/08/2022 FINAL  Final  Culture, blood (routine x 2)     Status: None   Collection Time: 07/02/22 10:15 PM   Specimen: BLOOD  Result Value Ref Range Status   Specimen Description   Final    BLOOD BLOOD RIGHT HAND Performed at [facility] [address],  [place], [place] [address]    Special Requests   Final    BOTTLES DRAWN AEROBIC AND ANAEROBIC Blood Culture results may not be optimal due to an excessive volume of blood received in culture bottles Performed at [facility] [address] [place], [place] [address]    Culture   Final    NO GROWTH 5 DAYS Performed at [facility], [place] [address] [place], [place] [address]    Report Status 07/08/2022 FINAL  Final  MRSA Next Gen by PCR, Nasal     Status: None   Collection Time: 07/03/22  5:10 PM   Specimen: Nasal Mucosa; Nasal Swab  Result Value Ref Range Status   MRSA by PCR Next Gen NOT DETECTED NOT DETECTED Final    Comment: (NOTE) The GeneXpert MRSA Assay (FDA approved for NASAL specimens only), is one component of a comprehensive MRSA colonization surveillance program. It is not intended to diagnose MRSA infection nor to guide or monitor treatment for MRSA infections. Test performance is not FDA approved in patients less than 73 years old. Performed at [facility], [place] [address] [place], [place] [address]   Resp panel by RT-PCR (RSV, Flu A&B, Covid) Anterior Nasal Swab     Status: None   Collection Time: 07/08/22  5:10 PM   Specimen: Anterior Nasal Swab  Result Value Ref Range Status   SARS Coronavirus 2 by RT PCR NEGATIVE NEGATIVE Final    Comment: (NOTE) SARS-CoV-2 target nucleic acids are NOT DETECTED.  The SARS-CoV-2 RNA is generally detectable in upper respiratory specimens during the acute phase of infection. The lowest concentration of SARS-CoV-2 viral copies this assay can detect is 138 copies/mL. A negative result does not preclude SARS-Cov-2 infection and should not be used as the sole basis  for treatment or other patient management decisions. A negative result may occur with  improper specimen collection/handling, submission of specimen other than nasopharyngeal swab, presence of viral mutation(s) within the areas  targeted by this assay, and inadequate number of viral copies(<138 copies/mL). A negative result must be combined with clinical observations, patient history, and epidemiological information. The expected result is Negative.  Fact Sheet for Patients:  EntrepreneurPulse.com.au  Fact Sheet for Healthcare Providers:  IncredibleEmployment.be  This test is no t yet approved or cleared by the [place] FDA and  has been authorized for detection and/or diagnosis of SARS-CoV-2 by FDA under an Emergency Use Authorization (EUA). This EUA will remain  in effect (meaning this test can be used) for the duration of the COVID-19 declaration under Section 564(b)(1) of the Act, 21 U.S.C.section 360bbb-3(b)(1), unless the authorization is terminated  or revoked sooner.       Influenza A by PCR NEGATIVE NEGATIVE Final   Influenza B by PCR NEGATIVE NEGATIVE Final    Comment: (NOTE) The Xpert Xpress SARS-CoV-2/FLU/RSV plus assay is intended as an aid in the diagnosis of influenza from Nasopharyngeal swab specimens and should not be used as a sole basis for treatment. Nasal washings and aspirates are unacceptable for Xpert Xpress SARS-CoV-2/FLU/RSV testing.  Fact Sheet for Patients: EntrepreneurPulse.com.au  Fact Sheet for Healthcare Providers: IncredibleEmployment.be  This test is not yet approved or cleared by the [place] FDA and has been authorized for detection and/or diagnosis of SARS-CoV-2 by FDA under an Emergency Use Authorization (EUA). This EUA will remain in effect (meaning this test can be used) for the duration of the COVID-19 declaration under Section 564(b)(1) of the Act, 21 U.S.C. section 360bbb-3(b)(1), unless the authorization is terminated or revoked.     Resp Syncytial Virus by PCR NEGATIVE NEGATIVE Final    Comment: (NOTE) Fact Sheet for  Patients: EntrepreneurPulse.com.au  Fact Sheet for Healthcare Providers: IncredibleEmployment.be  This test is not yet approved or cleared by the [place] FDA and has been authorized for detection and/or diagnosis of SARS-CoV-2 by FDA under an Emergency Use Authorization (EUA). This EUA will remain in effect (meaning this test can be used) for the duration of the COVID-19 declaration under Section 564(b)(1) of the Act, 21 U.S.C. section 360bbb-3(b)(1), unless the authorization is terminated or revoked.  Performed at [facility], [place] [address] [place], [place] [address]       Radiology Studies: ECHOCARDIOGRAM COMPLETE  Result Date: 07/09/2022    ECHOCARDIOGRAM REPORT   Patient Name:   Madison [name] Date of Exam: 07/09/2022 Medical Rec #:  [id number]     Height:       68.0 in Accession #:    [id number]    Weight:       300.0 lb Date of Birth:  08/28/1940     BSA:          2.428 m Patient Age:    83 years      BP:           135/62 mmHg Patient Gender: F             HR:           73 bpm. Exam Location:  Inpatient Procedure: 2D Echo, Cardiac Doppler, Color Doppler, Strain Analysis and            Intracardiac Opacification Agent Indications:    Dyspnea R06.00  History:        Patient has prior history  of Echocardiogram examinations, most                 recent 02/24/2020. CHF, CKD III; Risk Factors:Former Smoker and                 Hypertension.  Sonographer:    Wilkie Aye RVT RCS Referring Phys: [id number] [place]  Sonographer Comments: Suboptimal parasternal window, suboptimal apical window, Technically difficult study due to poor echo windows and patient is obese. Image acquisition challenging due to respiratory motion and Image acquisition challenging due to patient body habitus. IMPRESSIONS  1. Left ventricular ejection fraction, by estimation, is 70 to 75%. The left ventricle has hyperdynamic function. The left ventricle  has no regional wall motion abnormalities. There is mild left ventricular hypertrophy of the basal-septal segment. Left ventricular diastolic parameters are indeterminate.  2. Right ventricular systolic function is normal. The right ventricular size is normal.  3. The mitral valve is normal in structure. No evidence of mitral valve regurgitation. No evidence of mitral stenosis.  4. The aortic valve is normal in structure. Aortic valve regurgitation is not visualized. No aortic stenosis is present.  5. The inferior vena cava is normal in size with greater than 50% respiratory variability, suggesting right atrial pressure of 3 mmHg. FINDINGS  Left Ventricle: Left ventricular ejection fraction, by estimation, is 70 to 75%. The left ventricle has hyperdynamic function. The left ventricle has no regional wall motion abnormalities. The left ventricular internal cavity size was normal in size. There is mild left ventricular hypertrophy of the basal-septal segment. Left ventricular diastolic parameters are indeterminate. Right Ventricle: The right ventricular size is normal. No increase in right ventricular wall thickness. Right ventricular systolic function is normal. Left Atrium: Left atrial size was normal in size. Right Atrium: Right atrial size was normal in size. Pericardium: There is no evidence of pericardial effusion. Mitral Valve: The mitral valve is normal in structure. No evidence of mitral valve regurgitation. No evidence of mitral valve stenosis. Tricuspid Valve: The tricuspid valve is normal in structure. Tricuspid valve regurgitation is not demonstrated. No evidence of tricuspid stenosis. Aortic Valve: The aortic valve is normal in structure. Aortic valve regurgitation is not visualized. No aortic stenosis is present. Aortic valve mean gradient measures 9.5 mmHg. Aortic valve peak gradient measures 18.1 mmHg. Aortic valve area, by VTI measures 1.58 cm. Pulmonic Valve: The pulmonic valve was normal in  structure. Pulmonic valve regurgitation is not visualized. No evidence of pulmonic stenosis. Aorta: The aortic root is normal in size and structure. Venous: The inferior vena cava is normal in size with greater than 50% respiratory variability, suggesting right atrial pressure of 3 mmHg. IAS/Shunts: No atrial level shunt detected by color flow Doppler.  LEFT VENTRICLE PLAX 2D LVIDd:         4.50 cm   Diastology LVIDs:         2.80 cm   LV e' medial:    6.42 cm/s LV PW:         1.00 cm   LV E/e' medial:  12.3 LV IVS:        1.20 cm   LV e' lateral:   9.79 cm/s LVOT diam:     1.60 cm   LV E/e' lateral: 8.0 LV SV:         62 LV SV Index:   26 LVOT Area:     2.01 cm  RIGHT VENTRICLE  IVC RV Basal diam:  3.90 cm     IVC diam: 1.50 cm RV Mid diam:    3.40 cm RV S prime:     20.30 cm/s TAPSE (M-mode): 2.5 cm LEFT ATRIUM           Index        RIGHT ATRIUM           Index LA diam:      3.20 cm 1.32 cm/m   RA Area:     16.80 cm LA Vol (A2C): 71.7 ml 29.53 ml/m  RA Volume:   41.50 ml  17.09 ml/m LA Vol (A4C): 64.6 ml 26.61 ml/m  AORTIC VALVE AV Area (Vmax):    1.39 cm AV Area (Vmean):   1.37 cm AV Area (VTI):     1.58 cm AV Vmax:           213.00 cm/s AV Vmean:          140.500 cm/s AV VTI:            0.392 m AV Peak Grad:      18.1 mmHg AV Mean Grad:      9.5 mmHg LVOT Vmax:         147.00 cm/s LVOT Vmean:        95.700 cm/s LVOT VTI:          0.308 m LVOT/AV VTI ratio: 0.79  AORTA Ao Root diam: 2.20 cm Ao Asc diam:  3.00 cm MITRAL VALVE               TRICUSPID VALVE MV Area (PHT): 2.80 cm    TR Peak grad:   18.3 mmHg MV Decel Time: 271 msec    TR Vmax:        214.00 cm/s MV E velocity: 78.80 cm/s MV A velocity: 55.80 cm/s  SHUNTS MV E/A ratio:  1.41        Systemic VTI:  0.31 m                            Systemic Diam: 1.60 cm Candee Furbish MD Electronically signed by Candee Furbish MD Signature Date/Time: 07/09/2022/12:27:40 PM    Final    DG Chest Portable 1 View  Result Date: 07/08/2022 CLINICAL DATA:   Short of breath, pneumonia EXAM: PORTABLE CHEST 1 VIEW COMPARISON:  07/06/2022 FINDINGS: Single frontal view of the chest demonstrates stable enlargement of the cardiac silhouette. Multifocal bilateral airspace disease greatest in the right upper and left lower lung zones. Small left effusion is suspected. Continued central vascular congestion. No pneumothorax. No acute bony abnormality. IMPRESSION: 1. Constellation of findings most suggestive of congestive heart failure, with no significant change in volume status since prior study. Underlying pneumonia would be difficult to exclude. Electronically Signed   By: Randa Ngo M.D.   On: 07/08/2022 17:36       LOS: 2 days   [facility] Pager on [email or web address]  07/10/2022, 9:14 AM

## 2022-07-10 NOTE — Plan of Care (Signed)
°  Problem: Education: °Goal: Knowledge of General Education information will improve °Description: Including pain rating scale, medication(s)/side effects and non-pharmacologic comfort measures °Outcome: Progressing °  °Problem: Health Behavior/Discharge Planning: °Goal: Ability to manage health-related needs will improve °Outcome: Progressing °  °Problem: Clinical Measurements: °Goal: Ability to maintain clinical measurements within normal limits will improve °Outcome: Progressing °Goal: Respiratory complications will improve °Outcome: Progressing °  °Problem: Activity: °Goal: Risk for activity intolerance will decrease °Outcome: Progressing °  °Problem: Nutrition: °Goal: Adequate nutrition will be maintained °Outcome: Progressing °  °

## 2022-07-11 ENCOUNTER — Inpatient Hospital Stay (HOSPITAL_COMMUNITY): Payer: Medicare Other

## 2022-07-11 DIAGNOSIS — L03115 Cellulitis of right lower limb: Secondary | ICD-10-CM | POA: Diagnosis not present

## 2022-07-11 DIAGNOSIS — I5033 Acute on chronic diastolic (congestive) heart failure: Secondary | ICD-10-CM | POA: Diagnosis not present

## 2022-07-11 DIAGNOSIS — J9601 Acute respiratory failure with hypoxia: Secondary | ICD-10-CM | POA: Diagnosis not present

## 2022-07-11 LAB — CBC
HCT: 39.4 % (ref 36.0–46.0)
Hemoglobin: 11.7 g/dL — ABNORMAL LOW (ref 12.0–15.0)
MCH: 26.5 pg (ref 26.0–34.0)
MCHC: 29.7 g/dL — ABNORMAL LOW (ref 30.0–36.0)
MCV: 89.1 fL (ref 80.0–100.0)
Platelets: 601 10*3/uL — ABNORMAL HIGH (ref 150–400)
RBC: 4.42 MIL/uL (ref 3.87–5.11)
RDW: 14.4 % (ref 11.5–15.5)
WBC: 12.8 10*3/uL — ABNORMAL HIGH (ref 4.0–10.5)
nRBC: 0 % (ref 0.0–0.2)

## 2022-07-11 LAB — BASIC METABOLIC PANEL
Anion gap: 9 (ref 5–15)
BUN: 20 mg/dL (ref 8–23)
CO2: 19 mmol/L — ABNORMAL LOW (ref 22–32)
Calcium: 8 mg/dL — ABNORMAL LOW (ref 8.9–10.3)
Chloride: 106 mmol/L (ref 98–111)
Creatinine, Ser: 1.21 mg/dL — ABNORMAL HIGH (ref 0.44–1.00)
GFR, Estimated: 45 mL/min — ABNORMAL LOW (ref >60)
Glucose, Bld: 111 mg/dL — ABNORMAL HIGH (ref 70–99)
Potassium: 3.5 mmol/L (ref 3.5–5.1)
Sodium: 134 mmol/L — ABNORMAL LOW (ref 135–145)

## 2022-07-11 MED ORDER — POTASSIUM CHLORIDE CRYS ER 20 MEQ PO TBCR
40.0000 meq | EXTENDED_RELEASE_TABLET | Freq: Once | ORAL | Status: AC
  Administered 2022-07-11: 40 meq via ORAL
  Filled 2022-07-11: qty 2

## 2022-07-11 MED ORDER — ENOXAPARIN SODIUM 60 MG/0.6ML IJ SOSY
60.0000 mg | PREFILLED_SYRINGE | INTRAMUSCULAR | Status: DC
  Administered 2022-07-11 – 2022-07-14 (×4): 60 mg via SUBCUTANEOUS
  Filled 2022-07-11 (×4): qty 0.6

## 2022-07-11 MED ORDER — IOHEXOL 300 MG/ML  SOLN
100.0000 mL | Freq: Once | INTRAMUSCULAR | Status: AC | PRN
  Administered 2022-07-11: 100 mL via INTRAVENOUS

## 2022-07-11 MED ORDER — FUROSEMIDE 10 MG/ML IJ SOLN
80.0000 mg | Freq: Two times a day (BID) | INTRAMUSCULAR | Status: DC
  Administered 2022-07-11 – 2022-07-13 (×6): 80 mg via INTRAVENOUS
  Filled 2022-07-11 (×6): qty 8

## 2022-07-11 NOTE — Plan of Care (Signed)
  Problem: Clinical Measurements: Goal: Respiratory complications will improve Outcome: Progressing   Problem: Activity: Goal: Risk for activity intolerance will decrease Outcome: Progressing   Problem: Nutrition: Goal: Adequate nutrition will be maintained Outcome: Progressing   Problem: Pain Managment: Goal: General experience of comfort will improve Outcome: Progressing   Problem: Safety: Goal: Ability to remain free from injury will improve Outcome: Progressing   

## 2022-07-11 NOTE — Consult Note (Signed)
[facility]                                                                                        Patient Identification: Patient Name: Madison [name] MRN: [id number] Admit Date: 07/08/2022  4:42 PM Today's Date: 07/11/2022 Reason for consult: Rt hip cellulitis and ? 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   # acute hypoxic respiratory failure 2/2 pul oedema, less likely pna - primary managing with IV diuretics # Rt hip cellulitis with new bursting of purulent fluid from rt lateral thigh - redness appears to be more or less the same as prior exam, there is also component of her fluid overload   # left thigh cellulitis - mild redness in lateral thigh, does not appear to involve other sites or rashes, less likely allergic     Recommendations  Continue doxycycline and PO ciprofloxacin. Both of these abtx have good bone penetration/bioavailability  I will order RT hip CT w contrast given new opening in rt thigh with purulent appearing fluid Xray left femur to r/o any deeper infection  Will need Ortho re-evaluate with new findings  Monitor CBC, BMP  D/w primary and ID Pharm D  Rest of the management as per the primary team. Please call with questions or concerns.  Thank you for the consult  Rosiland Oz, MD Infectious Disease Physician [facility] [address] 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 with placement of antibiotic beads ( OR cx 12/29/20 with propionibacterium, CT guided aspiration 12/11/20 PsA) s/p prolonged IV zosyn followed by po doxycyline and suppression but off for approx 6 months, recently discharged on 3/15 after being hospitalized for right hip cellulitis/fluid collection, seen by myself as well as ortho and discharged on doxycycline and ciprofloxacin and after brief IV course who presented to the ED on 3/15 for shortness of breath with chest x-ray concerning for pulmonary edema versus  pneumonia  At ED, she was afebrile Labs remarkable for Cr 1.18, WBC 15.6 Imaging as below   Per notes, patient started to have excessive drainage in the rt hip area  and an area opening up in the rt thigh yesterday.with new redness in the left lateral thigh   ROS: General- Denies fever, chills, loss of appetite and loss of weight HEENT - Denies headache, blurry vision, neck pain, sinus pain Chest - Denies any chest pain and cough, SOB+ CVS- Denies any dizziness/lightheadedness, syncopal  attacks, palpitations Abdomen- Denies any nausea, vomiting, abdominal pain, hematochezia and diarrhea Neuro - Denies any weakness, numbness, tingling sensation Psych - Denies any changes in mood irritability or depressive symptoms GU- Denies any burning, dysuria, hematuria or increased frequency of urination MSKskin- soreness in the rt hip, thigh and left lateral thigh   Past Medical History:  Diagnosis Date   Benign hypertension with chronic kidney disease, stage III ([facility])    Overview:  Last Assessment & Plan:  Usually the patient is hypertensive. However today her blood pressure is [id number] systolic. She has been feeling fatigued with some lightheadedness. Part of this may be related to her lower blood pressure. Her pressure may be improved with her decrease in salt and fluid intake. I've instructed her to put her lisinopril/hctz on hold. 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 It was possibly reentrant. She does well with diltiazem. No change in therapy.   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   Thyroid disease    Urinary incontinence    Past Surgical History:   Procedure Laterality Date   CATARACT EXTRACTION Left 8/11   CATARACT EXTRACTION Right 3/12   CHOLECYSTECTOMY  1994   Copsilotomy Laser Treatment Left 6/12   eye   ELBOW SURGERY  8/12   EYE SURGERY Left 09/2008   macular hole   EYE SURGERY Right 12/11   Lumbar Infusion  2011   MOLE REMOVAL  06/17/2015   TOTAL KNEE ARTHROPLASTY Left 6/11   TOTAL KNEE ARTHROPLASTY Right 06/13/2011     Scheduled Meds:  cholecalciferol  4,000 Units Oral Daily   ciprofloxacin  750 mg Oral BID   doxycycline  100 mg Oral BID   [START ON 07/12/2022] enoxaparin (LOVENOX) injection  60 mg Subcutaneous Q24H   ferrous sulfate  325 mg Oral Q breakfast   furosemide  80 mg Intravenous Q12H   levothyroxine  50 mcg Oral QODAY   mirtazapine  7.5 mg Oral QHS   pantoprazole  40 mg Oral Daily   potassium chloride  40 mEq Oral Once   spironolactone  25  mg Oral Daily   Continuous Infusions: PRN Meds:.acetaminophen **OR** acetaminophen, albuterol, ondansetron **OR** ondansetron (ZOFRAN) IV  Allergies  Allergen Reactions   Keflex [Cephalexin] Diarrhea   Macrobid [Nitrofurantoin] Hives   Amoxicillin Rash   Social History   Socioeconomic History   Marital status: Married    Spouse name: Not on file   Number of children: Not on file   Years of education: Not on file   Highest education level: Not on file  Occupational History   Not on file  Tobacco Use   Smoking status: Former   Smokeless tobacco: Never  Vaping Use   Vaping Use: Never used  Substance and Sexual Activity   Alcohol use: No   Drug use: No   Sexual activity: Not on file  Other Topics Concern   Not on file  Social History Narrative   Not on file   Social Determinants of Health   Financial Resource Strain: Not on file  Food Insecurity: No Food Insecurity (07/08/2022)   Hunger Vital Sign    Worried About Running Out of Food in the Last Year: Never true    Ran Out of Food in the Last Year: Never true  Transportation Needs: No  Transportation Needs (07/08/2022)   PRAPARE - Hydrologist (Medical): No    Lack of Transportation (Non-Medical): No  Physical Activity: Not on file  Stress: Not on file  Social Connections: Not on file  Intimate Partner Violence: Not At Risk (07/08/2022)   Humiliation, Afraid, Rape, and Kick questionnaire    Fear of Current or Ex-Partner: No    Emotionally Abused: No    Physically Abused: No    Sexually Abused: No   Family History  Problem Relation Age of Onset   High blood pressure Mother    Diabetes Mother    Heart Problems Father    Diabetes Father    Prostate cancer Father    Heart attack Father    Heart attack Paternal Grandmother    Heart attack Maternal Grandfather    Heart attack Paternal Uncle    Stroke Neg Hx     Vitals BP (!) 151/75 (BP Location: Right Arm)   Pulse 75   Temp 97.9 F (36.6 C) (Oral)   Resp 20   Ht 5\' 8"  (1.727 m)   Wt 134.4 kg   SpO2 95%   BMI 45.05 kg/m    Physical Exam Constitutional: Morbidly obese female lying in the bed on nasal cannula     Comments:   Cardiovascular:     Rate and Rhythm: Normal rate and regular rhythm.     Heart sounds: s1s2  Pulmonary:     Effort: Pulmonary effort is normal on nasal cannula     Comments: decreased breath sounds bilaterally  Abdominal:     Palpations: Abdomen is soft.     Tenderness: Distended and nontender  Musculoskeletal:        General: Right hip with an open wound in the right lateral thigh with whitish/purulent material coming out.  Mild tenderness but no fluctuance or crepitus.                     Left hip has mild redness in the left lateral thigh but no crepitus or fluctuance.                    Difficult to assess range of motion of hip due to her habitus  RT  lateral Hip    Left thigh     Skin:    Comments: no rashes except as above   Neurological:     General: awake,alert and oriented, follows commands appropriately   Psychiatric:         Mood and Affect: Mood normal.    Pertinent Microbiology Results for orders placed or performed during the hospital encounter of 07/08/22  Resp panel by RT-PCR (RSV, Flu A&B, Covid) Anterior Nasal Swab     Status: None   Collection Time: 07/08/22  5:10 PM   Specimen: Anterior Nasal Swab  Result Value Ref Range Status   SARS Coronavirus 2 by RT PCR NEGATIVE NEGATIVE Final    Comment: (NOTE) SARS-CoV-2 target nucleic acids are NOT DETECTED.  The SARS-CoV-2 RNA is generally detectable in upper respiratory specimens during the acute phase of infection. The lowest concentration of SARS-CoV-2 viral copies this assay can detect is 138 copies/mL. A negative result does not preclude SARS-Cov-2 infection and should not be used as the sole basis for treatment or other patient management decisions. A negative result may occur with  improper specimen collection/handling, submission of specimen other than nasopharyngeal swab, presence of viral mutation(s) within the areas targeted by this assay, and inadequate number of viral copies(<138 copies/mL). A negative result must be combined with clinical observations, patient history, and epidemiological information. The expected result is Negative.  Fact Sheet for Patients:  EntrepreneurPulse.com.au  Fact Sheet for Healthcare Providers:  IncredibleEmployment.be  This test is no t yet approved or cleared by the [place] FDA and  has been authorized for detection and/or diagnosis of SARS-CoV-2 by FDA under an Emergency Use Authorization (EUA). This EUA will remain  in effect (meaning this test can be used) for the duration of the COVID-19 declaration under Section 564(b)(1) of the Act, 21 U.S.C.section 360bbb-3(b)(1), unless the authorization is terminated  or revoked sooner.       Influenza A by PCR NEGATIVE NEGATIVE Final   Influenza B by PCR NEGATIVE NEGATIVE Final    Comment: (NOTE) The Xpert Xpress  SARS-CoV-2/FLU/RSV plus assay is intended as an aid in the diagnosis of influenza from Nasopharyngeal swab specimens and should not be used as a sole basis for treatment. Nasal washings and aspirates are unacceptable for Xpert Xpress SARS-CoV-2/FLU/RSV testing.  Fact Sheet for Patients: EntrepreneurPulse.com.au  Fact Sheet for Healthcare Providers: IncredibleEmployment.be  This test is not yet approved or cleared by the [place] FDA and has been authorized for detection and/or diagnosis of SARS-CoV-2 by FDA under an Emergency Use Authorization (EUA). This EUA will remain in effect (meaning this test can be used) for the duration of the COVID-19 declaration under Section 564(b)(1) of the Act, 21 U.S.C. section 360bbb-3(b)(1), unless the authorization is terminated or revoked.     Resp Syncytial Virus by PCR NEGATIVE NEGATIVE Final    Comment: (NOTE) Fact Sheet for Patients: EntrepreneurPulse.com.au  Fact Sheet for Healthcare Providers: IncredibleEmployment.be  This test is not yet approved or cleared by the [place] FDA and has been authorized for detection and/or diagnosis of SARS-CoV-2 by FDA under an Emergency Use Authorization (EUA). This EUA will remain in effect (meaning this test can be used) for the duration of the COVID-19 declaration under Section 564(b)(1) of the Act, 21 U.S.C. section 360bbb-3(b)(1), unless the authorization is terminated or revoked.  Performed at [facility], [place] [address] [place], [place] [address]     Pertinent Lab seen by me:    Latest  Ref Rng & Units 07/11/2022    4:11 AM 07/09/2022    3:53 AM 07/08/2022   10:08 PM  CBC  WBC 4.0 - 10.5 K/uL 12.8  17.2  16.7   Hemoglobin 12.0 - 15.0 g/dL 11.7  11.4  11.8   Hematocrit 36.0 - 46.0 % 39.4  38.0  39.6   Platelets 150 - 400 K/uL 601  604  590       Latest Ref Rng & Units 07/11/2022     4:11 AM 07/10/2022    4:24 AM 07/09/2022    3:53 AM  CMP  Glucose 70 - 99 mg/dL 111  113  104   BUN 8 - 23 mg/dL 20  21  20    Creatinine 0.44 - 1.00 mg/dL 1.21  1.20  1.11   Sodium 135 - 145 mmol/L 134  136  136   Potassium 3.5 - 5.1 mmol/L 3.5  3.6  3.4   Chloride 98 - 111 mmol/L 106  107  109   CO2 22 - 32 mmol/L 19  20  17    Calcium 8.9 - 10.3 mg/dL 8.0  7.9  8.0     Pertinent Imagings/Other Imagings Plain films and CT images have been personally visualized and interpreted; radiology reports have been reviewed. Decision making incorporated into the Impression / Recommendations.  DG CHEST PORT 1 VIEW  Result Date: 07/11/2022 CLINICAL DATA:  Pulmonary edema. EXAM: PORTABLE CHEST 1 VIEW COMPARISON:  07/08/2022 FINDINGS: Prominent lung markings particularly in the left upper chest. Heart size is upper limits of normal but stable. Trachea is midline. Atherosclerotic calcifications at the aortic arch. Slightly improved aeration at the left lung base. Negative for a pneumothorax. No acute bone abnormality. IMPRESSION: 1. Slightly improved aeration at the left lung base. 2. Again noted are prominent lung markings particularly in left upper lung. Findings could be associated with asymmetric interstitial edema. Electronically Signed   By: Markus Daft M.D.   On: 07/11/2022 08:30   ECHOCARDIOGRAM COMPLETE  Result Date: 07/09/2022    ECHOCARDIOGRAM REPORT   Patient Name:   VALEKA MASSAR Date of Exam: 07/09/2022 Medical Rec #:  [id number]     Height:       68.0 in Accession #:    [id number]    Weight:       300.0 lb Date of Birth:  Aug 11, 1940     BSA:          2.428 m Patient Age:    10 years      BP:           135/62 mmHg Patient Gender: F             HR:           73 bpm. Exam Location:  Inpatient Procedure: 2D Echo, Cardiac Doppler, Color Doppler, Strain Analysis and            Intracardiac Opacification Agent Indications:    Dyspnea R06.00  History:        Patient has prior history of Echocardiogram  examinations, most                 recent 02/24/2020. CHF, CKD III; Risk Factors:Former Smoker and                 Hypertension.  Sonographer:    Wilkie Aye RVT RCS Referring Phys: [id number] [place]  Sonographer Comments: Suboptimal parasternal window, suboptimal apical window, Technically difficult study due to poor echo  windows and patient is obese. Image acquisition challenging due to respiratory motion and Image acquisition challenging due to patient body habitus. IMPRESSIONS  1. Left ventricular ejection fraction, by estimation, is 70 to 75%. The left ventricle has hyperdynamic function. The left ventricle has no regional wall motion abnormalities. There is mild left ventricular hypertrophy of the basal-septal segment. Left ventricular diastolic parameters are indeterminate.  2. Right ventricular systolic function is normal. The right ventricular size is normal.  3. The mitral valve is normal in structure. No evidence of mitral valve regurgitation. No evidence of mitral stenosis.  4. The aortic valve is normal in structure. Aortic valve regurgitation is not visualized. No aortic stenosis is present.  5. The inferior vena cava is normal in size with greater than 50% respiratory variability, suggesting right atrial pressure of 3 mmHg. FINDINGS  Left Ventricle: Left ventricular ejection fraction, by estimation, is 70 to 75%. The left ventricle has hyperdynamic function. The left ventricle has no regional wall motion abnormalities. The left ventricular internal cavity size was normal in size. There is mild left ventricular hypertrophy of the basal-septal segment. Left ventricular diastolic parameters are indeterminate. Right Ventricle: The right ventricular size is normal. No increase in right ventricular wall thickness. Right ventricular systolic function is normal. Left Atrium: Left atrial size was normal in size. Right Atrium: Right atrial size was normal in size. Pericardium: There is no evidence  of pericardial effusion. Mitral Valve: The mitral valve is normal in structure. No evidence of mitral valve regurgitation. No evidence of mitral valve stenosis. Tricuspid Valve: The tricuspid valve is normal in structure. Tricuspid valve regurgitation is not demonstrated. No evidence of tricuspid stenosis. Aortic Valve: The aortic valve is normal in structure. Aortic valve regurgitation is not visualized. No aortic stenosis is present. Aortic valve mean gradient measures 9.5 mmHg. Aortic valve peak gradient measures 18.1 mmHg. Aortic valve area, by VTI measures 1.58 cm. Pulmonic Valve: The pulmonic valve was normal in structure. Pulmonic valve regurgitation is not visualized. No evidence of pulmonic stenosis. Aorta: The aortic root is normal in size and structure. Venous: The inferior vena cava is normal in size with greater than 50% respiratory variability, suggesting right atrial pressure of 3 mmHg. IAS/Shunts: No atrial level shunt detected by color flow Doppler.  LEFT VENTRICLE PLAX 2D LVIDd:         4.50 cm   Diastology LVIDs:         2.80 cm   LV e' medial:    6.42 cm/s LV PW:         1.00 cm   LV E/e' medial:  12.3 LV IVS:        1.20 cm   LV e' lateral:   9.79 cm/s LVOT diam:     1.60 cm   LV E/e' lateral: 8.0 LV SV:         62 LV SV Index:   26 LVOT Area:     2.01 cm  RIGHT VENTRICLE             IVC RV Basal diam:  3.90 cm     IVC diam: 1.50 cm RV Mid diam:    3.40 cm RV S prime:     20.30 cm/s TAPSE (M-mode): 2.5 cm LEFT ATRIUM           Index        RIGHT ATRIUM           Index LA diam:      3.20  cm 1.32 cm/m   RA Area:     16.80 cm LA Vol (A2C): 71.7 ml 29.53 ml/m  RA Volume:   41.50 ml  17.09 ml/m LA Vol (A4C): 64.6 ml 26.61 ml/m  AORTIC VALVE AV Area (Vmax):    1.39 cm AV Area (Vmean):   1.37 cm AV Area (VTI):     1.58 cm AV Vmax:           213.00 cm/s AV Vmean:          140.500 cm/s AV VTI:            0.392 m AV Peak Grad:      18.1 mmHg AV Mean Grad:      9.5 mmHg LVOT Vmax:         147.00  cm/s LVOT Vmean:        95.700 cm/s LVOT VTI:          0.308 m LVOT/AV VTI ratio: 0.79  AORTA Ao Root diam: 2.20 cm Ao Asc diam:  3.00 cm MITRAL VALVE               TRICUSPID VALVE MV Area (PHT): 2.80 cm    TR Peak grad:   18.3 mmHg MV Decel Time: 271 msec    TR Vmax:        214.00 cm/s MV E velocity: 78.80 cm/s MV A velocity: 55.80 cm/s  SHUNTS MV E/A ratio:  1.41        Systemic VTI:  0.31 m                            Systemic Diam: 1.60 cm Candee Furbish MD Electronically signed by Candee Furbish MD Signature Date/Time: 07/09/2022/12:27:40 PM    Final    DG Chest Portable 1 View  Result Date: 07/08/2022 CLINICAL DATA:  Short of breath, pneumonia EXAM: PORTABLE CHEST 1 VIEW COMPARISON:  07/06/2022 FINDINGS: Single frontal view of the chest demonstrates stable enlargement of the cardiac silhouette. Multifocal bilateral airspace disease greatest in the right upper and left lower lung zones. Small left effusion is suspected. Continued central vascular congestion. No pneumothorax. No acute bony abnormality. IMPRESSION: 1. Constellation of findings most suggestive of congestive heart failure, with no significant change in volume status since prior study. Underlying pneumonia would be difficult to exclude. Electronically Signed   By: Randa Ngo M.D.   On: 07/08/2022 17:36   DG CHEST PORT 1 VIEW  Result Date: 07/06/2022 CLINICAL DATA:  Shortness of breath. EXAM: PORTABLE CHEST 1 VIEW COMPARISON:  None of 07/04/2022 FINDINGS: Stable cardiomediastinal contours. Lung volumes are low. Mild scratch set increase interstitial markings are identified bilaterally concerning for pulmonary edema. Cannot exclude small pleural effusions. Decreased aeration to the left base may represent atelectasis or airspace disease. IMPRESSION: 1. Suspect mild congestive heart failure. 2. Decreased aeration to the left base may represent atelectasis or airspace disease. Electronically Signed   By: Kerby Moors M.D.   On: 07/06/2022 08:05    DG CHEST PORT 1 VIEW  Result Date: 07/04/2022 CLINICAL DATA:  Shortness of breath. EXAM: PORTABLE CHEST 1 VIEW COMPARISON:  07/03/2018 for FINDINGS: Stable cardiomediastinal contours. Low lung volumes. Unchanged left base atelectasis. No signs of pleural effusion, interstitial edema or consolidation. IMPRESSION: No change in aeration to the lungs compared with previous exam. Electronically Signed   By: Kerby Moors M.D.   On: 07/04/2022 08:18   DG CHEST PORT 1 VIEW  Result Date: 07/03/2022 CLINICAL DATA:  Shortness of breath. EXAM: PORTABLE CHEST 1 VIEW COMPARISON:  07/02/2022 and prior studies FINDINGS: This is a low volume study. The cardiomediastinal silhouette is unchanged. Mild LEFT basilar opacities are noted. There is no evidence of pneumothorax or pleural effusion. No acute bony abnormalities are noted. IMPRESSION: Mild LEFT basilar opacities, favor atelectasis but infection/pneumonia is not excluded. Electronically Signed   By: Margarette Canada M.D.   On: 07/03/2022 16:30   CT Hip Right Wo Contrast  Result Date: 07/02/2022 CLINICAL DATA:  Septic arthritis suspected. Hip x-ray done. Hip pain and redness in the right hip. EXAM: CT OF THE RIGHT HIP WITHOUT CONTRAST TECHNIQUE: Multidetector CT imaging of the right hip was performed according to the standard protocol. Multiplanar CT image reconstructions were also generated. RADIATION DOSE REDUCTION: This exam was performed according to the departmental dose-optimization program which includes automated exposure control, adjustment of the mA and/or kV according to patient size and/or use of iterative reconstruction technique. 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 Increased callus formation about the acetabulum with redemonstrated fragmentation along the medial aspect of the acetabulum. Unchanged  fragmentation of the right greater trochanter. MR nondisplaced longitudinal fracture and osseous fragmentation about the proximal femoral diaphysis. The proximal end of the femoral diaphysis has shifted anteriorly compared to 06/08/2021. Unchanged mixed soft tissue and fluid components at the site of the prior femoral head and neck component compatible with granulation tissue. There may be a central fluid component/seroma though this is similar to decreased from 06/08/2021. Ligaments Suboptimally assessed by CT. Muscles and Tendons Muscle atrophy of the gluteal muscles. Soft tissues Similar postsurgical scarring in the subcutaneous fat anterior to the right hip. Increased size of the thick-walled fluid collection in the lateral right subcutaneous fat extending medially to the greater trochanter fragmentation. This is not completely visualized on this exam and extends laterally beyond the lateral aspect of the scan. Suggestion of adjacent stranding lateral to the fluid collection though this is largely excluded from the field-of-view. IMPRESSION: 1. 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 There is some interval healing change since 06/08/2021. 2. Unchanged granulation tissue and possible central seroma about the proximal end of the femoral diaphysis and acetabulum. 3. Increased size of the thick-walled fluid collection in the lateral right subcutaneous fat extending medially to the greater trochanter fragmentation. This is not completely visualized on this exam and extends laterally beyond the field-of-view. There is the suggestion of subcutaneous stranding lateral to the fluid collection. Clinical correlation is recommended to exclude cellulitis. If there is concern for abscess, consider further evaluation with ultrasound and/or fluid sampling. It is unclear if the large subcutaneous fluid collection communicates with the joint space. Electronically Signed   By: Placido Sou M.D.   On: 07/02/2022 23:43   DG Hip Unilat With Pelvis 2-3 Views Right  Result Date: 07/02/2022 CLINICAL DATA:  Hip pain with redness and swelling to the right hip EXAM: DG HIP (WITH OR WITHOUT PELVIS) 2-3V RIGHT COMPARISON:  CT of the right hip 06/08/2021 FINDINGS: Findings appear similar to CT 06/08/2021 given differences in technique. Changes from prior right hip arthroplasty with subsequent hardware removal. Fragmentation of the right greater trochanter. Cortical thickening and sclerosis about the proximal femoral shaft and acetabulum. No definite acute fracture. The soft tissue thickening and fluid about the right hip was better demonstrated on CT. Contrast  within the bladder IMPRESSION: No definite acute fracture. Similar changes about the right hip from right hip arthroplasty and subsequent heart were removal when compared with CT 06/08/2021. Electronically Signed   By: Placido Sou M.D.   On: 07/02/2022 22:01   CT Angio Chest PE W/Cm &/Or Wo Cm  Result Date: 07/02/2022 CLINICAL DATA:  Pulmonary embolism suspected.  High probability. EXAM: CT ANGIOGRAPHY CHEST WITH CONTRAST TECHNIQUE: Multidetector CT imaging of the chest was performed using the standard protocol during bolus administration of intravenous contrast. Multiplanar CT image reconstructions and MIPs were obtained to evaluate the vascular anatomy. RADIATION DOSE REDUCTION: This exam was performed according to the departmental dose-optimization program which includes automated exposure control, adjustment of the mA and/or kV according to patient size and/or use of iterative reconstruction technique. CONTRAST:  65mL OMNIPAQUE IOHEXOL 350 MG/ML SOLN COMPARISON:  Portable chest today, portable chest 10/24/2020. FINDINGS: Cardiovascular: There is mild cardiomegaly and small pericardial effusion. There are scattered three-vessel coronary artery calcifications. The pulmonary veins are normal caliber. The pulmonary arteries are normal  caliber and clear through the segmental divisions. Due to breathing motion, the subsegmental arterial bed is obscured and not evaluated. There is mild aortic atherosclerosis and tortuosity without aneurysm, stenosis or dissection. The great vessels are clear. Mediastinum/Nodes: There is a moderate-sized hiatal hernia with gastroesophageal reflux to the level of the aortic arch. The esophageal thickness is normal. The trachea and main bronchi are patent. There are minimal retained secretions in the right main bronchus. There is no intrathoracic or axillary adenopathy. Axillary spaces are clear. Normal thyroid. Lungs/Pleura: There small symmetric bilateral layering pleural effusions and adjacent coarse atelectatic markings in the bilateral lower lobes. There is mild posterior atelectasis in the upper lung fields. Reticulated scarring noted both apices. No focal infiltrate or nodule is seen through the breathing motion. There is no pulmonary edema. Upper Abdomen: Status post cholecystectomy. No biliary dilatation. Moderate hepatic steatosis. No acute upper abdominal findings. Musculoskeletal: There is thoracic kyphosis, osteopenia, spondylosis and multilevel degenerative discs, multilevel bridging enthesopathy. Bilateral acromiohumeral abutment consistent with chronic rotator cuff arthropathy and degenerative tears is also noted with bilateral supraspinatus fatty atrophy. No acute or other significant musculoskeletal findings. Review of the MIP images confirms the above findings. IMPRESSION: 1. No evidence of arterial dilatation through the segmental divisions. The subsegmental arterial bed is obscured by breathing motion. 2. Cardiomegaly with small pericardial effusion and three-vessel coronary atherosclerosis. 3. Small bilateral pleural effusions with adjacent atelectasis in the lower lobes. 4. Moderate-sized hiatal hernia with gastroesophageal reflux to the level of the aortic arch. Aspiration precautions may be  indicated. 5. Aortic atherosclerosis. 6. Moderate hepatic steatosis. 7. Osteopenia, kyphosis and degenerative change. 8. 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 Aortic Atherosclerosis (ICD10-I70.0). Electronically Signed   By: Telford Nab M.D.   On: 07/02/2022 21:32   DG Chest Port 1 View  Result Date: 07/02/2022 CLINICAL DATA:  Sternal chest pain, worse with movement EXAM: PORTABLE CHEST 1 VIEW COMPARISON:  Chest radiograph dated 10/24/2020 FINDINGS: Low lung volumes with bronchovascular crowding. Bibasilar and bilateral perihilar patchy opacities. 0w0 No pleural effusion or pneumothorax. The heart size and mediastinal contours are within normal limits. The visualized skeletal structures are unremarkable. IMPRESSION: 1. Low lung volumes with bronchovascular crowding. 2. Bibasilar and bilateral perihilar patchy opacities, which could be due to atelectasis or infection. Electronically Signed   By: Darrin Nipper M.D.   On: 07/02/2022 19:08     I spent  85 minutes for this patient encounter including review of prior medical records/discussing diagnostics and treatment plan with the patient/family/coordinate care with primary/other specialits with greater than 50% of time in face to face encounter.   Electronically signed by:   Rosiland Oz, MD Infectious Disease Physician [facility] Pager: [phone]

## 2022-07-11 NOTE — Evaluation (Signed)
Clinical/Bedside Swallow Evaluation Patient Details  Name: NASHA SERVANTEZ MRN: [id number] Date of Birth: March 03, 1941  Today's Date: 07/11/2022 Time: SLP Start Time (ACUTE ONLY): 1325 SLP Stop Time (ACUTE ONLY): 1340 SLP Time Calculation (min) (ACUTE ONLY): 15 min  Past Medical History:  Past Medical History:  Diagnosis Date   Benign hypertension with chronic kidney disease, stage III (HCC)    Overview:  Last Assessment & Plan:  Usually the patient is hypertensive. However today her blood pressure is [id number] systolic. She has been feeling fatigued with some lightheadedness. Part of this may be related to her lower blood pressure. Her pressure may be improved with her decrease in salt and fluid intake. I've instructed her to put her lisinopril/hctz on hold. 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 It was possibly reentrant. She does well with diltiazem. No change in therapy.   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   Thyroid disease    Urinary incontinence    Past Surgical History:  Past Surgical History:  Procedure Laterality Date   CATARACT EXTRACTION Left 8/11    CATARACT EXTRACTION Right 3/12   CHOLECYSTECTOMY  1994   Copsilotomy Laser Treatment Left 6/12   eye   ELBOW SURGERY  8/12   EYE SURGERY Left 09/2008   macular hole   EYE SURGERY Right 12/11   Lumbar Infusion  2011   MOLE REMOVAL  06/17/2015   TOTAL KNEE ARTHROPLASTY Left 6/11   TOTAL KNEE ARTHROPLASTY Right 06/13/2011   HPI:  82 y.o. 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 Patient had a recent hospitalization, from 07/02/22 to 07/07/22 for right hip cellulitis complicated with sepsis. Patient was treated with IV antibiotic therapy. ID and orthopedics were consulted. Plan to continue oral antibiotic therapy with ciprofloxacin and doxycycline until 08/01/2022.  Patient was at skilled nursing facility.  Has been having increased work of breathing for few days.  Concern was for pulmonary edema.    Assessment / Plan / Recommendation  Clinical Impression  Pt reports some difficulty with pills, says she often vomits them up if she takes many at once. Says she must always crush the potassium and the taste of it also nauseates her. She already has a good awareness of upright positioning and taking lots of water with meds. Possible that pt has some esophageal dysphagia with pills. SLP only suggested she try crushing the meds in pudding or something that will cover up the taste more than applesauce. She  exhibits no oral dysphagia but is currently on a mechanical soft diet. She says she is very tired of the food here and at [facility]. She would like greater variety and specifically says she would like her husband to bring her a taco. She is wary of difficult to digest foods like raw vegetables, but still could masticate any texture. Will liberalize diet to regular to allow for a greater variety of food choices. No further SLP f/u needed. SLP  Visit Diagnosis: Dysphagia, unspecified (R13.10)    Aspiration Risk  Risk for inadequate nutrition/hydration    Diet Recommendation Regular;Thin liquid   Liquid Administration via: Cup;Straw Medication Administration: Crushed with puree Supervision: Patient able to self feed Compensations: Follow solids with liquid Postural Changes: Seated upright at 90 degrees;Remain upright for at least 30 minutes after po intake    Other  Recommendations Oral Care Recommendations: Oral care BID    Recommendations for follow up therapy are one component of a multi-disciplinary discharge planning process, led by the attending physician.  Recommendations may be updated based on patient status, additional functional criteria and insurance authorization.  Follow up Recommendations        Assistance Recommended at Discharge    Functional Status Assessment    Frequency and Duration            Prognosis        Swallow Study   General HPI: 82 y.o. 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 Patient had a recent hospitalization, from 07/02/22 to 07/07/22 for right hip cellulitis complicated with sepsis. Patient was treated with IV antibiotic therapy. ID and orthopedics were consulted. Plan to continue oral antibiotic therapy with ciprofloxacin and doxycycline until 08/01/2022.  Patient was at skilled nursing facility.  Has been having increased work of breathing for few days.  Concern was for pulmonary edema. Type of Study: Bedside Swallow Evaluation Previous Swallow Assessment: none Diet Prior to this Study: Dysphagia 3 (mechanical soft);Thin liquids (Level 0) Temperature Spikes Noted: No Respiratory Status: Room air History of Recent Intubation: No Behavior/Cognition: Alert;Cooperative;Pleasant mood Oral Cavity Assessment: Within  Functional Limits Oral Care Completed by SLP: No Oral Cavity - Dentition: Adequate natural dentition Vision: Functional for self-feeding Self-Feeding Abilities: Able to feed self Patient Positioning: Upright in bed Baseline Vocal Quality: Normal Volitional Cough: Strong Volitional Swallow: Able to elicit    Oral/Motor/Sensory Function Overall Oral Motor/Sensory Function: Within functional limits   Ice Chips     Thin Liquid Thin Liquid: Within functional limits    Nectar Thick Nectar Thick Liquid: Not tested   Honey Thick Honey Thick Liquid: Not tested   Puree Puree: Within functional limits   Solid     Solid: Within functional limits      Pilar Westergaard, Katherene Ponto 07/11/2022,2:27 PM

## 2022-07-11 NOTE — Progress Notes (Signed)
[facility] PROGRESS NOTE   Madison [name] [id number] DOB: 12-18-1940 DOA: 07/08/2022  PCP: Oswaldo Conroy, MD  Brief History/Interval Summary: 82 y.o. 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 Patient had a recent hospitalization, from 07/02/22 to 07/07/22 for right hip cellulitis complicated with sepsis. Patient was treated with IV antibiotic therapy. ID and orthopedics were consulted. Plan to continue oral antibiotic therapy with ciprofloxacin and doxycycline until 08/01/2022.  Patient was at skilled nursing facility.  Has been having increased work of breathing for few days.  Concern was for pulmonary edema.  Patient was hospitalized for further management.  Consultants: None  Procedures: Echocardiogram    Subjective/Interval History: Patient mentions that her shortness of breath has improved though still not back to her usual.  Very concerned about her infection in the thighs.  Husband is at the bedside today.      Assessment/Plan:  Acute on chronic diastolic CHF Echocardiogram was done and shows normal systolic function.  It was however a poor quality study. Patient appears to have diuresed with furosemide.  Weight however has not changed much today compared to yesterday. Chest x-ray was done and shows some improvement but still there is evidence for pulmonary edema. Continue with intravenous furosemide.  Will increase the dose to 80 mg twice a day. Continue to supplement potassium. Strict ins and outs and daily weights.  Acute respiratory failure with hypoxia Secondary to CHF.  Continue oxygen for now.  Wean down to maintain saturations greater than 90%.  Cellulitis of the right hip Recent admission for same.  Currently on ciprofloxacin and doxycycline.  Was seen by ID during previous admission.   Was also seen by orthopedics during previous admission due to concern for abscess but orthopedics did not feel that the fluid collection noted on CT scan was an abscess.  Thought to be a collection of blood or seroma.  Did not require any intervention. On 3/17 patient noted to have excessive drainage around the right hip area.  Apparently there is an area that opened up.  It looks like a fluid collection that was noted on imaging studies has now extruded through an opening.   Patient and husband concerned that the infection is not improving. Will request ID to reevaluate patient.  And also for presumably new erythema involving the left thigh.  WBC however noted to be better today at 12.8.    Essential hypertension Blood pressure is noted to be stable.  Continue to monitor.  Chronic kidney disease stage IIIa Monitor renal function closely while patient is being diuresed.  Hypokalemia Supplement potassium.  Normocytic anemia Likely anemia of chronic disease.  Stable for the most part.  Acquired hypothyroidism Continue levothyroxine.  GERD Continue with antacids.  Class III obesity Estimated body mass index is 45.05 kg/m as calculated from the following:   Height as of this encounter: 5\' 8"  (1.727 m).   Weight as of this encounter: 134.4 kg.   DVT Prophylaxis: Lovenox Code Status: Full code Family Communication: Discussed with patient and her husband. Disposition Plan: Return to SNF when medically stable  Status is: Inpatient Remains inpatient appropriate because: Pulmonary edema      Medications: Scheduled:  cholecalciferol  4,000 Units Oral Daily   ciprofloxacin  750 mg Oral BID   doxycycline  100 mg Oral BID   enoxaparin (LOVENOX) injection  40  mg Subcutaneous Q24H   ferrous sulfate  325 mg Oral Q breakfast   furosemide  60 mg Intravenous Q12H   levothyroxine  50 mcg Oral QODAY   mirtazapine  7.5 mg Oral QHS   pantoprazole  40 mg Oral Daily   spironolactone  25 mg  Oral Daily   Continuous: [id number] **OR** acetaminophen, albuterol, ondansetron **OR** ondansetron (ZOFRAN) IV  Antibiotics: Anti-infectives (From admission, onward)    Start     Dose/Rate Route Frequency Ordered Stop   07/08/22 2245  ciprofloxacin (CIPRO) tablet 750 mg        750 mg Oral 2 times daily 07/08/22 2131     07/08/22 2245  doxycycline (VIBRA-TABS) tablet 100 mg        100 mg Oral 2 times daily 07/08/22 2131     07/08/22 1915  vancomycin (VANCOREADY) IVPB 2000 mg/400 mL        2,000 mg 200 mL/hr over 120 Minutes Intravenous  Once 07/08/22 1908 07/08/22 2229   07/08/22 1915  ceFEPIme (MAXIPIME) 2 g in sodium chloride 0.9 % 100 mL IVPB        2 g 200 mL/hr over 30 Minutes Intravenous  Once 07/08/22 1908 07/08/22 2013       Objective:  Vital Signs  Vitals:   07/10/22 1300 07/10/22 2144 07/11/22 0500 07/11/22 0633  BP: (!) 145/86 (!) 133/56  (!) 151/75  Pulse: 80 77  75  Resp: 20 20  20   Temp: 98.3 F (36.8 C) 98 F (36.7 C)  97.9 F (36.6 C)  TempSrc: Tympanic Oral  Oral  SpO2: 99% 97%  95%  Weight:   134.4 kg   Height:        Intake/Output Summary (Last 24 hours) at 07/11/2022 1007 Last data filed at 07/11/2022 0900 Gross per 24 hour  Intake 120 ml  Output 2650 ml  Net -2530 ml    Filed Weights   07/08/22 1946 07/10/22 0921 07/11/22 0500  Weight: 136.1 kg 134.6 kg 134.4 kg    General appearance: Awake alert.  In no distress Resp: Crackles at the bases.  No wheezing or rhonchi.  Mildly tachypneic. Cardio: S1-S2 is normal regular.  No S3-S4.  No rubs murmurs or bruit GI: Abdomen is soft.  Nontender nondistended.  Bowel sounds are present normal.  No masses organomegaly Extremities: Drainage from the right lateral thigh.  Erythema is noted.  Erythema also noted in the left lateral thigh.  Warmth to touch. Neurologic: Alert and oriented x3.  No focal neurological deficits.     Lab Results:  Data Reviewed: I have personally reviewed  following labs and reports of the imaging studies  CBC: Recent Labs  Lab 07/05/22 0538 07/06/22 0702 07/08/22 1714 07/08/22 2208 07/09/22 0353 07/11/22 0411  WBC 11.7* 11.9* 15.6* 16.7* 17.2* 12.8*  NEUTROABS 9.5* 9.5* 12.1*  --   --   --   HGB 11.4* 10.8* 11.7* 11.8* 11.4* 11.7*  HCT 38.5 37.0 38.5 39.6 38.0 39.4  MCV 92.3 91.8 90.2 91.5 90.9 89.1  PLT 507* 536* 607* 590* 604* 601*     Basic Metabolic Panel: Recent Labs  Lab 07/05/22 0538 07/06/22 0702 07/08/22 1714 07/08/22 2208 07/09/22 0353 07/10/22 0424 07/11/22 0411  NA 135 136 133*  --  136 136 134*  K 3.7 3.4* 3.9  --  3.4* 3.6 3.5  CL 106 109 110  --  109 107 106  CO2 19* 19* 17*  --  17* 20* 19*  GLUCOSE  94 106* 112*  --  104* 113* 111*  BUN 24* 20 17  --  20 21 20   CREATININE 1.14* 1.01* 1.18* 0.98 1.11* 1.20* 1.21*  CALCIUM 8.4* 8.4* 8.2*  --  8.0* 7.9* 8.0*  MG 2.4 2.4  --   --   --   --   --   PHOS 4.3 3.6  --   --   --   --   --      GFR: Estimated Creatinine Clearance: 53 mL/min (A) (by C-G formula based on SCr of 1.21 mg/dL (H)).  Liver Function Tests: Recent Labs  Lab 07/05/22 0538 07/06/22 0702  AST 30 36  ALT 21 25  ALKPHOS 66 63  BILITOT 0.6 0.3  PROT 6.3* 6.4*  ALBUMIN 2.2* 2.3*       Cardiac Enzymes: Recent Labs  Lab 07/06/22 0702  CKTOTAL 12*      CBG: Recent Labs  Lab 07/04/22 1320  GLUCAP 96     Anemia Panel: Recent Labs    07/09/22 0353  FERRITIN 772*  TIBC 155*  IRON 18*     Recent Results (from the past 240 hour(s))  Resp panel by RT-PCR (RSV, Flu A&B, Covid) Anterior Nasal Swab     Status: None   Collection Time: 07/02/22  6:35 PM   Specimen: Anterior Nasal Swab  Result Value Ref Range Status   SARS Coronavirus 2 by RT PCR NEGATIVE NEGATIVE Final    Comment: (NOTE) SARS-CoV-2 target nucleic acids are NOT DETECTED.  The SARS-CoV-2 RNA is generally detectable in upper respiratory specimens during the acute phase of infection. The  lowest concentration of SARS-CoV-2 viral copies this assay can detect is 138 copies/mL. A negative result does not preclude SARS-Cov-2 infection and should not be used as the sole basis for treatment or other patient management decisions. A negative result may occur with  improper specimen collection/handling, submission of specimen other than nasopharyngeal swab, presence of viral mutation(s) within the areas targeted by this assay, and inadequate number of viral copies(<138 copies/mL). A negative result must be combined with clinical observations, patient history, and epidemiological information. The expected result is Negative.  Fact Sheet for Patients:  EntrepreneurPulse.com.au  Fact Sheet for Healthcare Providers:  IncredibleEmployment.be  This test is no t yet approved or cleared by the [place] FDA and  has been authorized for detection and/or diagnosis of SARS-CoV-2 by FDA under an Emergency Use Authorization (EUA). This EUA will remain  in effect (meaning this test can be used) for the duration of the COVID-19 declaration under Section 564(b)(1) of the Act, 21 U.S.C.section 360bbb-3(b)(1), unless the authorization is terminated  or revoked sooner.       Influenza A by PCR NEGATIVE NEGATIVE Final   Influenza B by PCR NEGATIVE NEGATIVE Final    Comment: (NOTE) The Xpert Xpress SARS-CoV-2/FLU/RSV plus assay is intended as an aid in the diagnosis of influenza from Nasopharyngeal swab specimens and should not be used as a sole basis for treatment. Nasal washings and aspirates are unacceptable for Xpert Xpress SARS-CoV-2/FLU/RSV testing.  Fact Sheet for Patients: EntrepreneurPulse.com.au  Fact Sheet for Healthcare Providers: IncredibleEmployment.be  This test is not yet approved or cleared by the [place] FDA and has been authorized for detection and/or diagnosis of SARS-CoV-2 by FDA under  an Emergency Use Authorization (EUA). This EUA will remain in effect (meaning this test can be used) for the duration of the COVID-19 declaration under Section 564(b)(1) of the Act, 21 U.S.C.  section 360bbb-3(b)(1), unless the authorization is terminated or revoked.     Resp Syncytial Virus by PCR NEGATIVE NEGATIVE Final    Comment: (NOTE) Fact Sheet for Patients: EntrepreneurPulse.com.au  Fact Sheet for Healthcare Providers: IncredibleEmployment.be  This test is not yet approved or cleared by the [place] FDA and has been authorized for detection and/or diagnosis of SARS-CoV-2 by FDA under an Emergency Use Authorization (EUA). This EUA will remain in effect (meaning this test can be used) for the duration of the COVID-19 declaration under Section 564(b)(1) of the Act, 21 U.S.C. section 360bbb-3(b)(1), unless the authorization is terminated or revoked.  Performed at [facility], [place] [address], [place], [place] [address]   Culture, blood (routine x 2)     Status: None   Collection Time: 07/02/22 10:00 PM   Specimen: BLOOD  Result Value Ref Range Status   Specimen Description   Final    BLOOD SITE NOT SPECIFIED Performed at [place] [address], [place], [place] [address]    Special Requests   Final    BOTTLES DRAWN AEROBIC AND ANAEROBIC Blood Culture results may not be optimal due to an inadequate volume of blood received in culture bottles Performed at [place] [address] [place], [place] [address]    Culture   Final    NO GROWTH 5 DAYS Performed at [facility], [place] [address] [place], [place] [address]    Report Status 07/08/2022 FINAL  Final  Culture, blood (routine x 2)     Status: None   Collection Time: 07/02/22 10:15 PM   Specimen: BLOOD  Result Value Ref Range Status   Specimen Description   Final    BLOOD BLOOD RIGHT HAND Performed at [place] [address] [place], [place] [address]    Special Requests   Final    BOTTLES DRAWN AEROBIC AND ANAEROBIC Blood Culture results may not be optimal due to an excessive volume of blood received in culture bottles Performed at [place] [address], [place], [place] [address]    Culture   Final    NO GROWTH 5 DAYS Performed at [facility], [place] [address], [place], [place] [address]    Report Status 07/08/2022 FINAL  Final  MRSA Next Gen by PCR, Nasal     Status: None   Collection Time: 07/03/22  5:10 PM   Specimen: Nasal Mucosa; Nasal Swab  Result Value Ref Range Status   MRSA by PCR Next Gen NOT DETECTED NOT DETECTED Final    Comment: (NOTE) The GeneXpert MRSA Assay (FDA approved for NASAL specimens only), is one component of a comprehensive MRSA colonization surveillance program. It is not intended to diagnose MRSA infection nor to guide or monitor treatment for MRSA infections. Test performance is not FDA approved in patients less than 57 years old. Performed at [facility], [place] [address], [place], [place] [address]   Resp panel by RT-PCR (RSV, Flu A&B, Covid) Anterior Nasal Swab     Status: None   Collection Time: 07/08/22  5:10 PM   Specimen: Anterior Nasal Swab  Result Value Ref Range Status   SARS Coronavirus 2 by RT PCR NEGATIVE NEGATIVE Final    Comment: (NOTE) SARS-CoV-2 target nucleic acids are NOT DETECTED.  The SARS-CoV-2 RNA is generally detectable in upper respiratory specimens during the acute phase of infection. The lowest concentration of SARS-CoV-2 viral copies  this assay can detect is 138 copies/mL. A negative result does not preclude SARS-Cov-2 infection and should not be used as the sole basis for treatment or other patient management decisions. A negative result may occur with  improper specimen collection/handling, submission of specimen other than nasopharyngeal swab, presence  of viral mutation(s) within the areas targeted by this assay, and inadequate number of viral copies(<138 copies/mL). A negative result must be combined with clinical observations, patient history, and epidemiological information. The expected result is Negative.  Fact Sheet for Patients:  EntrepreneurPulse.com.au  Fact Sheet for Healthcare Providers:  IncredibleEmployment.be  This test is no t yet approved or cleared by the [place] FDA and  has been authorized for detection and/or diagnosis of SARS-CoV-2 by FDA under an Emergency Use Authorization (EUA). This EUA will remain  in effect (meaning this test can be used) for the duration of the COVID-19 declaration under Section 564(b)(1) of the Act, 21 U.S.C.section 360bbb-3(b)(1), unless the authorization is terminated  or revoked sooner.       Influenza A by PCR NEGATIVE NEGATIVE Final   Influenza B by PCR NEGATIVE NEGATIVE Final    Comment: (NOTE) The Xpert Xpress SARS-CoV-2/FLU/RSV plus assay is intended as an aid in the diagnosis of influenza from Nasopharyngeal swab specimens and should not be used as a sole basis for treatment. Nasal washings and aspirates are unacceptable for Xpert Xpress SARS-CoV-2/FLU/RSV testing.  Fact Sheet for Patients: EntrepreneurPulse.com.au  Fact Sheet for Healthcare Providers: IncredibleEmployment.be  This test is not yet approved or cleared by the [place] FDA and has been authorized for detection and/or diagnosis of SARS-CoV-2 by FDA under an Emergency Use Authorization (EUA). This EUA will remain in effect (meaning this test can be used) for the duration of the COVID-19 declaration under Section 564(b)(1) of the Act, 21 U.S.C. section 360bbb-3(b)(1), unless the authorization is terminated or revoked.     Resp Syncytial Virus by PCR NEGATIVE NEGATIVE Final    Comment: (NOTE) Fact Sheet for  Patients: EntrepreneurPulse.com.au  Fact Sheet for Healthcare Providers: IncredibleEmployment.be  This test is not yet approved or cleared by the [place] FDA and has been authorized for detection and/or diagnosis of SARS-CoV-2 by FDA under an Emergency Use Authorization (EUA). This EUA will remain in effect (meaning this test can be used) for the duration of the COVID-19 declaration under Section 564(b)(1) of the Act, 21 U.S.C. section 360bbb-3(b)(1), unless the authorization is terminated or revoked.  Performed at [facility], [place] [address], [place], [place] [address]       Radiology Studies: DG CHEST PORT 1 VIEW  Result Date: 07/11/2022 CLINICAL DATA:  Pulmonary edema. EXAM: PORTABLE CHEST 1 VIEW COMPARISON:  07/08/2022 FINDINGS: Prominent lung markings particularly in the left upper chest. Heart size is upper limits of normal but stable. Trachea is midline. Atherosclerotic calcifications at the aortic arch. Slightly improved aeration at the left lung base. Negative for a pneumothorax. No acute bone abnormality. IMPRESSION: 1. Slightly improved aeration at the left lung base. 2. Again noted are prominent lung markings particularly in left upper lung. Findings could be associated with asymmetric interstitial edema. Electronically Signed   By: Markus Daft M.D.   On: 07/11/2022 08:30       LOS: 3 days   [facility] Pager on [email or web address]  07/11/2022, 10:07 AM

## 2022-07-11 NOTE — Consult Note (Signed)
WOC Nurse Consult Note: Reason for Consult: right hip Has had multiple surgeries, absent hip at this time. Was followed at [facility]. [facility] has seen patient most recently 07/03/22 Madison [name] is an 82 y.o. female.  HPI: Patient known to me from prior evaluation of her very complex surgical history involving her right hip.  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  After lengthy discussions including possible evaluation at [facility] no further surgery has been planned. Wound type: surgical, non healing  Pressure Injury POA: NA Measurement: see nursing flow sheets Wound bed: appears clean and pink Drainage (amount, consistency, odor) nursing has documented drainage as copious and purulent which is typical with wounds of this nature over an previously infected joint.  Periwound: some redness; severe epibole (chronic wound) Dressing procedure/placement/frequency: Cleanse right hip wound with saline Cover/ pack with silver hydrofiber (Aquacel Ag+) to absorb exudate and due to recalcitrant nature of the wound. Top with foam for drainage absorption as well.  Re consult if needed, will not follow at this time. Thanks  Indica Marcott [employer], RN,CWOCN, CNS, [place] [phone])

## 2022-07-11 NOTE — Care Management Important Message (Signed)
Important Message  Patient Details IM Letter given. Name: AKILA HAYASHI MRN: [id number] Date of Birth: April 06, 1941   Medicare Important Message Given:  Yes     Kerin Salen 07/11/2022, 12:40 PM

## 2022-07-12 ENCOUNTER — Other Ambulatory Visit (HOSPITAL_COMMUNITY): Payer: Self-pay

## 2022-07-12 DIAGNOSIS — I5033 Acute on chronic diastolic (congestive) heart failure: Secondary | ICD-10-CM | POA: Diagnosis not present

## 2022-07-12 DIAGNOSIS — J9601 Acute respiratory failure with hypoxia: Secondary | ICD-10-CM | POA: Diagnosis not present

## 2022-07-12 DIAGNOSIS — L03115 Cellulitis of right lower limb: Secondary | ICD-10-CM | POA: Diagnosis not present

## 2022-07-12 LAB — BASIC METABOLIC PANEL
Anion gap: 11 (ref 5–15)
BUN: 20 mg/dL (ref 8–23)
CO2: 20 mmol/L — ABNORMAL LOW (ref 22–32)
Calcium: 7.7 mg/dL — ABNORMAL LOW (ref 8.9–10.3)
Chloride: 102 mmol/L (ref 98–111)
Creatinine, Ser: 1.21 mg/dL — ABNORMAL HIGH (ref 0.44–1.00)
GFR, Estimated: 45 mL/min — ABNORMAL LOW (ref >60)
Glucose, Bld: 113 mg/dL — ABNORMAL HIGH (ref 70–99)
Potassium: 3 mmol/L — ABNORMAL LOW (ref 3.5–5.1)
Sodium: 133 mmol/L — ABNORMAL LOW (ref 135–145)

## 2022-07-12 LAB — MAGNESIUM: Magnesium: 1.8 mg/dL (ref 1.7–2.4)

## 2022-07-12 MED ORDER — POTASSIUM CHLORIDE CRYS ER 20 MEQ PO TBCR
40.0000 meq | EXTENDED_RELEASE_TABLET | Freq: Three times a day (TID) | ORAL | Status: AC
  Administered 2022-07-12 (×3): 40 meq via ORAL
  Filled 2022-07-12 (×3): qty 2

## 2022-07-12 MED ORDER — LINEZOLID 600 MG PO TABS
600.0000 mg | ORAL_TABLET | Freq: Two times a day (BID) | ORAL | Status: DC
  Administered 2022-07-12 – 2022-07-14 (×5): 600 mg via ORAL
  Filled 2022-07-12 (×6): qty 1

## 2022-07-12 NOTE — TOC Benefit Eligibility Note (Signed)
Patient Teacher, English as a foreign language completed.    The patient is currently admitted and upon discharge could be taking linezolid (Zyvox) 600 mg tablets.  The current 14 day co-pay is $0.00.   The patient is insured through [place], [place] Patient Advocate Specialist [facility] Patient Advocate Team Direct Number: [phone]  Fax: [phone]

## 2022-07-12 NOTE — TOC Initial Note (Addendum)
Transition of Care [facility]) - Initial/Assessment Note    Patient Details  Name: Madison [name] MRN: [id number] Date of Birth: 15-Sep-1940  [facility] [place]) CM/SW Contact:    Illene Regulus, LCSW Phone Number: 07/12/2022, 11:11 AM  Clinical Narrative:                  CSW attempted to contact [facility] no response left VM requesting return call. TOC to follow.    ADDEN 11:26am Received call from [place] with [facility], she reported pt is a LTC resident and can d/c back to facility when stable. TOC to follow.         Patient Goals and CMS Choice            Expected Discharge Plan and Services                                              Prior Living Arrangements/Services                       Activities of Daily Living Home Assistive Devices/Equipment: Dentures (specify type) ADL Screening (condition at time of admission) Patient's cognitive ability adequate to safely complete daily activities?: Yes Is the patient deaf or have difficulty hearing?: No Does the patient have difficulty seeing, even when wearing glasses/contacts?: No Does the patient have difficulty concentrating, remembering, or making decisions?: No Patient able to express need for assistance with ADLs?: Yes Does the patient have difficulty dressing or bathing?: Yes Independently performs ADLs?: No Communication: Independent Dressing (OT): Needs assistance Is this a change from baseline?: Pre-admission baseline Grooming: Needs assistance Is this a change from baseline?: Pre-admission baseline Feeding: Independent Bathing: Needs assistance Is this a change from baseline?: Pre-admission baseline Toileting: Dependent Is this a change from baseline?: Pre-admission baseline In/Out Bed: Dependent Is this a change from baseline?: Pre-admission baseline Walks in Home: Dependent Is this a change from baseline?: Pre-admission baseline Does the patient have  difficulty walking or climbing stairs?: Yes Weakness of Legs: Both Weakness of Arms/Hands: None  Permission Sought/Granted                  Emotional Assessment              Admission diagnosis:  Heart failure ([place]) [I50.9] Patient Active Problem List   Diagnosis Date Noted   Multifocal pneumonia 07/08/2022   Leukocytosis 07/08/2022   Acute on chronic diastolic CHF (congestive heart failure) ([place]) 07/08/2022   Chronic anemia 07/08/2022   Infected abrasion of right hip, initial encounter 07/03/2022   Cellulitis of right hip 07/03/2022   Atypical chest pain 07/03/2022   Chronic diastolic CHF (congestive heart failure) ([place]) 07/03/2022   Weight gain 06/27/2022   Malaise 06/27/2022   COVID-19 virus infection 05/16/2022   Ingrown left greater toenail 04/12/2022   Dysuria 04/12/2022   Absence of hip joint, 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   Gait disorder 01/27/2022   Pressure ulcer, stage 2 ([place]) 07/12/2021   Candidiasis of skin 04/13/2021   UTI (urinary tract infection) 04/02/2021   Hypokalemia 02/26/2021   Pressure ulcer of thigh, stage 2 ([place]) 02/19/2021   Elevated liver enzymes 02/16/2021   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[place]) 02/16/2021   Right hip pain 02/05/2021   Sepsis ([place]) 02/05/2021   Blood loss anemia 02/05/2021   Rheumatoid arthritis (  [place]) 02/05/2021   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   Acquired hypothyroidism 02/05/2021   Drug rash 02/05/2021   Stage 3a chronic kidney disease (CKD) ([place]) 01/30/2019   Astigmatism with presbyopia, bilateral 01/22/2019   Blepharitis of both upper and lower eyelid 01/22/2019   Dermatochalasis of both upper eyelids 01/22/2019   Dry eyes, bilateral 01/22/2019   Epiretinal membrane (ERM) of both eyes 01/22/2019   Meibomian gland dysfunction (MGD) of both eyes 01/22/2019   Pseudophakia of both eyes 01/22/2019   Vitreous syneresis of both eyes 01/22/2019   Acute cystitis without hematuria 11/01/2018   E-coli UTI  06/28/2018   Malodorous urine 06/28/2018   Class 3 obesity ([place]) 09/25/2017   Obstructive sleep apnea    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   Metabolic bone disease [id number]   Vitamin D deficiency 09/01/2015   Chronic diarrhea 07/27/2015   Edema of lower extremity 11/11/2014   Cardiac disease 03/10/2014   Heart disease 03/10/2014   Encounter for preprocedural cardiovascular examination    Essential hypertension    GERD (gastroesophageal reflux disease)    Adiposity    Supraventricular tachycardia ([place])    Decreased cardiac output    Urinary incontinence    PCP:  Oswaldo Conroy, MD Pharmacy:   [id number] [address] [place], [place] [address] [place] [address] Phone: [phone] Fax: [facility] [address] [place] [address] Phone: [phone] Fax: [phone]     Social Determinants of Health (SDOH) Social History: SDOH Screenings   Food Insecurity: No Food Insecurity (07/08/2022)  Housing: Low Risk  (07/08/2022)  Transportation Needs: No Transportation Needs (07/08/2022)  Utilities: Not At Risk (07/08/2022)  Depression (PHQ2-9): Low Risk  (05/31/2022)  Tobacco Use: Medium Risk (07/08/2022)   SDOH Interventions:     Readmission Risk Interventions     No data to display

## 2022-07-12 NOTE — Progress Notes (Signed)
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)   Acquired hypothyroidism   Absence of hip joint, right   Chronic anemia  Antibiotics:  Vancomycin 3/15 Cefepime 3/15 Doxycycline and ciprofloxacin prior to admit -current    Lines/Hardware: Bilateral knee arthroplasty  Interval Events: remains afebrile, WBC is downtrending   Assessment 25 Y O female with PMH as below including 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    # acute hypoxic respiratory failure 2/2 pul oedema, less likely pna - primary managing with IV diuretics  # Rt hip cellulitis with new bursting of purulent fluid from rt lateral thigh  CT rt hip with near complete resolution of the large fluid collection in the right hip/thigh with residual fluid and trace amount of subcutaneous gas suggesting resolving infectious/inflammatory process.   # left thigh cellulitis - improving  Xray left femur 3/18  with no acute abnormality    Recommendations Will switch doxycycline to linezolid to better cover common organisms associated with SSTIs like staph aureus, strep as well as prior proprionibacterium isolated from rt hip  Continue ciprofloxacin as is  Complete 2 weeks of abtx starting today. EOT 07/26/22 She needs good wound care as well as optimal fluid balance for her fluid overload. Defer to primary team  ID available as needed, please call with questions.   Rest of the management as per the primary team. Thank you for the consult. Please page with pertinent questions or concerns.  ______________________________________________________________________ Subjective patient seen and examined at the bedside. No complaints today. Denies pain, warmth at the left thigh, rt hip and thigh as well as bilateral knees   Vitals BP (!) 125/56 (BP Location: Right Arm)   Pulse 65   Temp (!) 97.5 F (36.4 C) (Oral)   Resp 18   Ht 5\' 8"  (1.727 m)   Wt 134.4 kg   SpO2 100%   BMI 45.05 kg/m       Physical Exam Constitutional:  Morbidly obese female lying in the bed on nasal cannula     Comments:   Cardiovascular:     Rate and Rhythm: Normal rate and regular rhythm.     Heart sounds:   Pulmonary:     Effort: Pulmonary effort is normal on [place]    Comments:   Abdominal:     Palpations: Abdomen is soft.     Tenderness: non distended and non tender   Musculoskeletal:        General: Rt hip small open wound with prior redness and swelling improved but has indurated skin. No fluctuance or crepitus.  Left thigh redness also improving, no warmth, fluctuance or crepitus                    Bilateral knee with no warmth, redness or septic joint  Skin:    Comments: No rashes   Neurological:     General: awake, alert and oriented   Psychiatric:        Mood and Affect: Mood normal.   Pertinent Microbiology Results for orders placed or performed during the hospital encounter of  07/08/22  Resp panel by RT-PCR (RSV, Flu A&B, Covid) Anterior Nasal Swab     Status: None   Collection Time: 07/08/22  5:10 PM   Specimen: Anterior Nasal Swab  Result Value Ref Range Status   SARS Coronavirus 2 by RT PCR NEGATIVE NEGATIVE Final    Comment: (NOTE) SARS-CoV-2 target nucleic acids are NOT DETECTED.  The SARS-CoV-2 RNA is generally detectable in upper respiratory specimens during the acute phase of infection. The lowest concentration of SARS-CoV-2 viral copies this assay can detect is 138 copies/mL. A negative result does not preclude SARS-Cov-2 infection and should not be used as the sole basis for treatment or other patient management decisions. A negative result may occur with  improper specimen collection/handling, submission of specimen other than nasopharyngeal swab, presence of viral mutation(s) within the areas targeted by this assay, and inadequate number of viral copies(<138 copies/mL). A negative result must be combined with clinical observations, patient history, and epidemiological information. The expected result is Negative.  Fact Sheet for Patients:  EntrepreneurPulse.com.au  Fact Sheet for Healthcare Providers:  IncredibleEmployment.be  This test is no t yet approved or cleared by the [place] FDA and  has been authorized for detection and/or diagnosis of SARS-CoV-2 by FDA under an Emergency Use Authorization (EUA). This EUA will remain  in effect (meaning this test can be used) for the duration of the COVID-19 declaration under Section 564(b)(1) of the Act, 21 U.S.C.section 360bbb-3(b)(1), unless the authorization is terminated  or revoked sooner.       Influenza A by PCR NEGATIVE NEGATIVE Final   Influenza B by PCR NEGATIVE NEGATIVE Final    Comment: (NOTE) The Xpert Xpress SARS-CoV-2/FLU/RSV plus assay is intended as an aid in the diagnosis of influenza from Nasopharyngeal swab specimens and should not  be used as a sole basis for treatment. Nasal washings and aspirates are unacceptable for Xpert Xpress SARS-CoV-2/FLU/RSV testing.  Fact Sheet for Patients: EntrepreneurPulse.com.au  Fact Sheet for Healthcare Providers: IncredibleEmployment.be  This test is not yet approved or cleared by the [place] FDA and has been authorized for detection and/or diagnosis of SARS-CoV-2 by FDA under an Emergency Use Authorization (EUA). This EUA will remain in effect (meaning this test can be used) for the duration of the COVID-19 declaration under Section 564(b)(1) of the Act, 21 U.S.C. section 360bbb-3(b)(1), unless the authorization is terminated or revoked.     Resp Syncytial Virus by PCR NEGATIVE NEGATIVE Final    Comment: (NOTE) Fact Sheet for Patients: EntrepreneurPulse.com.au  Fact Sheet for Healthcare Providers: IncredibleEmployment.be  This test is not yet approved or cleared by the [place] FDA and has been authorized for detection and/or diagnosis of SARS-CoV-2 by FDA under an Emergency Use Authorization (EUA). This EUA will remain in effect (meaning this test can be used) for the duration of the COVID-19 declaration under Section 564(b)(1) of the Act, 21 U.S.C. section 360bbb-3(b)(1), unless the authorization is terminated or revoked.  Performed at [facility]  [facility], [place] [address] [place], [place] [address]     Pertinent Lab.    Latest Ref Rng & Units 07/11/2022    4:11 AM 07/09/2022    3:53 AM 07/08/2022   10:08 PM  CBC  WBC 4.0 - 10.5 K/uL 12.8  17.2  16.7   Hemoglobin 12.0 - 15.0 g/dL 11.7  11.4  11.8   Hematocrit 36.0 - 46.0 % 39.4  38.0  39.6   Platelets 150 - 400 K/uL 601  604  590       Latest Ref Rng & Units 07/12/2022    4:37 AM 07/11/2022    4:11 AM 07/10/2022    4:24 AM  CMP  Glucose 70 - 99 mg/dL 113  111  113   BUN 8 - 23 mg/dL 20  20  21    Creatinine 0.44 -  1.00 mg/dL 1.21  1.21  1.20   Sodium 135 - 145 mmol/L 133  134  136   Potassium 3.5 - 5.1 mmol/L 3.0  3.5  3.6   Chloride 98 - 111 mmol/L 102  106  107   CO2 22 - 32 mmol/L 20  19  20    Calcium 8.9 - 10.3 mg/dL 7.7  8.0  7.9       Pertinent Imaging today Plain films and CT images have been personally visualized and interpreted; radiology reports have been reviewed. Decision making incorporated into the Impression / Recommendations.  CT HIP RIGHT W CONTRAST  Result Date: 07/11/2022 CLINICAL DATA:  Follow-up on right hip and right thigh fluid collection EXAM: CT OF THE LOWER RIGHT EXTREMITY WITH CONTRAST TECHNIQUE: Multidetector CT imaging of the lower right extremity was performed according to the standard protocol following intravenous contrast administration. RADIATION DOSE REDUCTION: This exam was performed according to the departmental dose-optimization program which includes automated exposure control, adjustment of the mA and/or kV according to patient size and/or use of iterative reconstruction technique. CONTRAST:  169mL OMNIPAQUE IOHEXOL 300 MG/ML  SOLN COMPARISON:  CT examination dated July 02, 2022. FINDINGS: Bones/Joint/Cartilage Redemonstration of the hardware removal from prior right hip arthroplasty. Osseous bridging of the acetabular roof. Granulation tissues at the expected location of the femoral prosthetic head. No acute osseous abnormality or significant interval change since recent examination of March 9. Ligaments Suboptimally assessed by CT. Muscles and Tendons Generalized muscle atrophy of the right hip muscles. No intramuscular fluid collection or hematoma. Soft tissues Interval near complete resolution of the large fluid collection in the right hip/thigh with residual fluid and trace amount of subcutaneous gas suggesting resolving infectious/inflammatory process IMPRESSION: 1. Interval near complete resolution of the large fluid collection in the right hip/thigh with residual  fluid and trace amount of subcutaneous gas suggesting resolving infectious/inflammatory process. 2. Redemonstration of the hardware removal from prior right hip arthroplasty. Osseous bridging of the acetabular roof. Granulation tissues at the expected location of the femoral prosthetic head. No acute osseous abnormality or significant interval change since recent examination. Electronically Signed   By: Keane Police D.O.   On: 07/11/2022 21:40   DG ESOPHAGUS W SINGLE CM (SOL OR THIN BA)  Result Date: 07/11/2022 CLINICAL DATA:  82 year old female with complaint of difficulty swallowing solids and pills. EXAM: ESOPHAGUS/BARIUM SWALLOW/TABLET STUDY TECHNIQUE: Single contrast examination was performed using thin liquid barium. This exam was performed by Brynda Greathouse PA-C, and was supervised and interpreted by Richardean Sale, MD. FLUOROSCOPY: Radiation Exposure Index (as provided by the fluoroscopic device): 17.9 mGy [facility]  COMPARISON:  CT ANGIO CHEST 07/02/2022 FINDINGS: Swallowing: Appears normal. No vestibular penetration or aspiration seen. Pharynx: Unremarkable. Esophagus: Tortuous distal esophagus without evidence of high-grade stricture, mass or mucosal ulceration. Esophageal motility: Tertiary contractions noted with retrograde flow and poor progression of barium bolus. Hiatal Hernia: Moderate sized hiatal hernia Gastroesophageal reflux: None visualized. Ingested 35mm barium tablet: Not given; patient refused. Other: Limited exam due to infected right hip, immobility, clinical status. IMPRESSION: 1. Tortuous distal esophagus without evidence of focal mucosal abnormality. 2.  Moderate sized hiatal hernia. 3. Moderate esophageal dysmotility with tertiary contractions and retrograde flow of barium bolus to the upper esophagus. 4.  No limitations above. Electronically Signed   By: Richardean Sale M.D.   On: 07/11/2022 16:23   DG FEMUR MIN 2 VIEWS LEFT  Result Date: 07/11/2022 CLINICAL DATA:  Left hip pain and  cellulitis. EXAM: LEFT FEMUR 2 VIEWS COMPARISON:  Frontal view of the bilateral hips and right hip radiographs 07/02/2022, AP pelvis 12/03/2020 FINDINGS: Moderately decreased bone mineralization. Tiny knee joint effusion. Status post total left knee arthroplasty. No perihardware lucency is seen to indicate hardware failure or loosening on the provided images. The tibial prosthetic component is only incompletely imaged on lateral view. Minimal superomedial left femoroacetabular joint space narrowing. Mild superolateral left acetabular degenerative osteophytosis. No acute fracture is seen. No dislocation. IMPRESSION: 1. Status post total left knee arthroplasty without evidence of hardware failure. 2. Minimal left femoroacetabular osteoarthritis. Electronically Signed   By: Yvonne Kendall M.D.   On: 07/11/2022 16:11    I spent 55 minutes for this patient encounter including review of prior medical records, coordination of care with primary/other specialist with greater than 50% of time being face to face/counseling and discussing diagnostics/treatment plan with the patient/family.  Electronically signed by:   Rosiland Oz, MD Infectious Disease Physician [facility] for Infectious Disease Pager: [phone]

## 2022-07-12 NOTE — Progress Notes (Signed)
[facility] PROGRESS NOTE   Madison [name] [id number] DOB: Apr 10, 1941 DOA: 07/08/2022  PCP: Oswaldo Conroy, MD  Brief History/Interval Summary: 82 y.o. 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 Patient had a recent hospitalization, from 07/02/22 to 07/07/22 for right hip cellulitis complicated with sepsis. Patient was treated with IV antibiotic therapy. ID and orthopedics were consulted. Plan to continue oral antibiotic therapy with ciprofloxacin and doxycycline until 08/01/2022.  Patient was at skilled nursing facility.  Has been having increased work of breathing for few days.  Concern was for pulmonary edema.  Patient was hospitalized for further management.  Consultants: ID  Procedures: Echocardiogram    Subjective/Interval History: Patient mentions that her shortness of breath has improved.  She was told about her CT scan findings and that the infection seems to be improving.  Husband at bedside.      Assessment/Plan:  Acute on chronic diastolic CHF Echocardiogram was done and shows normal systolic function.  It was however a poor quality study. Patient appears to have diuresed with furosemide though weight has not changed much.  Chest x-ray does show some improvement but did also show persistent pulmonary edema. Dose of furosemide was increased yesterday to 80 mg twice a day.  Continue this dose for now.  Monitor ins and outs and daily weights.  Monitor electrolytes and renal function closely.  Acute respiratory failure with hypoxia Secondary to CHF.  Wean down oxygen to maintain saturations greater than 90%.    Cellulitis of the right hip Recent admission for same.  Currently on ciprofloxacin and doxycycline.  Was seen by ID during previous admission.  Was also seen by orthopedics during previous  admission due to concern for abscess but orthopedics did not feel that the fluid collection noted on CT scan was an abscess.  Thought to be a collection of blood or seroma.  Did not require any intervention. On 3/17 patient noted to have excessive drainage around the right hip area.  Apparently there is an area that opened up.  It looks like a fluid collection that was noted on imaging studies has now extruded through an opening.   Patient and husband concerned that the infection is not improving.  Infectious disease was reconsulted.  A CT hip was repeated which actually shows significant improvement compared to previous CT scan.  Most of the fluid has dissipated.  Do not see an absolute indication to consult to orthopedics based on the new CT findings.  Will defer to infectious disease. Patient remains on ciprofloxacin and doxycycline.  WBC had improved yesterday.  Will recheck tomorrow. Erythema noted also noted over the left lateral thigh.  X-ray of the left hip joint does not show any acute findings.  Dysphagia Likely esophageal based on barium swallow study.  Dysmotility is noted.  No stricture identified.  Patient given the results of the study.  Patient also seen by speech therapy.  Essential hypertension Blood pressure is noted to be stable.  Continue to monitor.  Chronic kidney disease stage IIIa Monitor renal function closely while patient is being diuresed.  Monitor urine output.  Hypokalemia Continue to supplement potassium.  Will check magnesium level.  Normocytic anemia Likely anemia of chronic disease.  Stable for the most part.  Acquired hypothyroidism Continue levothyroxine.  GERD Continue with antacids.  Class III obesity Estimated body mass index is 45.05  kg/m as calculated from the following:   Height as of this encounter: 5\' 8"  (1.727 m).   Weight as of this encounter: 134.4 kg.   DVT Prophylaxis: Lovenox Code Status: Full code Family Communication: Discussed  with patient and her husband. Disposition Plan: Return to SNF when medically stable  Status is: Inpatient Remains inpatient appropriate because: Pulmonary edema      Medications: Scheduled:  cholecalciferol  4,000 Units Oral Daily   ciprofloxacin  750 mg Oral BID   doxycycline  100 mg Oral BID   enoxaparin (LOVENOX) injection  60 mg Subcutaneous Q24H   ferrous sulfate  325 mg Oral Q breakfast   furosemide  80 mg Intravenous Q12H   levothyroxine  50 mcg Oral QODAY   mirtazapine  7.5 mg Oral QHS   pantoprazole  40 mg Oral Daily   spironolactone  25 mg Oral Daily   Continuous: [id number] **OR** acetaminophen, albuterol, ondansetron **OR** ondansetron (ZOFRAN) IV  Antibiotics: Anti-infectives (From admission, onward)    Start     Dose/Rate Route Frequency Ordered Stop   07/08/22 2245  ciprofloxacin (CIPRO) tablet 750 mg        750 mg Oral 2 times daily 07/08/22 2131 08/01/22 2359   07/08/22 2245  doxycycline (VIBRA-TABS) tablet 100 mg        100 mg Oral 2 times daily 07/08/22 2131 08/01/22 2359   07/08/22 1915  vancomycin (VANCOREADY) IVPB 2000 mg/400 mL        2,000 mg 200 mL/hr over 120 Minutes Intravenous  Once 07/08/22 1908 07/08/22 2229   07/08/22 1915  ceFEPIme (MAXIPIME) 2 g in sodium chloride 0.9 % 100 mL IVPB        2 g 200 mL/hr over 30 Minutes Intravenous  Once 07/08/22 1908 07/08/22 2013       Objective:  Vital Signs  Vitals:   07/11/22 0633 07/11/22 1353 07/11/22 2147 07/12/22 0457  BP: (!) 151/75 (!) 104/51 128/75 (!) 141/56  Pulse: 75 (!) 106 80 71  Resp: 20 18 16 18   Temp: 97.9 F (36.6 C) 97.6 F (36.4 C) 97.6 F (36.4 C) 97.8 F (36.6 C)  TempSrc: Oral Oral Oral Oral  SpO2: 95% 99% 96% 96%  Weight:      Height:        Intake/Output Summary (Last 24 hours) at 07/12/2022 0935 Last data filed at 07/12/2022 0515 Gross per 24 hour  Intake 480 ml  Output 750 ml  Net -270 ml    Filed Weights   07/08/22 1946 07/10/22 0921 07/11/22  0500  Weight: 136.1 kg 134.6 kg 134.4 kg    General appearance: Awake alert.  In no distress Resp: Clear to auscultation bilaterally.  Normal effort Cardio: S1-S2 is normal regular.  No S3-S4.  No rubs murmurs or bruit GI: Abdomen is soft.  Nontender nondistended.  Bowel sounds are present normal.  No masses organomegaly Extremities: Minimal drainage from the open area on the right hip.  Erythema persists.  Erythema also noted left lateral thigh.     Lab Results:  Data Reviewed: I have personally reviewed following labs and reports of the imaging studies  CBC: Recent Labs  Lab 07/06/22 0702 07/08/22 1714 07/08/22 2208 07/09/22 0353 07/11/22 0411  WBC 11.9* 15.6* 16.7* 17.2* 12.8*  NEUTROABS 9.5* 12.1*  --   --   --   HGB 10.8* 11.7* 11.8* 11.4* 11.7*  HCT 37.0 38.5 39.6 38.0 39.4  MCV 91.8 90.2 91.5 90.9 89.1  PLT 536*  607* 590* 604* 601*     Basic Metabolic Panel: Recent Labs  Lab 07/06/22 0702 07/08/22 1714 07/08/22 2208 07/09/22 0353 07/10/22 0424 07/11/22 0411 07/12/22 0437  NA 136 133*  --  136 136 134* 133*  K 3.4* 3.9  --  3.4* 3.6 3.5 3.0*  CL 109 110  --  109 107 106 102  CO2 19* 17*  --  17* 20* 19* 20*  GLUCOSE 106* 112*  --  104* 113* 111* 113*  BUN 20 17  --  20 21 20 20   CREATININE 1.01* 1.18* 0.98 1.11* 1.20* 1.21* 1.21*  CALCIUM 8.4* 8.2*  --  8.0* 7.9* 8.0* 7.7*  MG 2.4  --   --   --   --   --   --   PHOS 3.6  --   --   --   --   --   --      GFR: Estimated Creatinine Clearance: 53 mL/min (A) (by C-G formula based on SCr of 1.21 mg/dL (H)).  Liver Function Tests: Recent Labs  Lab 07/06/22 0702  AST 36  ALT 25  ALKPHOS 63  BILITOT 0.3  PROT 6.4*  ALBUMIN 2.3*       Cardiac Enzymes: Recent Labs  Lab 07/06/22 0702  CKTOTAL 12*      CBG: No results for input(s): "GLUCAP" in the last 168 hours.   Anemia Panel: No results for input(s): "VITAMINB12", "FOLATE", "FERRITIN", "TIBC", "IRON", "RETICCTPCT" in the last 72  hours.   Recent Results (from the past 240 hour(s))  Resp panel by RT-PCR (RSV, Flu A&B, Covid) Anterior Nasal Swab     Status: None   Collection Time: 07/02/22  6:35 PM   Specimen: Anterior Nasal Swab  Result Value Ref Range Status   SARS Coronavirus 2 by RT PCR NEGATIVE NEGATIVE Final    Comment: (NOTE) SARS-CoV-2 target nucleic acids are NOT DETECTED.  The SARS-CoV-2 RNA is generally detectable in upper respiratory specimens during the acute phase of infection. The lowest concentration of SARS-CoV-2 viral copies this assay can detect is 138 copies/mL. A negative result does not preclude SARS-Cov-2 infection and should not be used as the sole basis for treatment or other patient management decisions. A negative result may occur with  improper specimen collection/handling, submission of specimen other than nasopharyngeal swab, presence of viral mutation(s) within the areas targeted by this assay, and inadequate number of viral copies(<138 copies/mL). A negative result must be combined with clinical observations, patient history, and epidemiological information. The expected result is Negative.  Fact Sheet for Patients:  EntrepreneurPulse.com.au  Fact Sheet for Healthcare Providers:  IncredibleEmployment.be  This test is no t yet approved or cleared by the [place] FDA and  has been authorized for detection and/or diagnosis of SARS-CoV-2 by FDA under an Emergency Use Authorization (EUA). This EUA will remain  in effect (meaning this test can be used) for the duration of the COVID-19 declaration under Section 564(b)(1) of the Act, 21 U.S.C.section 360bbb-3(b)(1), unless the authorization is terminated  or revoked sooner.       Influenza A by PCR NEGATIVE NEGATIVE Final   Influenza B by PCR NEGATIVE NEGATIVE Final    Comment: (NOTE) The Xpert Xpress SARS-CoV-2/FLU/RSV plus assay is intended as an aid in the diagnosis of influenza from  Nasopharyngeal swab specimens and should not be used as a sole basis for treatment. Nasal washings and aspirates are unacceptable for Xpert Xpress SARS-CoV-2/FLU/RSV testing.  Fact Sheet for Patients:  EntrepreneurPulse.com.au  Fact Sheet for Healthcare Providers: IncredibleEmployment.be  This test is not yet approved or cleared by the [place] FDA and has been authorized for detection and/or diagnosis of SARS-CoV-2 by FDA under an Emergency Use Authorization (EUA). This EUA will remain in effect (meaning this test can be used) for the duration of the COVID-19 declaration under Section 564(b)(1) of the Act, 21 U.S.C. section 360bbb-3(b)(1), unless the authorization is terminated or revoked.     Resp Syncytial Virus by PCR NEGATIVE NEGATIVE Final    Comment: (NOTE) Fact Sheet for Patients: EntrepreneurPulse.com.au  Fact Sheet for Healthcare Providers: IncredibleEmployment.be  This test is not yet approved or cleared by the [place] FDA and has been authorized for detection and/or diagnosis of SARS-CoV-2 by FDA under an Emergency Use Authorization (EUA). This EUA will remain in effect (meaning this test can be used) for the duration of the COVID-19 declaration under Section 564(b)(1) of the Act, 21 U.S.C. section 360bbb-3(b)(1), unless the authorization is terminated or revoked.  Performed at [facility], [place] [address], [place], [place] [address]   Culture, blood (routine x 2)     Status: None   Collection Time: 07/02/22 10:00 PM   Specimen: BLOOD  Result Value Ref Range Status   Specimen Description   Final    BLOOD SITE NOT SPECIFIED Performed at [place] [address] [place], [place] [address]    Special Requests   Final    BOTTLES DRAWN AEROBIC AND ANAEROBIC Blood Culture results may not be optimal due to an inadequate volume of blood received in culture  bottles Performed at [facility] [address], [place], [place] [address]    Culture   Final    NO GROWTH 5 DAYS Performed at [facility], [place] [address], [place], [place] [address]    Report Status 07/08/2022 FINAL  Final  Culture, blood (routine x 2)     Status: None   Collection Time: 07/02/22 10:15 PM   Specimen: BLOOD  Result Value Ref Range Status   Specimen Description   Final    BLOOD BLOOD RIGHT HAND Performed at [facility] [address], [place], [place] [address]    Special Requests   Final    BOTTLES DRAWN AEROBIC AND ANAEROBIC Blood Culture results may not be optimal due to an excessive volume of blood received in culture bottles Performed at [facility] [address], [place], [place] [address]    Culture   Final    NO GROWTH 5 DAYS Performed at [facility], [place] [address] [place], [place] [address]    Report Status 07/08/2022 FINAL  Final  MRSA Next Gen by PCR, Nasal     Status: None   Collection Time: 07/03/22  5:10 PM   Specimen: Nasal Mucosa; Nasal Swab  Result Value Ref Range Status   MRSA by PCR Next Gen NOT DETECTED NOT DETECTED Final    Comment: (NOTE) The GeneXpert MRSA Assay (FDA approved for NASAL specimens only), is one component of a comprehensive MRSA colonization surveillance program. It is not intended to diagnose MRSA infection nor to guide or monitor treatment for MRSA infections. Test performance is not FDA approved in patients less than 4 years old. Performed at [facility], [place] [address] [place], [place] [address]   Resp panel by RT-PCR (RSV, Flu A&B, Covid) Anterior Nasal Swab     Status: None  Collection Time: 07/08/22  5:10 PM   Specimen: Anterior Nasal Swab  Result Value Ref Range Status   SARS Coronavirus 2 by RT PCR NEGATIVE NEGATIVE Final    Comment: (NOTE) SARS-CoV-2 target nucleic acids are NOT DETECTED.  The SARS-CoV-2  RNA is generally detectable in upper respiratory specimens during the acute phase of infection. The lowest concentration of SARS-CoV-2 viral copies this assay can detect is 138 copies/mL. A negative result does not preclude SARS-Cov-2 infection and should not be used as the sole basis for treatment or other patient management decisions. A negative result may occur with  improper specimen collection/handling, submission of specimen other than nasopharyngeal swab, presence of viral mutation(s) within the areas targeted by this assay, and inadequate number of viral copies(<138 copies/mL). A negative result must be combined with clinical observations, patient history, and epidemiological information. The expected result is Negative.  Fact Sheet for Patients:  EntrepreneurPulse.com.au  Fact Sheet for Healthcare Providers:  IncredibleEmployment.be  This test is no t yet approved or cleared by the [place] FDA and  has been authorized for detection and/or diagnosis of SARS-CoV-2 by FDA under an Emergency Use Authorization (EUA). This EUA will remain  in effect (meaning this test can be used) for the duration of the COVID-19 declaration under Section 564(b)(1) of the Act, 21 U.S.C.section 360bbb-3(b)(1), unless the authorization is terminated  or revoked sooner.       Influenza A by PCR NEGATIVE NEGATIVE Final   Influenza B by PCR NEGATIVE NEGATIVE Final    Comment: (NOTE) The Xpert Xpress SARS-CoV-2/FLU/RSV plus assay is intended as an aid in the diagnosis of influenza from Nasopharyngeal swab specimens and should not be used as a sole basis for treatment. Nasal washings and aspirates are unacceptable for Xpert Xpress SARS-CoV-2/FLU/RSV testing.  Fact Sheet for Patients: EntrepreneurPulse.com.au  Fact Sheet for Healthcare Providers: IncredibleEmployment.be  This test is not yet approved or cleared by the  [place] FDA and has been authorized for detection and/or diagnosis of SARS-CoV-2 by FDA under an Emergency Use Authorization (EUA). This EUA will remain in effect (meaning this test can be used) for the duration of the COVID-19 declaration under Section 564(b)(1) of the Act, 21 U.S.C. section 360bbb-3(b)(1), unless the authorization is terminated or revoked.     Resp Syncytial Virus by PCR NEGATIVE NEGATIVE Final    Comment: (NOTE) Fact Sheet for Patients: EntrepreneurPulse.com.au  Fact Sheet for Healthcare Providers: IncredibleEmployment.be  This test is not yet approved or cleared by the [place] FDA and has been authorized for detection and/or diagnosis of SARS-CoV-2 by FDA under an Emergency Use Authorization (EUA). This EUA will remain in effect (meaning this test can be used) for the duration of the COVID-19 declaration under Section 564(b)(1) of the Act, 21 U.S.C. section 360bbb-3(b)(1), unless the authorization is terminated or revoked.  Performed at [facility], [place] [address] [place], [place] [address]       Radiology Studies: CT HIP RIGHT W CONTRAST  Result Date: 07/11/2022 CLINICAL DATA:  Follow-up on right hip and right thigh fluid collection EXAM: CT OF THE LOWER RIGHT EXTREMITY WITH CONTRAST TECHNIQUE: Multidetector CT imaging of the lower right extremity was performed according to the standard protocol following intravenous contrast administration. RADIATION DOSE REDUCTION: This exam was performed according to the departmental dose-optimization program which includes automated exposure control, adjustment of the mA and/or kV according to patient size and/or use of iterative reconstruction technique. CONTRAST:  145mL OMNIPAQUE IOHEXOL 300 MG/ML  SOLN COMPARISON:  CT examination dated July 02, 2022. FINDINGS: Bones/Joint/Cartilage Redemonstration of the hardware removal from prior right hip  arthroplasty. Osseous bridging of the acetabular roof. Granulation tissues at the expected location of the femoral prosthetic head. No acute osseous abnormality or significant interval change since recent examination of March 9. Ligaments Suboptimally assessed by CT. Muscles and Tendons Generalized muscle atrophy of the right hip muscles. No intramuscular fluid collection or hematoma. Soft tissues Interval near complete resolution of the large fluid collection in the right hip/thigh with residual fluid and trace amount of subcutaneous gas suggesting resolving infectious/inflammatory process IMPRESSION: 1. Interval near complete resolution of the large fluid collection in the right hip/thigh with residual fluid and trace amount of subcutaneous gas suggesting resolving infectious/inflammatory process. 2. Redemonstration of the hardware removal from prior right hip arthroplasty. Osseous bridging of the acetabular roof. Granulation tissues at the expected location of the femoral prosthetic head. No acute osseous abnormality or significant interval change since recent examination. Electronically Signed   By: Keane Police D.O.   On: 07/11/2022 21:40   DG ESOPHAGUS W SINGLE CM (SOL OR THIN BA)  Result Date: 07/11/2022 CLINICAL DATA:  82 year old female with complaint of difficulty swallowing solids and pills. EXAM: ESOPHAGUS/BARIUM SWALLOW/TABLET STUDY TECHNIQUE: Single contrast examination was performed using thin liquid barium. This exam was performed by Brynda Greathouse PA-C, and was supervised and interpreted by Richardean Sale, MD. FLUOROSCOPY: Radiation Exposure Index (as provided by the fluoroscopic device): 17.9 mGy Kerma COMPARISON:  CT ANGIO CHEST 07/02/2022 FINDINGS: Swallowing: Appears normal. No vestibular penetration or aspiration seen. Pharynx: Unremarkable. Esophagus: Tortuous distal esophagus without evidence of high-grade stricture, mass or mucosal ulceration. Esophageal motility: Tertiary contractions  noted with retrograde flow and poor progression of barium bolus. Hiatal Hernia: Moderate sized hiatal hernia Gastroesophageal reflux: None visualized. Ingested 6mm barium tablet: Not given; patient refused. Other: Limited exam due to infected right hip, immobility, clinical status. IMPRESSION: 1. Tortuous distal esophagus without evidence of focal mucosal abnormality. 2.  Moderate sized hiatal hernia. 3. Moderate esophageal dysmotility with tertiary contractions and retrograde flow of barium bolus to the upper esophagus. 4.  No limitations above. Electronically Signed   By: Richardean Sale M.D.   On: 07/11/2022 16:23   DG FEMUR MIN 2 VIEWS LEFT  Result Date: 07/11/2022 CLINICAL DATA:  Left hip pain and cellulitis. EXAM: LEFT FEMUR 2 VIEWS COMPARISON:  Frontal view of the bilateral hips and right hip radiographs 07/02/2022, AP pelvis 12/03/2020 FINDINGS: Moderately decreased bone mineralization. Tiny knee joint effusion. Status post total left knee arthroplasty. No perihardware lucency is seen to indicate hardware failure or loosening on the provided images. The tibial prosthetic component is only incompletely imaged on lateral view. Minimal superomedial left femoroacetabular joint space narrowing. Mild superolateral left acetabular degenerative osteophytosis. No acute fracture is seen. No dislocation. IMPRESSION: 1. Status post total left knee arthroplasty without evidence of hardware failure. 2. Minimal left femoroacetabular osteoarthritis. Electronically Signed   By: Yvonne Kendall M.D.   On: 07/11/2022 16:11   DG CHEST PORT 1 VIEW  Result Date: 07/11/2022 CLINICAL DATA:  Pulmonary edema. EXAM: PORTABLE CHEST 1 VIEW COMPARISON:  07/08/2022 FINDINGS: Prominent lung markings particularly in the left upper chest. Heart size is upper limits of normal but stable. Trachea is midline. Atherosclerotic calcifications at the aortic arch. Slightly improved aeration at the left lung base. Negative for a pneumothorax.  No acute bone abnormality. IMPRESSION: 1. Slightly improved aeration at the left lung base. 2.  Again noted are prominent lung markings particularly in left upper lung. Findings could be associated with asymmetric interstitial edema. Electronically Signed   By: Markus Daft M.D.   On: 07/11/2022 08:30       LOS: 4 days   [facility] Pager on [email or web address]  07/12/2022, 9:35 AM

## 2022-07-13 DIAGNOSIS — I5033 Acute on chronic diastolic (congestive) heart failure: Secondary | ICD-10-CM | POA: Diagnosis not present

## 2022-07-13 LAB — CBC
HCT: 39.9 % (ref 36.0–46.0)
Hemoglobin: 11.9 g/dL — ABNORMAL LOW (ref 12.0–15.0)
MCH: 26.7 pg (ref 26.0–34.0)
MCHC: 29.8 g/dL — ABNORMAL LOW (ref 30.0–36.0)
MCV: 89.7 fL (ref 80.0–100.0)
Platelets: 466 10*3/uL — ABNORMAL HIGH (ref 150–400)
RBC: 4.45 MIL/uL (ref 3.87–5.11)
RDW: 14.5 % (ref 11.5–15.5)
WBC: 9.5 10*3/uL (ref 4.0–10.5)
nRBC: 0 % (ref 0.0–0.2)

## 2022-07-13 LAB — BASIC METABOLIC PANEL
Anion gap: 9 (ref 5–15)
BUN: 22 mg/dL (ref 8–23)
CO2: 20 mmol/L — ABNORMAL LOW (ref 22–32)
Calcium: 8.2 mg/dL — ABNORMAL LOW (ref 8.9–10.3)
Chloride: 101 mmol/L (ref 98–111)
Creatinine, Ser: 1.28 mg/dL — ABNORMAL HIGH (ref 0.44–1.00)
GFR, Estimated: 42 mL/min — ABNORMAL LOW (ref >60)
Glucose, Bld: 103 mg/dL — ABNORMAL HIGH (ref 70–99)
Potassium: 3.5 mmol/L (ref 3.5–5.1)
Sodium: 130 mmol/L — ABNORMAL LOW (ref 135–145)

## 2022-07-13 MED ORDER — POTASSIUM CHLORIDE 20 MEQ PO PACK
40.0000 meq | PACK | Freq: Once | ORAL | Status: AC
  Administered 2022-07-13: 40 meq via ORAL
  Filled 2022-07-13: qty 2

## 2022-07-13 MED ORDER — POTASSIUM CHLORIDE CRYS ER 20 MEQ PO TBCR: 40.0000 meq | EXTENDED_RELEASE_TABLET | Freq: Once | ORAL | Status: DC

## 2022-07-13 NOTE — Plan of Care (Signed)
  Problem: Education: Goal: Knowledge of General Education information will improve Description Including pain rating scale, medication(s)/side effects and non-pharmacologic comfort measures Outcome: Progressing   Problem: Health Behavior/Discharge Planning: Goal: Ability to manage health-related needs will improve Outcome: Progressing   

## 2022-07-13 NOTE — Progress Notes (Signed)
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  Patient has had a recent hospitalization from 07/02/2022 until 07/07/2022 for right hip cellulitis complicated with sepsis.  Patient was treated with IV antibiotics.  ID and orthopedic were consulted.  Plan was for oral antibiotics therapy with ciprofloxacin and doxycycline on 2/80/2024.  Patient was at a skilled nursing facility.  She had increased work of breathing for a few days, concern for pulmonary edema.  Patient admitted for acute on chronic heart failure exacerbation.   Assessment & Plan:   Principal Problem:   Acute on chronic diastolic CHF (congestive heart failure) (HCC) Active Problems:   Essential hypertension   Stage 3a chronic kidney disease (CKD) (HCC)   GERD (gastroesophageal reflux disease)   Acquired hypothyroidism   Absence of hip joint, right   Class 3 obesity (HCC)   Chronic anemia  1-Acute on chronic diastolic heart failure exacerbation: -Acute hypoxic respiratory failure secondary to HF -Echo showing normal systolic function. -Patient presented with shortness of breath, chest x-ray showed pulmonary edema. -Continue with Lasix 80 mg IV twice daily. -She is -4.4 L -Weight:300-----296  2- Cellulitis of the right hip Recent admission for the same. She was prior to admission on ciprofloxacin and doxycycline She was evaluated by her admission by Ortho and ID.  Due to concern for abscess but orthopedic thought that fluid collection seen on CT was blood or seroma. -On 3/17 she was noted to have excessive drainage around the right hip area.  It looks like fluid collection that was noted on imaging yesterday has now  extruded through an opening. -Infectious diseases reconsulted.  CT hip repeated which shows significant improvement compared to prior CT scan.  Most of the fluid has dissipated. -Recommend to continue with ciprofloxacin and changed Doxy to linezolid.  Need 2 more weeks of oral antibiotics, EOT 07/26/2022   3-Left thigh cellulitis: Improving x-ray left femoral no acute abnormality.  4-Dysphagia: Likely esophageal based on barium swallow study.  Dysmotility is noted.  Not extra to identified. On Dysphagia 3 diet.   Hypertension: On spironolactone.   CKD stage IIIa: Cr stable. Monitor. 1.2  Hypokalemia: Replaced.   Normocytic  anemia: Hb stable. Monitor.   Hypothyroidism: Continue with Synthroid GERD: Continue with antiacid Hyponatremia; monitor on lasix.  Class III obesity: Lifestyle modification    Estimated body mass index is 45.05 kg/m as calculated from the following:   Height as of this encounter: 5\' 8"  (1.727 m).   Weight as of this encounter: 134.4 kg.   DVT prophylaxis: Lovenox Code Status: Full code Family Communication: will contact husband Disposition Plan:  Status is: Inpatient Remains inpatient appropriate because: management of HF exacerbation    Consultants:  ID  Procedures:    Antimicrobials:    Subjective: She is feeling better, breathing better   Objective: Vitals:   07/13/22 0330 07/13/22 0448 07/13/22 0530 07/13/22 1246  BP:  122/74  (!) 121/93  Pulse:  71  69  Resp:  16  18  Temp:    97.7 F (36.5 C)  TempSrc:    Oral  SpO2: 97% 93% 95% 95%  Weight:      Height:        Intake/Output Summary (Last 24  hours) at 07/13/2022 1349 Last data filed at 07/13/2022 1235 Gross per 24 hour  Intake 600 ml  Output 2400 ml  Net -1800 ml   Filed Weights   07/08/22 1946 07/10/22 0921 07/11/22 0500  Weight: 136.1 kg 134.6 kg 134.4 kg    Examination:  General exam: Appears calm and comfortable  Respiratory system: BL crackles.   Cardiovascular system: S1 & S2 heard, RRR.  Gastrointestinal system: Abdomen is nondistended, soft and nontender. No organomegaly or masses felt. Normal bowel sounds heard. Central nervous system: Alert and oriented. No focal neurological deficits. Extremities: right hip with dressing.  Data Reviewed: I have personally reviewed following labs and imaging studies  CBC: Recent Labs  Lab 07/08/22 1714 07/08/22 2208 07/09/22 0353 07/11/22 0411 07/13/22 0412  WBC 15.6* 16.7* 17.2* 12.8* 9.5  NEUTROABS 12.1*  --   --   --   --   HGB 11.7* 11.8* 11.4* 11.7* 11.9*  HCT 38.5 39.6 38.0 39.4 39.9  MCV 90.2 91.5 90.9 89.1 89.7  PLT 607* 590* 604* 601* 123XX123*   Basic Metabolic Panel: Recent Labs  Lab 07/09/22 0353 07/10/22 0424 07/11/22 0411 07/12/22 0437 07/13/22 0412  NA 136 136 134* 133* 130*  K 3.4* 3.6 3.5 3.0* 3.5  CL 109 107 106 102 101  CO2 17* 20* 19* 20* 20*  GLUCOSE 104* 113* 111* 113* 103*  BUN 20 21 20 20 22   CREATININE 1.11* 1.20* 1.21* 1.21* 1.28*  CALCIUM 8.0* 7.9* 8.0* 7.7* 8.2*  MG  --   --   --  1.8  --    GFR: Estimated Creatinine Clearance: 50.1 mL/min (A) (by C-G formula based on SCr of 1.28 mg/dL (H)). Liver Function Tests: No results for input(s): "AST", "ALT", "ALKPHOS", "BILITOT", "PROT", "ALBUMIN" in the last 168 hours. No results for input(s): "LIPASE", "AMYLASE" in the last 168 hours. No results for input(s): "AMMONIA" in the last 168 hours. Coagulation Profile: No results for input(s): "INR", "PROTIME" in the last 168 hours. Cardiac Enzymes: No results for input(s): "CKTOTAL", "CKMB", "CKMBINDEX", "TROPONINI" in the last 168 hours. BNP (last 3 results) No results for input(s): "PROBNP" in the last 8760 hours. HbA1C: No results for input(s): "HGBA1C" in the last 72 hours. CBG: No results for input(s): "GLUCAP" in the last 168 hours. Lipid Profile: No results for input(s): "CHOL", "HDL", "LDLCALC", "TRIG", "CHOLHDL", "LDLDIRECT" in the last 72  hours. Thyroid Function Tests: No results for input(s): "TSH", "T4TOTAL", "FREET4", "T3FREE", "THYROIDAB" in the last 72 hours. Anemia Panel: No results for input(s): "VITAMINB12", "FOLATE", "FERRITIN", "TIBC", "IRON", "RETICCTPCT" in the last 72 hours. Sepsis Labs: No results for input(s): "PROCALCITON", "LATICACIDVEN" in the last 168 hours.  Recent Results (from the past 240 hour(s))  MRSA Next Gen by PCR, Nasal     Status: None   Collection Time: 07/03/22  5:10 PM   Specimen: Nasal Mucosa; Nasal Swab  Result Value Ref Range Status   MRSA by PCR Next Gen NOT DETECTED NOT DETECTED Final    Comment: (NOTE) The GeneXpert MRSA Assay (FDA approved for NASAL specimens only), is one component of a comprehensive MRSA colonization surveillance program. It is not intended to diagnose MRSA infection nor to guide or monitor treatment for MRSA infections. Test performance is not FDA approved in patients less than 62 years old. Performed at [facility], [place] [address] [place], [place] [address]   Resp panel by RT-PCR (RSV, Flu A&B, Covid) Anterior Nasal Swab     Status: None  Collection Time: 07/08/22  5:10 PM   Specimen: Anterior Nasal Swab  Result Value Ref Range Status   SARS Coronavirus 2 by RT PCR NEGATIVE NEGATIVE Final    Comment: (NOTE) SARS-CoV-2 target nucleic acids are NOT DETECTED.  The SARS-CoV-2 RNA is generally detectable in upper respiratory specimens during the acute phase of infection. The lowest concentration of SARS-CoV-2 viral copies this assay can detect is 138 copies/mL. A negative result does not preclude SARS-Cov-2 infection and should not be used as the sole basis for treatment or other patient management decisions. A negative result may occur with  improper specimen collection/handling, submission of specimen other than nasopharyngeal swab, presence of viral mutation(s) within the areas targeted by this assay, and inadequate number of  viral copies(<138 copies/mL). A negative result must be combined with clinical observations, patient history, and epidemiological information. The expected result is Negative.  Fact Sheet for Patients:  EntrepreneurPulse.com.au  Fact Sheet for Healthcare Providers:  IncredibleEmployment.be  This test is no t yet approved or cleared by the [place] FDA and  has been authorized for detection and/or diagnosis of SARS-CoV-2 by FDA under an Emergency Use Authorization (EUA). This EUA will remain  in effect (meaning this test can be used) for the duration of the COVID-19 declaration under Section 564(b)(1) of the Act, 21 U.S.C.section 360bbb-3(b)(1), unless the authorization is terminated  or revoked sooner.       Influenza A by PCR NEGATIVE NEGATIVE Final   Influenza B by PCR NEGATIVE NEGATIVE Final    Comment: (NOTE) The Xpert Xpress SARS-CoV-2/FLU/RSV plus assay is intended as an aid in the diagnosis of influenza from Nasopharyngeal swab specimens and should not be used as a sole basis for treatment. Nasal washings and aspirates are unacceptable for Xpert Xpress SARS-CoV-2/FLU/RSV testing.  Fact Sheet for Patients: EntrepreneurPulse.com.au  Fact Sheet for Healthcare Providers: IncredibleEmployment.be  This test is not yet approved or cleared by the [place] FDA and has been authorized for detection and/or diagnosis of SARS-CoV-2 by FDA under an Emergency Use Authorization (EUA). This EUA will remain in effect (meaning this test can be used) for the duration of the COVID-19 declaration under Section 564(b)(1) of the Act, 21 U.S.C. section 360bbb-3(b)(1), unless the authorization is terminated or revoked.     Resp Syncytial Virus by PCR NEGATIVE NEGATIVE Final    Comment: (NOTE) Fact Sheet for Patients: EntrepreneurPulse.com.au  Fact Sheet for Healthcare  Providers: IncredibleEmployment.be  This test is not yet approved or cleared by the [place] FDA and has been authorized for detection and/or diagnosis of SARS-CoV-2 by FDA under an Emergency Use Authorization (EUA). This EUA will remain in effect (meaning this test can be used) for the duration of the COVID-19 declaration under Section 564(b)(1) of the Act, 21 U.S.C. section 360bbb-3(b)(1), unless the authorization is terminated or revoked.  Performed at [facility], [place] [address], [place], [place] [address]          Radiology Studies: CT HIP RIGHT W CONTRAST  Result Date: 07/11/2022 CLINICAL DATA:  Follow-up on right hip and right thigh fluid collection EXAM: CT OF THE LOWER RIGHT EXTREMITY WITH CONTRAST TECHNIQUE: Multidetector CT imaging of the lower right extremity was performed according to the standard protocol following intravenous contrast administration. RADIATION DOSE REDUCTION: This exam was performed according to the departmental dose-optimization program which includes automated exposure control, adjustment of the mA and/or kV according to patient size and/or use of iterative reconstruction technique. CONTRAST:  118mL OMNIPAQUE IOHEXOL  300 MG/ML  SOLN COMPARISON:  CT examination dated July 02, 2022. FINDINGS: Bones/Joint/Cartilage Redemonstration of the hardware removal from prior right hip arthroplasty. Osseous bridging of the acetabular roof. Granulation tissues at the expected location of the femoral prosthetic head. No acute osseous abnormality or significant interval change since recent examination of March 9. Ligaments Suboptimally assessed by CT. Muscles and Tendons Generalized muscle atrophy of the right hip muscles. No intramuscular fluid collection or hematoma. Soft tissues Interval near complete resolution of the large fluid collection in the right hip/thigh with residual fluid and trace amount of subcutaneous gas suggesting  resolving infectious/inflammatory process IMPRESSION: 1. Interval near complete resolution of the large fluid collection in the right hip/thigh with residual fluid and trace amount of subcutaneous gas suggesting resolving infectious/inflammatory process. 2. Redemonstration of the hardware removal from prior right hip arthroplasty. Osseous bridging of the acetabular roof. Granulation tissues at the expected location of the femoral prosthetic head. No acute osseous abnormality or significant interval change since recent examination. Electronically Signed   By: Keane Police D.O.   On: 07/11/2022 21:40   DG ESOPHAGUS W SINGLE CM (SOL OR THIN BA)  Result Date: 07/11/2022 CLINICAL DATA:  82 year old female with complaint of difficulty swallowing solids and pills. EXAM: ESOPHAGUS/BARIUM SWALLOW/TABLET STUDY TECHNIQUE: Single contrast examination was performed using thin liquid barium. This exam was performed by Brynda Greathouse PA-C, and was supervised and interpreted by Richardean Sale, MD. FLUOROSCOPY: Radiation Exposure Index (as provided by the fluoroscopic device): 17.9 mGy Kerma COMPARISON:  CT ANGIO CHEST 07/02/2022 FINDINGS: Swallowing: Appears normal. No vestibular penetration or aspiration seen. Pharynx: Unremarkable. Esophagus: Tortuous distal esophagus without evidence of high-grade stricture, mass or mucosal ulceration. Esophageal motility: Tertiary contractions noted with retrograde flow and poor progression of barium bolus. Hiatal Hernia: Moderate sized hiatal hernia Gastroesophageal reflux: None visualized. Ingested 41mm barium tablet: Not given; patient refused. Other: Limited exam due to infected right hip, immobility, clinical status. IMPRESSION: 1. Tortuous distal esophagus without evidence of focal mucosal abnormality. 2.  Moderate sized hiatal hernia. 3. Moderate esophageal dysmotility with tertiary contractions and retrograde flow of barium bolus to the upper esophagus. 4.  No limitations above.  Electronically Signed   By: Richardean Sale M.D.   On: 07/11/2022 16:23   DG FEMUR MIN 2 VIEWS LEFT  Result Date: 07/11/2022 CLINICAL DATA:  Left hip pain and cellulitis. EXAM: LEFT FEMUR 2 VIEWS COMPARISON:  Frontal view of the bilateral hips and right hip radiographs 07/02/2022, AP pelvis 12/03/2020 FINDINGS: Moderately decreased bone mineralization. Tiny knee joint effusion. Status post total left knee arthroplasty. No perihardware lucency is seen to indicate hardware failure or loosening on the provided images. The tibial prosthetic component is only incompletely imaged on lateral view. Minimal superomedial left femoroacetabular joint space narrowing. Mild superolateral left acetabular degenerative osteophytosis. No acute fracture is seen. No dislocation. IMPRESSION: 1. Status post total left knee arthroplasty without evidence of hardware failure. 2. Minimal left femoroacetabular osteoarthritis. Electronically Signed   By: Yvonne Kendall M.D.   On: 07/11/2022 16:11        Scheduled Meds:  cholecalciferol  4,000 Units Oral Daily   ciprofloxacin  750 mg Oral BID   enoxaparin (LOVENOX) injection  60 mg Subcutaneous Q24H   ferrous sulfate  325 mg Oral Q breakfast   furosemide  80 mg Intravenous Q12H   levothyroxine  50 mcg Oral QODAY   linezolid  600 mg Oral Q12H   mirtazapine  7.5 mg Oral QHS  pantoprazole  40 mg Oral Daily   spironolactone  25 mg Oral Daily   Continuous Infusions:   LOS: 5 days    Time spent: 35 minutes    Idania Desouza A Alyse Kathan, MD [facility]   If 7PM-7AM, please contact night-coverage [email or web address]  07/13/2022, 1:49 PM

## 2022-07-13 NOTE — Plan of Care (Signed)
  Problem: Education: Goal: Knowledge of General Education information will improve Description: Including pain rating scale, medication(s)/side effects and non-pharmacologic comfort measures 07/13/2022 1744 by Jerene Pitch, RN Outcome: Progressing 07/13/2022 1640 by Jerene Pitch, RN Outcome: Progressing   Problem: Health Behavior/Discharge Planning: Goal: Ability to manage health-related needs will improve 07/13/2022 1744 by Jerene Pitch, RN Outcome: Progressing 07/13/2022 1640 by Jerene Pitch, RN Outcome: Progressing

## 2022-07-14 DIAGNOSIS — I5033 Acute on chronic diastolic (congestive) heart failure: Secondary | ICD-10-CM | POA: Diagnosis not present

## 2022-07-14 LAB — BASIC METABOLIC PANEL
Anion gap: 11 (ref 5–15)
BUN: 21 mg/dL (ref 8–23)
CO2: 22 mmol/L (ref 22–32)
Calcium: 8.3 mg/dL — ABNORMAL LOW (ref 8.9–10.3)
Chloride: 99 mmol/L (ref 98–111)
Creatinine, Ser: 1.41 mg/dL — ABNORMAL HIGH (ref 0.44–1.00)
GFR, Estimated: 37 mL/min — ABNORMAL LOW (ref >60)
Glucose, Bld: 104 mg/dL — ABNORMAL HIGH (ref 70–99)
Potassium: 3.3 mmol/L — ABNORMAL LOW (ref 3.5–5.1)
Sodium: 132 mmol/L — ABNORMAL LOW (ref 135–145)

## 2022-07-14 MED ORDER — POTASSIUM CHLORIDE 20 MEQ PO PACK
40.0000 meq | PACK | Freq: Two times a day (BID) | ORAL | Status: DC
  Administered 2022-07-14: 40 meq via ORAL
  Filled 2022-07-14: qty 2

## 2022-07-14 MED ORDER — FUROSEMIDE 40 MG PO TABS
40.0000 mg | ORAL_TABLET | Freq: Two times a day (BID) | ORAL | Status: DC
  Administered 2022-07-14: 40 mg via ORAL
  Filled 2022-07-14: qty 1

## 2022-07-14 MED ORDER — CIPROFLOXACIN HCL 750 MG PO TABS: 750.0000 mg | ORAL_TABLET | Freq: Two times a day (BID) | ORAL | 0 refills | Status: AC

## 2022-07-14 MED ORDER — SPIRONOLACTONE 25 MG PO TABS: 25.0000 mg | ORAL_TABLET | Freq: Every day | ORAL | 0 refills | Status: DC

## 2022-07-14 MED ORDER — LINEZOLID 600 MG PO TABS: 600.0000 mg | ORAL_TABLET | Freq: Two times a day (BID) | ORAL | 0 refills | Status: AC

## 2022-07-14 NOTE — TOC Progression Note (Signed)
Transition of Care [facility]) - Progression Note    Patient Details  Name: Madison [name] MRN: [id number] Date of Birth: 01-05-1941  Transition of Care [facility]) CM/SW [place], LCSW Phone Number: 07/14/2022, 1:07 PM  Clinical Narrative:     Pt to d/c to [facility], spoke with Joellen Jersey she is requesting new FL2. FL2 completed. CSW spoke with pt to inform her of d/c . Pt's rm assignment [facility] [address], call report [phone] ex [phone]. PTAR called. TOC sign off.         Expected Discharge Plan and Services         Expected Discharge Date: 07/14/22                                     Social Determinants of Health (SDOH) Interventions SDOH Screenings   Food Insecurity: No Food Insecurity (07/08/2022)  Housing: Low Risk  (07/08/2022)  Transportation Needs: No Transportation Needs (07/08/2022)  Utilities: Not At Risk (07/08/2022)  Depression (PHQ2-9): Low Risk  (05/31/2022)  Tobacco Use: Medium Risk (07/08/2022)    Readmission Risk Interventions     No data to display

## 2022-07-14 NOTE — Discharge Summary (Signed)
Physician Discharge Summary   Patient: Madison [name] MRN: [id number] DOB: 1941/02/15  Admit date:     07/08/2022  Discharge date: 07/14/22  Discharge Physician: Elmarie Shiley   PCP: Oswaldo Conroy, MD   Recommendations at discharge:   Needs Follow up with Dr Alvan Dame in 1 week Needs follow up with ID  Monitor renal function.   Discharge Diagnoses: Principal Problem:   Acute on chronic diastolic CHF (congestive heart failure) (HCC) Active Problems:   Essential hypertension   Stage 3a chronic kidney disease (CKD) (HCC)   GERD (gastroesophageal reflux disease)   Acquired hypothyroidism   Absence of hip joint, right   Class 3 obesity (HCC)   Chronic anemia  Resolved Problems:   * No resolved hospital problems. 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 Patient has had a recent hospitalization from 07/02/2022 until 07/07/2022 for right hip cellulitis complicated with sepsis. Patient was treated with IV antibiotics. ID and orthopedic were consulted. Plan was for oral antibiotics therapy with ciprofloxacin and doxycycline on 2/80/2024. Patient was at a skilled nursing facility. She had increased work of breathing for a few days, concern for pulmonary edema. Patient admitted for acute on chronic heart failure exacerbation.   Assessment and Plan: 1-Acute on chronic diastolic heart failure exacerbation: -Acute hypoxic respiratory failure secondary to HF -Echo showing normal systolic function. -Patient presented with shortness of breath, chest x-ray showed pulmonary edema. -Treated with Lasix 80 mg IV twice daily. Transition to 40 mg Oral BID. She was also started on spironolactone. Monitor renal function out patient.  -She is -5.4 L -Weight:300-----296   2- Cellulitis of the  right hip Recent admission for the same. She was prior to admission on ciprofloxacin and doxycycline She was evaluated by her admission by Ortho and ID.  Due to concern for abscess but orthopedic thought that fluid collection seen on CT was blood or seroma. -On 3/17 she was noted to have excessive drainage around the right hip area.  It looks like fluid collection that was noted on imaging yesterday has now extruded through an opening. -Infectious diseases reconsulted.  CT hip repeated which shows significant improvement compared to prior CT scan.  Most of the fluid has dissipated. -Recommend to continue with ciprofloxacin and changed Doxy to linezolid.  Need 2 more weeks of oral antibiotics, EOT 07/26/2022 Needs close follow up with Dr Alvan Dame and ID.  Continue with Wound care. Cut strip of silver hydrofiber and pack hip wound, top with foam. Change every other day. Change Foam daily and as needed if soak   3-Left thigh cellulitis: Improving x-ray left femoral no acute abnormality.   4-Dysphagia: Likely esophageal based on barium swallow study.  Dysmotility is noted.  Not extra to identified. On Dysphagia 3 diet.    Hypertension: On spironolactone.    CKD stage IIIa: Cr stable. Monitor. 1.2--1.4 Change lasix to Oral.    Hypokalemia: Replaced.    Normocytic  anemia: Hb stable. Monitor.    Hypothyroidism: Continue with Synthroid GERD: Continue with antiacid Hyponatremia; monitor on lasix.  Class III obesity: Lifestyle modification           Consultants: ID Procedures performed: None Disposition: Skilled nursing facility Diet recommendation:  Discharge Diet Orders (From admission, onward)     Start     Ordered   07/14/22 0000  Diet - low sodium heart healthy        07/14/22 1203           Cardiac diet DISCHARGE MEDICATION: Allergies as of 07/14/2022       Reactions   Keflex [cephalexin] Diarrhea   Macrobid [nitrofurantoin] Hives   Amoxicillin Rash         Medication List     STOP taking these medications    doxycycline 100 MG capsule Commonly known as: Vibramycin       TAKE these medications    acetaminophen 325 MG tablet Commonly known as: TYLENOL Take 650 mg by mouth every 6 (six) hours as needed for moderate pain.   albuterol 108 (90 Base) MCG/ACT inhaler Commonly known as: VENTOLIN HFA Inhale 2 puffs into the lungs every 6 (six) hours as needed for wheezing or shortness of breath.   bifidobacterium infantis capsule Take 1 capsule by mouth daily.   CALCIUM PLUS VITAMIN D3 PO Take 1 tablet by mouth daily. Vitamin D3 1mcg + Calcium 600   ciprofloxacin 750 MG tablet Commonly known as: CIPRO Take 1 tablet (750 mg total) by mouth 2 (two) times daily for 14 days.   ferrous sulfate 325 (65 FE) MG EC tablet Take 325 mg by mouth as directed. Once A Day on Mon, Thu   furosemide 40 MG tablet Commonly known as: LASIX Take 40 mg by mouth 2 (two) times daily.   gabapentin 300 MG capsule Commonly known as: NEURONTIN Take 300 mg by mouth at bedtime.   ipratropium-albuterol 0.5-2.5 (3) MG/3ML Soln Commonly known as: DUONEB Inhale 3 mLs into the lungs every 8 (eight) hours as needed (wheezing).   levothyroxine 25 MCG tablet Commonly known as: SYNTHROID Take 50 mcg by mouth as directed. 2 tablets (50 mcg) one time a day every 2 day(s)   linezolid 600 MG tablet Commonly known as: ZYVOX Take 1 tablet (600 mg total) by mouth every 12 (twelve) hours for 14 days.   loperamide 2 MG capsule Commonly known as: IMODIUM Take 2 mg by mouth every 4 (four) hours as needed for diarrhea or loose stools.   mineral oil-hydrophilic petrolatum ointment Apply 1 Application topically in the morning and at bedtime. Apply to (L) arm &Chest topically two times a day for Itching;Rash related to ALLERGIC CONTACT DERMATITIS DU   mirtazapine 7.5 MG tablet Commonly known as: REMERON Take 7.5 mg by mouth at bedtime.   nystatin powder Commonly  known as: MYCOSTATIN/NYSTOP Apply 1 application  topically in the morning, at noon, in the evening, and at bedtime. To genital areas and inner thigh   omeprazole 20 MG capsule Commonly known as: PRILOSEC Take 20 mg by mouth daily.   ondansetron 4 MG disintegrating tablet Commonly known as: ZOFRAN-ODT Take 4 mg by mouth every 8 (eight) hours as needed for nausea or vomiting.   OZEMPIC (0.25 OR 0.5 MG/DOSE) [place] Inject 0.25 mg into the skin once a week.   potassium chloride SA 20 MEQ tablet Commonly known as: KLOR-CON M Take 20 mEq by mouth daily.   spironolactone 25 MG tablet Commonly known as: ALDACTONE Take 1 tablet (25 mg total) by mouth daily. Start taking on: July 15, 2022   Vitamin D3 50 MCG (2000 UT) Tabs Generic drug: Cholecalciferol Take 2 tablets by mouth daily.               Discharge Care Instructions  (From admission, onward)           Start  Ordered   07/14/22 0000  Discharge wound care:       Comments: See above   07/14/22 1203            Discharge Exam: Filed Weights   07/10/22 0921 07/11/22 0500 07/14/22 0500  Weight: 134.6 kg 134.4 kg 132.3 kg   General NAD Left Hip with less redness, small wound with packing.   Condition at discharge: stable  The results of significant diagnostics from this hospitalization (including imaging, microbiology, ancillary and laboratory) are listed below for reference.   Imaging Studies: CT HIP RIGHT W CONTRAST  Result Date: 07/11/2022 CLINICAL DATA:  Follow-up on right hip and right thigh fluid collection EXAM: CT OF THE LOWER RIGHT EXTREMITY WITH CONTRAST TECHNIQUE: Multidetector CT imaging of the lower right extremity was performed according to the standard protocol following intravenous contrast administration. RADIATION DOSE REDUCTION: This exam was performed according to the departmental dose-optimization program which includes automated exposure control, adjustment of the mA and/or kV according  to patient size and/or use of iterative reconstruction technique. CONTRAST:  146mL OMNIPAQUE IOHEXOL 300 MG/ML  SOLN COMPARISON:  CT examination dated July 02, 2022. FINDINGS: Bones/Joint/Cartilage Redemonstration of the hardware removal from prior right hip arthroplasty. Osseous bridging of the acetabular roof. Granulation tissues at the expected location of the femoral prosthetic head. No acute osseous abnormality or significant interval change since recent examination of March 9. Ligaments Suboptimally assessed by CT. Muscles and Tendons Generalized muscle atrophy of the right hip muscles. No intramuscular fluid collection or hematoma. Soft tissues Interval near complete resolution of the large fluid collection in the right hip/thigh with residual fluid and trace amount of subcutaneous gas suggesting resolving infectious/inflammatory process IMPRESSION: 1. Interval near complete resolution of the large fluid collection in the right hip/thigh with residual fluid and trace amount of subcutaneous gas suggesting resolving infectious/inflammatory process. 2. Redemonstration of the hardware removal from prior right hip arthroplasty. Osseous bridging of the acetabular roof. Granulation tissues at the expected location of the femoral prosthetic head. No acute osseous abnormality or significant interval change since recent examination. Electronically Signed   By: Keane Police D.O.   On: 07/11/2022 21:40   DG ESOPHAGUS W SINGLE CM (SOL OR THIN BA)  Result Date: 07/11/2022 CLINICAL DATA:  82 year old female with complaint of difficulty swallowing solids and pills. EXAM: ESOPHAGUS/BARIUM SWALLOW/TABLET STUDY TECHNIQUE: Single contrast examination was performed using thin liquid barium. This exam was performed by Brynda Greathouse PA-C, and was supervised and interpreted by Richardean Sale, MD. FLUOROSCOPY: Radiation Exposure Index (as provided by the fluoroscopic device): 17.9 mGy Kerma COMPARISON:  CT ANGIO CHEST 07/02/2022  FINDINGS: Swallowing: Appears normal. No vestibular penetration or aspiration seen. Pharynx: Unremarkable. Esophagus: Tortuous distal esophagus without evidence of high-grade stricture, mass or mucosal ulceration. Esophageal motility: Tertiary contractions noted with retrograde flow and poor progression of barium bolus. Hiatal Hernia: Moderate sized hiatal hernia Gastroesophageal reflux: None visualized. Ingested 64mm barium tablet: Not given; patient refused. Other: Limited exam due to infected right hip, immobility, clinical status. IMPRESSION: 1. Tortuous distal esophagus without evidence of focal mucosal abnormality. 2.  Moderate sized hiatal hernia. 3. Moderate esophageal dysmotility with tertiary contractions and retrograde flow of barium bolus to the upper esophagus. 4.  No limitations above. Electronically Signed   By: Richardean Sale M.D.   On: 07/11/2022 16:23   DG FEMUR MIN 2 VIEWS LEFT  Result Date: 07/11/2022 CLINICAL DATA:  Left hip pain and cellulitis. EXAM: LEFT FEMUR 2 VIEWS COMPARISON:  Frontal view of the bilateral hips and right hip radiographs 07/02/2022, AP pelvis 12/03/2020 FINDINGS: Moderately decreased bone mineralization. Tiny knee joint effusion. Status post total left knee arthroplasty. No perihardware lucency is seen to indicate hardware failure or loosening on the provided images. The tibial prosthetic component is only incompletely imaged on lateral view. Minimal superomedial left femoroacetabular joint space narrowing. Mild superolateral left acetabular degenerative osteophytosis. No acute fracture is seen. No dislocation. IMPRESSION: 1. Status post total left knee arthroplasty without evidence of hardware failure. 2. Minimal left femoroacetabular osteoarthritis. Electronically Signed   By: Yvonne Kendall M.D.   On: 07/11/2022 16:11   DG CHEST PORT 1 VIEW  Result Date: 07/11/2022 CLINICAL DATA:  Pulmonary edema. EXAM: PORTABLE CHEST 1 VIEW COMPARISON:  07/08/2022 FINDINGS:  Prominent lung markings particularly in the left upper chest. Heart size is upper limits of normal but stable. Trachea is midline. Atherosclerotic calcifications at the aortic arch. Slightly improved aeration at the left lung base. Negative for a pneumothorax. No acute bone abnormality. IMPRESSION: 1. Slightly improved aeration at the left lung base. 2. Again noted are prominent lung markings particularly in left upper lung. Findings could be associated with asymmetric interstitial edema. Electronically Signed   By: Markus Daft M.D.   On: 07/11/2022 08:30   ECHOCARDIOGRAM COMPLETE  Result Date: 07/09/2022    ECHOCARDIOGRAM REPORT   Patient Name:   SHANDIA PORRATA Date of Exam: 07/09/2022 Medical Rec #:  [id number]     Height:       68.0 in Accession #:    [id number]    Weight:       300.0 lb Date of Birth:  11-Apr-1941     BSA:          2.428 m Patient Age:    37 years      BP:           135/62 mmHg Patient Gender: F             HR:           73 bpm. Exam Location:  Inpatient Procedure: 2D Echo, Cardiac Doppler, Color Doppler, Strain Analysis and            Intracardiac Opacification Agent Indications:    Dyspnea R06.00  History:        Patient has prior history of Echocardiogram examinations, most                 recent 02/24/2020. CHF, CKD III; Risk Factors:Former Smoker and                 Hypertension.  Sonographer:    Wilkie Aye RVT RCS Referring Phys: [id number] Dana  Sonographer Comments: Suboptimal parasternal window, suboptimal apical window, Technically difficult study due to poor echo windows and patient is obese. Image acquisition challenging due to respiratory motion and Image acquisition challenging due to patient body habitus. IMPRESSIONS  1. Left ventricular ejection fraction, by estimation, is 70 to 75%. The left ventricle has hyperdynamic function. The left ventricle has no regional wall motion abnormalities. There is mild left ventricular hypertrophy of the basal-septal segment. Left  ventricular diastolic parameters are indeterminate.  2. Right ventricular systolic function is normal. The right ventricular size is normal.  3. The mitral valve is normal in structure. No evidence of mitral valve regurgitation. No evidence of mitral stenosis.  4. The aortic valve is normal in structure. Aortic valve regurgitation is not visualized. No aortic stenosis is present.  5. The inferior vena cava is normal in size with greater than 50% respiratory variability, suggesting right atrial pressure of 3 mmHg. FINDINGS  Left Ventricle: Left ventricular ejection fraction, by estimation, is 70 to 75%. The left ventricle has hyperdynamic function. The left ventricle has no regional wall motion abnormalities. The left ventricular internal cavity size was normal in size. There is mild left ventricular hypertrophy of the basal-septal segment. Left ventricular diastolic parameters are indeterminate. Right Ventricle: The right ventricular size is normal. No increase in right ventricular wall thickness. Right ventricular systolic function is normal. Left Atrium: Left atrial size was normal in size. Right Atrium: Right atrial size was normal in size. Pericardium: There is no evidence of pericardial effusion. Mitral Valve: The mitral valve is normal in structure. No evidence of mitral valve regurgitation. No evidence of mitral valve stenosis. Tricuspid Valve: The tricuspid valve is normal in structure. Tricuspid valve regurgitation is not demonstrated. No evidence of tricuspid stenosis. Aortic Valve: The aortic valve is normal in structure. Aortic valve regurgitation is not visualized. No aortic stenosis is present. Aortic valve mean gradient measures 9.5 mmHg. Aortic valve peak gradient measures 18.1 mmHg. Aortic valve area, by VTI measures 1.58 cm. Pulmonic Valve: The pulmonic valve was normal in structure. Pulmonic valve regurgitation is not visualized. No evidence of pulmonic stenosis. Aorta: The aortic root is normal  in size and structure. Venous: The inferior vena cava is normal in size with greater than 50% respiratory variability, suggesting right atrial pressure of 3 mmHg. IAS/Shunts: No atrial level shunt detected by color flow Doppler.  LEFT VENTRICLE PLAX 2D LVIDd:         4.50 cm   Diastology LVIDs:         2.80 cm   LV e' medial:    6.42 cm/s LV PW:         1.00 cm   LV E/e' medial:  12.3 LV IVS:        1.20 cm   LV e' lateral:   9.79 cm/s LVOT diam:     1.60 cm   LV E/e' lateral: 8.0 LV SV:         62 LV SV Index:   26 LVOT Area:     2.01 cm  RIGHT VENTRICLE             IVC RV Basal diam:  3.90 cm     IVC diam: 1.50 cm RV Mid diam:    3.40 cm RV S prime:     20.30 cm/s TAPSE (M-mode): 2.5 cm LEFT ATRIUM           Index        RIGHT ATRIUM           Index LA diam:      3.20 cm 1.32 cm/m   RA Area:     16.80 cm LA Vol (A2C): 71.7 ml 29.53 ml/m  RA Volume:   41.50 ml  17.09 ml/m LA Vol (A4C): 64.6 ml 26.61 ml/m  AORTIC VALVE AV Area (Vmax):    1.39 cm AV Area (Vmean):   1.37 cm AV Area (VTI):     1.58 cm AV Vmax:           213.00 cm/s AV Vmean:          140.500 cm/s AV VTI:            0.392 m AV Peak Grad:      18.1 mmHg AV Mean Grad:  9.5 mmHg LVOT Vmax:         147.00 cm/s LVOT Vmean:        95.700 cm/s LVOT VTI:          0.308 m LVOT/AV VTI ratio: 0.79  AORTA Ao Root diam: 2.20 cm Ao Asc diam:  3.00 cm MITRAL VALVE               TRICUSPID VALVE MV Area (PHT): 2.80 cm    TR Peak grad:   18.3 mmHg MV Decel Time: 271 msec    TR Vmax:        214.00 cm/s MV E velocity: 78.80 cm/s MV A velocity: 55.80 cm/s  SHUNTS MV E/A ratio:  1.41        Systemic VTI:  0.31 m                            Systemic Diam: 1.60 cm Candee Furbish MD Electronically signed by Candee Furbish MD Signature Date/Time: 07/09/2022/12:27:40 PM    Final    DG Chest Portable 1 View  Result Date: 07/08/2022 CLINICAL DATA:  Short of breath, pneumonia EXAM: PORTABLE CHEST 1 VIEW COMPARISON:  07/06/2022 FINDINGS: Single frontal view of the chest  demonstrates stable enlargement of the cardiac silhouette. Multifocal bilateral airspace disease greatest in the right upper and left lower lung zones. Small left effusion is suspected. Continued central vascular congestion. No pneumothorax. No acute bony abnormality. IMPRESSION: 1. Constellation of findings most suggestive of congestive heart failure, with no significant change in volume status since prior study. Underlying pneumonia would be difficult to exclude. Electronically Signed   By: Randa Ngo M.D.   On: 07/08/2022 17:36   DG CHEST PORT 1 VIEW  Result Date: 07/06/2022 CLINICAL DATA:  Shortness of breath. EXAM: PORTABLE CHEST 1 VIEW COMPARISON:  None of 07/04/2022 FINDINGS: Stable cardiomediastinal contours. Lung volumes are low. Mild scratch set increase interstitial markings are identified bilaterally concerning for pulmonary edema. Cannot exclude small pleural effusions. Decreased aeration to the left base may represent atelectasis or airspace disease. IMPRESSION: 1. Suspect mild congestive heart failure. 2. Decreased aeration to the left base may represent atelectasis or airspace disease. Electronically Signed   By: Kerby Moors M.D.   On: 07/06/2022 08:05   DG CHEST PORT 1 VIEW  Result Date: 07/04/2022 CLINICAL DATA:  Shortness of breath. EXAM: PORTABLE CHEST 1 VIEW COMPARISON:  07/03/2018 for FINDINGS: Stable cardiomediastinal contours. Low lung volumes. Unchanged left base atelectasis. No signs of pleural effusion, interstitial edema or consolidation. IMPRESSION: No change in aeration to the lungs compared with previous exam. Electronically Signed   By: Kerby Moors M.D.   On: 07/04/2022 08:18   DG CHEST PORT 1 VIEW  Result Date: 07/03/2022 CLINICAL DATA:  Shortness of breath. EXAM: PORTABLE CHEST 1 VIEW COMPARISON:  07/02/2022 and prior studies FINDINGS: This is a low volume study. The cardiomediastinal silhouette is unchanged. Mild LEFT basilar opacities are noted. There is no  evidence of pneumothorax or pleural effusion. No acute bony abnormalities are noted. IMPRESSION: Mild LEFT basilar opacities, favor atelectasis but infection/pneumonia is not excluded. Electronically Signed   By: Margarette Canada M.D.   On: 07/03/2022 16:30   CT Hip Right Wo Contrast  Result Date: 07/02/2022 CLINICAL DATA:  Septic arthritis suspected. Hip x-ray done. Hip pain and redness in the right hip. EXAM: CT OF THE RIGHT HIP WITHOUT CONTRAST TECHNIQUE: Multidetector CT imaging of the right hip  was performed according to the standard protocol. Multiplanar CT image reconstructions were also generated. RADIATION DOSE REDUCTION: This exam was performed according to the departmental dose-optimization program which includes automated exposure control, adjustment of the mA and/or kV according to patient size and/or use of iterative reconstruction technique. 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 Increased callus formation about the acetabulum with redemonstrated fragmentation along the medial aspect of the acetabulum. Unchanged fragmentation of the right greater trochanter. MR nondisplaced longitudinal fracture and osseous fragmentation about the proximal femoral diaphysis. The proximal end of the femoral diaphysis has shifted anteriorly compared to 06/08/2021. Unchanged mixed soft tissue and fluid components at the site of the prior femoral head and neck component compatible with granulation tissue. There may be a central fluid component/seroma though this is similar to decreased from 06/08/2021. Ligaments Suboptimally assessed by CT. Muscles and Tendons Muscle atrophy of the gluteal muscles. Soft tissues Similar postsurgical scarring in the subcutaneous fat anterior to the right hip. Increased size of the thick-walled fluid collection in the lateral right subcutaneous fat  extending medially to the greater trochanter fragmentation. This is not completely visualized on this exam and extends laterally beyond the lateral aspect of the scan. Suggestion of adjacent stranding lateral to the fluid collection though this is largely excluded from the field-of-view. IMPRESSION: 1. 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 There is some interval healing change since 06/08/2021. 2. Unchanged granulation tissue and possible central seroma about the proximal end of the femoral diaphysis and acetabulum. 3. Increased size of the thick-walled fluid collection in the lateral right subcutaneous fat extending medially to the greater trochanter fragmentation. This is not completely visualized on this exam and extends laterally beyond the field-of-view. There is the suggestion of subcutaneous stranding lateral to the fluid collection. Clinical correlation is recommended to exclude cellulitis. If there is concern for abscess, consider further evaluation with ultrasound and/or fluid sampling. It is unclear if the large subcutaneous fluid collection communicates with the joint space. Electronically Signed   By: Placido Sou M.D.   On: 07/02/2022 23:43   DG Hip Unilat With Pelvis 2-3 Views Right  Result Date: 07/02/2022 CLINICAL DATA:  Hip pain with redness and swelling to the right hip EXAM: DG HIP (WITH OR WITHOUT PELVIS) 2-3V RIGHT COMPARISON:  CT of the right hip 06/08/2021 FINDINGS: Findings appear similar to CT 06/08/2021 given differences in technique. Changes from prior right hip arthroplasty with subsequent hardware removal. Fragmentation of the right greater trochanter. Cortical thickening and sclerosis about the proximal femoral shaft and acetabulum. No definite acute fracture. The soft tissue thickening and fluid about the right hip was better demonstrated on CT. Contrast within the bladder IMPRESSION: No definite acute fracture. Similar changes about  the right hip from right hip arthroplasty and subsequent heart were removal when compared with CT 06/08/2021. Electronically Signed   By: Placido Sou M.D.   On: 07/02/2022 22:01   CT Angio Chest PE W/Cm &/Or Wo Cm  Result Date: 07/02/2022 CLINICAL DATA:  Pulmonary embolism suspected.  High probability. EXAM: CT ANGIOGRAPHY CHEST WITH CONTRAST TECHNIQUE: Multidetector CT imaging of the chest was performed using the standard protocol during bolus administration of intravenous contrast. Multiplanar CT image reconstructions and MIPs were obtained to evaluate the vascular anatomy. RADIATION DOSE REDUCTION: This exam was performed according to the departmental dose-optimization program which includes automated exposure control, adjustment of  the mA and/or kV according to patient size and/or use of iterative reconstruction technique. CONTRAST:  27mL OMNIPAQUE IOHEXOL 350 MG/ML SOLN COMPARISON:  Portable chest today, portable chest 10/24/2020. FINDINGS: Cardiovascular: There is mild cardiomegaly and small pericardial effusion. There are scattered three-vessel coronary artery calcifications. The pulmonary veins are normal caliber. The pulmonary arteries are normal caliber and clear through the segmental divisions. Due to breathing motion, the subsegmental arterial bed is obscured and not evaluated. There is mild aortic atherosclerosis and tortuosity without aneurysm, stenosis or dissection. The great vessels are clear. Mediastinum/Nodes: There is a moderate-sized hiatal hernia with gastroesophageal reflux to the level of the aortic arch. The esophageal thickness is normal. The trachea and main bronchi are patent. There are minimal retained secretions in the right main bronchus. There is no intrathoracic or axillary adenopathy. Axillary spaces are clear. Normal thyroid. Lungs/Pleura: There small symmetric bilateral layering pleural effusions and adjacent coarse atelectatic markings in the bilateral lower lobes. There  is mild posterior atelectasis in the upper lung fields. Reticulated scarring noted both apices. No focal infiltrate or nodule is seen through the breathing motion. There is no pulmonary edema. Upper Abdomen: Status post cholecystectomy. No biliary dilatation. Moderate hepatic steatosis. No acute upper abdominal findings. Musculoskeletal: There is thoracic kyphosis, osteopenia, spondylosis and multilevel degenerative discs, multilevel bridging enthesopathy. Bilateral acromiohumeral abutment consistent with chronic rotator cuff arthropathy and degenerative tears is also noted with bilateral supraspinatus fatty atrophy. No acute or other significant musculoskeletal findings. Review of the MIP images confirms the above findings. IMPRESSION: 1. No evidence of arterial dilatation through the segmental divisions. The subsegmental arterial bed is obscured by breathing motion. 2. Cardiomegaly with small pericardial effusion and three-vessel coronary atherosclerosis. 3. Small bilateral pleural effusions with adjacent atelectasis in the lower lobes. 4. Moderate-sized hiatal hernia with gastroesophageal reflux to the level of the aortic arch. Aspiration precautions may be indicated. 5. Aortic atherosclerosis. 6. Moderate hepatic steatosis. 7. Osteopenia, kyphosis and degenerative change. 8. 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 Aortic Atherosclerosis (ICD10-I70.0). Electronically Signed   By: Telford Nab M.D.   On: 07/02/2022 21:32   DG Chest Port 1 View  Result Date: 07/02/2022 CLINICAL DATA:  Sternal chest pain, worse with movement EXAM: PORTABLE CHEST 1 VIEW COMPARISON:  Chest radiograph dated 10/24/2020 FINDINGS: Low lung volumes with bronchovascular crowding. Bibasilar and bilateral perihilar patchy opacities. 0w0 No pleural effusion or pneumothorax. The heart size and mediastinal contours are within normal limits. The visualized skeletal structures are  unremarkable. IMPRESSION: 1. Low lung volumes with bronchovascular crowding. 2. Bibasilar and bilateral perihilar patchy opacities, which could be due to atelectasis or infection. Electronically Signed   By: Darrin Nipper M.D.   On: 07/02/2022 19:08    Microbiology: Results for orders placed or performed during the hospital encounter of 07/08/22  Resp panel by RT-PCR (RSV, Flu A&B, Covid) Anterior Nasal Swab     Status: None   Collection Time: 07/08/22  5:10 PM   Specimen: Anterior Nasal Swab  Result Value Ref Range Status   SARS Coronavirus 2 by RT PCR NEGATIVE NEGATIVE Final    Comment: (NOTE) SARS-CoV-2 target nucleic acids are NOT DETECTED.  The SARS-CoV-2 RNA is generally detectable in upper respiratory specimens during the acute phase of infection. The lowest concentration of SARS-CoV-2 viral copies this assay can detect is 138 copies/mL. A negative result does not preclude SARS-Cov-2 infection and should not be used as the sole basis for treatment or other  patient management decisions. A negative result may occur with  improper specimen collection/handling, submission of specimen other than nasopharyngeal swab, presence of viral mutation(s) within the areas targeted by this assay, and inadequate number of viral copies(<138 copies/mL). A negative result must be combined with clinical observations, patient history, and epidemiological information. The expected result is Negative.  Fact Sheet for Patients:  EntrepreneurPulse.com.au  Fact Sheet for Healthcare Providers:  IncredibleEmployment.be  This test is no t yet approved or cleared by the [place] FDA and  has been authorized for detection and/or diagnosis of SARS-CoV-2 by FDA under an Emergency Use Authorization (EUA). This EUA will remain  in effect (meaning this test can be used) for the duration of the COVID-19 declaration under Section 564(b)(1) of the Act, 21 U.S.C.section  360bbb-3(b)(1), unless the authorization is terminated  or revoked sooner.       Influenza A by PCR NEGATIVE NEGATIVE Final   Influenza B by PCR NEGATIVE NEGATIVE Final    Comment: (NOTE) The Xpert Xpress SARS-CoV-2/FLU/RSV plus assay is intended as an aid in the diagnosis of influenza from Nasopharyngeal swab specimens and should not be used as a sole basis for treatment. Nasal washings and aspirates are unacceptable for Xpert Xpress SARS-CoV-2/FLU/RSV testing.  Fact Sheet for Patients: EntrepreneurPulse.com.au  Fact Sheet for Healthcare Providers: IncredibleEmployment.be  This test is not yet approved or cleared by the [place] FDA and has been authorized for detection and/or diagnosis of SARS-CoV-2 by FDA under an Emergency Use Authorization (EUA). This EUA will remain in effect (meaning this test can be used) for the duration of the COVID-19 declaration under Section 564(b)(1) of the Act, 21 U.S.C. section 360bbb-3(b)(1), unless the authorization is terminated or revoked.     Resp Syncytial Virus by PCR NEGATIVE NEGATIVE Final    Comment: (NOTE) Fact Sheet for Patients: EntrepreneurPulse.com.au  Fact Sheet for Healthcare Providers: IncredibleEmployment.be  This test is not yet approved or cleared by the [place] FDA and has been authorized for detection and/or diagnosis of SARS-CoV-2 by FDA under an Emergency Use Authorization (EUA). This EUA will remain in effect (meaning this test can be used) for the duration of the COVID-19 declaration under Section 564(b)(1) of the Act, 21 U.S.C. section 360bbb-3(b)(1), unless the authorization is terminated or revoked.  Performed at [facility], [place] [address] [place], [place] [address]     Labs: CBC: Recent Labs  Lab 07/08/22 1714 07/08/22 2208 07/09/22 0353 07/11/22 0411 07/13/22 0412  WBC 15.6* 16.7* 17.2* 12.8*  9.5  NEUTROABS 12.1*  --   --   --   --   HGB 11.7* 11.8* 11.4* 11.7* 11.9*  HCT 38.5 39.6 38.0 39.4 39.9  MCV 90.2 91.5 90.9 89.1 89.7  PLT 607* 590* 604* 601* 123XX123*   Basic Metabolic Panel: Recent Labs  Lab 07/10/22 0424 07/11/22 0411 07/12/22 0437 07/13/22 0412 07/14/22 0444  NA 136 134* 133* 130* 132*  K 3.6 3.5 3.0* 3.5 3.3*  CL 107 106 102 101 99  CO2 20* 19* 20* 20* 22  GLUCOSE 113* 111* 113* 103* 104*  BUN 21 20 20 22 21   CREATININE 1.20* 1.21* 1.21* 1.28* 1.41*  CALCIUM 7.9* 8.0* 7.7* 8.2* 8.3*  MG  --   --  1.8  --   --    Liver Function Tests: No results for input(s): "AST", "ALT", "ALKPHOS", "BILITOT", "PROT", "ALBUMIN" in the last 168 hours. CBG: No results for input(s): "GLUCAP" in the last 168 hours.  Discharge  time spent: greater than 30 minutes.  Signed: Elmarie Shiley, MD [facility] 07/14/2022

## 2022-07-14 NOTE — NC FL2 (Signed)
[place] MEDICAID FL2 LEVEL OF CARE FORM     IDENTIFICATION  Patient Name: Madison [name] Birthdate: June 02, 1940 Sex: female Admission Date (Current Location): 07/08/2022  [facility] and [place] Number:  Herbalist and Address:  [facility],  [address] [place], [place]      Provider Number: [id number]  Attending Physician Name and Address:  Elmarie Shiley, MD  Relative Name and Phone Number:  LEKEYSHA, BENGE (Spouse) [phone] (Home Phone    Current Level of Care: Hospital Recommended Level of Care: Nursing Facility Prior Approval Number:    Date Approved/Denied:   PASRR Number: [id number]  Discharge Plan: Other (Comment) (LTC)    Current Diagnoses: Patient Active Problem List   Diagnosis Date Noted   Multifocal pneumonia 07/08/2022   Leukocytosis 07/08/2022   Acute on chronic diastolic CHF (congestive heart failure) ([place]) 07/08/2022   Chronic anemia 07/08/2022   Infected abrasion of right hip, initial encounter 07/03/2022   Cellulitis of right hip 07/03/2022   Atypical chest pain 07/03/2022   Chronic diastolic CHF (congestive heart failure) ([place]) 07/03/2022   Weight gain 06/27/2022   Malaise 06/27/2022   COVID-19 virus infection 05/16/2022   Ingrown left greater toenail 04/12/2022   Dysuria 04/12/2022   Absence of hip joint, 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   Gait disorder 01/27/2022   Pressure ulcer, stage 2 ([place]) 07/12/2021   Candidiasis of skin 04/13/2021   UTI (urinary tract infection) 04/02/2021   Hypokalemia 02/26/2021   Pressure ulcer of thigh, stage 2 ([place]) 02/19/2021   Elevated liver enzymes 02/16/2021   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/16/2021   Right hip pain 02/05/2021   Sepsis ([place]) 02/05/2021   Blood loss anemia 02/05/2021   Rheumatoid arthritis ([place]) 02/05/2021   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   Acquired hypothyroidism 02/05/2021   Drug rash 02/05/2021   Stage 3a chronic kidney disease  (CKD) ([place]) 01/30/2019   Astigmatism with presbyopia, bilateral 01/22/2019   Blepharitis of both upper and lower eyelid 01/22/2019   Dermatochalasis of both upper eyelids 01/22/2019   Dry eyes, bilateral 01/22/2019   Epiretinal membrane (ERM) of both eyes 01/22/2019   Meibomian gland dysfunction (MGD) of both eyes 01/22/2019   Pseudophakia of both eyes 01/22/2019   Vitreous syneresis of both eyes 01/22/2019   Acute cystitis without hematuria 11/01/2018   E-coli UTI 06/28/2018   Malodorous urine 06/28/2018   Class 3 obesity ([place]) 09/25/2017   Obstructive sleep apnea    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   Metabolic bone disease [id number]   Vitamin D deficiency 09/01/2015   Chronic diarrhea 07/27/2015   Edema of lower extremity 11/11/2014   Cardiac disease 03/10/2014   Heart disease 03/10/2014   Encounter for preprocedural cardiovascular examination    Essential hypertension    GERD (gastroesophageal reflux disease)    Adiposity    Supraventricular tachycardia (HCC)    Decreased cardiac output    Urinary incontinence     Orientation RESPIRATION BLADDER Height & Weight     Self, Time, Situation, Place  Normal Incontinent, External catheter Weight: 291 lb 10.7 oz (132.3 kg) Height:  5\' 8"  (172.7 cm)  BEHAVIORAL SYMPTOMS/MOOD NEUROLOGICAL BOWEL NUTRITION STATUS      Incontinent Diet (Dys 3)  AMBULATORY STATUS COMMUNICATION OF NEEDS Skin   Extensive Assist Verbally Other (Comment) (Right hip cellulitis)                       Personal Care Assistance Level of  Assistance  Total care Bathing Assistance: Limited assistance Feeding assistance: Independent Dressing Assistance: Limited assistance Total Care Assistance: Limited assistance   Functional Limitations Info  Sight, Hearing, Speech Sight Info: Adequate Hearing Info: Adequate Speech Info: Adequate    SPECIAL CARE FACTORS FREQUENCY                       Contractures Contractures Info: Not present    Additional  Factors Info  Code Status Code Status Info: Full Allergies Info: Keflex (cephalexin),Macrobid (nitrofurantoin),Amoxicillin Psychotropic Info: N/A Insulin Sliding Scale Info: N/A Isolation Precautions Info: N/A Suctioning Needs: N/A   Current Medications (07/14/2022):  This is the current hospital active medication list Current Facility-Administered Medications  Medication Dose Route Frequency Provider Last Rate Last Admin   acetaminophen (TYLENOL) tablet 650 mg  650 mg Oral Q6H PRN Arrien, Jimmy Picket, MD       Or   acetaminophen (TYLENOL) suppository 650 mg  650 mg Rectal Q6H PRN Arrien, Jimmy Picket, MD       albuterol (PROVENTIL) (2.5 MG/3ML) 0.083% nebulizer solution 2.5 mg  2.5 mg Nebulization Q6H PRN Arrien, Jimmy Picket, MD       cholecalciferol (VITAMIN D3) 25 MCG (1000 UNIT) tablet 4,000 Units  4,000 Units Oral Daily Tawni Millers, MD   4,000 Units at 07/14/22 0936   ciprofloxacin (CIPRO) tablet 750 mg  750 mg Oral BID Rosiland Oz, MD   750 mg at 07/13/22 2006   enoxaparin (LOVENOX) injection 60 mg  60 mg Subcutaneous Q24H Arlyn Dunning M, RPH   60 mg at 07/14/22 [id number]   ferrous sulfate tablet 325 mg  325 mg Oral Q breakfast Arrien, Jimmy Picket, MD   325 mg at 07/14/22 [id number]   furosemide (LASIX) tablet 40 mg  40 mg Oral BID Regalado, Belkys A, MD   40 mg at 07/14/22 [id number]   levothyroxine (SYNTHROID) tablet 50 mcg  50 mcg Oral [place] Legato, MD   50 mcg at 07/14/22 0520   linezolid (ZYVOX) tablet 600 mg  600 mg Oral Q12H Rosiland Oz, MD   600 mg at 07/14/22 [id number]   mirtazapine (REMERON) tablet 7.5 mg  7.5 mg Oral QHS Tawni Millers, MD   7.5 mg at 07/13/22 2114   ondansetron (ZOFRAN) tablet 4 mg  4 mg Oral Q6H PRN Arrien, Jimmy Picket, MD       Or   ondansetron [facility]) injection 4 mg  4 mg Intravenous Q6H PRN Arrien, Jimmy Picket, MD   4 mg at 07/14/22 0933   pantoprazole (PROTONIX) EC tablet 40 mg  40 mg Oral Daily  Arrien, Jimmy Picket, MD   40 mg at 07/14/22 [id number]   potassium chloride (KLOR-CON) packet 40 mEq  40 mEq Oral BID Regalado, Belkys A, MD   40 mEq at 07/14/22 [id number]   spironolactone (ALDACTONE) tablet 25 mg  25 mg Oral Daily Tawni Millers, MD   25 mg at 07/14/22 [id number]     Discharge Medications: Please see discharge summary for a list of discharge medications.  Relevant Imaging Results:  Relevant Lab Results:   Additional Information [id number]  [place], LCSW

## 2022-07-15 ENCOUNTER — Non-Acute Institutional Stay (SKILLED_NURSING_FACILITY): Payer: Medicare Other | Admitting: Family Medicine

## 2022-07-15 DIAGNOSIS — Z89621 Acquired absence of right hip joint: Secondary | ICD-10-CM

## 2022-07-15 DIAGNOSIS — I5033 Acute on chronic diastolic (congestive) heart failure: Secondary | ICD-10-CM | POA: Diagnosis not present

## 2022-07-15 DIAGNOSIS — G4733 Obstructive sleep apnea (adult) (pediatric): Secondary | ICD-10-CM

## 2022-07-15 DIAGNOSIS — I10 Essential (primary) hypertension: Secondary | ICD-10-CM

## 2022-07-15 NOTE — Progress Notes (Signed)
Provider:  Alain Honey, MD Location:      Place of Service:     PCP: Oswaldo Conroy, MD Patient Care Team: Lurlean Nanny Teodoro Spray, MD as PCP - General (Family Medicine) Dorothy Spark, MD as PCP - Cardiology (Cardiology) Sueanne Margarita, MD as PCP - Sleep Medicine (Cardiology) Donzetta Sprung., MD (Sports Medicine) Linward Natal, MD (Ophthalmology) [name], Katharine Look, MD (Dermatology) Ronald Lobo, MD (Gastroenterology) Thereasa Distance, MD (Nephrology) Jola Baptist, [place] as Referring Physician (Chiropractic Medicine)  Extended Emergency Contact Information Primary Emergency Contact: [facility] Address: [address]          [place], [place] [address] [name] of [place] Phone: [phone] Mobile Phone: [phone] Relation: Spouse  Code Status:  Goals of Care: Advanced Directive information    07/08/2022    7:49 PM  Advanced Directives  Does Patient Have a Medical Advance Directive? Yes  Type of Advance Directive Living will;Healthcare Power of [place];Out of facility DNR (pink MOST or yellow form)  Does patient want to make changes to medical advance directive? No - Patient declined  Copy of [place] in Chart? No - copy requested  Would patient like information on creating a medical advance directive? No - Patient declined      No chief complaint on file.   HPI: Patient is a 82 y.o. female seen today for admission to [facility] after recent hospitalization.  She was initially hospitalized from March 9 to July 07, 2022 for cellulitis of the right hip complicated by sepsis.  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  Consequently she is not able to bear weight or walk ambulates in the facility with a aid of an electric scooter and a Hoyer lift to get her in and out of bed.  She was seen by  orthopedics and infectious disease and discharged on Cipro and doxycycline.  After she had been back at this facility for several days she developed shortness of breath and was sent to the hospital with presumptive diagnosis of pneumonia.  She did not have pneumonia but did have acute on chronic diastolic heart failure.  She was aggressively diuresed with 4 pounds and 5.4 L of fluid lost. On 317 there was noted to have excessive drainage around the right hip area; an open tract was noted with drainage and Cipro Doxy were changed to linezolid.  This is to be continued for 2 weeks with end of treatment at 4-24 White counts were followed throughout her hospitalization and had normalized by the time of discharge Past Medical History:  Diagnosis Date   Benign hypertension with chronic kidney disease, stage III ([place])    Overview:  Last Assessment & Plan:  Usually the patient is hypertensive. However today her blood pressure is [id number] systolic. She has been feeling fatigued with some lightheadedness. Part of this may be related to her lower blood pressure. Her pressure may be improved with her decrease in salt and fluid intake. I've instructed her to put her lisinopril/hctz on hold. I've asked her to see her primary    Cardiac disease 03/10/2014   Chronic diarrhea 07/27/2015   Edema 07/27/2015   Ejection fraction 2004   Normal, echo,    Gastroesophageal reflux disease    GERD (gastroesophageal reflux disease)    Heart disease 03/10/2014   Hypertension    Left bundle branch block 09/01/2020  Obesity    Obesity (BMI 30-39.9) 09/25/2017   OSA (obstructive sleep apnea)    mild with AHI 6.75 - on CPAP   Preop cardiovascular exam 11/2010   Cardiac clearance for knee surgery    Sleep apnea 03/10/2014   Supraventricular tachycardia    Documented episode in the past, possibly reentrant tachycardia  Overview:  Overview:  Documented episode in the past, possibly reentrant tachycardia  Last Assessment & Plan:   The patient had a documented episode in the past of a supraventricular tachycardia. It was possibly reentrant. She does well with diltiazem. No change in therapy.   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   Thyroid disease    Urinary incontinence    Past Surgical History:  Procedure Laterality Date   CATARACT EXTRACTION Left 8/11   CATARACT EXTRACTION Right 3/12   CHOLECYSTECTOMY  1994   Copsilotomy Laser Treatment Left 6/12   eye   ELBOW SURGERY  8/12   EYE SURGERY Left 09/2008   macular hole   EYE SURGERY Right 12/11   Lumbar Infusion  2011   MOLE REMOVAL  06/17/2015   TOTAL KNEE ARTHROPLASTY Left 6/11   TOTAL KNEE ARTHROPLASTY Right 06/13/2011    reports that she has quit smoking. She has never used smokeless tobacco. She reports that she does not drink alcohol and does not use drugs. Social History   Socioeconomic History   Marital status: Married    Spouse name: Not on file   Number of children: Not on file   Years of education: Not on file   Highest education level: Not on file  Occupational History   Not on file  Tobacco Use   Smoking status: Former   Smokeless tobacco: Never  Vaping Use   Vaping Use: Never used  Substance and Sexual Activity   Alcohol use: No   Drug use: No   Sexual activity: Not on file  Other Topics Concern   Not on file  Social History Narrative   Not on file   Social Determinants of Health   Financial Resource Strain: Not on file  Food Insecurity: No Food Insecurity (07/08/2022)   Hunger Vital Sign    Worried About Running Out of Food in the Last Year: Never true    Ran Out of Food in the Last Year: Never true  Transportation Needs: No Transportation Needs (07/08/2022)   PRAPARE - Hydrologist (Medical): No    Lack of Transportation (Non-Medical): No  Physical Activity: Not on file  Stress: Not on file  Social Connections: Not on file   Intimate Partner Violence: Not At Risk (07/08/2022)   Humiliation, Afraid, Rape, and Kick questionnaire    Fear of Current or Ex-Partner: No    Emotionally Abused: No    Physically Abused: No    Sexually Abused: No    Functional Status Survey:    Family History  Problem Relation Age of Onset   High blood pressure Mother    Diabetes Mother    Heart Problems Father    Diabetes Father    Prostate cancer Father    Heart attack Father    Heart attack Paternal Grandmother    Heart attack Maternal Grandfather    Heart attack Paternal Uncle    Stroke Neg Hx     Health Maintenance  Topic Date Due   COVID-19 Vaccine (8 - 2023-24 season) 07/24/2022 (Originally 04/21/2022)   Medicare  Annual Wellness (AWV)  11/12/2022   DTaP/Tdap/Td (5 - Td or Tdap) 11/25/2030   Pneumonia Vaccine 87+ Years old  Completed   INFLUENZA VACCINE  Completed   DEXA SCAN  Completed   Zoster Vaccines- Shingrix  Completed   HPV VACCINES  Aged Out    Allergies  Allergen Reactions   Keflex [Cephalexin] Diarrhea   Macrobid [Nitrofurantoin] Hives   Amoxicillin Rash    Outpatient Encounter Medications as of 07/15/2022  Medication Sig   acetaminophen (TYLENOL) 325 MG tablet Take 650 mg by mouth every 6 (six) hours as needed for moderate pain.   albuterol (VENTOLIN HFA) 108 (90 Base) MCG/ACT inhaler Inhale 2 puffs into the lungs every 6 (six) hours as needed for wheezing or shortness of breath.   bifidobacterium infantis (ALIGN) capsule Take 1 capsule by mouth daily.   Calcium Carb-Cholecalciferol (CALCIUM PLUS VITAMIN D3 PO) Take 1 tablet by mouth daily. Vitamin D3 44mcg + Calcium 600   Cholecalciferol (VITAMIN D3) 50 MCG (2000 UT) TABS Take 2 tablets by mouth daily.   ciprofloxacin (CIPRO) 750 MG tablet Take 1 tablet (750 mg total) by mouth 2 (two) times daily for 14 days.   ferrous sulfate 325 (65 FE) MG EC tablet Take 325 mg by mouth as directed. Once A Day on Mon, Thu   furosemide (LASIX) 40 MG tablet  Take 40 mg by mouth 2 (two) times daily.   gabapentin (NEURONTIN) 300 MG capsule Take 300 mg by mouth at bedtime.   ipratropium-albuterol (DUONEB) 0.5-2.5 (3) MG/3ML SOLN Inhale 3 mLs into the lungs every 8 (eight) hours as needed (wheezing).   levothyroxine (SYNTHROID) 25 MCG tablet Take 50 mcg by mouth as directed. 2 tablets (50 mcg) one time a day every 2 day(s)   linezolid (ZYVOX) 600 MG tablet Take 1 tablet (600 mg total) by mouth every 12 (twelve) hours for 14 days.   loperamide (IMODIUM) 2 MG capsule Take 2 mg by mouth every 4 (four) hours as needed for diarrhea or loose stools.   mineral oil-hydrophilic petrolatum (AQUAPHOR) ointment Apply 1 Application topically in the morning and at bedtime. Apply to (L) arm &Chest topically two times a day for Itching;Rash related to ALLERGIC CONTACT DERMATITIS DU   mirtazapine (REMERON) 7.5 MG tablet Take 7.5 mg by mouth at bedtime.   nystatin (MYCOSTATIN/NYSTOP) powder Apply 1 application  topically in the morning, at noon, in the evening, and at bedtime. To genital areas and inner thigh   omeprazole (PRILOSEC) 20 MG capsule Take 20 mg by mouth daily.   ondansetron (ZOFRAN-ODT) 4 MG disintegrating tablet Take 4 mg by mouth every 8 (eight) hours as needed for nausea or vomiting.   potassium chloride SA (KLOR-CON) 20 MEQ tablet Take 20 mEq by mouth daily.   Semaglutide (OZEMPIC, 0.25 OR 0.5 MG/DOSE, [place]) Inject 0.25 mg into the skin once a week.   spironolactone (ALDACTONE) 25 MG tablet Take 1 tablet (25 mg total) by mouth daily.   No facility-administered encounter medications on file as of 07/15/2022.    Review of Systems  Constitutional:  Positive for activity change and fatigue.  HENT: Negative.    Respiratory:  Positive for shortness of breath.   Musculoskeletal:  Positive for arthralgias.  Hematological: Negative.   Psychiatric/Behavioral: Negative.    All other systems reviewed and are negative.   There were no vitals filed for this  visit. There is no height or weight on file to calculate BMI. Physical Exam Vitals and nursing note reviewed.  Constitutional:      Appearance: She is obese.  HENT:     Mouth/Throat:     Mouth: Mucous membranes are moist.     Pharynx: Oropharynx is clear.  Eyes:     Extraocular Movements: Extraocular movements intact.     Pupils: Pupils are equal, round, and reactive to light.  Cardiovascular:     Rate and Rhythm: Normal rate and regular rhythm.  Pulmonary:     Effort: Pulmonary effort is normal.     Breath sounds: Normal breath sounds.  Abdominal:     General: Bowel sounds are normal.     Palpations: Abdomen is soft.     Tenderness: There is no abdominal tenderness. There is no rebound.  Skin:    Comments: There persists some erythema anterolaterally around the right hip.  There is no drainage present this morning.  Local care being provided for this area  Neurological:     General: No focal deficit present.     Mental Status: She is alert and oriented to person, place, and time.  Psychiatric:        Mood and Affect: Mood normal.        Behavior: Behavior normal.     Labs reviewed: Basic Metabolic Panel: Recent Labs    07/04/22 0502 07/05/22 0538 07/06/22 0702 07/08/22 1714 07/12/22 0437 07/13/22 0412 07/14/22 0444  NA 134* 135 136   < > 133* 130* 132*  K 4.2 3.7 3.4*   < > 3.0* 3.5 3.3*  CL 104 106 109   < > 102 101 99  CO2 20* 19* 19*   < > 20* 20* 22  GLUCOSE 96 94 106*   < > 113* 103* 104*  BUN 25* 24* 20   < > 20 22 21   CREATININE 1.21* 1.14* 1.01*   < > 1.21* 1.28* 1.41*  CALCIUM 8.2* 8.4* 8.4*   < > 7.7* 8.2* 8.3*  MG 2.4 2.4 2.4  --  1.8  --   --   PHOS 3.6 4.3 3.6  --   --   --   --    < > = values in this interval not displayed.   Liver Function Tests: Recent Labs    07/04/22 0502 07/05/22 0538 07/06/22 0702  AST 23 30 36  ALT 17 21 25   ALKPHOS 71 66 63  BILITOT 0.6 0.6 0.3  PROT 6.2* 6.3* 6.4*  ALBUMIN 2.2* 2.2* 2.3*   No results for  input(s): "LIPASE", "AMYLASE" in the last 8760 hours. No results for input(s): "AMMONIA" in the last 8760 hours. CBC: Recent Labs    07/05/22 0538 07/06/22 0702 07/08/22 1714 07/08/22 2208 07/09/22 0353 07/11/22 0411 07/13/22 0412  WBC 11.7* 11.9* 15.6*   < > 17.2* 12.8* 9.5  NEUTROABS 9.5* 9.5* 12.1*  --   --   --   --   HGB 11.4* 10.8* 11.7*   < > 11.4* 11.7* 11.9*  HCT 38.5 37.0 38.5   < > 38.0 39.4 39.9  MCV 92.3 91.8 90.2   < > 90.9 89.1 89.7  PLT 507* 536* 607*   < > 604* 601* 466*   < > = values in this interval not displayed.   Cardiac Enzymes: Recent Labs    07/06/22 0702  CKTOTAL 12*   BNP: Invalid input(s): "POCBNP" No results found for: "HGBA1C" Lab Results  Component Value Date   TSH 4.51 06/30/2021   Lab Results  Component Value Date   [id number]  07/04/2022   Lab Results  Component Value Date   FOLATE 11.3 07/04/2022   Lab Results  Component Value Date   IRON 18 (L) 07/09/2022   TIBC 155 (L) 07/09/2022   FERRITIN 772 (H) 07/09/2022    Imaging and Procedures obtained prior to SNF admission: ECHOCARDIOGRAM COMPLETE  Result Date: 07/09/2022    ECHOCARDIOGRAM REPORT   Patient Name:   Madison [name] Date of Exam: 07/09/2022 Medical Rec #:  [id number]     Height:       68.0 in Accession #:    [id number]    Weight:       300.0 lb Date of Birth:  07-Jun-1940     BSA:          2.428 m Patient Age:    65 years      BP:           135/62 mmHg Patient Gender: F             HR:           73 bpm. Exam Location:  Inpatient Procedure: 2D Echo, Cardiac Doppler, Color Doppler, Strain Analysis and            Intracardiac Opacification Agent Indications:    Dyspnea R06.00  History:        Patient has prior history of Echocardiogram examinations, most                 recent 02/24/2020. CHF, CKD III; Risk Factors:Former Smoker and                 Hypertension.  Sonographer:    Wilkie Aye RVT RCS Referring Phys: [id number] [place]  Sonographer Comments: Suboptimal  parasternal window, suboptimal apical window, Technically difficult study due to poor echo windows and patient is obese. Image acquisition challenging due to respiratory motion and Image acquisition challenging due to patient body habitus. IMPRESSIONS  1. Left ventricular ejection fraction, by estimation, is 70 to 75%. The left ventricle has hyperdynamic function. The left ventricle has no regional wall motion abnormalities. There is mild left ventricular hypertrophy of the basal-septal segment. Left ventricular diastolic parameters are indeterminate.  2. Right ventricular systolic function is normal. The right ventricular size is normal.  3. The mitral valve is normal in structure. No evidence of mitral valve regurgitation. No evidence of mitral stenosis.  4. The aortic valve is normal in structure. Aortic valve regurgitation is not visualized. No aortic stenosis is present.  5. The inferior vena cava is normal in size with greater than 50% respiratory variability, suggesting right atrial pressure of 3 mmHg. FINDINGS  Left Ventricle: Left ventricular ejection fraction, by estimation, is 70 to 75%. The left ventricle has hyperdynamic function. The left ventricle has no regional wall motion abnormalities. The left ventricular internal cavity size was normal in size. There is mild left ventricular hypertrophy of the basal-septal segment. Left ventricular diastolic parameters are indeterminate. Right Ventricle: The right ventricular size is normal. No increase in right ventricular wall thickness. Right ventricular systolic function is normal. Left Atrium: Left atrial size was normal in size. Right Atrium: Right atrial size was normal in size. Pericardium: There is no evidence of pericardial effusion. Mitral Valve: The mitral valve is normal in structure. No evidence of mitral valve regurgitation. No evidence of mitral valve stenosis. Tricuspid Valve: The tricuspid valve is normal in structure. Tricuspid valve  regurgitation is not demonstrated. No evidence of tricuspid stenosis. Aortic Valve: The aortic  valve is normal in structure. Aortic valve regurgitation is not visualized. No aortic stenosis is present. Aortic valve mean gradient measures 9.5 mmHg. Aortic valve peak gradient measures 18.1 mmHg. Aortic valve area, by VTI measures 1.58 cm. Pulmonic Valve: The pulmonic valve was normal in structure. Pulmonic valve regurgitation is not visualized. No evidence of pulmonic stenosis. Aorta: The aortic root is normal in size and structure. Venous: The inferior vena cava is normal in size with greater than 50% respiratory variability, suggesting right atrial pressure of 3 mmHg. IAS/Shunts: No atrial level shunt detected by color flow Doppler.  LEFT VENTRICLE PLAX 2D LVIDd:         4.50 cm   Diastology LVIDs:         2.80 cm   LV e' medial:    6.42 cm/s LV PW:         1.00 cm   LV E/e' medial:  12.3 LV IVS:        1.20 cm   LV e' lateral:   9.79 cm/s LVOT diam:     1.60 cm   LV E/e' lateral: 8.0 LV SV:         62 LV SV Index:   26 LVOT Area:     2.01 cm  RIGHT VENTRICLE             IVC RV Basal diam:  3.90 cm     IVC diam: 1.50 cm RV Mid diam:    3.40 cm RV S prime:     20.30 cm/s TAPSE (M-mode): 2.5 cm LEFT ATRIUM           Index        RIGHT ATRIUM           Index LA diam:      3.20 cm 1.32 cm/m   RA Area:     16.80 cm LA Vol (A2C): 71.7 ml 29.53 ml/m  RA Volume:   41.50 ml  17.09 ml/m LA Vol (A4C): 64.6 ml 26.61 ml/m  AORTIC VALVE AV Area (Vmax):    1.39 cm AV Area (Vmean):   1.37 cm AV Area (VTI):     1.58 cm AV Vmax:           213.00 cm/s AV Vmean:          140.500 cm/s AV VTI:            0.392 m AV Peak Grad:      18.1 mmHg AV Mean Grad:      9.5 mmHg LVOT Vmax:         147.00 cm/s LVOT Vmean:        95.700 cm/s LVOT VTI:          0.308 m LVOT/AV VTI ratio: 0.79  AORTA Ao Root diam: 2.20 cm Ao Asc diam:  3.00 cm MITRAL VALVE               TRICUSPID VALVE MV Area (PHT): 2.80 cm    TR Peak grad:   18.3 mmHg MV  Decel Time: 271 msec    TR Vmax:        214.00 cm/s MV E velocity: 78.80 cm/s MV A velocity: 55.80 cm/s  SHUNTS MV E/A ratio:  1.41        Systemic VTI:  0.31 m                            Systemic Diam:  1.60 cm Candee Furbish MD Electronically signed by Candee Furbish MD Signature Date/Time: 07/09/2022/12:27:40 PM    Final    DG Chest Portable 1 View  Result Date: 07/08/2022 CLINICAL DATA:  Short of breath, pneumonia EXAM: PORTABLE CHEST 1 VIEW COMPARISON:  07/06/2022 FINDINGS: Single frontal view of the chest demonstrates stable enlargement of the cardiac silhouette. Multifocal bilateral airspace disease greatest in the right upper and left lower lung zones. Small left effusion is suspected. Continued central vascular congestion. No pneumothorax. No acute bony abnormality. IMPRESSION: 1. Constellation of findings most suggestive of congestive heart failure, with no significant change in volume status since prior study. Underlying pneumonia would be difficult to exclude. Electronically Signed   By: Randa Ngo M.D.   On: 07/08/2022 17:36    Assessment/Plan 1. Absence of hip joint, right Patient is nonambulatory and moves around facility on her scooter  2. Acute on chronic diastolic CHF (congestive heart failure) (HCC) Exacerbation of failure with reason for recent hospitalization appropriately diuresed.  Asymptomatic at this time  3. Class 3 obesity ([place]) Before recent hospitalization we were in the process of evaluating best approach with her obesity.  Mirtazapine had been stopped and there was consideration of use of an anorectic to help her lose weight.  Revisit that situation in the coming weeks  4. Essential hypertension  Blood pressures have been controlled on Aldactone 5. Obstructive sleep apnea Using CPAP    Family/ staff Communication:   Labs/tests ordered:  Lillette Boxer. Sabra Heck, [facility] [address] [place], [place] Office [phone]

## 2022-07-20 ENCOUNTER — Non-Acute Institutional Stay (SKILLED_NURSING_FACILITY): Payer: Medicare Other | Admitting: Nurse Practitioner

## 2022-07-20 DIAGNOSIS — N939 Abnormal uterine and vaginal bleeding, unspecified: Secondary | ICD-10-CM

## 2022-07-20 DIAGNOSIS — K2100 Gastro-esophageal reflux disease with esophagitis, without bleeding: Secondary | ICD-10-CM | POA: Diagnosis not present

## 2022-07-20 DIAGNOSIS — I5033 Acute on chronic diastolic (congestive) heart failure: Secondary | ICD-10-CM | POA: Diagnosis not present

## 2022-07-20 DIAGNOSIS — E871 Hypo-osmolality and hyponatremia: Secondary | ICD-10-CM

## 2022-07-20 DIAGNOSIS — D500 Iron deficiency anemia secondary to blood loss (chronic): Secondary | ICD-10-CM

## 2022-07-20 DIAGNOSIS — L02415 Cutaneous abscess of right lower limb: Secondary | ICD-10-CM

## 2022-07-20 NOTE — Assessment & Plan Note (Signed)
No pain, itching, or discharge, bright red blood noted on adult depend and digital exam, will try Estrace vaginal suppository MWF, update CBC/diff, CMP/eGFR in am, may GYN referral if persists.

## 2022-07-20 NOTE — Progress Notes (Signed)
Location:   SNF [place] Room Number: 93 Place of Service:  SNF (31) Provider: Lennie Odor Tamikka Pilger NP  Heffner, Teodoro Spray, MD  Patient Care Team: Heffner, Teodoro Spray, MD as PCP - General (Family Medicine) Dorothy Spark, MD as PCP - Cardiology (Cardiology) Sueanne Margarita, MD as PCP - Sleep Medicine (Cardiology) Donzetta Sprung., MD (Sports Medicine) Linward Natal, MD (Ophthalmology) [name], Katharine Look, MD (Dermatology) Ronald Lobo, MD (Gastroenterology) Thereasa Distance, MD (Nephrology) Jola Baptist, [place] as Referring Physician (Chiropractic Medicine)  Extended Emergency Contact Information Primary Emergency Contact: [facility] Address: [address]          [place], [place] [address] [name] of [place] Phone: [phone] Mobile Phone: [phone] Relation: Spouse  Code Status: DNR Goals of care: Advanced Directive information    07/08/2022    7:49 PM  Advanced Directives  Does Patient Have a Medical Advance Directive? Yes  Type of Advance Directive Living will;Healthcare Power of [place];Out of facility DNR (pink MOST or yellow form)  Does patient want to make changes to medical advance directive? No - Patient declined  Copy of [place] in Chart? No - copy requested  Would patient like information on creating a medical advance directive? No - Patient declined     Chief Complaint  Patient presents with   Acute Visit    Vaginal bleed    HPI:  Pt is a 82 y.o. female seen today for an acute visit for bright red vaginal bleed, on adult depend, vaginal introitus, and showed on my digital exam finger, denied pain, itching, no noted odor, no c/o dysuria or lower abd pain.   Hospitalized 07/08/22 for CHF, abscess of the right hip which previous infected, treated, and prosthesis was removed and never replaced. On Spironolactone, Furosemide, Cipro, Linezolid.    Hospitalized 07/02/22-07/07/22 for the right hip  cellulitis, transitioned to oral Cipro and Doxycycline until 08/01/22 from IV antibiotics in the hospital, f/u ID 2 weeks, R hip region warmth, redness, swelling present. CT hip in hospital fluid collection unclear if it communicates with the joint space, Ortho not felt to be related to the joint space and may be an old fluid collection, aspiration not recommended.              Hypoxia, weaned off O2 after well diuresed.              Weight gained, off Mirtazapine.  Elevated AST/ALT normalized, S/p cholecystectomy, [place] 02/19/21 no cyst or mass.               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 F/u Ortho. TDWB. Power wc. 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 Fully treated, then off antibiotics.              Anemia, post op, s/p EGD no active bleed. S/p 6 u PRBC transfusion, ASA was dc'd, On Fe, Iron 15 in hospital, Hgb 11.9 07/13/22             HTN only on Furosemide, Spironolactone.              Morbid obesity             OSA CPAP             Chronic diarrhea, prn Imodium, Align             RA f/u Rheumatology, hx of  Plaquenil use, stable presently.              Insomnia/depression/anxiety, stable, off Mirtazapine.              GERD, nauseated sometimes, will try Pantoprazole, off  Omeprazole, prn Zofran. Hgb 11.9 07/13/22             CHF/Edema, not apparent, takes Furosemide, Spironolactone CKD stage 3 Bun/creat 21/1.41 07/14/22 Hyponatremia, Na 132 07/14/22              Hypothyroidism, takes Levothyroxine, TSH 3.48 06/28/22 Past Medical History:  Diagnosis Date   Benign hypertension with chronic kidney disease, stage III ([place])    Overview:  Last Assessment & Plan:  Usually the patient is hypertensive. However today her blood pressure is 123456 systolic. She has been feeling fatigued with some lightheadedness. Part of this may be related to her lower blood pressure. Her pressure may be improved with her decrease in salt and fluid  intake. I've instructed her to put her lisinopril/hctz on hold. 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 It was possibly reentrant. She does well with diltiazem. No change in therapy.   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   Thyroid disease    Urinary incontinence    Past Surgical History:  Procedure Laterality Date   CATARACT EXTRACTION Left 8/11   CATARACT EXTRACTION Right 3/12   CHOLECYSTECTOMY  1994   Copsilotomy Laser Treatment Left 6/12   eye   ELBOW SURGERY  8/12   EYE SURGERY Left 09/2008   macular hole   EYE SURGERY Right 12/11   Lumbar Infusion  2011   MOLE REMOVAL  06/17/2015   TOTAL KNEE ARTHROPLASTY Left 6/11   TOTAL KNEE ARTHROPLASTY Right 06/13/2011    Allergies  Allergen Reactions   Keflex [Cephalexin] Diarrhea   Macrobid [Nitrofurantoin] Hives   Amoxicillin Rash    Allergies as of 07/20/2022       Reactions   Keflex [cephalexin] Diarrhea   Macrobid [nitrofurantoin] Hives   Amoxicillin Rash        Medication List        Accurate as of July 20, 2022 11:59 PM. If you have any questions, ask your nurse or doctor.           acetaminophen 325 MG tablet Commonly known as: TYLENOL Take 650 mg by mouth every 6 (six) hours as needed for moderate pain.   albuterol 108 (90 Base) MCG/ACT inhaler Commonly known as: VENTOLIN HFA Inhale 2 puffs into the lungs every 6 (six) hours as needed for wheezing or shortness of breath.  bifidobacterium infantis capsule Take 1 capsule by mouth daily.   CALCIUM PLUS VITAMIN D3 PO Take 1 tablet by mouth daily. Vitamin D3 44mcg + Calcium 600   ciprofloxacin 750 MG tablet Commonly known as: CIPRO Take 1 tablet (750 mg total) by mouth 2 (two) times daily for 14 days.   ferrous sulfate 325 (65 FE) MG EC tablet Take 325 mg by mouth as directed. Once A Day on Mon, Thu   furosemide 40 MG tablet Commonly known as: LASIX Take 40 mg by mouth 2 (two) times daily.   gabapentin 300 MG capsule Commonly known as: NEURONTIN Take 300 mg by mouth at bedtime.   ipratropium-albuterol 0.5-2.5 (3) MG/3ML Soln Commonly known as: DUONEB Inhale 3 mLs into the lungs every 8 (eight) hours as needed (wheezing).   levothyroxine 25 MCG tablet Commonly known as: SYNTHROID Take 50 mcg by mouth as directed. 2 tablets (50 mcg) one time a day every 2 day(s)   linezolid 600 MG tablet Commonly known as: ZYVOX Take 1 tablet (600 mg total) by mouth every 12 (twelve) hours for 14 days.   loperamide 2 MG capsule Commonly known as: IMODIUM Take 2 mg by mouth every 4 (four) hours as needed for diarrhea or loose stools.   mineral oil-hydrophilic petrolatum ointment Apply 1 Application topically in the morning and at bedtime. Apply to (L) arm &Chest topically two times a day for Itching;Rash related to ALLERGIC CONTACT DERMATITIS DU   mirtazapine 7.5 MG tablet Commonly known as: REMERON Take 7.5 mg by mouth at bedtime.   nystatin powder Commonly known as: MYCOSTATIN/NYSTOP Apply 1 application  topically in the morning, at noon, in the evening, and at bedtime. To genital areas and inner  thigh   omeprazole 20 MG capsule Commonly known as: PRILOSEC Take 20 mg by mouth daily.   ondansetron 4 MG disintegrating tablet Commonly known as: ZOFRAN-ODT Take 4 mg by mouth every 8 (eight) hours as needed for nausea or vomiting.   OZEMPIC (0.25 OR 0.5 MG/DOSE) [place] Inject 0.25 mg into the skin once a week.   potassium chloride SA 20 MEQ tablet Commonly known as: KLOR-CON M Take 20 mEq by mouth daily.   spironolactone 25 MG tablet Commonly known as: ALDACTONE Take 1 tablet (25 mg total) by mouth daily.   Vitamin D3 50 MCG (2000 UT) Tabs Generic drug: Cholecalciferol Take 2 tablets by mouth daily.        Review of Systems  Constitutional:  Positive for appetite change and fatigue. Negative for chills and fever.  HENT:  Negative for congestion, sore throat and trouble swallowing.   Eyes:  Negative for visual disturbance.  Respiratory:  Negative for cough, shortness of breath and wheezing.   Cardiovascular:  Negative for leg swelling.  Gastrointestinal:  Positive for nausea. Negative for abdominal pain and constipation.  Genitourinary:  Positive for vaginal bleeding. Negative for difficulty urinating, dysuria, frequency, genital sores, urgency, vaginal discharge and vaginal pain.  Musculoskeletal:  Positive for arthralgias and gait problem.  Skin:  Positive for wound. Negative for color change.       Right hip region redness, warmth, swelling.   Neurological:  Negative for speech difficulty and weakness.  Psychiatric/Behavioral:  Negative for confusion and sleep disturbance. The patient is not nervous/anxious.     Immunization History  Administered Date(s) Administered   Influenza Split 01/28/2008, 02/17/2009, 02/20/2012, 02/11/2013, 02/02/2016, 01/11/2017, 02/04/2019   Influenza, High Dose Seasonal PF 02/04/2015, 01/11/2017, 01/30/2018, 02/05/2020   Influenza-Unspecified 02/16/2022  Moderna SARS-COV2 Booster Vaccination 02/24/2022   Moderna Sars-Covid-2 Vaccination  05/09/2019, 05/27/2019, 03/03/2020, 09/04/2020, 11/04/2020   Pfizer Covid-19 Vaccine Bivalent Booster 57yrs & up 02/09/2021   Pneumococcal Conjugate-13 08/30/2013   Pneumococcal Polysaccharide-23 11/03/2005   RSV,unspecified 04/22/2022   Td 08/01/2001, 09/05/2019   Tdap 09/22/2010, 11/24/2020   Zoster Recombinat (Shingrix) 11/08/2016, 01/11/2017   Zoster, Live 11/04/2005, 11/08/2016, 01/11/2017   Zoster, Unspecified 01/11/2017   Pertinent  Health Maintenance Due  Topic Date Due   INFLUENZA VACCINE  Completed   DEXA SCAN  Completed      10/24/2020   11:31 AM 04/15/2022   10:15 AM 04/27/2022   11:35 AM 05/31/2022    9:34 AM  Fall Risk  Falls in the past year?  0 0 0  Was there an injury with Fall?  0 0 0  Fall Risk Category Calculator  0 0 0  Fall Risk Category (Retired)  Low Low   (RETIRED) Patient Fall Risk Level Moderate fall risk Moderate fall risk Moderate fall risk   Patient at Risk for Falls Due to  History of fall(s) History of fall(s) History of fall(s)  Fall risk Follow up  Falls evaluation completed Falls evaluation completed Falls evaluation completed   Functional Status Survey:    Vitals:   07/21/22 1205  BP: 114/62  Pulse: 82  Resp: 18  Temp: 97.6 F (36.4 C)  SpO2: 98%   There is no height or weight on file to calculate BMI. Physical Exam Vitals and nursing note reviewed.  Constitutional:      Appearance: She is obese.     Comments: Feeling tired  HENT:     Head: Normocephalic and atraumatic.     Nose: Nose normal.     Mouth/Throat:     Mouth: Mucous membranes are moist.  Eyes:     Extraocular Movements: Extraocular movements intact.     Conjunctiva/sclera: Conjunctivae normal.     Pupils: Pupils are equal, round, and reactive to light.  Cardiovascular:     Rate and Rhythm: Normal rate and regular rhythm.     Heart sounds: No murmur heard. Pulmonary:     Breath sounds: Wheezing present. No rhonchi or rales.     Comments: Needs O2 [place] to maintain  Sat O2>90% Abdominal:     General: Bowel sounds are normal.     Palpations: Abdomen is soft.     Tenderness: There is no abdominal tenderness.  Genitourinary:    Comments: No urogenital irritation or skin breakdown, bright red blood on adult brief, vaginal introitus, and on my digital exam finger  Musculoskeletal:     Cervical back: Normal range of motion and neck supple.     Right lower leg: No edema.     Left lower leg: No edema.     Comments: R hip, prosthetic hip removed.   Skin:    General: Skin is warm and dry.     Findings: Erythema present.     Comments: Right hip surgical scar, right hip region redness, warmth, swelling.   Neurological:     General: No focal deficit present.     Mental Status: She is alert and oriented to person, place, and time.     Gait: Gait normal.  Psychiatric:        Mood and Affect: Mood normal.        Behavior: Behavior normal.        Thought Content: Thought content normal.        Judgment:  Judgment normal.     Labs reviewed: Recent Labs    07/04/22 0502 07/05/22 0538 07/06/22 0702 07/08/22 1714 07/12/22 0437 07/13/22 0412 07/14/22 0444  NA 134* 135 136   < > 133* 130* 132*  K 4.2 3.7 3.4*   < > 3.0* 3.5 3.3*  CL 104 106 109   < > 102 101 99  CO2 20* 19* 19*   < > 20* 20* 22  GLUCOSE 96 94 106*   < > 113* 103* 104*  BUN 25* 24* 20   < > 20 22 21   CREATININE 1.21* 1.14* 1.01*   < > 1.21* 1.28* 1.41*  CALCIUM 8.2* 8.4* 8.4*   < > 7.7* 8.2* 8.3*  MG 2.4 2.4 2.4  --  1.8  --   --   PHOS 3.6 4.3 3.6  --   --   --   --    < > = values in this interval not displayed.   Recent Labs    07/04/22 0502 07/05/22 0538 07/06/22 0702  AST 23 30 36  ALT 17 21 25   ALKPHOS 71 66 63  BILITOT 0.6 0.6 0.3  PROT 6.2* 6.3* 6.4*  ALBUMIN 2.2* 2.2* 2.3*   Recent Labs    07/05/22 0538 07/06/22 0702 07/08/22 1714 07/08/22 2208 07/09/22 0353 07/11/22 0411 07/13/22 0412  WBC 11.7* 11.9* 15.6*   < > 17.2* 12.8* 9.5  NEUTROABS 9.5* 9.5* 12.1*   --   --   --   --   HGB 11.4* 10.8* 11.7*   < > 11.4* 11.7* 11.9*  HCT 38.5 37.0 38.5   < > 38.0 39.4 39.9  MCV 92.3 91.8 90.2   < > 90.9 89.1 89.7  PLT 507* 536* 607*   < > 604* 601* 466*   < > = values in this interval not displayed.   Lab Results  Component Value Date   TSH 4.51 06/30/2021   No results found for: "HGBA1C" No results found for: "CHOL", "HDL", "LDLCALC", "LDLDIRECT", "TRIG", "CHOLHDL"  Significant Diagnostic Results in last 30 days:  CT HIP RIGHT W CONTRAST  Result Date: 07/11/2022 CLINICAL DATA:  Follow-up on right hip and right thigh fluid collection EXAM: CT OF THE LOWER RIGHT EXTREMITY WITH CONTRAST TECHNIQUE: Multidetector CT imaging of the lower right extremity was performed according to the standard protocol following intravenous contrast administration. RADIATION DOSE REDUCTION: This exam was performed according to the departmental dose-optimization program which includes automated exposure control, adjustment of the mA and/or kV according to patient size and/or use of iterative reconstruction technique. CONTRAST:  151mL OMNIPAQUE IOHEXOL 300 MG/ML  SOLN COMPARISON:  CT examination dated July 02, 2022. FINDINGS: Bones/Joint/Cartilage Redemonstration of the hardware removal from prior right hip arthroplasty. Osseous bridging of the acetabular roof. Granulation tissues at the expected location of the femoral prosthetic head. No acute osseous abnormality or significant interval change since recent examination of March 9. Ligaments Suboptimally assessed by CT. Muscles and Tendons Generalized muscle atrophy of the right hip muscles. No intramuscular fluid collection or hematoma. Soft tissues Interval near complete resolution of the large fluid collection in the right hip/thigh with residual fluid and trace amount of subcutaneous gas suggesting resolving infectious/inflammatory process IMPRESSION: 1. Interval near complete resolution of the large fluid collection in the right  hip/thigh with residual fluid and trace amount of subcutaneous gas suggesting resolving infectious/inflammatory process. 2. Redemonstration of the hardware removal from prior right hip arthroplasty. Osseous bridging of the acetabular roof. Granulation tissues  at the expected location of the femoral prosthetic head. No acute osseous abnormality or significant interval change since recent examination. Electronically Signed   By: Keane Police D.O.   On: 07/11/2022 21:40   DG ESOPHAGUS W SINGLE CM (SOL OR THIN BA)  Result Date: 07/11/2022 CLINICAL DATA:  82 year old female with complaint of difficulty swallowing solids and pills. EXAM: ESOPHAGUS/BARIUM SWALLOW/TABLET STUDY TECHNIQUE: Single contrast examination was performed using thin liquid barium. This exam was performed by Brynda Greathouse PA-C, and was supervised and interpreted by Richardean Sale, MD. FLUOROSCOPY: Radiation Exposure Index (as provided by the fluoroscopic device): 17.9 mGy Kerma COMPARISON:  CT ANGIO CHEST 07/02/2022 FINDINGS: Swallowing: Appears normal. No vestibular penetration or aspiration seen. Pharynx: Unremarkable. Esophagus: Tortuous distal esophagus without evidence of high-grade stricture, mass or mucosal ulceration. Esophageal motility: Tertiary contractions noted with retrograde flow and poor progression of barium bolus. Hiatal Hernia: Moderate sized hiatal hernia Gastroesophageal reflux: None visualized. Ingested 62mm barium tablet: Not given; patient refused. Other: Limited exam due to infected right hip, immobility, clinical status. IMPRESSION: 1. Tortuous distal esophagus without evidence of focal mucosal abnormality. 2.  Moderate sized hiatal hernia. 3. Moderate esophageal dysmotility with tertiary contractions and retrograde flow of barium bolus to the upper esophagus. 4.  No limitations above. Electronically Signed   By: Richardean Sale M.D.   On: 07/11/2022 16:23   DG FEMUR MIN 2 VIEWS LEFT  Result Date: 07/11/2022 CLINICAL  DATA:  Left hip pain and cellulitis. EXAM: LEFT FEMUR 2 VIEWS COMPARISON:  Frontal view of the bilateral hips and right hip radiographs 07/02/2022, AP pelvis 12/03/2020 FINDINGS: Moderately decreased bone mineralization. Tiny knee joint effusion. Status post total left knee arthroplasty. No perihardware lucency is seen to indicate hardware failure or loosening on the provided images. The tibial prosthetic component is only incompletely imaged on lateral view. Minimal superomedial left femoroacetabular joint space narrowing. Mild superolateral left acetabular degenerative osteophytosis. No acute fracture is seen. No dislocation. IMPRESSION: 1. Status post total left knee arthroplasty without evidence of hardware failure. 2. Minimal left femoroacetabular osteoarthritis. Electronically Signed   By: Yvonne Kendall M.D.   On: 07/11/2022 16:11   DG CHEST PORT 1 VIEW  Result Date: 07/11/2022 CLINICAL DATA:  Pulmonary edema. EXAM: PORTABLE CHEST 1 VIEW COMPARISON:  07/08/2022 FINDINGS: Prominent lung markings particularly in the left upper chest. Heart size is upper limits of normal but stable. Trachea is midline. Atherosclerotic calcifications at the aortic arch. Slightly improved aeration at the left lung base. Negative for a pneumothorax. No acute bone abnormality. IMPRESSION: 1. Slightly improved aeration at the left lung base. 2. Again noted are prominent lung markings particularly in left upper lung. Findings could be associated with asymmetric interstitial edema. Electronically Signed   By: Markus Daft M.D.   On: 07/11/2022 08:30   ECHOCARDIOGRAM COMPLETE  Result Date: 07/09/2022    ECHOCARDIOGRAM REPORT   Patient Name:   HALIEGH WHISENANT Date of Exam: 07/09/2022 Medical Rec #:  [id number]     Height:       68.0 in Accession #:    [id number]    Weight:       300.0 lb Date of Birth:  1940-11-15     BSA:          2.428 m Patient Age:    26 years      BP:           135/62 mmHg Patient Gender: F  HR:            73 bpm. Exam Location:  Inpatient Procedure: 2D Echo, Cardiac Doppler, Color Doppler, Strain Analysis and            Intracardiac Opacification Agent Indications:    Dyspnea R06.00  History:        Patient has prior history of Echocardiogram examinations, most                 recent 02/24/2020. CHF, CKD III; Risk Factors:Former Smoker and                 Hypertension.  Sonographer:    Wilkie Aye RVT RCS Referring Phys: [id number] [place]  Sonographer Comments: Suboptimal parasternal window, suboptimal apical window, Technically difficult study due to poor echo windows and patient is obese. Image acquisition challenging due to respiratory motion and Image acquisition challenging due to patient body habitus. IMPRESSIONS  1. Left ventricular ejection fraction, by estimation, is 70 to 75%. The left ventricle has hyperdynamic function. The left ventricle has no regional wall motion abnormalities. There is mild left ventricular hypertrophy of the basal-septal segment. Left ventricular diastolic parameters are indeterminate.  2. Right ventricular systolic function is normal. The right ventricular size is normal.  3. The mitral valve is normal in structure. No evidence of mitral valve regurgitation. No evidence of mitral stenosis.  4. The aortic valve is normal in structure. Aortic valve regurgitation is not visualized. No aortic stenosis is present.  5. The inferior vena cava is normal in size with greater than 50% respiratory variability, suggesting right atrial pressure of 3 mmHg. FINDINGS  Left Ventricle: Left ventricular ejection fraction, by estimation, is 70 to 75%. The left ventricle has hyperdynamic function. The left ventricle has no regional wall motion abnormalities. The left ventricular internal cavity size was normal in size. There is mild left ventricular hypertrophy of the basal-septal segment. Left ventricular diastolic parameters are indeterminate. Right Ventricle: The right ventricular size  is normal. No increase in right ventricular wall thickness. Right ventricular systolic function is normal. Left Atrium: Left atrial size was normal in size. Right Atrium: Right atrial size was normal in size. Pericardium: There is no evidence of pericardial effusion. Mitral Valve: The mitral valve is normal in structure. No evidence of mitral valve regurgitation. No evidence of mitral valve stenosis. Tricuspid Valve: The tricuspid valve is normal in structure. Tricuspid valve regurgitation is not demonstrated. No evidence of tricuspid stenosis. Aortic Valve: The aortic valve is normal in structure. Aortic valve regurgitation is not visualized. No aortic stenosis is present. Aortic valve mean gradient measures 9.5 mmHg. Aortic valve peak gradient measures 18.1 mmHg. Aortic valve area, by VTI measures 1.58 cm. Pulmonic Valve: The pulmonic valve was normal in structure. Pulmonic valve regurgitation is not visualized. No evidence of pulmonic stenosis. Aorta: The aortic root is normal in size and structure. Venous: The inferior vena cava is normal in size with greater than 50% respiratory variability, suggesting right atrial pressure of 3 mmHg. IAS/Shunts: No atrial level shunt detected by color flow Doppler.  LEFT VENTRICLE PLAX 2D LVIDd:         4.50 cm   Diastology LVIDs:         2.80 cm   LV e' medial:    6.42 cm/s LV PW:         1.00 cm   LV E/e' medial:  12.3 LV IVS:        1.20 cm  LV e' lateral:   9.79 cm/s LVOT diam:     1.60 cm   LV E/e' lateral: 8.0 LV SV:         62 LV SV Index:   26 LVOT Area:     2.01 cm  RIGHT VENTRICLE             IVC RV Basal diam:  3.90 cm     IVC diam: 1.50 cm RV Mid diam:    3.40 cm RV S prime:     20.30 cm/s TAPSE (M-mode): 2.5 cm LEFT ATRIUM           Index        RIGHT ATRIUM           Index LA diam:      3.20 cm 1.32 cm/m   RA Area:     16.80 cm LA Vol (A2C): 71.7 ml 29.53 ml/m  RA Volume:   41.50 ml  17.09 ml/m LA Vol (A4C): 64.6 ml 26.61 ml/m  AORTIC VALVE AV Area  (Vmax):    1.39 cm AV Area (Vmean):   1.37 cm AV Area (VTI):     1.58 cm AV Vmax:           213.00 cm/s AV Vmean:          140.500 cm/s AV VTI:            0.392 m AV Peak Grad:      18.1 mmHg AV Mean Grad:      9.5 mmHg LVOT Vmax:         147.00 cm/s LVOT Vmean:        95.700 cm/s LVOT VTI:          0.308 m LVOT/AV VTI ratio: 0.79  AORTA Ao Root diam: 2.20 cm Ao Asc diam:  3.00 cm MITRAL VALVE               TRICUSPID VALVE MV Area (PHT): 2.80 cm    TR Peak grad:   18.3 mmHg MV Decel Time: 271 msec    TR Vmax:        214.00 cm/s MV E velocity: 78.80 cm/s MV A velocity: 55.80 cm/s  SHUNTS MV E/A ratio:  1.41        Systemic VTI:  0.31 m                            Systemic Diam: 1.60 cm Candee Furbish MD Electronically signed by Candee Furbish MD Signature Date/Time: 07/09/2022/12:27:40 PM    Final    DG Chest Portable 1 View  Result Date: 07/08/2022 CLINICAL DATA:  Short of breath, pneumonia EXAM: PORTABLE CHEST 1 VIEW COMPARISON:  07/06/2022 FINDINGS: Single frontal view of the chest demonstrates stable enlargement of the cardiac silhouette. Multifocal bilateral airspace disease greatest in the right upper and left lower lung zones. Small left effusion is suspected. Continued central vascular congestion. No pneumothorax. No acute bony abnormality. IMPRESSION: 1. Constellation of findings most suggestive of congestive heart failure, with no significant change in volume status since prior study. Underlying pneumonia would be difficult to exclude. Electronically Signed   By: Randa Ngo M.D.   On: 07/08/2022 17:36   DG CHEST PORT 1 VIEW  Result Date: 07/06/2022 CLINICAL DATA:  Shortness of breath. EXAM: PORTABLE CHEST 1 VIEW COMPARISON:  None of 07/04/2022 FINDINGS: Stable cardiomediastinal contours. Lung volumes are low. Mild scratch set increase interstitial markings  are identified bilaterally concerning for pulmonary edema. Cannot exclude small pleural effusions. Decreased aeration to the left base may  represent atelectasis or airspace disease. IMPRESSION: 1. Suspect mild congestive heart failure. 2. Decreased aeration to the left base may represent atelectasis or airspace disease. Electronically Signed   By: Kerby Moors M.D.   On: 07/06/2022 08:05   DG CHEST PORT 1 VIEW  Result Date: 07/04/2022 CLINICAL DATA:  Shortness of breath. EXAM: PORTABLE CHEST 1 VIEW COMPARISON:  07/03/2018 for FINDINGS: Stable cardiomediastinal contours. Low lung volumes. Unchanged left base atelectasis. No signs of pleural effusion, interstitial edema or consolidation. IMPRESSION: No change in aeration to the lungs compared with previous exam. Electronically Signed   By: Kerby Moors M.D.   On: 07/04/2022 08:18   DG CHEST PORT 1 VIEW  Result Date: 07/03/2022 CLINICAL DATA:  Shortness of breath. EXAM: PORTABLE CHEST 1 VIEW COMPARISON:  07/02/2022 and prior studies FINDINGS: This is a low volume study. The cardiomediastinal silhouette is unchanged. Mild LEFT basilar opacities are noted. There is no evidence of pneumothorax or pleural effusion. No acute bony abnormalities are noted. IMPRESSION: Mild LEFT basilar opacities, favor atelectasis but infection/pneumonia is not excluded. Electronically Signed   By: Margarette Canada M.D.   On: 07/03/2022 16:30   CT Hip Right Wo Contrast  Result Date: 07/02/2022 CLINICAL DATA:  Septic arthritis suspected. Hip x-ray done. Hip pain and redness in the right hip. EXAM: CT OF THE RIGHT HIP WITHOUT CONTRAST TECHNIQUE: Multidetector CT imaging of the right hip was performed according to the standard protocol. Multiplanar CT image reconstructions were also generated. RADIATION DOSE REDUCTION: This exam was performed according to the departmental dose-optimization program which includes automated exposure control, adjustment of the mA and/or kV according to patient size and/or use of iterative reconstruction technique. COMPARISON:  Hip radiographs earlier today and CT right hip 06/08/2021  FINDINGS: Bones/Joint/Cartilage Redemonstrated changes from prior right hip arthroplasty with subsequent removal. Increased callus formation about the acetabulum with redemonstrated fragmentation along the medial aspect of the acetabulum. Unchanged fragmentation of the right greater trochanter. MR nondisplaced longitudinal fracture and osseous fragmentation about the proximal femoral diaphysis. The proximal end of the femoral diaphysis has shifted anteriorly compared to 06/08/2021. Unchanged mixed soft tissue and fluid components at the site of the prior femoral head and neck component compatible with granulation tissue. There may be a central fluid component/seroma though this is similar to decreased from 06/08/2021. Ligaments Suboptimally assessed by CT. Muscles and Tendons Muscle atrophy of the gluteal muscles. Soft tissues Similar postsurgical scarring in the subcutaneous fat anterior to the right hip. Increased size of the thick-walled fluid collection in the lateral right subcutaneous fat extending medially to the greater trochanter fragmentation. This is not completely visualized on this exam and extends laterally beyond the lateral aspect of the scan. Suggestion of adjacent stranding lateral to the fluid collection though this is largely excluded from the field-of-view. IMPRESSION: 1. 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 There is some interval healing change since 06/08/2021. 2. Unchanged granulation tissue and possible central seroma about the proximal end of the femoral diaphysis and acetabulum. 3. Increased size of the thick-walled fluid collection in the lateral right subcutaneous fat extending medially to the greater trochanter fragmentation. This is not completely visualized on this exam and extends laterally beyond the field-of-view. There is the suggestion of subcutaneous stranding lateral to the fluid collection. Clinical correlation is recommended to  exclude cellulitis. If there is concern for  abscess, consider further evaluation with ultrasound and/or fluid sampling. It is unclear if the large subcutaneous fluid collection communicates with the joint space. Electronically Signed   By: Placido Sou M.D.   On: 07/02/2022 23:43   DG Hip Unilat With Pelvis 2-3 Views Right  Result Date: 07/02/2022 CLINICAL DATA:  Hip pain with redness and swelling to the right hip EXAM: DG HIP (WITH OR WITHOUT PELVIS) 2-3V RIGHT COMPARISON:  CT of the right hip 06/08/2021 FINDINGS: Findings appear similar to CT 06/08/2021 given differences in technique. Changes from prior right hip arthroplasty with subsequent hardware removal. Fragmentation of the right greater trochanter. Cortical thickening and sclerosis about the proximal femoral shaft and acetabulum. No definite acute fracture. The soft tissue thickening and fluid about the right hip was better demonstrated on CT. Contrast within the bladder IMPRESSION: No definite acute fracture. Similar changes about the right hip from right hip arthroplasty and subsequent heart were removal when compared with CT 06/08/2021. Electronically Signed   By: Placido Sou M.D.   On: 07/02/2022 22:01   CT Angio Chest PE W/Cm &/Or Wo Cm  Result Date: 07/02/2022 CLINICAL DATA:  Pulmonary embolism suspected.  High probability. EXAM: CT ANGIOGRAPHY CHEST WITH CONTRAST TECHNIQUE: Multidetector CT imaging of the chest was performed using the standard protocol during bolus administration of intravenous contrast. Multiplanar CT image reconstructions and MIPs were obtained to evaluate the vascular anatomy. RADIATION DOSE REDUCTION: This exam was performed according to the departmental dose-optimization program which includes automated exposure control, adjustment of the mA and/or kV according to patient size and/or use of iterative reconstruction technique. CONTRAST:  65mL OMNIPAQUE IOHEXOL 350 MG/ML SOLN COMPARISON:  Portable chest today,  portable chest 10/24/2020. FINDINGS: Cardiovascular: There is mild cardiomegaly and small pericardial effusion. There are scattered three-vessel coronary artery calcifications. The pulmonary veins are normal caliber. The pulmonary arteries are normal caliber and clear through the segmental divisions. Due to breathing motion, the subsegmental arterial bed is obscured and not evaluated. There is mild aortic atherosclerosis and tortuosity without aneurysm, stenosis or dissection. The great vessels are clear. Mediastinum/Nodes: There is a moderate-sized hiatal hernia with gastroesophageal reflux to the level of the aortic arch. The esophageal thickness is normal. The trachea and main bronchi are patent. There are minimal retained secretions in the right main bronchus. There is no intrathoracic or axillary adenopathy. Axillary spaces are clear. Normal thyroid. Lungs/Pleura: There small symmetric bilateral layering pleural effusions and adjacent coarse atelectatic markings in the bilateral lower lobes. There is mild posterior atelectasis in the upper lung fields. Reticulated scarring noted both apices. No focal infiltrate or nodule is seen through the breathing motion. There is no pulmonary edema. Upper Abdomen: Status post cholecystectomy. No biliary dilatation. Moderate hepatic steatosis. No acute upper abdominal findings. Musculoskeletal: There is thoracic kyphosis, osteopenia, spondylosis and multilevel degenerative discs, multilevel bridging enthesopathy. Bilateral acromiohumeral abutment consistent with chronic rotator cuff arthropathy and degenerative tears is also noted with bilateral supraspinatus fatty atrophy. No acute or other significant musculoskeletal findings. Review of the MIP images confirms the above findings. IMPRESSION: 1. No evidence of arterial dilatation through the segmental divisions. The subsegmental arterial bed is obscured by breathing motion. 2. Cardiomegaly with small pericardial effusion  and three-vessel coronary atherosclerosis. 3. Small bilateral pleural effusions with adjacent atelectasis in the lower lobes. 4. Moderate-sized hiatal hernia with gastroesophageal reflux to the level of the aortic arch. Aspiration precautions may be indicated. 5. Aortic atherosclerosis. 6. Moderate hepatic steatosis. 7. Osteopenia, kyphosis and degenerative change.  8. 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 Aortic Atherosclerosis (ICD10-I70.0). Electronically Signed   By: Telford Nab M.D.   On: 07/02/2022 21:32   DG Chest Port 1 View  Result Date: 07/02/2022 CLINICAL DATA:  Sternal chest pain, worse with movement EXAM: PORTABLE CHEST 1 VIEW COMPARISON:  Chest radiograph dated 10/24/2020 FINDINGS: Low lung volumes with bronchovascular crowding. Bibasilar and bilateral perihilar patchy opacities. 0w0 No pleural effusion or pneumothorax. The heart size and mediastinal contours are within normal limits. The visualized skeletal structures are unremarkable. IMPRESSION: 1. Low lung volumes with bronchovascular crowding. 2. Bibasilar and bilateral perihilar patchy opacities, which could be due to atelectasis or infection. Electronically Signed   By: Darrin Nipper M.D.   On: 07/02/2022 19:08    Assessment/Plan: Vaginal bleeding No pain, itching, or discharge, bright red blood noted on adult depend and digital exam, will try Estrace vaginal suppository MWF, update CBC/diff, CMP/eGFR in am, may GYN referral if persists.   Acute on chronic diastolic CHF (congestive heart failure) (HCC) not apparent, takes Furosemide, Spironolactone, well diuresed, no O2 desaturation.   Cutaneous abscess of right hip Drain with silver fiber, continue f/u ID, Cipro, Linezolid, redness, warmth, tenderness persisted.   GERD (gastroesophageal reflux disease) Will switch to Pantoprazole 40mg  qd, observe.   Blood loss anemia post op, s/p EGD no active bleed. S/p 6 u PRBC  transfusion, ASA was dc'd, On Fe, Iron 15 in hospital, Hgb 11.9 07/13/22  Hyponatremia Na 132 07/14/22     Family/ staff Communication: plan of care reviewed with the patient and charge nurse.   Labs/tests ordered:  CBC/diff, CMP/eGFR  Time spend 35 minutes.

## 2022-07-21 ENCOUNTER — Encounter: Payer: Self-pay | Admitting: Nurse Practitioner

## 2022-07-21 DIAGNOSIS — L02415 Cutaneous abscess of right lower limb: Secondary | ICD-10-CM | POA: Insufficient documentation

## 2022-07-21 DIAGNOSIS — E871 Hypo-osmolality and hyponatremia: Secondary | ICD-10-CM | POA: Insufficient documentation

## 2022-07-21 LAB — CBC: RBC: 4.36 (ref 3.87–5.11)

## 2022-07-21 LAB — CBC AND DIFFERENTIAL
HCT: 36 (ref 36–46)
Hemoglobin: 11.7 — AB (ref 12.0–16.0)
Neutrophils Absolute: 4230
Platelets: 221 10*3/uL (ref 150–400)
WBC: 6.4

## 2022-07-21 LAB — BASIC METABOLIC PANEL WITH GFR
BUN: 19 (ref 4–21)
CO2: 23 — AB (ref 13–22)
Chloride: 101 (ref 99–108)
Creatinine: 1.3 — AB (ref 0.5–1.1)
Glucose: 82
Potassium: 3.7 meq/L (ref 3.5–5.1)
Sodium: 132 — AB (ref 137–147)

## 2022-07-21 LAB — COMPREHENSIVE METABOLIC PANEL
Albumin: 3 — AB (ref 3.5–5.0)
Calcium: 8.3 — AB (ref 8.7–10.7)
Globulin: 3.1
eGFR: 43

## 2022-07-21 LAB — HEPATIC FUNCTION PANEL
ALT: 48 U/L — AB (ref 7–35)
AST: 39 — AB (ref 13–35)
Alkaline Phosphatase: 64 (ref 25–125)

## 2022-07-21 NOTE — Assessment & Plan Note (Signed)
post op, s/p EGD no active bleed. S/p 6 u PRBC transfusion, ASA was dc'd, On Fe, Iron 15 in hospital, Hgb 11.9 07/13/22

## 2022-07-21 NOTE — Assessment & Plan Note (Signed)
not apparent, takes Furosemide, Spironolactone, well diuresed, no O2 desaturation.

## 2022-07-21 NOTE — Assessment & Plan Note (Signed)
Will switch to Pantoprazole 40mg  qd, observe.

## 2022-07-21 NOTE — Assessment & Plan Note (Signed)
Drain with silver fiber, continue f/u ID, Cipro, Linezolid, redness, warmth, tenderness persisted.

## 2022-07-21 NOTE — Assessment & Plan Note (Signed)
Na 132 07/14/22

## 2022-07-26 ENCOUNTER — Ambulatory Visit (INDEPENDENT_AMBULATORY_CARE_PROVIDER_SITE_OTHER): Payer: Medicare Other | Admitting: Infectious Diseases

## 2022-07-26 ENCOUNTER — Encounter: Payer: Self-pay | Admitting: Infectious Diseases

## 2022-07-26 ENCOUNTER — Other Ambulatory Visit: Payer: Self-pay

## 2022-07-26 VITALS — BP 109/69 | HR 90 | Temp 96.8°F | Resp 16 | Ht 68.0 in | Wt 291.0 lb

## 2022-07-26 DIAGNOSIS — L03115 Cellulitis of right lower limb: Secondary | ICD-10-CM | POA: Diagnosis not present

## 2022-07-26 DIAGNOSIS — L03116 Cellulitis of left lower limb: Secondary | ICD-10-CM

## 2022-07-26 DIAGNOSIS — Z79899 Other long term (current) drug therapy: Secondary | ICD-10-CM | POA: Diagnosis not present

## 2022-07-26 NOTE — Progress Notes (Unsigned)
Patient Active Problem List   Diagnosis Date Noted   Cutaneous abscess of right hip 07/21/2022   Hyponatremia 07/21/2022   Vaginal bleeding 07/20/2022   Multifocal pneumonia 07/08/2022   Leukocytosis 07/08/2022   Acute on chronic diastolic CHF (congestive heart failure) 07/08/2022   Chronic anemia 07/08/2022   Infected abrasion of right hip, initial encounter 07/03/2022   Cellulitis of right hip 07/03/2022   Atypical chest pain 07/03/2022   Chronic diastolic CHF (congestive heart failure) 07/03/2022   Weight gain 06/27/2022   Malaise 06/27/2022   COVID-19 virus infection 05/16/2022   Ingrown left greater toenail 04/12/2022   Dysuria 04/12/2022   Absence of hip joint, 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   Gait disorder 01/27/2022   Pressure ulcer, stage 2 07/12/2021   Candidiasis of skin 04/13/2021   UTI (urinary tract infection) 04/02/2021   Hypokalemia 02/26/2021   Pressure ulcer of thigh, stage 2 02/19/2021   Elevated liver enzymes 02/16/2021   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   Right hip pain 02/05/2021   Sepsis 02/05/2021   Blood loss anemia 02/05/2021   Rheumatoid arthritis 02/05/2021   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   Acquired hypothyroidism 02/05/2021   Drug rash 02/05/2021   Stage 3a chronic kidney disease (CKD) 01/30/2019   Astigmatism with presbyopia, bilateral 01/22/2019   Blepharitis of both upper and lower eyelid 01/22/2019   Dermatochalasis of both upper eyelids 01/22/2019   Dry eyes, bilateral 01/22/2019   Epiretinal membrane (ERM) of both eyes 01/22/2019   Meibomian gland dysfunction (MGD) of both eyes 01/22/2019   Pseudophakia of both eyes 01/22/2019   Vitreous syneresis of both eyes 01/22/2019   Acute cystitis without hematuria 11/01/2018   E-coli UTI 06/28/2018   Malodorous urine 06/28/2018   Class 3 obesity 09/25/2017   Obstructive sleep apnea    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   Metabolic bone disease [id number]   Vitamin D  deficiency 09/01/2015   Chronic diarrhea 07/27/2015   Edema of lower extremity 11/11/2014   Cardiac disease 03/10/2014   Heart disease 03/10/2014   Encounter for preprocedural cardiovascular examination    Essential hypertension    GERD (gastroesophageal reflux disease)    Adiposity    Supraventricular tachycardia (HCC)    Decreased cardiac output    Urinary incontinence     Patient's Medications  New Prescriptions   No medications on file  Previous Medications   ACETAMINOPHEN (TYLENOL) 325 MG TABLET    Take 650 mg by mouth every 6 (six) hours as needed for moderate pain.   ALBUTEROL (VENTOLIN HFA) 108 (90 BASE) MCG/ACT INHALER    Inhale 2 puffs into the lungs every 6 (six) hours as needed for wheezing or shortness of breath.   BIFIDOBACTERIUM INFANTIS (ALIGN) CAPSULE    Take 1 capsule by mouth daily.   CALCIUM CARB-CHOLECALCIFEROL (CALCIUM PLUS VITAMIN D3 PO)    Take 1 tablet by mouth daily. Vitamin D3 64mcg + Calcium 600   CHOLECALCIFEROL (VITAMIN D3) 50 MCG (2000 UT) TABS    Take 2 tablets by mouth daily.   CIPROFLOXACIN (CIPRO) 750 MG TABLET    Take 1 tablet (750 mg total) by mouth 2 (two) times daily for 14 days.   FERROUS SULFATE 325 (65 FE) MG EC TABLET    Take 325 mg by mouth as directed. Once A Day on Mon, Thu   FUROSEMIDE (LASIX) 40 MG TABLET    Take 40 mg by mouth 2 (two) times daily.   GABAPENTIN (  NEURONTIN) 300 MG CAPSULE    Take 300 mg by mouth at bedtime.   IPRATROPIUM-ALBUTEROL (DUONEB) 0.5-2.5 (3) MG/3ML SOLN    Inhale 3 mLs into the lungs every 8 (eight) hours as needed (wheezing).   LEVOTHYROXINE (SYNTHROID) 25 MCG TABLET    Take 50 mcg by mouth as directed. 2 tablets (50 mcg) one time a day every 2 day(s)   LINEZOLID (ZYVOX) 600 MG TABLET    Take 1 tablet (600 mg total) by mouth every 12 (twelve) hours for 14 days.   LOPERAMIDE (IMODIUM) 2 MG CAPSULE    Take 2 mg by mouth every 4 (four) hours as needed for diarrhea or loose stools.   MINERAL OIL-HYDROPHILIC  PETROLATUM (AQUAPHOR) OINTMENT    Apply 1 Application topically in the morning and at bedtime. Apply to (L) arm &Chest topically two times a day for Itching;Rash related to ALLERGIC CONTACT DERMATITIS DU   MIRTAZAPINE (REMERON) 7.5 MG TABLET    Take 7.5 mg by mouth at bedtime.   NYSTATIN (MYCOSTATIN/NYSTOP) POWDER    Apply 1 application  topically in the morning, at noon, in the evening, and at bedtime. To genital areas and inner thigh   OMEPRAZOLE (PRILOSEC) 20 MG CAPSULE    Take 20 mg by mouth daily.   ONDANSETRON (ZOFRAN-ODT) 4 MG DISINTEGRATING TABLET    Take 4 mg by mouth every 8 (eight) hours as needed for nausea or vomiting.   POTASSIUM CHLORIDE SA (KLOR-CON) 20 MEQ TABLET    Take 20 mEq by mouth daily.   SEMAGLUTIDE (OZEMPIC, 0.25 OR 0.5 MG/DOSE, [place])    Inject 0.25 mg into the skin once a week.   SPIRONOLACTONE (ALDACTONE) 25 MG TABLET    Take 1 tablet (25 mg total) by mouth daily.  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 Discharged on 3/21 with plan to complete 2 weeks course of ciprofloxacin and linezolid. EOT 07/26/22  07/27/22 Accompanied by staff from the facility as well as her husband. Denies any concerns at the bilateral hip and thigh regions and taking antibiotics as instructed per [facility]. But husband was  concerned that she was struggling with nausea and vomiting and they had to given give her antiemetics. Denies abdominal pain, diarrhea, fevers and chills. Left thigh cellulitis has completely resolved. Cellulitis in the rt hip/thigh region has also improved but still has small opening.   Review of Systems: all systems reviewed with pertinent positives and negatives as listed above  Past Medical History:  Diagnosis Date   Benign hypertension with chronic kidney disease, stage III    Overview:  Last Assessment & Plan:  Usually the patient is hypertensive. However today her blood pressure is [id number] systolic. She has been feeling fatigued with some lightheadedness. Part of this may be related to her lower blood pressure. Her pressure may be  improved with her decrease in salt and fluid intake. I've instructed her to put her lisinopril/hctz on hold. 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 It was possibly reentrant. She does well with diltiazem. No change in therapy.   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   Thyroid disease    Urinary incontinence    Past Surgical History:   Procedure Laterality Date   CATARACT EXTRACTION Left 8/11   CATARACT EXTRACTION Right 3/12   CHOLECYSTECTOMY  1994   Copsilotomy Laser Treatment Left 6/12   eye   ELBOW SURGERY  8/12   EYE SURGERY Left 09/2008   macular hole   EYE SURGERY Right 12/11   Lumbar Infusion  2011   MOLE REMOVAL  06/17/2015   TOTAL KNEE ARTHROPLASTY Left 6/11   TOTAL KNEE ARTHROPLASTY Right 06/13/2011    Social History   Tobacco Use   Smoking status: Former    Passive exposure: Never   Smokeless tobacco: Never  Vaping Use   Vaping Use: Never used  Substance Use Topics   Alcohol use: No   Drug use: No    Family History  Problem Relation Age of Onset   High blood pressure Mother    Diabetes Mother    Heart Problems Father    Diabetes Father    Prostate cancer Father    Heart attack Father    Heart attack Paternal Grandmother    Heart attack Maternal Grandfather    Heart attack Paternal Uncle    Stroke Neg Hx     Allergies  Allergen Reactions   Keflex [Cephalexin] Diarrhea   Macrobid [Nitrofurantoin] Hives   Amoxicillin Rash    Health Maintenance  Topic Date Due   COVID-19 Vaccine (8 - 2023-24 season) 08/11/2022 (Originally 04/21/2022)   Medicare Annual Wellness (AWV)  11/12/2022   INFLUENZA VACCINE  11/24/2022   DTaP/Tdap/Td (5 - Td or Tdap) 11/25/2030   Pneumonia Vaccine 32+ Years old  Completed   DEXA SCAN  Completed   Zoster Vaccines- Shingrix  Completed   HPV VACCINES  Aged Out    Objective:  Vitals:   07/26/22 0931  BP: 109/69  Pulse: 90  Resp: 16  Temp: (!) 96.8 F (36 C)  TempSrc: Temporal  Weight: 291 lb (132 kg)  Height: 5\' 8"  (1.727 m)   Body mass index is 44.25 kg/m.  Physical Exam Constitutional:      Appearance: Normal appearance. Obese  HENT:     Head: Normocephalic and atraumatic.      Mouth: Mucous membranes are moist.  Eyes:    Conjunctiva/sclera: Conjunctivae normal.  Pupils: Pupils are equal, round, and bilaterally symmetrical    Cardiovascular:     Rate and Rhythm: Normal rate and regular rhythm.     Heart sounds:   Pulmonary:     Effort: Pulmonary effort is normal.     Breath sounds:   Abdominal:     General: Non distended     Palpations: soft.   Musculoskeletal:        General: She is lying down in a stretcher. Exam limited as she is non weight bearing.  Rt hip/thigh cellulitis has resolved - no surrounding cellulitis but has some induration, no fluctuance, small opening in the rt thigh with no active drainage. Left thigh cellulitis has resolved  RT thigh    Skin:    General: Skin is warm and dry.     Comments:  Neurological:     General: grossly non focal     Mental Status: awake, alert and oriented to person, place, and time.   Psychiatric:        Mood and Affect: Mood normal.   Lab Results Lab Results  Component Value Date   WBC 9.5 07/13/2022   HGB 11.9 (L) 07/13/2022   HCT 39.9 07/13/2022   MCV 89.7 07/13/2022   PLT 466 (H) 07/13/2022    Lab Results  Component Value Date   CREATININE 1.41 (H) 07/14/2022   BUN 21 07/14/2022   NA 132 (L) 07/14/2022   K 3.3 (L) 07/14/2022   CL 99 07/14/2022   CO2 22 07/14/2022    Lab Results  Component Value Date   ALT 25 07/06/2022   AST 36 07/06/2022   ALKPHOS 63 07/06/2022   BILITOT 0.3 07/06/2022    No results found for: "CHOL", "HDL", "LDLCALC", "LDLDIRECT", "TRIG", "CHOLHDL" No results found for: "LABRPR", "RPRTITER" No results found for: "HIV1RNAQUANT", "HIV1RNAVL", "CD4TABS"  Assessment/Plan # Rt hip cellulitis with an open wound, resolved  CT rt hip with near complete resolution of the large fluid collection in the right hip/thigh with residual fluid and trace amount of subcutaneous gas suggesting resolving infectious/inflammatory process.  Complete linezolid and ciprofloxacin course, EOT 07/26/22 Needs good wound care and optimization of fluid status    # Left thigh cellulitis - improved  # Medication management  - no labs  as stopping abtx     I have personally spent 43  minutes involved in face-to-face and non-face-to-face activities for this patient on the day of the visit. Professional time spent includes the following activities: Preparing to see the patient (review of tests), Obtaining and/or reviewing separately obtained history (admission/discharge record), Performing a medically appropriate examination and/or evaluation , Ordering medications/tests/procedures, referring and communicating with other health care professionals, Documenting clinical information in the EMR, Independently interpreting results (not separately reported), Communicating results to the patient/family/caregiver, Counseling and educating the patient/family/caregiver and Care coordination (not separately reported).   Wilber Oliphant, [facility] 07/26/2022, 9:57 AM

## 2022-07-27 DIAGNOSIS — Z79899 Other long term (current) drug therapy: Secondary | ICD-10-CM | POA: Insufficient documentation

## 2022-07-27 DIAGNOSIS — L03116 Cellulitis of left lower limb: Secondary | ICD-10-CM | POA: Insufficient documentation

## 2022-08-02 ENCOUNTER — Non-Acute Institutional Stay (SKILLED_NURSING_FACILITY): Payer: Medicare Other | Admitting: Family Medicine

## 2022-08-02 ENCOUNTER — Encounter: Payer: Self-pay | Admitting: Family Medicine

## 2022-08-02 DIAGNOSIS — L03115 Cellulitis of right lower limb: Secondary | ICD-10-CM | POA: Diagnosis not present

## 2022-08-02 DIAGNOSIS — L89202 Pressure ulcer of unspecified hip, stage 2: Secondary | ICD-10-CM

## 2022-08-02 DIAGNOSIS — Z89621 Acquired absence of right hip joint: Secondary | ICD-10-CM | POA: Diagnosis not present

## 2022-08-02 DIAGNOSIS — I10 Essential (primary) hypertension: Secondary | ICD-10-CM

## 2022-08-02 DIAGNOSIS — N939 Abnormal uterine and vaginal bleeding, unspecified: Secondary | ICD-10-CM

## 2022-08-02 NOTE — Progress Notes (Signed)
Location:  [facility] Room Number: 56-A Place of Service:  SNF [id number]) Provider:  Bertram Millard. Earlie Raveling, Jimmye Norman, MD  Patient Care Team: Heffner, Jimmye Norman, MD as PCP - General (Family Medicine) [name], MD as PCP - Cardiology (Cardiology) [name], MD as PCP - Sleep Medicine (Cardiology) [name], MD (Sports Medicine) [name], MD (Ophthalmology) [name], [name], MD (Dermatology) [name], MD (Gastroenterology) [name], MD (Nephrology) [name], DC as Referring Physician (Chiropractic Medicine)  Extended Emergency Contact Information Primary Emergency Contact: [facility] Address: [address]          [place], [place] [address] [name] of [place] Home Phone: [phone] Mobile Phone: [phone] Relation: Spouse  Code Status:  DNR Goals of care: Advanced Directive information    08/02/2022    9:26 AM  Advanced Directives  Does Patient Have a Medical Advance Directive? Yes  Type of Advance Directive Living will;Out of facility DNR (pink MOST or yellow form)  Does patient want to make changes to medical advance directive? No - Patient declined     No chief complaint on file.   HPI:  Pt is a 82 y.o. female seen today for medical management of chronic diseases.:  Congestive heart failure, cellulitis right hip, gait disorder, chronic kidney disease stage IIIa, class III obesity, hypertension.  Patient had 2 hospital admissions last month first for right hip cellulitis and sepsis, and following that admission for pneumonia or congestive heart failure.  This was treated with IV Lasix. She has had follow-up appointment last week with infectious disease who discontinued her antibiotics for right hip cellulitis.  She continues to have some drainage around the packing in the right hip area. She denies any new symptoms or problems today.  Occasionally has some  nights where she does not sleep well.  No new pain.  Appetite continues good.  It is concerning that she has not been out of her room in her electric scooter since hospitalization.   Past Medical History:  Diagnosis Date   Benign hypertension with chronic kidney disease, stage III    Overview:  Last Assessment & Plan:  Usually the patient is hypertensive. However today her blood pressure is 105 systolic. She has been feeling fatigued with some lightheadedness. Part of this may be related to her lower blood pressure. Her pressure may be improved with her decrease in salt and fluid intake. I've instructed her to put her lisinopril/hctz on hold. 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 It was possibly reentrant. She does well with diltiazem. No change in therapy.  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   Thyroid disease    Urinary incontinence    Past Surgical History:  Procedure Laterality Date   CATARACT EXTRACTION Left 8/11   CATARACT EXTRACTION Right 3/12   CHOLECYSTECTOMY  1994   Copsilotomy Laser Treatment Left 6/12   eye   ELBOW SURGERY  8/12   EYE  SURGERY Left 09/2008   macular hole   EYE SURGERY Right 12/11   Lumbar Infusion  2011   MOLE REMOVAL  06/17/2015   TOTAL KNEE ARTHROPLASTY Left 6/11   TOTAL KNEE ARTHROPLASTY Right 06/13/2011    Allergies  Allergen Reactions   Keflex [Cephalexin] Diarrhea   Macrobid [Nitrofurantoin] Hives   Amoxicillin Rash    Outpatient Encounter Medications as of 08/02/2022  Medication Sig   acetaminophen (TYLENOL) 325 MG tablet Take 650 mg by mouth every 6 (six) hours as needed for moderate pain.   albuterol (VENTOLIN HFA) 108 (90 Base) MCG/ACT inhaler Inhale 2 puffs into the lungs every 6 (six) hours as needed for wheezing or shortness of breath.   bifidobacterium infantis (ALIGN) capsule Take 1 capsule by mouth daily.   Calcium Carb-Cholecalciferol (CALCIUM PLUS VITAMIN D3 PO) Take 1 tablet by mouth daily. Vitamin D3 + Calcium 600   Cholecalciferol (VITAMIN D3) 50 MCG (2000 UT) TABS Take 2 tablets by mouth daily.   estradiol (ESTRACE VAGINAL) 0.1 MG/GM vaginal cream Place 1 Applicatorful vaginally 3 (three) times a week.   ferrous sulfate 325 (65 FE) MG EC tablet Take 325 mg by mouth as directed. Once A Day on Mon, Thu   furosemide (LASIX) 40 MG tablet Take 40 mg by mouth 2 (two) times daily.   gabapentin (NEURONTIN) 300 MG capsule Take 300 mg by mouth at bedtime.   ipratropium-albuterol (DUONEB) 0.5-2.5 (3) MG/3ML SOLN Inhale 3 mLs into the lungs every 8 (eight) hours as needed (wheezing).   levothyroxine (SYNTHROID) 25 MCG tablet Give 1 tablet orally one time a day every 2 day(s); 2 tablets (50 mcg) one time a day every 2 day(s)   loperamide (IMODIUM) 2 MG capsule Take 2 mg by mouth every 4 (four) hours as needed for diarrhea or loose stools.   mineral oil-hydrophilic petrolatum (AQUAPHOR) ointment Apply 1 Application topically in the morning and at bedtime. Apply to (L) arm &Chest topically two times a day for Itching;Rash related to ALLERGIC CONTACT DERMATITIS DU   nystatin  (MYCOSTATIN/NYSTOP) powder Apply 1 application  topically in the morning, at noon, in the evening, and at bedtime. To genital areas and inner thigh   ondansetron (ZOFRAN-ODT) 4 MG disintegrating tablet Take 4 mg by mouth every 8 (eight) hours as needed for nausea or vomiting.   pantoprazole (PROTONIX) 40 MG tablet Take 40 mg by mouth daily.   potassium chloride SA (KLOR-CON) 20 MEQ tablet Take 20 mEq by mouth daily.   spironolactone (ALDACTONE) 25 MG tablet Take 1 tablet (25 mg total) by mouth daily.   [DISCONTINUED] mirtazapine (REMERON) 7.5 MG tablet Take 7.5 mg by mouth at bedtime.   [DISCONTINUED] omeprazole (PRILOSEC) 20 MG capsule Take 20 mg by mouth daily.   [DISCONTINUED] Semaglutide (OZEMPIC, 0.25 OR 0.5 MG/DOSE, [place]) Inject 0.25 mg into the skin once a week.   No facility-administered encounter medications on file as of 08/02/2022.    Review of Systems  Constitutional:  Positive for activity change.  HENT: Negative.    Respiratory: Negative.    Cardiovascular: Negative.  Gastrointestinal: Negative.   Genitourinary: Negative.   Musculoskeletal:  Positive for gait problem.  Psychiatric/Behavioral: Negative.    All other systems reviewed and are negative.   Immunization History  Administered Date(s) Administered   Influenza Split 01/28/2008, 02/17/2009, 02/20/2012, 02/11/2013, 02/02/2016, 01/11/2017, 02/04/2019   Influenza, High Dose Seasonal PF 02/04/2015, 01/11/2017, 01/30/2018, 02/05/2020   Influenza-Unspecified 02/16/2022   Moderna SARS-COV2 Booster Vaccination 02/24/2022   Moderna Sars-Covid-2 Vaccination 05/09/2019, 05/27/2019, 03/03/2020, 09/04/2020, 11/04/2020   Pfizer Covid-19 Vaccine Bivalent Booster 544yrs & up 02/09/2021   Pneumococcal Conjugate-13 08/30/2013   Pneumococcal Polysaccharide-23 11/03/2005   RSV,unspecified 04/22/2022   Td 08/01/2001, 09/05/2019   Tdap 09/22/2010, 11/24/2020   Zoster Recombinat (Shingrix) 11/08/2016, 01/11/2017   Zoster, Live  11/04/2005, 11/08/2016, 01/11/2017   Zoster, Unspecified 01/11/2017   Pertinent  Health Maintenance Due  Topic Date Due   INFLUENZA VACCINE  11/24/2022   DEXA SCAN  Completed      10/24/2020   11:31 AM 04/15/2022   10:15 AM 04/27/2022   11:35 AM 05/31/2022    9:34 AM 07/26/2022    9:31 AM  Fall Risk  Falls in the past year?  0 0 0 0  Was there an injury with Fall?  0 0 0 0  Fall Risk Category Calculator  0 0 0 0  Fall Risk Category (Retired)  Low Low    (RETIRED) Patient Fall Risk Level Moderate fall risk Moderate fall risk Moderate fall risk    Patient at Risk for Falls Due to  History of fall(s) History of fall(s) History of fall(s)   Fall risk Follow up  Falls evaluation completed Falls evaluation completed Falls evaluation completed    Functional Status Survey:    Vitals:   08/02/22 0913  BP: (!) 156/80  Pulse: 80  Resp: 16  Temp: 98.2 F (36.8 C)  SpO2: 94%  Weight: 291 lb (132 kg)  Height: 5\' 8"  (1.727 m)   Body mass index is 44.25 kg/m. Physical Exam Vitals and nursing note reviewed.  Constitutional:      Appearance: Normal appearance.  Cardiovascular:     Rate and Rhythm: Normal rate and regular rhythm.  Pulmonary:     Effort: Pulmonary effort is normal.     Breath sounds: Normal breath sounds.  Abdominal:     General: Bowel sounds are normal.     Palpations: Abdomen is soft.  Musculoskeletal:     Comments: Packing in place with drainage right lateral hip  Skin:    General: Skin is warm and dry.  Neurological:     General: No focal deficit present.     Mental Status: She is alert and oriented to person, place, and time.  Psychiatric:        Mood and Affect: Mood normal.        Behavior: Behavior normal.     Labs reviewed: Recent Labs    07/04/22 0502 07/05/22 0538 07/06/22 0702 07/08/22 1714 07/12/22 0437 07/13/22 0412 07/14/22 0444 07/21/22 0000  NA 134* 135 136   < > 133* 130* 132* 132*  K 4.2 3.7 3.4*   < > 3.0* 3.5 3.3* 3.7  CL 104 106  109   < > 102 101 99 101  CO2 20* 19* 19*   < > 20* 20* 22 23*  GLUCOSE 96 94 106*   < > 113* 103* 104*  --   BUN 25* 24* 20   < > 20 22 21 19   CREATININE 1.21* 1.14* 1.01*   < >  1.21* 1.28* 1.41* 1.3*  CALCIUM 8.2* 8.4* 8.4*   < > 7.7* 8.2* 8.3* 8.3*  MG 2.4 2.4 2.4  --  1.8  --   --   --   PHOS 3.6 4.3 3.6  --   --   --   --   --    < > = values in this interval not displayed.   Recent Labs    07/04/22 0502 07/05/22 0538 07/06/22 0702 07/21/22 0000  AST 23 30 36 39*  ALT 48*  ALKPHOS 71 66 63 64  BILITOT 0.6 0.6 0.3  --   PROT 6.2* 6.3* 6.4*  --   ALBUMIN 2.2* 2.2* 2.3* 3.0*   Recent Labs    07/06/22 0702 07/08/22 1714 07/08/22 2208 07/09/22 0353 07/11/22 0411 07/13/22 0412 07/21/22 0000  WBC 11.9* 15.6*   < > 17.2* 12.8* 9.5 6.4  NEUTROABS 9.5* 12.1*  --   --   --   --  4,230.00  HGB 10.8* 11.7*   < > 11.4* 11.7* 11.9* 11.7*  HCT 37.0 38.5   < > 38.0 39.4 39.9 36  MCV 91.8 90.2   < > 90.9 89.1 89.7  --   PLT 536* 607*   < > 604* 601* 466* 221   < > = values in this interval not displayed.   Lab Results  Component Value Date   TSH 4.51 06/30/2021   No results found for: "HGBA1C" No results found for: "CHOL", "HDL", "LDLCALC", "LDLDIRECT", "TRIG", "CHOLHDL"  Significant Diagnostic Results in last 30 days:  CT HIP RIGHT W CONTRAST  Result Date: 07/11/2022 CLINICAL DATA:  Follow-up on right hip and right thigh fluid collection EXAM: CT OF THE LOWER RIGHT EXTREMITY WITH CONTRAST TECHNIQUE: Multidetector CT imaging of the lower right extremity was performed according to the standard protocol following intravenous contrast administration. RADIATION DOSE REDUCTION: This exam was performed according to the departmental dose-optimization program which includes automated exposure control, adjustment of the mA and/or kV according to patient size and/or use of iterative reconstruction technique. CONTRAST:  OMNIPAQUE IOHEXOL 300 MG/ML  SOLN COMPARISON:  CT  examination dated July 02, 2022. FINDINGS: Bones/Joint/Cartilage Redemonstration of the hardware removal from prior right hip arthroplasty. Osseous bridging of the acetabular roof. Granulation tissues at the expected location of the femoral prosthetic head. No acute osseous abnormality or significant interval change since recent examination of March 9. Ligaments Suboptimally assessed by CT. Muscles and Tendons Generalized muscle atrophy of the right hip muscles. No intramuscular fluid collection or hematoma. Soft tissues Interval near complete resolution of the large fluid collection in the right hip/thigh with residual fluid and trace amount of subcutaneous gas suggesting resolving infectious/inflammatory process IMPRESSION: 1. Interval near complete resolution of the large fluid collection in the right hip/thigh with residual fluid and trace amount of subcutaneous gas suggesting resolving infectious/inflammatory process. 2. Redemonstration of the hardware removal from prior right hip arthroplasty. Osseous bridging of the acetabular roof. Granulation tissues at the expected location of the femoral prosthetic head. No acute osseous abnormality or significant interval change since recent examination. Electronically Signed   By: Larose Hires D.O.   On: 07/11/2022 21:40   DG ESOPHAGUS W SINGLE CM (SOL OR THIN BA)  Result Date: 07/11/2022 CLINICAL DATA:  82 year old female with complaint of difficulty swallowing solids and pills. EXAM: ESOPHAGUS/BARIUM SWALLOW/TABLET STUDY TECHNIQUE: Single contrast examination was performed using thin liquid barium. This exam was performed by Loyce Dys PA-C, and  was supervised and interpreted by Carey Bullocks, MD. FLUOROSCOPY: Radiation Exposure Index (as provided by the fluoroscopic device): 17.9 mGy Kerma COMPARISON:  CT ANGIO CHEST 07/02/2022 FINDINGS: Swallowing: Appears normal. No vestibular penetration or aspiration seen. Pharynx: Unremarkable. Esophagus: Tortuous distal  esophagus without evidence of high-grade stricture, mass or mucosal ulceration. Esophageal motility: Tertiary contractions noted with retrograde flow and poor progression of barium bolus. Hiatal Hernia: Moderate sized hiatal hernia Gastroesophageal reflux: None visualized. Ingested 80mm barium tablet: Not given; patient refused. Other: Limited exam due to infected right hip, immobility, clinical status. IMPRESSION: 1. Tortuous distal esophagus without evidence of focal mucosal abnormality. 2.  Moderate sized hiatal hernia. 3. Moderate esophageal dysmotility with tertiary contractions and retrograde flow of barium bolus to the upper esophagus. 4.  No limitations above. Electronically Signed   By: Carey Bullocks M.D.   On: 07/11/2022 16:23   DG FEMUR MIN 2 VIEWS LEFT  Result Date: 07/11/2022 CLINICAL DATA:  Left hip pain and cellulitis. EXAM: LEFT FEMUR 2 VIEWS COMPARISON:  Frontal view of the bilateral hips and right hip radiographs 07/02/2022, AP pelvis 12/03/2020 FINDINGS: Moderately decreased bone mineralization. Tiny knee joint effusion. Status post total left knee arthroplasty. No perihardware lucency is seen to indicate hardware failure or loosening on the provided images. The tibial prosthetic component is only incompletely imaged on lateral view. Minimal superomedial left femoroacetabular joint space narrowing. Mild superolateral left acetabular degenerative osteophytosis. No acute fracture is seen. No dislocation. IMPRESSION: 1. Status post total left knee arthroplasty without evidence of hardware failure. 2. Minimal left femoroacetabular osteoarthritis. Electronically Signed   By: Neita Garnet M.D.   On: 07/11/2022 16:11   DG CHEST PORT 1 VIEW  Result Date: 07/11/2022 CLINICAL DATA:  Pulmonary edema. EXAM: PORTABLE CHEST 1 VIEW COMPARISON:  07/08/2022 FINDINGS: Prominent lung markings particularly in the left upper chest. Heart size is upper limits of normal but stable. Trachea is midline.  Atherosclerotic calcifications at the aortic arch. Slightly improved aeration at the left lung base. Negative for a pneumothorax. No acute bone abnormality. IMPRESSION: 1. Slightly improved aeration at the left lung base. 2. Again noted are prominent lung markings particularly in left upper lung. Findings could be associated with asymmetric interstitial edema. Electronically Signed   By: Richarda Overlie M.D.   On: 07/11/2022 08:30   ECHOCARDIOGRAM COMPLETE  Result Date: 07/09/2022    ECHOCARDIOGRAM REPORT   Patient Name:   AKEENA LONGWELL Date of Exam: 07/09/2022 Medical Rec #:  [id number]     Height:       68.0 in Accession #:    [id number]    Weight:       300.0 lb Date of Birth:  07-20-1940     BSA:          2.428 m Patient Age:    81 years      BP:           135/62 mmHg Patient Gender: F             HR:           73 bpm. Exam Location:  Inpatient Procedure: 2D Echo, Cardiac Doppler, Color Doppler, Strain Analysis and            Intracardiac Opacification Agent Indications:    Dyspnea R06.00  History:        Patient has prior history of Echocardiogram examinations, most                 recent  02/24/2020. CHF, CKD III; Risk Factors:Former Smoker and                 Hypertension.  Sonographer:    Dondra Prader RVT RCS Referring Phys: [id number] [facility]  Sonographer Comments: Suboptimal parasternal window, suboptimal apical window, Technically difficult study due to poor echo windows and patient is obese. Image acquisition challenging due to respiratory motion and Image acquisition challenging due to patient body habitus. IMPRESSIONS  1. Left ventricular ejection fraction, by estimation, is 70 to 75%. The left ventricle has hyperdynamic function. The left ventricle has no regional wall motion abnormalities. There is mild left ventricular hypertrophy of the basal-septal segment. Left ventricular diastolic parameters are indeterminate.  2. Right ventricular systolic function is normal. The right ventricular size  is normal.  3. The mitral valve is normal in structure. No evidence of mitral valve regurgitation. No evidence of mitral stenosis.  4. The aortic valve is normal in structure. Aortic valve regurgitation is not visualized. No aortic stenosis is present.  5. The inferior vena cava is normal in size with greater than 50% respiratory variability, suggesting right atrial pressure of 3 mmHg. FINDINGS  Left Ventricle: Left ventricular ejection fraction, by estimation, is 70 to 75%. The left ventricle has hyperdynamic function. The left ventricle has no regional wall motion abnormalities. The left ventricular internal cavity size was normal in size. There is mild left ventricular hypertrophy of the basal-septal segment. Left ventricular diastolic parameters are indeterminate. Right Ventricle: The right ventricular size is normal. No increase in right ventricular wall thickness. Right ventricular systolic function is normal. Left Atrium: Left atrial size was normal in size. Right Atrium: Right atrial size was normal in size. Pericardium: There is no evidence of pericardial effusion. Mitral Valve: The mitral valve is normal in structure. No evidence of mitral valve regurgitation. No evidence of mitral valve stenosis. Tricuspid Valve: The tricuspid valve is normal in structure. Tricuspid valve regurgitation is not demonstrated. No evidence of tricuspid stenosis. Aortic Valve: The aortic valve is normal in structure. Aortic valve regurgitation is not visualized. No aortic stenosis is present. Aortic valve mean gradient measures 9.5 mmHg. Aortic valve peak gradient measures 18.1 mmHg. Aortic valve area, by VTI measures 1.58 cm. Pulmonic Valve: The pulmonic valve was normal in structure. Pulmonic valve regurgitation is not visualized. No evidence of pulmonic stenosis. Aorta: The aortic root is normal in size and structure. Venous: The inferior vena cava is normal in size with greater than 50% respiratory variability, suggesting  right atrial pressure of 3 mmHg. IAS/Shunts: No atrial level shunt detected by color flow Doppler.  LEFT VENTRICLE PLAX 2D LVIDd:         4.50 cm   Diastology LVIDs:         2.80 cm   LV e' medial:    6.42 cm/s LV PW:         1.00 cm   LV E/e' medial:  12.3 LV IVS:        1.20 cm   LV e' lateral:   9.79 cm/s LVOT diam:     1.60 cm   LV E/e' lateral: 8.0 LV SV:         62 LV SV Index:   26 LVOT Area:     2.01 cm  RIGHT VENTRICLE             IVC RV Basal diam:  3.90 cm     IVC diam: 1.50 cm RV Mid diam:  3.40 cm RV S prime:     20.30 cm/s TAPSE (M-mode): 2.5 cm LEFT ATRIUM           Index        RIGHT ATRIUM           Index LA diam:      3.20 cm 1.32 cm/m   RA Area:     16.80 cm LA Vol (A2C): 71.7 ml 29.53 ml/m  RA Volume:   41.50 ml  17.09 ml/m LA Vol (A4C): 64.6 ml 26.61 ml/m  AORTIC VALVE AV Area (Vmax):    1.39 cm AV Area (Vmean):   1.37 cm AV Area (VTI):     1.58 cm AV Vmax:           213.00 cm/s AV Vmean:          140.500 cm/s AV VTI:            0.392 m AV Peak Grad:      18.1 mmHg AV Mean Grad:      9.5 mmHg LVOT Vmax:         147.00 cm/s LVOT Vmean:        95.700 cm/s LVOT VTI:          0.308 m LVOT/AV VTI ratio: 0.79  AORTA Ao Root diam: 2.20 cm Ao Asc diam:  3.00 cm MITRAL VALVE               TRICUSPID VALVE MV Area (PHT): 2.80 cm    TR Peak grad:   18.3 mmHg MV Decel Time: 271 msec    TR Vmax:        214.00 cm/s MV E velocity: 78.80 cm/s MV A velocity: 55.80 cm/s  SHUNTS MV E/A ratio:  1.41        Systemic VTI:  0.31 m                            Systemic Diam: 1.60 cm Donato Schultz MD Electronically signed by Donato Schultz MD Signature Date/Time: 07/09/2022/12:27:40 PM    Final    DG Chest Portable 1 View  Result Date: 07/08/2022 CLINICAL DATA:  Short of breath, pneumonia EXAM: PORTABLE CHEST 1 VIEW COMPARISON:  07/06/2022 FINDINGS: Single frontal view of the chest demonstrates stable enlargement of the cardiac silhouette. Multifocal bilateral airspace disease greatest in the right upper and left  lower lung zones. Small left effusion is suspected. Continued central vascular congestion. No pneumothorax. No acute bony abnormality. IMPRESSION: 1. Constellation of findings most suggestive of congestive heart failure, with no significant change in volume status since prior study. Underlying pneumonia would be difficult to exclude. Electronically Signed   By: Sharlet Salina M.D.   On: 07/08/2022 17:36   DG CHEST PORT 1 VIEW  Result Date: 07/06/2022 CLINICAL DATA:  Shortness of breath. EXAM: PORTABLE CHEST 1 VIEW COMPARISON:  None of 07/04/2022 FINDINGS: Stable cardiomediastinal contours. Lung volumes are low. Mild scratch set increase interstitial markings are identified bilaterally concerning for pulmonary edema. Cannot exclude small pleural effusions. Decreased aeration to the left base may represent atelectasis or airspace disease. IMPRESSION: 1. Suspect mild congestive heart failure. 2. Decreased aeration to the left base may represent atelectasis or airspace disease. Electronically Signed   By: Signa Kell M.D.   On: 07/06/2022 08:05   DG CHEST PORT 1 VIEW  Result Date: 07/04/2022 CLINICAL DATA:  Shortness of breath. EXAM: PORTABLE CHEST 1 VIEW COMPARISON:  07/03/2018 for FINDINGS: Stable cardiomediastinal contours. Low lung volumes. Unchanged left base atelectasis. No signs of pleural effusion, interstitial edema or consolidation. IMPRESSION: No change in aeration to the lungs compared with previous exam. Electronically Signed   By: Signa Kell M.D.   On: 07/04/2022 08:18   DG CHEST PORT 1 VIEW  Result Date: 07/03/2022 CLINICAL DATA:  Shortness of breath. EXAM: PORTABLE CHEST 1 VIEW COMPARISON:  07/02/2022 and prior studies FINDINGS: This is a low volume study. The cardiomediastinal silhouette is unchanged. Mild LEFT basilar opacities are noted. There is no evidence of pneumothorax or pleural effusion. No acute bony abnormalities are noted. IMPRESSION: Mild LEFT basilar opacities, favor  atelectasis but infection/pneumonia is not excluded. Electronically Signed   By: Harmon Pier M.D.   On: 07/03/2022 16:30    Assessment/Plan 1. Absence of hip joint, right Surgically removed status post infection.  Patient is nonambulatory but gets around in her electric scooter  2. Cellulitis of right hip Cellulitis was treated with antibiotics.  There was some concern that there might be a recurrence of the infected hip joint rather than more superficial cellulitis  3. Essential hypertension Blood pressure treated with furosemide  4. Pressure injury of thigh, stage 2, unspecified laterality Patient did have some local erythema that was treated but no wound at this time  5. Vaginal bleeding Leading was transitory.  She is getting some topical estrogen.  If problem recurs or worsens we will consider use of Megace    Family/ staff Communication: Discussed with husband  Labs/tests ordered:    Bertram Millard. Hyacinth Meeker, MD [facility] [address] [place], [place] [address] Office [phone]

## 2022-08-18 LAB — HEPATIC FUNCTION PANEL
ALT: 18 U/L (ref 7–35)
AST: 22 (ref 13–35)
Alkaline Phosphatase: 85 (ref 25–125)
Bilirubin, Total: 0.4

## 2022-08-18 LAB — BASIC METABOLIC PANEL
BUN: 15 (ref 4–21)
CO2: 27 — AB (ref 13–22)
Chloride: 105 (ref 99–108)
Creatinine: 1.2 — AB (ref 0.5–1.1)
Glucose: 86
Potassium: 3.3 mEq/L — AB (ref 3.5–5.1)
Sodium: 139 (ref 137–147)

## 2022-08-18 LAB — COMPREHENSIVE METABOLIC PANEL
Albumin: 2.9 — AB (ref 3.5–5.0)
Calcium: 8.5 — AB (ref 8.7–10.7)
Globulin: 2.8

## 2022-08-23 LAB — BASIC METABOLIC PANEL
BUN: 18 (ref 4–21)
CO2: 28 — AB (ref 13–22)
Chloride: 104 (ref 99–108)
Creatinine: 1.3 — AB (ref 0.5–1.1)
Glucose: 81
Potassium: 3.7 mEq/L (ref 3.5–5.1)
Sodium: 139 (ref 137–147)

## 2022-08-23 LAB — COMPREHENSIVE METABOLIC PANEL: Calcium: 8.3 — AB (ref 8.7–10.7)

## 2022-08-25 ENCOUNTER — Non-Acute Institutional Stay (SKILLED_NURSING_FACILITY): Payer: Medicare Other | Admitting: Nurse Practitioner

## 2022-08-25 ENCOUNTER — Encounter: Payer: Self-pay | Admitting: Nurse Practitioner

## 2022-08-25 DIAGNOSIS — B372 Candidiasis of skin and nail: Secondary | ICD-10-CM | POA: Diagnosis not present

## 2022-08-25 DIAGNOSIS — F5105 Insomnia due to other mental disorder: Secondary | ICD-10-CM

## 2022-08-25 DIAGNOSIS — I10 Essential (primary) hypertension: Secondary | ICD-10-CM

## 2022-08-25 DIAGNOSIS — I5032 Chronic diastolic (congestive) heart failure: Secondary | ICD-10-CM

## 2022-08-25 DIAGNOSIS — M069 Rheumatoid arthritis, unspecified: Secondary | ICD-10-CM

## 2022-08-25 DIAGNOSIS — E039 Hypothyroidism, unspecified: Secondary | ICD-10-CM

## 2022-08-25 DIAGNOSIS — E871 Hypo-osmolality and hyponatremia: Secondary | ICD-10-CM | POA: Diagnosis not present

## 2022-08-25 DIAGNOSIS — N1831 Chronic kidney disease, stage 3a: Secondary | ICD-10-CM | POA: Diagnosis not present

## 2022-08-25 DIAGNOSIS — K2100 Gastro-esophageal reflux disease with esophagitis, without bleeding: Secondary | ICD-10-CM

## 2022-08-25 DIAGNOSIS — D500 Iron deficiency anemia secondary to blood loss (chronic): Secondary | ICD-10-CM

## 2022-08-25 DIAGNOSIS — Z89621 Acquired absence of right hip joint: Secondary | ICD-10-CM

## 2022-08-25 DIAGNOSIS — F418 Other specified anxiety disorders: Secondary | ICD-10-CM

## 2022-08-25 NOTE — Assessment & Plan Note (Signed)
Bun/creat 18/1.32 08/23/22

## 2022-08-25 NOTE — Assessment & Plan Note (Signed)
CHF/Edema, dependent  areas, takes Furosemide, Spironolactone

## 2022-08-25 NOTE — Assessment & Plan Note (Signed)
Mid to lower back skin folds redness, will apply 0.5% triamcinolone cream/Nystatin cream bid to affected areas until healed.

## 2022-08-25 NOTE — Assessment & Plan Note (Signed)
nauseated sometimes, better on  Pantoprazole, off  Omeprazole, prn Zofran. Hgb 11.9 07/13/22

## 2022-08-25 NOTE — Assessment & Plan Note (Signed)
f/u Rheumatology, hx of Plaquenil use, stable presently.  ?

## 2022-08-25 NOTE — Assessment & Plan Note (Signed)
takes Levothyroxine, TSH 3.48 06/28/22 

## 2022-08-25 NOTE — Assessment & Plan Note (Signed)
  Hospitalized 07/08/22 for CHF, abscess of the right hip which previous infected, treated, and prosthesis was removed and never replaced. Open drainage site presents.               Hospitalized 07/02/22-07/07/22 for the right hip cellulitis, CT hip in hospital fluid collection unclear if it communicates with the joint space, Ortho not felt to be related to the joint space and may be an old fluid collection, aspiration not recommended.   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 F/u Ortho. TDWB. Power wc. 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 Fully treated, then off antibiotics.

## 2022-08-25 NOTE — Progress Notes (Signed)
Location:  [facility] Room Number: 56-A Place of Service:  SNF (31) Provider:  Chipper Oman, NP   Patient Care Team: Heffner, Jimmye Norman, MD as PCP - General (Family Medicine) [name], MD as PCP - Cardiology (Cardiology) [name], MD as PCP - Sleep Medicine (Cardiology) [name], MD (Sports Medicine) [name], MD (Ophthalmology) [name], [name], MD (Dermatology) [name], MD (Gastroenterology) [name], MD (Nephrology) [name], DC as Referring Physician (Chiropractic Medicine)  Extended Emergency Contact Information Primary Emergency Contact: [facility] Address: [address]          [place], [place] [address] [name] of [place] Home Phone: [phone] Mobile Phone: [phone] Relation: Spouse  Code Status:  DNR Goals of care: Advanced Directive information    08/25/2022    9:14 AM  Advanced Directives  Does Patient Have a Medical Advance Directive? Yes  Type of Advance Directive Living will;Out of facility DNR (pink MOST or yellow form)  Does patient want to make changes to medical advance directive? No - Patient declined  Pre-existing out of facility DNR order (yellow form or pink MOST form) Yellow form placed in chart (order not valid for inpatient use)     Chief Complaint  Patient presents with   Medical Management of Chronic Issues    Routine visit. Discuss need for additional covid boosters     HPI:  Pt is a 82 y.o. female seen today for medical management of chronic diseases.     Hospitalized 07/08/22 for CHF, abscess of the right hip which previous infected, treated, and prosthesis was removed and never replaced. Open drainage site presents.               Hospitalized 07/02/22-07/07/22 for the right hip cellulitis, CT hip in hospital fluid collection unclear if it communicates with the joint space, Ortho not felt to be related to the joint space and may  be an old fluid collection, aspiration not recommended.   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 F/u Ortho. TDWB. Power wc. 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 Fully treated, then off antibiotics.              Hypoxia, weaned off O2 after well diuresed.              Weight gained, off Mirtazapine.  Elevated AST/ALT normalized, S/p cholecystectomy, [place] 02/19/21 no cyst or mass.              Anemia, post op, s/p EGD no active bleed. S/p 6 u PRBC transfusion, ASA was dc'd, On Fe, Iron 15 in hospital, Hgb 11.9 07/13/22             HTN only on Furosemide, Spironolactone.              Morbid obesity             OSA CPAP             Chronic diarrhea, prn Imodium, Align             RA f/u Rheumatology, hx of Plaquenil use, stable presently.              Insomnia/depression/anxiety, stable, off Mirtazapine.              GERD, nauseated sometimes, better on  Pantoprazole, off  Omeprazole, prn Zofran. Hgb 11.9 07/13/22  CHF/Edema, dependent  areas, takes Furosemide, Spironolactone CKD stage 3 Bun/creat 18/1.32 08/23/22 Hyponatremia, Na 139 08/23/22             Hypothyroidism, takes Levothyroxine, TSH 3.48 06/28/22   Past Medical History:  Diagnosis Date   Benign hypertension with chronic kidney disease, stage III (HCC)    Overview:  Last Assessment & Plan:  Usually the patient is hypertensive. However today her blood pressure is 105 systolic. She has been feeling fatigued with some lightheadedness. Part of this may be related to her lower blood pressure. Her pressure may be improved with her decrease in salt and fluid intake. I've instructed her to put her lisinopril/hctz on hold. I've asked her to see her primary    Cardiac disease 03/10/2014   Chronic diarrhea 07/27/2015   Edema 07/27/2015   Ejection fraction 2004   Normal, echo,    Gastroesophageal reflux disease    GERD (gastroesophageal reflux disease)    Heart  disease 03/10/2014   Hypertension    Left bundle branch block 09/01/2020   Obesity    Obesity (BMI 30-39.9) 09/25/2017   OSA (obstructive sleep apnea)    mild with AHI 6.75 - on CPAP   Preop cardiovascular exam 11/2010   Cardiac clearance for knee surgery    Sleep apnea 03/10/2014   Supraventricular tachycardia    Documented episode in the past, possibly reentrant tachycardia  Overview:  Overview:  Documented episode in the past, 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 It was possibly reentrant. She does well with diltiazem. No change in therapy.   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   Thyroid disease    Urinary incontinence    Past Surgical History:  Procedure Laterality Date   CATARACT EXTRACTION Left 8/11   CATARACT EXTRACTION Right 3/12   CHOLECYSTECTOMY  1994   Copsilotomy Laser Treatment Left 6/12   eye   ELBOW SURGERY  8/12   EYE SURGERY Left 09/2008   macular hole   EYE SURGERY Right 12/11   Lumbar Infusion  2011   MOLE REMOVAL  06/17/2015   TOTAL KNEE ARTHROPLASTY Left 6/11   TOTAL KNEE ARTHROPLASTY Right 06/13/2011    Allergies  Allergen Reactions   Keflex [Cephalexin] Diarrhea   Macrobid [Nitrofurantoin] Hives   Amoxicillin Rash    Outpatient Encounter Medications as of 08/25/2022  Medication Sig   acetaminophen (TYLENOL) 325 MG tablet Take 650 mg by mouth every 6 (six) hours as needed for moderate pain.   albuterol (VENTOLIN HFA) 108 (90 Base) MCG/ACT inhaler Inhale 2 puffs into the lungs every 6 (six) hours as needed for wheezing or shortness of breath.   bifidobacterium infantis (ALIGN) capsule Take 1 capsule by mouth daily.   Calcium Carb-Cholecalciferol (CALCIUM PLUS VITAMIN D3 PO) Take 1 tablet by mouth daily. Vitamin D3 + Calcium 600   Cholecalciferol (VITAMIN D3) 50 MCG (2000 UT) TABS  Take 2 tablets by mouth daily.   ferrous sulfate 325 (65 FE) MG EC tablet Take 325 mg by mouth as directed. Once A Day on Mon, Thu   furosemide (LASIX) 40 MG tablet Take 40 mg by mouth 2 (two) times daily.   gabapentin (NEURONTIN) 300 MG capsule Take 300 mg by mouth at bedtime.   ipratropium-albuterol (DUONEB) 0.5-2.5 (3) MG/3ML SOLN Inhale 3 mLs into the lungs every 8 (eight) hours as needed (  wheezing).   levothyroxine (SYNTHROID) 25 MCG tablet Give 1 tablet orally one time a day every 2 day(s); 2 tablets (50 mcg) one time a day every 2 day(s)   loperamide (IMODIUM) 2 MG capsule Take 2 mg by mouth every 4 (four) hours as needed for diarrhea or loose stools.   nystatin (MYCOSTATIN/NYSTOP) powder Apply 1 application  topically in the morning, at noon, in the evening, and at bedtime. To genital areas and inner thigh   ondansetron (ZOFRAN-ODT) 4 MG disintegrating tablet Take 4 mg by mouth every 8 (eight) hours as needed for nausea or vomiting.   pantoprazole (PROTONIX) 40 MG tablet Take 40 mg by mouth daily.   potassium chloride SA (KLOR-CON) 20 MEQ tablet Take 20 mEq by mouth daily.   spironolactone (ALDACTONE) 25 MG tablet Take 1 tablet (25 mg total) by mouth daily.   [DISCONTINUED] estradiol (ESTRACE VAGINAL) 0.1 MG/GM vaginal cream Place 1 Applicatorful vaginally 3 (three) times a week.   [DISCONTINUED] mineral oil-hydrophilic petrolatum (AQUAPHOR) ointment Apply 1 Application topically in the morning and at bedtime. Apply to (L) arm &Chest topically two times a day for Itching;Rash related to ALLERGIC CONTACT DERMATITIS DU   No facility-administered encounter medications on file as of 08/25/2022.    Review of Systems  Constitutional:  Negative for appetite change, fatigue and fever.  HENT:  Negative for congestion, sore throat and trouble swallowing.   Eyes:  Negative for visual disturbance.  Respiratory:  Negative for cough, shortness of breath and wheezing.   Cardiovascular:  Negative for  leg swelling.  Gastrointestinal:  Negative for abdominal pain, constipation and nausea.  Genitourinary:  Negative for dysuria, frequency, genital sores, urgency and vaginal bleeding.  Musculoskeletal:  Positive for arthralgias and gait problem.  Skin:  Positive for rash and wound. Negative for color change.       Right hip region redness, warmth, swelling, drainage opening present. Rash in the mid to lower back skin folds.   Neurological:  Negative for speech difficulty and weakness.  Psychiatric/Behavioral:  Negative for confusion and sleep disturbance. The patient is not nervous/anxious.     Immunization History  Administered Date(s) Administered   Influenza Split 01/28/2008, 02/17/2009, 02/20/2012, 02/11/2013, 02/02/2016, 01/11/2017, 02/04/2019   Influenza, High Dose Seasonal PF 02/04/2015, 01/11/2017, 01/30/2018, 02/05/2020   Influenza-Unspecified 02/16/2022   Moderna SARS-COV2 Booster Vaccination 02/24/2022   Moderna Sars-Covid-2 Vaccination 05/09/2019, 05/27/2019, 03/03/2020, 09/04/2020, 11/04/2020   Pfizer Covid-19 Vaccine Bivalent Booster 72yrs & up 02/09/2021   Pneumococcal Conjugate-13 08/30/2013   Pneumococcal Polysaccharide-23 11/03/2005   RSV,unspecified 04/22/2022   Td 08/01/2001, 09/05/2019   Tdap 09/22/2010, 11/24/2020   Zoster Recombinat (Shingrix) 11/08/2016, 01/11/2017   Zoster, Live 11/04/2005, 11/08/2016, 01/11/2017   Zoster, Unspecified 01/11/2017   Pertinent  Health Maintenance Due  Topic Date Due   INFLUENZA VACCINE  11/24/2022   DEXA SCAN  Completed      10/24/2020   11:31 AM 04/15/2022   10:15 AM 04/27/2022   11:35 AM 05/31/2022    9:34 AM 07/26/2022    9:31 AM  Fall Risk  Falls in the past year?  0 0 0 0  Was there an injury with Fall?  0 0 0 0  Fall Risk Category Calculator  0 0 0 0  Fall Risk Category (Retired)  Low Low    (RETIRED) Patient Fall Risk Level Moderate fall risk Moderate fall risk Moderate fall risk    Patient at Risk for Falls Due to   History of fall(s) History of  fall(s) History of fall(s)   Fall risk Follow up  Falls evaluation completed Falls evaluation completed Falls evaluation completed    Functional Status Survey:    Vitals:   08/25/22 0913  BP: (!) 102/51  Pulse: 72  Weight: 285 lb (129.3 kg)  Height: 5\' 8"  (1.727 m)   Body mass index is 43.33 kg/m. Physical Exam Vitals and nursing note reviewed.  Constitutional:      Appearance: Normal appearance. She is obese.  HENT:     Head: Normocephalic and atraumatic.     Nose: Nose normal.     Mouth/Throat:     Mouth: Mucous membranes are moist.  Eyes:     Extraocular Movements: Extraocular movements intact.     Conjunctiva/sclera: Conjunctivae normal.     Pupils: Pupils are equal, round, and reactive to light.  Cardiovascular:     Rate and Rhythm: Normal rate and regular rhythm.     Heart sounds: No murmur heard. Pulmonary:     Effort: Pulmonary effort is normal.     Breath sounds: No rales.  Abdominal:     General: Bowel sounds are normal.     Palpations: Abdomen is soft.     Tenderness: There is no abdominal tenderness.  Musculoskeletal:     Cervical back: Normal range of motion and neck supple.     Right lower leg: No edema.     Left lower leg: No edema.     Comments: R hip, prosthetic hip removed.   Skin:    General: Skin is warm and dry.     Findings: Erythema present.     Comments: Right hip surgical scar, right hip region redness, warmth, swelling, drainage opening present. Redness in the mid to lower back skin folds.   Neurological:     General: No focal deficit present.     Mental Status: She is alert and oriented to person, place, and time.     Gait: Gait normal.  Psychiatric:        Mood and Affect: Mood normal.        Behavior: Behavior normal.        Thought Content: Thought content normal.        Judgment: Judgment normal.     Labs reviewed: Recent Labs    07/04/22 0502 07/05/22 0538 07/06/22 0702 07/08/22 1714  07/12/22 0437 07/13/22 0412 07/14/22 0444 07/21/22 0000 08/18/22 0000 08/23/22 0000  NA 134* 135 136   < > 133* 130* 132* 132* 139 139  K 4.2 3.7 3.4*   < > 3.0* 3.5 3.3* 3.7 3.3* 3.7  CL 104 106 109   < > 102 101 99 101 105 104  CO2 20* 19* 19*   < > 20* 20* 22 23* 27* 28*  GLUCOSE 96 94 106*   < > 113* 103* 104*  --   --   --   BUN 25* 24* 20   < > 20 22 21 19 15 18   CREATININE 1.21* 1.14* 1.01*   < > 1.21* 1.28* 1.41* 1.3* 1.2* 1.3*  CALCIUM 8.2* 8.4* 8.4*   < > 7.7* 8.2* 8.3* 8.3* 8.5* 8.3*  MG 2.4 2.4 2.4  --  1.8  --   --   --   --   --   PHOS 3.6 4.3 3.6  --   --   --   --   --   --   --    < > = values in this interval  not displayed.   Recent Labs    07/04/22 0502 07/05/22 0538 07/06/22 0702 07/21/22 0000 08/18/22 0000  AST 23 30 36 39* 22  ALT 17 21 25  48* 18  ALKPHOS 71 66 63 64 85  BILITOT 0.6 0.6 0.3  --   --   PROT 6.2* 6.3* 6.4*  --   --   ALBUMIN 2.2* 2.2* 2.3* 3.0* 2.9*   Recent Labs    07/06/22 0702 07/08/22 1714 07/08/22 2208 07/09/22 0353 07/11/22 0411 07/13/22 0412 07/21/22 0000  WBC 11.9* 15.6*   < > 17.2* 12.8* 9.5 6.4  NEUTROABS 9.5* 12.1*  --   --   --   --  4,230.00  HGB 10.8* 11.7*   < > 11.4* 11.7* 11.9* 11.7*  HCT 37.0 38.5   < > 38.0 39.4 39.9 36  MCV 91.8 90.2   < > 90.9 89.1 89.7  --   PLT 536* 607*   < > 604* 601* 466* 221   < > = values in this interval not displayed.   Lab Results  Component Value Date   TSH 4.51 06/30/2021   No results found for: "HGBA1C" No results found for: "CHOL", "HDL", "LDLCALC", "LDLDIRECT", "TRIG", "CHOLHDL"  Significant Diagnostic Results in last 30 days:  No results found.  Assessment/Plan Candidiasis of skin Mid to lower back skin folds redness, will apply 0.5% triamcinolone cream/Nystatin cream bid to affected areas until healed.   Acquired hypothyroidism takes Levothyroxine, TSH 3.48 06/28/22  Hyponatremia Na 139 08/23/22  Stage 3a chronic kidney disease (CKD) (HCC) Bun/creat 18/1.32  08/23/22  Chronic diastolic CHF (congestive heart failure) (HCC) CHF/Edema, dependent  areas, takes Furosemide, Spironolactone  GERD (gastroesophageal reflux disease) nauseated sometimes, better on  Pantoprazole, off  Omeprazole, prn Zofran. Hgb 11.9 07/13/22  Insomnia secondary to depression with anxiety  Insomnia/depression/anxiety, stable, off Mirtazapine.   Rheumatoid arthritis (HCC)  f/u Rheumatology, hx of Plaquenil use, stable presently.   Essential hypertension Blood pressure is controlled, only on Furosemide, Spironolactone.   Blood loss anemia  post op, s/p EGD no active bleed. S/p 6 u PRBC transfusion, ASA was dc'd, On Fe, Iron 15 in hospital, Hgb 11.9 07/13/22     Family/ staff Communication: plan of care reviewed with the patient and charge nurse.   Labs/tests ordered:  none  Time spend 35 minutes.

## 2022-08-25 NOTE — Assessment & Plan Note (Signed)
Insomnia/depression/anxiety, stable, off Mirtazapine.

## 2022-08-25 NOTE — Assessment & Plan Note (Signed)
Na 139 08/23/22

## 2022-08-25 NOTE — Assessment & Plan Note (Signed)
post op, s/p EGD no active bleed. S/p 6 u PRBC transfusion, ASA was dc'd, On Fe, Iron 15 in hospital, Hgb 11.9 07/13/22 

## 2022-08-25 NOTE — Assessment & Plan Note (Signed)
Blood pressure is controlled, only on Furosemide, Spironolactone.

## 2022-09-05 ENCOUNTER — Non-Acute Institutional Stay (SKILLED_NURSING_FACILITY): Payer: Medicare Other | Admitting: Nurse Practitioner

## 2022-09-05 ENCOUNTER — Encounter: Payer: Self-pay | Admitting: Nurse Practitioner

## 2022-09-05 DIAGNOSIS — R300 Dysuria: Secondary | ICD-10-CM | POA: Diagnosis not present

## 2022-09-05 DIAGNOSIS — Z89621 Acquired absence of right hip joint: Secondary | ICD-10-CM

## 2022-09-05 DIAGNOSIS — D500 Iron deficiency anemia secondary to blood loss (chronic): Secondary | ICD-10-CM

## 2022-09-05 DIAGNOSIS — F418 Other specified anxiety disorders: Secondary | ICD-10-CM

## 2022-09-05 DIAGNOSIS — I5032 Chronic diastolic (congestive) heart failure: Secondary | ICD-10-CM

## 2022-09-05 DIAGNOSIS — E039 Hypothyroidism, unspecified: Secondary | ICD-10-CM | POA: Diagnosis not present

## 2022-09-05 DIAGNOSIS — F5105 Insomnia due to other mental disorder: Secondary | ICD-10-CM

## 2022-09-05 DIAGNOSIS — K2100 Gastro-esophageal reflux disease with esophagitis, without bleeding: Secondary | ICD-10-CM

## 2022-09-05 DIAGNOSIS — M62838 Other muscle spasm: Secondary | ICD-10-CM | POA: Diagnosis not present

## 2022-09-05 DIAGNOSIS — I10 Essential (primary) hypertension: Secondary | ICD-10-CM

## 2022-09-05 NOTE — Assessment & Plan Note (Signed)
only on Furosemide, Spironolactone, controlled blood pressure.

## 2022-09-05 NOTE — Assessment & Plan Note (Signed)
s/p right THR 2018, removal of hardware 12/29/20,  takes Tylenol, Gabapentin. F/u Ortho. TDWB. Power wc. 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 Fully treated, then off antibiotics.

## 2022-09-05 NOTE — Assessment & Plan Note (Signed)
c/o lower back muscle spasm, state its not new, hx of inj for the treatment, will try Robaxin 500mg  q8hr x 24hrs, then prn.

## 2022-09-05 NOTE — Assessment & Plan Note (Signed)
dependent  areas, takes Furosemide, Spironolactone

## 2022-09-05 NOTE — Progress Notes (Signed)
Location:   SNF [facility] Room Number: 49 Place of Service:  SNF (31) Provider: Arna Snipe Alajia Schmelzer NP  Heffner, Jimmye Norman, MD  Patient Care Team: Heffner, Jimmye Norman, MD as PCP - General (Family Medicine) [name], MD as PCP - Cardiology (Cardiology) [name], MD as PCP - Sleep Medicine (Cardiology) [name], MD (Sports Medicine) [name], MD (Ophthalmology) [name], [name], MD (Dermatology) [name], MD (Gastroenterology) [name], MD (Nephrology) [name], DC as Referring Physician (Chiropractic Medicine)  Extended Emergency Contact Information Primary Emergency Contact: [facility] Address: [address]          [place], [place] [address] [name] of [place] Home Phone: [phone] Mobile Phone: [phone] Relation: Spouse  Code Status: DNR Goals of care: Advanced Directive information    08/25/2022    9:14 AM  Advanced Directives  Does Patient Have a Medical Advance Directive? Yes  Type of Advance Directive Living will;Out of facility DNR (pink MOST or yellow form)  Does patient want to make changes to medical advance directive? No - Patient declined  Pre-existing out of facility DNR order (yellow form or pink MOST form) Yellow form placed in chart (order not valid for inpatient use)     Chief Complaint  Patient presents with   Acute Visit    Urinary symptoms, lower back muscle spasm    HPI:  Pt is a 82 y.o. female seen today for an acute visit for c/o urinary symptoms-urogenital itching per the patient, no apparent skin irritation or rash present upon my examination, denied lower abd pain, nausea, vomiting, or fever/chill. Also c/o lower back muscle spasm, state its not new, hx of inj for the treatment .   Hospitalized 07/08/22 for CHF, abscess of the right hip which previous infected, treated, and prosthesis was removed and never replaced. Open drainage site presents.                Hospitalized 07/02/22-07/07/22 for the right hip cellulitis, CT hip in hospital fluid collection unclear if it communicates with the joint space, Ortho not felt to be related to the joint space and may be an old fluid collection, aspiration not recommended.              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 F/u Ortho. TDWB. Power wc. 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 Fully treated, then off antibiotics.              Hypoxia, weaned off O2 after well diuresed.              Weight gained, off Mirtazapine.  Elevated AST/ALT normalized, S/p cholecystectomy, [place] 02/19/21 no cyst or mass.              Anemia, post op, s/p EGD no active bleed. S/p 6 u PRBC transfusion, ASA was dc'd, On Fe, Iron 15 in hospital, Hgb 11.7 07/21/22             HTN only on Furosemide, Spironolactone.              Morbid obesity             OSA CPAP             Chronic diarrhea, prn Imodium, Align             RA f/u Rheumatology, hx of Plaquenil use, stable presently.  Insomnia/depression/anxiety, stable, off Mirtazapine.              GERD, better on  Pantoprazole, off  Omeprazole, prn Zofran. Hgb 11.7 08/18/22             CHF/Edema, dependent  areas, takes Furosemide, Spironolactone CKD stage 3 Bun/creat 18/1.3 08/23/22 Hyponatremia, Na 139 08/23/22             Hypothyroidism, takes Levothyroxine, TSH 3.48 06/28/22      Past Medical History:  Diagnosis Date   Benign hypertension with chronic kidney disease, stage III (HCC)    Overview:  Last Assessment & Plan:  Usually the patient is hypertensive. However today her blood pressure is 105 systolic. She has been feeling fatigued with some lightheadedness. Part of this may be related to her lower blood pressure. Her pressure may be improved with her decrease in salt and fluid intake. I've instructed her to put her lisinopril/hctz on hold. I've asked her to see her primary    Cardiac  disease 03/10/2014   Chronic diarrhea 07/27/2015   Edema 07/27/2015   Ejection fraction 2004   Normal, echo,    Gastroesophageal reflux disease    GERD (gastroesophageal reflux disease)    Heart disease 03/10/2014   Hypertension    Left bundle branch block 09/01/2020   Obesity    Obesity (BMI 30-39.9) 09/25/2017   OSA (obstructive sleep apnea)    mild with AHI 6.75 - on CPAP   Preop cardiovascular exam 11/2010   Cardiac clearance for knee surgery    Sleep apnea 03/10/2014   Supraventricular tachycardia    Documented episode in the past, possibly reentrant tachycardia  Overview:  Overview:  Documented episode in the past, 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 It was possibly reentrant. She does well with diltiazem. No change in therapy.   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   Thyroid disease    Urinary incontinence    Past Surgical History:  Procedure Laterality Date   CATARACT EXTRACTION Left 8/11   CATARACT EXTRACTION Right 3/12   CHOLECYSTECTOMY  1994   Copsilotomy Laser Treatment Left 6/12   eye   ELBOW SURGERY  8/12   EYE SURGERY Left 09/2008   macular hole   EYE SURGERY Right 12/11   Lumbar Infusion  2011   MOLE REMOVAL  06/17/2015   TOTAL KNEE ARTHROPLASTY Left 6/11   TOTAL KNEE ARTHROPLASTY Right 06/13/2011    Allergies  Allergen Reactions   Keflex [Cephalexin] Diarrhea   Macrobid [Nitrofurantoin] Hives   Amoxicillin Rash    Allergies as of 09/05/2022       Reactions   Keflex [cephalexin] Diarrhea   Macrobid [nitrofurantoin] Hives   Amoxicillin Rash        Medication List        Accurate as of Sep 05, 2022 11:30 AM. If you have any questions, ask your nurse or doctor.          acetaminophen 325 MG tablet Commonly known as: TYLENOL Take 650 mg by mouth every 6 (six) hours as  needed for moderate pain.   albuterol 108 (90 Base) MCG/ACT inhaler Commonly known as: VENTOLIN HFA Inhale 2 puffs into the lungs every 6 (six) hours as needed for wheezing or shortness of breath.   bifidobacterium infantis capsule Take 1 capsule by mouth daily.   CALCIUM  PLUS VITAMIN D3 PO Take 1 tablet by mouth daily. Vitamin D3 + Calcium 600   ferrous sulfate 325 (65 FE) MG EC tablet Take 325 mg by mouth as directed. Once A Day on Mon, Thu   furosemide 40 MG tablet Commonly known as: LASIX Take 40 mg by mouth 2 (two) times daily.   gabapentin 300 MG capsule Commonly known as: NEURONTIN Take 300 mg by mouth at bedtime.   ipratropium-albuterol 0.5-2.5 (3) MG/3ML Soln Commonly known as: DUONEB Inhale 3 mLs into the lungs every 8 (eight) hours as needed (wheezing).   levothyroxine 25 MCG tablet Commonly known as: SYNTHROID Give 1 tablet orally one time a day every 2 day(s); 2 tablets (50 mcg) one time a day every 2 day(s)   loperamide 2 MG capsule Commonly known as: IMODIUM Take 2 mg by mouth every 4 (four) hours as needed for diarrhea or loose stools.   nystatin powder Commonly known as: MYCOSTATIN/NYSTOP Apply 1 application  topically in the morning, at noon, in the evening, and at bedtime. To genital areas and inner thigh   ondansetron 4 MG disintegrating tablet Commonly known as: ZOFRAN-ODT Take 4 mg by mouth every 8 (eight) hours as needed for nausea or vomiting.   potassium chloride SA 20 MEQ tablet Commonly known as: KLOR-CON M Take 20 mEq by mouth daily.   Protonix 40 MG tablet Generic drug: pantoprazole Take 40 mg by mouth daily.   spironolactone 25 MG tablet Commonly known as: ALDACTONE Take 1 tablet (25 mg total) by mouth daily.   Vitamin D3 50 MCG (2000 UT) Tabs Take 2 tablets by mouth daily.        Review of Systems  Constitutional:  Negative for appetite change, fatigue and fever.  HENT:  Negative for congestion, sore throat and  trouble swallowing.   Eyes:  Negative for visual disturbance.  Respiratory:  Negative for cough, shortness of breath and wheezing.   Cardiovascular:  Negative for leg swelling.  Gastrointestinal:  Negative for abdominal pain, constipation and nausea.  Genitourinary:  Positive for dysuria. Negative for difficulty urinating, frequency, genital sores, urgency, vaginal bleeding and vaginal discharge.       Urogenital itching upon urination w/o skin irritation or rash.  Musculoskeletal:  Positive for arthralgias and gait problem.       Lower back muscle spasm  Skin:  Positive for wound. Negative for color change and rash.       Resolved ight hip region redness, warmth, swelling, currently drainage opening present. Improved rash in the mid to lower back skin folds.   Neurological:  Negative for speech difficulty and weakness.  Psychiatric/Behavioral:  Negative for confusion and sleep disturbance. The patient is not nervous/anxious.     Immunization History  Administered Date(s) Administered   Influenza Split 01/28/2008, 02/17/2009, 02/20/2012, 02/11/2013, 02/02/2016, 01/11/2017, 02/04/2019   Influenza, High Dose Seasonal PF 02/04/2015, 01/11/2017, 01/30/2018, 02/05/2020   Influenza-Unspecified 02/16/2022   Moderna SARS-COV2 Booster Vaccination 02/24/2022   Moderna Sars-Covid-2 Vaccination 05/09/2019, 05/27/2019, 03/03/2020, 09/04/2020, 11/04/2020   Pfizer Covid-19 Vaccine Bivalent Booster 37yrs & up 02/09/2021   Pneumococcal Conjugate-13 08/30/2013   Pneumococcal Polysaccharide-23 11/03/2005   RSV,unspecified 04/22/2022   Td 08/01/2001, 09/05/2019   Tdap 09/22/2010, 11/24/2020   Zoster Recombinat (Shingrix) 11/08/2016, 01/11/2017   Zoster, Live 11/04/2005, 11/08/2016, 01/11/2017   Zoster, Unspecified 01/11/2017   Pertinent  Health Maintenance Due  Topic Date Due   INFLUENZA VACCINE  11/24/2022   DEXA SCAN  Completed  10/24/2020   11:31 AM 04/15/2022   10:15 AM 04/27/2022   11:35 AM  05/31/2022    9:34 AM 07/26/2022    9:31 AM  Fall Risk  Falls in the past year?  0 0 0 0  Was there an injury with Fall?  0 0 0 0  Fall Risk Category Calculator  0 0 0 0  Fall Risk Category (Retired)  Low Low    (RETIRED) Patient Fall Risk Level Moderate fall risk Moderate fall risk Moderate fall risk    Patient at Risk for Falls Due to  History of fall(s) History of fall(s) History of fall(s)   Fall risk Follow up  Falls evaluation completed Falls evaluation completed Falls evaluation completed    Functional Status Survey:    Vitals:   09/05/22 1111  BP: 129/82  Pulse: 89  Resp: 19  Temp: (!) 96.8 F (36 C)  SpO2: 95%   There is no height or weight on file to calculate BMI. Physical Exam Vitals and nursing note reviewed.  Constitutional:      Appearance: Normal appearance. She is obese.  HENT:     Head: Normocephalic and atraumatic.     Nose: Nose normal.     Mouth/Throat:     Mouth: Mucous membranes are moist.  Eyes:     Extraocular Movements: Extraocular movements intact.     Conjunctiva/sclera: Conjunctivae normal.     Pupils: Pupils are equal, round, and reactive to light.  Cardiovascular:     Rate and Rhythm: Normal rate and regular rhythm.     Heart sounds: No murmur heard. Pulmonary:     Effort: Pulmonary effort is normal.     Breath sounds: No rales.  Abdominal:     General: Bowel sounds are normal.     Palpations: Abdomen is soft.     Tenderness: There is no abdominal tenderness. There is no right CVA tenderness, left CVA tenderness or guarding.  Genitourinary:    General: Normal vulva.     Vagina: No vaginal discharge.     Rectum: Normal.     Comments: Urogenital itching upon urination w/o skin irritation or rash. Musculoskeletal:     Cervical back: Normal range of motion and neck supple.     Right lower leg: No edema.     Left lower leg: No edema.     Comments: R hip, prosthetic hip removed.   Skin:    General: Skin is warm and dry.     Findings:  Erythema present.     Comments: Right hip surgical scar, right hip region resolved redness, warmth, swelling, currently drainage opening present. Redness in the mid to lower back skin folds-better.   Neurological:     General: No focal deficit present.     Mental Status: She is alert and oriented to person, place, and time.     Gait: Gait normal.  Psychiatric:        Mood and Affect: Mood normal.        Behavior: Behavior normal.        Thought Content: Thought content normal.        Judgment: Judgment normal.     Labs reviewed: Recent Labs    07/04/22 0502 07/05/22 0538 07/06/22 0702 07/08/22 1714 07/12/22 0437 07/13/22 0412 07/14/22 0444 07/21/22 0000 08/18/22 0000 08/23/22 0000  NA 134* 135 136   < > 133* 130* 132* 132* 139 139  K 4.2 3.7 3.4*   < > 3.0* 3.5 3.3*  3.7 3.3* 3.7  CL 104 106 109   < > 102 101 99 101 105 104  CO2 20* 19* 19*   < > 20* 20* 22 23* 27* 28*  GLUCOSE 96 94 106*   < > 113* 103* 104*  --   --   --   BUN 25* 24* 20   < > 20 22 21 19 15 18   CREATININE 1.21* 1.14* 1.01*   < > 1.21* 1.28* 1.41* 1.3* 1.2* 1.3*  CALCIUM 8.2* 8.4* 8.4*   < > 7.7* 8.2* 8.3* 8.3* 8.5* 8.3*  MG 2.4 2.4 2.4  --  1.8  --   --   --   --   --   PHOS 3.6 4.3 3.6  --   --   --   --   --   --   --    < > = values in this interval not displayed.   Recent Labs    07/04/22 0502 07/05/22 0538 07/06/22 0702 07/21/22 0000 08/18/22 0000  AST 23 30 36 39* 22  ALT 17 21 25  48* 18  ALKPHOS 71 66 63 64 85  BILITOT 0.6 0.6 0.3  --   --   PROT 6.2* 6.3* 6.4*  --   --   ALBUMIN 2.2* 2.2* 2.3* 3.0* 2.9*   Recent Labs    07/06/22 0702 07/08/22 1714 07/08/22 2208 07/09/22 0353 07/11/22 0411 07/13/22 0412 07/21/22 0000  WBC 11.9* 15.6*   < > 17.2* 12.8* 9.5 6.4  NEUTROABS 9.5* 12.1*  --   --   --   --  4,230.00  HGB 10.8* 11.7*   < > 11.4* 11.7* 11.9* 11.7*  HCT 37.0 38.5   < > 38.0 39.4 39.9 36  MCV 91.8 90.2   < > 90.9 89.1 89.7  --   PLT 536* 607*   < > 604* 601* 466* 221    < > = values in this interval not displayed.   Lab Results  Component Value Date   TSH 4.51 06/30/2021   No results found for: "HGBA1C" No results found for: "CHOL", "HDL", "LDLCALC", "LDLDIRECT", "TRIG", "CHOLHDL"  Significant Diagnostic Results in last 30 days:  No results found.  Assessment/Plan: Dysuria  c/o urinary symptoms-urogenital itching per the patient, no apparent skin irritation or rash present upon my examination, denied lower abd pain, nausea, vomiting, or fever/chill. Obtain UA C/S, CBC/diff, CMP/eGFR stat in setting of of infected joint, sepsis.   Muscle spasm  c/o lower back muscle spasm, state its not new, hx of inj for the treatment, will try Robaxin 500mg  q8hr x 24hrs, then prn.   Absence of hip joint, right  s/p right THR 2018, removal of hardware 12/29/20,  takes Tylenol, Gabapentin. F/u Ortho. TDWB. Power wc. 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 Fully treated, then off antibiotics.   Acquired hypothyroidism takes Levothyroxine, TSH 3.48 06/28/22    Chronic diastolic CHF (congestive heart failure) (HCC)  dependent  areas, takes Furosemide, Spironolactone  GERD (gastroesophageal reflux disease) better on  Pantoprazole, off  Omeprazole, prn Zofran. Hgb 11.7 08/18/22  Insomnia secondary to depression with anxiety  stable, off Mirtazapine.   Essential hypertension only on Furosemide, Spironolactone, controlled blood pressure.   Blood loss anemia post op, s/p EGD no active bleed. S/p 6 u PRBC transfusion, ASA was dc'd, On Fe, Iron 15 in hospital, Hgb 11.7 07/21/22    Family/ staff Communication: plan of care reviewed with the patient/the patient's  husband and charge nurse   Labs/tests ordered:  UA C/S, CBC/diff, CMP/eGFR  Time spend 35 minutes.

## 2022-09-05 NOTE — Assessment & Plan Note (Signed)
stable, off Mirtazapine.

## 2022-09-05 NOTE — Assessment & Plan Note (Signed)
c/o urinary symptoms-urogenital itching per the patient, no apparent skin irritation or rash present upon my examination, denied lower abd pain, nausea, vomiting, or fever/chill. Obtain UA C/S, CBC/diff, CMP/eGFR stat in setting of of infected joint, sepsis.

## 2022-09-05 NOTE — Assessment & Plan Note (Signed)
better on  Pantoprazole, off  Omeprazole, prn Zofran. Hgb 11.7 08/18/22

## 2022-09-05 NOTE — Assessment & Plan Note (Signed)
post op, s/p EGD no active bleed. S/p 6 u PRBC transfusion, ASA was dc'd, On Fe, Iron 15 in hospital, Hgb 11.7 07/21/22

## 2022-09-05 NOTE — Assessment & Plan Note (Signed)
takes Levothyroxine, TSH 3.48 06/28/22 

## 2022-09-21 ENCOUNTER — Encounter: Payer: Self-pay | Admitting: Nurse Practitioner

## 2022-09-21 ENCOUNTER — Non-Acute Institutional Stay (SKILLED_NURSING_FACILITY): Payer: Medicare Other | Admitting: Nurse Practitioner

## 2022-09-21 DIAGNOSIS — D500 Iron deficiency anemia secondary to blood loss (chronic): Secondary | ICD-10-CM

## 2022-09-21 DIAGNOSIS — E039 Hypothyroidism, unspecified: Secondary | ICD-10-CM

## 2022-09-21 DIAGNOSIS — I10 Essential (primary) hypertension: Secondary | ICD-10-CM | POA: Diagnosis not present

## 2022-09-21 DIAGNOSIS — E871 Hypo-osmolality and hyponatremia: Secondary | ICD-10-CM

## 2022-09-21 DIAGNOSIS — M62838 Other muscle spasm: Secondary | ICD-10-CM

## 2022-09-21 DIAGNOSIS — I5032 Chronic diastolic (congestive) heart failure: Secondary | ICD-10-CM | POA: Diagnosis not present

## 2022-09-21 DIAGNOSIS — K2100 Gastro-esophageal reflux disease with esophagitis, without bleeding: Secondary | ICD-10-CM

## 2022-09-21 NOTE — Assessment & Plan Note (Signed)
dependent  areas, takes Furosemide, Spironolactone 

## 2022-09-21 NOTE — Assessment & Plan Note (Signed)
better on  Pantoprazole, off  Omeprazole, prn Zofran. Hgb 10.4 09/05/22

## 2022-09-21 NOTE — Assessment & Plan Note (Signed)
Blood pressure is controlled, only on Furosemide, Spironolactone.  

## 2022-09-21 NOTE — Assessment & Plan Note (Signed)
Na 137 09/05/22

## 2022-09-21 NOTE — Progress Notes (Signed)
Location:   SNF [facility] Room Number: 19 Place of Service:  SNF (31) Provider: Arna Snipe Fermina Mishkin NP  Heffner, Jimmye Norman, MD  Patient Care Team: Heffner, Jimmye Norman, MD as PCP - General (Family Medicine) [name], MD as PCP - Cardiology (Cardiology) [name], MD as PCP - Sleep Medicine (Cardiology) [name], MD (Sports Medicine) [name], MD (Ophthalmology) [name], [name], MD (Dermatology) [name], MD (Gastroenterology) [name], MD (Nephrology) [name], DC as Referring Physician (Chiropractic Medicine)  Extended Emergency Contact Information Primary Emergency Contact: [facility] Address: [address]          [place], [place] [address] [name] of [place] Home Phone: [phone] Mobile Phone: [phone] Relation: Spouse  Code Status:  DNR Goals of care: Advanced Directive information    08/25/2022    9:14 AM  Advanced Directives  Does Patient Have a Medical Advance Directive? Yes  Type of Advance Directive Living will;Out of facility DNR (pink MOST or yellow form)  Does patient want to make changes to medical advance directive? No - Patient declined  Pre-existing out of facility DNR order (yellow form or pink MOST form) Yellow form placed in chart (order not valid for inpatient use)     Chief Complaint  Patient presents with   Medical Management of Chronic Issues    HPI:  Pt is a 82 y.o. female seen today for medical management of chronic diseases.      Hospitalized 07/08/22 for CHF, abscess of the right hip which previous infected, treated, and prosthesis was removed and never replaced. Open drainage site presents.               Hospitalized 07/02/22-07/07/22 for the right hip cellulitis, CT hip in hospital fluid collection unclear if it communicates with the joint space, Ortho not felt to be related to the joint space and may be an old fluid collection, aspiration not  recommended.              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 F/u Ortho. TDWB. Power wc. 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 Fully treated, then off antibiotics.               Elevated AST/ALT normalized, S/p cholecystectomy, [place] 02/19/21 no cyst or mass.              Anemia, post op, s/p EGD no active bleed. S/p 6 u PRBC transfusion, ASA was dc'd, On Fe, Iron 15 in hospital, Hgb 10.4 09/05/22   Muscle spasm, controlled, takes Gabapentin.              HTN only on Furosemide, Spironolactone.              Morbid obesity             OSA CPAP             Chronic diarrhea, prn Imodium, Align             RA f/u Rheumatology, hx of Plaquenil use, stable presently.              Insomnia/depression/anxiety, stable, off Mirtazapine.              GERD, better on  Pantoprazole, off  Omeprazole, prn Zofran. Hgb 10.4 09/05/22             CHF/Edema, dependent  areas, takes Furosemide,  Spironolactone CKD stage 3 Bun/creat 17/1.22 09/05/22 Hyponatremia, Na 137 09/05/22             Hypothyroidism, takes Levothyroxine, TSH 3.48 06/28/22     Past Medical History:  Diagnosis Date   Benign hypertension with chronic kidney disease, stage III (HCC)    Overview:  Last Assessment & Plan:  Usually the patient is hypertensive. However today her blood pressure is 105 systolic. She has been feeling fatigued with some lightheadedness. Part of this may be related to her lower blood pressure. Her pressure may be improved with her decrease in salt and fluid intake. I've instructed her to put her lisinopril/hctz on hold. I've asked her to see her primary    Cardiac disease 03/10/2014   Chronic diarrhea 07/27/2015   Edema 07/27/2015   Ejection fraction 2004   Normal, echo,    Gastroesophageal reflux disease    GERD (gastroesophageal reflux disease)    Heart disease 03/10/2014   Hypertension    Left bundle branch block 09/01/2020   Obesity     Obesity (BMI 30-39.9) 09/25/2017   OSA (obstructive sleep apnea)    mild with AHI 6.75 - on CPAP   Preop cardiovascular exam 11/2010   Cardiac clearance for knee surgery    Sleep apnea 03/10/2014   Supraventricular tachycardia    Documented episode in the past, possibly reentrant tachycardia  Overview:  Overview:  Documented episode in the past, 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 It was possibly reentrant. She does well with diltiazem. No change in therapy.   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   Thyroid disease    Urinary incontinence    Past Surgical History:  Procedure Laterality Date   CATARACT EXTRACTION Left 8/11   CATARACT EXTRACTION Right 3/12   CHOLECYSTECTOMY  1994   Copsilotomy Laser Treatment Left 6/12   eye   ELBOW SURGERY  8/12   EYE SURGERY Left 09/2008   macular hole   EYE SURGERY Right 12/11   Lumbar Infusion  2011   MOLE REMOVAL  06/17/2015   TOTAL KNEE ARTHROPLASTY Left 6/11   TOTAL KNEE ARTHROPLASTY Right 06/13/2011    Allergies  Allergen Reactions   Keflex [Cephalexin] Diarrhea   Macrobid [Nitrofurantoin] Hives   Amoxicillin Rash    Allergies as of 09/21/2022       Reactions   Keflex [cephalexin] Diarrhea   Macrobid [nitrofurantoin] Hives   Amoxicillin Rash        Medication List        Accurate as of Sep 21, 2022  4:16 PM. If you have any questions, ask your nurse or doctor.          acetaminophen 325 MG tablet Commonly known as: TYLENOL Take 650 mg by mouth every 6 (six) hours as needed for moderate pain.   albuterol 108 (90 Base) MCG/ACT inhaler Commonly known as: VENTOLIN HFA Inhale 2 puffs into the lungs every 6 (six) hours as needed for wheezing or shortness of breath.   bifidobacterium infantis capsule Take 1 capsule by mouth daily.   CALCIUM PLUS VITAMIN  D3 PO Take 1 tablet by mouth daily. Vitamin D3 + Calcium 600   ferrous sulfate 325 (65 FE) MG EC tablet Take 325 mg by mouth as directed. Once A Day on Mon, Thu   furosemide 40 MG tablet Commonly known as:  LASIX Take 40 mg by mouth 2 (two) times daily.   gabapentin 300 MG capsule Commonly known as: NEURONTIN Take 300 mg by mouth at bedtime.   ipratropium-albuterol 0.5-2.5 (3) MG/3ML Soln Commonly known as: DUONEB Inhale 3 mLs into the lungs every 8 (eight) hours as needed (wheezing).   levothyroxine 25 MCG tablet Commonly known as: SYNTHROID Give 1 tablet orally one time a day every 2 day(s); 2 tablets (50 mcg) one time a day every 2 day(s)   loperamide 2 MG capsule Commonly known as: IMODIUM Take 2 mg by mouth every 4 (four) hours as needed for diarrhea or loose stools.   nystatin powder Commonly known as: MYCOSTATIN/NYSTOP Apply 1 application  topically in the morning, at noon, in the evening, and at bedtime. To genital areas and inner thigh   ondansetron 4 MG disintegrating tablet Commonly known as: ZOFRAN-ODT Take 4 mg by mouth every 8 (eight) hours as needed for nausea or vomiting.   potassium chloride SA 20 MEQ tablet Commonly known as: KLOR-CON M Take 20 mEq by mouth daily.   Protonix 40 MG tablet Generic drug: pantoprazole Take 40 mg by mouth daily.   spironolactone 25 MG tablet Commonly known as: ALDACTONE Take 1 tablet (25 mg total) by mouth daily.   Vitamin D3 50 MCG (2000 UT) Tabs Take 2 tablets by mouth daily.        Review of Systems  Constitutional:  Negative for appetite change, fatigue and fever.  HENT:  Negative for congestion, sore throat and trouble swallowing.   Eyes:  Negative for visual disturbance.  Respiratory:  Negative for cough, shortness of breath and wheezing.   Cardiovascular:  Negative for leg swelling.  Gastrointestinal:  Negative for abdominal pain, constipation and nausea.  Genitourinary:  Negative for dysuria,  frequency, genital sores, urgency, vaginal bleeding and vaginal discharge.       Urogenital itching upon urination w/o skin irritation or rash.  Musculoskeletal:  Positive for arthralgias and gait problem.       Lower back muscle spasm is better controlled  Skin:  Positive for wound. Negative for color change and rash.       Resolved ight hip region redness, warmth, swelling, currently drainage opening present. Improved rash in the mid to lower back skin folds.   Neurological:  Negative for speech difficulty and weakness.  Psychiatric/Behavioral:  Negative for confusion and sleep disturbance. The patient is not nervous/anxious.     Immunization History  Administered Date(s) Administered   Influenza Split 01/28/2008, 02/17/2009, 02/20/2012, 02/11/2013, 02/02/2016, 01/11/2017, 02/04/2019   Influenza, High Dose Seasonal PF 02/04/2015, 01/11/2017, 01/30/2018, 02/05/2020   Influenza-Unspecified 02/16/2022   Moderna SARS-COV2 Booster Vaccination 02/24/2022   Moderna Sars-Covid-2 Vaccination 05/09/2019, 05/27/2019, 03/03/2020, 09/04/2020, 11/04/2020   Pfizer Covid-19 Vaccine Bivalent Booster 15yrs & up 02/09/2021   Pneumococcal Conjugate-13 08/30/2013   Pneumococcal Polysaccharide-23 11/03/2005   RSV,unspecified 04/22/2022   Td 08/01/2001, 09/05/2019   Tdap 09/22/2010, 11/24/2020   Zoster Recombinat (Shingrix) 11/08/2016, 01/11/2017   Zoster, Live 11/04/2005, 11/08/2016, 01/11/2017   Zoster, Unspecified 01/11/2017   Pertinent  Health Maintenance Due  Topic Date Due   INFLUENZA VACCINE  11/24/2022   DEXA SCAN  Completed      10/24/2020   11:31 AM 04/15/2022   10:15 AM 04/27/2022   11:35 AM 05/31/2022    9:34 AM 07/26/2022    9:31 AM  Fall Risk  Falls in the past year?  0 0 0 0  Was there an injury with Fall?  0 0 0 0  Fall Risk Category Calculator  0 0 0 0  Fall Risk Category (Retired)  Low Low    (RETIRED) Patient Fall Risk Level Moderate fall risk Moderate fall risk Moderate fall risk     Patient at Risk for Falls Due to  History of fall(s) History of fall(s) History of fall(s)   Fall risk Follow up  Falls evaluation completed Falls evaluation completed Falls evaluation completed    Functional Status Survey:    Vitals:   09/21/22 1250  BP: 125/70  Pulse: 70  Resp: 18  Temp: 97.8 F (36.6 C)  SpO2: 94%  Weight: 292 lb (132.5 kg)   Body mass index is 44.4 kg/m. Physical Exam Vitals and nursing note reviewed.  Constitutional:      Appearance: Normal appearance. She is obese.  HENT:     Head: Normocephalic and atraumatic.     Nose: Nose normal.     Mouth/Throat:     Mouth: Mucous membranes are moist.  Eyes:     Extraocular Movements: Extraocular movements intact.     Conjunctiva/sclera: Conjunctivae normal.     Pupils: Pupils are equal, round, and reactive to light.  Cardiovascular:     Rate and Rhythm: Normal rate and regular rhythm.     Heart sounds: No murmur heard. Pulmonary:     Effort: Pulmonary effort is normal.     Breath sounds: No rales.  Abdominal:     General: Bowel sounds are normal.     Palpations: Abdomen is soft.     Tenderness: There is no abdominal tenderness. There is no right CVA tenderness, left CVA tenderness or guarding.  Genitourinary:    General: Normal vulva.     Vagina: No vaginal discharge.     Rectum: Normal.     Comments: Urogenital itching upon urination w/o skin irritation or rash. Musculoskeletal:     Cervical back: Normal range of motion and neck supple.     Right lower leg: No edema.     Left lower leg: No edema.     Comments: R hip, prosthetic hip removed.   Skin:    General: Skin is warm and dry.     Findings: Erythema present.     Comments: Right hip surgical scar, right hip region resolved redness, warmth, swelling, currently drainage opening present. Redness in the mid to lower back skin folds-better.   Neurological:     General: No focal deficit present.     Mental Status: She is alert and oriented to  person, place, and time.     Gait: Gait normal.  Psychiatric:        Mood and Affect: Mood normal.        Behavior: Behavior normal.        Thought Content: Thought content normal.        Judgment: Judgment normal.     Labs reviewed: Recent Labs    07/04/22 0502 07/05/22 0538 07/06/22 0702 07/08/22 1714 07/12/22 0437 07/13/22 0412 07/14/22 0444 07/21/22 0000 08/18/22 0000 08/23/22 0000  NA 134* 135 136   < > 133* 130* 132* 132* 139 139  K 4.2 3.7 3.4*   < > 3.0* 3.5 3.3* 3.7 3.3* 3.7  CL 104 106 109   < > 102 101 99 101 105 104  CO2 20* 19* 19*   < > 20* 20* 22 23* 27* 28*  GLUCOSE 96 94 106*   < > 113* 103* 104*  --   --   --  BUN 25* 24* 20   < > 20 22 21 19 15 18   CREATININE 1.21* 1.14* 1.01*   < > 1.21* 1.28* 1.41* 1.3* 1.2* 1.3*  CALCIUM 8.2* 8.4* 8.4*   < > 7.7* 8.2* 8.3* 8.3* 8.5* 8.3*  MG 2.4 2.4 2.4  --  1.8  --   --   --   --   --   PHOS 3.6 4.3 3.6  --   --   --   --   --   --   --    < > = values in this interval not displayed.   Recent Labs    07/04/22 0502 07/05/22 0538 07/06/22 0702 07/21/22 0000 08/18/22 0000  AST 23 30 36 39* 22  ALT 17 21 25  48* 18  ALKPHOS 71 66 63 64 85  BILITOT 0.6 0.6 0.3  --   --   PROT 6.2* 6.3* 6.4*  --   --   ALBUMIN 2.2* 2.2* 2.3* 3.0* 2.9*   Recent Labs    07/06/22 0702 07/08/22 1714 07/08/22 2208 07/09/22 0353 07/11/22 0411 07/13/22 0412 07/21/22 0000  WBC 11.9* 15.6*   < > 17.2* 12.8* 9.5 6.4  NEUTROABS 9.5* 12.1*  --   --   --   --  4,230.00  HGB 10.8* 11.7*   < > 11.4* 11.7* 11.9* 11.7*  HCT 37.0 38.5   < > 38.0 39.4 39.9 36  MCV 91.8 90.2   < > 90.9 89.1 89.7  --   PLT 536* 607*   < > 604* 601* 466* 221   < > = values in this interval not displayed.   Lab Results  Component Value Date   TSH 4.51 06/30/2021   No results found for: "HGBA1C" No results found for: "CHOL", "HDL", "LDLCALC", "LDLDIRECT", "TRIG", "CHOLHDL"  Significant Diagnostic Results in last 30 days:  No results  found.  Assessment/Plan  Essential hypertension Blood pressure is controlled,  only on Furosemide, Spironolactone.   GERD (gastroesophageal reflux disease) better on  Pantoprazole, off  Omeprazole, prn Zofran. Hgb 10.4 09/05/22  Chronic diastolic CHF (congestive heart failure) (HCC) dependent  areas, takes Furosemide, Spironolactone  Hyponatremia Na 137 09/05/22  Acquired hypothyroidism takes Levothyroxine, TSH 3.48 06/28/22  Muscle spasm controlled, takes Gabapentin.   Blood loss anemia  post op, s/p EGD no active bleed. S/p 6 u PRBC transfusion, ASA was dc'd, On Fe, Iron 15 in hospital, Hgb 10.4 09/05/22    Family/ staff Communication: plan of care reviewed with the patient and charge nurse.   Labs/tests ordered:  none  Time spend 35 minutes.

## 2022-09-21 NOTE — Assessment & Plan Note (Signed)
post op, s/p EGD no active bleed. S/p 6 u PRBC transfusion, ASA was dc'd, On Fe, Iron 15 in hospital, Hgb 10.4 09/05/22

## 2022-09-21 NOTE — Assessment & Plan Note (Signed)
takes Levothyroxine, TSH 3.48 06/28/22 

## 2022-09-21 NOTE — Assessment & Plan Note (Signed)
controlled, takes Gabapentin.

## 2022-10-13 ENCOUNTER — Non-Acute Institutional Stay (SKILLED_NURSING_FACILITY): Payer: Medicare Other | Admitting: Nurse Practitioner

## 2022-10-13 ENCOUNTER — Emergency Department (HOSPITAL_COMMUNITY): Payer: Medicare Other

## 2022-10-13 ENCOUNTER — Encounter: Payer: Self-pay | Admitting: Nurse Practitioner

## 2022-10-13 ENCOUNTER — Inpatient Hospital Stay (HOSPITAL_COMMUNITY)
Admission: EM | Admit: 2022-10-13 | Discharge: 2022-10-19 | DRG: 683 | Disposition: A | Discharge status: Skilled Nursing Facility | Payer: Medicare Other | Source: Skilled Nursing Facility | Attending: Internal Medicine | Admitting: Internal Medicine

## 2022-10-13 ENCOUNTER — Encounter (HOSPITAL_COMMUNITY): Payer: Self-pay

## 2022-10-13 ENCOUNTER — Other Ambulatory Visit: Payer: Self-pay

## 2022-10-13 DIAGNOSIS — E039 Hypothyroidism, unspecified: Secondary | ICD-10-CM

## 2022-10-13 DIAGNOSIS — F418 Other specified anxiety disorders: Secondary | ICD-10-CM

## 2022-10-13 DIAGNOSIS — K529 Noninfective gastroenteritis and colitis, unspecified: Secondary | ICD-10-CM | POA: Diagnosis present

## 2022-10-13 DIAGNOSIS — I10 Essential (primary) hypertension: Secondary | ICD-10-CM | POA: Diagnosis present

## 2022-10-13 DIAGNOSIS — D72829 Elevated white blood cell count, unspecified: Secondary | ICD-10-CM | POA: Diagnosis present

## 2022-10-13 DIAGNOSIS — N189 Chronic kidney disease, unspecified: Secondary | ICD-10-CM | POA: Diagnosis present

## 2022-10-13 DIAGNOSIS — Z79899 Other long term (current) drug therapy: Secondary | ICD-10-CM

## 2022-10-13 DIAGNOSIS — Z1152 Encounter for screening for COVID-19: Secondary | ICD-10-CM

## 2022-10-13 DIAGNOSIS — Z8249 Family history of ischemic heart disease and other diseases of the circulatory system: Secondary | ICD-10-CM

## 2022-10-13 DIAGNOSIS — D500 Iron deficiency anemia secondary to blood loss (chronic): Secondary | ICD-10-CM | POA: Diagnosis not present

## 2022-10-13 DIAGNOSIS — Z888 Allergy status to other drugs, medicaments and biological substances status: Secondary | ICD-10-CM

## 2022-10-13 DIAGNOSIS — Z881 Allergy status to other antibiotic agents status: Secondary | ICD-10-CM

## 2022-10-13 DIAGNOSIS — N1831 Chronic kidney disease, stage 3a: Secondary | ICD-10-CM | POA: Diagnosis present

## 2022-10-13 DIAGNOSIS — R5381 Other malaise: Secondary | ICD-10-CM | POA: Diagnosis not present

## 2022-10-13 DIAGNOSIS — F419 Anxiety disorder, unspecified: Secondary | ICD-10-CM | POA: Diagnosis present

## 2022-10-13 DIAGNOSIS — Z9841 Cataract extraction status, right eye: Secondary | ICD-10-CM

## 2022-10-13 DIAGNOSIS — Z6841 Body Mass Index (BMI) 40.0 and over, adult: Secondary | ICD-10-CM

## 2022-10-13 DIAGNOSIS — K219 Gastro-esophageal reflux disease without esophagitis: Secondary | ICD-10-CM | POA: Diagnosis present

## 2022-10-13 DIAGNOSIS — Z89621 Acquired absence of right hip joint: Secondary | ICD-10-CM | POA: Diagnosis present

## 2022-10-13 DIAGNOSIS — M069 Rheumatoid arthritis, unspecified: Secondary | ICD-10-CM

## 2022-10-13 DIAGNOSIS — E860 Dehydration: Secondary | ICD-10-CM | POA: Diagnosis present

## 2022-10-13 DIAGNOSIS — E871 Hypo-osmolality and hyponatremia: Secondary | ICD-10-CM

## 2022-10-13 DIAGNOSIS — N179 Acute kidney failure, unspecified: Principal | ICD-10-CM | POA: Diagnosis present

## 2022-10-13 DIAGNOSIS — K2100 Gastro-esophageal reflux disease with esophagitis, without bleeding: Secondary | ICD-10-CM

## 2022-10-13 DIAGNOSIS — I5032 Chronic diastolic (congestive) heart failure: Secondary | ICD-10-CM | POA: Diagnosis not present

## 2022-10-13 DIAGNOSIS — Z96653 Presence of artificial knee joint, bilateral: Secondary | ICD-10-CM | POA: Diagnosis present

## 2022-10-13 DIAGNOSIS — M62838 Other muscle spasm: Secondary | ICD-10-CM | POA: Diagnosis not present

## 2022-10-13 DIAGNOSIS — R5383 Other fatigue: Secondary | ICD-10-CM

## 2022-10-13 DIAGNOSIS — Z9049 Acquired absence of other specified parts of digestive tract: Secondary | ICD-10-CM

## 2022-10-13 DIAGNOSIS — Z79890 Hormone replacement therapy (postmenopausal): Secondary | ICD-10-CM

## 2022-10-13 DIAGNOSIS — G4733 Obstructive sleep apnea (adult) (pediatric): Secondary | ICD-10-CM | POA: Diagnosis present

## 2022-10-13 DIAGNOSIS — F32A Depression, unspecified: Secondary | ICD-10-CM | POA: Diagnosis present

## 2022-10-13 DIAGNOSIS — Z9842 Cataract extraction status, left eye: Secondary | ICD-10-CM

## 2022-10-13 DIAGNOSIS — I130 Hypertensive heart and chronic kidney disease with heart failure and stage 1 through stage 4 chronic kidney disease, or unspecified chronic kidney disease: Secondary | ICD-10-CM | POA: Diagnosis present

## 2022-10-13 DIAGNOSIS — F5105 Insomnia due to other mental disorder: Secondary | ICD-10-CM

## 2022-10-13 DIAGNOSIS — G4730 Sleep apnea, unspecified: Secondary | ICD-10-CM | POA: Diagnosis present

## 2022-10-13 DIAGNOSIS — Z87891 Personal history of nicotine dependence: Secondary | ICD-10-CM

## 2022-10-13 LAB — URINALYSIS, ROUTINE W REFLEX MICROSCOPIC
Bilirubin Urine: NEGATIVE
Glucose, UA: NEGATIVE mg/dL
Ketones, ur: NEGATIVE mg/dL
Nitrite: NEGATIVE
Protein, ur: NEGATIVE mg/dL
Specific Gravity, Urine: 1.005 (ref 1.005–1.030)
pH: 6 (ref 5.0–8.0)

## 2022-10-13 LAB — COMPREHENSIVE METABOLIC PANEL
ALT: 17 U/L (ref 0–44)
AST: 20 U/L (ref 15–41)
Albumin: 2.7 g/dL — ABNORMAL LOW (ref 3.5–5.0)
Alkaline Phosphatase: 85 U/L (ref 38–126)
Anion gap: 13 (ref 5–15)
BUN: 57 mg/dL — ABNORMAL HIGH (ref 8–23)
CO2: 20 mmol/L — ABNORMAL LOW (ref 22–32)
Calcium: 8.2 mg/dL — ABNORMAL LOW (ref 8.9–10.3)
Chloride: 98 mmol/L (ref 98–111)
Creatinine, Ser: 3.35 mg/dL — ABNORMAL HIGH (ref 0.44–1.00)
GFR, Estimated: 13 mL/min — ABNORMAL LOW (ref >60)
Glucose, Bld: 122 mg/dL — ABNORMAL HIGH (ref 70–99)
Potassium: 4.4 mmol/L (ref 3.5–5.1)
Sodium: 131 mmol/L — ABNORMAL LOW (ref 135–145)
Total Bilirubin: 1.1 mg/dL (ref 0.3–1.2)
Total Protein: 7 g/dL (ref 6.5–8.1)

## 2022-10-13 LAB — CBC
HCT: 39 % (ref 36.0–46.0)
Hemoglobin: 12.4 g/dL (ref 12.0–15.0)
MCH: 28.1 pg (ref 26.0–34.0)
MCHC: 31.8 g/dL (ref 30.0–36.0)
MCV: 88.4 fL (ref 80.0–100.0)
Platelets: 189 10*3/uL (ref 150–400)
RBC: 4.41 MIL/uL (ref 3.87–5.11)
RDW: 14.1 % (ref 11.5–15.5)
WBC: 15.3 10*3/uL — ABNORMAL HIGH (ref 4.0–10.5)
nRBC: 0 % (ref 0.0–0.2)

## 2022-10-13 LAB — SARS CORONAVIRUS 2 BY RT PCR: SARS Coronavirus 2 by RT PCR: NEGATIVE

## 2022-10-13 MED ORDER — SODIUM CHLORIDE 0.9 % IV BOLUS (SEPSIS)
500.0000 mL | Freq: Once | INTRAVENOUS | Status: AC
  Administered 2022-10-13: 500 mL via INTRAVENOUS

## 2022-10-13 MED ORDER — SODIUM CHLORIDE 0.9 % IV BOLUS: 500.0000 mL | Freq: Once | INTRAVENOUS | Status: DC

## 2022-10-13 MED ORDER — SENNOSIDES-DOCUSATE SODIUM 8.6-50 MG PO TABS: 1.0000 | ORAL_TABLET | Freq: Every evening | ORAL | Status: DC | PRN

## 2022-10-13 MED ORDER — PANTOPRAZOLE SODIUM 40 MG PO TBEC
40.0000 mg | DELAYED_RELEASE_TABLET | Freq: Every day | ORAL | Status: DC
  Administered 2022-10-14 – 2022-10-19 (×5): 40 mg via ORAL
  Filled 2022-10-13 (×5): qty 1

## 2022-10-13 MED ORDER — LEVOTHYROXINE SODIUM 25 MCG PO TABS
25.0000 ug | ORAL_TABLET | ORAL | Status: DC
  Administered 2022-10-15 – 2022-10-19 (×3): 25 ug via ORAL
  Filled 2022-10-13 (×3): qty 1

## 2022-10-13 MED ORDER — HEPARIN SODIUM (PORCINE) 5000 UNIT/ML IJ SOLN
5000.0000 [IU] | Freq: Three times a day (TID) | INTRAMUSCULAR | Status: DC
  Administered 2022-10-13 – 2022-10-19 (×17): 5000 [IU] via SUBCUTANEOUS
  Filled 2022-10-13 (×16): qty 1

## 2022-10-13 MED ORDER — ALBUTEROL SULFATE (2.5 MG/3ML) 0.083% IN NEBU
2.5000 mg | INHALATION_SOLUTION | RESPIRATORY_TRACT | Status: DC | PRN
  Administered 2022-10-15: 2.5 mg via RESPIRATORY_TRACT
  Filled 2022-10-13 (×2): qty 3

## 2022-10-13 MED ORDER — ONDANSETRON HCL 4 MG PO TABS: 4.0000 mg | ORAL_TABLET | Freq: Four times a day (QID) | ORAL | Status: DC | PRN

## 2022-10-13 MED ORDER — LEVOTHYROXINE SODIUM 25 MCG PO TABS: 25.0000 ug | ORAL_TABLET | ORAL | Status: DC

## 2022-10-13 MED ORDER — LEVOTHYROXINE SODIUM 50 MCG PO TABS
50.0000 ug | ORAL_TABLET | ORAL | Status: DC
  Administered 2022-10-14 – 2022-10-18 (×3): 50 ug via ORAL
  Filled 2022-10-13 (×4): qty 1

## 2022-10-13 MED ORDER — ONDANSETRON HCL 4 MG/2ML IJ SOLN
4.0000 mg | Freq: Four times a day (QID) | INTRAMUSCULAR | Status: DC | PRN
  Administered 2022-10-13: 4 mg via INTRAVENOUS
  Filled 2022-10-13: qty 2

## 2022-10-13 MED ORDER — ACETAMINOPHEN 325 MG PO TABS: 650.0000 mg | ORAL_TABLET | Freq: Four times a day (QID) | ORAL | Status: DC | PRN

## 2022-10-13 MED ORDER — ACETAMINOPHEN 650 MG RE SUPP: 650.0000 mg | Freq: Four times a day (QID) | RECTAL | Status: DC | PRN

## 2022-10-13 MED ORDER — SODIUM CHLORIDE 0.9 % IV SOLN
1000.0000 mL | INTRAVENOUS | Status: DC
  Administered 2022-10-13: 1000 mL via INTRAVENOUS

## 2022-10-13 MED ORDER — GABAPENTIN 300 MG PO CAPS
300.0000 mg | ORAL_CAPSULE | Freq: Every day | ORAL | Status: DC
  Administered 2022-10-14 – 2022-10-18 (×5): 300 mg via ORAL
  Filled 2022-10-13 (×5): qty 1

## 2022-10-13 MED ORDER — SODIUM CHLORIDE 0.9 % IV SOLN
1000.0000 mL | INTRAVENOUS | Status: AC
  Administered 2022-10-13: 1000 mL via INTRAVENOUS

## 2022-10-13 NOTE — Assessment & Plan Note (Signed)
controlled, takes Gabapentin, prn Methocarbamol.

## 2022-10-13 NOTE — ED Provider Notes (Signed)
[place] EMERGENCY DEPARTMENT AT [facility] Provider Note   CSN: [id number] Arrival date & time: 10/13/22  1733     History  Chief Complaint  Patient presents with   Abnormal Labs    Madison [name] is a 82 y.o. female.  HPI   Patient has history of a couple medical problems including congestive heart failure infected right hip prosthesis that was removed and never replaced back in March of this year, hypertension, SVT, obstructive sleep apnea, chronic kidney disease, sepsis, rheumatoid arthritis who was sent to the ED for evaluation of abnormal lab values.  Patient has had general malaise since Sunday.  She felt like she has been wheezing.  She is also having nausea and vomiting and a few episodes of diarrhea.  Patient is a long-term resident of a nursing facility.  She states she had laboratory tests at the nursing facility and was told that her liver tests were abnormal  Home Medications Prior to Admission medications   Medication Sig Start Date End Date Taking? Authorizing Provider  acetaminophen (TYLENOL) 325 MG tablet Take 650 mg by mouth every 6 (six) hours as needed for moderate pain.    [provider]  albuterol (VENTOLIN HFA) 108 (90 Base) MCG/ACT inhaler Inhale 2 puffs into the lungs every 6 (six) hours as needed for wheezing or shortness of breath.    [provider]  bifidobacterium infantis (ALIGN) capsule Take 1 capsule by mouth daily.    [provider]  Calcium Carb-Cholecalciferol (CALCIUM PLUS VITAMIN D3 PO) Take 1 tablet by mouth daily. Vitamin D3 + Calcium 600    [provider]  Cholecalciferol (VITAMIN D3) 50 MCG (2000 UT) TABS Take 2 tablets by mouth daily.    [provider]  ferrous sulfate 325 (65 FE) MG EC tablet Take 325 mg by mouth as directed. Once A Day on Mon, Thu    [provider]  furosemide (LASIX) 40 MG tablet Take 40 mg by mouth 2 (two) times daily.    [provider]   gabapentin (NEURONTIN) 300 MG capsule Take 300 mg by mouth at bedtime. 03/02/20   [provider]  ipratropium-albuterol (DUONEB) 0.5-2.5 (3) MG/3ML SOLN Inhale 3 mLs into the lungs every 8 (eight) hours as needed (wheezing).    [provider]  levothyroxine (SYNTHROID) 25 MCG tablet Give 1 tablet orally one time a day every 2 day(s); 2 tablets (50 mcg) one time a day every 2 day(s)    [provider]  loperamide (IMODIUM) 2 MG capsule Take 2 mg by mouth every 4 (four) hours as needed for diarrhea or loose stools. 02/04/21   [provider]  nystatin (MYCOSTATIN/NYSTOP) powder Apply 1 application  topically in the morning, at noon, in the evening, and at bedtime. To genital areas and inner thigh    [provider]  ondansetron (ZOFRAN-ODT) 4 MG disintegrating tablet Take 4 mg by mouth every 8 (eight) hours as needed for nausea or vomiting. 02/04/21   [provider]  pantoprazole (PROTONIX) 40 MG tablet Take 40 mg by mouth daily.    [provider]  potassium chloride SA (KLOR-CON) 20 MEQ tablet Take 20 mEq by mouth daily.    [provider]  spironolactone (ALDACTONE) 25 MG tablet Take 1 tablet (25 mg total) by mouth daily. 07/15/22   Regalado, Prentiss Bells, MD      Allergies    Keflex [cephalexin], Macrobid [nitrofurantoin], and Amoxicillin    Review  of Systems   Review of Systems  Physical Exam Updated Vital Signs BP (!) 100/56 (BP Location: Left Arm)   Pulse 87   Temp 99.1 F (37.3 C) (Oral)   Resp 16   SpO2 95%  Physical Exam Vitals and nursing note reviewed.  Constitutional:      General: She is not in acute distress.    Appearance: She is well-developed.     Comments: Increased BMI  HENT:     Head: Normocephalic and atraumatic.     Right Ear: External ear normal.     Left Ear: External ear normal.  Eyes:     General: No scleral icterus.       Right eye: No discharge.        Left eye: No discharge.      Conjunctiva/sclera: Conjunctivae normal.  Neck:     Trachea: No tracheal deviation.  Cardiovascular:     Rate and Rhythm: Normal rate and regular rhythm.  Pulmonary:     Effort: Pulmonary effort is normal. No respiratory distress.     Breath sounds: Normal breath sounds. No stridor. No wheezing or rales.  Abdominal:     General: Bowel sounds are normal. There is no distension.     Palpations: Abdomen is soft.     Tenderness: There is no abdominal tenderness. There is no guarding or rebound.  Musculoskeletal:        General: No deformity.     Cervical back: Neck supple.     Right lower leg: No edema.     Left lower leg: No edema.     Comments: Deformity right hip  Skin:    General: Skin is warm and dry.     Findings: No rash.  Neurological:     Mental Status: She is alert.     Cranial Nerves: No cranial nerve deficit, dysarthria or facial asymmetry.     Sensory: No sensory deficit.     Motor: No abnormal muscle tone or seizure activity.     Comments: Generalized weakness  Psychiatric:        Mood and Affect: Mood normal.     ED Results / Procedures / Treatments   Labs (all labs ordered are listed, but only abnormal results are displayed) Labs Reviewed - No data to display  EKG None  Radiology No results found.  Procedures Procedures    Medications Ordered in ED Medications - No data to display  ED Course/ Medical Decision Making/ A&P Clinical Course as of 10/14/22 0935  Thu Oct 13, 2022  2012 Chest x-ray shows chronic cardiomegaly.  No acute abnormality [JK]  2012 CBC(!) CBC shows white count elevated 15.  Metabolic panel shows elevated BUN and creatinine [JK]  2108 Case reviewed with Dr Allena Katz [JK]    Clinical Course User Index [JK] Linwood Dibbles, MD                             Medical Decision Making Problems Addressed: AKI (acute kidney injury) [facility]): acute illness or injury that poses a threat to life or bodily functions Dehydration: acute illness or  injury that poses a threat to life or bodily functions  Amount and/or Complexity of Data Reviewed Labs: ordered. Decision-making details documented in ED Course. Radiology: ordered and independent interpretation performed.  Risk Decision regarding hospitalization.  Labs and records reviewed from the nursing facility.  Patient noted to have a BUN elevated at 54.  Creatinine  was increased to 3.39.  Calcium is decreased at 7.9 white blood cell count elevated 13.3.  Hemoglobin is 11.  Previous creatinine 1 month ago was 1.3  Labs today consistent with her outpatient labs.  Pt with new AKI.  Likely related to her recent nausea and vomiting, diarrhea.  NO abd ttp ,do not feel advanced imaging necessary at this time.  NO vomiting or diarrhea in the ED.  CHronic hip wound does not appear acutely infected.  CXR without signs of pna or chf.  Will plan on IV hydration, admit to hospital for further treatment.        Final Clinical Impression(s) / ED Diagnoses Final diagnoses:  AKI (acute kidney injury) (HCC)  Dehydration    Rx / DC Orders ED Discharge Orders     None         Linwood Dibbles, MD 10/14/22 [phone]

## 2022-10-13 NOTE — Assessment & Plan Note (Signed)
better on  Pantoprazole, off  Omeprazole, prn Zofran. Hgb 10.4 09/05/22 

## 2022-10-13 NOTE — Assessment & Plan Note (Signed)
stable, off Mirtazapine.  

## 2022-10-13 NOTE — Assessment & Plan Note (Signed)
takes Levothyroxine, TSH 3.48 06/28/22 

## 2022-10-13 NOTE — H&P (Signed)
History and Physical    Madison [name] UJW:[id number] DOB: April 09, 1941 DOA: 10/13/2022  PCP: Magdalene Patricia, MD  Patient coming from: [facility]  I have personally briefly reviewed patient's old medical records in [facility]  Chief Complaint: Abnormal labs  HPI: Madison [name] is a 82 y.o. 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  History is supplemented by patient's spouse at bedside.  Spouse states that after dinner this past Sunday (6/16) patient developed frequent nausea and vomiting.  Appetite has been low and she has not been eating much since then.  She has had a couple episodes of loose stools.  She has not really had much abdominal pain.  She has been feeling generally weak and fatigued.  She reports good urine output.  She has had episodes of urine and bowel incontinence.  She has had issues with recurrent right hip infection, last admitted in March for the same.  She continues to receive wound care to the right hip with occasional packing.  There has not been any purulent discharge or erythema in the area.  ED Course  Labs/Imaging on admission: I have personally reviewed following labs and imaging studies.  Initial vitals showed BP 100/56, pulse 87, RR 16, temp 99.1 F, SpO2 95% on room air.  Labs show WBC 15.3, hemoglobin 12.4, platelets 189,000, sodium 131, potassium 4.4, bicarb 20, BUN 57, creatinine 3.35 (baseline 1.2-1.3), serum glucose 122, LFTs within normal limits.  Portable chest x-ray showed chronic cardiomegaly and chronic peribronchial thickening, no acute findings.  Bladder scan showing only 92 ccs per EDP.  Urinalysis collected and pending.  Patient was given 500 cc normal saline and the  hospitalist service was consulted to admit for further evaluation and management.  Review of Systems: All systems reviewed and are negative except as documented in history of present illness above.   Past Medical History:  Diagnosis Date   Benign hypertension with chronic kidney disease, stage III (HCC)    Overview:  Last Assessment & Plan:  Usually the patient is hypertensive. However today her blood pressure is 105 systolic. She has been feeling fatigued with some lightheadedness. Part of this may be related to her lower blood pressure. Her pressure may be improved with her decrease in salt and fluid intake. I've instructed her to put her lisinopril/hctz on hold. 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 It was possibly reentrant. She does well  with diltiazem. No change in therapy.   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   Thyroid disease    Urinary incontinence     Past Surgical History:  Procedure Laterality Date   CATARACT EXTRACTION Left 8/11   CATARACT EXTRACTION Right 3/12   CHOLECYSTECTOMY  1994   Copsilotomy Laser Treatment Left 6/12   eye    ELBOW SURGERY  8/12   EYE SURGERY Left 09/2008   macular hole   EYE SURGERY Right 12/11   Lumbar Infusion  2011   MOLE REMOVAL  06/17/2015   TOTAL KNEE ARTHROPLASTY Left 6/11   TOTAL KNEE ARTHROPLASTY Right 06/13/2011    Social History:  reports that she has quit smoking. She has never been exposed to tobacco smoke. She has never used smokeless tobacco. She reports that she does not drink alcohol and does not use drugs.  Allergies  Allergen Reactions   Keflex [Cephalexin] Diarrhea   Macrobid [Nitrofurantoin] Hives   Amoxicillin Rash    Family History  Problem Relation Age of Onset   High blood pressure Mother    Diabetes Mother    Heart Problems Father    Diabetes Father    Prostate cancer Father    Heart attack Father    Heart attack Paternal Grandmother    Heart attack Maternal Grandfather    Heart attack Paternal Uncle    Stroke Neg Hx      Prior to Admission medications   Medication Sig Start Date End Date Taking? Authorizing Provider  acetaminophen (TYLENOL) 325 MG tablet Take 650 mg by mouth every 6 (six) hours as needed for moderate pain.    [provider]  albuterol (VENTOLIN HFA) 108 (90 Base) MCG/ACT inhaler Inhale 2 puffs into the lungs every 6 (six) hours as needed for wheezing or shortness of breath.    [provider]  bifidobacterium infantis (ALIGN) capsule Take 1 capsule by mouth daily.    [provider]  Calcium Carb-Cholecalciferol (CALCIUM PLUS VITAMIN D3 PO) Take 1 tablet by mouth daily. Vitamin D3 + Calcium 600    [provider]  Cholecalciferol (VITAMIN D3) 50 MCG (2000 UT) TABS Take 2 tablets by mouth daily.    [provider]  ferrous sulfate 325 (65 FE) MG EC tablet Take 325 mg by mouth as directed. Once A Day on Mon, Thu    [provider]  furosemide (LASIX) 40 MG tablet Take 40 mg by mouth 2 (two) times daily.    [provider]  gabapentin (NEURONTIN) 300 MG capsule Take  300 mg by mouth at bedtime. 03/02/20   [provider]  ipratropium-albuterol (DUONEB) 0.5-2.5 (3) MG/3ML SOLN Inhale 3 mLs into the lungs every 8 (eight) hours as needed (wheezing).    [provider]  levothyroxine (SYNTHROID) 25 MCG tablet Give 1 tablet orally one time a day every 2 day(s); 2 tablets (50 mcg) one time a day every 2 day(s)    [provider]  loperamide (IMODIUM) 2 MG capsule Take 2 mg by mouth every 4 (four) hours as needed for diarrhea or loose stools. 02/04/21   [provider]  nystatin (MYCOSTATIN/NYSTOP) powder Apply 1 application  topically in the morning, at noon, in the evening, and at bedtime. To genital areas and inner thigh    [provider]  ondansetron (ZOFRAN-ODT) 4 MG disintegrating tablet Take 4 mg by mouth every 8 (eight) hours as needed for nausea  or vomiting. 02/04/21   [provider]  pantoprazole (PROTONIX) 40 MG tablet Take 40 mg by mouth daily.    [provider]  potassium chloride SA (KLOR-CON) 20 MEQ tablet Take 20 mEq by mouth daily.    [provider]  spironolactone (ALDACTONE) 25 MG tablet Take 1 tablet (25 mg total) by mouth daily. 07/15/22   Alba Cory, MD    Physical Exam: Vitals:   10/13/22 1749 10/13/22 1912 10/13/22 2100 10/13/22 2201  BP: (!) 100/56  (!) 116/52   Pulse: 87  91   Resp: 16  16   Temp: 99.1 F (37.3 C)   98.2 F (36.8 C)  TempSrc: Oral   Oral  SpO2: 95%  96%   Weight:  133.8 kg    Height:  5\' 8"  (1.727 m)     Constitutional: Chronically ill-appearing obese woman resting in bed.  Appears fatigued but in NAD, calm. Eyes: EOMI, lids and conjunctivae normal ENMT: Mucous membranes are dry. Posterior pharynx clear of any exudate or lesions.Normal dentition.  Neck: normal, supple, no masses. Respiratory: clear to auscultation anteriorly. Normal respiratory effort. No accessory muscle use.  Cardiovascular: Regular rate and rhythm, no murmurs /  rubs / gallops.  Trace right lower extremity edema. 2+ pedal pulses. Abdomen: no tenderness, no masses palpated.  Musculoskeletal: ROM diminished at right hip due to absence of hip joint Skin: Chronic and stable appearing small right hip open wound with small amount of packing in place, no active discharge, no surrounding erythema, no tenderness to palpation. Neurologic: Sensation intact. Strength 0/5 RLE otherwise 5/5 other extremities. Psychiatric: Alert and oriented x 3.   EKG: Personally reviewed. Sinus rhythm, rate 84, RBBB.  RBBB is new when compared to previous.  Assessment/Plan Principal Problem:   Acute kidney injury superimposed on chronic kidney disease (HCC) Active Problems:   Chronic diastolic CHF (congestive heart failure) (HCC)   Leukocytosis   Essential hypertension   Acquired hypothyroidism   Absence of hip joint, right   Obstructive sleep apnea   Madison [name] is a 82 y.o. 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  Assessment and Plan: Acute kidney injury superimposed on CKD stage IIIa: Likely overall intravascularly volume depleted due to GI losses from gastroenteritis as well as poor oral intake in addition to medication effect. -Continue IV NS@100  mL/hour -Hold Lasix and spironolactone -Monitor I/O's and daily weights  Leukocytosis: WBC 15.3.  No obvious acute infectious etiology.  Chronic right hip wound appears stable.  UA not strongly suspicious for UTI.  Abdomen nontender.  CXR without evidence of pneumonia.  Chronic HFpEF: As above likely overall intravascular volume depleted due to GI losses.  Body habitus limits assessment on exam. -On gentle IV fluid hydration as above -Holding Lasix and spironolactone -Strict I/O's and daily  weights  Hypertension: Blood pressures have been soft.  Holding Lasix and spironolactone.  On IV fluids as above.  S/p right hip hardware removal (12/2020) due to prior prosthetic joint infection with chronic nonambulatory status: Has had issues with recurrent infection.  Chronic wound looks stable with small amount of packing in place, no obvious acute infectious appearance.  Continue to monitor.  Hypothyroidism: Continue Synthroid.  OSA: No longer using CPAP.   DVT prophylaxis: heparin injection 5,000 Units Start: 10/13/22 2200 Code  Status: Full code, discussed with patient and spouse at bedside.  They confirm CODE STATUS is full code despite previous DNR form under ACP documents. Family Communication: Spouse at bedside. Disposition Plan: From SNF and likely return to SNF pending clinical progress Consults called: None Severity of Illness: The appropriate patient status for this patient is OBSERVATION. Observation status is judged to be reasonable and necessary in order to provide the required intensity of service to ensure the patient's safety. The patient's presenting symptoms, physical exam findings, and initial radiographic and laboratory data in the context of their medical condition is felt to place them at decreased risk for further clinical deterioration. Furthermore, it is anticipated that the patient will be medically stable for discharge from the hospital within 2 midnights of admission.   Darreld Mclean MD [facility]  If 7PM-7AM, please contact night-coverage [email or web address]  10/13/2022, 10:04 PM

## 2022-10-13 NOTE — Assessment & Plan Note (Signed)
Na 137 09/05/22 

## 2022-10-13 NOTE — Assessment & Plan Note (Signed)
malaise, wheezing, afebrile, no O2 desaturation, denied cough, chest pain, palpitation, nausea, vomiting, or abd pain/right hip pain. Open drainage site right hip with clean dressing on.  Update CBC/diff, CMP/eGFR, BNP, CXR ap/lateral, schedule DuoNeb q8hr x 7 days for now.

## 2022-10-13 NOTE — Assessment & Plan Note (Signed)
post op, s/p EGD no active bleed. S/p 6 u PRBC transfusion, ASA was dc'd, On Fe, Iron 15 in hospital, Hgb 10.4 09/05/22  

## 2022-10-13 NOTE — ED Triage Notes (Signed)
Pt BIBA from [facility] w/ c/o abnormal labs. Non-ambulatory.   BP 100/34 HR 90 RR 16 93% RA

## 2022-10-13 NOTE — Progress Notes (Signed)
Location:   S[facility] Room Number: NO/56/A Place of Service:  SNF (31) Provider: [facility] Alesa Echevarria NP  Heffner, Jimmye Norman, MD  Patient Care Team: Eulis Foster Jimmye Norman, MD as PCP - General (Family Medicine) [name], MD as PCP - Cardiology (Cardiology) [name], MD as PCP - Sleep Medicine (Cardiology) [name], MD (Sports Medicine) [name], MD (Ophthalmology) [name], [name], MD (Dermatology) [name], MD (Gastroenterology) [name], MD (Nephrology) [name], DC as Referring Physician (Chiropractic Medicine)  Extended Emergency Contact Information Primary Emergency Contact: [facility] Address: [address]          [place], [place] [address] [name] of [place] Home Phone: [phone] Mobile Phone: [phone] Relation: Spouse  Code Status: DNR Goals of care: Advanced Directive information    08/25/2022    9:14 AM  Advanced Directives  Does Patient Have a Medical Advance Directive? Yes  Type of Advance Directive Living will;Out of facility DNR (pink MOST or yellow form)  Does patient want to make changes to medical advance directive? No - Patient declined  Pre-existing out of facility DNR order (yellow form or pink MOST form) Yellow form placed in chart (order not valid for inpatient use)     Chief Complaint  Patient presents with   Acute Visit    Patient is being seen for wheezing and malaise   Quality Metric Gaps    Patient is due for an AWV after 11/12/2022    HPI:  Pt is a 82 y.o. female seen today for an acute visit for malaise, wheezing, afebrile, no O2 desaturation, denied cough, chest pain, palpitation, nausea, vomiting, or abd pain/right hip pain. Open drainage site right hip with clean dressing on.      Hospitalized 07/08/22 for CHF, abscess of the right hip which previous infected, treated, and prosthesis was removed and never replaced. Open drainage site presents.                Hospitalized 07/02/22-07/07/22 for the right hip cellulitis, CT hip in hospital fluid collection unclear if it communicates with the joint space, Ortho not felt to be related to the joint space and may be an old fluid collection, aspiration not recommended.              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 F/u Ortho. TDWB. Power wc. 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 Fully treated, then off antibiotics.               Elevated AST/ALT normalized, S/p cholecystectomy, [place] 02/19/21 no cyst or mass.              Anemia, post op, s/p EGD no active bleed. S/p 6 u PRBC transfusion, ASA was dc'd, On Fe, Iron 15 in hospital, Hgb 10.4 09/05/22              Muscle spasm, controlled, takes Gabapentin, prn Methocarbamol.              HTN only on Furosemide, Spironolactone.              Morbid obesity             OSA CPAP             Chronic diarrhea, prn Imodium, Align             RA f/u Rheumatology, hx of Plaquenil use, stable  presently.              Insomnia/depression/anxiety, stable, off Mirtazapine.              GERD, better on  Pantoprazole, off  Omeprazole, prn Zofran. Hgb 10.4 09/05/22             CHF/Edema, dependent  areas, takes Furosemide, Spironolactone CKD stage 3 Bun/creat 17/1.22 09/05/22 Hyponatremia, Na 137 09/05/22             Hypothyroidism, takes Levothyroxine, TSH 3.48 06/28/22        Past Medical History:  Diagnosis Date   Benign hypertension with chronic kidney disease, stage III (HCC)    Overview:  Last Assessment & Plan:  Usually the patient is hypertensive. However today her blood pressure is 105 systolic. She has been feeling fatigued with some lightheadedness. Part of this may be related to her lower blood pressure. Her pressure may be improved with her decrease in salt and fluid intake. I've instructed her to put her lisinopril/hctz on hold. I've asked her to see her primary    Cardiac  disease 03/10/2014   Chronic diarrhea 07/27/2015   Edema 07/27/2015   Ejection fraction 2004   Normal, echo,    Gastroesophageal reflux disease    GERD (gastroesophageal reflux disease)    Heart disease 03/10/2014   Hypertension    Left bundle branch block 09/01/2020   Obesity    Obesity (BMI 30-39.9) 09/25/2017   OSA (obstructive sleep apnea)    mild with AHI 6.75 - on CPAP   Preop cardiovascular exam 11/2010   Cardiac clearance for knee surgery    Sleep apnea 03/10/2014   Supraventricular tachycardia    Documented episode in the past, possibly reentrant tachycardia  Overview:  Overview:  Documented episode in the past, 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 It was possibly reentrant. She does well with diltiazem. No change in therapy.   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   Thyroid disease    Urinary incontinence    Past Surgical History:  Procedure Laterality Date   CATARACT EXTRACTION Left 8/11   CATARACT EXTRACTION Right 3/12   CHOLECYSTECTOMY  1994   Copsilotomy Laser Treatment Left 6/12   eye   ELBOW SURGERY  8/12   EYE SURGERY Left 09/2008   macular hole   EYE SURGERY Right 12/11   Lumbar Infusion  2011   MOLE REMOVAL  06/17/2015   TOTAL KNEE ARTHROPLASTY Left 6/11   TOTAL KNEE ARTHROPLASTY Right 06/13/2011    Allergies  Allergen Reactions   Keflex [Cephalexin] Diarrhea   Macrobid [Nitrofurantoin] Hives   Amoxicillin Rash    Allergies as of 10/13/2022       Reactions   Keflex [cephalexin] Diarrhea   Macrobid [nitrofurantoin] Hives   Amoxicillin Rash        Medication List        Accurate as of October 13, 2022 12:09 PM. If you have any questions, ask your nurse or doctor.          acetaminophen 325 MG tablet Commonly known as: TYLENOL Take 650 mg by mouth every 6 (six) hours as  needed for moderate pain.   albuterol 108 (90 Base) MCG/ACT inhaler Commonly known as: VENTOLIN HFA Inhale 2 puffs into the lungs every 6 (six) hours as needed for wheezing or shortness  of breath.   bifidobacterium infantis capsule Take 1 capsule by mouth daily.   CALCIUM PLUS VITAMIN D3 PO Take 1 tablet by mouth daily. Vitamin D3 + Calcium 600   ferrous sulfate 325 (65 FE) MG EC tablet Take 325 mg by mouth as directed. Once A Day on Mon, Thu   furosemide 40 MG tablet Commonly known as: LASIX Take 40 mg by mouth 2 (two) times daily.   gabapentin 300 MG capsule Commonly known as: NEURONTIN Take 300 mg by mouth at bedtime.   ipratropium-albuterol 0.5-2.5 (3) MG/3ML Soln Commonly known as: DUONEB Inhale 3 mLs into the lungs every 8 (eight) hours as needed (wheezing).   levothyroxine 25 MCG tablet Commonly known as: SYNTHROID Give 1 tablet orally one time a day every 2 day(s); 2 tablets (50 mcg) one time a day every 2 day(s)   loperamide 2 MG capsule Commonly known as: IMODIUM Take 2 mg by mouth every 4 (four) hours as needed for diarrhea or loose stools.   nystatin powder Commonly known as: MYCOSTATIN/NYSTOP Apply 1 application  topically in the morning, at noon, in the evening, and at bedtime. To genital areas and inner thigh   ondansetron 4 MG disintegrating tablet Commonly known as: ZOFRAN-ODT Take 4 mg by mouth every 8 (eight) hours as needed for nausea or vomiting.   potassium chloride SA 20 MEQ tablet Commonly known as: KLOR-CON M Take 20 mEq by mouth daily.   Protonix 40 MG tablet Generic drug: pantoprazole Take 40 mg by mouth daily.   spironolactone 25 MG tablet Commonly known as: ALDACTONE Take 1 tablet (25 mg total) by mouth daily.   Vitamin D3 50 MCG (2000 UT) Tabs Take 2 tablets by mouth daily.        Review of Systems  Constitutional:  Positive for appetite change and fatigue. Negative for fever.  HENT:  Negative for congestion, sore  throat and trouble swallowing.   Eyes:  Negative for visual disturbance.  Respiratory:  Positive for wheezing. Negative for cough and shortness of breath.   Cardiovascular:  Negative for leg swelling.  Gastrointestinal:  Negative for abdominal pain, constipation and nausea.  Genitourinary:  Negative for dysuria, frequency, genital sores, urgency, vaginal bleeding and vaginal discharge.       Urogenital itching upon urination w/o skin irritation or rash.  Musculoskeletal:  Positive for arthralgias and gait problem.       Lower back muscle spasm is better controlled  Skin:  Positive for wound. Negative for color change and rash.       Resolved ight hip region redness, warmth, swelling, currently drainage opening present. Improved rash in the mid to lower back skin folds.   Neurological:  Negative for speech difficulty and weakness.  Psychiatric/Behavioral:  Negative for confusion and sleep disturbance. The patient is not nervous/anxious.     Immunization History  Administered Date(s) Administered   Influenza Split 01/28/2008, 02/17/2009, 02/20/2012, 02/11/2013, 02/02/2016, 01/11/2017, 02/04/2019   Influenza, High Dose Seasonal PF 02/04/2015, 01/11/2017, 01/30/2018, 02/05/2020   Influenza-Unspecified 02/16/2022   Moderna SARS-COV2 Booster Vaccination 02/24/2022   Moderna Sars-Covid-2 Vaccination 05/09/2019, 05/27/2019, 03/03/2020, 09/04/2020, 11/04/2020   Pfizer Covid-19 Vaccine Bivalent Booster 30yrs & up 02/09/2021   Pneumococcal Conjugate-13 08/30/2013   Pneumococcal Polysaccharide-23 11/03/2005   RSV,unspecified 04/22/2022   Td 08/01/2001, 09/05/2019   Tdap 09/22/2010, 11/24/2020   Zoster Recombinat (Shingrix) 11/08/2016, 01/11/2017   Zoster, Live 11/04/2005, 11/08/2016, 01/11/2017   Zoster, Unspecified 01/11/2017   Pertinent  Health Maintenance Due  Topic Date Due   INFLUENZA VACCINE  11/24/2022   DEXA SCAN  Completed      10/24/2020   11:31 AM 04/15/2022   10:15 AM 04/27/2022    11:35 AM 05/31/2022    9:34 AM 07/26/2022    9:31 AM  Fall Risk  Falls in the past year?  0 0 0 0  Was there an injury with Fall?  0 0 0 0  Fall Risk Category Calculator  0 0 0 0  Fall Risk Category (Retired)  Low Low    (RETIRED) Patient Fall Risk Level Moderate fall risk Moderate fall risk Moderate fall risk    Patient at Risk for Falls Due to  History of fall(s) History of fall(s) History of fall(s)   Fall risk Follow up  Falls evaluation completed Falls evaluation completed Falls evaluation completed    Functional Status Survey:    Vitals:   10/13/22 1035  BP: 119/72  Pulse: 66  Resp: 18  Temp: (!) 97.3 F (36.3 C)  SpO2: 94%  Weight: 295 lb (133.8 kg)  Height: 5\' 8"  (1.727 m)   Body mass index is 44.85 kg/m. Physical Exam Vitals and nursing note reviewed.  Constitutional:      Appearance: She is obese.     Comments: Malaise, fatigue  HENT:     Head: Normocephalic and atraumatic.     Nose: Nose normal.     Mouth/Throat:     Mouth: Mucous membranes are moist.  Eyes:     Extraocular Movements: Extraocular movements intact.     Conjunctiva/sclera: Conjunctivae normal.     Pupils: Pupils are equal, round, and reactive to light.  Cardiovascular:     Rate and Rhythm: Normal rate and regular rhythm.     Heart sounds: No murmur heard. Pulmonary:     Effort: Pulmonary effort is normal.     Breath sounds: Wheezing present. No rales.  Abdominal:     General: Bowel sounds are normal.     Palpations: Abdomen is soft.     Tenderness: There is no abdominal tenderness. There is no guarding.  Genitourinary:    General: Normal vulva.     Vagina: No vaginal discharge.     Rectum: Normal.     Comments: Urogenital itching upon urination w/o skin irritation or rash. Musculoskeletal:     Cervical back: Normal range of motion and neck supple.     Right lower leg: No edema.     Left lower leg: No edema.     Comments: R hip, prosthetic hip removed.   Skin:    General: Skin is  warm and dry.     Findings: Erythema present.     Comments: Right hip surgical scar, right hip region resolved redness, warmth, swelling, currently drainage opening present. Edema mostly in the right sided flank, buttock, thigh  Neurological:     General: No focal deficit present.     Mental Status: She is alert and oriented to person, place, and time.     Gait: Gait normal.  Psychiatric:        Mood and Affect: Mood normal.        Behavior: Behavior normal.        Thought Content: Thought content normal.        Judgment: Judgment normal.     Labs reviewed: Recent Labs    07/04/22 0502 07/05/22 0538 07/06/22 0702 07/08/22 1714 07/12/22 0437 07/13/22 0412 07/14/22 0444 07/21/22 0000 08/18/22 0000 08/23/22 0000  NA 134* 135 136   < > 133* 130* 132* 132* 139 139  K 4.2 3.7 3.4*   < > 3.0* 3.5 3.3* 3.7 3.3* 3.7  CL 104 106 109   < > 102 101 99 101 105 104  CO2 20* 19* 19*   < > 20* 20* 22 23* 27* 28*  GLUCOSE 96 94 106*   < > 113* 103* 104*  --   --   --   BUN 25* 24* 20   < > 20 22 21 19 15 18   CREATININE 1.21* 1.14* 1.01*   < > 1.21* 1.28* 1.41* 1.3* 1.2* 1.3*  CALCIUM 8.2* 8.4* 8.4*   < > 7.7* 8.2* 8.3* 8.3* 8.5* 8.3*  MG 2.4 2.4 2.4  --  1.8  --   --   --   --   --   PHOS 3.6 4.3 3.6  --   --   --   --   --   --   --    < > = values in this interval not displayed.   Recent Labs    07/04/22 0502 07/05/22 0538 07/06/22 0702 07/21/22 0000 08/18/22 0000  AST 23 30 36 39* 22  ALT 17 21 25  48* 18  ALKPHOS 71 66 63 64 85  BILITOT 0.6 0.6 0.3  --   --   PROT 6.2* 6.3* 6.4*  --   --   ALBUMIN 2.2* 2.2* 2.3* 3.0* 2.9*   Recent Labs    07/06/22 0702 07/08/22 1714 07/08/22 2208 07/09/22 0353 07/11/22 0411 07/13/22 0412 07/21/22 0000  WBC 11.9* 15.6*   < > 17.2* 12.8* 9.5 6.4  NEUTROABS 9.5* 12.1*  --   --   --   --  4,230.00  HGB 10.8* 11.7*   < > 11.4* 11.7* 11.9* 11.7*  HCT 37.0 38.5   < > 38.0 39.4 39.9 36  MCV 91.8 90.2   < > 90.9 89.1 89.7  --   PLT 536*  607*   < > 604* 601* 466* 221   < > = values in this interval not displayed.   Lab Results  Component Value Date   TSH 4.51 06/30/2021   No results found for: "HGBA1C" No results found for: "CHOL", "HDL", "LDLCALC", "LDLDIRECT", "TRIG", "CHOLHDL"  Significant Diagnostic Results in last 30 days:  No results found.  Assessment/Plan: Malaise and fatigue malaise, wheezing, afebrile, no O2 desaturation, denied cough, chest pain, palpitation, nausea, vomiting, or abd pain/right hip pain. Open drainage site right hip with clean dressing on.  Update CBC/diff, CMP/eGFR, BNP, CXR ap/lateral, schedule DuoNeb q8hr x 7 days for now.      Chronic diastolic CHF (congestive heart failure) (HCC) Update CBC/diff, CMP/eGFR, BNP, CXR ap/lateral, schedule DuoNeb q8hr x 7 days for now. dependent edema, noted expiratory wheezes and generalized malaise, takes Furosemide, Spironolactone, will give additional Furosemide 40mg  po x1  Blood loss anemia  post op, s/p EGD no active bleed. S/p 6 u PRBC transfusion, ASA was dc'd, On Fe, Iron 15 in hospital, Hgb 10.4 09/05/22   Muscle spasm controlled, takes Gabapentin, prn Methocarbamol.   Essential hypertension Blood pressure is controlled, only on Furosemide, Spironolactone.   Rheumatoid arthritis (HCC) hx of Plaquenil use, stable presently.   Insomnia secondary to depression with anxiety  stable, off Mirtazapine.   GERD (gastroesophageal reflux disease) better on  Pantoprazole, off  Omeprazole, prn Zofran. Hgb 10.4 09/05/22  Stage 3a chronic kidney disease (CKD) (HCC) Bun/creat 17/1.22 09/05/22  Hyponatremia  Na 137 09/05/22    Family/ staff Communication: plan of care reviewed with the patient, the patient's husband, and charge nurse.   Labs/tests ordered: CBC/diff, CMP/eGFR, BNP,  CXR ap/lateral.  Time spend 35 minutes.

## 2022-10-13 NOTE — Assessment & Plan Note (Signed)
Bun/creat 17/1.22 09/05/22

## 2022-10-13 NOTE — Hospital Course (Addendum)
82 year old woman PMH including prior right hip prosthesis infection requiring removal of hardware 08/2020, subsequent nonambulatory status, presented for nausea and vomiting and poor oral intake.  Found to have significant AKI and admitted for further evaluation and treatment. She has had issues with recurrent right hip infection, last admitted in March for the same.  She continues to receive wound care to the right hip with occasional packing. Treated with IVF with only very gradual improvement in renal function. Anticipate discharge in the next one to two days.  Consultants None  Procedures None

## 2022-10-13 NOTE — Assessment & Plan Note (Signed)
hx of Plaquenil use, stable presently.  

## 2022-10-13 NOTE — Assessment & Plan Note (Signed)
Blood pressure is controlled, only on Furosemide, Spironolactone.  

## 2022-10-13 NOTE — Assessment & Plan Note (Signed)
Update CBC/diff, CMP/eGFR, BNP, CXR ap/lateral, schedule DuoNeb q8hr x 7 days for now. dependent edema, noted expiratory wheezes and generalized malaise, takes Furosemide, Spironolactone, will give additional Furosemide 40mg  po x1

## 2022-10-14 DIAGNOSIS — Z9049 Acquired absence of other specified parts of digestive tract: Secondary | ICD-10-CM | POA: Diagnosis not present

## 2022-10-14 DIAGNOSIS — D72829 Elevated white blood cell count, unspecified: Secondary | ICD-10-CM

## 2022-10-14 DIAGNOSIS — Z89621 Acquired absence of right hip joint: Secondary | ICD-10-CM | POA: Diagnosis not present

## 2022-10-14 DIAGNOSIS — Z96653 Presence of artificial knee joint, bilateral: Secondary | ICD-10-CM | POA: Diagnosis present

## 2022-10-14 DIAGNOSIS — G4730 Sleep apnea, unspecified: Secondary | ICD-10-CM | POA: Diagnosis present

## 2022-10-14 DIAGNOSIS — K219 Gastro-esophageal reflux disease without esophagitis: Secondary | ICD-10-CM | POA: Diagnosis present

## 2022-10-14 DIAGNOSIS — E039 Hypothyroidism, unspecified: Secondary | ICD-10-CM | POA: Diagnosis present

## 2022-10-14 DIAGNOSIS — Z79899 Other long term (current) drug therapy: Secondary | ICD-10-CM | POA: Diagnosis not present

## 2022-10-14 DIAGNOSIS — Z1152 Encounter for screening for COVID-19: Secondary | ICD-10-CM | POA: Diagnosis not present

## 2022-10-14 DIAGNOSIS — I5032 Chronic diastolic (congestive) heart failure: Secondary | ICD-10-CM

## 2022-10-14 DIAGNOSIS — Z87891 Personal history of nicotine dependence: Secondary | ICD-10-CM | POA: Diagnosis not present

## 2022-10-14 DIAGNOSIS — Z79890 Hormone replacement therapy (postmenopausal): Secondary | ICD-10-CM | POA: Diagnosis not present

## 2022-10-14 DIAGNOSIS — F32A Depression, unspecified: Secondary | ICD-10-CM | POA: Diagnosis present

## 2022-10-14 DIAGNOSIS — Z8249 Family history of ischemic heart disease and other diseases of the circulatory system: Secondary | ICD-10-CM | POA: Diagnosis not present

## 2022-10-14 DIAGNOSIS — N189 Chronic kidney disease, unspecified: Secondary | ICD-10-CM | POA: Diagnosis not present

## 2022-10-14 DIAGNOSIS — N179 Acute kidney failure, unspecified: Principal | ICD-10-CM | POA: Diagnosis present

## 2022-10-14 DIAGNOSIS — Z881 Allergy status to other antibiotic agents status: Secondary | ICD-10-CM | POA: Diagnosis not present

## 2022-10-14 DIAGNOSIS — I10 Essential (primary) hypertension: Secondary | ICD-10-CM | POA: Diagnosis not present

## 2022-10-14 DIAGNOSIS — Z9841 Cataract extraction status, right eye: Secondary | ICD-10-CM | POA: Diagnosis not present

## 2022-10-14 DIAGNOSIS — Z6841 Body Mass Index (BMI) 40.0 and over, adult: Secondary | ICD-10-CM | POA: Diagnosis not present

## 2022-10-14 DIAGNOSIS — F419 Anxiety disorder, unspecified: Secondary | ICD-10-CM | POA: Diagnosis present

## 2022-10-14 DIAGNOSIS — I130 Hypertensive heart and chronic kidney disease with heart failure and stage 1 through stage 4 chronic kidney disease, or unspecified chronic kidney disease: Secondary | ICD-10-CM | POA: Diagnosis present

## 2022-10-14 DIAGNOSIS — E860 Dehydration: Secondary | ICD-10-CM | POA: Diagnosis present

## 2022-10-14 DIAGNOSIS — Z888 Allergy status to other drugs, medicaments and biological substances status: Secondary | ICD-10-CM | POA: Diagnosis not present

## 2022-10-14 DIAGNOSIS — N1831 Chronic kidney disease, stage 3a: Secondary | ICD-10-CM | POA: Diagnosis present

## 2022-10-14 DIAGNOSIS — K529 Noninfective gastroenteritis and colitis, unspecified: Secondary | ICD-10-CM | POA: Diagnosis present

## 2022-10-14 DIAGNOSIS — G4733 Obstructive sleep apnea (adult) (pediatric): Secondary | ICD-10-CM | POA: Diagnosis present

## 2022-10-14 LAB — CBC
HCT: 40.2 % (ref 36.0–46.0)
Hemoglobin: 12.5 g/dL (ref 12.0–15.0)
MCH: 28.2 pg (ref 26.0–34.0)
MCHC: 31.1 g/dL (ref 30.0–36.0)
MCV: 90.7 fL (ref 80.0–100.0)
Platelets: 170 10*3/uL (ref 150–400)
RBC: 4.43 MIL/uL (ref 3.87–5.11)
RDW: 14.2 % (ref 11.5–15.5)
WBC: 23.8 10*3/uL — ABNORMAL HIGH (ref 4.0–10.5)
nRBC: 0 % (ref 0.0–0.2)

## 2022-10-14 LAB — MRSA NEXT GEN BY PCR, NASAL: MRSA by PCR Next Gen: DETECTED — AB

## 2022-10-14 LAB — BASIC METABOLIC PANEL
Anion gap: 11 (ref 5–15)
BUN: 56 mg/dL — ABNORMAL HIGH (ref 8–23)
CO2: 19 mmol/L — ABNORMAL LOW (ref 22–32)
Calcium: 7.9 mg/dL — ABNORMAL LOW (ref 8.9–10.3)
Chloride: 102 mmol/L (ref 98–111)
Creatinine, Ser: 3.3 mg/dL — ABNORMAL HIGH (ref 0.44–1.00)
GFR, Estimated: 13 mL/min — ABNORMAL LOW (ref >60)
Glucose, Bld: 110 mg/dL — ABNORMAL HIGH (ref 70–99)
Potassium: 3.4 mmol/L — ABNORMAL LOW (ref 3.5–5.1)
Sodium: 132 mmol/L — ABNORMAL LOW (ref 135–145)

## 2022-10-14 MED ORDER — SODIUM CHLORIDE 0.9 % IV BOLUS
250.0000 mL | Freq: Once | INTRAVENOUS | Status: AC
  Administered 2022-10-14: 250 mL via INTRAVENOUS

## 2022-10-14 MED ORDER — LACTATED RINGERS IV SOLN: INTRAVENOUS | Status: DC

## 2022-10-14 MED ORDER — CHLORHEXIDINE GLUCONATE CLOTH 2 % EX PADS
6.0000 | MEDICATED_PAD | Freq: Every day | CUTANEOUS | Status: AC
  Administered 2022-10-14 – 2022-10-18 (×4): 6 via TOPICAL

## 2022-10-14 MED ORDER — MUPIROCIN 2 % EX OINT
1.0000 | TOPICAL_OINTMENT | Freq: Two times a day (BID) | CUTANEOUS | Status: AC
  Administered 2022-10-14 – 2022-10-18 (×10): 1 via NASAL
  Filled 2022-10-14: qty 22

## 2022-10-14 NOTE — TOC Initial Note (Signed)
Transition of Care [facility]) - Initial/Assessment Note    Patient Details  Name: Madison [name] MRN: [id number] Date of Birth: November 24, 1940  [facility] CM/SW Contact:    Amada Jupiter, LCSW Phone Number: 10/14/2022, 2:11 PM  Clinical Narrative:                  Met with pt and spouse today to review anticipated dc needs.  Both confirm that pt is a resident in the SNF area at [facility] (there ~ 2 yrs) and [place].  Pt is bed and power wheelchair bound and using hoyer lift for transfers.  Have confirmed with pt/spouse and [facility] that plan will be for pt to return to her SNF bed at discharge and will need PTAR for dc transportation.  If pt cleared for return over weekend, the facility contact will be Mia @ [phone] and #s for RN report are either [phone] or [phone].  TOC will continue to follow.   Expected Discharge Plan: Skilled Nursing Facility Barriers to Discharge: Continued Medical Work up   Patient Goals and CMS Choice Patient states their goals for this hospitalization and ongoing recovery are:: return to SNF          Expected Discharge Plan and Services     Post Acute Care Choice: Nursing Home Living arrangements for the past 2 months: Skilled Nursing Facility                 DME Arranged: N/A DME Agency: NA                  Prior Living Arrangements/Services Living arrangements for the past 2 months: Skilled Nursing Facility Lives with:: Facility Resident Patient language and need for interpreter reviewed:: Yes Do you feel safe going back to the place where you live?: Yes      Need for Family Participation in Patient Care: No (Comment) Care giver support system in place?: Yes (comment)      Activities of Daily Living Home Assistive Devices/Equipment: Wheelchair, Nurse, adult ADL Screening (condition at time of admission) Patient's cognitive ability adequate to safely complete daily activities?: Yes Is  the patient deaf or have difficulty hearing?: Yes Does the patient have difficulty seeing, even when wearing glasses/contacts?: No Does the patient have difficulty concentrating, remembering, or making decisions?: No Patient able to express need for assistance with ADLs?: Yes Does the patient have difficulty dressing or bathing?: Yes Independently performs ADLs?: No Communication: Independent Dressing (OT): Needs assistance Is this a change from baseline?: Pre-admission baseline Grooming: Needs assistance Is this a change from baseline?: Pre-admission baseline Feeding: Independent Bathing: Needs assistance Is this a change from baseline?: Pre-admission baseline Toileting: Needs assistance Is this a change from baseline?: Pre-admission baseline In/Out Bed: Needs assistance Is this a change from baseline?: Pre-admission baseline Walks in Home: Dependent (wheelchair bound) Is this a change from baseline?: Pre-admission baseline Does the patient have difficulty walking or climbing stairs?: Yes Weakness of Legs: Both Weakness of Arms/Hands: None  Permission Sought/Granted Permission sought to share information with : Family Supports, Oceanographer granted to share information with : Yes, Verbal Permission Granted  Share Information with NAME: spouse, Quianna Avery @ [phone]  Permission granted to share info w AGENCY: [facility]        Emotional Assessment Appearance:: Appears stated age Attitude/Demeanor/Rapport: Gracious Affect (typically observed): Accepting, Quiet Orientation: : Oriented to Self, Oriented to Place, Oriented to  Time, Oriented to Situation Alcohol / Substance Use: Not Applicable Psych Involvement: No (comment)  Admission diagnosis:  Dehydration [E86.0] AKI (acute kidney injury) (HCC) [N17.9] Acute kidney injury superimposed on chronic kidney disease (HCC) [N17.9, N18.9] Patient Active Problem List   Diagnosis Date  Noted   Acute kidney injury (HCC) 10/13/2022   Acute kidney injury superimposed on chronic kidney disease (HCC) 10/13/2022   Muscle spasm 09/05/2022   Medication management 07/27/2022   Cellulitis of left thigh 07/27/2022   Cutaneous abscess of right hip 07/21/2022   Hyponatremia 07/21/2022   Vaginal bleeding 07/20/2022   Multifocal pneumonia 07/08/2022   Leukocytosis 07/08/2022   Acute on chronic diastolic CHF (congestive heart failure) (HCC) 07/08/2022   Chronic anemia 07/08/2022   Infected abrasion of right hip, initial encounter 07/03/2022   Cellulitis of right hip 07/03/2022   Atypical chest pain 07/03/2022   Chronic diastolic CHF (congestive heart failure) (HCC) 07/03/2022   Weight gain 06/27/2022   Malaise and fatigue 06/27/2022   COVID-19 virus infection 05/16/2022   Ingrown left greater toenail 04/12/2022   Dysuria 04/12/2022   Absence of hip joint, 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   Gait disorder 01/27/2022   Pressure ulcer, stage 2 (HCC) 07/12/2021   Candidiasis of skin 04/13/2021   UTI (urinary tract infection) 04/02/2021   Hypokalemia 02/26/2021   Pressure ulcer of thigh, stage 2 (HCC) 02/19/2021   Elevated liver enzymes 02/16/2021   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   Right hip pain 02/05/2021   Sepsis (HCC) 02/05/2021   Blood loss anemia 02/05/2021   Rheumatoid arthritis (HCC) 02/05/2021   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   Acquired hypothyroidism 02/05/2021   Drug rash 02/05/2021   Stage 3a chronic kidney disease (CKD) (HCC) 01/30/2019   Astigmatism with presbyopia, bilateral 01/22/2019   Blepharitis of both upper and lower eyelid 01/22/2019   Dermatochalasis of both upper eyelids 01/22/2019   Dry eyes, bilateral 01/22/2019   Epiretinal membrane (ERM) of both eyes 01/22/2019   Meibomian gland dysfunction (MGD) of both eyes 01/22/2019   Pseudophakia of both eyes 01/22/2019   Vitreous syneresis of both eyes 01/22/2019   Acute  cystitis without hematuria 11/01/2018   E-coli UTI 06/28/2018   Malodorous urine 06/28/2018   Class 3 obesity (HCC) 09/25/2017   Obstructive sleep apnea    Nasal sore 10/07/2016   Metabolic bone disease 09/01/2015   Vitamin D deficiency 09/01/2015   Chronic diarrhea 07/27/2015   Edema of lower extremity 11/11/2014   Cardiac disease 03/10/2014   Heart disease 03/10/2014   Encounter for preprocedural cardiovascular examination    Essential hypertension    GERD (gastroesophageal reflux disease)    Adiposity    Supraventricular tachycardia (HCC)    Decreased cardiac output    Urinary incontinence    PCP:  Magdalene Patricia, MD Pharmacy:   [id number] [place], [place] - [address] [place] [address] Phone: [phone] Fax: [phone]  [facility] [address] [place] [address] Phone: [phone] Fax: [phone]     Social Determinants of Health (SDOH) Social History: SDOH Screenings   Food Insecurity: No Food Insecurity (10/13/2022)  Housing: Low Risk  (10/13/2022)  Transportation Needs: No Transportation Needs (10/13/2022)  Utilities: Not At Risk (10/13/2022)  Depression (PHQ2-9): Low Risk  (07/26/2022)  Tobacco Use: Medium Risk (10/13/2022)   SDOH Interventions:     Readmission Risk Interventions     No data to display

## 2022-10-14 NOTE — Progress Notes (Signed)
PT Cancellation Note  Patient Details Name: Madison [name] MRN: [id number] DOB: 17-Feb-1941   Cancelled Treatment:    Reason Eval/Treat Not Completed: PT screened, no needs identified, will sign off, pt is baseline LTC at SNF, total lift for transfers and nonambulatory.   Johnny Bridge, PT Acute Rehab   Jacqualyn Posey 10/14/2022, 3:11 PM

## 2022-10-14 NOTE — Consult Note (Signed)
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 We will continue here and in addition also provide PI prevention using floatation of heels and placement of a sacral foam. Turning and repositioning is in place.  WOC nursing team will not follow, but will remain available to this patient, the nursing and medical teams.  Please re-consult if needed.  Thank you for inviting [place] to participate in this patient's Plan of Care.  Ladona Mow, MSN, RN, CNS, GNP, Leda Min, [employer], [employer] phone:  [phone]

## 2022-10-14 NOTE — Evaluation (Signed)
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 Pt had episodes nausea/vomiting, poor p.o. intake & loose bowels as well as general malaise.  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 intake & general malaise. PMH: depression, OSA on CPAP, chronic HFpEF, GERD, HTN, LBBB, SVT, B knee sx, failed R THA secondary to infec)   Clinical Impression   The pt & her spouse report the pt is currently presenting with increased deconditioning and weakness, as compared to her baseline. At the facility, she requires total assist for lower body dressing & toileting, set-up assist for grooming, and total assist for transfer into & out of her electric wheelchair; she could assist with performing upper body bathing and dressing. The pt and her spouse inquired about initiating therapy services again for the pt at the SNF, as she had previously received such services before therapy signed off; the pt reported motivation to begin therapy again. OT will follow the pt in the acute care setting for strengthening and ADL instruction, in order to maximize her ADL performance and to decrease the risk for further weakness and deconditioning.      Recommendations for follow up therapy are one component of a multi-disciplinary discharge planning process, led by the attending physician.  Recommendations may be updated based on patient status, additional functional criteria and insurance authorization.   Assistance Recommended at Discharge Frequent or constant Supervision/Assistance  Patient can return home with  the following Two people to help with walking and/or transfers;Direct supervision/assist for medications management;A lot of help with bathing/dressing/bathroom    Functional Status Assessment  Patient has had a recent decline in their functional status and demonstrates the ability to make significant improvements in function in a reasonable and predictable amount of time.  Equipment Recommendations  None recommended by OT       Precautions / Restrictions Precautions Precautions: Fall      Mobility Bed Mobility Overal bed mobility: Needs Assistance Bed Mobility: Rolling Rolling: Max assist                  ADL either performed or assessed with clinical judgement   ADL Overall ADL's : Needs assistance/impaired Eating/Feeding: Set up;Bed level   Grooming: Set up;Bed level           Upper Body Dressing : Moderate assistance;Bed level   Lower Body Dressing: Total assistance;Bed level       Toileting- Clothing Manipulation and Hygiene: Total assistance;Bed level                Pertinent Vitals/Pain Pain Assessment Pain Assessment: No/denies pain     Hand Dominance Right   Extremity/Trunk Assessment Upper Extremity Assessment Upper Extremity Assessment: RUE deficits/detail;LUE deficits/detail RUE Deficits / Details: Chronic shoulder AROM limitations with AROM for shoulder flexion<30 degrees. Elbow, wrist, and hand AROM WFL. Functional grip strength LUE Deficits / Details: AROM WFL. Grip strength 4-/5   Lower Extremity Assessment Lower Extremity Assessment: LLE deficits/detail;RLE deficits/detail RLE Deficits / Details: Mostly required PROM, except for the ankle. Generalized gross weakness LLE Deficits / Details: Required AAROM, except for the ankle. Generalized weakness  Communication Communication Communication: No difficulties   Cognition Arousal/Alertness: Awake/alert Behavior During Therapy: WFL for tasks assessed/performed Overall  Cognitive Status: Within Functional Limits for tasks assessed        General Comments: Oriented to person, place, month, year, and situation. Able to follow 1-2 step commands without difficulty            Home Living Family/patient expects to be discharged to:: Skilled nursing facility              Additional Comments: [facility]. Long term care resident      Prior Functioning/Environment Prior Level of Function : Needs assist             Mobility Comments: She is non-ambulatory & uses a electric wheelchair for mobility. She requires hoyer lift transfers vs. stander lift transfers into & out of the wheelchair. ADLs Comments: She could perform self-feeding independently and grooming with set-up assist. She required total assist for lower body dressing & toileting (use of bedpan vs. briefs); she could assist with upper body dressing and upper body bathing.        OT Problem List: Decreased strength;Decreased range of motion;Decreased activity tolerance;Impaired balance (sitting and/or standing);Impaired UE functional use      OT Treatment/Interventions: Self-care/ADL training;Therapeutic exercise;Energy conservation;DME and/or AE instruction;Therapeutic activities;Balance training;Patient/family education    OT Goals(Current goals can be found in the care plan section) Acute Rehab OT Goals Patient Stated Goal: to start back working with therapy OT Goal Formulation: With patient Time For Goal Achievement: 10/28/22 Potential to Achieve Goals: Fair ADL Goals Pt Will Perform Upper Body Bathing: with set-up;bed level Pt Will Perform Upper Body Dressing: with set-up;bed level Additional ADL Goal #1: The pt will perform rolling in bed with mod assist, in prep for progressive ADL participation. Additional ADL Goal #2: The pt will perform BUE therapeutic exercises using a resistance band with supervision, to facilitate improved strengthening needed to achieve  progressive ADL performance.  OT Frequency: Min 1X/week       AM-PAC OT "6 Clicks" Daily Activity     Outcome Measure Help from another person eating meals?: A Little Help from another person taking care of personal grooming?: A Little Help from another person toileting, which includes using toliet, bedpan, or urinal?: Total Help from another person bathing (including washing, rinsing, drying)?: A Lot Help from another person to put on and taking off regular upper body clothing?: A Lot Help from another person to put on and taking off regular lower body clothing?: Total 6 Click Score: 12   End of Session Nurse Communication: Mobility status  Activity Tolerance: Other (comment) (Fair tolerance) Patient left: in bed;with call bell/phone within reach;with family/visitor present;with bed alarm set  OT Visit Diagnosis: Muscle weakness (generalized) (M62.81)                Time: 1610-9604 OT Time Calculation (min): 22 min Charges:  OT General Charges $OT Visit: 1 Visit OT Evaluation $OT Eval Moderate Complexity: 1 Mod    Dawn Kiper L Luticia Tadros, OTR/L 10/14/2022, 4:07 PM

## 2022-10-14 NOTE — Progress Notes (Addendum)
  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  Found to have significant AKI and admitted for further evaluation and treatment. She has had issues with recurrent right hip infection, last admitted in March for the same.  She continues to receive wound care to the right hip with occasional packing.  There has not been any purulent discharge or erythema in the area.  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 Minimal improvement in renal function today.  Continue IV fluids, increase rate.  Daily BMP.   Leukocytosis: Significantly elevated, no obvious acute infectious etiology.  Continue care for chronic right hip.  Abdomen nontender.  CXR without evidence of pneumonia. Monitor closely.  If persists or worsens consider further workup.   Chronic HFpEF: Has chronic right foot edema secondary to lack of use of this extremity.  No significant left lower extremity. Overall appears to be dry.  Continue IV fluids. Hold diuretics.   Essential hypertension: Blood pressures were soft.  Now better.  Continue holding Lasix and spironolactone.  On IV fluids as above.   S/p right hip hardware removal (12/2020) due to prior prosthetic joint infection with chronic nonambulatory status: Continue chronic wound care.   Hypothyroidism: Continue Synthroid.   OSA: No longer using CPAP.     Subjective:  Tolerated breakfast and lunch No n/v now  Physical Exam: Vitals:   10/14/22 0436  10/14/22 0737 10/14/22 1301 10/14/22 1640  BP: (!) 80/48 100/62 (!) 115/54 (!) 115/58  Pulse:  85 79 79  Resp:  18 18 20   Temp:  98.3 F (36.8 C) 97.8 F (36.6 C) 98.2 F (36.8 C)  TempSrc:      SpO2:  93% 97% 97%  Weight:      Height:       Physical Exam Vitals reviewed.  Constitutional:      General: She is not in acute distress.    Appearance: She is not ill-appearing or toxic-appearing.  Cardiovascular:     Rate and Rhythm: Normal rate and regular rhythm.     Heart sounds: No murmur heard. Pulmonary:     Effort: Pulmonary effort is normal. No respiratory distress.     Breath sounds: No wheezing, rhonchi or rales.  Abdominal:     Palpations: Abdomen is soft.  Musculoskeletal:     Right lower leg: Edema present.  Neurological:     Mental Status: She is alert.  Psychiatric:        Mood and Affect: Mood normal.        Behavior: Behavior normal.     Data Reviewed: AFVSS K+ 3.4 Creatinine 3.35 > 3.30 WBC 15.3 > 23.8  Family Communication: husband at bedside  Disposition: Status is: Inpatient   Planned Discharge Destination: Skilled nursing facility    Time spent: 35 minutes  Author: Brendia Sacks, MD 10/14/2022 6:21 PM  For on call review [email or web address].

## 2022-10-14 NOTE — Plan of Care (Signed)
?  Problem: Coping: ?Goal: Level of anxiety will decrease ?Outcome: Progressing ?  ?Problem: Safety: ?Goal: Ability to remain free from injury will improve ?Outcome: Progressing ?  ?

## 2022-10-14 NOTE — Evaluation (Signed)
Clinical/Bedside Swallow Evaluation Patient Details  Name: Madison [name] MRN: [id number] Date of Birth: 1940-06-04  Today's Date: 10/14/2022 Time: SLP Start Time (ACUTE ONLY): 1548 SLP Stop Time (ACUTE ONLY): 1615 SLP Time Calculation (min) (ACUTE ONLY): 27 min  Past Medical History:  Past Medical History:  Diagnosis Date   Benign hypertension with chronic kidney disease, stage III (HCC)    Overview:  Last Assessment & Plan:  Usually the patient is hypertensive. However today her blood pressure is 105 systolic. She has been feeling fatigued with some lightheadedness. Part of this may be related to her lower blood pressure. Her pressure may be improved with her decrease in salt and fluid intake. I've instructed her to put her lisinopril/hctz on hold. 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 It was possibly reentrant. She does well with diltiazem. No change in therapy.   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   Thyroid disease    Urinary incontinence    Past Surgical History:  Past Surgical History:  Procedure Laterality Date   CATARACT EXTRACTION Left 8/11    CATARACT EXTRACTION Right 3/12   CHOLECYSTECTOMY  1994   Copsilotomy Laser Treatment Left 6/12   eye   ELBOW SURGERY  8/12   EYE SURGERY Left 09/2008   macular hole   EYE SURGERY Right 12/11   Lumbar Infusion  2011   MOLE REMOVAL  06/17/2015   TOTAL KNEE ARTHROPLASTY Left 6/11   TOTAL KNEE ARTHROPLASTY Right 06/13/2011   HPI:  Patient is an 82 year old female admitted to [facility] long hospital with acute kidney injury on chronic kidney disease.  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  Spouse reported to RN that patient coughs some during eating and after.  History of esophagram completed March 2024 showed tortuous distal esophagus, moderate size hiatal hernia, moderate esophageal dysmotility with tertiary contractions and retrograde flow of barium to upper esophagus.  Patient today reports she was not sensate to those deficits.  She does take a PPI daily and endorses gustatory changes since being on antibiotic.    Assessment / Plan / Recommendation  Clinical Impression  Patient presents with functional oral and pharyngeal swallow ability based on clinical evaluation.  Her known significant history of esophageal dysmotility and hiatal hernia are source of her deficits.  She endorses lack of sensation to dysmotility with retrograde propulsion on prior esophagram completed March/2024.  No indication of aspiration across all p.o. trials during SLP session including  consumption of graham crackers, applesauce, and water.  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  Patient is noted to become slightly wheezy and she reports this occurs with any effort.  Recommend continue diet as tolerated implementing esophageal precautions.  In the past patient has used applesauce to swallow potassium pills that are difficult for her but she  reports that she is able to manage swallowing them with liquids.  No SLP follow-up indicated, thank you so much for this consult. SLP Visit Diagnosis: Dysphagia, unspecified (R13.10)    Aspiration Risk  No limitations    Diet Recommendation Regular;Thin liquid   Liquid Administration via: Cup;Straw Medication Administration: Whole meds with liquid Supervision: Patient able to self feed Compensations: Small sips/bites;Slow rate Postural Changes: Seated upright at 90 degrees;Remain upright for at least 30 minutes after po intake    Other  Recommendations Oral Care Recommendations: Oral care BID    Recommendations for follow up therapy are one component of a multi-disciplinary discharge planning process, led by the attending physician.  Recommendations may be updated based on patient status, additional functional criteria and insurance authorization.  Follow up Recommendations No SLP follow up      Assistance Recommended at Discharge  N/a  Functional Status Assessment    Frequency and Duration   N/a         Prognosis   N/a     Swallow Study   General Date of Onset: 10/14/22 HPI: Patient is an 82 year old female admitted to [facility] long hospital with acute kidney injury on chronic kidney disease.  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  Spouse reported to RN that patient coughs some during eating and after.  History of esophagram completed March 2024 showed tortuous distal esophagus, moderate size hiatal hernia, moderate esophageal dysmotility with tertiary contractions and retrograde flow of barium to upper esophagus.  Patient today reports she was not sensate to those deficits.  She does take a PPI daily and endorses gustatory changes since being on antibiotic. Type of Study: Bedside Swallow Evaluation Previous Swallow  Assessment: See HPI Diet Prior to this Study: Regular;Thin liquids (Level 0) Temperature Spikes Noted: No Respiratory Status: Room air History of Recent Intubation: No Behavior/Cognition: Alert;Cooperative;Pleasant mood Oral Cavity Assessment: Within Functional Limits Oral Care Completed by SLP: No Oral Cavity - Dentition: Adequate natural dentition Vision: Functional for self-feeding Self-Feeding Abilities: Able to feed self Patient Positioning: Upright in bed Baseline Vocal Quality: Normal Volitional Cough: Strong Volitional Swallow: Able to elicit    Oral/Motor/Sensory Function Overall Oral Motor/Sensory Function: Within functional limits   Ice Chips Ice chips: Not tested   Thin Liquid Thin Liquid: Within functional limits Presentation: Cup;Self Fed    Nectar Thick Nectar Thick Liquid: Not tested   Honey Thick Honey Thick Liquid: Not tested   Puree Puree: Within functional limits Presentation: Self Fed;Spoon   Solid     Solid: Within functional limits Presentation: Self Orvan July 10/14/2022,5:07 PM  Rolena Infante, MS [facility] SLP Acute Rehab Services Office [phone]

## 2022-10-14 NOTE — Progress Notes (Signed)
MEWS Progress Note  Patient Details Name: Madison [name] MRN: [id number] DOB: [birth year] Today's Date: 10/14/2022   MEWS Flowsheet Documentation:  Assess: MEWS Score Temp: 97.8 F (36.6 C) BP: (!) 88/44 MAP (mmHg): (!) 47 Pulse Rate: 93 Resp: 18 Level of Consciousness: Alert SpO2: 94 % O2 Device: Room Air Assess: MEWS Score MEWS Temp: 0 MEWS Systolic: 1 MEWS Pulse: 0 MEWS RR: 0 MEWS LOC: 0 MEWS Score: 1 MEWS Score Color: Green Assess: SIRS CRITERIA SIRS Temperature : 0 SIRS Respirations : 0 SIRS Pulse: 1 SIRS WBC: 0 SIRS Score Sum : 1 Assess: if the MEWS score is Yellow or Red Were vital signs taken at a resting state?: Yes Focused Assessment: Change from prior assessment (see assessment flowsheet) Does the patient meet 2 or more of the SIRS criteria?: No MEWS guidelines implemented : Yes, yellow Treat MEWS Interventions: Considered administering scheduled or prn medications/treatments as ordered Take Vital Signs Increase Vital Sign Frequency : Yellow: Q2hr x1, continue Q4hrs until patient remains green for 12hrs Escalate MEWS: Escalate: Yellow: Discuss with charge nurse and consider notifying provider and/or RRT Notify: Charge Nurse/RN Name of Charge Nurse/RN Notified: Salome Holmes, RN Provider Notification Provider Name/Title: B. Jon Billings, NP Date Provider Notified: 10/14/22 Time Provider Notified: 0016 Method of Notification: Page (secure chat message sent) Notification Reason: Change in status, Other (Comment) (yellow mews protocol for low systolic bp) Provider response: See new orders, Other (Comment) (250 cc NSS bolus ordered and initiated) Date of Provider Response: 10/14/22 Time of Provider Response: 0021 Notify: Rapid Response Name of Rapid Response RN Notified: not necessary at this time      Kizzie Bane 10/14/2022, 12:59 AM

## 2022-10-14 NOTE — Progress Notes (Signed)
       CROSS COVER NOTE  NAME: Madison [name] MRN: [id number] DOB : 10/23/1940    Concern as stated by nurse / staff   Borderline low CP and heart rate in 90s. Low grade temp    Pertinent findings on chart review:   Assessment and  Interventions   Assessment:  Plan: two 240 ml NS boluses ordered Moriitor temp. Obtain lactic and blood cultures if sepsis criteria is med      Donnie Mesa NP [facility] Cross [employer] 7pm-7am - check amion for availability Pager [phone]

## 2022-10-14 NOTE — Plan of Care (Signed)

## 2022-10-15 ENCOUNTER — Encounter (HOSPITAL_COMMUNITY): Payer: Self-pay | Admitting: Family Medicine

## 2022-10-15 DIAGNOSIS — N189 Chronic kidney disease, unspecified: Secondary | ICD-10-CM | POA: Diagnosis not present

## 2022-10-15 DIAGNOSIS — N179 Acute kidney failure, unspecified: Secondary | ICD-10-CM | POA: Diagnosis not present

## 2022-10-15 DIAGNOSIS — I5032 Chronic diastolic (congestive) heart failure: Secondary | ICD-10-CM | POA: Diagnosis not present

## 2022-10-15 LAB — BASIC METABOLIC PANEL
Anion gap: 12 (ref 5–15)
BUN: 57 mg/dL — ABNORMAL HIGH (ref 8–23)
CO2: 17 mmol/L — ABNORMAL LOW (ref 22–32)
Calcium: 7.8 mg/dL — ABNORMAL LOW (ref 8.9–10.3)
Chloride: 102 mmol/L (ref 98–111)
Creatinine, Ser: 3.03 mg/dL — ABNORMAL HIGH (ref 0.44–1.00)
GFR, Estimated: 15 mL/min — ABNORMAL LOW (ref >60)
Glucose, Bld: 107 mg/dL — ABNORMAL HIGH (ref 70–99)
Potassium: 3.6 mmol/L (ref 3.5–5.1)
Sodium: 131 mmol/L — ABNORMAL LOW (ref 135–145)

## 2022-10-15 LAB — BLOOD GAS, ARTERIAL
Acid-base deficit: 4.8 mmol/L — ABNORMAL HIGH (ref 0.0–2.0)
Bicarbonate: 20.5 mmol/L (ref 20.0–28.0)
Drawn by: 29503
O2 Saturation: 97.4 %
Patient temperature: 37
pCO2 arterial: 38 mmHg (ref 32–48)
pH, Arterial: 7.34 — ABNORMAL LOW (ref 7.35–7.45)
pO2, Arterial: 75 mmHg — ABNORMAL LOW (ref 83–108)

## 2022-10-15 LAB — CBC
HCT: 36.6 % (ref 36.0–46.0)
Hemoglobin: 11.5 g/dL — ABNORMAL LOW (ref 12.0–15.0)
MCH: 27.8 pg (ref 26.0–34.0)
MCHC: 31.4 g/dL (ref 30.0–36.0)
MCV: 88.6 fL (ref 80.0–100.0)
Platelets: 156 10*3/uL (ref 150–400)
RBC: 4.13 MIL/uL (ref 3.87–5.11)
RDW: 14.4 % (ref 11.5–15.5)
WBC: 14.8 10*3/uL — ABNORMAL HIGH (ref 4.0–10.5)
nRBC: 0 % (ref 0.0–0.2)

## 2022-10-15 NOTE — Plan of Care (Signed)
  Problem: Education: Goal: Knowledge of General Education information will improve Description: Including pain rating scale, medication(s)/side effects and non-pharmacologic comfort measures Outcome: Progressing   Problem: Activity: Goal: Risk for activity intolerance will decrease Outcome: Progressing   Problem: Nutrition: Goal: Adequate nutrition will be maintained Outcome: Progressing   

## 2022-10-15 NOTE — [place] FL2 (Signed)
[place] MEDICAID FL2 LEVEL OF CARE FORM     IDENTIFICATION  Patient Name: Madison [name] Birthdate: 1941-03-25 Sex: female Admission Date (Current Location): 10/13/2022  [facility] and [place] Number:  Producer, television/film/video and Address:  [facility],  [address], [place] [address]      Provider Number: [id number]  Attending Physician Name and Address:  Standley Brooking, MD  Relative Name and Phone Number:  spouse, Taia Bramlett @ [phone]    Current Level of Care: Hospital Recommended Level of Care: Nursing Facility Prior Approval Number:    Date Approved/Denied:   PASRR Number: [id number]  Discharge Plan: SNF    Current Diagnoses: Patient Active Problem List   Diagnosis Date Noted   AKI (acute kidney injury) (HCC) 10/14/2022   Acute kidney injury (HCC) 10/13/2022   Acute kidney injury superimposed on chronic kidney disease (HCC) 10/13/2022   Muscle spasm 09/05/2022   Medication management 07/27/2022   Cellulitis of left thigh 07/27/2022   Cutaneous abscess of right hip 07/21/2022   Hyponatremia 07/21/2022   Vaginal bleeding 07/20/2022   Multifocal pneumonia 07/08/2022   Leukocytosis 07/08/2022   Acute on chronic diastolic CHF (congestive heart failure) (HCC) 07/08/2022   Chronic anemia 07/08/2022   Infected abrasion of right hip, initial encounter 07/03/2022   Cellulitis of right hip 07/03/2022   Atypical chest pain 07/03/2022   Chronic diastolic CHF (congestive heart failure) (HCC) 07/03/2022   Weight gain 06/27/2022   Malaise and fatigue 06/27/2022   COVID-19 virus infection 05/16/2022   Ingrown left greater toenail 04/12/2022   Dysuria 04/12/2022   Absence of hip joint, 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   Gait disorder 01/27/2022   Pressure ulcer, stage 2 (HCC) 07/12/2021   Candidiasis of skin 04/13/2021   UTI (urinary tract infection) 04/02/2021   Hypokalemia 02/26/2021   Pressure ulcer of thigh, stage 2 (HCC) 02/19/2021   Elevated liver enzymes  02/16/2021   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   Right hip pain 02/05/2021   Sepsis (HCC) 02/05/2021   Blood loss anemia 02/05/2021   Rheumatoid arthritis (HCC) 02/05/2021   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   Acquired hypothyroidism 02/05/2021   Drug rash 02/05/2021   Stage 3a chronic kidney disease (CKD) (HCC) 01/30/2019   Astigmatism with presbyopia, bilateral 01/22/2019   Blepharitis of both upper and lower eyelid 01/22/2019   Dermatochalasis of both upper eyelids 01/22/2019   Dry eyes, bilateral 01/22/2019   Epiretinal membrane (ERM) of both eyes 01/22/2019   Meibomian gland dysfunction (MGD) of both eyes 01/22/2019   Pseudophakia of both eyes 01/22/2019   Vitreous syneresis of both eyes 01/22/2019   Acute cystitis without hematuria 11/01/2018   E-coli UTI 06/28/2018   Malodorous urine 06/28/2018   Class 3 obesity (HCC) 09/25/2017   Obstructive sleep apnea    Nasal sore 10/07/2016   Metabolic bone disease 09/01/2015   Vitamin D deficiency 09/01/2015   Chronic diarrhea 07/27/2015   Edema of lower extremity 11/11/2014   Cardiac disease 03/10/2014   Heart disease 03/10/2014   Encounter for preprocedural cardiovascular examination    Essential hypertension    GERD (gastroesophageal reflux disease)    Adiposity    Supraventricular tachycardia (HCC)    Decreased cardiac output    Urinary incontinence     Orientation RESPIRATION BLADDER Height & Weight     Self, Time, Situation, Place  Normal Continent, External catheter Weight: 295 lb 3.1 oz (133.9 kg) Height:  5\' 8"  (172.7  cm)  BEHAVIORAL SYMPTOMS/MOOD NEUROLOGICAL BOWEL NUTRITION STATUS      Continent Diet (Regular)  AMBULATORY STATUS COMMUNICATION OF NEEDS Skin   Total Care Verbally Other (Comment) (small, round "pencil eraser" sized wound right thigh with xeroform packing)                       Personal Care Assistance Level of Assistance              Functional  Limitations Info  Sight, Hearing, Speech Sight Info: Adequate Hearing Info: Adequate Speech Info: Adequate    SPECIAL CARE FACTORS FREQUENCY                       Contractures Contractures Info: Not present    Additional Factors Info  Code Status, Allergies Code Status Info: Full Allergies Info: Keflex (Cephalexin), Macrobid (Nitrofurantoin), Amoxicillin           Current Medications (10/15/2022):  This is the current hospital active medication list Current Facility-Administered Medications  Medication Dose Route Frequency Provider Last Rate Last Admin   acetaminophen (TYLENOL) tablet 650 mg  650 mg Oral Q6H PRN Charlsie Quest, MD       Or   acetaminophen (TYLENOL) suppository 650 mg  650 mg Rectal Q6H PRN Darreld Mclean R, MD       albuterol (PROVENTIL) (2.5 MG/3ML) 0.083% nebulizer solution 2.5 mg  2.5 mg Nebulization Q4H PRN Charlsie Quest, MD       Chlorhexidine Gluconate Cloth 2 % PADS 6 each  6 each Topical Daily Charlsie Quest, MD   6 each at 10/14/22 0928   gabapentin (NEURONTIN) capsule 300 mg  300 mg Oral QHS Darreld Mclean R, MD   300 mg at 10/14/22 2104   heparin injection 5,000 Units  5,000 Units Subcutaneous Q8H Charlsie Quest, MD   5,000 Units at 10/15/22 7829   lactated ringers infusion   Intravenous Continuous Standley Brooking, MD 125 mL/hr at 10/14/22 1450 Rate Change at 10/14/22 1450   levothyroxine (SYNTHROID) tablet 25 mcg  25 mcg Oral Elberta Spaniel, MD   25 mcg at 10/15/22 0548   levothyroxine (SYNTHROID) tablet 50 mcg  50 mcg Oral Tanna Furry R, MD   50 mcg at 10/14/22 0506   mupirocin ointment (BACTROBAN) 2 % 1 Application  1 Application Nasal BID Charlsie Quest, MD   1 Application at 10/15/22 0931   ondansetron (ZOFRAN) tablet 4 mg  4 mg Oral Q6H PRN Charlsie Quest, MD       Or   ondansetron [facility]) injection 4 mg  4 mg Intravenous Q6H PRN Darreld Mclean R, MD   4 mg at 10/13/22 2327   pantoprazole (PROTONIX) EC tablet 40 mg   40 mg Oral Daily Charlsie Quest, MD   40 mg at 10/14/22 0925   senna-docusate (Senokot-S) tablet 1 tablet  1 tablet Oral QHS PRN Charlsie Quest, MD         Discharge Medications: Please see discharge summary for a list of discharge medications.  Relevant Imaging Results:  Relevant Lab Results:   Additional Information SSN:[id number]  Nery Kalisz, LCSW

## 2022-10-15 NOTE — Progress Notes (Signed)
  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  Found to have significant AKI and admitted for further evaluation and treatment. She has had issues with recurrent right hip infection, last admitted in March for the same.  She continues to receive wound care to the right hip with occasional packing.  There has not been any purulent discharge or erythema in the area.  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 Very slow improvement in renal function.  Continue IV fluids.  Daily BMP.   Leukocytosis: Significantly elevated, no obvious acute infectious etiology.  Now trending down. Continue care for chronic right hip.  Abdomen nontender.  CXR without evidence of pneumonia. Continue wound care.   Chronic HFpEF: Has chronic right foot edema secondary to lack of use of this extremity.  No significant left lower extremity. Overall appears to be dry.  Continue IV fluids. Hold diuretics.   Essential hypertension: Blood pressure is labile.  Continue holding Lasix and spironolactone.  On IV fluids as above.   S/p right hip hardware removal (12/2020) due to prior prosthetic joint infection with chronic nonambulatory status: Continue chronic wound care.   Hypothyroidism: Continue Synthroid.   OSA: Stopped using CPAP secondary to machine malfunction.  Resume.      Subjective:  Sleepy today, did sleep well last night Eating ok  Physical Exam: Vitals:   10/15/22 0523 10/15/22  1031 10/15/22 1328 10/15/22 1348  BP: 109/82   (!) 84/48  Pulse: 96   91  Resp: 18   18  Temp: 98.3 F (36.8 C) 98.3 F (36.8 C)  98.3 F (36.8 C)  TempSrc: Oral Oral  Oral  SpO2: 97%  92% 93%  Weight:      Height:       Physical Exam Vitals reviewed.  Constitutional:      General: She is not in acute distress.    Appearance: She is not ill-appearing or toxic-appearing.  Cardiovascular:     Rate and Rhythm: Normal rate and regular rhythm.     Heart sounds: No murmur heard. Pulmonary:     Effort: Pulmonary effort is normal. No respiratory distress.     Breath sounds: No wheezing, rhonchi or rales.  Neurological:     Mental Status: She is alert.  Psychiatric:        Mood and Affect: Mood normal.        Behavior: Behavior normal.     Data Reviewed: UOP 1300 Na+ stable 131 BUN w/o significant change 57 Creatinine slight improvement 3.3 > 3.03 WBC 23.8 > 14.8  Family Communication: husband at bedside  Disposition: Status is: Inpatient Remains inpatient appropriate because: AKI  Planned Discharge Destination: Skilled nursing facility    Time spent: 35 minutes  Author: Brendia Sacks, MD 10/15/2022 4:04 PM  For on call review [email or web address].

## 2022-10-15 NOTE — Progress Notes (Signed)
Pt declined breakfast.  Also refused to be repositioned at this time.

## 2022-10-15 NOTE — Progress Notes (Signed)
   10/15/22 2309  BiPAP/CPAP/SIPAP  $ Non-Invasive Home Ventilator  Initial  $ Face Mask Medium Yes  BiPAP/CPAP/SIPAP Pt Type Adult  BiPAP/CPAP/SIPAP DREAMSTATIOND  Mask Type Full face mask  Mask Size Medium  Flow Rate 3 lpm  Patient Home Equipment No  Auto Titrate Yes (5-20 cmH2O)

## 2022-10-16 ENCOUNTER — Inpatient Hospital Stay (HOSPITAL_COMMUNITY): Payer: Medicare Other

## 2022-10-16 DIAGNOSIS — I10 Essential (primary) hypertension: Secondary | ICD-10-CM

## 2022-10-16 DIAGNOSIS — I5032 Chronic diastolic (congestive) heart failure: Secondary | ICD-10-CM | POA: Diagnosis not present

## 2022-10-16 DIAGNOSIS — N189 Chronic kidney disease, unspecified: Secondary | ICD-10-CM | POA: Diagnosis not present

## 2022-10-16 DIAGNOSIS — N179 Acute kidney failure, unspecified: Secondary | ICD-10-CM | POA: Diagnosis not present

## 2022-10-16 LAB — BASIC METABOLIC PANEL
Anion gap: 9 (ref 5–15)
BUN: 62 mg/dL — ABNORMAL HIGH (ref 8–23)
CO2: 20 mmol/L — ABNORMAL LOW (ref 22–32)
Calcium: 8 mg/dL — ABNORMAL LOW (ref 8.9–10.3)
Chloride: 102 mmol/L (ref 98–111)
Creatinine, Ser: 2.99 mg/dL — ABNORMAL HIGH (ref 0.44–1.00)
GFR, Estimated: 15 mL/min — ABNORMAL LOW (ref >60)
Glucose, Bld: 119 mg/dL — ABNORMAL HIGH (ref 70–99)
Potassium: 3.7 mmol/L (ref 3.5–5.1)
Sodium: 131 mmol/L — ABNORMAL LOW (ref 135–145)

## 2022-10-16 LAB — CBC
HCT: 34.9 % — ABNORMAL LOW (ref 36.0–46.0)
Hemoglobin: 10.7 g/dL — ABNORMAL LOW (ref 12.0–15.0)
MCH: 27.5 pg (ref 26.0–34.0)
MCHC: 30.7 g/dL (ref 30.0–36.0)
MCV: 89.7 fL (ref 80.0–100.0)
Platelets: 159 10*3/uL (ref 150–400)
RBC: 3.89 MIL/uL (ref 3.87–5.11)
RDW: 14.6 % (ref 11.5–15.5)
WBC: 16.9 10*3/uL — ABNORMAL HIGH (ref 4.0–10.5)
nRBC: 0 % (ref 0.0–0.2)

## 2022-10-16 LAB — SODIUM, URINE, RANDOM: Sodium, Ur: 48 mmol/L

## 2022-10-16 LAB — CREATININE, URINE, RANDOM: Creatinine, Urine: 24 mg/dL

## 2022-10-16 NOTE — Plan of Care (Signed)
  Problem: Coping: Goal: Level of anxiety will decrease Outcome: Progressing   Problem: Pain Managment: Goal: General experience of comfort will improve Outcome: Progressing   

## 2022-10-16 NOTE — Progress Notes (Signed)
  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  Found to have significant AKI and admitted for further evaluation and treatment. She has had issues with recurrent right hip infection, last admitted in March for the same.  She continues to receive wound care to the right hip with occasional packing.  There has not been any purulent discharge or erythema in the area.  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 Very slow improvement in renal function.  Continue IV fluids.  Daily BMP. Check renal US and urine studies given slow improvement. UOP is only partially recorded   Leukocytosis: Significantly elevated, no obvious acute infectious etiology.    Continue care for chronic right hip.  Abdomen nontender.  CXR without evidence of pneumonia. Continue wound care.   Chronic HFpEF: Has chronic right foot edema secondary to lack of use of this extremity.    Hold diuretics.   Essential hypertension: Blood pressure stable.  Continue holding Lasix and spironolactone.  On IV fluids as above.   S/p right hip hardware removal (12/2020) due to prior prosthetic joint infection with chronic nonambulatory status: Continue chronic wound care.   Hypothyroidism: Continue Synthroid.   OSA: Stopped using CPAP secondary to machine malfunction.  Resume.      Subjective:  Unable to tolerate CPAP  Feels better today Eating ok Often starts wheezing after eating   Physical Exam: Vitals:    10/15/22 2015 10/16/22 0551 10/16/22 1318 10/16/22 1653  BP: (!) 101/53 (!) 104/59 116/66   Pulse: 78 71 72   Resp: 18 18 16    Temp: 97.9 F (36.6 C) 97.6 F (36.4 C) 97.8 F (36.6 C)   TempSrc: Oral  Oral   SpO2: 99% 99% 99% 99%  Weight:      Height:       Physical Exam Vitals reviewed.  Constitutional:      General: She is not in acute distress.    Appearance: She is not ill-appearing or toxic-appearing.  Cardiovascular:     Rate and Rhythm: Normal rate and regular rhythm.     Heart sounds: No murmur heard. Pulmonary:     Effort: Pulmonary effort is normal. No respiratory distress.     Breath sounds: No wheezing (upper airway expiratory), rhonchi or rales.  Neurological:     Mental Status: She is alert.  Psychiatric:        Mood and Affect: Mood normal.        Behavior: Behavior normal.     Data Reviewed: UOP 50 + multiple voids  Family Communication: husband at bedside  Disposition: Status is: Inpatient Remains inpatient appropriate because: AKI  Planned Discharge Destination: Skilled nursing facility    Time spent: 20 minutes  Author: Brendia Sacks, MD 10/16/2022 5:49 PM  For on call review [email or web address].

## 2022-10-16 NOTE — Progress Notes (Signed)
Patient refusing cpap now, unable to tolerate mask (had only used the nasal pillow in the past), placed back on 2 liters nasal cannula.

## 2022-10-16 NOTE — Progress Notes (Signed)
   10/16/22 2000  BiPAP/CPAP/SIPAP  Reason BIPAP/CPAP not in use Other(comment) (patient can't tolerate)

## 2022-10-16 NOTE — Progress Notes (Signed)
There is an order to document patient oxygen saturation with ambulation, however patient is non-ambulatory. O2 level was checked with turning and repositioning in bed and lowest number was 96%. Once repositioned, O2 saturation came right up to 99% on room air, expiratory wheezes noted throughout care but patient denies any shortness of breath, will continue to monitor.

## 2022-10-17 DIAGNOSIS — N179 Acute kidney failure, unspecified: Secondary | ICD-10-CM | POA: Diagnosis not present

## 2022-10-17 DIAGNOSIS — E039 Hypothyroidism, unspecified: Secondary | ICD-10-CM | POA: Diagnosis not present

## 2022-10-17 DIAGNOSIS — D72829 Elevated white blood cell count, unspecified: Secondary | ICD-10-CM | POA: Diagnosis not present

## 2022-10-17 DIAGNOSIS — I10 Essential (primary) hypertension: Secondary | ICD-10-CM | POA: Diagnosis not present

## 2022-10-17 LAB — BASIC METABOLIC PANEL
Anion gap: 9 (ref 5–15)
BUN: 58 mg/dL — ABNORMAL HIGH (ref 8–23)
CO2: 20 mmol/L — ABNORMAL LOW (ref 22–32)
Calcium: 8 mg/dL — ABNORMAL LOW (ref 8.9–10.3)
Chloride: 103 mmol/L (ref 98–111)
Creatinine, Ser: 2.63 mg/dL — ABNORMAL HIGH (ref 0.44–1.00)
GFR, Estimated: 18 mL/min — ABNORMAL LOW (ref >60)
Glucose, Bld: 99 mg/dL (ref 70–99)
Potassium: 3.9 mmol/L (ref 3.5–5.1)
Sodium: 132 mmol/L — ABNORMAL LOW (ref 135–145)

## 2022-10-17 NOTE — Progress Notes (Signed)
   10/17/22 2032  BiPAP/CPAP/SIPAP  Reason BIPAP/CPAP not in use Non-compliant

## 2022-10-17 NOTE — TOC Progression Note (Signed)
Transition of Care [facility]) - Progression Note   Patient Details  Name: Madison [name] MRN: [id number] Date of Birth: 08/29/1940  Transition of [facility]) CM/SW Contact  Ewing Schlein, LCSW Phone Number: 10/17/2022, 12:07 PM  Clinical Narrative: CSW spoke with Katie in admissions at [facility] and confirmed the facility can accept the patient back to SNF LTC at discharge. No insurance authorization will be needed. TOC to follow.    Expected Discharge Plan: Skilled Nursing Facility Barriers to Discharge: Continued Medical Work up  Expected Discharge Plan and Services Post Acute Care Choice: Nursing Home Living arrangements for the past 2 months: Skilled Nursing Facility           DME Arranged: N/A DME Agency: NA  Social Determinants of Health (SDOH) Interventions SDOH Screenings   Food Insecurity: No Food Insecurity (10/13/2022)  Housing: Low Risk  (10/13/2022)  Transportation Needs: No Transportation Needs (10/13/2022)  Utilities: Not At Risk (10/13/2022)  Depression (PHQ2-9): Low Risk  (07/26/2022)  Tobacco Use: Medium Risk (10/15/2022)   Readmission Risk Interventions     No data to display

## 2022-10-17 NOTE — Evaluation (Signed)
Clinical/Bedside Swallow Evaluation Patient Details  Name: Madison [name] MRN: [id number] Date of Birth: [birth year]  Today's Date: 10/17/2022 Time: SLP Start Time (ACUTE ONLY): 0920 SLP Stop Time (ACUTE ONLY): 0940 SLP Time Calculation (min) (ACUTE ONLY): 20 min  Past Medical History:  Past Medical History:  Diagnosis Date   Benign hypertension with chronic kidney disease, stage III (HCC)    Overview:  Last Assessment & Plan:  Usually the patient is hypertensive. However today her blood pressure is 105 systolic. She has been feeling fatigued with some lightheadedness. Part of this may be related to her lower blood pressure. Her pressure may be improved with her decrease in salt and fluid intake. I've instructed her to put her lisinopril/hctz on hold. 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 It was possibly reentrant. She does well with diltiazem. No change in therapy.   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  Spouse reported to RN that patient coughs some during eating and after.  History of esophagram completed March 2024 showed tortuous distal esophagus, moderate size hiatal hernia, moderate esophageal dysmotility with tertiary contractions and retrograde flow of barium to upper esophagus.   She does take a PPI daily and endorses gustatory changes since being on antibiotic. CXR without evidence of PNA. She told SLP that on Sunday, 10/09/22, she felt nauseated after dinner and vomitted. Then next morning she felt nauseated again without having had any PO's. When asked about her current appetite, she reported, "I'm eating more here than at the home(referring to SNF)". Bedside swallow evaluation completed on 10/13/22 with SLP s/o due to suspected primary esophageal nature of patient's dysphagia . SLP swallow evaluation re-ordered on 6/23.    Assessment / Plan /  Recommendation  Clinical Impression  Patient is not currently presenting with clinical s/s of dysphagia as per this bedside swallow evaluation. SLP was able to assess her PO toleration with breakfast meal tray which included puree solids, thin liquids. (patient is on regular solids, thin liquids diet but she did not order any solids) Swallow initiation was timely with thin liquids and puree solids and no overt s/s  aspiration observed even with consecutive straw sips of thin liquids. Patient herself has not noticed any change in  her swallow function as compared to her baseline. SLP did observe patient to have expiratory wheezing which seemed related to exertion from talking and eating. This wheezing was observed on 10/14/22 bedside swallow evaluation with patient reporting at that time that it occurs with any effort. SLP reviewed images from March 2024 esophagram and it did not report or show any penetration or aspiration of thin liquids. SLP is not recommending further skilled intervention, but if any future concern for aspiration of PO's, please order MBS (SLP modified barium swallow study). SLP Visit Diagnosis: Dysphagia, unspecified (R13.10)    Aspiration Risk  No limitations    Diet Recommendation Regular;Thin liquid   Liquid Administration via: Cup;Straw Medication Administration: Whole meds with liquid Supervision: Patient able to self feed Compensations: Small sips/bites;Slow rate Postural Changes: Seated upright at 90 degrees;Remain upright for at least 30 minutes after po intake    Other  Recommendations Oral Care Recommendations: Oral care BID    Recommendations for follow up therapy are one component of a multi-disciplinary discharge planning process, led by the attending physician.  Recommendations may be updated based on patient status, additional functional criteria and insurance authorization.  Follow up Recommendations No SLP follow up      Assistance Recommended at Discharge    Functional Status Assessment Patient has not had a recent decline in their functional status  Frequency and Duration   N/A         Prognosis   N/A     Swallow Study   General Date of Onset: 10/14/22 HPI: Patient is an 82 year old female admitted to [facility] long hospital with acute kidney injury on chronic kidney disease.  Past medical history significant for recurrent hip infection causing her to be  bedbound for several years, GERD and esophageal dysmotility, heart failure, obesity, and she has a full CODE STATUS.  Spouse reported to RN that patient coughs some during eating and after.  History of esophagram completed March 2024 showed tortuous distal esophagus, moderate size hiatal hernia, moderate esophageal dysmotility with tertiary contractions and retrograde flow of barium to upper esophagus.   She does take a PPI daily and endorses gustatory changes since being on antibiotic. CXR without evidence of PNA. She told SLP that on Sunday, 10/09/22, she felt nauseated after dinner and vomitted. Then next morning she felt nauseated again without having had any PO's. When asked about her current appetite, she reported, "I'm eating more here than at the home(referring to SNF)". Bedside swallow evaluation completed on 10/13/22 with SLP s/o due to suspected primary esophageal nature of patient's dysphagia . SLP swallow evaluation re-ordered on 6/23. Type of Study: Bedside Swallow Evaluation Previous Swallow Assessment: 10/14/2022 Diet Prior to this Study: Regular;Thin liquids (Level 0) Temperature Spikes Noted: No Respiratory Status: Room air History of Recent Intubation: No Behavior/Cognition: Alert;Cooperative;Pleasant mood Oral Cavity Assessment: Within Functional Limits Oral Care Completed by SLP: No Oral Cavity - Dentition: Adequate natural dentition Vision: Functional for self-feeding Self-Feeding Abilities: Able to feed self  Patient Positioning: Upright in bed Baseline Vocal Quality: Normal Volitional Cough: Strong Volitional Swallow: Able to elicit    Oral/Motor/Sensory Function Overall Oral Motor/Sensory Function: Within functional limits   Ice Chips     Thin Liquid Thin Liquid: Within functional limits Presentation: Straw;Self Fed    Nectar Thick     Honey Thick     Puree Puree: Within functional limits Presentation: Self Fed;Spoon   Solid     Solid: Not tested      Angela Nevin, MA, CCC-SLP Speech Therapy

## 2022-10-17 NOTE — Progress Notes (Addendum)
  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  Found to have significant AKI and admitted for further evaluation and treatment. She has had issues with recurrent right hip infection, last admitted in March for the same.  She continues to receive wound care to the right hip with occasional packing.     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 Very slow improvement in renal function continues.  Continue IV fluids.  Daily BMP. Renal US -- no obstruction  Urine studies -- FeNa 4.6%  Will need outpatient follow-up with nephrology   Leukocytosis: Significantly elevated, no obvious acute infectious etiology.    Continue care for chronic right hip.  Abdomen nontender.  CXR without evidence of pneumonia. Continue wound care.   Chronic HFpEF: Has chronic right foot edema secondary to lack of use of this extremity.    Holding diuretics.   Essential hypertension: Blood pressure stable.  Continue holding Lasix and spironolactone.  On IV fluids as above.   S/p right hip hardware removal (12/2020) due to prior prosthetic joint infection with chronic nonambulatory status: Continue chronic wound care. Outpatient ID followup recommended   Hypothyroidism: Continue Synthroid.   OSA: Stopped using CPAP secondary to machine malfunction.  Resume.       Subjective:  Refusing CPAP Not hypoxic per nursing note  Feels better  Physical Exam: Vitals:   10/16/22 2130 10/17/22 0549  10/17/22 0911 10/17/22 1326  BP: (!) 105/56 108/63  (!) 148/73  Pulse: 79 73  67  Resp: 17 18  17   Temp: 97.8 F (36.6 C) 97.7 F (36.5 C)  97.6 F (36.4 C)  TempSrc: Oral Oral  Oral  SpO2: 95% 94%  96%  Weight:   (!) 142.4 kg   Height:       Physical Exam Vitals reviewed.  Constitutional:      General: She is not in acute distress.    Appearance: She is not ill-appearing or toxic-appearing.  Cardiovascular:     Rate and Rhythm: Normal rate and regular rhythm.     Heart sounds: No murmur heard. Pulmonary:     Effort: Pulmonary effort is normal. No respiratory distress.     Breath sounds: No wheezing, rhonchi or rales.  Musculoskeletal:     Right lower leg: Edema present.     Left lower leg: Edema present.  Neurological:     Mental Status: She is alert.  Psychiatric:        Mood and Affect: Mood normal.        Behavior: Behavior normal.     Data Reviewed: Na+ 132 Creatinine 3.03 > 2.99 > 2.64 BUN 57 > 62 > 58 UOP 2050 I/O inaccurate  Family Communication: husband at bedside  Disposition: Status is: Inpatient Remains inpatient appropriate because: AKI  Planned Discharge Destination: Skilled nursing facility    Time spent: 25 minutes  Author: Brendia Sacks, MD 10/17/2022 2:24 PM  For on call review [email or web address].

## 2022-10-17 NOTE — Plan of Care (Signed)

## 2022-10-18 DIAGNOSIS — I10 Essential (primary) hypertension: Secondary | ICD-10-CM | POA: Diagnosis not present

## 2022-10-18 DIAGNOSIS — N179 Acute kidney failure, unspecified: Secondary | ICD-10-CM | POA: Diagnosis not present

## 2022-10-18 DIAGNOSIS — D72829 Elevated white blood cell count, unspecified: Secondary | ICD-10-CM | POA: Diagnosis not present

## 2022-10-18 DIAGNOSIS — I5032 Chronic diastolic (congestive) heart failure: Secondary | ICD-10-CM | POA: Diagnosis not present

## 2022-10-18 DIAGNOSIS — G4733 Obstructive sleep apnea (adult) (pediatric): Secondary | ICD-10-CM

## 2022-10-18 LAB — BASIC METABOLIC PANEL
Anion gap: 7 (ref 5–15)
BUN: 49 mg/dL — ABNORMAL HIGH (ref 8–23)
CO2: 21 mmol/L — ABNORMAL LOW (ref 22–32)
Calcium: 8 mg/dL — ABNORMAL LOW (ref 8.9–10.3)
Chloride: 106 mmol/L (ref 98–111)
Creatinine, Ser: 2.12 mg/dL — ABNORMAL HIGH (ref 0.44–1.00)
GFR, Estimated: 23 mL/min — ABNORMAL LOW (ref >60)
Glucose, Bld: 91 mg/dL (ref 70–99)
Potassium: 3.9 mmol/L (ref 3.5–5.1)
Sodium: 134 mmol/L — ABNORMAL LOW (ref 135–145)

## 2022-10-18 NOTE — TOC Progression Note (Signed)
Transition of Care [facility]) - Progression Note   Patient Details  Name: Madison [name] MRN: [id number] Date of Birth: 1941-03-02  [facility]) CM/SW Contact  Ewing Schlein, LCSW Phone Number: 10/18/2022, 11:41 AM  Clinical Narrative: Patient is expected to be medically ready to return to [facility] in 1-2 days. CSW followed up with patient regarding coordinating her return to [facility].  Expected Discharge Plan: Skilled Nursing Facility Barriers to Discharge: Continued Medical Work up  Expected Discharge Plan and Services Post Acute Care Choice: Nursing Home Living arrangements for the past 2 months: Skilled Nursing Facility              DME Arranged: N/A DME Agency: NA  Social Determinants of Health (SDOH) Interventions SDOH Screenings   Food Insecurity: No Food Insecurity (10/13/2022)  Housing: Low Risk  (10/13/2022)  Transportation Needs: No Transportation Needs (10/13/2022)  Utilities: Not At Risk (10/13/2022)  Depression (PHQ2-9): Low Risk  (07/26/2022)  Tobacco Use: Medium Risk (10/15/2022)   Readmission Risk Interventions    10/17/2022   12:09 PM  Readmission Risk Prevention Plan  Transportation Screening Complete  Home Care Screening Complete  Medication Review (RN CM) Complete

## 2022-10-18 NOTE — Progress Notes (Signed)
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  Found to have significant AKI and admitted for further evaluation and treatment. She has had issues with recurrent right hip infection, last admitted in March for the same.  She continues to receive wound care to the right hip with occasional packing. Treated with IVF with only very gradual improvement in renal function. Anticipate discharge in the next one to two days.  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 Very slow improvement in renal function continues. BUN down to 49, creatinine down to 2.12, baseline creatinine 1.2-1.3. Continue IV fluids.  Daily BMP. Renal US -- no obstruction  Urine studies -- FeNa 4.6%  UOP 1400+ Will need outpatient follow-up with nephrology --I called the office but [place] [facility] requires a referral which I could not place.  This will need to be placed by her skilled nursing facility physician.   Chronic HFpEF: Has chronic right foot edema secondary to lack of use of this extremity.    Holding diuretics.   Essential hypertension: Blood pressure stable.  Continue holding Lasix and spironolactone.  On IV fluids as above.   S/p right hip hardware removal (12/2020) due to prior prosthetic joint infection with chronic nonambulatory status: Continue chronic wound care. Outpatient ID followup recommended  Leukocytosis: Significantly elevated, no obvious acute infectious etiology.    Continue  care for chronic right hip.  Abdomen nontender.  CXR without evidence of pneumonia. Continue wound care.   Hypothyroidism: Continue Synthroid.   OSA: Stopped using CPAP secondary to machine malfunction as outpatient.  She has been refusing to use here as well.       Subjective:  Feels good today  Physical Exam: Vitals:   10/17/22 0911 10/17/22 1326 10/17/22 2105 10/18/22 0710  BP:  (!) 148/73 (!) 130/46   Pulse:  67 73   Resp:  17 18   Temp:  97.6 F (36.4 C) 97.9 F (36.6 C)   TempSrc:  Oral    SpO2:  96% 99%   Weight: (!) 142.4 kg   (!) 141.4 kg  Height:       Physical Exam Vitals reviewed.  Constitutional:      General: She is not in acute distress.    Appearance: She is not ill-appearing or toxic-appearing.  Cardiovascular:     Rate and Rhythm: Normal rate and regular rhythm.     Heart sounds: No murmur heard. Pulmonary:     Effort: Pulmonary effort is normal. No respiratory distress.     Breath sounds: No wheezing, rhonchi or rales.  Neurological:     Mental Status: She is alert.  Psychiatric:        Mood and Affect: Mood normal.        Behavior: Behavior normal.   Data Reviewed: Na+ up to 134 BUN down to 49 Creatinine down to 2.12  Family Communication:   Disposition: Status is: Inpatient Remains inpatient appropriate because: AKI  Planned Discharge Destination: Skilled nursing facility    Time spent: 20 anticipate discharge in the  next 1 to 2 days.  Minutes  Author: Brendia Sacks, MD 10/18/2022 2:13 PM  For on call review [email or web address].

## 2022-10-18 NOTE — Progress Notes (Signed)
   10/18/22 1942  BiPAP/CPAP/SIPAP  Reason BIPAP/CPAP not in use Non-compliant   Pt refused cpap again tonight.  Pt was encouraged to call should she change her mind.  RN aware.

## 2022-10-18 NOTE — Plan of Care (Signed)

## 2022-10-19 DIAGNOSIS — N179 Acute kidney failure, unspecified: Secondary | ICD-10-CM | POA: Diagnosis not present

## 2022-10-19 DIAGNOSIS — N189 Chronic kidney disease, unspecified: Secondary | ICD-10-CM | POA: Diagnosis not present

## 2022-10-19 LAB — CBC
HCT: 37.1 % (ref 36.0–46.0)
Hemoglobin: 11.5 g/dL — ABNORMAL LOW (ref 12.0–15.0)
MCH: 27.8 pg (ref 26.0–34.0)
MCHC: 31 g/dL (ref 30.0–36.0)
MCV: 89.6 fL (ref 80.0–100.0)
Platelets: 261 10*3/uL (ref 150–400)
RBC: 4.14 MIL/uL (ref 3.87–5.11)
RDW: 15 % (ref 11.5–15.5)
WBC: 11.9 10*3/uL — ABNORMAL HIGH (ref 4.0–10.5)
nRBC: 0 % (ref 0.0–0.2)

## 2022-10-19 LAB — BASIC METABOLIC PANEL
Anion gap: 6 (ref 5–15)
BUN: 42 mg/dL — ABNORMAL HIGH (ref 8–23)
CO2: 21 mmol/L — ABNORMAL LOW (ref 22–32)
Calcium: 7.9 mg/dL — ABNORMAL LOW (ref 8.9–10.3)
Chloride: 107 mmol/L (ref 98–111)
Creatinine, Ser: 2.04 mg/dL — ABNORMAL HIGH (ref 0.44–1.00)
GFR, Estimated: 24 mL/min — ABNORMAL LOW (ref >60)
Glucose, Bld: 88 mg/dL (ref 70–99)
Potassium: 3.7 mmol/L (ref 3.5–5.1)
Sodium: 134 mmol/L — ABNORMAL LOW (ref 135–145)

## 2022-10-19 NOTE — Discharge Summary (Signed)
Physician Discharge Summary  Madison [name] UYQ:[id number] DOB: [birth year]  PCP: Magdalene Patricia, MD  Admitted from: SNF Discharged to: Return to prior SNF.  Admit date: 10/13/2022 Discharge date: 10/19/2022  Recommendations for Outpatient Follow-up:    Contact information for follow-up providers     Heffner, Jimmye Norman, MD. Schedule an appointment as soon as possible for a visit.   Specialty: Family Medicine             Contact information for after-discharge care     Destination     [facility] .   Service: Skilled Nursing Why: Follow-up with MD at facility in 2 to 3 days.  Recommend repeating labs (BMP) twice a week until creatinine back to baseline.  Next lab draw for 10/21/2022.  Recommend outpatient nephrology consultation and MD SNF to coordinate.  Also recommend outpatient ID consultation and follow-up. Contact information: [address] [place] [address] [phone]                      Home Health: None    Equipment/Devices: TBD at [facility]    Discharge Condition: Improved and stable.   Code Status: Full Code Diet recommendation:  Discharge Diet Orders (From admission, onward)     Start     Ordered   10/19/22 0000  Diet - low sodium heart healthy        10/19/22 1113             Discharge Diagnoses:  Principal Problem:   Acute kidney injury superimposed on chronic kidney disease (HCC) Active Problems:   Chronic diastolic CHF (congestive heart failure) (HCC)   Leukocytosis   Essential hypertension   Acquired hypothyroidism   Absence of hip joint, right   Obstructive sleep apnea   AKI (acute kidney injury) (HCC)   Brief Summary: 82 year old woman PMH of chronic HFpEF, CKD stage IIIa, nonambulatory status due to prior right hip prosthesis infection s/p hardware removal 12/2020, HTN, hypothyroidism, RA, depression/anxiety, OSA noncompliant with CPAP, presented for nausea and vomiting  and poor oral intake. Found to have significant AKI and admitted for further evaluation and treatment. She has had issues with recurrent right hip infection, last admitted in March for the same. She continues to receive wound care to the right hip with occasional packing. Treated with IVF with only very gradual improvement in renal function.   Assessment and Plan:  AKI superimposed on CKD stage IIIa Secondary to intravascularly volume depletion due to GI losses from gastroenteritis as well as poor oral intake, complicated by furosemide and spironolactone. Very slow improvement in renal function continues. BUN down to 42, creatinine down to 2.04, baseline creatinine 1.2-1.3.  Admission labs: BUN 57, creatinine 3.35.  Patient was treated with several days of IV fluids with close daily BMP monitoring.  AKI continues to gradually improve.  Suspect that her creatinine will approach baseline in the next couple of days. Renal US -- no obstruction  Urine studies -- FeNa 4.6%  Has excellent urine output, 1550 documented yesterday. Will need outpatient follow-up with nephrology --Dr. Irene Limbo called the office but [place] [facility] requires a referral which he could not place.  This will need to be placed by her skilled nursing facility physician. Avoid nephrotoxics, hypotension, dehydration etc.   Chronic HFpEF: Has chronic right foot edema secondary to lack of use of this extremity.    Holding diuretics. On day of discharge, in fact noted bilateral lower extremity 2-3+ pitting  edema, likely multifactorial from IV fluid resuscitation, hypoalbuminemia and underlying CHF. Recommend bilateral knee-high compression stockings at SNF.  Also could consider resuming Lasix in the next couple of days as creatinine continues to improve with close monitoring of BMP for recurrent worsening.   Essential hypertension: Blood pressure stable.  Continue holding Lasix and spironolactone.     S/p right hip hardware removal  (12/2020) due to prior prosthetic joint infection with chronic nonambulatory status: Continue chronic wound care. Outpatient ID followup recommended Patient reports that she is nonambulatory since 2022.   Leukocytosis: Significantly elevated, no obvious acute infectious etiology.    Continue care for chronic right hip.  Abdomen nontender.  CXR without evidence of pneumonia. Continue wound care. WB C count has nearly normalized.   Hypothyroidism: Continue Synthroid.   OSA: Stopped using CPAP secondary to machine malfunction as outpatient.  She has been refusing to use here as well.      Consultations: None  Procedures: None   Discharge Instructions  Discharge Instructions     (HEART FAILURE PATIENTS) Call MD:  Anytime you have any of the following symptoms: 1) 3 pound weight gain in 24 hours or 5 pounds in 1 week 2) shortness of breath, with or without a dry hacking cough 3) swelling in the hands, feet or stomach 4) if you have to sleep on extra pillows at night in order to breathe.   Complete by: As directed    Call MD for:  difficulty breathing, headache or visual disturbances   Complete by: As directed    Call MD for:  extreme fatigue   Complete by: As directed    Call MD for:  persistant dizziness or light-headedness   Complete by: As directed    Call MD for:  persistant nausea and vomiting   Complete by: As directed    Call MD for:  redness, tenderness, or signs of infection (pain, swelling, redness, odor or green/yellow discharge around incision site)   Complete by: As directed    Call MD for:  severe uncontrolled pain   Complete by: As directed    Call MD for:  temperature >100.4   Complete by: As directed    Diet - low sodium heart healthy   Complete by: As directed    Discharge instructions   Complete by: As directed    Recommend bilateral knee high compression stockings at SNF.   Discharge wound care:   Complete by: As directed    Wound care  Daily       Comments: Wound care to right hip full thickness wound:  Cleanse with NS, pat dry. Fill defect with iodoform gauze packing strip [place] # [phone]) top with gauze, cover with silicone foam. Change packing daily   Increase activity slowly   Complete by: As directed         Medication List     STOP taking these medications    furosemide 40 MG tablet Commonly known as: LASIX   potassium chloride SA 20 MEQ tablet Commonly known as: KLOR-CON M   spironolactone 25 MG tablet Commonly known as: ALDACTONE       TAKE these medications    albuterol 108 (90 Base) MCG/ACT inhaler Commonly known as: VENTOLIN HFA Inhale 2 puffs into the lungs every 6 (six) hours as needed for wheezing or shortness of breath.   bifidobacterium infantis capsule Take 1 capsule by mouth daily.   CALCIUM PLUS VITAMIN D3 PO Take 1 tablet by mouth daily. Vitamin D3  + Calcium 600   ferrous sulfate 325 (65 FE) MG EC tablet Take 325 mg by mouth as directed. Once A Day on Mon, Thu   gabapentin 300 MG capsule Commonly known as: NEURONTIN Take 300 mg by mouth at bedtime.   ipratropium-albuterol 0.5-2.5 (3) MG/3ML Soln Commonly known as: DUONEB Inhale 3 mLs into the lungs every 8 (eight) hours as needed (wheezing).   levothyroxine 25 MCG tablet Commonly known as: SYNTHROID Give 1 tablet orally one time a day every 2 day(s); 2 tablets (50 mcg) one time a day every 2 day(s)   loperamide 2 MG capsule Commonly known as: IMODIUM Take 2 mg by mouth every 4 (four) hours as needed for diarrhea or loose stools.   nystatin powder Commonly known as: MYCOSTATIN/NYSTOP Apply 1 application  topically in the morning, at noon, in the evening, and at bedtime. To genital areas and inner thigh   nystatin cream Commonly known as: MYCOSTATIN Apply 1 Application topically at bedtime.   Protonix 40 MG tablet Generic drug: pantoprazole Take 40 mg by mouth daily.   triamcinolone cream 0.5 % Commonly known as:  KENALOG Apply 1 Application topically at bedtime.   Vitamin D3 50 MCG (2000 UT) Tabs Take 2 tablets by mouth daily.       Allergies  Allergen Reactions   Keflex [Cephalexin] Diarrhea   Macrobid [Nitrofurantoin] Hives   Amoxicillin Rash      Procedures/Studies: US RENAL  Result Date: 10/16/2022 CLINICAL DATA:  [id number] AKI (acute kidney injury) (HCC) [id number] EXAM: RENAL / URINARY TRACT ULTRASOUND COMPLETE COMPARISON:  None Available. FINDINGS: Right Kidney: Renal measurements: 12.6 x 6.2 x 6.7 cm = volume: 271 mL. Echogenicity within normal limits. No mass or hydronephrosis visualized. Left Kidney: Renal measurements: 11.2 x 5.0 x 6.7 cm = volume: 194 mL. Echogenicity within normal limits. No mass or hydronephrosis visualized. Bladder: Appears normal for degree of bladder distention. Other: None. IMPRESSION: No evidence of obstructive uropathy. Electronically Signed   By: Duanne Guess D.O.   On: 10/16/2022 16:26   DG Chest Portable 1 View  Result Date: 10/13/2022 CLINICAL DATA:  Weakness.  Abnormal labs. EXAM: PORTABLE CHEST 1 VIEW COMPARISON:  Radiograph 07/11/2022, CT 07/02/2022 FINDINGS: Chronic cardiomegaly. Unchanged mediastinal contours. Peribronchial thickening appears chronic. No evidence of focal airspace disease. No pneumothorax or large pleural effusion. On limited assessment, no acute osseous findings. Chronic right shoulder arthropathy. IMPRESSION: Chronic cardiomegaly. Chronic peribronchial thickening. No acute findings. Electronically Signed   By: Narda Rutherford M.D.   On: 10/13/2022 18:55      Subjective: "Am I going back to my nursing home today?".  States that she feels "fine".  Denies complaints.  Denies nausea, vomiting, diarrhea, dyspnea, chest pain or any other complaints.  Tolerating diet.  Had a BM today.  Patient's female RN was in the room during the course of her interview and exam.  Discharge Exam:  Vitals:   10/18/22 1358 10/18/22 1358 10/18/22 2217  10/19/22 0622  BP: 113/74 113/74 (!) 142/55 (!) 124/58  Pulse: 69 69 73 70  Resp: 17 17 18 16   Temp: 97.7 F (36.5 C) 97.7 F (36.5 C) 98 F (36.7 C)   TempSrc: Oral  Oral   SpO2: 98% 98% 99% 98%  Weight:      Height:        General: Pleasant elderly for female, morbidly obese, moderately built, sitting up comfortably in bed and getting ready to eat her breakfast ( Cardiovascular: S1 & S2  heard, RRR, S1/S2 +. No murmurs, rubs, gallops or clicks. No JVD.  2-3+ bilateral total pitting leg edema up to knees. Respiratory: Clear to auscultation without wheezing, rhonchi or crackles. No increased work of breathing. Abdominal:  Non distended, non tender & soft. No organomegaly or masses appreciated. Normal bowel sounds heard. CNS: Alert and oriented. No focal deficits. Extremities: no edema, no cyanosis.  Limited movements of right lower extremity due to above hip pathology, able to wiggle her toes and ankles without difficulty.  Left lower extremity with at least grade 3 x 5 power.    The results of significant diagnostics from this hospitalization (including imaging, microbiology, ancillary and laboratory) are listed below for reference.     Microbiology: Recent Results (from the past 240 hour(s))  SARS Coronavirus 2 by RT PCR (hospital order, performed in [facility] hospital lab) *cepheid single result test* Anterior Nasal Swab     Status: None   Collection Time: 10/13/22  8:41 PM   Specimen: Anterior Nasal Swab  Result Value Ref Range Status   SARS Coronavirus 2 by RT PCR NEGATIVE NEGATIVE Final    Comment: (NOTE) SARS-CoV-2 target nucleic acids are NOT DETECTED.  The SARS-CoV-2 RNA is generally detectable in upper and lower respiratory specimens during the acute phase of infection. The lowest concentration of SARS-CoV-2 viral copies this assay can detect is 250 copies / mL. A negative result does not preclude SARS-CoV-2 infection and should not be used as the sole basis for  treatment or other patient management decisions.  A negative result may occur with improper specimen collection / handling, submission of specimen other than nasopharyngeal swab, presence of viral mutation(s) within the areas targeted by this assay, and inadequate number of viral copies (<250 copies / mL). A negative result must be combined with clinical observations, patient history, and epidemiological information.  Fact Sheet for Patients:   RoadLapTop.co.za  Fact Sheet for Healthcare Providers: http://kim-miller.com/  This test is not yet approved or  cleared by the [place] FDA and has been authorized for detection and/or diagnosis of SARS-CoV-2 by FDA under an Emergency Use Authorization (EUA).  This EUA will remain in effect (meaning this test can be used) for the duration of the COVID-19 declaration under Section 564(b)(1) of the Act, 21 U.S.C. section 360bbb-3(b)(1), unless the authorization is terminated or revoked sooner.  Performed at [facility], [address], [place], [place] [address]   MRSA Next Gen by PCR, Nasal     Status: Abnormal   Collection Time: 10/14/22  1:38 AM   Specimen: Nasal Mucosa; Nasal Swab  Result Value Ref Range Status   MRSA by PCR Next Gen DETECTED (A) NOT DETECTED Final    Comment: (NOTE) The GeneXpert MRSA Assay (FDA approved for NASAL specimens only), is one component of a comprehensive MRSA colonization surveillance program. It is not intended to diagnose MRSA infection nor to guide or monitor treatment for MRSA infections. Test performance is not FDA approved in patients less than 1 years old. Performed at [facility], [address], [place], [place] [address]      Labs: CBC: Recent Labs  Lab 10/13/22 1855 10/14/22 0353 10/15/22 0349 10/16/22 1125 10/19/22 0339  WBC 15.3* 23.8* 14.8* 16.9* 11.9*  HGB 12.4 12.5 11.5* 10.7* 11.5*  HCT  39.0 40.2 36.6 34.9* 37.1  MCV 88.4 90.7 88.6 89.7 89.6  PLT 189 170 156 159 261    Basic Metabolic Panel: Recent Labs  Lab 10/15/22  0272 10/16/22 1125 10/17/22 0327 10/18/22 0338 10/19/22 0339  NA 131* 131* 132* 134* 134*  K 3.6 3.7 3.9 3.9 3.7  CL 102 102 103 106 107  CO2 17* 20* 20* 21* 21*  GLUCOSE 107* 119* 99 91 88  BUN 57* 62* 58* 49* 42*  CREATININE 3.03* 2.99* 2.63* 2.12* 2.04*  CALCIUM 7.8* 8.0* 8.0* 8.0* 7.9*    Liver Function Tests: Recent Labs  Lab 10/13/22 1855  AST 20  ALT 17  ALKPHOS 85  BILITOT 1.1  PROT 7.0  ALBUMIN 2.7*     Urinalysis    Component Value Date/Time   COLORURINE YELLOW 10/13/2022 2110   APPEARANCEUR CLEAR 10/13/2022 2110   LABSPEC 1.005 10/13/2022 2110   PHURINE 6.0 10/13/2022 2110   GLUCOSEU NEGATIVE 10/13/2022 2110   HGBUR SMALL (A) 10/13/2022 2110   BILIRUBINUR NEGATIVE 10/13/2022 2110   KETONESUR NEGATIVE 10/13/2022 2110   PROTEINUR NEGATIVE 10/13/2022 2110   UROBILINOGEN 0.2 12/30/2009 1134   NITRITE NEGATIVE 10/13/2022 2110   LEUKOCYTESUR MODERATE (A) 10/13/2022 2110      Time coordinating discharge: 35 minutes  SIGNED:  Marcellus Scott, MD,  FACP, FHM, [facility], [facility], [facility]   [facility] Ad[facility] [place]     To contact the attending provider between 7A-7P or the covering provider during after hours 7P-7A, please log into the web site [email or web address] and access using universal [place] password for that web site. If you do not have the password, please call the hospital operator.

## 2022-10-19 NOTE — Plan of Care (Signed)
Problem: Education: Goal: Knowledge of General Education information will improve Description: Including pain rating scale, medication(s)/side effects and non-pharmacologic comfort measures Outcome: Adequate for Discharge   Problem: Health Behavior/Discharge Planning: Goal: Ability to manage health-related needs will improve Outcome: Adequate for Discharge   Problem: Clinical Measurements: Goal: Ability to maintain clinical measurements within normal limits will improve Outcome: Adequate for Discharge Goal: Will remain free from infection Outcome: Adequate for Discharge Goal: Diagnostic test results will improve Outcome: Adequate for Discharge Goal: Respiratory complications will improve Outcome: Adequate for Discharge Goal: Cardiovascular complication will be avoided Outcome: Adequate for Discharge   Problem: Activity: Goal: Risk for activity intolerance will decrease Outcome: Adequate for Discharge   Problem: Nutrition: Goal: Adequate nutrition will be maintained Outcome: Adequate for Discharge   Problem: Coping: Goal: Level of anxiety will decrease Outcome: Adequate for Discharge   Problem: Pain Managment: Goal: General experience of comfort will improve Outcome: Adequate for Discharge   Problem: Safety: Goal: Ability to remain free from injury will improve Outcome: Adequate for Discharge   Problem: Skin Integrity: Goal: Risk for impaired skin integrity will decrease Outcome: Adequate for Discharge   Problem: Education: Goal: Knowledge of General Education information will improve Description: Including pain rating scale, medication(s)/side effects and non-pharmacologic comfort measures 10/19/2022 1226 by Julio Sicks, RN Outcome: Adequate for Discharge 10/19/2022 1226 by Julio Sicks, RN Outcome: Adequate for Discharge   Problem: Health Behavior/Discharge Planning: Goal: Ability to manage health-related needs will improve 10/19/2022 1226 by Julio Sicks, RN Outcome: Adequate for Discharge 10/19/2022 1226 by Julio Sicks, RN Outcome: Adequate for Discharge   Problem: Clinical Measurements: Goal: Ability to maintain clinical measurements within normal limits will improve 10/19/2022 1226 by Julio Sicks, RN Outcome: Adequate for Discharge 10/19/2022 1226 by Julio Sicks, RN Outcome: Adequate for Discharge Goal: Will remain free from infection 10/19/2022 1226 by Julio Sicks, RN Outcome: Adequate for Discharge 10/19/2022 1226 by Julio Sicks, RN Outcome: Adequate for Discharge Goal: Diagnostic test results will improve 10/19/2022 1226 by Julio Sicks, RN Outcome: Adequate for Discharge 10/19/2022 1226 by Julio Sicks, RN Outcome: Adequate for Discharge Goal: Respiratory complications will improve 10/19/2022 1226 by Julio Sicks, RN Outcome: Adequate for Discharge 10/19/2022 1226 by Julio Sicks, RN Outcome: Adequate for Discharge Goal: Cardiovascular complication will be avoided 10/19/2022 1226 by Julio Sicks, RN Outcome: Adequate for Discharge 10/19/2022 1226 by Julio Sicks, RN Outcome: Adequate for Discharge   Problem: Activity: Goal: Risk for activity intolerance will decrease 10/19/2022 1226 by Julio Sicks, RN Outcome: Adequate for Discharge 10/19/2022 1226 by Julio Sicks, RN Outcome: Adequate for Discharge   Problem: Nutrition: Goal: Adequate nutrition will be maintained 10/19/2022 1226 by Julio Sicks, RN Outcome: Adequate for Discharge 10/19/2022 1226 by Julio Sicks, RN Outcome: Adequate for Discharge   Problem: Coping: Goal: Level of anxiety will decrease 10/19/2022 1226 by Julio Sicks, RN Outcome: Adequate for Discharge 10/19/2022 1226 by Julio Sicks, RN Outcome: Adequate for Discharge   Problem: Pain Managment: Goal: General experience of comfort will improve 10/19/2022 1226 by Julio Sicks, RN Outcome: Adequate for Discharge 10/19/2022  1226 by Julio Sicks, RN Outcome: Adequate for Discharge   Problem: Safety: Goal: Ability to remain free from injury will improve 10/19/2022 1226 by Julio Sicks, RN Outcome: Adequate for Discharge 10/19/2022 1226 by Julio Sicks, RN Outcome: Adequate for Discharge   Problem: Skin Integrity: Goal: Risk  for impaired skin integrity will decrease 10/19/2022 1226 by Julio Sicks, RN Outcome: Adequate for Discharge 10/19/2022 1226 by Julio Sicks, RN Outcome: Adequate for Discharge

## 2022-10-19 NOTE — Progress Notes (Signed)
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 Pt had episodes nausea/vomiting, poor p.o. intake & loose bowels as well as general malaise.  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 While in the semi-fowler's position, she used a light resistance exercise band to perform 10 reps and 1 set of BUE bicep curls, lateral exchanges, and diagonal pulls. She was subsequently instructed BLE exercises including implementing ankle pumps, knee flexion and extension, and hip abduction/adduction; she required active assist for her RLE, given chronic ROM limitations. She presented with good effort and participation. Continue OT plan of care.     Recommendations for follow up therapy are one component of a multi-disciplinary discharge planning process, led by the attending physician.  Recommendations may be updated based on patient status, additional functional criteria and insurance authorization.    Assistance Recommended at Discharge Frequent or constant Supervision/Assistance  Patient can return home with the following  Two people to help with walking and/or transfers;Direct supervision/assist for medications management;A lot of help with bathing/dressing/bathroom   Equipment Recommendations  None recommended by OT       Precautions / Restrictions Precautions Precautions: Fall Restrictions Weight Bearing Restrictions: No Other Position/Activity Restrictions: Contact precautions              ADL either performed or assessed with clinical judgement    ADL Overall ADL's : Needs assistance/impaired Eating/Feeding: Independent;Bed level   Grooming: Set up;Bed level           Upper Body Dressing : Minimal assistance Upper Body Dressing Details (indicate cue type and reason): simulated at bed level Lower Body Dressing: Total assistance;Bed level       Toileting- Clothing Manipulation and Hygiene: Total assistance;Bed level                Cognition Arousal/Alertness: Awake/alert Behavior During Therapy: WFL for tasks assessed/performed Overall Cognitive Status: Within Functional Limits for tasks assessed                 Exercises  The pt was instructed on BUE and BLE therapeutic exercises for strengthening needed to facilitate improved ADL performance. She used a light resistance exercise band to perform 10 reps and 1 set of BUE bicep curls, lateral exchanges, and diagonal pulls. She was subsequently instructed BLE exercises including implementing ankle pumps, knee flexion and extension, and hip abduction/adduction; she required active assist for her RLE, given chronic ROM limitations.                  Frequency  Min 1X/week        Progress Toward Goals  OT Goals(current goals can now be found in the care plan section)     Acute Rehab OT Goals Patient Stated Goal: to start back working with therapy OT Goal Formulation: With patient Time For Goal Achievement: 10/28/22 Potential to Achieve Goals: Fair  Plan Discharge plan remains appropriate       AM-PAC OT "6 Clicks" Daily Activity     Outcome Measure   Help from another person eating meals?: None Help from another person taking  care of personal grooming?: A Little Help from another person toileting, which includes using toliet, bedpan, or urinal?: Total Help from another person bathing (including washing, rinsing, drying)?: A Lot Help from another person to put on and taking off regular upper body clothing?: A Little Help from another person  to put on and taking off regular lower body clothing?: Total 6 Click Score: 14    End of Session    OT Visit Diagnosis: Muscle weakness (generalized) (M62.81)   Activity Tolerance Patient tolerated treatment well   Patient Left in bed;with call bell/phone within reach;with bed alarm set   Nurse Communication Mobility status        Time: 7829-5621 OT Time Calculation (min): 26 min  Charges: OT General Charges $OT Visit: 1 Visit OT Treatments $Therapeutic Exercise: 23-37 mins      Reuben Likes, OTR/L 10/19/2022, 1:01 PM

## 2022-10-19 NOTE — Plan of Care (Signed)
  Problem: Education: Goal: Knowledge of General Education information will improve Description: Including pain rating scale, medication(s)/side effects and non-pharmacologic comfort measures Outcome: Adequate for Discharge   Problem: Health Behavior/Discharge Planning: Goal: Ability to manage health-related needs will improve Outcome: Progressing   Problem: Clinical Measurements: Goal: Ability to maintain clinical measurements within normal limits will improve Outcome: Progressing Goal: Will remain free from infection Outcome: Progressing Goal: Diagnostic test results will improve Outcome: Progressing Goal: Respiratory complications will improve Outcome: Progressing Goal: Cardiovascular complication will be avoided Outcome: Progressing   Problem: Activity: Goal: Risk for activity intolerance will decrease Outcome: Progressing   Problem: Nutrition: Goal: Adequate nutrition will be maintained Outcome: Progressing   Problem: Coping: Goal: Level of anxiety will decrease Outcome: Progressing   Problem: Elimination: Goal: Will not experience complications related to bowel motility Outcome: Completed/Met Goal: Will not experience complications related to urinary retention Outcome: Completed/Met   Problem: Pain Managment: Goal: General experience of comfort will improve Outcome: Progressing   Problem: Safety: Goal: Ability to remain free from injury will improve Outcome: Progressing   Problem: Skin Integrity: Goal: Risk for impaired skin integrity will decrease Outcome: Progressing

## 2022-10-19 NOTE — Discharge Instructions (Signed)

## 2022-10-19 NOTE — TOC Transition Note (Signed)
Transition of Care [facility]) - CM/SW Discharge Note  Patient Details  Name: Madison [name] MRN: [id number] Date of Birth: 1940/04/29  [facility] CM/SW Contact:  Ewing Schlein, LCSW Phone Number: 10/19/2022, 12:56 PM  Clinical Narrative: Patient is medically ready to return to LTC at [facility]. CSW updated Florentina Addison in admissions regarding discharge. Patient will return to room [facility]) and the number for report is [phone] ext. [phone]. Discharge summary, discharge orders, and SNF transfer report faxed to facility in hub. Medical necessity form done; PTAR scheduled. Discharge packet completed. Patient notified of discharge and transportation being set up. RN updated. TOC signing off.    Final next level of care: Long Term Nursing Home Barriers to Discharge: Barriers Resolved  Patient Goals and CMS Choice CMS Medicare.gov Compare Post Acute Care list provided to:: Patient Choice offered to / list presented to : Patient  Discharge Placement        Patient chooses bed at: [facility] Patient to be transferred to facility by: PTAR Patient and family notified of of transfer: 10/19/22  Discharge Plan and Services Additional resources added to the After Visit Summary for   Post Acute Care Choice: Nursing Home          DME Arranged: N/A DME Agency: NA  Social Determinants of Health (SDOH) Interventions SDOH Screenings   Food Insecurity: No Food Insecurity (10/13/2022)  Housing: Low Risk  (10/13/2022)  Transportation Needs: No Transportation Needs (10/13/2022)  Utilities: Not At Risk (10/13/2022)  Depression (PHQ2-9): Low Risk  (07/26/2022)  Tobacco Use: Medium Risk (10/15/2022)   Readmission Risk Interventions    10/17/2022   12:09 PM  Readmission Risk Prevention Plan  Transportation Screening Complete  Home Care Screening Complete  Medication Review (RN CM) Complete

## 2022-10-20 ENCOUNTER — Non-Acute Institutional Stay (SKILLED_NURSING_FACILITY): Payer: Medicare Other | Admitting: Nurse Practitioner

## 2022-10-20 DIAGNOSIS — Z6841 Body Mass Index (BMI) 40.0 and over, adult: Secondary | ICD-10-CM

## 2022-10-20 DIAGNOSIS — E66813 Obesity, class 3: Secondary | ICD-10-CM

## 2022-10-20 DIAGNOSIS — I10 Essential (primary) hypertension: Secondary | ICD-10-CM

## 2022-10-20 DIAGNOSIS — E039 Hypothyroidism, unspecified: Secondary | ICD-10-CM

## 2022-10-20 DIAGNOSIS — K2100 Gastro-esophageal reflux disease with esophagitis, without bleeding: Secondary | ICD-10-CM

## 2022-10-20 DIAGNOSIS — N1831 Chronic kidney disease, stage 3a: Secondary | ICD-10-CM

## 2022-10-20 DIAGNOSIS — E871 Hypo-osmolality and hyponatremia: Secondary | ICD-10-CM

## 2022-10-20 DIAGNOSIS — I5032 Chronic diastolic (congestive) heart failure: Secondary | ICD-10-CM

## 2022-10-20 DIAGNOSIS — M069 Rheumatoid arthritis, unspecified: Secondary | ICD-10-CM

## 2022-10-20 DIAGNOSIS — T8451XS Infection and inflammatory reaction due to internal right hip prosthesis, sequela: Secondary | ICD-10-CM

## 2022-10-20 NOTE — Progress Notes (Signed)
Location:   SNF [facility] Room Number: NO/56/A Place of Service:  SNF (31) Provider: [facility] Madison Maietta NP  Heffner, Jimmye Norman, MD  Patient Care Team: Madison Foster Jimmye Norman, MD as PCP - General (Family Medicine) Madison Masson, MD as PCP - Cardiology (Cardiology) Madison Reichert, MD as PCP - Sleep Medicine (Cardiology) Madison Ko., MD (Sports Medicine) Madison Piccolo, MD (Ophthalmology) [name], Madison Fear, MD (Dermatology) Madison Redbird, MD (Gastroenterology) Madison Abu, MD (Nephrology) Madison [name], [place] as Referring Physician (Chiropractic Medicine)  Extended Emergency Contact Information Primary Emergency Contact: Madison [name] Address: [address]          [place], [place] [address] Madison [name] of [place] Home Phone: [phone] Mobile Phone: [phone] Relation: Spouse  Code Status: DNR Goals of care: Advanced Directive information    10/21/2022    9:29 AM  Advanced Directives  Does Patient Have a Medical Advance Directive? Yes  Type of Advance Directive Out of facility DNR (pink MOST or yellow form)  Does patient want to make changes to medical advance directive? No - Patient declined  Copy of Healthcare Power of Attorney in Chart? No - copy requested  Pre-existing out of facility DNR order (yellow form or pink MOST form) Pink MOST form placed in chart (order not valid for inpatient use)     Chief Complaint  Patient presents with  . Acute Visit    Patient is here for follow up and medication review after [name] stay     HPI:  Pt is a 82 y.o. female seen today for an acute visit for medication review following [name] stay, has noted increased dependent edema, denied cough, SOB, off O2, clear lungs.    Hospitalized AKI 10/13/22-10/19/22 for AKI, repeat BMP 2x/wk until creat back to baseline, may resume Furosemide   Hospitalized 07/08/22 for CHF, abscess of the right hip which previous infected, treated, and  prosthesis was removed and never replaced. Open drainage site presents.               Hospitalized 07/02/22-07/07/22 for the right hip cellulitis, CT hip in [name] fluid collection unclear if it communicates with the joint space, Ortho not felt to be related to the joint space and may be an old fluid collection, aspiration not recommended.              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 F/u Ortho. TDWB. Power wc. 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 Fully treated, then off antibiotics.               Elevated AST/ALT normalized, S/p cholecystectomy, [place] 02/19/21 no cyst or mass.              Anemia, post op, s/p EGD no active bleed. S/p 6 u PRBC transfusion, ASA was dc'd, On Fe, Iron 15 in [name], Hgb 11.5 10/19/22             Muscle spasm, controlled, takes Gabapentin, prn Methocarbamol.              HTN  resume Furosemide, off Spironolactone.              Morbid obesity             OSA CPAP             Chronic diarrhea, prn Imodium, Align  RA f/u Rheumatology, hx of Plaquenil use, stable presently.              Insomnia/depression/anxiety, stable, off Mirtazapine.              GERD, better on  Pantoprazole, off  Omeprazole, prn Zofran. Hgb 11.5 10/19/22             CHF/Edema, dependent  areas, resumed Furosemide, off Spironolactone CKD  Bun/creat 42/2.04 10/19/22, f/u Nephrology Hyponatremia, Na 134 10/19/22             Hypothyroidism, takes Levothyroxine, TSH 3.48 06/28/22      Past Medical History:  Diagnosis Date  . Benign hypertension with chronic kidney disease, stage III (HCC)    Overview:  Last Assessment & Plan:  Usually the patient is hypertensive. However today her blood pressure is 105 systolic. She has been feeling fatigued with some lightheadedness. Part of this may be related to her lower blood pressure. Her pressure may be improved with her decrease in salt and fluid intake. I've instructed her  to put her lisinopril/hctz on hold. I've asked her to see her primary   . Cardiac disease 03/10/2014  . Chronic diarrhea 07/27/2015  . Edema 07/27/2015  . Ejection fraction 2004   Normal, echo,   . Gastroesophageal reflux disease   . GERD (gastroesophageal reflux disease)   . Heart disease 03/10/2014  . Hypertension   . Left bundle branch block 09/01/2020  . Obesity   . Obesity (BMI 30-39.9) 09/25/2017  . OSA (obstructive sleep apnea)    mild with AHI 6.75 - on CPAP  . Preop cardiovascular exam 11/2010   Cardiac clearance for knee surgery   . Sleep apnea 03/10/2014  . Supraventricular tachycardia    Documented episode in the past, possibly reentrant tachycardia  Overview:  Overview:  Documented episode in the past, 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 It was possibly reentrant. She does well with diltiazem. No change in therapy.  . 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  . Thyroid disease   . Urinary incontinence    Past Surgical History:  Procedure Laterality Date  . CATARACT EXTRACTION Left 8/11  . CATARACT EXTRACTION Right 3/12  . CHOLECYSTECTOMY  1994  . Copsilotomy Laser Treatment Left 6/12   eye  . ELBOW SURGERY  8/12  . 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 EYE SURGERY Right 12/11  . Lumbar Infusion  2011  . MOLE REMOVAL  06/17/2015  . 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 TOTAL KNEE ARTHROPLASTY Right 06/13/2011    Allergies  Allergen Reactions  . Keflex [Cephalexin] Diarrhea  . Macrobid [Nitrofurantoin] Hives  . Amoxicillin Rash    Allergies as of 10/20/2022       Reactions   Keflex [cephalexin] Diarrhea   Macrobid [nitrofurantoin] Hives   Amoxicillin Rash        Medication List        Accurate as of October 20, 2022 11:59 PM. If you have any questions, ask your nurse or doctor.           albuterol 108 (90 Base) MCG/ACT inhaler Commonly known as: VENTOLIN HFA Inhale 2 puffs into the lungs every 6 (six) hours as needed for wheezing or shortness of breath.   bifidobacterium infantis capsule Take 1 capsule by mouth daily.   CALCIUM  PLUS VITAMIN D3 PO Take 1 tablet by mouth daily. Vitamin D3 + Calcium 600   ferrous sulfate 325 (65 FE) MG EC tablet Take 325 mg by mouth as directed. Once A Day on Mon, Thu   gabapentin 300 MG capsule Commonly known as: NEURONTIN Take 300 mg by mouth at bedtime.   ipratropium-albuterol 0.5-2.5 (3) MG/3ML Soln Commonly known as: DUONEB Inhale 3 mLs into the lungs every 8 (eight) hours as needed (wheezing).   levothyroxine 25 MCG tablet Commonly known as: SYNTHROID Give 1 tablet orally one time a day every 2 day(s); 2 tablets (50 mcg) one time a day every 2 day(s)   loperamide 2 MG capsule Commonly known as: IMODIUM Take 2 mg by mouth every 4 (four) hours as needed for diarrhea or loose stools.   nystatin powder Commonly known as: MYCOSTATIN/NYSTOP Apply 1 application  topically in the morning, at noon, in the evening, and at bedtime. To genital areas and inner thigh   nystatin cream Commonly known as: MYCOSTATIN Apply 1 Application topically at bedtime.   Protonix 40 MG tablet Generic drug: pantoprazole Take 40 mg by mouth daily.   triamcinolone cream 0.5 % Commonly known as: KENALOG Apply 1 Application topically at bedtime.   Vitamin D3 50 MCG (2000 UT) Tabs Take 2 tablets by mouth daily.        Review of Systems  Constitutional:  Negative for appetite change, fatigue and fever.  HENT:  Negative for congestion, sore throat and trouble swallowing.   Eyes:  Negative for visual disturbance.  Respiratory:  Negative for cough, shortness of breath and wheezing.   Cardiovascular:  Positive for leg swelling.  Gastrointestinal:  Negative for abdominal pain, constipation and nausea.  Genitourinary:  Negative  for dysuria, frequency and urgency.       Urogenital itching upon urination w/o skin irritation or rash.  Musculoskeletal:  Positive for arthralgias and gait problem.       Lower back muscle spasm is better controlled  Skin:  Positive for wound. Negative for color change and rash.       currently drainage opening present.   Neurological:  Negative for speech difficulty and weakness.  Psychiatric/Behavioral:  Negative for confusion and sleep disturbance. The patient is not nervous/anxious.     Immunization History  Administered Date(s) Administered  . Influenza Split 01/28/2008, 02/17/2009, 02/20/2012, 02/11/2013, 02/02/2016, 01/11/2017, 02/04/2019  . Influenza, High Dose Seasonal PF 02/04/2015, 01/11/2017, 01/30/2018, 02/05/2020  . Influenza-Unspecified 02/16/2022  . Moderna SARS-COV2 Booster Vaccination 02/24/2022  . Moderna Sars-Covid-2 Vaccination 05/09/2019, 05/27/2019, 03/03/2020, 09/04/2020, 11/04/2020  . [employer], [employer] 82yrs & up 02/09/2021  . Pneumococcal Conjugate-13 08/30/2013  . Pneumococcal Polysaccharide-23 11/03/2005  . RSV,unspecified 04/22/2022  . Td 08/01/2001, 09/05/2019  . Tdap 09/22/2010, 11/24/2020  . Zoster Recombinant(Shingrix) 11/08/2016, 01/11/2017  . Zoster, Live 11/04/2005, 11/08/2016, 01/11/2017  . Zoster, Unspecified 01/11/2017   Pertinent  Health Maintenance Due  Topic Date Due  . INFLUENZA VACCINE  11/24/2022  . DEXA SCAN  Completed      04/15/2022   10:15 AM 04/27/2022   11:35 AM 05/31/2022    9:34 AM 07/26/2022    9:31 AM 10/21/2022    9:31 AM  Fall Risk  Falls in the past year? 0 0 0 0 0  Was there an injury with Fall? 0 0 0 0 0  Fall Risk Category Calculator 0 0 0 0 0  Fall Risk Category (Retired) Low Low     (RETIRED) Patient  Fall Risk Level Moderate fall risk Moderate fall risk     Patient at Risk for Falls Due to History of fall(s) History of fall(s) History of fall(s)  No Fall Risks  Fall risk Follow up Falls  evaluation completed Falls evaluation completed Falls evaluation completed  Falls evaluation completed   Functional Status Survey:    Vitals:   10/20/22 0927  BP: 118/70  Pulse: 72  Resp: 18  Temp: 97.8 F (36.6 C)  SpO2: 95%  Weight: 295 lb (133.8 kg)  Height: 5\' 8"  (1.727 m)   Body mass index is 44.85 kg/m. Physical Exam Vitals and nursing note reviewed.  Constitutional:      Appearance: Normal appearance. She is obese.  HENT:     Head: Normocephalic and atraumatic.     Nose: Nose normal.     Mouth/Throat:     Mouth: Mucous membranes are moist.  Eyes:     Extraocular Movements: Extraocular movements intact.     Conjunctiva/sclera: Conjunctivae normal.     Pupils: Pupils are equal, round, and reactive to light.  Cardiovascular:     Rate and Rhythm: Normal rate and regular rhythm.     Heart sounds: No murmur heard. Pulmonary:     Effort: Pulmonary effort is normal.     Breath sounds: Wheezing present. No rales.  Abdominal:     General: Bowel sounds are normal.     Palpations: Abdomen is soft.     Tenderness: There is no abdominal tenderness.  Musculoskeletal:     Cervical back: Normal range of motion and neck supple.     Right lower leg: Edema present.     Left lower leg: Edema present.     Comments: R hip, prosthetic hip removed. Dependent edema moderate to severe  Skin:    General: Skin is warm and dry.     Comments: Right hip surgical scar, right hip drainage site.  Edema mostly in the dependent areas, buttock, thighs, legs.   Neurological:     General: No focal deficit present.     Mental Status: She is alert and oriented to person, place, and time.     Gait: Gait abnormal.  Psychiatric:        Mood and Affect: Mood normal.        Behavior: Behavior normal.        Thought Content: Thought content normal.        Judgment: Judgment normal.    Labs reviewed: Recent Labs    07/04/22 0502 07/05/22 0538 07/06/22 0702 07/08/22 1714 07/12/22 0437  07/13/22 0412 10/17/22 0327 10/18/22 0338 10/19/22 0339  NA 134* 135 136   < > 133*   < > 132* 134* 134*  K 4.2 3.7 3.4*   < > 3.0*   < > 3.9 3.9 3.7  CL 104 106 109   < > 102   < > 103 106 107  CO2 20* 19* 19*   < > 20*   < > 20* 21* 21*  GLUCOSE 96 94 106*   < > 113*   < > 99 91 88  BUN 25* 24* 20   < > 20   < > 58* 49* 42*  CREATININE 1.21* 1.14* 1.01*   < > 1.21*   < > 2.63* 2.12* 2.04*  CALCIUM 8.2* 8.4* 8.4*   < > 7.7*   < > 8.0* 8.0* 7.9*  MG 2.4 2.4 2.4  --  1.8  --   --   --   --  PHOS 3.6 4.3 3.6  --   --   --   --   --   --    < > = values in this interval not displayed.   Recent Labs    07/05/22 0538 07/06/22 0702 07/21/22 0000 08/18/22 0000 10/13/22 1855  AST 30 36 39* 22 20  ALT 21 25 48* 18 17  ALKPHOS 66 63 64 85 85  BILITOT 0.6 0.3  --   --  1.1  PROT 6.3* 6.4*  --   --  7.0  ALBUMIN 2.2* 2.3* 3.0* 2.9* 2.7*   Recent Labs    07/06/22 0702 07/08/22 1714 07/08/22 2208 07/21/22 0000 10/13/22 1855 10/15/22 0349 10/16/22 1125 10/19/22 0339  WBC 11.9* 15.6*   < > 6.4   < > 14.8* 16.9* 11.9*  NEUTROABS 9.5* 12.1*  --  4,230.00  --   --   --   --   HGB 10.8* 11.7*   < > 11.7*   < > 11.5* 10.7* 11.5*  HCT 37.0 38.5   < > 36   < > 36.6 34.9* 37.1  MCV 91.8 90.2   < >  --    < > 88.6 89.7 89.6  PLT 536* 607*   < > 221   < > 156 159 261   < > = values in this interval not displayed.   Lab Results  Component Value Date   TSH 4.51 06/30/2021   No results found for: "HGBA1C" No results found for: "CHOL", "HDL", "LDLCALC", "LDLDIRECT", "TRIG", "CHOLHDL"  Significant Diagnostic Results in last 30 days:  US RENAL  Result Date: 10/16/2022 CLINICAL DATA:  [id number] AKI (acute kidney injury) (HCC) [id number] EXAM: RENAL / URINARY TRACT ULTRASOUND COMPLETE COMPARISON:  None Available. FINDINGS: Right Kidney: Renal measurements: 12.6 x 6.2 x 6.7 cm = volume: 271 mL. Echogenicity within normal limits. No mass or hydronephrosis visualized. Left Kidney: Renal measurements:  11.2 x 5.0 x 6.7 cm = volume: 194 mL. Echogenicity within normal limits. No mass or hydronephrosis visualized. Bladder: Appears normal for degree of bladder distention. Other: None. IMPRESSION: No evidence of obstructive uropathy. Electronically Signed   By: Duanne Guess D.O.   On: 10/16/2022 16:26   DG Chest Portable 1 View  Result Date: 10/13/2022 CLINICAL DATA:  Weakness.  Abnormal labs. EXAM: PORTABLE CHEST 1 VIEW COMPARISON:  Radiograph 07/11/2022, CT 07/02/2022 FINDINGS: Chronic cardiomegaly. Unchanged mediastinal contours. Peribronchial thickening appears chronic. No evidence of focal airspace disease. No pneumothorax or large pleural effusion. On limited assessment, no acute osseous findings. Chronic right shoulder arthropathy. IMPRESSION: Chronic cardiomegaly. Chronic peribronchial thickening. No acute findings. Electronically Signed   By: Narda Rutherford M.D.   On: 10/13/2022 18:55    Assessment/Plan: Chronic diastolic CHF (congestive heart failure) (HCC) Retaining fluid, no O2 desaturation or respiratory symptoms, will try low dose of Furosemide 20mg  every day, f/u BMP one week.  has noted increased dependent edema, denied cough, SOB, off O2, clear lungs.   Stage 3a chronic kidney disease (CKD) (HCC) Hospitalized AKI 10/13/22-10/19/22 for AKI, repeat BMP 2x/wk until creat back to baseline, may resume Furosemide, f/u nephrology.   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 F/u Ortho. TDWB. Power wc. 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 Fully treated, then off antibiotics. f/u ID  Acquired hypothyroidism  takes Levothyroxine, TSH 3.48 06/28/22  Hyponatremia  Na  134 10/19/22  GERD (gastroesophageal reflux disease)  better on  Pantoprazole, off  Omeprazole, prn Zofran. Hgb 11.5 10/19/22  Rheumatoid arthritis (HCC) hx of Plaquenil  use, stable presently.   Essential hypertension  resume Furosemide, off Spironolactone.   Adiposity  Morbid obesity, not sure if weight reduction is possible given comorbidity     Family/ staff Communication: plan of care reviewed with the patient and charge nurse.   Labs/tests ordered:  BMP one week  Time spend 35 minutes.

## 2022-10-21 ENCOUNTER — Encounter: Payer: Self-pay | Admitting: Nurse Practitioner

## 2022-10-21 NOTE — Assessment & Plan Note (Signed)
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 F/u Ortho. TDWB. Power wc. 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 Fully treated, then off antibiotics. f/u ID

## 2022-10-21 NOTE — Assessment & Plan Note (Signed)
better on  Pantoprazole, off  Omeprazole, prn Zofran. Hgb 11.5 10/19/22

## 2022-10-21 NOTE — Assessment & Plan Note (Addendum)
Retaining fluid, no O2 desaturation or respiratory symptoms, will try low dose of Furosemide 20mg  every day, f/u BMP one week.  has noted increased dependent edema, denied cough, SOB, off O2, clear lungs.

## 2022-10-21 NOTE — Assessment & Plan Note (Signed)
Morbid obesity, not sure if weight reduction is possible given comorbidity

## 2022-10-21 NOTE — Assessment & Plan Note (Signed)
hx of Plaquenil use, stable presently.  

## 2022-10-21 NOTE — Assessment & Plan Note (Signed)
resume Furosemide, off Spironolactone.

## 2022-10-21 NOTE — Assessment & Plan Note (Signed)
takes Levothyroxine, TSH 3.48 06/28/22 

## 2022-10-21 NOTE — Assessment & Plan Note (Signed)
Na 134 10/19/22

## 2022-10-21 NOTE — Assessment & Plan Note (Addendum)
Hospitalized AKI 10/13/22-10/19/22 for AKI, repeat BMP 2x/wk until creat back to baseline, may resume Furosemide, f/u nephrology.   Bun/creat 42/2.04 10/19/22, f/u Nephrology 10/21/22 Bun/creat 32/1.8

## 2022-10-24 ENCOUNTER — Encounter: Payer: Self-pay | Admitting: Nurse Practitioner

## 2022-10-25 ENCOUNTER — Non-Acute Institutional Stay (SKILLED_NURSING_FACILITY): Payer: Medicare Other | Admitting: Family Medicine

## 2022-10-25 DIAGNOSIS — N179 Acute kidney failure, unspecified: Secondary | ICD-10-CM | POA: Diagnosis not present

## 2022-10-25 DIAGNOSIS — Z89621 Acquired absence of right hip joint: Secondary | ICD-10-CM

## 2022-10-25 DIAGNOSIS — I5032 Chronic diastolic (congestive) heart failure: Secondary | ICD-10-CM | POA: Diagnosis not present

## 2022-10-25 DIAGNOSIS — R269 Unspecified abnormalities of gait and mobility: Secondary | ICD-10-CM

## 2022-10-25 LAB — COMPREHENSIVE METABOLIC PANEL: Calcium: 7.9 — AB (ref 8.7–10.7)

## 2022-10-25 LAB — BASIC METABOLIC PANEL
BUN: 22 — AB (ref 4–21)
CO2: 21 (ref 13–22)
Chloride: 110 — AB (ref 99–108)
Creatinine: 1.7 — AB (ref 0.5–1.1)
Glucose: 72
Potassium: 4.1 mEq/L (ref 3.5–5.1)
Sodium: 139 (ref 137–147)

## 2022-10-25 NOTE — Progress Notes (Signed)
Provider:  Jacalyn Lefevre, MD Location:      Place of Service:     PCP: Magdalene Patricia, MD Patient Care Team: Eulis Foster Jimmye Norman, MD as PCP - General (Family Medicine) [name], MD as PCP - Cardiology (Cardiology) [name], MD as PCP - Sleep Medicine (Cardiology) [name], MD (Sports Medicine) [name], MD (Ophthalmology) [name], [name], MD (Dermatology) [name], MD (Gastroenterology) [name], MD (Nephrology) [name], DC as Referring Physician (Chiropractic Medicine)  Extended Emergency Contact Information Primary Emergency Contact: [facility] Address: [address]          [place], [place] [address] [name] of [place] Home Phone: [phone] Mobile Phone: [phone] Relation: Spouse  Code Status:  Goals of Care: Advanced Directive information    10/21/2022    9:29 AM  Advanced Directives  Does Patient Have a Medical Advance Directive? Yes  Type of Advance Directive Out of facility DNR (pink MOST or yellow form)  Does patient want to make changes to medical advance directive? No - Patient declined  Copy of Healthcare Power of Attorney in Chart? No - copy requested  Pre-existing out of facility DNR order (yellow form or pink MOST form) Pink MOST form placed in chart (order not valid for inpatient use)      No chief complaint on file.   HPI: Patient is a 82 y.o. female seen today for admission to [facility].  She was just discharged from the hospital after a 6-day stay for acute kidney injury superimposed on chronic kidney disease.  He was treated with IV fluids with some improvement in renal function.  She initially presented with nausea vomiting and poor oral intake.  Found to have acute kidney injury.  Diuretics were held, but on the day of discharge 2-3+ pitting edema was noted.  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 However today her blood pressure is 105 systolic. She has been feeling fatigued with some lightheadedness. Part of this may be related to her lower blood pressure. Her pressure may be improved with her decrease in salt and fluid intake. I've instructed her to put her lisinopril/hctz on hold. 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 It was possibly reentrant. She does well with diltiazem. No  change in therapy.   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 She has never been exposed to tobacco smoke. She has never used smokeless tobacco. She reports that she does not drink alcohol and does not use drugs. Social History   Socioeconomic History   Marital status: Married    Spouse name: Not on file   Number of children: Not on file   Years of education: Not on file   Highest education level: Not on file  Occupational History   Not on file  Tobacco Use   Smoking status: Former    Passive exposure: Never   Smokeless tobacco: Never  Vaping Use   Vaping Use: Never used  Substance and Sexual Activity   Alcohol use: No   Drug use: No   Sexual activity: Not Currently  Other Topics Concern   Not on file  Social History Narrative   Not on file   Social Determinants of Health   Financial Resource Strain: Not on file  Food Insecurity: No Food Insecurity (10/13/2022)   Hunger Vital Sign    Worried About Running Out of Food in the Last Year: Never true    Ran Out of Food in the Last Year: Never true  Transportation Needs: No Transportation Needs (10/13/2022)   PRAPARE - Administrator, Civil Service (Medical): No    Lack of Transportation (Non-Medical): No  Physical Activity: Not on file  Stress: Not on file  Social Connections: Not on file  Intimate Partner Violence: Not At Risk (10/13/2022)   Humiliation, Afraid, Rape, and Kick questionnaire    Fear of Current or Ex-Partner: No    Emotionally Abused: No    Physically Abused: No    Sexually Abused: No    Functional Status Survey:    Family History  Problem Relation Age of Onset   High blood pressure Mother    Diabetes Mother    Heart Problems Father    Diabetes Father    Prostate cancer Father    Heart attack Father    Heart attack Paternal Grandmother    Heart attack Maternal Grandfather     Heart attack Paternal Uncle    Stroke Neg Hx     Health Maintenance  Topic Date Due   Medicare Annual Wellness (AWV)  11/12/2022   COVID-19 Vaccine (6 - 2023-24 season) 12/03/2022 (Originally 04/21/2022)   INFLUENZA VACCINE  11/24/2022   DTaP/Tdap/Td (5 - Td or Tdap) 11/25/2030   Pneumonia Vaccine 74+ Years old  Completed   DEXA SCAN  Completed   Zoster Vaccines- Shingrix  Completed   HPV VACCINES  Aged Out    Allergies  Allergen Reactions   Keflex [Cephalexin] Diarrhea   Macrobid [Nitrofurantoin] Hives   Amoxicillin Rash    Outpatient Encounter Medications as of 10/25/2022  Medication Sig   albuterol (VENTOLIN HFA)  108 (90 Base) MCG/ACT inhaler Inhale 2 puffs into the lungs every 6 (six) hours as needed for wheezing or shortness of breath.   bifidobacterium infantis (ALIGN) capsule Take 1 capsule by mouth daily.   Calcium Carb-Cholecalciferol (CALCIUM PLUS VITAMIN D3 PO) Take 1 tablet by mouth daily. Vitamin D3 + Calcium 600   Cholecalciferol (VITAMIN D3) 50 MCG (2000 UT) TABS Take 2 tablets by mouth daily.   ferrous sulfate 325 (65 FE) MG EC tablet Take 325 mg by mouth as directed. Once A Day on Mon, Thu   gabapentin (NEURONTIN) 300 MG capsule Take 300 mg by mouth at bedtime.   ipratropium-albuterol (DUONEB) 0.5-2.5 (3) MG/3ML SOLN Inhale 3 mLs into the lungs every 8 (eight) hours as needed (wheezing).   levothyroxine (SYNTHROID) 25 MCG tablet Give 1 tablet orally one time a day every 2 day(s); 2 tablets (50 mcg) one time a day every 2 day(s)   loperamide (IMODIUM) 2 MG capsule Take 2 mg by mouth every 4 (four) hours as needed for diarrhea or loose stools.   nystatin (MYCOSTATIN/NYSTOP) powder Apply 1 application  topically in the morning, at noon, in the evening, and at bedtime. To genital areas and inner thigh   nystatin cream (MYCOSTATIN) Apply 1 Application topically at bedtime.   pantoprazole (PROTONIX) 40 MG tablet Take 40 mg by mouth daily.   triamcinolone cream  (KENALOG) 0.5 % Apply 1 Application topically at bedtime.   No facility-administered encounter medications on file as of 10/25/2022.    Review of Systems  Constitutional:  Positive for fatigue.  Respiratory: Negative.    Cardiovascular:  Positive for leg swelling.  Gastrointestinal: Negative.   Musculoskeletal:  Positive for gait problem.  Hematological: Negative.   Psychiatric/Behavioral: Negative.    All other systems reviewed and are negative.   There were no vitals filed for this visit. There is no height or weight on file to calculate BMI. Physical Exam Vitals and nursing note reviewed.  Constitutional:      Appearance: She is obese.  HENT:     Head: Normocephalic.  Cardiovascular:     Rate and Rhythm: Normal rate and regular rhythm.  Pulmonary:     Effort: Pulmonary effort is normal.     Breath sounds: Normal breath sounds.  Musculoskeletal:     Right lower leg: Edema present.     Left lower leg: Edema present.  Skin:    General: Skin is warm and dry.     Comments: Chronic wound right hip  Neurological:     General: No focal deficit present.     Mental Status: She is alert and oriented to person, place, and time.     Labs reviewed: Basic Metabolic Panel: Recent Labs    07/04/22 0502 07/05/22 3664 07/06/22 4034 07/08/22 1714 07/12/22 0437 07/13/22 0412 10/17/22 0327 10/18/22 0338 10/19/22 0339  NA 134* 135 136   < > 133*   < > 132* 134* 134*  K 4.2 3.7 3.4*   < > 3.0*   < > 3.9 3.9 3.7  CL 104 106 109   < > 102   < > 103 106 107  CO2 20* 19* 19*   < > 20*   < > 20* 21* 21*  GLUCOSE 96 94 106*   < > 113*   < > 99 91 88  BUN 25* 24* 20   < > 20   < > 58* 49* 42*  CREATININE 1.21* 1.14* 1.01*   < >  1.21*   < > 2.63* 2.12* 2.04*  CALCIUM 8.2* 8.4* 8.4*   < > 7.7*   < > 8.0* 8.0* 7.9*  MG 2.4 2.4 2.4  --  1.8  --   --   --   --   PHOS 3.6 4.3 3.6  --   --   --   --   --   --    < > = values in this interval not displayed.   Liver Function Tests: Recent  Labs    07/05/22 0538 07/06/22 0702 07/21/22 0000 08/18/22 0000 10/13/22 1855  AST 30 36 39* 22 20  ALT 21 25 48* 18 17  ALKPHOS 66 63 64 85 85  BILITOT 0.6 0.3  --   --  1.1  PROT 6.3* 6.4*  --   --  7.0  ALBUMIN 2.2* 2.3* 3.0* 2.9* 2.7*   No results for input(s): "LIPASE", "AMYLASE" in the last 8760 hours. No results for input(s): "AMMONIA" in the last 8760 hours. CBC: Recent Labs    07/06/22 0702 07/08/22 1714 07/08/22 2208 07/21/22 0000 10/13/22 1855 10/15/22 0349 10/16/22 1125 10/19/22 0339  WBC 11.9* 15.6*   < > 6.4   < > 14.8* 16.9* 11.9*  NEUTROABS 9.5* 12.1*  --  4,230.00  --   --   --   --   HGB 10.8* 11.7*   < > 11.7*   < > 11.5* 10.7* 11.5*  HCT 37.0 38.5   < > 36   < > 36.6 34.9* 37.1  MCV 91.8 90.2   < >  --    < > 88.6 89.7 89.6  PLT 536* 607*   < > 221   < > 156 159 261   < > = values in this interval not displayed.   Cardiac Enzymes: Recent Labs    07/06/22 0702  CKTOTAL 12*   BNP: Invalid input(s): "POCBNP" No results found for: "HGBA1C" Lab Results  Component Value Date   TSH 4.51 06/30/2021   Lab Results  Component Value Date   VITAMINB12 738 07/04/2022   Lab Results  Component Value Date   FOLATE 11.3 07/04/2022   Lab Results  Component Value Date   IRON 18 (L) 07/09/2022   TIBC 155 (L) 07/09/2022   FERRITIN 772 (H) 07/09/2022    Imaging and Procedures obtained prior to SNF admission: DG Chest Portable 1 View  Result Date: 10/13/2022 CLINICAL DATA:  Weakness.  Abnormal labs. EXAM: PORTABLE CHEST 1 VIEW COMPARISON:  Radiograph 07/11/2022, CT 07/02/2022 FINDINGS: Chronic cardiomegaly. Unchanged mediastinal contours. Peribronchial thickening appears chronic. No evidence of focal airspace disease. No pneumothorax or large pleural effusion. On limited assessment, no acute osseous findings. Chronic right shoulder arthropathy. IMPRESSION: Chronic cardiomegaly. Chronic peribronchial thickening. No acute findings. Electronically Signed    By: Narda Rutherford M.D.   On: 10/13/2022 18:55    Assessment/Plan 1. Absence of hip joint, right Continues to have some drainage from open wound.  Followed by infectious disease  2. Acute kidney injury [facility]) Kidney function has improved somewhat since hospitalization with.  Will continue to follow  3. Chronic diastolic CHF (congestive heart failure) (HCC) Patient back on Lasix still holding spironolactone.  She does have some edema today  4. Class 3 obesity (HCC) Patient has had issues with weight gain some of this is fluid but some of this caloric using weight loss drugs problematic given her fragile renal function  5. 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  Gets around via motorized wheelchair    Family/ staff Communication:   Labs/tests ordered:  Bertram Millard. Hyacinth Meeker, MD [facility] [address] [place], [place] [address] Office [phone]

## 2022-11-01 LAB — COMPREHENSIVE METABOLIC PANEL: Calcium: 8 — AB (ref 8.7–10.7)

## 2022-11-01 LAB — BASIC METABOLIC PANEL
BUN: 18 (ref 4–21)
CO2: 23 — AB (ref 13–22)
Chloride: 107 (ref 99–108)
Creatinine: 1.9 — AB (ref 0.5–1.1)
Glucose: 84
Potassium: 3.9 mEq/L (ref 3.5–5.1)
Sodium: 138 (ref 137–147)

## 2022-11-03 LAB — BASIC METABOLIC PANEL
BUN: 18 (ref 4–21)
CO2: 23 — AB (ref 13–22)
Chloride: 108 (ref 99–108)
Creatinine: 1.8 — AB (ref 0.5–1.1)
Glucose: 77
Potassium: 4.2 mEq/L (ref 3.5–5.1)
Sodium: 138 (ref 137–147)

## 2022-11-03 LAB — COMPREHENSIVE METABOLIC PANEL: Calcium: 8.2 — AB (ref 8.7–10.7)

## 2022-11-08 LAB — BASIC METABOLIC PANEL
BUN: 21 (ref 4–21)
CO2: 23 — AB (ref 13–22)
Chloride: 108 (ref 99–108)
Creatinine: 1.9 — AB (ref 0.5–1.1)
Glucose: 77
Potassium: 4.4 mEq/L (ref 3.5–5.1)
Sodium: 138 (ref 137–147)

## 2022-11-08 LAB — COMPREHENSIVE METABOLIC PANEL
Calcium: 8.6 — AB (ref 8.7–10.7)
eGFR: 27

## 2022-11-09 ENCOUNTER — Non-Acute Institutional Stay (SKILLED_NURSING_FACILITY): Payer: Medicare Other | Admitting: Nurse Practitioner

## 2022-11-09 ENCOUNTER — Encounter: Payer: Self-pay | Admitting: Nurse Practitioner

## 2022-11-09 DIAGNOSIS — R635 Abnormal weight gain: Secondary | ICD-10-CM | POA: Diagnosis not present

## 2022-11-09 DIAGNOSIS — I5032 Chronic diastolic (congestive) heart failure: Secondary | ICD-10-CM

## 2022-11-09 DIAGNOSIS — E039 Hypothyroidism, unspecified: Secondary | ICD-10-CM

## 2022-11-09 DIAGNOSIS — D72829 Elevated white blood cell count, unspecified: Secondary | ICD-10-CM

## 2022-11-09 DIAGNOSIS — K2100 Gastro-esophageal reflux disease with esophagitis, without bleeding: Secondary | ICD-10-CM

## 2022-11-09 DIAGNOSIS — I10 Essential (primary) hypertension: Secondary | ICD-10-CM

## 2022-11-09 DIAGNOSIS — N184 Chronic kidney disease, stage 4 (severe): Secondary | ICD-10-CM | POA: Diagnosis not present

## 2022-11-09 DIAGNOSIS — M62838 Other muscle spasm: Secondary | ICD-10-CM

## 2022-11-10 ENCOUNTER — Encounter: Payer: Self-pay | Admitting: Nurse Practitioner

## 2022-11-10 NOTE — Assessment & Plan Note (Signed)
wbc was trended down from 16.9 10/16/22 >>11.9 10/19/22 11/20/22 f/u CBC/diff

## 2022-11-10 NOTE — Assessment & Plan Note (Signed)
Blood pressure is controlled, resume Furosemide, off Spironolactone.

## 2022-11-10 NOTE — Progress Notes (Addendum)
Location:   SNF [facility] Room Number: 72 Place of Service:  SNF (31) Provider: Arna Snipe Nyasha Rahilly NP  Heffner, Jimmye Norman, MD  Patient Care Team: Heffner, Jimmye Norman, MD as PCP - General (Family Medicine) [name], MD as PCP - Cardiology (Cardiology) [name], MD as PCP - Sleep Medicine (Cardiology) [name], MD (Sports Medicine) [name], MD (Ophthalmology) [name], [name], MD (Dermatology) [name], MD (Gastroenterology) [name], MD (Nephrology) [name], DC as Referring Physician (Chiropractic Medicine)  Extended Emergency Contact Information Primary Emergency Contact: [facility] Address: [address]          [place], [place] [address] [name] of [place] Home Phone: [phone] Mobile Phone: [phone] Relation: Spouse  Code Status: DNR Goals of care: Advanced Directive information    10/21/2022    9:29 AM  Advanced Directives  Does Patient Have a Medical Advance Directive? Yes  Type of Advance Directive Out of facility DNR (pink MOST or yellow form)  Does patient want to make changes to medical advance directive? No - Patient declined  Copy of Healthcare Power of Attorney in Chart? No - copy requested  Pre-existing out of facility DNR order (yellow form or pink MOST form) Pink MOST form placed in chart (order not valid for inpatient use)     Chief Complaint  Patient presents with   Acute Visit    Weight gain    HPI:  Pt is a 82 y.o. female seen today for an acute visit for persisted weigh gains: #8Ibs in the past 3 weeks, her baseline weight 6-7 months ago was 280s, then significant weight gained with onset of acute CHF, placed on diuretics, then weight fluctuating #20Ibs. Care plan meeting: requested appetite suppressant and weight loss medication, spoke with the patient benefit vs risk of medication of weight loss, the patient declined it and desires to watch her diet  for now. Also her wbc was trended down from 16.9 10/16/22 >>11.9 10/19/22   Hospitalized AKI 10/13/22-10/19/22 for AKI, repeat BMP 2x/wk until creat back to baseline, may resume Furosemide              Hospitalized 07/08/22 for CHF, abscess of the right hip which previous infected, treated, and prosthesis was removed and never replaced. Open drainage site presents.               Hospitalized 07/02/22-07/07/22 for the right hip cellulitis, CT hip in hospital fluid collection unclear if it communicates with the joint space, Ortho not felt to be related to the joint space and may be an old fluid collection, aspiration not recommended.              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 F/u Ortho. TDWB. Power wc. 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 Fully treated, then off antibiotics.               Elevated AST/ALT normalized, S/p cholecystectomy, [place] 02/19/21 no cyst or mass.              Anemia, post op, s/p EGD no active bleed. S/p 6 u PRBC transfusion, ASA was dc'd, On Fe, Iron 15 in hospital, Hgb 11.5 10/19/22             Muscle spasm, controlled, dc Gabapentin I setting of weight gain, prn Methocarbamol.  HTN  resume Furosemide, off Spironolactone.              Morbid obesity             OSA CPAP             Chronic diarrhea, prn Imodium, Align             RA f/u Rheumatology, hx of Plaquenil use, stable presently.              Insomnia/depression/anxiety, stable, off Mirtazapine.              GERD, better on  Pantoprazole, off  Omeprazole, prn Zofran. Hgb 11.5 10/19/22             CHF/Edema, dependent  areas, resumed Furosemide, off Spironolactone CKD  Bun/creat 42/2.04 10/19/22, 21/1.86 11/09/22, f/u Nephrology Hyponatremia, Na 134 10/19/22             Hypothyroidism, takes Levothyroxine, TSH 3.48 06/28/22   Past Medical History:  Diagnosis Date   Benign hypertension with chronic kidney disease, stage III (HCC)     Overview:  Last Assessment & Plan:  Usually the patient is hypertensive. However today her blood pressure is 105 systolic. She has been feeling fatigued with some lightheadedness. Part of this may be related to her lower blood pressure. Her pressure may be improved with her decrease in salt and fluid intake. I've instructed her to put her lisinopril/hctz on hold. 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 It was possibly reentrant. She does well with diltiazem. No change in therapy.   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   Thyroid disease    Urinary incontinence    Past Surgical History:  Procedure Laterality Date   CATARACT EXTRACTION Left 8/11   CATARACT EXTRACTION Right 3/12   CHOLECYSTECTOMY  1994   Copsilotomy Laser Treatment Left 6/12   eye   ELBOW SURGERY  8/12   EYE SURGERY Left 09/2008   macular hole   EYE SURGERY Right 12/11   Lumbar Infusion  2011   MOLE REMOVAL  06/17/2015   TOTAL KNEE ARTHROPLASTY Left 6/11   TOTAL KNEE ARTHROPLASTY Right 06/13/2011    Allergies  Allergen Reactions   Keflex [Cephalexin] Diarrhea   Macrobid [Nitrofurantoin]  Hives   Amoxicillin Rash    Allergies as of 11/09/2022       Reactions   Keflex [cephalexin] Diarrhea   Macrobid [nitrofurantoin] Hives   Amoxicillin Rash        Medication List        Accurate as of November 09, 2022 11:59  PM. If you have any questions, ask your nurse or doctor.          albuterol 108 (90 Base) MCG/ACT inhaler Commonly known as: VENTOLIN HFA Inhale 2 puffs into the lungs every 6 (six) hours as needed for wheezing or shortness of breath.   bifidobacterium infantis capsule Take 1 capsule by mouth daily.   CALCIUM PLUS VITAMIN D3 PO Take 1 tablet by mouth daily. Vitamin D3 + Calcium 600   ferrous sulfate 325 (65 FE) MG EC tablet Take 325 mg by mouth as directed. Once A Day on Mon, Thu   gabapentin 300 MG capsule Commonly known as: NEURONTIN Take 300 mg by mouth at bedtime.   ipratropium-albuterol 0.5-2.5 (3) MG/3ML Soln Commonly known as: DUONEB Inhale 3 mLs into the lungs every 8 (eight) hours as needed (wheezing).   levothyroxine 25 MCG tablet Commonly known as: SYNTHROID Give 1 tablet orally one time a day every 2 day(s); 2 tablets (50 mcg) one time a day every 2 day(s)   loperamide 2 MG capsule Commonly known as: IMODIUM Take 2 mg by mouth every 4 (four) hours as needed for diarrhea or loose stools.   nystatin powder Commonly known as: MYCOSTATIN/NYSTOP Apply 1 application  topically in the morning, at noon, in the evening, and at bedtime. To genital areas and inner thigh   nystatin cream Commonly known as: MYCOSTATIN Apply 1 Application topically at bedtime.   Protonix 40 MG tablet Generic drug: pantoprazole Take 40 mg by mouth daily.   triamcinolone cream 0.5 % Commonly known as: KENALOG Apply 1 Application topically at bedtime.   Vitamin D3 50 MCG (2000 UT) Tabs Take 2 tablets by mouth daily.        Review of Systems  Constitutional:  Positive for unexpected weight change. Negative for appetite change, fatigue and  fever.  HENT:  Negative for congestion, sore throat and trouble swallowing.   Eyes:  Negative for visual disturbance.  Respiratory:  Negative for cough, shortness of breath and wheezing.   Cardiovascular:  Positive for leg swelling.  Gastrointestinal:  Negative for abdominal pain, constipation and nausea.  Genitourinary:  Negative for dysuria, frequency and urgency.       Urogenital itching upon urination w/o skin irritation or rash.  Musculoskeletal:  Positive for arthralgias and gait problem.       Lower back muscle spasm is better controlled  Skin:  Positive for wound. Negative for color change and rash.       currently drainage opening present.   Neurological:  Negative for speech difficulty and weakness.  Psychiatric/Behavioral:  Negative for confusion and sleep disturbance. The patient is not nervous/anxious.     Immunization History  Administered Date(s) Administered   Influenza Split 01/28/2008, 02/17/2009, 02/20/2012, 02/11/2013, 02/02/2016, 01/11/2017, 02/04/2019   Influenza, High Dose Seasonal PF 02/04/2015, 01/11/2017, 01/30/2018, 02/05/2020   Influenza-Unspecified 02/16/2022   Moderna SARS-COV2 Booster Vaccination 02/24/2022   Moderna Sars-Covid-2 Vaccination 05/09/2019, 05/27/2019, 03/03/2020, 09/04/2020, 11/04/2020   Pfizer Covid-19 Vaccine Bivalent Booster 47yrs & up 02/09/2021   Pneumococcal Conjugate-13 08/30/2013   Pneumococcal Polysaccharide-23 11/03/2005   RSV,unspecified 04/22/2022   Td 08/01/2001, 09/05/2019   Tdap 09/22/2010, 11/24/2020   Zoster Recombinant(Shingrix) 11/08/2016, 01/11/2017   Zoster, Live 11/04/2005, 11/08/2016, 01/11/2017   Zoster, Unspecified 01/11/2017   Pertinent  Health Maintenance Due  Topic Date Due   INFLUENZA VACCINE  11/24/2022   DEXA SCAN  Completed      04/15/2022   10:15 AM 04/27/2022   11:35  AM 05/31/2022    9:34 AM 07/26/2022    9:31 AM 10/21/2022    9:31 AM  Fall Risk  Falls in the past year? 0 0 0 0 0  Was there an  injury with Fall? 0 0 0 0 0  Fall Risk Category Calculator 0 0 0 0 0  Fall Risk Category (Retired) Low Low     (RETIRED) Patient Fall Risk Level Moderate fall risk Moderate fall risk     Patient at Risk for Falls Due to History of fall(s) History of fall(s) History of fall(s)  No Fall Risks  Fall risk Follow up Falls evaluation completed Falls evaluation completed Falls evaluation completed  Falls evaluation completed   Functional Status Survey:    Vitals:   11/10/22 1029  BP: 136/72  Pulse: 76  Resp: 18  Temp: 97.8 F (36.6 C)  SpO2: 95%  Weight: (!) 303 lb 12.8 oz (137.8 kg)   Body mass index is 46.19 kg/m. Physical Exam Vitals and nursing note reviewed.  Constitutional:      Appearance: Normal appearance. She is obese.  HENT:     Head: Normocephalic and atraumatic.     Nose: Nose normal.     Mouth/Throat:     Mouth: Mucous membranes are moist.  Eyes:     Extraocular Movements: Extraocular movements intact.     Conjunctiva/sclera: Conjunctivae normal.     Pupils: Pupils are equal, round, and reactive to light.  Cardiovascular:     Rate and Rhythm: Normal rate and regular rhythm.     Heart sounds: No murmur heard. Pulmonary:     Effort: Pulmonary effort is normal.     Breath sounds: Wheezing present. No rales.  Abdominal:     General: Bowel sounds are normal.     Palpations: Abdomen is soft.     Tenderness: There is no abdominal tenderness.  Musculoskeletal:     Cervical back: Normal range of motion and neck supple.     Right lower leg: Edema present.     Left lower leg: Edema present.     Comments: R hip, prosthetic hip removed. Dependent edema moderate to severe  Skin:    General: Skin is warm and dry.     Comments: Right hip surgical scar, right hip drainage site.  Edema mostly in the dependent areas, buttock, thighs, legs.   Neurological:     General: No focal deficit present.     Mental Status: She is alert and oriented to person, place, and time.      Gait: Gait abnormal.  Psychiatric:        Mood and Affect: Mood normal.        Behavior: Behavior normal.        Thought Content: Thought content normal.        Judgment: Judgment normal.     Labs reviewed: Recent Labs    07/04/22 0502 07/05/22 0538 07/06/22 4098 07/08/22 1714 07/12/22 0437 07/13/22 0412 10/17/22 0327 10/18/22 0338 10/19/22 0339  NA 134* 135 136   < > 133*   < > 132* 134* 134*  K 4.2 3.7 3.4*   < > 3.0*   < > 3.9 3.9 3.7  CL 104 106 109   < > 102   < > 103 106 107  CO2 20* 19* 19*   < > 20*   < > 20* 21* 21*  GLUCOSE 96 94 106*   < > 113*   < > 99 91 88  BUN 25* 24* 20   < > 20   < > 58* 49* 42*  CREATININE 1.21* 1.14* 1.01*   < > 1.21*   < > 2.63* 2.12* 2.04*  CALCIUM 8.2* 8.4* 8.4*   < > 7.7*   < > 8.0* 8.0* 7.9*  MG 2.4 2.4 2.4  --  1.8  --   --   --   --   PHOS 3.6 4.3 3.6  --   --   --   --   --   --    < > = values in this interval not displayed.   Recent Labs    07/05/22 0538 07/06/22 0702 07/21/22 0000 08/18/22 0000 10/13/22 1855  AST 30 36 39* 22 20  ALT 21 25 48* 18 17  ALKPHOS 66 63 64 85 85  BILITOT 0.6 0.3  --   --  1.1  PROT 6.3* 6.4*  --   --  7.0  ALBUMIN 2.2* 2.3* 3.0* 2.9* 2.7*   Recent Labs    07/06/22 0702 07/08/22 1714 07/08/22 2208 07/21/22 0000 10/13/22 1855 10/15/22 0349 10/16/22 1125 10/19/22 0339  WBC 11.9* 15.6*   < > 6.4   < > 14.8* 16.9* 11.9*  NEUTROABS 9.5* 12.1*  --  4,230.00  --   --   --   --   HGB 10.8* 11.7*   < > 11.7*   < > 11.5* 10.7* 11.5*  HCT 37.0 38.5   < > 36   < > 36.6 34.9* 37.1  MCV 91.8 90.2   < >  --    < > 88.6 89.7 89.6  PLT 536* 607*   < > 221   < > 156 159 261   < > = values in this interval not displayed.   Lab Results  Component Value Date   TSH 4.51 06/30/2021   No results found for: "HGBA1C" No results found for: "CHOL", "HDL", "LDLCALC", "LDLDIRECT", "TRIG", "CHOLHDL"  Significant Diagnostic Results in last 30 days:  No results  found.  Assessment/Plan: Leukocytosis wbc was trended down from 16.9 10/16/22 >>11.9 10/19/22 11/20/22 f/u CBC/diff  Weight gain  persisted weigh gains: #8Ibs in the past 3 weeks, her baseline weight 6-7 months ago was 280s, then significant weight gained with onset of acute CHF, placed on diuretics, then weight fluctuating #20Ibs. Care plan meeting: requested appetite suppressant and weight loss medication, spoke with the patient benefit vs risk of medication of weight loss, the patient declined it and desires to watch her diet for now Will update TSH, monitor for decompensation of CHF  CKD (chronic kidney disease) stage 4, GFR 15-29 ml/min (HCC) Bun/creat 42/2.04 10/19/22, 21/1.86 11/09/22, f/u Nephrology, update CMP/eGFR  Chronic diastolic CHF (congestive heart failure) (HCC) dependent  areas, resumed Furosemide, off Spironolactone, obtain BNP in setting of weigh gains and CKD stage 4  Acquired hypothyroidism  takes Levothyroxine, TSH 3.48 06/28/22, repeat TSH in setting of weight gains.  11/15/22 BNP 98, Na 138, K 4.0, Bun 18, creat 1.91, wbc 6.2, Hgb 11.4, plt 322, TSH 24.34 11/16/22 taking Levothyroxine and every other day, repeat TSH in am 725/24 TSH 26.19 11/18/22 Dr. Hyacinth Meeker advised to increase Levothyroxine to po daily, repeat TSH in 2 months.   GERD (gastroesophageal reflux disease)  better on  Pantoprazole, off  Omeprazole, prn Zofran. Hgb 11.5 10/19/22  Essential hypertension Blood pressure is controlled, resume Furosemide, off Spironolactone.  Muscle spasm Resolved, dc Gabapentin in setting of weight gains.  Family/ staff Communication: plan of care reviewed with the patient and charge nurse.   Labs/tests ordered:  CBC/diff, CMP/eGFR, TSH  Time spend 35 minutes.

## 2022-11-10 NOTE — Assessment & Plan Note (Signed)
Bun/creat 42/2.04 10/19/22, 21/1.86 11/09/22, f/u Nephrology, update CMP/eGFR

## 2022-11-10 NOTE — Assessment & Plan Note (Signed)
Resolved, dc Gabapentin in setting of weight gains.

## 2022-11-10 NOTE — Assessment & Plan Note (Addendum)
takes Levothyroxine, TSH 3.48 06/28/22, repeat TSH in setting of weight gains.  11/15/22 BNP 98, Na 138, K 4.0, Bun 18, creat 1.91, wbc 6.2, Hgb 11.4, plt 322, TSH 24.34 11/16/22 taking Levothyroxine and every other day, repeat TSH in am 725/24 TSH 26.19 11/18/22 Dr. Hyacinth Meeker advised to increase Levothyroxine to po daily, repeat TSH in 2 months.

## 2022-11-10 NOTE — Assessment & Plan Note (Signed)
persisted weigh gains: #8Ibs in the past 3 weeks, her baseline weight 6-7 months ago was 280s, then significant weight gained with onset of acute CHF, placed on diuretics, then weight fluctuating #20Ibs. Care plan meeting: requested appetite suppressant and weight loss medication, spoke with the patient benefit vs risk of medication of weight loss, the patient declined it and desires to watch her diet for now Will update TSH, monitor for decompensation of CHF

## 2022-11-10 NOTE — Assessment & Plan Note (Signed)
dependent  areas, resumed Furosemide, off Spironolactone, obtain BNP in setting of weigh gains and CKD stage 4

## 2022-11-10 NOTE — Assessment & Plan Note (Signed)
better on  Pantoprazole, off  Omeprazole, prn Zofran. Hgb 11.5 10/19/22

## 2022-11-15 LAB — HEPATIC FUNCTION PANEL
ALT: 12 U/L (ref 7–35)
AST: 19 (ref 13–35)
Alkaline Phosphatase: 84 (ref 25–125)
Bilirubin, Total: 0.4

## 2022-11-15 LAB — BASIC METABOLIC PANEL
BUN: 18 (ref 4–21)
CO2: 24 — AB (ref 13–22)
Chloride: 107 (ref 99–108)
Creatinine: 1.9 — AB (ref 0.5–1.1)
Glucose: 90
Potassium: 4 mEq/L (ref 3.5–5.1)
Sodium: 138 (ref 137–147)

## 2022-11-15 LAB — TSH: TSH: 24.34 — AB (ref 0.41–5.90)

## 2022-11-15 LAB — CBC: RBC: 4.21 (ref 3.87–5.11)

## 2022-11-15 LAB — COMPREHENSIVE METABOLIC PANEL
Albumin: 3.3 — AB (ref 3.5–5.0)
Calcium: 8.6 — AB (ref 8.7–10.7)
Globulin: 3.8
eGFR: 26

## 2022-11-15 LAB — CBC AND DIFFERENTIAL
HCT: 36 (ref 36–46)
Hemoglobin: 11.4 — AB (ref 12.0–16.0)
Neutrophils Absolute: 3379
Platelets: 322 10*3/uL (ref 150–400)
WBC: 6.2

## 2022-11-17 ENCOUNTER — Encounter: Payer: Self-pay | Admitting: Infectious Diseases

## 2022-11-17 ENCOUNTER — Ambulatory Visit (INDEPENDENT_AMBULATORY_CARE_PROVIDER_SITE_OTHER): Payer: Medicare Other | Admitting: Infectious Diseases

## 2022-11-17 ENCOUNTER — Other Ambulatory Visit: Payer: Self-pay

## 2022-11-17 VITALS — BP 147/61 | HR 81 | Resp 16 | Ht 68.0 in

## 2022-11-17 DIAGNOSIS — S71001D Unspecified open wound, right hip, subsequent encounter: Secondary | ICD-10-CM | POA: Diagnosis not present

## 2022-11-17 DIAGNOSIS — I509 Heart failure, unspecified: Secondary | ICD-10-CM

## 2022-11-17 DIAGNOSIS — T148XXA Other injury of unspecified body region, initial encounter: Secondary | ICD-10-CM

## 2022-11-17 DIAGNOSIS — Z79899 Other long term (current) drug therapy: Secondary | ICD-10-CM

## 2022-11-17 DIAGNOSIS — N189 Chronic kidney disease, unspecified: Secondary | ICD-10-CM

## 2022-11-17 LAB — TSH: TSH: 26.19 — AB (ref 0.41–5.90)

## 2022-11-17 NOTE — Progress Notes (Addendum)
Patient Active Problem List   Diagnosis Date Noted   Chronic wound 11/17/2022   AKI (acute kidney injury) (HCC) 10/14/2022   Acute kidney injury (HCC) 10/13/2022   Acute kidney injury superimposed on chronic kidney disease (HCC) 10/13/2022   Muscle spasm 09/05/2022   Medication management 07/27/2022   Cellulitis of left thigh 07/27/2022   Cutaneous abscess of right hip 07/21/2022   Hyponatremia 07/21/2022   Vaginal bleeding 07/20/2022   Multifocal pneumonia 07/08/2022   Leukocytosis 07/08/2022   Acute on chronic diastolic CHF (congestive heart failure) (HCC) 07/08/2022   Chronic anemia 07/08/2022   Infected abrasion of right hip, initial encounter 07/03/2022   Cellulitis of right hip 07/03/2022   Atypical chest pain 07/03/2022   Chronic diastolic CHF (congestive heart failure) (HCC) 07/03/2022   Weight gain 06/27/2022   Malaise and fatigue 06/27/2022   COVID-19 virus infection 05/16/2022   Ingrown left greater toenail 04/12/2022   Dysuria 04/12/2022   Absence of hip joint, 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   Gait disorder 01/27/2022   Pressure ulcer, stage 2 (HCC) 07/12/2021   Candidiasis of skin 04/13/2021   UTI (urinary tract infection) 04/02/2021   Hypokalemia 02/26/2021   Pressure ulcer of thigh, stage 2 (HCC) 02/19/2021   Elevated liver enzymes 02/16/2021   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   Right hip pain 02/05/2021   Sepsis (HCC) 02/05/2021   Blood loss anemia 02/05/2021   Rheumatoid arthritis (HCC) 02/05/2021   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   Acquired hypothyroidism 02/05/2021   Drug rash 02/05/2021   CKD (chronic kidney disease) stage 4, GFR 15-29 ml/min (HCC) 01/30/2019   Astigmatism with presbyopia, bilateral 01/22/2019   Blepharitis of both upper and lower eyelid 01/22/2019   Dermatochalasis of both upper eyelids 01/22/2019   Dry eyes, bilateral 01/22/2019   Epiretinal membrane (ERM) of both eyes 01/22/2019   Meibomian  gland dysfunction (MGD) of both eyes 01/22/2019   Pseudophakia of both eyes 01/22/2019   Vitreous syneresis of both eyes 01/22/2019   Acute cystitis without hematuria 11/01/2018   E-coli UTI 06/28/2018   Malodorous urine 06/28/2018   Class 3 obesity (HCC) 09/25/2017   Obstructive sleep apnea    Nasal sore 10/07/2016   Metabolic bone disease 09/01/2015   Vitamin D deficiency 09/01/2015   Chronic diarrhea 07/27/2015   Edema of lower extremity 11/11/2014   Cardiac disease 03/10/2014   Heart disease 03/10/2014   Encounter for preprocedural cardiovascular examination    Essential hypertension    GERD (gastroesophageal reflux disease)    Adiposity    Supraventricular tachycardia (HCC)    Decreased cardiac output    Urinary incontinence     Patient's Medications  New Prescriptions   No medications on file  Previous Medications   ALBUTEROL (VENTOLIN HFA) 108 (90 BASE) MCG/ACT INHALER    Inhale 2 puffs into the lungs every 6 (six) hours as needed for wheezing or shortness of breath.   BIFIDOBACTERIUM INFANTIS (ALIGN) CAPSULE    Take 1 capsule by mouth daily.   CALCIUM CARB-CHOLECALCIFEROL (CALCIUM PLUS VITAMIN D3 PO)    Take 1 tablet by mouth daily. Vitamin D3 + Calcium 600   CHOLECALCIFEROL (VITAMIN D3) 50 MCG (2000 UT) TABS    Take 2 tablets by mouth daily.   FERROUS SULFATE 325 (65 FE) MG EC TABLET    Take 325 mg by mouth as directed. Once A Day on Mon, Thu   GABAPENTIN (NEURONTIN) 300 MG CAPSULE  Take 300 mg by mouth at bedtime.   IPRATROPIUM-ALBUTEROL (DUONEB) 0.5-2.5 (3) MG/3ML SOLN    Inhale 3 mLs into the lungs every 8 (eight) hours as needed (wheezing).   LEVOTHYROXINE (SYNTHROID) 25 MCG TABLET    Give 1 tablet orally one time a day every 2 day(s); 2 tablets (50 mcg) one time a day every 2 day(s)   LOPERAMIDE (IMODIUM) 2 MG CAPSULE    Take 2 mg by mouth every 4 (four) hours as needed for diarrhea or loose stools.   NYSTATIN (MYCOSTATIN/NYSTOP) POWDER    Apply 1  application  topically in the morning, at noon, in the evening, and at bedtime. To genital areas and inner thigh   NYSTATIN CREAM (MYCOSTATIN)    Apply 1 Application topically at bedtime.   PANTOPRAZOLE (PROTONIX) 40 MG TABLET    Take 40 mg by mouth daily.   TRIAMCINOLONE CREAM (KENALOG) 0.5 %    Apply 1 Application topically at bedtime.  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 Discharged on 3/21 with plan to complete 2 weeks course of ciprofloxacin and linezolid. EOT 07/26/22  07/27/22 - taking antibiotics as instructed per MAR. Cellulitis in the rt hip/thigh region has also improved but still has small opening.   7/24 - here for fu after recent admission 6/20-6/26 for nausea, vomiting, poor po intake leadking to aki. Accompanied by her husband who is more concerned about her weight gain. She was treated with IVF with resultant gradual improvement and plan to fu with Nephrology OP. Rt hip wound was stable with no concerns for infection. Patient denies any fevers, chills, nausea, vomiting and diarrhea. No concerns in the rt hip wound with no reported drainage, swelling or tenderness. She gets wound care at the facility.   Review of Systems: all systems reviewed with pertinent positives and negatives as listed above  Past  Medical History:  Diagnosis Date   Benign hypertension with chronic kidney disease, stage III (HCC)    Overview:  Last Assessment & Plan:  Usually the patient is hypertensive. However today her blood pressure is 105 systolic. She has been feeling fatigued with some lightheadedness. Part of this may be related to her lower blood pressure. Her pressure may be improved with her decrease in salt and fluid intake. I've instructed her to put her lisinopril/hctz on hold. I've asked her to see her primary    Cardiac disease 03/10/2014   Chronic diarrhea 07/27/2015   Edema 07/27/2015   Ejection fraction 2004   Normal, echo,    Gastroesophageal reflux disease    GERD (gastroesophageal reflux disease)    Heart disease 03/10/2014   Hypertension    Left bundle branch block 09/01/2020   Obesity    Obesity (BMI 30-39.9) 09/25/2017   OSA (obstructive sleep apnea)    mild with AHI 6.75 -  on CPAP   Preop cardiovascular exam 11/2010   Cardiac clearance for knee surgery    Sleep apnea 03/10/2014   Supraventricular tachycardia    Documented episode in the past, possibly reentrant tachycardia  Overview:  Overview:  Documented episode in the past, 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 It was possibly reentrant. She does well with diltiazem. No change in therapy.   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   Thyroid disease    Urinary incontinence    Past Surgical History:  Procedure Laterality Date   CATARACT EXTRACTION Left 8/11   CATARACT EXTRACTION Right 3/12   CHOLECYSTECTOMY  1994   Copsilotomy Laser Treatment Left 6/12   eye   ELBOW SURGERY  8/12   EYE SURGERY Left 09/2008   macular hole   EYE SURGERY Right 12/11   Lumbar Infusion  2011   MOLE REMOVAL  06/17/2015   TOTAL KNEE ARTHROPLASTY Left 6/11   TOTAL KNEE ARTHROPLASTY Right  06/13/2011    Social History   Tobacco Use   Smoking status: Former    Passive exposure: Never   Smokeless tobacco: Never  Vaping Use   Vaping status: Never Used  Substance Use Topics   Alcohol use: No   Drug use: No    Family History  Problem Relation Age of Onset   High blood pressure Mother    Diabetes Mother    Heart Problems Father    Diabetes Father    Prostate cancer Father    Heart attack Father    Heart attack Paternal Grandmother    Heart attack Maternal Grandfather    Heart attack Paternal Uncle    Stroke Neg Hx     Allergies  Allergen Reactions   Keflex [Cephalexin] Diarrhea   Macrobid [Nitrofurantoin] Hives   Amoxicillin Rash    Health Maintenance  Topic Date Due   Medicare Annual Wellness (AWV)  11/12/2022   COVID-19 Vaccine (6 - 2023-24 season) 12/03/2022 (Originally 04/21/2022)   INFLUENZA VACCINE  11/24/2022   DTaP/Tdap/Td (5 - Td or Tdap) 11/25/2030   Pneumonia Vaccine 18+ Years old  Completed   DEXA SCAN  Completed   Zoster Vaccines- Shingrix  Completed   HPV VACCINES  Aged Out    Objective: BP (!) 147/61   Pulse 81   Resp 16   Ht 5\' 8"  (1.727 m)   BMI 46.19 kg/m   Physical Exam Constitutional:      Appearance: Normal appearance. Morbidly Obese  HENT:     Head: Normocephalic and atraumatic.      Mouth: Mucous membranes are moist.  Eyes:    Conjunctiva/sclera: Conjunctivae normal.     Pupils:   Cardiovascular:     Rate and Rhythm: Normal rate and regular rhythm.     Heart sounds:   Pulmonary:     Effort: Pulmonary effort is normal.     Breath sounds:   Abdominal:     General: Non distended     Palpations: soft.   Musculoskeletal:        General: She is lying down in a stretcher. Exam limited as she is non weight bearing/nonambulatory  Small open wound in the rt hip/thigh, chronic with no active signs of infection, bilateral 2+ pitting edema in the lower extremities   Skin:    General: Skin is warm and dry.  Comments:  Neurological:     General:     Mental Status: awake, alert and oriented to person, place, and time. Non ambulatory status   Psychiatric:        Mood and Affect: Mood normal.   Lab Results Lab Results  Component Value Date   WBC 11.9 (H) 10/19/2022   HGB 11.5 (L) 10/19/2022   HCT 37.1 10/19/2022   MCV 89.6 10/19/2022   PLT 261 10/19/2022    Lab Results  Component Value Date   CREATININE 2.04 (H) 10/19/2022   BUN 42 (H) 10/19/2022   NA 134 (L) 10/19/2022   K 3.7 10/19/2022   CL 107 10/19/2022   CO2 21 (L) 10/19/2022    Lab Results  Component Value Date   ALT 17 10/13/2022   AST 20 10/13/2022   ALKPHOS 85 10/13/2022   BILITOT 1.1 10/13/2022    No results found for: "CHOL", "HDL", "LDLCALC", "LDLDIRECT", "TRIG", "CHOLHDL" No results found for: "LABRPR", "RPRTITER" No results found for: "HIV1RNAQUANT", "HIV1RNAVL", "CD4TABS"  Assessment/Plan # Chronic rt hip/thigh wound  - no signs of infection and no indication of abtx - continue wound care - Fu as needed   # CHF/Volume overload  - per Cardiology/Nephrology   # CKD - per Nephrology    I have personally spent 43  minutes involved in face-to-face and non-face-to-face activities for this patient on the day of the visit. Professional time spent includes the following activities: Preparing to see the patient (review of tests), Obtaining and/or reviewing separately obtained history (admission/discharge record), Performing a medically appropriate examination and/or evaluation , Ordering medications/tests/procedures, referring and communicating with other health care professionals, Documenting clinical information in the EMR, Independently interpreting results (not separately reported), Communicating results to the patient/family/caregiver, Counseling and educating the patient/family/caregiver and Care coordination (not separately reported).   Victoriano Lain, MD [facility] 11/17/2022, 10:45 AM

## 2022-11-17 NOTE — Patient Instructions (Signed)
No need for antibiotics She needs to follow up with wound care  Fu with [place] as needed

## 2022-11-22 ENCOUNTER — Non-Acute Institutional Stay (SKILLED_NURSING_FACILITY): Payer: Medicare Other | Admitting: Nurse Practitioner

## 2022-11-22 ENCOUNTER — Encounter: Payer: Self-pay | Admitting: Nurse Practitioner

## 2022-11-22 DIAGNOSIS — D500 Iron deficiency anemia secondary to blood loss (chronic): Secondary | ICD-10-CM

## 2022-11-22 DIAGNOSIS — I5032 Chronic diastolic (congestive) heart failure: Secondary | ICD-10-CM | POA: Diagnosis not present

## 2022-11-22 DIAGNOSIS — N184 Chronic kidney disease, stage 4 (severe): Secondary | ICD-10-CM | POA: Diagnosis not present

## 2022-11-22 DIAGNOSIS — F418 Other specified anxiety disorders: Secondary | ICD-10-CM

## 2022-11-22 DIAGNOSIS — E039 Hypothyroidism, unspecified: Secondary | ICD-10-CM

## 2022-11-22 DIAGNOSIS — E871 Hypo-osmolality and hyponatremia: Secondary | ICD-10-CM | POA: Diagnosis not present

## 2022-11-22 DIAGNOSIS — M62838 Other muscle spasm: Secondary | ICD-10-CM

## 2022-11-22 DIAGNOSIS — K2100 Gastro-esophageal reflux disease with esophagitis, without bleeding: Secondary | ICD-10-CM

## 2022-11-22 DIAGNOSIS — I10 Essential (primary) hypertension: Secondary | ICD-10-CM

## 2022-11-22 DIAGNOSIS — F5105 Insomnia due to other mental disorder: Secondary | ICD-10-CM

## 2022-11-22 DIAGNOSIS — T8451XS Infection and inflammatory reaction due to internal right hip prosthesis, sequela: Secondary | ICD-10-CM

## 2022-11-22 DIAGNOSIS — M069 Rheumatoid arthritis, unspecified: Secondary | ICD-10-CM

## 2022-11-22 NOTE — Assessment & Plan Note (Signed)
Bun/creat 42/2.04 10/19/22, 21/1.86 11/09/22, 18/1.91 [id number] f/u Nephrology

## 2022-11-22 NOTE — Assessment & Plan Note (Signed)
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 F/u Ortho. TDWB. Power wc. 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 Fully treated, then off antibiotics. 11/17/22 ID no infection, no tx, f/u wound care

## 2022-11-22 NOTE — Assessment & Plan Note (Signed)
Blood pressure is controlled,  resume Furosemide, off Spironolactone.

## 2022-11-22 NOTE — Assessment & Plan Note (Signed)
f/u Rheumatology, hx of Plaquenil use, stable presently.  ?

## 2022-11-22 NOTE — Assessment & Plan Note (Signed)
CHF/Edema, dependent  areas, resumed Furosemide, off Spironolactone, BNP 98 11/15/22

## 2022-11-22 NOTE — Progress Notes (Signed)
Location:  [facility] Room Number: N056-A Place of Service:  SNF (31) Provider:  ,  X, NP    Patient Care Team: Heffner, Jimmye Norman, MD as PCP - General (Family Medicine) [name], MD as PCP - Cardiology (Cardiology) [name], MD as PCP - Sleep Medicine (Cardiology) [name], MD (Sports Medicine) [name], MD (Ophthalmology) [name], [name], MD (Dermatology) [name], MD (Gastroenterology) [name], MD (Nephrology) [name], DC as Referring Physician (Chiropractic Medicine)  Extended Emergency Contact Information Primary Emergency Contact: [facility] Address: [address]          [place], [place] [address] [place] Home Phone: [phone] Mobile Phone: [phone] Relation: Spouse  Code Status:  DNR Goals of care: Advanced Directive information    11/22/2022   10:36 AM  Advanced Directives  Type of Advance Directive Living will;Out of facility DNR (pink MOST or yellow form)  Does patient want to make changes to medical advance directive? No - Patient declined  Pre-existing out of facility DNR order (yellow form or pink MOST form) Yellow form placed in chart (order not valid for inpatient use)     Chief Complaint  Patient presents with   Medical agement of Chronic Issues    Routine Visit   Quality Metric Gaps    Needs to discuss Medicare Annual Wellness Visit    HPI:  Pt is a 82 y.o. female seen today for medical management of chronic diseases.     Hospitalized AKI 10/13/22-10/19/22 for AKI, repeat BMP 2x/wk until creat back to baseline, may resume Furosemide              Hospitalized 07/08/22 for CHF, abscess of the right hip which previous infected, treated, and prosthesis was removed and never replaced. Open drainage site presents.               Hospitalized 07/02/22-07/07/22 for the right hip cellulitis, CT hip in hospital fluid collection  unclear if it communicates with the joint space, Ortho not felt to be related to the joint space and may be an old fluid collection, aspiration not recommended.              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 F/u Ortho. TDWB. Power wc. 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 Fully treated, then off antibiotics. 11/17/22 ID no infection, no tx, f/u wound care              Elevated AST/ALT normalized, S/p cholecystectomy, [place] 02/19/21 no cyst or mass.              Anemia, post op, s/p EGD no active bleed. S/p 6 u PRBC transfusion, ASA was dc'd, On Fe, Iron 15 in hospital, Hgb 11.4 11/15/22             Muscle spasm, controlled, dc Gabapentin I setting of weight gain, prn Methocarbamol.              HTN  resume Furosemide, off Spironolactone.              Morbid obesity             OSA CPAP             Chronic diarrhea, prn Imodium, Align             RA f/u Rheumatology, hx of Plaquenil use, stable  presently.              Insomnia/depression/anxiety, stable, off Mirtazapine.              GERD, stable, on  Pantoprazole, off  Omeprazole, prn Zofran. Hgb 11.4 11/15/22             CHF/Edema, dependent  areas, resumed Furosemide, off Spironolactone, BNP 98 11/15/22 CKD  Bun/creat 42/2.04 10/19/22, 21/1.86 11/09/22, 18/1.91 [address] f/u Nephrology Hyponatremia, Na 138 11/15/22             Hypothyroidism, increased Levothyroxine every day/77mcg and alternating, TSH 3.48 06/28/22<<24.34 11/15/22<<26.19 11/17/22, repeat TSH 2 months from 11/18/22   Past Medical History:  Diagnosis Date   Benign hypertension with chronic kidney disease, stage III (HCC)    Overview:  Last Assessment & Plan:  Usually the patient is hypertensive. However today her blood pressure is 105 systolic. She has been feeling fatigued with some lightheadedness. Part of this may be related to her lower blood pressure. Her pressure may be improved with her decrease in salt and  fluid intake. I've instructed her to put her lisinopril/hctz on hold. 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 It was possibly reentrant. She does well with diltiazem. No change in therapy.   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   Thyroid disease    Urinary incontinence    Past Surgical History:  Procedure Laterality Date   CATARACT EXTRACTION Left 8/11   CATARACT EXTRACTION Right 3/12   CHOLECYSTECTOMY  1994   Copsilotomy Laser Treatment Left 6/12   eye   ELBOW SURGERY  8/12   EYE SURGERY Left 09/2008   macular hole   EYE SURGERY Right 12/11   Lumbar Infusion  2011   MOLE REMOVAL  06/17/2015   TOTAL KNEE ARTHROPLASTY Left 6/11   TOTAL KNEE ARTHROPLASTY Right 06/13/2011    Allergies  Allergen Reactions   Keflex [Cephalexin] Diarrhea   Macrobid [Nitrofurantoin] Hives   Amoxicillin Rash    Outpatient Encounter Medications as of 11/22/2022  Medication Sig   albuterol (VENTOLIN HFA) 108 (90 Base) MCG/ACT inhaler Inhale 2 puffs into the lungs every 6 (six) hours as needed for wheezing or shortness of breath.   bifidobacterium infantis (ALIGN) capsule  Take 1 capsule by mouth daily.   Calcium Carb-Cholecalciferol (CALCIUM PLUS VITAMIN D3 PO) Take 1 tablet by mouth daily. Vitamin D3 + Calcium 600   Cholecalciferol (VITAMIN D3) 50 MCG (2000 UT) TABS Take 2 tablets by mouth daily.   ferrous sulfate 325 (65 FE) MG EC tablet Take 325 mg by mouth  as directed. Once A Day on Mon, Thu   furosemide (LASIX) 20 MG tablet Take 20 mg by mouth daily.   ipratropium-albuterol (DUONEB) 0.5-2.5 (3) MG/3ML SOLN Inhale 3 mLs into the lungs every 8 (eight) hours as needed (wheezing).   levothyroxine (SYNTHROID) 25 MCG tablet Give 1 tablet orally one time a day every 2 day(s); 2 tablets (50 mcg) one time a day every 2 day(s)   loperamide (IMODIUM) 2 MG capsule Take 2 mg by mouth every 4 (four) hours as needed for diarrhea or loose stools.   nystatin (MYCOSTATIN/NYSTOP) powder Apply 1 application  topically in the morning, at noon, in the evening, and at bedtime. To genital areas and inner thigh   nystatin cream (MYCOSTATIN) Apply 1 Application topically at bedtime.   ondansetron (ZOFRAN) 4 MG tablet Take 4 mg by mouth every 8 (eight) hours as needed for nausea or vomiting.   pantoprazole (PROTONIX) 40 MG tablet Take 40 mg by mouth daily.   triamcinolone cream (KENALOG) 0.5 % Apply 1 Application topically at bedtime.   [DISCONTINUED] gabapentin (NEURONTIN) 300 MG capsule Take 300 mg by mouth at bedtime.   No facility-administered encounter medications on file as of 11/22/2022.    Review of Systems  Constitutional:  Positive for unexpected weight change. Negative for appetite change, fatigue and fever.  HENT:  Negative for congestion, sore throat and trouble swallowing.   Eyes:  Negative for visual disturbance.  Respiratory:  Negative for cough, shortness of breath and wheezing.   Cardiovascular:  Positive for leg swelling.  Gastrointestinal:  Negative for abdominal pain, constipation and nausea.  Genitourinary:  Negative for dysuria, frequency and urgency.        Urogenital itching upon urination w/o skin irritation or rash.  Musculoskeletal:  Positive for arthralgias and gait problem.       Lower back muscle spasm is better controlled  Skin:  Positive for wound. Negative for color change and rash.       currently drainage opening present.   Neurological:  Negative for speech difficulty and weakness.  Psychiatric/Behavioral:  Negative for confusion and sleep disturbance. The patient is not nervous/anxious.     Immunization History  Administered Date(s) Administered   Influenza Split 01/28/2008, 02/17/2009, 02/20/2012, 02/11/2013, 02/02/2016, 01/11/2017, 02/04/2019   Influenza, High Dose Seasonal PF 02/04/2015, 01/11/2017, 01/30/2018, 02/05/2020   Influenza-Unspecified 02/16/2022   Moderna SARS-COV2 Booster Vaccination 02/24/2022   Moderna Sars-Covid-2 Vaccination 05/09/2019, 05/27/2019, 03/03/2020, 09/04/2020, 11/04/2020   Pfizer Covid-19 Vaccine Bivalent Booster 52yrs & up 02/09/2021   Pneumococcal Conjugate-13 08/30/2013   Pneumococcal Polysaccharide-23 11/03/2005   RSV,unspecified 04/22/2022   Td 08/01/2001, 09/05/2019   Tdap 09/22/2010, 11/24/2020   Zoster Recombinant(Shingrix) 11/08/2016, 01/11/2017   Zoster, Live 11/04/2005, 11/08/2016, 01/11/2017   Zoster, Unspecified 01/11/2017   Pertinent  Health Maintenance Due  Topic Date Due   INFLUENZA VACCINE  11/24/2022   DEXA SCAN  Completed      04/15/2022   10:15 AM 04/27/2022   11:35 AM 05/31/2022    9:34 AM 07/26/2022    9:31 AM 10/21/2022    9:31 AM  Fall Risk  Falls in the past year? 0 0 0 0 0  Was there an injury with Fall? 0 0 0 0 0  Fall Risk Category Calculator 0 0 0 0 0  Fall Risk Category (Retired) Low Low     (RETIRED) Patient Fall Risk Level Moderate fall risk Moderate fall risk     Patient at Risk for Falls Due to History  of fall(s) History of fall(s) History of fall(s)  No Fall Risks  Fall risk Follow up Falls evaluation completed Falls evaluation completed Falls  evaluation completed  Falls evaluation completed   Functional Status Survey:    Vitals:   11/22/22 1016  BP: 137/70  Pulse: 70  Resp: 18  Temp: 97.8 F (36.6 C)  SpO2: 95%  Weight: (!) 303 lb 12.8 oz (137.8 kg)  Height: 5\' 8"  (1.727 m)   Body mass index is 46.19 kg/m. Physical Exam Vitals and nursing note reviewed.  Constitutional:      Appearance: Normal appearance. She is obese.  HENT:     Head: Normocephalic and atraumatic.     Nose: Nose normal.     Mouth/Throat:     Mouth: Mucous membranes are moist.  Eyes:     Extraocular Movements: Extraocular movements intact.     Conjunctiva/sclera: Conjunctivae normal.     Pupils: Pupils are equal, round, and reactive to light.  Cardiovascular:     Rate and Rhythm: Normal rate and regular rhythm.     Heart sounds: No murmur heard. Pulmonary:     Effort: Pulmonary effort is normal.     Breath sounds: Wheezing present. No rales.  Abdominal:     General: Bowel sounds are normal.     Palpations: Abdomen is soft.     Tenderness: There is no abdominal tenderness.  Musculoskeletal:     Cervical back: Normal range of motion and neck supple.     Right lower leg: Edema present.     Left lower leg: Edema present.     Comments: R hip, prosthetic hip removed. Dependent edema moderate to severe  Skin:    General: Skin is warm and dry.     Comments: Right hip surgical scar, right hip drainage site.  Edema mostly in the dependent areas, buttock, thighs, legs.   Neurological:     General: No focal deficit present.     Mental Status: She is alert and oriented to person, place, and time.     Gait: Gait abnormal.  Psychiatric:        Mood and Affect: Mood normal.        Behavior: Behavior normal.        Thought Content: Thought content normal.        Judgment: Judgment normal.     Labs reviewed: Recent Labs    07/04/22 0502 07/05/22 0538 07/06/22 0702 07/08/22 1714 07/12/22 0437 07/13/22 0412 10/17/22 0327 10/18/22 0338  10/19/22 0339 10/25/22 0000 11/03/22 0000 11/08/22 0000 11/15/22 0000  NA 134* 135 136   < > 133*   < > 132* 134* 134*   < > 138 138 138  K 4.2 3.7 3.4*   < > 3.0*   < > 3.9 3.9 3.7   < > 4.2 4.4 4.0  CL 104 106 109   < > 102   < > 103 106 107   < > 108 108 107  CO2 20* 19* 19*   < > 20*   < > 20* 21* 21*   < > 23* 23* 24*  GLUCOSE 96 94 106*   < > 113*   < > 99 91 88  --   --   --   --   BUN 25* 24* 20   < > 20   < > 58* 49* 42*   < > 18 21 18   CREATININE 1.21* 1.14* 1.01*   < > 1.21*   < >  2.63* 2.12* 2.04*   < > 1.8* 1.9* 1.9*  CALCIUM 8.2* 8.4* 8.4*   < > 7.7*   < > 8.0* 8.0* 7.9*   < > 8.2* 8.6* 8.6*  MG 2.4 2.4 2.4  --  1.8  --   --   --   --   --   --   --   --   PHOS 3.6 4.3 3.6  --   --   --   --   --   --   --   --   --   --    < > = values in this interval not displayed.   Recent Labs    07/05/22 0538 07/06/22 0702 07/21/22 0000 08/18/22 0000 10/13/22 1855 11/15/22 0000  AST 30 36   < > 22 20 19   ALT 21 25   < > 18 17 12   ALKPHOS 66 63   < > 85 85 84  BILITOT 0.6 0.3  --   --  1.1  --   PROT 6.3* 6.4*  --   --  7.0  --   ALBUMIN 2.2* 2.3*   < > 2.9* 2.7* 3.3*   < > = values in this interval not displayed.   Recent Labs    07/08/22 1714 07/08/22 2208 07/21/22 0000 10/13/22 1855 10/15/22 0349 10/16/22 1125 10/19/22 0339 11/15/22 0000  WBC 15.6*   < > 6.4   < > 14.8* 16.9* 11.9* 6.2  NEUTROABS 12.1*  --  4,230.00  --   --   --   --  3,379.00  HGB 11.7*   < > 11.7*   < > 11.5* 10.7* 11.5* 11.4*  HCT 38.5   < > 36   < > 36.6 34.9* 37.1 36  MCV 90.2   < >  --    < > 88.6 89.7 89.6  --   PLT 607*   < > 221   < > 156 159 261 322   < > = values in this interval not displayed.   Lab Results  Component Value Date   TSH 26.19 (A) 11/17/2022   No results found for: "HGBA1C" No results found for: "CHOL", "HDL", "LDLCALC", "LDLDIRECT", "TRIG", "CHOLHDL"  Significant Diagnostic Results in last 30 days:  No results found.  Assessment/Plan Acquired  hypothyroidism  increased Levothyroxine every day/46mcg and alternating, TSH 3.48 06/28/22<<24.34 11/15/22<<26.19 11/17/22, repeat TSH 2 months from 11/18/22  Hyponatremia Na 138 11/15/22  CKD (chronic kidney disease) stage 4, GFR 15-29 ml/min (HCC) Bun/creat 42/2.04 10/19/22, 21/1.86 11/09/22, 18/1.91 [address] f/u Nephrology  Chronic diastolic CHF (congestive heart failure) (HCC) CHF/Edema, dependent  areas, resumed Furosemide, off Spironolactone, BNP 98 11/15/22  GERD (gastroesophageal reflux disease)  stable, on  Pantoprazole, off  Omeprazole, prn Zofran. Hgb 11.4 11/15/22  Insomnia secondary to depression with anxiety    Insomnia/depression/anxiety, stable, off Mirtazapine.   Rheumatoid arthritis (HCC)  f/u Rheumatology, hx of Plaquenil use, stable presently.   Essential hypertension Blood pressure is controlled,  resume Furosemide, off Spironolactone.   Muscle spasm controlled, dc Gabapentin I setting of weight gain, prn Methocarbamol.   Blood loss anemia post op, s/p EGD no active bleed. 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 F/u Ortho. TDWB. Power wc. 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 Fully treated, then off antibiotics. 11/17/22 ID no infection, no tx, f/u wound care     Family/ staff Communication: plan of care reviewed with the patient and charge nurse.   Labs/tests ordered:  pending TSH  Time spend 35 minutes.

## 2022-11-22 NOTE — Assessment & Plan Note (Signed)
controlled, dc Gabapentin I setting of weight gain, prn Methocarbamol.

## 2022-11-22 NOTE — Assessment & Plan Note (Signed)
post op, s/p EGD no active bleed. S/p 6 u PRBC transfusion, ASA was dc'd, On Fe, Iron 15 in hospital, Hgb 11.4 11/15/22

## 2022-11-22 NOTE — Assessment & Plan Note (Signed)
increased Levothyroxine every day/68mcg and alternating, TSH 3.48 06/28/22<<24.34 11/15/22<<26.19 11/17/22, repeat TSH 2 months from 11/18/22

## 2022-11-22 NOTE — Assessment & Plan Note (Signed)
Na 138 11/15/22

## 2022-11-22 NOTE — Assessment & Plan Note (Signed)
Insomnia/depression/anxiety, stable, off Mirtazapine.

## 2022-11-22 NOTE — Assessment & Plan Note (Signed)
stable, on  Pantoprazole, off  Omeprazole, prn Zofran. Hgb 11.4 11/15/22

## 2022-11-29 LAB — BASIC METABOLIC PANEL
BUN: 24 — AB (ref 4–21)
CO2: 21 (ref 13–22)
Chloride: 110 — AB (ref 99–108)
Creatinine: 1.9 — AB (ref 0.5–1.1)
Glucose: 78
Potassium: 3.4 mEq/L — AB (ref 3.5–5.1)
Sodium: 139 (ref 137–147)

## 2022-11-30 ENCOUNTER — Encounter: Payer: Self-pay | Admitting: Nurse Practitioner

## 2022-11-30 ENCOUNTER — Non-Acute Institutional Stay (SKILLED_NURSING_FACILITY): Payer: Medicare Other | Admitting: Nurse Practitioner

## 2022-11-30 DIAGNOSIS — Z89621 Acquired absence of right hip joint: Secondary | ICD-10-CM

## 2022-11-30 DIAGNOSIS — N184 Chronic kidney disease, stage 4 (severe): Secondary | ICD-10-CM

## 2022-11-30 DIAGNOSIS — K2100 Gastro-esophageal reflux disease with esophagitis, without bleeding: Secondary | ICD-10-CM | POA: Diagnosis not present

## 2022-11-30 DIAGNOSIS — E039 Hypothyroidism, unspecified: Secondary | ICD-10-CM

## 2022-11-30 DIAGNOSIS — I5032 Chronic diastolic (congestive) heart failure: Secondary | ICD-10-CM | POA: Diagnosis not present

## 2022-11-30 DIAGNOSIS — I10 Essential (primary) hypertension: Secondary | ICD-10-CM

## 2022-11-30 DIAGNOSIS — D500 Iron deficiency anemia secondary to blood loss (chronic): Secondary | ICD-10-CM

## 2022-11-30 DIAGNOSIS — M62838 Other muscle spasm: Secondary | ICD-10-CM

## 2022-11-30 DIAGNOSIS — E871 Hypo-osmolality and hyponatremia: Secondary | ICD-10-CM

## 2022-11-30 NOTE — Assessment & Plan Note (Signed)
increased Levothyroxine every day/68mcg and alternating, TSH 3.48 06/28/22<<24.34 11/15/22<<26.19 11/17/22, repeat TSH 2 months from 11/18/22

## 2022-11-30 NOTE — Assessment & Plan Note (Signed)
Bun/creat 24/1.87 11/29/22 at her baseline, f/u Nephrology

## 2022-11-30 NOTE — Assessment & Plan Note (Signed)
post op, s/p EGD no active bleed. S/p 6 u PRBC transfusion, ASA was dc'd, On Fe, Iron 15 in hospital, Hgb 11.4 11/15/22

## 2022-11-30 NOTE — Assessment & Plan Note (Signed)
stable, on  Pantoprazole, off  Omeprazole, prn Zofran. Hgb 11.4 11/15/22

## 2022-11-30 NOTE — Progress Notes (Unsigned)
Location:   SNF [facility] Room Number: 66 Place of Service:  SNF (31) Provider: Arna Snipe  NP  Heffner, Jimmye Norman, MD  Patient Care Team: Heffner, Jimmye Norman, MD as PCP - General (Family Medicine) [name], MD as PCP - Cardiology (Cardiology) [name], MD as PCP - Sleep Medicine (Cardiology) [name], MD (Sports Medicine) [name], MD (Ophthalmology) [name], [name], MD (Dermatology) [name], MD (Gastroenterology) [name], MD (Nephrology) [name], DC as Referring Physician (Chiropractic Medicine)  Extended Emergency Contact Information Primary Emergency Contact: [facility] Address: [address]          [place], [place] [address] [place] Home Phone: [phone] Mobile Phone: [phone] Relation: Spouse  Code Status: DNR Goals of care: Advanced Directive information    11/22/2022   10:36 AM  Advanced Directives  Type of Advance Directive Living will;Out of facility DNR (pink MOST or yellow form)  Does patient want to make changes to medical advance directive? No - Patient declined  Pre-existing out of facility DNR order (yellow form or pink MOST form) Yellow form placed in chart (order not valid for inpatient use)     Chief Complaint  Patient presents with   Acute Visit    Recurred muscle spasm     HPI:  Pt is a 82 y.o. female seen today for an acute visit for       Hospitalized for AKI 10/13/22-10/19/22 for AKI, repeat BMP 2x/wk until creat back to baseline, may resume Furosemide              Hospitalized 07/08/22 for CHF, abscess of the right hip which previous infected, treated, and prosthesis was removed and never replaced. Open drainage site presents.               Hospitalized 07/02/22-07/07/22 for the right hip cellulitis, CT hip in hospital fluid collection unclear if it communicates with the joint space, Ortho not felt to be related to the joint space  and may be an old fluid collection, aspiration not recommended.              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 F/u Ortho. TDWB. Power wc. 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 Fully treated, then off antibiotics. 11/17/22 ID no infection, no tx, f/u wound care              Elevated AST/ALT normalized, S/p cholecystectomy, [place] 02/19/21 no cyst or mass.              Anemia, post op, s/p EGD no active bleed. S/p 6 u PRBC transfusion, ASA was dc'd, On Fe, Iron 15 in hospital, Hgb 11.4 11/15/22             Muscle spasm, recurred, left lower back, off Gabapentin, Methocarbamol has been effective.              HTN  resumed Furosemide, off Spironolactone.              Morbid obesity             OSA CPAP             Chronic diarrhea, prn Imodium, Align             RA f/u Rheumatology, hx of Plaquenil use, stable presently.  Insomnia/depression/anxiety, stable, off Mirtazapine.              GERD, stable, on  Pantoprazole, off  Omeprazole, prn Zofran. Hgb 11.4 11/15/22             CHF/Edema, dependent  areas, resumed Furosemide, off Spironolactone, BNP 98 11/15/22 CKD  Bun/creat 24/1.87 11/29/22 at her baseline, f/u Nephrology Hyponatremia, Na 139 11/29/22             Hypothyroidism, increased Levothyroxine every day/6mcg and alternating, TSH 3.48 06/28/22<<24.34 11/15/22<<26.19 11/17/22, repeat TSH 2 months from 11/18/22   Past Medical History:  Diagnosis Date   Benign hypertension with chronic kidney disease, stage III (HCC)    Overview:  Last Assessment & Plan:  Usually the patient is hypertensive. However today her blood pressure is 105 systolic. She has been feeling fatigued with some lightheadedness. Part of this may be related to her lower blood pressure. Her pressure may be improved with her decrease in salt and fluid intake. I've instructed her to put her lisinopril/hctz on hold. I've asked her to see her primary     Cardiac disease 03/10/2014   Chronic diarrhea 07/27/2015   Edema 07/27/2015   Ejection fraction 2004   Normal, echo,    Gastroesophageal reflux disease    GERD (gastroesophageal reflux disease)    Heart disease 03/10/2014   Hypertension    Left bundle branch block 09/01/2020   Obesity    Obesity (BMI 30-39.9) 09/25/2017   OSA (obstructive sleep apnea)    mild with AHI 6.75 - on CPAP   Preop cardiovascular exam 11/2010   Cardiac clearance for knee surgery    Sleep apnea 03/10/2014   Supraventricular tachycardia    Documented episode in the past, possibly reentrant tachycardia  Overview:  Overview:  Documented episode in the past, 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 It was possibly reentrant. She does well with diltiazem. No change in therapy.   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   Thyroid disease    Urinary incontinence    Past Surgical History:  Procedure Laterality Date   CATARACT EXTRACTION Left 8/11   CATARACT EXTRACTION Right 3/12   CHOLECYSTECTOMY  1994   Copsilotomy Laser Treatment Left 6/12   eye   ELBOW SURGERY  8/12   EYE SURGERY Left 09/2008   macular hole   EYE SURGERY Right 12/11   Lumbar Infusion  2011   MOLE REMOVAL  06/17/2015   TOTAL KNEE ARTHROPLASTY Left 6/11   TOTAL KNEE ARTHROPLASTY Right 06/13/2011    Allergies  Allergen Reactions   Keflex [Cephalexin] Diarrhea   Macrobid [Nitrofurantoin] Hives   Amoxicillin Rash    Allergies as of 11/30/2022       Reactions   Keflex [cephalexin] Diarrhea   Macrobid [nitrofurantoin] Hives   Amoxicillin Rash        Medication List        Accurate as of November 30, 2022 12:56 PM. If you have any questions, ask your nurse or doctor.          albuterol 108 (90 Base) MCG/ACT inhaler Commonly known as: VENTOLIN HFA Inhale 2 puffs into  the lungs every 6 (six) hours as needed for wheezing or shortness of breath.   bifidobacterium infantis capsule Take 1 capsule by mouth daily.   CALCIUM PLUS VITAMIN D3 PO Take  1 tablet by mouth daily. Vitamin D3 + Calcium 600   ferrous sulfate 325 (65 FE) MG EC tablet Take 325 mg by mouth as directed. Once A Day on Mon, Thu   furosemide 20 MG tablet Commonly known as: LASIX Take 20 mg by mouth daily.   ipratropium-albuterol 0.5-2.5 (3) MG/3ML Soln Commonly known as: DUONEB Inhale 3 mLs into the lungs every 8 (eight) hours as needed (wheezing).   levothyroxine 25 MCG tablet Commonly known as: SYNTHROID Give 1 tablet orally one time a day every 2 day(s); 2 tablets (50 mcg) one time a day every 2 day(s)   loperamide 2 MG capsule Commonly known as: IMODIUM Take 2 mg by mouth every 4 (four) hours as needed for diarrhea or loose stools.   nystatin powder Commonly known as: MYCOSTATIN/NYSTOP Apply 1 application  topically in the morning, at noon, in the evening, and at bedtime. To genital areas and inner thigh   nystatin cream Commonly known as: MYCOSTATIN Apply 1 Application topically at bedtime.   ondansetron 4 MG tablet Commonly known as: ZOFRAN Take 4 mg by mouth every 8 (eight) hours as needed for nausea or vomiting.   Protonix 40 MG tablet Generic drug: pantoprazole Take 40 mg by mouth daily.   triamcinolone cream 0.5 % Commonly known as: KENALOG Apply 1 Application topically at bedtime.   Vitamin D3 50 MCG (2000 UT) Tabs Take 2 tablets by mouth daily.        Review of Systems  Constitutional:  Negative for appetite change, fatigue, fever and unexpected weight change.  HENT:  Negative for congestion, sore throat and trouble swallowing.   Eyes:  Negative for visual disturbance.  Respiratory:  Negative for cough and shortness of breath.   Cardiovascular:  Positive for leg swelling.  Gastrointestinal:  Negative for constipation.  Genitourinary:   Negative for dysuria, frequency and urgency.       Urogenital itching upon urination w/o skin irritation or rash.  Musculoskeletal:  Positive for arthralgias and gait problem.       Lower back muscle spasm recurred  Skin:  Positive for wound. Negative for color change and rash.       currently drainage opening present.   Neurological:  Negative for speech difficulty and weakness.  Psychiatric/Behavioral:  Negative for confusion and sleep disturbance. The patient is not nervous/anxious.     Immunization History  Administered Date(s) Administered   Influenza Split 01/28/2008, 02/17/2009, 02/20/2012, 02/11/2013, 02/02/2016, 01/11/2017, 02/04/2019   Influenza, High Dose Seasonal PF 02/04/2015, 01/11/2017, 01/30/2018, 02/05/2020   Influenza-Unspecified 02/16/2022   Moderna SARS-COV2 Booster Vaccination 02/24/2022   Moderna Sars-Covid-2 Vaccination 05/09/2019, 05/27/2019, 03/03/2020, 09/04/2020, 11/04/2020   Pfizer Covid-19 Vaccine Bivalent Booster 51yrs & up 02/09/2021   Pneumococcal Conjugate-13 08/30/2013   Pneumococcal Polysaccharide-23 11/03/2005   RSV,unspecified 04/22/2022   Td 08/01/2001, 09/05/2019   Tdap 09/22/2010, 11/24/2020   Zoster Recombinant(Shingrix) 11/08/2016, 01/11/2017   Zoster, Live 11/04/2005, 11/08/2016, 01/11/2017   Zoster, Unspecified 01/11/2017   Pertinent  Health Maintenance Due  Topic Date Due   INFLUENZA VACCINE  11/24/2022   DEXA SCAN  Completed      04/15/2022   10:15 AM 04/27/2022   11:35 AM 05/31/2022    9:34 AM 07/26/2022    9:31 AM 10/21/2022    9:31 AM  Fall Risk  Falls in the past year? 0 0 0 0 0  Was there an injury with Fall? 0 0 0 0 0  Fall Risk Category Calculator 0 0 0  0 0  Fall Risk Category (Retired) Low Low     (RETIRED) Patient Fall Risk Level Moderate fall risk Moderate fall risk     Patient at Risk for Falls Due to History of fall(s) History of fall(s) History of fall(s)  No Fall Risks  Fall risk Follow up Falls evaluation completed  Falls evaluation completed Falls evaluation completed  Falls evaluation completed   Functional Status Survey:    Vitals:   11/30/22 1237  BP: 129/82  Pulse: 64  Resp: 18  Temp: 97.8 F (36.6 C)  SpO2: 95%  Weight: 281 lb 9.6 oz (127.7 kg)   Body mass index is 42.82 kg/m. Physical Exam Vitals and nursing note reviewed.  Constitutional:      Appearance: Normal appearance. She is obese.  HENT:     Head: Normocephalic and atraumatic.     Nose: Nose normal.     Mouth/Throat:     Mouth: Mucous membranes are moist.  Eyes:     Extraocular Movements: Extraocular movements intact.     Conjunctiva/sclera: Conjunctivae normal.     Pupils: Pupils are equal, round, and reactive to light.  Cardiovascular:     Rate and Rhythm: Normal rate and regular rhythm.     Heart sounds: No murmur heard. Pulmonary:     Effort: Pulmonary effort is normal.     Breath sounds: No wheezing or rales.  Abdominal:     General: Bowel sounds are normal.     Palpations: Abdomen is soft.     Tenderness: There is no abdominal tenderness.  Musculoskeletal:     Cervical back: Normal range of motion and neck supple.     Right lower leg: Edema present.     Left lower leg: Edema present.     Comments: R hip, prosthetic hip removed. Dependent edema moderate to severe. C/o muscle spasm in the lower back, mostly left sided.   Skin:    General: Skin is warm and dry.     Comments: Right hip surgical scar, right hip drainage site.  Edema mostly in the dependent areas, buttock, thighs, legs.   Neurological:     General: No focal deficit present.     Mental Status: She is alert and oriented to person, place, and time.     Gait: Gait abnormal.  Psychiatric:        Mood and Affect: Mood normal.        Behavior: Behavior normal.        Thought Content: Thought content normal.        Judgment: Judgment normal.     Labs reviewed: Recent Labs    07/04/22 0502 07/05/22 0538 07/06/22 1610 07/08/22 1714  07/12/22 9604 07/13/22 5409 10/17/22 0327 10/18/22 0338 10/19/22 0339 10/25/22 0000 11/03/22 0000 11/08/22 0000 11/15/22 0000  NA 134* 135 136   < > 133*   < > 132* 134* 134*   < > 138 138 138  K 4.2 3.7 3.4*   < > 3.0*   < > 3.9 3.9 3.7   < > 4.2 4.4 4.0  CL 104 106 109   < > 102   < > 103 106 107   < > 108 108 107  CO2 20* 19* 19*   < > 20*   < > 20* 21* 21*   < > 23* 23* 24*  GLUCOSE 96 94 106*   < > 113*   < > 99 91 88  --   --   --   --  BUN 25* 24* 20   < > 20   < > 58* 49* 42*   < > 18 21 18   CREATININE 1.21* 1.14* 1.01*   < > 1.21*   < > 2.63* 2.12* 2.04*   < > 1.8* 1.9* 1.9*  CALCIUM 8.2* 8.4* 8.4*   < > 7.7*   < > 8.0* 8.0* 7.9*   < > 8.2* 8.6* 8.6*  MG 2.4 2.4 2.4  --  1.8  --   --   --   --   --   --   --   --   PHOS 3.6 4.3 3.6  --   --   --   --   --   --   --   --   --   --    < > = values in this interval not displayed.   Recent Labs    07/05/22 0538 07/06/22 0702 07/21/22 0000 08/18/22 0000 10/13/22 1855 11/15/22 0000  AST 30 36   < > 22 20 19   ALT 21 25   < > 18 17 12   ALKPHOS 66 63   < > 85 85 84  BILITOT 0.6 0.3  --   --  1.1  --   PROT 6.3* 6.4*  --   --  7.0  --   ALBUMIN 2.2* 2.3*   < > 2.9* 2.7* 3.3*   < > = values in this interval not displayed.   Recent Labs    07/08/22 1714 07/08/22 2208 07/21/22 0000 10/13/22 1855 10/15/22 0349 10/16/22 1125 10/19/22 0339 11/15/22 0000  WBC 15.6*   < > 6.4   < > 14.8* 16.9* 11.9* 6.2  NEUTROABS 12.1*  --  4,230.00  --   --   --   --  3,379.00  HGB 11.7*   < > 11.7*   < > 11.5* 10.7* 11.5* 11.4*  HCT 38.5   < > 36   < > 36.6 34.9* 37.1 36  MCV 90.2   < >  --    < > 88.6 89.7 89.6  --   PLT 607*   < > 221   < > 156 159 261 322   < > = values in this interval not displayed.   Lab Results  Component Value Date   TSH 26.19 (A) 11/17/2022   No results found for: "HGBA1C" No results found for: "CHOL", "HDL", "LDLCALC", "LDLDIRECT", "TRIG", "CHOLHDL"  Significant Diagnostic Results in last 30 days:   No results found.  Assessment/Plan: Muscle spasm recurred, left lower back, off Gabapentin, Methocarbamol has been effective.   Essential hypertension Blood pressure is controlled, resumed Furosemide, off Spironolactone.   GERD (gastroesophageal reflux disease) stable, on  Pantoprazole, off  Omeprazole, prn Zofran. Hgb 11.4 11/15/22  Chronic diastolic CHF (congestive heart failure) (HCC) Compensated clinically, edema dependent  areas, resumed Furosemide, off Spironolactone, BNP 98 11/15/22  CKD (chronic kidney disease) stage 4, GFR 15-29 ml/min (HCC) Bun/creat 24/1.87 11/29/22 at her baseline, f/u Nephrology  Hyponatremia  Na 139 11/29/22  Acquired hypothyroidism  increased Levothyroxine every day/30mcg and alternating, TSH 3.48 06/28/22<<24.34 11/15/22<<26.19 11/17/22, repeat TSH 2 months from 11/18/22    Blood loss anemia post op, s/p EGD no active bleed. S/p 6 u PRBC transfusion, ASA was dc'd, On Fe, Iron 15 in hospital, Hgb 11.4 11/15/22  Absence of hip joint, right s/p right THR 2018, removal of hardware 12/29/20,  takes Tylenol. F/u Ortho. TDWB. Power wc. 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 Fully treated, then off antibiotics. 11/17/22 ID no infection, no tx, f/u wound care    Family/ staff Communication: plan of care reviewed with the patient and charge nurse.   Labs/tests ordered:  none  Time spend 35 minutes.

## 2022-11-30 NOTE — Assessment & Plan Note (Signed)
Blood pressure is controlled, resumed Furosemide, off Spironolactone.

## 2022-11-30 NOTE — Assessment & Plan Note (Signed)
Compensated clinically, edema dependent  areas, resumed Furosemide, off Spironolactone, BNP 98 11/15/22

## 2022-11-30 NOTE — Assessment & Plan Note (Signed)
Na 139 11/29/22

## 2022-11-30 NOTE — Assessment & Plan Note (Signed)
recurred, left lower back, off Gabapentin, Methocarbamol has been effective.

## 2022-11-30 NOTE — Assessment & Plan Note (Signed)
s/p right THR 2018, removal of hardware 12/29/20,  takes Tylenol. F/u Ortho. TDWB. Power wc. 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 Fully treated, then off antibiotics. 11/17/22 ID no infection, no tx, f/u wound care

## 2022-12-20 LAB — TSH: TSH: 2.74 (ref 0.41–5.90)

## 2022-12-20 LAB — BASIC METABOLIC PANEL: Potassium: 3.3 meq/L — AB (ref 3.5–5.1)

## 2023-01-05 ENCOUNTER — Non-Acute Institutional Stay (SKILLED_NURSING_FACILITY): Payer: Medicare Other | Admitting: Nurse Practitioner

## 2023-01-05 ENCOUNTER — Encounter: Payer: Self-pay | Admitting: Nurse Practitioner

## 2023-01-05 DIAGNOSIS — D500 Iron deficiency anemia secondary to blood loss (chronic): Secondary | ICD-10-CM

## 2023-01-05 DIAGNOSIS — M549 Dorsalgia, unspecified: Secondary | ICD-10-CM | POA: Diagnosis not present

## 2023-01-05 DIAGNOSIS — M069 Rheumatoid arthritis, unspecified: Secondary | ICD-10-CM

## 2023-01-05 DIAGNOSIS — Z89621 Acquired absence of right hip joint: Secondary | ICD-10-CM | POA: Diagnosis not present

## 2023-01-05 DIAGNOSIS — M62838 Other muscle spasm: Secondary | ICD-10-CM

## 2023-01-05 DIAGNOSIS — I10 Essential (primary) hypertension: Secondary | ICD-10-CM

## 2023-01-05 DIAGNOSIS — G8929 Other chronic pain: Secondary | ICD-10-CM

## 2023-01-05 DIAGNOSIS — N184 Chronic kidney disease, stage 4 (severe): Secondary | ICD-10-CM

## 2023-01-05 DIAGNOSIS — E039 Hypothyroidism, unspecified: Secondary | ICD-10-CM

## 2023-01-05 DIAGNOSIS — I5032 Chronic diastolic (congestive) heart failure: Secondary | ICD-10-CM | POA: Diagnosis not present

## 2023-01-05 DIAGNOSIS — M545 Low back pain, unspecified: Secondary | ICD-10-CM | POA: Insufficient documentation

## 2023-01-05 DIAGNOSIS — K2100 Gastro-esophageal reflux disease with esophagitis, without bleeding: Secondary | ICD-10-CM

## 2023-01-05 NOTE — Assessment & Plan Note (Signed)
Muscle spasm, left lower back, off Gabapentin, Methocarbamol has been effective.

## 2023-01-05 NOTE — Assessment & Plan Note (Signed)
Hospitalized 07/02/22-07/07/22 for the right hip cellulitis, CT hip in hospital fluid collection unclear if it communicates with the joint space, Ortho not felt to be related to the joint space and may be an old fluid collection, aspiration not recommended.              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 F/u Ortho. TDWB. Power wc. 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 Fully treated, then off antibiotics. 11/17/22 ID no infection, no tx, healed.

## 2023-01-05 NOTE — Assessment & Plan Note (Signed)
11/22/22 increased Levothyroxine every day/46mcg and alternating, TSH 3.48 06/28/22<<24.34 11/15/22<<26.19 11/17/22, repeat TSH 2 months from 11/18/22

## 2023-01-05 NOTE — Assessment & Plan Note (Signed)
stable, on  Pantoprazole, off  Omeprazole, prn Zofran. Hgb 11.4 11/15/22

## 2023-01-05 NOTE — Assessment & Plan Note (Signed)
post op, s/p EGD no active bleed. S/p 6 u PRBC transfusion, ASA was dc'd, On Fe, Iron 15 in hospital, Hgb 11.4 11/15/22

## 2023-01-05 NOTE — Assessment & Plan Note (Addendum)
01/05/23 X-ray thoracic and lumbar spine, failed Tylenol, try Norco 5/325mg  bid, obtain CBC/diff, CMP/eGFR, UA C/S  mid to lower back in back, consistently, with movement or sitting or in bed, Hx of muscle spasm, Robaxin was effective in the past. No noted injury, she is afebrile.  01/05/23 X-ray L spine: previous surgery L3-L4, multilevel DDD, T spine normal

## 2023-01-05 NOTE — Assessment & Plan Note (Signed)
Hospitalized for AKI 10/13/22-10/19/22 for AKI, repeat BMP 2x/wk until creat back to baseline,resumed Furosemide, Bun/creat 24/1.87 11/29/22.

## 2023-01-05 NOTE — Assessment & Plan Note (Signed)
f/u Rheumatology, hx of Plaquenil use, stable presently.  ?

## 2023-01-05 NOTE — Assessment & Plan Note (Signed)
resumed Furosemide, off Spironolactone.

## 2023-01-05 NOTE — Assessment & Plan Note (Signed)
dependent  areas, resumed Furosemide, off Spironolactone, BNP 98 7/23/2

## 2023-01-05 NOTE — Progress Notes (Signed)
Location:   S[facility] Room Number: 58 Place of Service:  SNF (31) Provider: Arna Snipe Meria Crilly NP  Heffner, Jimmye Norman, MD  Patient Care Team: Heffner, Jimmye Norman, MD as PCP - General (Family Medicine) [name], MD as PCP - Cardiology (Cardiology) [name], MD as PCP - Sleep Medicine (Cardiology) [name], MD (Sports Medicine) [name], MD (Ophthalmology) [name], [name], MD (Dermatology) [name], MD (Gastroenterology) [name], MD (Nephrology) [name], DC as Referring Physician (Chiropractic Medicine)  Extended Emergency Contact Information Primary Emergency Contact: [facility] Address: [address]          [place], [place] [address] [place] Home Phone: [phone] Mobile Phone: [phone] Relation: Spouse  Code Status: DNR Goals of care: Advanced Directive information    01/05/2023    3:33 PM  Advanced Directives  Type of Advance Directive Living will;Out of facility DNR (pink MOST or yellow form)  Does patient want to make changes to medical advance directive? No - Patient declined  Pre-existing out of facility DNR order (yellow form or pink MOST form) Yellow form placed in chart (order not valid for inpatient use)     Chief Complaint  Patient presents with   Acute Visit    back pain    HPI:  Pt is a 82 y.o. female seen today for an acute visit for mid to lower back in back, consistently, with movement or sitting or in bed, Hx of muscle spasm, Robaxin was effective in the past. No noted injury, she is afebrile.    Hospitalized for AKI 10/13/22-10/19/22 for AKI, repeat BMP 2x/wk until creat back to baseline,resumed Furosemide, Bun/creat 24/1.87 11/29/22.               Hospitalized 07/08/22 for CHF, abscess of the right hip which previous infected, treated, and prosthesis was removed and never replaced. Open drainage site presents.               Hospitalized  07/02/22-07/07/22 for the right hip cellulitis, CT hip in hospital fluid collection unclear if it communicates with the joint space, Ortho not felt to be related to the joint space and may be an old fluid collection, aspiration not recommended.              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 F/u Ortho. TDWB. Power wc. 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 Fully treated, then off antibiotics. 11/17/22 ID no infection, no tx, healed.               Elevated AST/ALT normalized, S/p cholecystectomy, [place] 02/19/21 no cyst or mass.              Anemia, post op, s/p EGD no active bleed. S/p 6 u PRBC transfusion, ASA was dc'd, On Fe, Iron 15 in hospital, Hgb 11.4 11/15/22             Muscle spasm, left lower back, off Gabapentin, Methocarbamol has been effective.              HTN  resumed Furosemide, off Spironolactone.              Morbid obesity             OSA CPAP             Chronic diarrhea, prn Imodium, Align  RA f/u Rheumatology, hx of Plaquenil use, stable presently.              Insomnia/depression/anxiety, stable, off Mirtazapine.              GERD, stable, on  Pantoprazole, off  Omeprazole, prn Zofran. Hgb 11.4 11/15/22             CHF/Edema, dependent  areas, resumed Furosemide, off Spironolactone, BNP 98 11/15/22 CKD  Bun/creat 24/1.87 11/29/22 at her baseline, f/u Nephrology Hyponatremia, Na 139 11/29/22             Hypothyroidism, 11/22/22 increased Levothyroxine every day/26mcg and alternating, TSH 3.48 06/28/22<<24.34 11/15/22<<26.19 11/17/22, repeat TSH 2 months from 11/18/22   Past Medical History:  Diagnosis Date   Benign hypertension with chronic kidney disease, stage III (HCC)    Overview:  Last Assessment & Plan:  Usually the patient is hypertensive. However today her blood pressure is 105 systolic. She has been feeling fatigued with some lightheadedness. Part of this may be related to her lower blood pressure.  Her pressure may be improved with her decrease in salt and fluid intake. I've instructed her to put her lisinopril/hctz on hold. 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 It was possibly reentrant. She does well with diltiazem. No change in therapy.   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   Thyroid disease    Urinary incontinence    Past Surgical History:  Procedure Laterality Date   CATARACT EXTRACTION Left 8/11   CATARACT EXTRACTION Right 3/12   CHOLECYSTECTOMY  1994   Copsilotomy Laser Treatment Left 6/12   eye   ELBOW SURGERY  8/12   EYE SURGERY Left 09/2008   macular hole   EYE SURGERY Right 12/11   Lumbar Infusion  2011   MOLE REMOVAL  06/17/2015   TOTAL KNEE ARTHROPLASTY Left 6/11   TOTAL KNEE ARTHROPLASTY Right 06/13/2011    Allergies  Allergen Reactions   Keflex [Cephalexin] Diarrhea   Macrobid [Nitrofurantoin] Hives   Amoxicillin Rash    Allergies as of 01/05/2023       Reactions   Keflex [cephalexin] Diarrhea   Macrobid [nitrofurantoin] Hives   Amoxicillin Rash        Medication List        Accurate as of January 05, 2023   4:12 PM. If you have any questions, ask your nurse or doctor.          acetaminophen 325 MG tablet Commonly known as: TYLENOL Take 650 mg by mouth every 4 (four) hours as needed for fever or moderate pain.  albuterol 108 (90 Base) MCG/ACT inhaler Commonly known as: VENTOLIN HFA Inhale 2 puffs into the lungs every 6 (six) hours as needed for wheezing or shortness of breath.   bifidobacterium infantis capsule Take 1 capsule by mouth daily.   CALCIUM PLUS VITAMIN D3 PO Take 1 tablet by mouth daily. Vitamin D3 + Calcium 600   ferrous sulfate 325 (65 FE) MG EC tablet Take 325 mg by mouth as directed. Once A Day on Mon, Thu   furosemide 20 MG tablet Commonly known as: LASIX Take 20 mg by mouth daily.   ipratropium-albuterol 0.5-2.5 (3) MG/3ML Soln Commonly known as: DUONEB Inhale 3 mLs into the lungs every 8 (eight) hours as needed (wheezing).   levothyroxine 25 MCG tablet Commonly known as: SYNTHROID Give 1 tablet orally one time a day every 2 day(s); 2 tablets (50 mcg) one time a day every 2 day(s)   loperamide 2 MG capsule Commonly known as: IMODIUM Take 2 mg by mouth every 4 (four) hours as needed for diarrhea or loose stools.   methocarbamol 750 MG tablet Commonly known as: ROBAXIN Take 750 mg by mouth every 6 (six) hours as needed for muscle spasms.   nystatin powder Commonly known as: MYCOSTATIN/NYSTOP Apply 1 application  topically in the morning, at noon, in the evening, and at bedtime. To genital areas and inner thigh   nystatin cream Commonly known as: MYCOSTATIN Apply 1 Application topically at bedtime.   ondansetron 4 MG tablet Commonly known as: ZOFRAN Take 4 mg by mouth every 8 (eight) hours as needed for nausea or vomiting.   Protonix 40 MG tablet Generic drug: pantoprazole Take 40 mg by mouth daily.   triamcinolone cream 0.5 % Commonly known as: KENALOG Apply 1 Application topically at bedtime.   Vitamin D3 50 MCG (2000 UT) Tabs Take 2  tablets by mouth daily.        Review of Systems  Constitutional:  Negative for appetite change, fatigue and fever.  HENT:  Negative for congestion, sore throat and trouble swallowing.   Eyes:  Negative for visual disturbance.  Respiratory:  Negative for cough and shortness of breath.   Cardiovascular:  Positive for leg swelling.  Gastrointestinal:  Negative for constipation.  Genitourinary:  Negative for dysuria, frequency and urgency.       Urogenital itching upon urination w/o skin irritation or rash.  Musculoskeletal:  Positive for arthralgias, back pain and gait problem.       Lower back muscle spasm recurred  Skin:  Negative for color change and rash.  Neurological:  Negative for speech difficulty and weakness.  Psychiatric/Behavioral:  Negative for confusion and sleep disturbance. The patient is not nervous/anxious.     Immunization History  Administered Date(s) Administered   Influenza Split 01/28/2008, 02/17/2009, 02/20/2012, 02/11/2013, 02/02/2016, 01/11/2017, 02/04/2019   Influenza, High Dose Seasonal PF 02/04/2015, 01/11/2017, 01/30/2018, 02/05/2020   Influenza-Unspecified 02/16/2022   Moderna SARS-COV2 Booster Vaccination 02/24/2022   Moderna Sars-Covid-2 Vaccination 05/09/2019, 05/27/2019, 03/03/2020, 09/04/2020, 11/04/2020   Pfizer Covid-19 Vaccine Bivalent Booster 31yrs & up 02/09/2021   Pneumococcal Conjugate-13 08/30/2013   Pneumococcal Polysaccharide-23 11/03/2005   RSV,unspecified 04/22/2022   Td 08/01/2001, 09/05/2019   Tdap 09/22/2010, 11/24/2020   Zoster Recombinant(Shingrix) 11/08/2016, 01/11/2017   Zoster, Live 11/04/2005, 11/08/2016, 01/11/2017   Zoster, Unspecified 01/11/2017   Pertinent  Health Maintenance Due  Topic Date Due   INFLUENZA VACCINE  11/24/2022   DEXA SCAN  Completed      04/15/2022   10:15 AM  04/27/2022   11:35 AM 05/31/2022    9:34 AM 07/26/2022    9:31 AM 10/21/2022    9:31 AM  Fall Risk  Falls in the past year? 0 0 0 0 0  Was  there an injury with Fall? 0 0 0 0 0  Fall Risk Category Calculator 0 0 0 0 0  Fall Risk Category (Retired) Low Low     (RETIRED) Patient Fall Risk Level Moderate fall risk Moderate fall risk     Patient at Risk for Falls Due to History of fall(s) History of fall(s) History of fall(s)  No Fall Risks  Fall risk Follow up Falls evaluation completed Falls evaluation completed Falls evaluation completed  Falls evaluation completed   Functional Status Survey:    Vitals:   01/05/23 1522  BP: 132/71  Pulse: 65  Resp: 17  Temp: (!) 97.5 F (36.4 C)  SpO2: 96%  Weight: 281 lb (127.5 kg)  Height: 5\' 8"  (1.727 m)   Body mass index is 42.73 kg/m. Physical Exam Vitals and nursing note reviewed.  Constitutional:      Appearance: Normal appearance. She is obese.  HENT:     Head: Normocephalic and atraumatic.     Nose: Nose normal.     Mouth/Throat:     Mouth: Mucous membranes are moist.  Eyes:     Extraocular Movements: Extraocular movements intact.     Conjunctiva/sclera: Conjunctivae normal.     Pupils: Pupils are equal, round, and reactive to light.  Cardiovascular:     Rate and Rhythm: Normal rate and regular rhythm.     Heart sounds: No murmur heard. Pulmonary:     Effort: Pulmonary effort is normal.     Breath sounds: No rales.  Abdominal:     General: Bowel sounds are normal.     Palpations: Abdomen is soft.     Tenderness: There is no abdominal tenderness.  Musculoskeletal:        General: Tenderness present.     Cervical back: Normal range of motion and neck supple.     Right lower leg: Edema present.     Left lower leg: Edema present.     Comments: R hip, prosthetic hip removed. Dependent edema moderate. C/o muscle spasm in the lower back, mostly left sided.  Mid to lower spinal pain/aches palpated, but no sinal spinous process tenderness noted.   Skin:    General: Skin is warm and dry.     Comments: Right hip surgical scar, right hip drainage site.  Edema mostly in  the dependent areas, buttock, thighs, legs.   Neurological:     General: No focal deficit present.     Mental Status: She is alert and oriented to person, place, and time.     Gait: Gait abnormal.  Psychiatric:        Mood and Affect: Mood normal.        Behavior: Behavior normal.        Thought Content: Thought content normal.        Judgment: Judgment normal.     Labs reviewed: Recent Labs    07/04/22 0502 07/05/22 0538 07/06/22 0702 07/08/22 1714 07/12/22 0437 07/13/22 0412 10/17/22 0327 10/18/22 0338 10/19/22 0339 10/25/22 0000 11/03/22 0000 11/08/22 0000 11/15/22 0000  NA 134* 135 136   < > 133*   < > 132* 134* 134*   < > 138 138 138  K 4.2 3.7 3.4*   < > 3.0*   < >  3.9 3.9 3.7   < > 4.2 4.4 4.0  CL 104 106 109   < > 102   < > 103 106 107   < > 108 108 107  CO2 20* 19* 19*   < > 20*   < > 20* 21* 21*   < > 23* 23* 24*  GLUCOSE 96 94 106*   < > 113*   < > 99 91 88  --   --   --   --   BUN 25* 24* 20   < > 20   < > 58* 49* 42*   < > 18 21 18   CREATININE 1.21* 1.14* 1.01*   < > 1.21*   < > 2.63* 2.12* 2.04*   < > 1.8* 1.9* 1.9*  CALCIUM 8.2* 8.4* 8.4*   < > 7.7*   < > 8.0* 8.0* 7.9*   < > 8.2* 8.6* 8.6*  MG 2.4 2.4 2.4  --  1.8  --   --   --   --   --   --   --   --   PHOS 3.6 4.3 3.6  --   --   --   --   --   --   --   --   --   --    < > = values in this interval not displayed.   Recent Labs    07/05/22 0538 07/06/22 0702 07/21/22 0000 08/18/22 0000 10/13/22 1855 11/15/22 0000  AST 30 36   < > 22 20 19   ALT 21 25   < > 18 17 12   ALKPHOS 66 63   < > 85 85 84  BILITOT 0.6 0.3  --   --  1.1  --   PROT 6.3* 6.4*  --   --  7.0  --   ALBUMIN 2.2* 2.3*   < > 2.9* 2.7* 3.3*   < > = values in this interval not displayed.   Recent Labs    07/08/22 1714 07/08/22 2208 07/21/22 0000 10/13/22 1855 10/15/22 0349 10/16/22 1125 10/19/22 0339 11/15/22 0000  WBC 15.6*   < > 6.4   < > 14.8* 16.9* 11.9* 6.2  NEUTROABS 12.1*  --  4,230.00  --   --   --   --  3,379.00   HGB 11.7*   < > 11.7*   < > 11.5* 10.7* 11.5* 11.4*  HCT 38.5   < > 36   < > 36.6 34.9* 37.1 36  MCV 90.2   < >  --    < > 88.6 89.7 89.6  --   PLT 607*   < > 221   < > 156 159 261 322   < > = values in this interval not displayed.   Lab Results  Component Value Date   TSH 26.19 (A) 11/17/2022   No results found for: "HGBA1C" No results found for: "CHOL", "HDL", "LDLCALC", "LDLDIRECT", "TRIG", "CHOLHDL"  Significant Diagnostic Results in last 30 days:  No results found.  Assessment/Plan: Back pain 01/05/23 X-ray thoracic and lumbar spine, failed Tylenol, try Norco 5/325mg  bid, obtain CBC/diff, CMP/eGFR, UA C/S  mid to lower back in back, consistently, with movement or sitting or in bed, Hx of muscle spasm, Robaxin was effective in the past. No noted injury, she is afebrile.   CKD (chronic kidney disease) stage 4, GFR 15-29 ml/min (HCC) Hospitalized for AKI 10/13/22-10/19/22 for AKI, repeat BMP 2x/wk until creat back to baseline,resumed  Furosemide, Bun/creat 24/1.87 11/29/22.   Chronic diastolic CHF (congestive heart failure) (HCC) dependent  areas, resumed Furosemide, off Spironolactone, BNP 98 7/23/2  Absence of hip joint, right Hospitalized 07/02/22-07/07/22 for the right hip cellulitis, CT hip in hospital fluid collection unclear if it communicates with the joint space, Ortho not felt to be related to the joint space and may be an old fluid collection, aspiration not recommended.              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 F/u Ortho. TDWB. Power wc. 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 Fully treated, then off antibiotics. 11/17/22 ID no infection, no tx, healed.   Blood loss anemia post op, s/p EGD no active bleed. S/p 6 u PRBC transfusion, ASA was dc'd, On Fe, Iron 15 in hospital, Hgb 11.4 11/15/22  Muscle spasm Muscle spasm, left lower back, off Gabapentin, Methocarbamol has been effective.   Essential  hypertension  resumed Furosemide, off Spironolactone.   Rheumatoid arthritis (HCC) f/u Rheumatology, hx of Plaquenil use, stable presently.  GERD (gastroesophageal reflux disease) stable, on  Pantoprazole, off  Omeprazole, prn Zofran. Hgb 11.4 11/15/22  Acquired hypothyroidism  11/22/22 increased Levothyroxine every day/61mcg and alternating, TSH 3.48 06/28/22<<24.34 11/15/22<<26.19 11/17/22, repeat TSH 2 months from 11/18/22     Family/ staff Communication: plan of care reviewed with the patient, the patient's husband, and charge nurse.   Labs/tests ordered:  X-ray T+L spine, CBC/diff, CMP/eGFR, UA C/S  Time spend 35 minutes.

## 2023-01-06 LAB — CBC AND DIFFERENTIAL
HCT: 36 (ref 36–46)
Hemoglobin: 11.3 — AB (ref 12.0–16.0)
Neutrophils Absolute: 3709
Platelets: 280 10*3/uL (ref 150–400)
WBC: 6.1

## 2023-01-06 LAB — CBC: RBC: 4.23 (ref 3.87–5.11)

## 2023-01-10 ENCOUNTER — Non-Acute Institutional Stay (SKILLED_NURSING_FACILITY): Payer: Medicare Other | Admitting: Nurse Practitioner

## 2023-01-10 ENCOUNTER — Other Ambulatory Visit: Payer: Self-pay

## 2023-01-10 ENCOUNTER — Observation Stay (HOSPITAL_COMMUNITY)
Admission: EM | Admit: 2023-01-10 | Discharge: 2023-01-12 | Disposition: A | Discharge status: Skilled Nursing Facility | Payer: Medicare Other | Attending: Internal Medicine | Admitting: Internal Medicine

## 2023-01-10 ENCOUNTER — Encounter (HOSPITAL_COMMUNITY): Payer: Self-pay | Admitting: Emergency Medicine

## 2023-01-10 ENCOUNTER — Emergency Department (HOSPITAL_COMMUNITY): Payer: Medicare Other

## 2023-01-10 ENCOUNTER — Encounter: Payer: Self-pay | Admitting: Nurse Practitioner

## 2023-01-10 DIAGNOSIS — E039 Hypothyroidism, unspecified: Secondary | ICD-10-CM

## 2023-01-10 DIAGNOSIS — K5792 Diverticulitis of intestine, part unspecified, without perforation or abscess without bleeding: Secondary | ICD-10-CM | POA: Diagnosis not present

## 2023-01-10 DIAGNOSIS — N390 Urinary tract infection, site not specified: Secondary | ICD-10-CM | POA: Diagnosis not present

## 2023-01-10 DIAGNOSIS — N3000 Acute cystitis without hematuria: Principal | ICD-10-CM

## 2023-01-10 DIAGNOSIS — Z96653 Presence of artificial knee joint, bilateral: Secondary | ICD-10-CM | POA: Diagnosis not present

## 2023-01-10 DIAGNOSIS — K2100 Gastro-esophageal reflux disease with esophagitis, without bleeding: Secondary | ICD-10-CM | POA: Diagnosis not present

## 2023-01-10 DIAGNOSIS — N184 Chronic kidney disease, stage 4 (severe): Secondary | ICD-10-CM

## 2023-01-10 DIAGNOSIS — I5020 Unspecified systolic (congestive) heart failure: Secondary | ICD-10-CM | POA: Diagnosis not present

## 2023-01-10 DIAGNOSIS — M549 Dorsalgia, unspecified: Secondary | ICD-10-CM | POA: Diagnosis not present

## 2023-01-10 DIAGNOSIS — Z87891 Personal history of nicotine dependence: Secondary | ICD-10-CM | POA: Insufficient documentation

## 2023-01-10 DIAGNOSIS — D631 Anemia in chronic kidney disease: Secondary | ICD-10-CM | POA: Insufficient documentation

## 2023-01-10 DIAGNOSIS — R1032 Left lower quadrant pain: Secondary | ICD-10-CM | POA: Diagnosis present

## 2023-01-10 DIAGNOSIS — B9620 Unspecified Escherichia coli [E. coli] as the cause of diseases classified elsewhere: Secondary | ICD-10-CM

## 2023-01-10 DIAGNOSIS — I5032 Chronic diastolic (congestive) heart failure: Secondary | ICD-10-CM

## 2023-01-10 DIAGNOSIS — Z89621 Acquired absence of right hip joint: Secondary | ICD-10-CM | POA: Diagnosis present

## 2023-01-10 DIAGNOSIS — I130 Hypertensive heart and chronic kidney disease with heart failure and stage 1 through stage 4 chronic kidney disease, or unspecified chronic kidney disease: Secondary | ICD-10-CM | POA: Insufficient documentation

## 2023-01-10 DIAGNOSIS — I10 Essential (primary) hypertension: Secondary | ICD-10-CM

## 2023-01-10 DIAGNOSIS — G8929 Other chronic pain: Secondary | ICD-10-CM

## 2023-01-10 LAB — COMPREHENSIVE METABOLIC PANEL
ALT: 17 U/L (ref 0–44)
AST: 21 U/L (ref 15–41)
Albumin: 3.3 — AB (ref 3.5–5.0)
Albumin: 3.5 g/dL (ref 3.5–5.0)
Alkaline Phosphatase: 87 U/L (ref 38–126)
Anion gap: 7 (ref 5–15)
BUN: 31 mg/dL — ABNORMAL HIGH (ref 8–23)
CO2: 21 mmol/L — ABNORMAL LOW (ref 22–32)
Calcium: 8.7 mg/dL — ABNORMAL LOW (ref 8.9–10.3)
Calcium: 8.8 (ref 8.7–10.7)
Chloride: 107 mmol/L (ref 98–111)
Creatinine, Ser: 2.1 mg/dL — ABNORMAL HIGH (ref 0.44–1.00)
GFR, Estimated: 23 mL/min — ABNORMAL LOW (ref >60)
Globulin: 3
Glucose, Bld: 105 mg/dL — ABNORMAL HIGH (ref 70–99)
Potassium: 3.8 mmol/L (ref 3.5–5.1)
Sodium: 135 mmol/L (ref 135–145)
Total Bilirubin: 0.6 mg/dL (ref 0.3–1.2)
Total Protein: 7.3 g/dL (ref 6.5–8.1)

## 2023-01-10 LAB — BASIC METABOLIC PANEL
BUN: 32 — AB (ref 4–21)
CO2: 21 (ref 13–22)
Chloride: 108 (ref 99–108)
Creatinine: 2.1 — AB (ref 0.5–1.1)
Glucose: 77
Potassium: 4.1 mEq/L (ref 3.5–5.1)
Sodium: 138 (ref 137–147)

## 2023-01-10 LAB — CBC AND DIFFERENTIAL
HCT: 33 — AB (ref 36–46)
Hemoglobin: 10.6 — AB (ref 12.0–16.0)
Neutrophils Absolute: 3168
Platelets: 244 10*3/uL (ref 150–400)
WBC: 6

## 2023-01-10 LAB — CBC WITH DIFFERENTIAL/PLATELET
Abs Immature Granulocytes: 0.03 10*3/uL (ref 0.00–0.07)
Basophils Absolute: 0 10*3/uL (ref 0.0–0.1)
Basophils Relative: 1 %
Eosinophils Absolute: 0.2 10*3/uL (ref 0.0–0.5)
Eosinophils Relative: 3 %
HCT: 39.4 % (ref 36.0–46.0)
Hemoglobin: 11.8 g/dL — ABNORMAL LOW (ref 12.0–15.0)
Immature Granulocytes: 1 %
Lymphocytes Relative: 30 %
Lymphs Abs: 1.9 10*3/uL (ref 0.7–4.0)
MCH: 26.8 pg (ref 26.0–34.0)
MCHC: 29.9 g/dL — ABNORMAL LOW (ref 30.0–36.0)
MCV: 89.5 fL (ref 80.0–100.0)
Monocytes Absolute: 0.6 10*3/uL (ref 0.1–1.0)
Monocytes Relative: 10 %
Neutro Abs: 3.6 10*3/uL (ref 1.7–7.7)
Neutrophils Relative %: 55 %
Platelets: 272 10*3/uL (ref 150–400)
RBC: 4.4 MIL/uL (ref 3.87–5.11)
RDW: 14.9 % (ref 11.5–15.5)
WBC: 6.5 10*3/uL (ref 4.0–10.5)
nRBC: 0 % (ref 0.0–0.2)

## 2023-01-10 LAB — LIPASE, BLOOD: Lipase: 24 U/L (ref 11–51)

## 2023-01-10 LAB — URINALYSIS, W/ REFLEX TO CULTURE (INFECTION SUSPECTED)
Bilirubin Urine: NEGATIVE
Glucose, UA: NEGATIVE mg/dL
Hgb urine dipstick: NEGATIVE
Ketones, ur: NEGATIVE mg/dL
Nitrite: NEGATIVE
Protein, ur: NEGATIVE mg/dL
Specific Gravity, Urine: 1.004 — ABNORMAL LOW (ref 1.005–1.030)
pH: 6 (ref 5.0–8.0)

## 2023-01-10 LAB — HEPATIC FUNCTION PANEL
ALT: 10 U/L (ref 7–35)
AST: 15 (ref 13–35)
Alkaline Phosphatase: 83 (ref 25–125)
Bilirubin, Total: 0.4

## 2023-01-10 LAB — LACTIC ACID, PLASMA: Lactic Acid, Venous: 1 mmol/L (ref 0.5–1.9)

## 2023-01-10 LAB — TSH: TSH: 2.119 u[IU]/mL (ref 0.350–4.500)

## 2023-01-10 LAB — CBC: RBC: 3.92 (ref 3.87–5.11)

## 2023-01-10 MED ORDER — IPRATROPIUM-ALBUTEROL 0.5-2.5 (3) MG/3ML IN SOLN: 3.0000 mL | Freq: Three times a day (TID) | RESPIRATORY_TRACT | Status: DC | PRN

## 2023-01-10 MED ORDER — ONDANSETRON HCL 4 MG PO TABS: 4.0000 mg | ORAL_TABLET | Freq: Four times a day (QID) | ORAL | Status: DC | PRN

## 2023-01-10 MED ORDER — ONDANSETRON HCL 4 MG/2ML IJ SOLN: 4.0000 mg | Freq: Four times a day (QID) | INTRAMUSCULAR | Status: DC | PRN

## 2023-01-10 MED ORDER — FUROSEMIDE 20 MG PO TABS
20.0000 mg | ORAL_TABLET | Freq: Every day | ORAL | Status: DC
  Administered 2023-01-11 – 2023-01-12 (×2): 20 mg via ORAL
  Filled 2023-01-10 (×2): qty 1

## 2023-01-10 MED ORDER — PIPERACILLIN-TAZOBACTAM 3.375 G IVPB
3.3750 g | Freq: Three times a day (TID) | INTRAVENOUS | Status: DC
  Administered 2023-01-11: 3.375 g via INTRAVENOUS
  Filled 2023-01-10: qty 50

## 2023-01-10 MED ORDER — PIPERACILLIN-TAZOBACTAM 3.375 G IVPB
INTRAVENOUS | Status: AC
  Administered 2023-01-11: 3.375 g via INTRAVENOUS
  Filled 2023-01-10: qty 50

## 2023-01-10 MED ORDER — METHOCARBAMOL 500 MG PO TABS
750.0000 mg | ORAL_TABLET | Freq: Four times a day (QID) | ORAL | Status: DC | PRN
  Administered 2023-01-11: 750 mg via ORAL
  Filled 2023-01-10 (×2): qty 2

## 2023-01-10 MED ORDER — SODIUM CHLORIDE 0.9 % IV BOLUS
500.0000 mL | Freq: Once | INTRAVENOUS | Status: AC
  Administered 2023-01-10: 500 mL via INTRAVENOUS

## 2023-01-10 MED ORDER — SENNOSIDES-DOCUSATE SODIUM 8.6-50 MG PO TABS: 1.0000 | ORAL_TABLET | Freq: Every evening | ORAL | Status: DC | PRN

## 2023-01-10 MED ORDER — LEVOTHYROXINE SODIUM 100 MCG PO TABS
100.0000 ug | ORAL_TABLET | Freq: Every day | ORAL | Status: DC
  Administered 2023-01-11 – 2023-01-12 (×2): 100 ug via ORAL
  Filled 2023-01-10: qty 1
  Filled 2023-01-10: qty 4

## 2023-01-10 MED ORDER — HEPARIN SODIUM (PORCINE) 5000 UNIT/ML IJ SOLN
5000.0000 [IU] | Freq: Three times a day (TID) | INTRAMUSCULAR | Status: DC
  Administered 2023-01-11 – 2023-01-12 (×5): 5000 [IU] via SUBCUTANEOUS
  Filled 2023-01-10 (×5): qty 1

## 2023-01-10 MED ORDER — PANTOPRAZOLE SODIUM 40 MG PO TBEC
40.0000 mg | DELAYED_RELEASE_TABLET | Freq: Every day | ORAL | Status: DC
  Administered 2023-01-11 – 2023-01-12 (×2): 40 mg via ORAL
  Filled 2023-01-10 (×2): qty 1

## 2023-01-10 MED ORDER — ACETAMINOPHEN 325 MG PO TABS
650.0000 mg | ORAL_TABLET | Freq: Four times a day (QID) | ORAL | Status: DC | PRN
  Administered 2023-01-11: 650 mg via ORAL
  Filled 2023-01-10 (×2): qty 2

## 2023-01-10 MED ORDER — ACETAMINOPHEN 650 MG RE SUPP: 650.0000 mg | Freq: Four times a day (QID) | RECTAL | Status: DC | PRN

## 2023-01-10 MED ORDER — PIPERACILLIN-TAZOBACTAM 3.375 G IVPB 30 MIN
3.3750 g | Freq: Once | INTRAVENOUS | Status: AC
  Administered 2023-01-10: 3.375 g via INTRAVENOUS

## 2023-01-10 NOTE — Hospital Course (Signed)
Madison [name] is a 82 y.o. 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

## 2023-01-10 NOTE — Assessment & Plan Note (Signed)
11/22/22 increased Levothyroxine every day/58mcg and alternating, TSH 3.48 06/28/22<<24.34 11/15/22<<26.19 11/17/22, repeat TSH 2 months from 11/18/22

## 2023-01-10 NOTE — Assessment & Plan Note (Signed)
stable, on  Pantoprazole, off  Omeprazole, prn Zofran. Hgb 10.6 01/10/23

## 2023-01-10 NOTE — Assessment & Plan Note (Deleted)
Urine culture showed E Coli>100,000c/ml, susceptible to Cefepime, Deftazidime, Ceftriaxone, Gentamicin, Imipenem, Nitrofurantoin, Tobramycin. The patient has hx of allergic reaction to Cipro, Cephalexin, Nitrofurantoin, Amoxicillin. She also has CKD with creat 2s.  Hx of UTI, prosthetic joint infection and infection after hardware removed.  ED to eval and tx.

## 2023-01-10 NOTE — Assessment & Plan Note (Signed)
Bun/creat 32/2.11 01/10/23, pending Nephrology consultation.

## 2023-01-10 NOTE — Assessment & Plan Note (Signed)
dependent  areas, resumed Furosemide, off Spironolactone, BNP 98 11/15/22

## 2023-01-10 NOTE — Progress Notes (Signed)
Location:   SNF [facility] Room Number: 30 Place of Service:  SNF (31) Provider: Arna Snipe Feliz Herard NP  Heffner, Jimmye Norman, MD  Patient Care Team: Heffner, Jimmye Norman, MD as PCP - General (Family Medicine) [name], MD as PCP - Cardiology (Cardiology) [name], MD as PCP - Sleep Medicine (Cardiology) [name], MD (Sports Medicine) [name], MD (Ophthalmology) [name], [name], MD (Dermatology) [name], MD (Gastroenterology) [name], MD (Nephrology) [name], DC as Referring Physician (Chiropractic Medicine)  Extended Emergency Contact Information Primary Emergency Contact: [facility] Address: [address]          [place], [place] [address] [place] Home Phone: [phone] Mobile Phone: [phone] Relation: Spouse  Code Status: DNR Goals of care: Advanced Directive information    01/05/2023    3:33 PM  Advanced Directives  Type of Advance Directive Living will;Out of facility DNR (pink MOST or yellow form)  Does patient want to make changes to medical advance directive? No - Patient declined  Pre-existing out of facility DNR order (yellow form or pink MOST form) Yellow form placed in chart (order not valid for inpatient use)     Chief Complaint  Patient presents with   Acute Visit    UTI    HPI:  Pt is a 82 y.o. female seen today for an acute visit for UTI.   Urine culture showed E Coli>100,000c/ml, susceptible to Cefepime, Deftazidime, Ceftriaxone, Gentamicin, Imipenem, Nitrofurantoin, Tobramycin. The patient has hx of allergic reaction to Cipro, Cephalexin, Nitrofurantoin, Amoxicillin. She also has CKD with creat 2s. High risk for septic events.   Hx of UTI, prosthetic joint infection 2022, cellulitis/abscess of the right hip after hardware removed 06/2022  Mid to lower back in back, consistently, with movement or sitting or in bed, Hx of muscle spasm, Robaxin was  effective in the past. No noted injury, she is afebrile. Norco is effective.                Hospitalized for AKI 10/13/22-10/19/22 for AKI, resumed Furosemide, Bun/creat 32/2.11 01/10/23              Hospitalized 07/08/22 for CHF, abscess of the right hip which previous infected, treated, and prosthesis was removed and never replaced.               Hospitalized 07/02/22-07/07/22 for the right hip cellulitis, CT hip in hospital fluid collection unclear if it communicates with the joint space, Ortho not felt to be related to the joint space and may be an old fluid collection, aspiration not recommended.              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 F/u Ortho. TDWB. Power wc. 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 Fully treated, then off antibiotics. 11/17/22 ID no infection, no tx, healed.               Elevated AST/ALT normalized, S/p cholecystectomy, [place] 02/19/21 no cyst or mass.                Anemia, post op, s/p EGD no active bleed. S/p 6 u PRBC transfusion, ASA was dc'd, On Fe, Iron 15 in hospital, Hgb 11.4 11/15/22             Muscle spasm, left lower back, off Gabapentin, failed Methocarbamol, now taking Norco  HTN  resumed Furosemide, off Spironolactone.              Morbid obesity             OSA CPAP             Chronic diarrhea, prn Imodium, Align             RA f/u Rheumatology, hx of Plaquenil use, stable presently.              Insomnia/depression/anxiety, stable, off Mirtazapine.              GERD, stable, on  Pantoprazole, off  Omeprazole, prn Zofran. Hgb 10.6 01/10/23             CHF/Edema, dependent  areas, resumed Furosemide, off Spironolactone, BNP 98 11/15/22 CKD  Bun/creat 32/2.11 01/10/23, f/u Nephrology Hyponatremia, Na 138 01/10/23             Hypothyroidism, 11/22/22 increased Levothyroxine every day/58mcg and alternating, TSH 3.48 06/28/22<<24.34 11/15/22<<26.19 11/17/22, repeat TSH 2 months from  11/18/22    Past Medical History:  Diagnosis Date   Benign hypertension with chronic kidney disease, stage III (HCC)    Overview:  Last Assessment & Plan:  Usually the patient is hypertensive. However today her blood pressure is 105 systolic. She has been feeling fatigued with some lightheadedness. Part of this may be related to her lower blood pressure. Her pressure may be improved with her decrease in salt and fluid intake. I've instructed her to put her lisinopril/hctz on hold. 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 It was possibly reentrant. She does well with diltiazem. No change in therapy.   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   Thyroid disease    Urinary incontinence    Past Surgical History:  Procedure Laterality Date   CATARACT EXTRACTION Left 8/11   CATARACT EXTRACTION Right 3/12   CHOLECYSTECTOMY  1994   Copsilotomy Laser Treatment Left 6/12   eye   ELBOW SURGERY  8/12   EYE SURGERY Left 09/2008   macular hole   EYE SURGERY Right 12/11   Lumbar Infusion  2011   MOLE REMOVAL  06/17/2015   TOTAL KNEE ARTHROPLASTY Left 6/11   TOTAL KNEE  ARTHROPLASTY Right 06/13/2011    Allergies  Allergen Reactions   Keflex [Cephalexin] Diarrhea   Macrobid [Nitrofurantoin] Hives   Amoxicillin Rash    Allergies as of 01/10/2023       Reactions   Keflex [cephalexin] Diarrhea   Macrobid [nitrofurantoin] Hives   Amoxicillin Rash  Medication List        Accurate as of January 10, 2023  4:48 PM. If you have any questions, ask your nurse or doctor.          acetaminophen 325 MG tablet Commonly known as: TYLENOL Take 650 mg by mouth every 4 (four) hours as needed for fever or moderate pain.   albuterol 108 (90 Base) MCG/ACT inhaler Commonly known as: VENTOLIN HFA Inhale 2 puffs into the lungs every 6 (six) hours as needed for wheezing or shortness of breath.   bifidobacterium infantis capsule Take 1 capsule by mouth daily.   CALCIUM PLUS VITAMIN D3 PO Take 1 tablet by mouth daily. Vitamin D3 + Calcium 600   ferrous sulfate 325 (65 FE) MG EC tablet Take 325 mg by mouth as directed. Once A Day on Mon, Thu   furosemide 20 MG tablet Commonly known as: LASIX Take 20 mg by mouth daily.   ipratropium-albuterol 0.5-2.5 (3) MG/3ML Soln Commonly known as: DUONEB Inhale 3 mLs into the lungs every 8 (eight) hours as needed (wheezing).   levothyroxine 25 MCG tablet Commonly known as: SYNTHROID Give 1 tablet orally one time a day every 2 day(s); 2 tablets (50 mcg) one time a day every 2 day(s)   loperamide 2 MG capsule Commonly known as: IMODIUM Take 2 mg by mouth every 4 (four) hours as needed for diarrhea or loose stools.   methocarbamol 750 MG tablet Commonly known as: ROBAXIN Take 750 mg by mouth every 6 (six) hours as needed for muscle spasms.   nystatin powder Commonly known as: MYCOSTATIN/NYSTOP Apply 1 application  topically in the morning, at noon, in the evening, and at bedtime. To genital areas and inner thigh   nystatin cream Commonly known as: MYCOSTATIN Apply 1 Application topically at  bedtime.   ondansetron 4 MG tablet Commonly known as: ZOFRAN Take 4 mg by mouth every 8 (eight) hours as needed for nausea or vomiting.   Protonix 40 MG tablet Generic drug: pantoprazole Take 40 mg by mouth daily.   triamcinolone cream 0.5 % Commonly known as: KENALOG Apply 1 Application topically at bedtime.   Vitamin D3 50 MCG (2000 UT) Tabs Take 2 tablets by mouth daily.        Review of Systems  Constitutional:  Negative for appetite change, fatigue and fever.  HENT:  Negative for congestion, sore throat and trouble swallowing.   Eyes:  Negative for visual disturbance.  Respiratory:  Negative for cough and shortness of breath.   Cardiovascular:  Positive for leg swelling.  Gastrointestinal:  Negative for constipation.  Genitourinary:  Positive for frequency. Negative for dysuria and urgency.  Musculoskeletal:  Positive for arthralgias, back pain and gait problem.       Mid to lower back pain  Skin:  Negative for color change.  Neurological:  Negative for speech difficulty and weakness.  Psychiatric/Behavioral:  Negative for confusion and sleep disturbance. The patient is not nervous/anxious.     Immunization History  Administered Date(s) Administered   Influenza Split 01/28/2008, 02/17/2009, 02/20/2012, 02/11/2013, 02/02/2016, 01/11/2017, 02/04/2019   Influenza, High Dose Seasonal PF 02/04/2015, 01/11/2017, 01/30/2018, 02/05/2020   Influenza-Unspecified 02/16/2022   Moderna SARS-COV2 Booster Vaccination 02/24/2022   Moderna Sars-Covid-2 Vaccination 05/09/2019, 05/27/2019, 03/03/2020, 09/04/2020, 11/04/2020   Pfizer Covid-19 Vaccine Bivalent Booster 35yrs & up 02/09/2021   Pneumococcal Conjugate-13 08/30/2013   Pneumococcal Polysaccharide-23 11/03/2005   RSV,unspecified 04/22/2022   Td 08/01/2001, 09/05/2019   Tdap 09/22/2010, 11/24/2020   Zoster  Recombinant(Shingrix) 11/08/2016, 01/11/2017   Zoster, Live 11/04/2005, 11/08/2016, 01/11/2017   Zoster, Unspecified  01/11/2017   Pertinent  Health Maintenance Due  Topic Date Due   INFLUENZA VACCINE  11/24/2022   DEXA SCAN  Completed      04/15/2022   10:15 AM 04/27/2022   11:35 AM 05/31/2022    9:34 AM 07/26/2022    9:31 AM 10/21/2022    9:31 AM  Fall Risk  Falls in the past year? 0 0 0 0 0  Was there an injury with Fall? 0 0 0 0 0  Fall Risk Category Calculator 0 0 0 0 0  Fall Risk Category (Retired) Low Low     (RETIRED) Patient Fall Risk Level Moderate fall risk Moderate fall risk     Patient at Risk for Falls Due to History of fall(s) History of fall(s) History of fall(s)  No Fall Risks  Fall risk Follow up Falls evaluation completed Falls evaluation completed Falls evaluation completed  Falls evaluation completed   Functional Status Survey:    Vitals:   01/10/23 1630  BP: (!) 141/76  Pulse: 65  Resp: 17  Temp: (!) 97.5 F (36.4 C)  SpO2: 96%  Weight: 281 lb (127.5 kg)   Body mass index is 42.73 kg/m. Physical Exam Vitals and nursing note reviewed.  Constitutional:      Appearance: Normal appearance. She is obese.  HENT:     Head: Normocephalic and atraumatic.     Nose: Nose normal.     Mouth/Throat:     Mouth: Mucous membranes are moist.  Eyes:     Extraocular Movements: Extraocular movements intact.     Conjunctiva/sclera: Conjunctivae normal.     Pupils: Pupils are equal, round, and reactive to light.  Cardiovascular:     Rate and Rhythm: Normal rate and regular rhythm.     Heart sounds: No murmur heard. Pulmonary:     Effort: Pulmonary effort is normal.     Breath sounds: No rales.  Abdominal:     General: Bowel sounds are normal.     Palpations: Abdomen is soft.     Tenderness: There is no abdominal tenderness. There is no right CVA tenderness, left CVA tenderness, guarding or rebound.  Musculoskeletal:        General: Tenderness present.     Cervical back: Normal range of motion and neck supple.     Right lower leg: Edema present.     Left lower leg: Edema  present.     Comments: R hip, prosthetic hip removed. Dependent edema moderate. C/o muscle spasm in the lower back, mostly left sided.  Mid to lower spinal pain/aches palpated, but no sinal spinous process tenderness noted.   Skin:    General: Skin is warm and dry.     Comments: Right hip surgical scar.  Edema mostly in the dependent areas, buttock, thighs, legs.   Neurological:     General: No focal deficit present.     Mental Status: She is alert and oriented to person, place, and time.     Gait: Gait abnormal.  Psychiatric:        Mood and Affect: Mood normal.        Behavior: Behavior normal.        Thought Content: Thought content normal.        Judgment: Judgment normal.     Labs reviewed: Recent Labs    07/04/22 0502 07/05/22 0538 07/06/22 1610 07/08/22 1714 07/12/22 0437 07/13/22 0412 10/17/22  0327 10/18/22 0338 10/19/22 0339 10/25/22 0000 11/03/22 0000 11/08/22 0000 11/15/22 0000  NA 134* 135 136   < > 133*   < > 132* 134* 134*   < > 138 138 138  K 4.2 3.7 3.4*   < > 3.0*   < > 3.9 3.9 3.7   < > 4.2 4.4 4.0  CL 104 106 109   < > 102   < > 103 106 107   < > 108 108 107  CO2 20* 19* 19*   < > 20*   < > 20* 21* 21*   < > 23* 23* 24*  GLUCOSE 96 94 106*   < > 113*   < > 99 91 88  --   --   --   --   BUN 25* 24* 20   < > 20   < > 58* 49* 42*   < > 18 21 18   CREATININE 1.21* 1.14* 1.01*   < > 1.21*   < > 2.63* 2.12* 2.04*   < > 1.8* 1.9* 1.9*  CALCIUM 8.2* 8.4* 8.4*   < > 7.7*   < > 8.0* 8.0* 7.9*   < > 8.2* 8.6* 8.6*  MG 2.4 2.4 2.4  --  1.8  --   --   --   --   --   --   --   --   PHOS 3.6 4.3 3.6  --   --   --   --   --   --   --   --   --   --    < > = values in this interval not displayed.   Recent Labs    07/05/22 0538 07/06/22 0702 07/21/22 0000 08/18/22 0000 10/13/22 1855 11/15/22 0000  AST 30 36   < > 22 20 19   ALT 21 25   < > 18 17 12   ALKPHOS 66 63   < > 85 85 84  BILITOT 0.6 0.3  --   --  1.1  --   PROT 6.3* 6.4*  --   --  7.0  --   ALBUMIN  2.2* 2.3*   < > 2.9* 2.7* 3.3*   < > = values in this interval not displayed.   Recent Labs    07/08/22 1714 07/08/22 2208 07/21/22 0000 10/13/22 1855 10/15/22 0349 10/16/22 1125 10/19/22 0339 11/15/22 0000  WBC 15.6*   < > 6.4   < > 14.8* 16.9* 11.9* 6.2  NEUTROABS 12.1*  --  4,230.00  --   --   --   --  3,379.00  HGB 11.7*   < > 11.7*   < > 11.5* 10.7* 11.5* 11.4*  HCT 38.5   < > 36   < > 36.6 34.9* 37.1 36  MCV 90.2   < >  --    < > 88.6 89.7 89.6  --   PLT 607*   < > 221   < > 156 159 261 322   < > = values in this interval not displayed.   Lab Results  Component Value Date   TSH 26.19 (A) 11/17/2022   No results found for: "HGBA1C" No results found for: "CHOL", "HDL", "LDLCALC", "LDLDIRECT", "TRIG", "CHOLHDL"  Significant Diagnostic Results in last 30 days:  No results found.  Assessment/Plan: E-coli UTI Urine culture showed E Coli>100,000c/ml, susceptible to Cefepime, Deftazidime, Ceftriaxone, Gentamicin, Imipenem, Nitrofurantoin, Tobramycin. The patient has hx of allergic reaction to Cipro,  Cephalexin, Nitrofurantoin, Amoxicillin. She also has CKD with creat 2s. High risk for septic events.   Hx of UTI, prosthetic joint infection 2022, cellulitis/abscess of the right hip after hardware removed 06/2022  ED eval and tx.   Back pain Mid to lower back in back, consistently, with movement or sitting or in bed, Hx of muscle spasm, Robaxin was effective in the past. No noted injury, she is afebrile. Norco is effective.     01/05/23 Xray T spine no fx, L spine s/p surgery, DDD, no acute fx.   Essential hypertension Blood pressure is controlled, only on Furosemide.   GERD (gastroesophageal reflux disease) stable, on  Pantoprazole, off  Omeprazole, prn Zofran. Hgb 10.6 01/10/23  Chronic diastolic CHF (congestive heart failure) (HCC) dependent  areas, resumed Furosemide, off Spironolactone, BNP 98 11/15/22  CKD (chronic kidney disease) stage 4, GFR 15-29 ml/min  (HCC) Bun/creat 32/2.11 01/10/23, pending Nephrology consultation.    Family/ staff Communication: plan of care reviewed with the patient and charge nurse.   Labs/tests ordered:  none  Time spend 35 minutes.

## 2023-01-10 NOTE — Assessment & Plan Note (Addendum)
Mid to lower back in back, consistently, with movement or sitting or in bed, Hx of muscle spasm, Robaxin was effective in the past. No noted injury, she is afebrile. Norco is effective.     01/05/23 Xray T spine no fx, L spine s/p surgery, DDD, no acute fx.

## 2023-01-10 NOTE — ED Triage Notes (Signed)
Patient presents from [facility] due to staff suspicion of a UTI. She had a positive result for UTI today. Symptoms started about a week ago, but specific symptoms reported were unclear other than burning with urination. She has had frequent UTI's recently and the staff was concerned she would become septic quickly.     EMS vitals: 132/63 BP 70 HR 16 RR  99% SPO2 on room air 97.7 temp

## 2023-01-10 NOTE — Assessment & Plan Note (Signed)
Blood pressure is controlled,  only on Furosemide.

## 2023-01-10 NOTE — Progress Notes (Signed)
Pharmacy Antibiotic Note  Madison [name] is a 82 y.o. female admitted on 01/10/2023 with resistant UTI and diverticulitis.  Pharmacy has been consulted for zosyn dosing.  Plan: Zosyn 3.375g IV q8h (4 hour infusion). Follow renal function, cultures and clinical course    Temp (24hrs), Avg:97.5 F (36.4 C), Min:97.5 F (36.4 C), Max:97.6 F (36.4 C)  Recent Labs  Lab 01/10/23 1938 01/10/23 1958  WBC 6.5  --   CREATININE 2.10*  --   LATICACIDVEN  --  1.0    Estimated Creatinine Clearance: 29.1 mL/min (A) (by C-G formula based on SCr of 2.1 mg/dL (H)).    Allergies  Allergen Reactions   Keflex [Cephalexin] Diarrhea   Macrobid [Nitrofurantoin] Hives   Amoxicillin Rash    Antimicrobials this admission: 9/17 zosyn >>  Dose adjustments this admission:   Microbiology results: 9/17 BCx:  9/17 UCx:   Thank you for allowing pharmacy to be a part of this patient's care.  Arley Phenix RPh 01/10/2023, 10:17 PM

## 2023-01-10 NOTE — H&P (Addendum)
History and Physical    Madison [name] ZOX:[id number] DOB: 01-Mar-1941 DOA: 01/10/2023  PCP: Magdalene Patricia, MD  Patient coming from: [facility]  I have personally briefly reviewed patient's old medical records in [facility]  Chief Complaint: UTI  HPI: Madison [name] is a 82 y.o. 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  Patient states she developed left lower quadrant abdominal pain about 1 week ago.  She has not had any significant nausea or vomiting.  She was diagnosed with UTI at her facility.  Patient states that she did not have any urinary symptoms but typically does not have usual symptoms when she develops UTI.  She denies fevers, chills, diaphoresis.  She is nonambulatory due to removal of previous infected right hip prosthesis but will mobilize in electric wheelchair at her facility.  She has chronic RLE swelling which is unchanged from baseline and improves with elevation when in bed.  ED Course  Labs/Imaging on admission: I have personally reviewed following labs and imaging studies.  Initial vitals showed BP 107/50, pulse 68, RR 19, temp 97.6 F, SpO2 99% on room air.  Labs show WBC 6.5, hemoglobin 11.8, platelets 272,000, sodium 135, potassium 3.8, bicarb 21, BUN 31, creatinine 2.10, serum glucose 105, lipase 24, lactic acid 1.0.  UA shows negative nitrites, small leukocytes, 0-5 RBCs and WBCs, rare bacteria.  Blood cultures and urine culture in process.  CT abdomen/pelvis without contrast showed diffuse left-sided colonic diverticulosis with faint stranding changes along the distal third of the descending colon.  A mild diverticulitis is favored.  No diverticular abscess or free air.  No urinary  stone or obstruction.  Cortical thinning in both kidneys with small right kidney and low normal left kidney.  No bladder thickening.  Patient was given 500 cc normal saline and started on IV Zosyn per pharmacy recommendation.  The hospitalist service was consulted to admit for further evaluation and management.  Review of Systems: All systems reviewed and are negative except as documented in history of present illness above.   Past Medical History:  Diagnosis Date   Benign hypertension with chronic kidney disease, stage III (HCC)    Overview:  Last Assessment & Plan:  Usually the patient is hypertensive. However today her blood pressure is 105 systolic. She has been feeling fatigued with some lightheadedness. Part of this may be related to her lower blood pressure. Her pressure may be improved with her decrease in salt and fluid intake. I've instructed her to put her lisinopril/hctz on hold. 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 It was possibly reentrant. She does well with diltiazem. No change in therapy.   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   Thyroid  disease    Urinary incontinence     Past Surgical History:  Procedure Laterality Date   CATARACT EXTRACTION Left 8/11   CATARACT EXTRACTION Right 3/12   CHOLECYSTECTOMY  1994   Copsilotomy Laser Treatment Left 6/12   eye   ELBOW SURGERY  8/12   EYE SURGERY Left 09/2008   macular hole   EYE SURGERY Right 12/11   Lumbar Infusion  2011   MOLE REMOVAL  06/17/2015   TOTAL KNEE ARTHROPLASTY Left 6/11   TOTAL KNEE ARTHROPLASTY Right 06/13/2011    Social History:  reports that she has quit smoking. She has never been exposed to tobacco smoke. She has never used smokeless tobacco. She reports that she does not drink alcohol and does not use drugs.  Allergies  Allergen Reactions   Keflex [Cephalexin] Diarrhea   Macrobid [Nitrofurantoin] Hives   Amoxicillin Rash    Family History  Problem Relation Age of Onset   High blood pressure Mother    Diabetes Mother    Heart Problems Father    Diabetes Father    Prostate cancer Father    Heart attack Father    Heart attack Paternal Grandmother    Heart attack Maternal Grandfather    Heart attack Paternal Uncle    Stroke Neg Hx      Prior to Admission medications   Medication Sig Start Date End Date Taking? Authorizing Provider  acetaminophen (TYLENOL) 325 MG tablet Take 650 mg by mouth every 4 (four) hours as needed for fever or moderate pain.    [provider]  albuterol (VENTOLIN HFA) 108 (90 Base) MCG/ACT inhaler Inhale 2 puffs into the lungs every 6 (six) hours as needed for wheezing or shortness of breath.    [provider]  bifidobacterium infantis (ALIGN) capsule Take 1 capsule by mouth daily.    [provider]  Calcium Carb-Cholecalciferol (CALCIUM PLUS VITAMIN D3 PO) Take 1 tablet by mouth daily. Vitamin D3 + Calcium 600    [provider]  Cholecalciferol (VITAMIN D3) 50 MCG (2000 UT) TABS Take 2 tablets by mouth daily.    [provider]  ferrous sulfate 325 (65 FE) MG  EC tablet Take 325 mg by mouth as directed. Once A Day on Mon, Thu    [provider]  furosemide (LASIX) 20 MG tablet Take 20 mg by mouth daily.    [provider]  ipratropium-albuterol (DUONEB) 0.5-2.5 (3) MG/3ML SOLN Inhale 3 mLs into the lungs every 8 (eight) hours as needed (wheezing).    [provider]  levothyroxine (SYNTHROID) 25 MCG tablet Give 1 tablet orally one time a day every 2 day(s); 2 tablets (50 mcg) one time a day every 2 day(s)    [provider]  loperamide (IMODIUM) 2 MG capsule Take 2 mg by mouth every 4 (four) hours as needed for diarrhea or loose stools. 02/04/21   [provider]  methocarbamol (ROBAXIN) 750 MG tablet Take 750 mg by mouth every 6 (six) hours as needed for muscle spasms.    [provider]  nystatin (MYCOSTATIN/NYSTOP) powder Apply 1 application  topically in the morning,  at noon, in the evening, and at bedtime. To genital areas and inner thigh    [provider]  nystatin cream (MYCOSTATIN) Apply 1 Application topically at bedtime.    [provider]  ondansetron (ZOFRAN) 4 MG tablet Take 4 mg by mouth every 8 (eight) hours as needed for nausea or vomiting.    [provider]  pantoprazole (PROTONIX) 40 MG tablet Take 40 mg by mouth daily.    [provider]  triamcinolone cream (KENALOG) 0.5 % Apply 1 Application topically at bedtime.    [provider]    Physical Exam: Vitals:   01/10/23 1802 01/10/23 1822 01/10/23 2200 01/10/23 2230  BP: (!) 142/50 (!) 107/50 (!) 155/86   Pulse: 68 68 69   Resp: 18 19 13    Temp: (!) 97.5 F (36.4 C) 97.6 F (36.4 C)  98.1 F (36.7 C)  TempSrc: Oral Oral  Oral  SpO2: 100% 99% 99%    Constitutional: Chronically ill-appearing obese woman resting supine in bed.  NAD, calm, comfortable Eyes: EOMI, lids and conjunctivae normal ENMT: Mucous membranes are moist. Posterior pharynx clear of any exudate or lesions.Normal  dentition.  Neck: normal, supple, no masses. Respiratory: clear to auscultation anteriorly. Normal respiratory effort. No accessory muscle use.  Cardiovascular: Regular rate and rhythm, no murmurs / rubs / gallops.  +1 right lower extremity edema, no edema LLE. 2+ pedal pulses. Abdomen: no tenderness, no masses palpated.  Musculoskeletal: no clubbing / cyanosis.  ROM diminished right hip due to absence of hip Skin: Well-healed right hip surgical wound.  No rashes, lesions, ulcers. No induration Neurologic: Sensation intact. Strength 5/5 upper extremities, diminished RLE due to absence of right hip. Psychiatric: Normal judgment and insight. Alert and oriented x 3. Normal mood.   EKG: Personally reviewed. Sinus rhythm, rate 69, LBBB.  Last EKG June 2024 showed sinus rhythm, rate 84, RBBB.  LBBB was noted on previous EKG May 2021.  Assessment/Plan Principal Problem:   Acute diverticulitis Active Problems:   UTI (urinary tract infection)   Chronic heart failure with preserved ejection fraction (HFpEF) (HCC)   CKD (chronic kidney disease) stage 4, GFR 15-29 ml/min (HCC)   Acquired hypothyroidism   Absence of hip joint, right   Madison [name] is a 82 y.o. 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  Assessment and Plan: Acute diverticulitis: Appears mild on CT imaging without evidence of abscess or free air.  Abdominal pain improved. -Continue IV Zosyn per pharmacy recommendation -Advance diet as tolerated -Hopefully can transition to oral antibiotics and discharge back to SNF tomorrow  Urinary tract infection: Diagnosed at her outside facility, documentation from SNF "Urine culture showed E Coli>100,000c/ml, susceptible to Cefepime, Deftazidime, Ceftriaxone,  Gentamicin, Imipenem, Nitrofurantoin, Tobramycin."Repeat urine culture in the ED is in process. -On IV Zosyn as above  CKD stage IV: Creatinine 2.10 on admission.  Appears to have progressed to CKD stage IV since June.  Follow-up with nephrology as an outpatient.  Chronic HFpEF: Last EF 70-75% by TTE 07/09/2022.  Stable.  Continue oral Lasix 20 mg daily.  Hypothyroidism: Continue home dose Synthroid.  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  Fall precautions.  Right hip wound site appears to have healed well.  DVT prophylaxis: heparin injection 5,000 Units Start: 01/11/23 0600 Code Status: Full code, confirmed with patient.  ACP documents reviewed, patient has DNR form on file but patient again confirms wishes for full CODE STATUS. Family Communication: Discussed with patient, she has discussed with family Disposition Plan: From SNF and likely return to same facility pending clinical progress Consults called: None Severity of Illness: The appropriate patient status for this patient is OBSERVATION. Observation status is judged to be reasonable and necessary in order to provide the required intensity of service to ensure the patient's safety. The patient's presenting symptoms, physical exam findings, and initial radiographic and laboratory data in the context of their medical condition is felt to place them at decreased risk for further clinical deterioration. Furthermore, it is anticipated that the patient will be medically stable for discharge from the hospital within 2 midnights of admission.   Darreld Mclean MD [facility]  If 7PM-7AM, please contact night-coverage [email or web address]  01/10/2023, 11:11 PM

## 2023-01-10 NOTE — ED Provider Notes (Signed)
[place] [facility]ERGENCY DEPARTMENT AT [facility] Provider Note   CSN: [id number] Arrival date & time: 01/10/23  1752     History  Chief Complaint  Patient presents with   Dysuria    Madison [name] is a 82 y.o. female.  The history is provided by the patient and medical records. No language interpreter was used.  Dysuria Pain quality:  Unable to specify Pain severity:  Unable to specify Onset quality:  Unable to specify Progression:  Resolved Chronicity:  Recurrent Worsened by:  Nothing Ineffective treatments:  None tried Associated symptoms: abdominal pain and flank pain   Associated symptoms: no fever, no nausea and no vomiting   Risk factors: renal disease        Home Medications Prior to Admission medications   Medication Sig Start Date End Date Taking? Authorizing Provider  acetaminophen (TYLENOL) 325 MG tablet Take 650 mg by mouth every 4 (four) hours as needed for fever or moderate pain.    [provider]  albuterol (VENTOLIN HFA) 108 (90 Base) MCG/ACT inhaler Inhale 2 puffs into the lungs every 6 (six) hours as needed for wheezing or shortness of breath.    [provider]  bifidobacterium infantis (ALIGN) capsule Take 1 capsule by mouth daily.    [provider]  Calcium Carb-Cholecalciferol (CALCIUM PLUS VITAMIN D3 PO) Take 1 tablet by mouth daily. Vitamin D3 + Calcium 600    [provider]  Cholecalciferol (VITAMIN D3) 50 MCG (2000 UT) TABS Take 2 tablets by mouth daily.    [provider]  ferrous sulfate 325 (65 FE) MG EC tablet Take 325 mg by mouth as directed. Once A Day on Mon, Thu    [provider]  furosemide (LASIX) 20 MG tablet Take 20 mg by mouth daily.    [provider]  ipratropium-albuterol (DUONEB) 0.5-2.5 (3) MG/3ML SOLN Inhale 3 mLs into the lungs every 8 (eight) hours as needed (wheezing).    [provider]  levothyroxine (SYNTHROID) 25 MCG tablet Give 1  tablet orally one time a day every 2 day(s); 2 tablets (50 mcg) one time a day every 2 day(s)    [provider]  loperamide (IMODIUM) 2 MG capsule Take 2 mg by mouth every 4 (four) hours as needed for diarrhea or loose stools. 02/04/21   [provider]  methocarbamol (ROBAXIN) 750 MG tablet Take 750 mg by mouth every 6 (six) hours as needed for muscle spasms.    [provider]  nystatin (MYCOSTATIN/NYSTOP) powder Apply 1 application  topically in the morning, at noon, in the evening, and at bedtime. To genital areas and inner thigh    [provider]  nystatin cream (MYCOSTATIN) Apply 1 Application topically at bedtime.    [provider]  ondansetron (ZOFRAN) 4 MG tablet Take 4 mg by mouth every 8 (eight) hours as needed for nausea or vomiting.    [provider]  pantoprazole (PROTONIX) 40 MG tablet Take 40 mg by mouth daily.    [provider]  triamcinolone cream (KENALOG) 0.5 % Apply 1 Application topically at bedtime.    [provider]      Allergies    Keflex [cephalexin], Macrobid [nitrofurantoin], and Amoxicillin    Review of Systems   Review of Systems  Constitutional:  Positive for chills and fatigue. Negative for diaphoresis and fever.  HENT:  Negative for congestion.   Respiratory:  Negative for cough, chest tightness, shortness of breath  and wheezing.   Cardiovascular:  Negative for chest pain and palpitations.  Gastrointestinal:  Positive for abdominal pain. Negative for constipation, diarrhea, nausea and vomiting.  Genitourinary:  Positive for dysuria and flank pain. Negative for frequency.  Musculoskeletal:  Positive for back pain. Negative for neck pain and neck stiffness.  Skin:  Negative for rash and wound.  Neurological:  Negative for headaches.  Psychiatric/Behavioral:  Negative for agitation and confusion.   All other systems reviewed and are negative.   Physical Exam Updated Vital  Signs BP (!) 107/50 (BP Location: Right Arm)   Pulse 68   Temp 97.6 F (36.4 C) (Oral)   Resp 19   SpO2 99%  Physical Exam Vitals and nursing note reviewed.  Constitutional:      General: She is not in acute distress.    Appearance: She is well-developed. She is not ill-appearing, toxic-appearing or diaphoretic.  HENT:     Head: Normocephalic and atraumatic.     Nose: No congestion or rhinorrhea.     Mouth/Throat:     Mouth: Mucous membranes are dry.     Pharynx: No oropharyngeal exudate or posterior oropharyngeal erythema.  Eyes:     Extraocular Movements: Extraocular movements intact.     Conjunctiva/sclera: Conjunctivae normal.     Pupils: Pupils are equal, round, and reactive to light.  Cardiovascular:     Rate and Rhythm: Normal rate and regular rhythm.     Pulses: Normal pulses.     Heart sounds: No murmur heard. Pulmonary:     Effort: Pulmonary effort is normal. No respiratory distress.     Breath sounds: Normal breath sounds. No wheezing, rhonchi or rales.  Chest:     Chest wall: No tenderness.  Abdominal:     General: Abdomen is flat.     Palpations: Abdomen is soft.     Tenderness: There is no abdominal tenderness. There is no right CVA tenderness, left CVA tenderness, guarding or rebound.  Musculoskeletal:        General: No swelling or tenderness.     Cervical back: Neck supple. No tenderness.     Right lower leg: No edema.     Left lower leg: No edema.  Skin:    General: Skin is warm and dry.     Capillary Refill: Capillary refill takes less than 2 seconds.     Findings: No erythema, lesion or rash.  Neurological:     General: No focal deficit present.     Mental Status: She is alert.  Psychiatric:        Mood and Affect: Mood normal.     ED Results / Procedures / Treatments   Labs (all labs ordered are listed, but only abnormal results are displayed) Labs Reviewed  CBC WITH DIFFERENTIAL/PLATELET - Abnormal; Notable for the following components:       Result Value   Hemoglobin 11.8 (*)    MCHC 29.9 (*)    All other components within normal limits  COMPREHENSIVE METABOLIC PANEL - Abnormal; Notable for the following components:   CO2 21 (*)    Glucose, Bld 105 (*)    BUN 31 (*)    Creatinine, Ser 2.10 (*)    Calcium 8.7 (*)    GFR, Estimated 23 (*)    All other components within normal limits  URINALYSIS, W/ REFLEX TO CULTURE (INFECTION SUSPECTED) - Abnormal; Notable for the following components:   Color, Urine STRAW (*)    Specific Gravity, Urine 1.004 (*)  Leukocytes,Ua SMALL (*)    Bacteria, UA RARE (*)    All other components within normal limits  CULTURE, BLOOD (ROUTINE X 2)  CULTURE, BLOOD (ROUTINE X 2)  URINE CULTURE  LACTIC ACID, PLASMA  LIPASE, BLOOD  TSH  CBC  BASIC METABOLIC PANEL    EKG EKG Interpretation Date/Time:  Tuesday January 10 2023 19:32:50 EDT Ventricular Rate:  69 PR Interval:  205 QRS Duration:  165 QT Interval:  456 QTC Calculation: 489 R Axis:   44  Text Interpretation: Sinus rhythm Left bundle branch block when compared to prior, 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 No STEMI Confirmed by Theda Belfast [id number]) on 01/10/2023 7:53:01 PM  Radiology CT ABDOMEN PELVIS WO CONTRAST  Result Date: 01/10/2023 CLINICAL DATA:  Left lower quadrant pain. UTI detected on outside urinalysis but she also has a history of diverticulitis. EXAM: CT ABDOMEN AND PELVIS WITHOUT CONTRAST TECHNIQUE: Multidetector CT imaging of the abdomen and pelvis was performed following the standard protocol without IV contrast. RADIATION DOSE REDUCTION: This exam was performed according to the departmental dose-optimization program which includes automated exposure control, adjustment of the mA and/or kV according to patient size and/or use of iterative reconstruction technique. COMPARISON:  Limited comparison is available with a CTA chest 07/02/2022 and postmyelogram CT lumbar spine dated 04/06/2020. There is no prior abdominopelvic dedicated  imaging. FINDINGS: Lower chest: Lung bases demonstrate scattered linear scarring or atelectasis without infiltrates. Interval resolution of the small pleural effusions noted on 07/02/2022. A moderate-sized hiatal hernia is again shown. The cardiac size is normal. There are calcifications in the right coronary artery. No pericardial fluid. Hepatobiliary: The liver is 21 cm length and mildly steatotic. No mass is seen without contrast. Gallbladder is absent and there is no biliary dilatation. Pancreas: The gland is moderately atrophic. No masses seen without contrast or ductal dilatation. Spleen: Unremarkable without contrast.  No splenomegaly. Adrenals/Urinary Tract: No adrenal mass. Cortical thinning noted somewhat in both kidneys. Small length measurement of the right kidney of 7.4 cm with low-normal length measurement on the left at 9 cm. There is no contour deforming abnormality of either kidney. There is no urinary stone or obstruction. Small bilateral extrarenal pelves are again shown. There is no thickening of the bladder wall. Stomach/Bowel: Moderate-sized hiatal hernia. No dilatation or wall thickening including the appendix. There is diffuse diverticulosis distal transverse and left-sided colon. There are faint stranding changes along side the distal third of the descending colon which would either represent a mild uncomplicated diverticulitis or fat scarring from prior diverticular disease. A mild diverticulitis is favored in this case. There is no diverticular abscess or free air. Vascular/Lymphatic: Aortic atherosclerosis. No enlarged abdominal or pelvic lymph nodes. Reproductive: Uterus and bilateral adnexa are unremarkable. Other: No free fluid, free air, free hemorrhage or incarcerated hernia. Small umbilical fat hernia. Musculoskeletal: Old spinous process fusion hardware L3-4 and L4-5 with dorsal decompression at L5-S1 and transitional anatomy with partial lumbarization of S1. Multilevel  degenerative discs with vacuum phenomenon. Spondylosis. There is chronic wedging of L1 and 3, chronic and unchanged minimal retrolisthesis at L1-2, minimal anterolisthesis at L3-4, L4-5 and L5-S1, with multilevel acquired foraminal stenosis. Comparison is also made to the right hip CT from 07/11/2022. Redemonstration of removal of hardware from the prior right hip arthroplasty is again noted, similar appearance of fluid and granulation tissue in the right hip bed with again noted cephalad migration of the right femoral shaft into the area. There is healed fracture deformity along the right acetabular  roof with acetabular remodeling. All of these findings were present previously and unchanged. No destructive osseous lesion is evident or new abnormality. There is chronic atrophy in the dorsal paraspinous muscles and right iliopsoas and gluteal musculature. IMPRESSION: 1. Diffuse left-sided colonic diverticulosis, with faint stranding changes alongside the distal third of the descending colon which would either represent a mild uncomplicated diverticulitis or fat scarring from prior diverticular disease. A mild diverticulitis is favored in this case. No diverticular abscess or free air. 2. No urinary stone or obstruction. 3. Cortical thinning in both kidneys with small right kidney and low-normal left kidney length. No urinary stone or obstruction. No bladder thickening. 4. Aortic and coronary artery atherosclerosis. 5. Moderate-sized hiatal hernia. 6. Mildly prominent liver with mild steatosis. 7. Small umbilical fat hernia. 8. Chronic changes in the right hip with removal of hardware from the prior right hip arthroplasty, and right-sided muscular atrophy described above. 9. Degenerative and chronic postsurgical changes lumbar spine with osteopenia and chronic wedging of L1 and 3. Aortic Atherosclerosis (ICD10-I70.0). Electronically Signed   By: Almira Bar M.D.   On: 01/10/2023 21:40    Procedures Procedures     CRITICAL CARE Performed by: Canary Brim Nelson Noone Total critical care time: 30 minutes Critical care time was exclusive of separately billable procedures and treating other patients. Critical care was necessary to treat or prevent imminent or life-threatening deterioration. Critical care was time spent personally by me on the following activities: development of treatment plan with patient and/or surrogate as well as nursing, discussions with consultants, evaluation of patient's response to treatment, examination of patient, obtaining history from patient or surrogate, ordering and performing treatments and interventions, ordering and review of laboratory studies, ordering and review of radiographic studies, pulse oximetry and re-evaluation of patient's condition.  Medications Ordered in ED Medications  piperacillin-tazobactam (ZOSYN) 3.375 GM/50ML IVPB (has no administration in time range)  piperacillin-tazobactam (ZOSYN) IVPB 3.375 g (has no administration in time range)  heparin injection 5,000 Units (has no administration in time range)  acetaminophen (TYLENOL) tablet 650 mg (has no administration in time range)    Or  acetaminophen (TYLENOL) suppository 650 mg (has no administration in time range)  ondansetron (ZOFRAN) tablet 4 mg (has no administration in time range)    Or  ondansetron (ZOFRAN) injection 4 mg (has no administration in time range)  senna-docusate (Senokot-S) tablet 1 tablet (has no administration in time range)  furosemide (LASIX) tablet 20 mg (has no administration in time range)  levothyroxine (SYNTHROID) tablet 25-50 mcg (has no administration in time range)  ipratropium-albuterol (DUONEB) 0.5-2.5 (3) MG/3ML nebulizer solution 3 mL (has no administration in time range)  methocarbamol (ROBAXIN) tablet 750 mg (has no administration in time range)  pantoprazole (PROTONIX) EC tablet 40 mg (has no administration in time range)  sodium chloride 0.9 % bolus 500 mL (500  mLs Intravenous Bolus 01/10/23 2102)  piperacillin-tazobactam (ZOSYN) IVPB 3.375 g (3.375 g Intravenous New Bag/Given 01/10/23 2229)    ED Course/ Medical Decision Making/ A&P                                 Medical Decision Making Amount and/or Complexity of Data Reviewed Labs: ordered. Radiology: ordered.  Risk Prescription drug management. Decision regarding hospitalization.    Madison [name] is a 82 y.o. female with a past medical history significant for hypertension, CKD, hypothyroidism, CHF, report of previous diverticulitis, SVT, GERD, previous cholecystectomy,  and history of resistant UTI who presents with concern for urinary tract infection with resistances to antibiotics and admission due to her intolerances and history.  According to patient, she has been having some bilateral back soreness that she thought was muscle pains but has also been having some pain in her left flank and left lower abdomen for the last few days.  She reports the pain has improved today but they did a urinalysis and it was currently for urinary tract infection.  She had a culture that showed it was resistant to certain medications she can tolerate and susceptible to ones that she could not.  She reports that she has been septic and very sick in the past and they sent her here for IV antibiotics and admission given her concern for developing sepsis.  When I first evaluated patient, blood pressure was 107/50 with a MAP of 66 initially.  Patient does report she had left lower abdominal pain and flank pain over the last few days but it is mild to moderate and is currently gone.  She reports it was up to a 7 out of 10 in severity at its worst.  She reports no constipation or diarrhea and is still passing gas.  She denies dysuria now but documentation says that she was having dysuria earlier.  She denies other urinary changes.  She denies any fevers but does have some chills.  She denies any chest pain or shortness of  breath but has had more fatigue.  She denies any headache, neck pain, or neck stiffness.  On exam, lungs clear.  Chest nontender.  Abdomen nontender but her left lower abdomen was the area she reports he was having the pain previously.  No flank or focal back tenderness.  No midline tenderness.  No focal neurologic deficits initially.  I took clinical photos of the culture.  This was from the note today: Urine culture showed E Coli>100,000c/ml, susceptible to Cefepime, Deftazidime, Ceftriaxone, Gentamicin, Imipenem, Nitrofurantoin, Tobramycin. The patient has hx of allergic reaction to Cipro, Cephalexin, Nitrofurantoin, Amoxicillin. She also has CKD with creat 2s. High risk for septic events. "  Given the patient's report of intermittent abdominal pain and her reported history of diverticulitis, will get CT scan to look for diverticulitis or pyelonephritis or abscess or other complication.  Will get repeat urinalysis and labs and give her a small amount of fluids.  She does not have significant edema, doubt fluid overload initially.  Blood pressure soft.  Anticipate discussion with pharmacy and likely admission for IV antibiotics once workup is completed.  9:41 PM Spoke with pharmacy and they recommended either cefepime and Flagyl versus just Zosyn.  Will wait for the CT to have results and then will call for admission for antibiotic management of UTI.  9:46 PM CT scan does show mild evidence of diverticulitis.  Will order Zosyn per pharmacy recommendations and admit for further management.         Final Clinical Impression(s) / ED Diagnoses Final diagnoses:  Acute cystitis without hematuria  Diverticulitis     Clinical Impression: 1. Acute cystitis without hematuria   2. Diverticulitis     Disposition: Admit  This note was prepared with assistance of Dragon voice recognition software. Occasional wrong-word or sound-a-like substitutions may have occurred due to the inherent  limitations of voice recognition software.      Parissa Chiao, Canary Brim, MD 01/10/23 [phone]

## 2023-01-10 NOTE — Assessment & Plan Note (Addendum)
Urine culture showed E Coli>100,000c/ml, susceptible to Cefepime, Deftazidime, Ceftriaxone, Gentamicin, Imipenem, Nitrofurantoin, Tobramycin. The patient has hx of allergic reaction to Cipro, Cephalexin, Nitrofurantoin, Amoxicillin. She also has CKD with creat 2s. High risk for septic events.   Hx of UTI, prosthetic joint infection 2022, cellulitis/abscess of the right hip after hardware removed 06/2022  ED eval and tx.

## 2023-01-11 ENCOUNTER — Encounter (HOSPITAL_COMMUNITY): Payer: Self-pay | Admitting: Internal Medicine

## 2023-01-11 DIAGNOSIS — K5792 Diverticulitis of intestine, part unspecified, without perforation or abscess without bleeding: Secondary | ICD-10-CM | POA: Diagnosis not present

## 2023-01-11 MED ORDER — METRONIDAZOLE 500 MG PO TABS
500.0000 mg | ORAL_TABLET | Freq: Two times a day (BID) | ORAL | Status: DC
  Administered 2023-01-11 – 2023-01-12 (×3): 500 mg via ORAL
  Filled 2023-01-11 (×4): qty 1

## 2023-01-11 MED ORDER — DIPHENHYDRAMINE HCL 12.5 MG/5ML PO ELIX
25.0000 mg | ORAL_SOLUTION | Freq: Four times a day (QID) | ORAL | Status: DC | PRN
  Administered 2023-01-11 – 2023-01-12 (×3): 25 mg via ORAL
  Filled 2023-01-11 (×4): qty 10

## 2023-01-11 MED ORDER — POTASSIUM CHLORIDE CRYS ER 20 MEQ PO TBCR
40.0000 meq | EXTENDED_RELEASE_TABLET | Freq: Once | ORAL | Status: AC
  Administered 2023-01-11: 40 meq via ORAL
  Filled 2023-01-11: qty 2

## 2023-01-11 MED ORDER — CEFADROXIL 500 MG PO CAPS: 500.0000 mg | ORAL_CAPSULE | Freq: Two times a day (BID) | ORAL | 0 refills | Status: DC

## 2023-01-11 MED ORDER — METRONIDAZOLE 500 MG PO TABS: 500.0000 mg | ORAL_TABLET | Freq: Two times a day (BID) | ORAL | 0 refills | Status: DC

## 2023-01-11 MED ORDER — CEFADROXIL 500 MG PO CAPS
500.0000 mg | ORAL_CAPSULE | Freq: Two times a day (BID) | ORAL | Status: DC
  Administered 2023-01-11: 500 mg via ORAL
  Filled 2023-01-11 (×3): qty 1

## 2023-01-11 NOTE — [place] FL2 (Signed)
[place] MEDICAID FL2 LEVEL OF CARE FORM     IDENTIFICATION  Patient Name: Madison [name] Birthdate: 1940-06-07 Sex: female Admission Date (Current Location): 01/10/2023  [facility] and [place] Number:  Producer, television/film/video and Address:  [facility],  [address] [place], [place] [address]      Provider Number: [id number]  Attending Physician Name and Address:  Elease Etienne, MD  Relative Name and Phone Number:  RANEA, RENEGAR (Spouse)  [phone] (Home Phone    Current Level of Care: Hospital Recommended Level of Care: Skilled Nursing Facility Prior Approval Number:    Date Approved/Denied:   PASRR Number: [id number]  Discharge Plan: SNF    Current Diagnoses: Patient Active Problem List   Diagnosis Date Noted   Acute diverticulitis 01/10/2023   Back pain 01/05/2023   Chronic wound 11/17/2022   AKI (acute kidney injury) (HCC) 10/14/2022   Acute kidney injury (HCC) 10/13/2022   Acute kidney injury superimposed on chronic kidney disease (HCC) 10/13/2022   Muscle spasm 09/05/2022   Medication management 07/27/2022   Cellulitis of left thigh 07/27/2022   Cutaneous abscess of right hip 07/21/2022   Hyponatremia 07/21/2022   Vaginal bleeding 07/20/2022   Multifocal pneumonia 07/08/2022   Leukocytosis 07/08/2022   Acute on chronic diastolic CHF (congestive heart failure) (HCC) 07/08/2022   Chronic anemia 07/08/2022   Infected abrasion of right hip, initial encounter 07/03/2022   Cellulitis of right hip 07/03/2022   Atypical chest pain 07/03/2022   Chronic heart failure with preserved ejection fraction (HFpEF) (HCC) 07/03/2022   Weight gain 06/27/2022   Malaise and fatigue 06/27/2022   COVID-19 virus infection 05/16/2022   Ingrown left greater toenail 04/12/2022   Dysuria 04/12/2022   Absence of hip joint, 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   Gait disorder 01/27/2022   Pressure ulcer, stage 2 (HCC) 07/12/2021   Candidiasis of skin 04/13/2021   UTI (urinary tract  infection) 04/02/2021   Hypokalemia 02/26/2021   Pressure ulcer of thigh, stage 2 (HCC) 02/19/2021   Elevated liver enzymes 02/16/2021   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   Right hip pain 02/05/2021   Sepsis (HCC) 02/05/2021   Blood loss anemia 02/05/2021   Rheumatoid arthritis (HCC) 02/05/2021   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   Acquired hypothyroidism 02/05/2021   Drug rash 02/05/2021   CKD (chronic kidney disease) stage 4, GFR 15-29 ml/min (HCC) 01/30/2019   Astigmatism with presbyopia, bilateral 01/22/2019   Blepharitis of both upper and lower eyelid 01/22/2019   Dermatochalasis of both upper eyelids 01/22/2019   Dry eyes, bilateral 01/22/2019   Epiretinal membrane (ERM) of both eyes 01/22/2019   Meibomian gland dysfunction (MGD) of both eyes 01/22/2019   Pseudophakia of both eyes 01/22/2019   Vitreous syneresis of both eyes 01/22/2019   Acute cystitis without hematuria 11/01/2018   E-coli UTI 06/28/2018   Malodorous urine 06/28/2018   Class 3 obesity (HCC) 09/25/2017   Obstructive sleep apnea    Nasal sore 10/07/2016   Metabolic bone disease 09/01/2015   Vitamin D deficiency 09/01/2015   Chronic diarrhea 07/27/2015   Edema of lower extremity 11/11/2014   Cardiac disease 03/10/2014   Heart disease 03/10/2014   Encounter for preprocedural cardiovascular examination    Essential hypertension    GERD (gastroesophageal reflux disease)    Adiposity    Supraventricular tachycardia (HCC)    Decreased cardiac output    Urinary incontinence     Orientation RESPIRATION BLADDER Height & Weight  Self, Time, Situation, Place  Normal Incontinent Weight:   Height:     BEHAVIORAL SYMPTOMS/MOOD NEUROLOGICAL BOWEL NUTRITION STATUS      Continent Diet  AMBULATORY STATUS COMMUNICATION OF NEEDS Skin   Extensive Assist Verbally Normal                       Personal Care Assistance Level of Assistance  Bathing, Feeding, Dressing  Bathing Assistance: Maximum assistance Feeding assistance: Limited assistance Dressing Assistance: Maximum assistance     Functional Limitations Info  Sight, Hearing, Speech Sight Info: Adequate Hearing Info: Adequate Speech Info: Adequate    SPECIAL CARE FACTORS FREQUENCY  PT (By licensed PT), OT (By licensed OT)     PT Frequency: 5X per week OT Frequency: 5X per week            Contractures Contractures Info: Not present    Additional Factors Info  Allergies   Allergies Info: Keflex (Cephalexin)  Diarrhea Not Specified  10/24/2020    Macrobid (Nitrofurantoin)  Hives Not Specified  10/24/2020    Amoxicillin  Rash Low  04/20/2022           Current Medications (01/11/2023):  This is the current hospital active medication list Current Facility-Administered Medications  Medication Dose Route Frequency Provider Last Rate Last Admin   acetaminophen (TYLENOL) tablet 650 mg  650 mg Oral Q6H PRN Charlsie Quest, MD   650 mg at 01/11/23 1159   Or   acetaminophen (TYLENOL) suppository 650 mg  650 mg Rectal Q6H PRN Charlsie Quest, MD       cefadroxil (DURICEF) capsule 500 mg  500 mg Oral BID Marcellus Scott D, MD   500 mg at 01/11/23 1425   furosemide (LASIX) tablet 20 mg  20 mg Oral Daily Darreld Mclean R, MD   20 mg at 01/11/23 1049   heparin injection 5,000 Units  5,000 Units Subcutaneous Q8H Darreld Mclean R, MD   5,000 Units at 01/11/23 1547   ipratropium-albuterol (DUONEB) 0.5-2.5 (3) MG/3ML nebulizer solution 3 mL  3 mL Inhalation Q8H PRN Charlsie Quest, MD       levothyroxine (SYNTHROID) tablet 100 mcg  100 mcg Oral Q0600 Darreld Mclean R, MD   100 mcg at 01/11/23 0615   methocarbamol (ROBAXIN) tablet 750 mg  750 mg Oral Q6H PRN Darreld Mclean R, MD   750 mg at 01/11/23 1158   metroNIDAZOLE (FLAGYL) tablet 500 mg  500 mg Oral Q12H Hongalgi, Anand D, MD   500 mg at 01/11/23 1158   ondansetron (ZOFRAN) tablet 4 mg  4 mg Oral Q6H PRN Charlsie Quest, MD       Or   ondansetron  (ZOFRAN) injection 4 mg  4 mg Intravenous Q6H PRN Charlsie Quest, MD       pantoprazole (PROTONIX) EC tablet 40 mg  40 mg Oral Daily Darreld Mclean R, MD   40 mg at 01/11/23 1049   senna-docusate (Senokot-S) tablet 1 tablet  1 tablet Oral QHS PRN Charlsie Quest, MD         Discharge Medications: Please see discharge summary for a list of discharge medications.  Relevant Imaging Results:  Relevant Lab Results:   Additional Information    Harriett Sine, RN

## 2023-01-11 NOTE — TOC Transition Note (Signed)
Transition of Care [facility]) - CM/SW Discharge Note   Patient Details  Name: Madison [name] MRN: [id number] Date of Birth: August 15, 1940  Transition of Care [facility]) CM/SW Contact:  Harriett Sine, RN Phone Number:[phone]  01/11/2023, 4:32 PM   Clinical Narrative:    Pt returning to [facility]. Spoke with [facility] and completed new FL2. PTARR scheduled for transport. Dorene Sorrow Goodenow called for update left message.    Final next level of care: Skilled Nursing Facility Barriers to Discharge: No Barriers Identified   Patient Goals and CMS Choice   Choice offered to / list presented to : NA  Discharge Placement                    Name of family member notified: Analize Liz Patient and family notified of of transfer: 01/11/23  Discharge Plan and Services Additional resources added to the After Visit Summary for                                       Social Determinants of Health (SDOH) Interventions SDOH Screenings   Food Insecurity: No Food Insecurity (01/11/2023)  Housing: Low Risk  (01/11/2023)  Transportation Needs: No Transportation Needs (01/11/2023)  Utilities: Not At Risk (01/11/2023)  Depression (PHQ2-9): Low Risk  (07/26/2022)  Financial Resource Strain: Low Risk  (01/16/2021)   Received from [facility], [facility]  Social Connections: Unknown (08/28/2021)   Received from [facility], [facility]  Stress: No Stress Concern Present (01/13/2021)   Received from [facility], [facility]  Tobacco Use: Medium Risk (01/11/2023)     Readmission Risk Interventions    10/17/2022   12:09 PM  Readmission Risk Prevention Plan  Transportation Screening Complete  Home Care Screening Complete  Medication Review (RN CM) Complete

## 2023-01-11 NOTE — Discharge Summary (Addendum)
Physician Discharge Summary  Madison [name]:[id number] DOB: 11/14/1940  PCP: Magdalene Patricia, MD  Admitted from: SNF Discharged to: Return to prior SNF [facility])  Admit date: 01/10/2023 Discharge date: 01/11/2023  Recommendations for Outpatient Follow-up:    Follow-up Information     MD at SNF Follow up.   Why: To be seen in 2 to 3 days with repeat labs (CBC & BMP).  Patient to keep up upcoming nephrology appointment next Monday.        Heffner, Jimmye Norman, MD. Schedule an appointment as soon as possible for a visit.   Specialty: Family Medicine        Bufford Buttner, MD Follow up on 01/16/2023.   Specialty: Nephrology Why: As per patient's spouse, patient has an upcoming appointment with nephrology next Monday. Contact information: [address] [place] [address] [phone]               MD at SNF to address the following: Patient on multiple topical antifungal creams/powder and on dual vitamin D supplements.  Kindly evaluate and discontinue those that are not indicated. Kindly also follow-up final urine culture sensitivity results that were sent from the hospital and address them as deemed appropriate.   Home Health: None.    Equipment/Devices: TBD at SNF.    Discharge Condition: Improved and stable.   Code Status: Limited: Do not attempt resuscitation (DNR) -DNR-LIMITED -Do Not Intubate/DNI  Confirmed this with patient's spouse.  Patient has an out of facility valid DNR form. Diet recommendation:  Discharge Diet Orders (From admission, onward)     Start     Ordered   01/11/23 0000  Diet - low sodium heart healthy        01/11/23 1540             Discharge Diagnoses:  Principal Problem:   Acute diverticulitis Active Problems:   UTI (urinary tract infection)   Chronic heart failure with preserved ejection fraction (HFpEF) (HCC)   CKD (chronic kidney disease) stage 4, GFR 15-29 ml/min (HCC)   Acquired  hypothyroidism   Absence of hip joint, right   Brief Hospital course: Madison [name] is a 82 y.o. 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   Patient states she developed left lower quadrant abdominal pain about 1 week ago.  She has not had any significant nausea or vomiting.  She was diagnosed with UTI at her facility.  Patient states that she did not have any urinary symptoms but typically does not have usual symptoms when she develops UTI.  She denies fevers, chills, diaphoresis.   She is nonambulatory due to removal of previous infected right hip prosthesis but will mobilize in electric wheelchair at her facility.  She has chronic RLE swelling which is unchanged from baseline and improves with elevation when in bed.   ED Course  Labs/Imaging on admission:    Initial vitals showed BP 107/50, pulse 68, RR 19, temp 97.6 F, SpO2 99% on room air.   Labs show WBC 6.5, hemoglobin 11.8, platelets 272,000, sodium 135, potassium 3.8, bicarb 21, BUN 31, creatinine 2.10, serum glucose 105, lipase 24, lactic acid 1.0.   UA shows negative nitrites, small leukocytes, 0-5 RBCs and WBCs, rare bacteria.  Blood cultures and  urine culture in process.   CT abdomen/pelvis without contrast showed diffuse left-sided colonic diverticulosis with faint stranding changes along the distal third of the descending colon.  A mild diverticulitis is favored.  No diverticular abscess or free air.  No urinary stone or obstruction.  Cortical thinning in both kidneys with small right kidney and low normal left kidney.  No bladder thickening.   Patient was given 500 cc normal saline and started on IV Zosyn per pharmacy recommendation.  The hospitalist service was  consulted to admit for further evaluation and management.  Assessment and Plan: Acute diverticulitis: Appears mild on CT imaging without evidence of abscess or free air.  Abdominal pain improved. Briefly treated with IV Zosyn in the hospital which was then transitioned to oral cefadroxil and Flagyl to complete total 7 days course after extensive communication with ID pharmacist and discussion with ID MD on call. Patient reports that she was up to date on her colonoscopies and maybe her last colonoscopy was in 2018 by Dr. Vivien Rossetti GI and she feels that maybe she has aged out of need for further colonoscopies and Dr. Matthias Hughs retired. Defer decision regarding further colonoscopies to her PCP during outpatient follow-up.   Asymptomatic E. coli bacteriuria versus less likely urinary tract infection: Diagnosed at her outside facility, documentation from SNF "Urine culture showed E Coli>100,000c/ml, susceptible to Cefepime, Deftazidime, Ceftriaxone, Gentamicin, Imipenem, Nitrofurantoin, Tobramycin -In the hospital she received 3 doses of IV Zosyn. -As noted above, patient lacks UTI symptoms but per report, she usually does not have any even when she had a urinary tract infection in the past.  Her left lower quadrant abdominal pain may be explained by the acute mild diverticulitis.  Urine microscopy is really not impressive for UTI.  Bladder wall not thickened on CT abdomen. Urine culture does show >100 K colonies of E. coli, sensitivities pending-to be followed up by MD at [facility]. -Communicated extensively with ID pharmacist on call who recommended cefadroxil but an extended duration i.e. 7 days to cover for acute diverticulitis as well.   -Patient's spouse expressed his concern regarding history of recurrent UTIs and in the past reportedly was admitted for several weeks at [facility] hospital due to infected right hip, which was told to be due to UTI, so anytime the UTIs are mentioned, they obviously get  worried.  I assured him that in my clinical opinion, this does not appear to be a UTI, even if it is then it is mild and above antibiotics should take care of it.  Furthermore, I personally discussed with infectious disease MD on-call Dr. Daiva Eves who agrees that the clinical picture is not consistent with UTI and agrees with above management. As per ID pharmacist, "there's a scanned ucx report from SNF in the media tab in EPIC. The Ecoli is I= augmentin and unasyn; R= Levaquin, bactrim.    CKD stage IV: Creatinine 2.10 on admission.  Appears to have progressed to CKD stage IV since June.  Creatinine improved to 1.9 which appears to be the baseline at least since July 2024.  Follow-up with nephrology as an outpatient.  Per spouse, patient has an upcoming first appointment with Dr. Signe Colt next Monday and is advised to keep that appointment.   Chronic HFpEF: Last EF 70-75% by TTE 07/09/2022.  Stable.  Continue oral Lasix 20 mg daily.   Hypothyroidism: Continue home dose Synthroid.   S/p right hip hardware removal (12/2020) due to prior prosthetic joint infection with chronic nonambulatory status:  Chronic and stable.  Fall precautions.  Right hip wound site appears to have healed well.  Patient is nonambulatory and has significant restrictions of right lower extremity movements due to prior surgery and related pains.  Hypokalemia: Replaced prior to discharge.  Chronic anemia Stable.  Hemoglobin likely ranges at baseline between 10-11 g.  Outpatient follow-up.   Consultations: None  Procedures: None   Discharge Instructions  Discharge Instructions     Call MD for:  difficulty breathing, headache or visual disturbances   Complete by: As directed    Call MD for:  extreme fatigue   Complete by: As directed    Call MD for:  persistant dizziness or light-headedness   Complete by: As directed    Call MD for:  persistant nausea and vomiting   Complete by: As directed    Call MD for:  severe  uncontrolled pain   Complete by: As directed    Call MD for:  temperature >100.4   Complete by: As directed    Diet - low sodium heart healthy   Complete by: As directed    Increase activity slowly   Complete by: As directed         Medication List     TAKE these medications    albuterol 108 (90 Base) MCG/ACT inhaler Commonly known as: VENTOLIN HFA Inhale 2 puffs into the lungs every 6 (six) hours as needed for wheezing or shortness of breath.   bifidobacterium infantis capsule Take 1 capsule by mouth daily.   CALCIUM PLUS VITAMIN D3 PO Take 1 tablet by mouth daily. Vitamin D3 + Calcium 600   cefadroxil 500 MG capsule Commonly known as: DURICEF Take 1 capsule (500 mg total) by mouth 2 (two) times daily for 7 days.   ferrous sulfate 325 (65 FE) MG EC tablet Take 325 mg by mouth as directed. Once A Day on Mon, Thu   furosemide 20 MG tablet Commonly known as: LASIX Take 20 mg by mouth daily.   HYDROcodone-acetaminophen 5-325 MG tablet Commonly known as: NORCO/VICODIN Take 1 tablet by mouth in the morning and at bedtime.   ipratropium-albuterol 0.5-2.5 (3) MG/3ML Soln Commonly known as: DUONEB Inhale 3 mLs into the lungs every 8 (eight) hours as needed (wheezing).   levothyroxine 25 MCG tablet Commonly known as: SYNTHROID Take 25 mcg by mouth as directed. Take 1 tablet (25 mcg) along with 75 mcg=100 mcg   levothyroxine 75 MCG tablet Commonly known as: SYNTHROID Take 75 mcg by mouth as directed. Take 1 tablet (75 mcg) along with 25 mcg=100 mcg   loperamide 2 MG capsule Commonly known as: IMODIUM Take 2 mg by mouth every 4 (four) hours as needed for diarrhea or loose stools.   methocarbamol 750 MG tablet Commonly known as: ROBAXIN Take 750 mg by mouth every 6 (six) hours as needed for muscle spasms.   metroNIDAZOLE 500 MG tablet Commonly known as: FLAGYL Take 1 tablet (500 mg total) by mouth every 12 (twelve) hours for 7 days.   nystatin  powder Commonly known as: MYCOSTATIN/NYSTOP Apply 1 application  topically in the morning, at noon, in the evening, and at bedtime. To genital areas and inner thigh   nystatin cream Commonly known as: MYCOSTATIN Apply 1 Application topically at bedtime.   ondansetron 4 MG tablet Commonly known as: ZOFRAN Take 4 mg by mouth every 8 (eight) hours as needed for nausea or vomiting.   Protonix 40 MG tablet Generic drug: pantoprazole Take 40 mg by  mouth daily.   triamcinolone cream 0.5 % Commonly known as: KENALOG Apply 1 Application topically at bedtime.   Vitamin D3 50 MCG (2000 UT) Tabs Take 2 tablets by mouth daily.       Allergies  Allergen Reactions   Keflex [Cephalexin] Diarrhea   Macrobid [Nitrofurantoin] Hives   Amoxicillin Rash      Procedures/Studies: CT ABDOMEN PELVIS WO CONTRAST  Result Date: 01/10/2023 CLINICAL DATA:  Left lower quadrant pain. UTI detected on outside urinalysis but she also has a history of diverticulitis. EXAM: CT ABDOMEN AND PELVIS WITHOUT CONTRAST TECHNIQUE: Multidetector CT imaging of the abdomen and pelvis was performed following the standard protocol without IV contrast. RADIATION DOSE REDUCTION: This exam was performed according to the departmental dose-optimization program which includes automated exposure control, adjustment of the mA and/or kV according to patient size and/or use of iterative reconstruction technique. COMPARISON:  Limited comparison is available with a CTA chest 07/02/2022 and postmyelogram CT lumbar spine dated 04/06/2020. There is no prior abdominopelvic dedicated imaging. FINDINGS: Lower chest: Lung bases demonstrate scattered linear scarring or atelectasis without infiltrates. Interval resolution of the small pleural effusions noted on 07/02/2022. A moderate-sized hiatal hernia is again shown. The cardiac size is normal. There are calcifications in the right coronary artery. No pericardial fluid. Hepatobiliary: The liver is  21 cm length and mildly steatotic. No mass is seen without contrast. Gallbladder is absent and there is no biliary dilatation. Pancreas: The gland is moderately atrophic. No masses seen without contrast or ductal dilatation. Spleen: Unremarkable without contrast.  No splenomegaly. Adrenals/Urinary Tract: No adrenal mass. Cortical thinning noted somewhat in both kidneys. Small length measurement of the right kidney of 7.4 cm with low-normal length measurement on the left at 9 cm. There is no contour deforming abnormality of either kidney. There is no urinary stone or obstruction. Small bilateral extrarenal pelves are again shown. There is no thickening of the bladder wall. Stomach/Bowel: Moderate-sized hiatal hernia. No dilatation or wall thickening including the appendix. There is diffuse diverticulosis distal transverse and left-sided colon. There are faint stranding changes along side the distal third of the descending colon which would either represent a mild uncomplicated diverticulitis or fat scarring from prior diverticular disease. A mild diverticulitis is favored in this case. There is no diverticular abscess or free air. Vascular/Lymphatic: Aortic atherosclerosis. No enlarged abdominal or pelvic lymph nodes. Reproductive: Uterus and bilateral adnexa are unremarkable. Other: No free fluid, free air, free hemorrhage or incarcerated hernia. Small umbilical fat hernia. Musculoskeletal: Old spinous process fusion hardware L3-4 and L4-5 with dorsal decompression at L5-S1 and transitional anatomy with partial lumbarization of S1. Multilevel degenerative discs with vacuum phenomenon. Spondylosis. There is chronic wedging of L1 and 3, chronic and unchanged minimal retrolisthesis at L1-2, minimal anterolisthesis at L3-4, L4-5 and L5-S1, with multilevel acquired foraminal stenosis. Comparison is also made to the right hip CT from 07/11/2022. Redemonstration of removal of hardware from the prior right hip arthroplasty  is again noted, similar appearance of fluid and granulation tissue in the right hip bed with again noted cephalad migration of the right femoral shaft into the area. There is healed fracture deformity along the right acetabular roof with acetabular remodeling. All of these findings were present previously and unchanged. No destructive osseous lesion is evident or new abnormality. There is chronic atrophy in the dorsal paraspinous muscles and right iliopsoas and gluteal musculature. IMPRESSION: 1. Diffuse left-sided colonic diverticulosis, with faint stranding changes alongside the distal third of the descending  colon which would either represent a mild uncomplicated diverticulitis or fat scarring from prior diverticular disease. A mild diverticulitis is favored in this case. No diverticular abscess or free air. 2. No urinary stone or obstruction. 3. Cortical thinning in both kidneys with small right kidney and low-normal left kidney length. No urinary stone or obstruction. No bladder thickening. 4. Aortic and coronary artery atherosclerosis. 5. Moderate-sized hiatal hernia. 6. Mildly prominent liver with mild steatosis. 7. Small umbilical fat hernia. 8. Chronic changes in the right hip with removal of hardware from the prior right hip arthroplasty, and right-sided muscular atrophy described above. 9. Degenerative and chronic postsurgical changes lumbar spine with osteopenia and chronic wedging of L1 and 3. Aortic Atherosclerosis (ICD10-I70.0). Electronically Signed   By: Almira Bar M.D.   On: 01/10/2023 21:40      Subjective: Patient interviewed and examined earlier this morning when she was by herself and then went back late morning and seen along with her spouse at bedside.  Mild intermittent left lower quadrant abdominal pain.  No fever, chills, dysuria, urinary frequency or urgency.  No flank pains reported.  Denies any other complaints and states that she is at her baseline.  She also states that she  was actually back to her baseline even when she came to the ED.  Discharge Exam:  Vitals:   01/11/23 0800 01/11/23 0811 01/11/23 0910 01/11/23 1426  BP: (!) 116/47  (!) 145/64 (!) 141/58  Pulse: 73  69 67  Resp: 14  20 18   Temp:  97.6 F (36.4 C) 97.6 F (36.4 C) (!) 97.4 F (36.3 C)  TempSrc:   Oral Oral  SpO2: 96%  96% 99%    General: Pt lying comfortably in bed & appears in no obvious distress.  Elderly female, moderately built and obese lying comfortably supine.  Oral mucosa moist. Cardiovascular: S1 & S2 heard, RRR, S1/S2 +. No murmurs, rubs, gallops or clicks. No JVD or pedal edema. Respiratory: Clear to auscultation without wheezing, rhonchi or crackles. No increased work of breathing. Abdominal:  Non distended, non tender & soft. No organomegaly or masses appreciated. Normal bowel sounds heard. CNS: Alert and oriented. No focal deficits. Extremities: no edema, no cyanosis.  Limited right lower extremity movements which is flexed at the knee,?  Unclear if she has contractures.  Right hip side surgical scar has healed without acute issues.    The results of significant diagnostics from this hospitalization (including imaging, microbiology, ancillary and laboratory) are listed below for reference.     Microbiology: Recent Results (from the past 240 hour(s))  Blood culture (routine x 2)     Status: None (Preliminary result)   Collection Time: 01/10/23  7:58 PM   Specimen: BLOOD  Result Value Ref Range Status   Specimen Description   Final    BLOOD LEFT ANTECUBITAL Performed at [facility], [address], [place], [place] [address]    Special Requests   Final    BOTTLES DRAWN AEROBIC AND ANAEROBIC Blood Culture results may not be optimal due to an excessive volume of blood received in culture bottles Performed at [facility], [address], [place], [place] [address]    Culture   Final    NO GROWTH < 12 HOURS Performed at [facility], [address], [place], [place] [address]    Report Status PENDING  Incomplete  Blood culture (routine x 2)     Status: None (Preliminary result)  Collection Time: 01/10/23  8:15 PM   Specimen: BLOOD LEFT FOREARM  Result Value Ref Range Status   Specimen Description   Final    BLOOD LEFT FOREARM Performed at [facility], [address] [place], [place] [address]    Special Requests   Final    BOTTLES DRAWN AEROBIC AND ANAEROBIC Blood Culture results may not be optimal due to an excessive volume of blood received in culture bottles Performed at [facility], [address] [place], [place] [address]    Culture   Final    NO GROWTH < 12 HOURS Performed at [facility], [address] [place], [place] [address]    Report Status PENDING  Incomplete  Urine Culture     Status: Abnormal (Preliminary result)   Collection Time: 01/10/23  8:29 PM   Specimen: Urine, Clean Catch  Result Value Ref Range Status   Specimen Description   Final    URINE, CLEAN CATCH Performed at [facility], [address] [place], [place] [address]    Special Requests   Final    NONE Performed at [facility], [address] [place], [place] [address]    Culture (A)  Final    >=100,000 COLONIES/mL ESCHERICHIA COLI SUSCEPTIBILITIES TO FOLLOW Performed at [facility], [address] [place], [place] [address]    Report Status PENDING  Incomplete     Labs: CBC: Recent Labs  Lab 01/10/23 1938 01/11/23 0500  WBC 6.5 6.1  NEUTROABS 3.6  --   HGB 11.8* 10.8*  HCT 39.4 35.3*  MCV 89.5 89.1  PLT 272 230    Basic Metabolic Panel: Recent Labs  Lab 01/10/23 1938 01/11/23 0500  NA 135 136  K 3.8 3.4*  CL 107 108  CO2 21* 21*  GLUCOSE 105* 87  BUN 31* 29*  CREATININE 2.10* 1.95*  CALCIUM 8.7* 8.4*    Liver Function Tests: Recent Labs  Lab 01/10/23 1938  AST 21  ALT 17  ALKPHOS 87  BILITOT  0.6  PROT 7.3  ALBUMIN 3.5     Thyroid function studies Recent Labs    01/10/23 1938  TSH 2.119    Urinalysis    Component Value Date/Time   COLORURINE STRAW (A) 01/10/2023 2029   APPEARANCEUR CLEAR 01/10/2023 2029   LABSPEC 1.004 (L) 01/10/2023 2029   PHURINE 6.0 01/10/2023 2029   GLUCOSEU NEGATIVE 01/10/2023 2029   HGBUR NEGATIVE 01/10/2023 2029   BILIRUBINUR NEGATIVE 01/10/2023 2029   KETONESUR NEGATIVE 01/10/2023 2029   PROTEINUR NEGATIVE 01/10/2023 2029   UROBILINOGEN 0.2 12/30/2009 1134   NITRITE NEGATIVE 01/10/2023 2029   LEUKOCYTESUR SMALL (A) 01/10/2023 2029   Discussed in detail with patient's spouse at bedside.   Time coordinating discharge: 25 minutes  SIGNED:  Marcellus Scott, MD,  FACP, [facility], [facility], [facility]   [facility] [place]     To contact the attending provider between 7A-7P or the covering provider during after hours 7P-7A, please log into the web site [email or web address] and access using universal [place] password for that web site. If you do not have the password, please call the hospital operator.

## 2023-01-11 NOTE — Progress Notes (Addendum)
Patient Name: MIRTIE CAVETT           DOB: 1940-07-17  MRN: [id number]      Admission Date: 01/10/2023  Attending Provider: Elease Etienne, MD  Primary Diagnosis: Acute diverticulitis   Level of care: Med-Surg    CROSS COVER NOTE   Date of Service   01/11/2023   Artasia Sajjad Sweeny, 82 y.o. female, was admitted on 01/10/2023 for Acute diverticulitis.    HPI/Events of Note   Follow up on allergic rxn to cefadroxil: Overall patient feels better.  No angioedema, wheezing, respiratory distress noted. Hemodynamically stable on RA.  Patient at bedside is A/Ox4, able to communicate needs well and in no distress.  She reports relief after benadryl administration. Pruritus has subsided, some small scattered pink lesions remain on BUE, torso, and above bilateral knees. No lesions to back noted.    Interventions/ Plan   Nursing staff to report any acute changes.         Anthoney Harada, DNP, ACNPC- AG [facility]

## 2023-01-11 NOTE — Discharge Instructions (Signed)

## 2023-01-11 NOTE — Plan of Care (Signed)

## 2023-01-11 NOTE — Progress Notes (Addendum)
Addendum  A few minutes ago, patient's RN reported that patient complained of rash/hives on her arms, legs, upper chest along with pruritus.  Went to patient's room and examined.  She does have few pruritic fine lesions,?  Papular on her upper extremities, upper anterior mid chest.  Got a dose of cefadroxil at 2:25 PM.?  Allergic reaction to that.  Patient had however tolerated multiple doses of Zosyn prior to that.  Listed allergy to amoxicillin (rash) and Keflex (diarrhea)  Plan: Canceled discharge. Discontinued cefadroxil As needed Benadryl elixir 25 mg every 6 hours as needed.  Monitor closely. Reassess in a.m. regarding change in antibiotic regimen.  Hopefully by then the final urine culture sensitivity results are back. Updated patient at bedside, patient's covering RN and also called and updated patient's spouse.  Marcellus Scott, MD,  FACP, [facility], [facility], [facility]   [facility] [place]     To contact the attending provider between 7A-7P or the covering provider during after hours 7P-7A, please log into the web site [email or web address] and access using universal  password for that web site. If you do not have the password, please call the hospital operator.

## 2023-01-11 NOTE — ED Notes (Addendum)
ED TO INPATIENT HANDOFF REPORT  ED Nurse Name and Phone #: Ronalee Red RN  S Name/Age/Gender Madison [name] 82 y.o. female Room/Bed: WA19/WA19  Code Status   Code Status: Full Code  Home/SNF/Other Home Patient oriented to: self, place, time, and situation Is this baseline? Yes   Triage Complete: Triage complete  Chief Complaint Acute diverticulitis [K57.92]  Triage Note Patient presents from [facility] due to staff suspicion of a UTI. She had a positive result for UTI today. Symptoms started about a week ago, but specific symptoms reported were unclear other than burning with urination. She has had frequent UTI's recently and the staff was concerned she would become septic quickly.     EMS vitals: 132/63 BP 70 HR 16 RR  99% SPO2 on room air 97.7 temp    Allergies Allergies  Allergen Reactions   Keflex [Cephalexin] Diarrhea   Macrobid [Nitrofurantoin] Hives   Amoxicillin Rash    Level of Care/Admitting Diagnosis ED Disposition     ED Disposition  Admit   Condition  --   Comment  Hospital Area: [facility] [[id number]]  Level of Care: Med-Surg [16]  May place patient in observation at [facility] or [facility] if equivalent level of care is available:: No  Covid Evaluation: Asymptomatic - no recent exposure (last 10 days) testing not required  Diagnosis: Acute diverticulitis [[id number]]  Admitting Physician: Charlsie Quest [[id number]]  Attending Physician: Charlsie Quest [[id number]]          B Medical/Surgery History Past Medical History:  Diagnosis Date   Benign hypertension with chronic kidney disease, stage III (HCC)    Overview:  Last Assessment & Plan:  Usually the patient is hypertensive. However today her blood pressure is 105 systolic. She has been feeling fatigued with some lightheadedness. Part of this may be related to her lower blood pressure. Her pressure may be improved with her decrease in salt and fluid intake. I've instructed  her to put her lisinopril/hctz on hold. 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 It was possibly reentrant. She does well with diltiazem. No change in therapy.   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   Thyroid disease    Urinary incontinence    Past Surgical History:  Procedure Laterality Date   CATARACT EXTRACTION Left 8/11   CATARACT EXTRACTION Right 3/12   CHOLECYSTECTOMY  1994   Copsilotomy Laser Treatment Left 6/12   eye   ELBOW SURGERY  8/12   EYE SURGERY Left 09/2008   macular hole   EYE SURGERY Right 12/11   Lumbar Infusion  2011   MOLE REMOVAL  06/17/2015   TOTAL KNEE ARTHROPLASTY Left 6/11   TOTAL KNEE ARTHROPLASTY Right 06/13/2011     A IV Location/Drains/Wounds Patient Lines/Drains/Airways Status     Active Line/Drains/Airways     Name Placement date Placement time Site Days   Peripheral IV 01/10/23 20 G 1.88" Left Antecubital 01/10/23  0758  Antecubital  1   Wound / Incision (Open or Dehisced) 07/05/22 Thigh Anterior;Right Right Hip Cellulitis 07/05/22  0730  Thigh  190   Wound / Incision (Open or Dehisced) 10/13/22 Incision -  Open;Other (Comment) Thigh Anterior;Right small round "pencil eraser" sized wound with xeroform packing, scant serosanguineous drainage 10/13/22  2330  Thigh  90            Intake/Output Last 24 hours  Intake/Output Summary (Last 24 hours) at 01/11/2023 0744 Last data filed at 01/11/2023 0126 Gross per 24 hour  Intake --  Output 700 ml  Net -700 ml    Labs/Imaging Results for orders placed or performed during the hospital encounter of 01/10/23 (from the past 48 hour(s))  CBC with Differential     Status: Abnormal   Collection Time: 01/10/23  7:38 PM  Result Value Ref Range   WBC 6.5 4.0 - 10.5 K/uL   RBC 4.40 3.87 - 5.11 MIL/uL   Hemoglobin 11.8 (L) 12.0 - 15.0 g/dL   HCT 44.0 34.7 - 42.5 %   MCV 89.5 80.0 - 100.0 fL   MCH 26.8 26.0 - 34.0 pg   MCHC 29.9 (L) 30.0 - 36.0 g/dL   RDW 95.6 38.7 - 56.4 %   Platelets 272 150 - 400 K/uL   nRBC 0.0 0.0 - 0.2 %   Neutrophils Relative % 55 %   Neutro Abs 3.6 1.7 - 7.7 K/uL   Lymphocytes Relative 30 %   Lymphs Abs 1.9 0.7 - 4.0 K/uL   Monocytes Relative 10 %   Monocytes Absolute 0.6 0.1 - 1.0 K/uL   Eosinophils Relative 3 %   Eosinophils Absolute 0.2 0.0 - 0.5 K/uL   Basophils Relative 1 %   Basophils Absolute 0.0 0.0 - 0.1 K/uL   Immature Granulocytes 1 %   Abs Immature Granulocytes 0.03 0.00 - 0.07 K/uL    Comment: Performed at [facility], [address] [place], [place] [address]  Comprehensive metabolic panel     Status: Abnormal   Collection Time: 01/10/23  7:38 PM  Result Value Ref Range   Sodium 135 135 - 145 mmol/L   Potassium 3.8 3.5 - 5.1 mmol/L   Chloride 107 98 - 111 mmol/L   CO2 21 (L) 22 - 32 mmol/L   Glucose, Bld 105 (H) 70 - 99 mg/dL    Comment: Glucose reference range applies only to samples taken after fasting for at least 8 hours.   BUN 31 (H) 8 - 23 mg/dL   Creatinine, Ser 1.88 (H) 0.44 - 1.00 mg/dL   Calcium 8.7 (L) 8.9 - 10.3 mg/dL   Total Protein 7.3 6.5 - 8.1 g/dL   Albumin  3.5 3.5 - 5.0 g/dL   AST 21 15 - 41 U/L   ALT 17 0 - 44 U/L   Alkaline Phosphatase 87 38 - 126 U/L   Total Bilirubin 0.6 0.3 - 1.2 mg/dL   GFR, Estimated 23 (L) >60 mL/min    Comment: (NOTE) Calculated using the CKD-EPI Creatinine Equation (2021)    Anion gap 7 5 - 15    Comment: Performed  at [facility], [address], [place], [place] [address]  Lipase, blood     Status: None   Collection Time: 01/10/23  7:38 PM  Result Value Ref Range   Lipase 24 11 - 51 U/L    Comment: Performed at [facility], [address], [place], [place] [address]  TSH     Status: None   Collection Time: 01/10/23  7:38 PM  Result Value Ref Range   TSH 2.119 0.350 - 4.500 uIU/mL    Comment: Performed by a 3rd Generation assay with a functional sensitivity of <=0.01 uIU/mL. Performed at [facility], [address] [place], [place] [address]   Lactic acid, plasma     Status: None   Collection Time: 01/10/23  7:58 PM  Result Value Ref Range   Lactic Acid, Venous 1.0 0.5 - 1.9 mmol/L    Comment: Performed at [facility], [address] [place], [place] [address]  Urinalysis, w/ Reflex to Culture (Infection Suspected) -Urine, Clean Catch     Status: Abnormal   Collection Time: 01/10/23  8:29 PM  Result Value Ref Range   Specimen Source URINE, CLEAN CATCH    Color, Urine STRAW (A) YELLOW   APPearance CLEAR CLEAR   Specific Gravity, Urine 1.004 (L) 1.005 - 1.030   pH 6.0 5.0 - 8.0   Glucose, UA NEGATIVE NEGATIVE mg/dL   Hgb urine dipstick NEGATIVE NEGATIVE   Bilirubin Urine NEGATIVE NEGATIVE   Ketones, ur NEGATIVE NEGATIVE mg/dL   Protein, ur NEGATIVE NEGATIVE mg/dL   Nitrite NEGATIVE NEGATIVE   Leukocytes,Ua SMALL (A) NEGATIVE   RBC / HPF 0-5 0 - 5 RBC/hpf   WBC, UA 0-5 0 - 5 WBC/hpf    Comment:        Reflex urine culture not performed if WBC <=10, OR if Squamous epithelial cells >5. If Squamous epithelial cells  >5 suggest recollection.    Bacteria, UA RARE (A) NONE SEEN   Squamous Epithelial / HPF 0-5 0 - 5 /HPF    Comment: Performed at [facility], [address] [place], [place] [address]  CBC     Status: Abnormal   Collection Time: 01/11/23  5:00 AM  Result Value Ref Range   WBC 6.1 4.0 - 10.5 K/uL   RBC 3.96 3.87 - 5.11 MIL/uL   Hemoglobin 10.8 (L) 12.0 - 15.0 g/dL   HCT 60.1 (L) 09.3 - 23.5 %   MCV 89.1 80.0 - 100.0 fL   MCH 27.3 26.0 - 34.0 pg   MCHC 30.6 30.0 - 36.0 g/dL   RDW 57.3 22.0 - 25.4 %   Platelets 230 150 - 400 K/uL   nRBC 0.0 0.0 - 0.2 %    Comment: Performed at [facility], [address], [place], [place] [address]  Basic metabolic panel     Status: Abnormal   Collection Time: 01/11/23  5:00 AM  Result Value Ref Range   Sodium 136 135 - 145 mmol/L   Potassium 3.4 (L) 3.5 - 5.1 mmol/L   Chloride 108 98 - 111 mmol/L   CO2 21 (L) 22 - 32 mmol/L   Glucose, Bld 87 70 - 99 mg/dL    Comment: Glucose reference range applies only to samples taken after fasting for at least 8 hours.   BUN 29 (H) 8 - 23 mg/dL   Creatinine, Ser 3.76 (H) 0.44 - 1.00 mg/dL   Calcium 8.4 (L) 8.9 -  10.3 mg/dL   GFR, Estimated 25 (L) >60 mL/min    Comment: (NOTE) Calculated using the CKD-EPI Creatinine Equation (2021)    Anion gap 7 5 - 15    Comment: Performed at [facility], [address] [place], [place] [address]   CT ABDOMEN PELVIS WO CONTRAST  Result Date: 01/10/2023 CLINICAL DATA:  Left lower quadrant pain. UTI detected on outside urinalysis but she also has a history of diverticulitis. EXAM: CT ABDOMEN AND PELVIS WITHOUT CONTRAST TECHNIQUE: Multidetector CT imaging of the abdomen and pelvis was performed following the standard protocol without IV contrast. RADIATION DOSE REDUCTION: This exam was performed according to the departmental dose-optimization program which includes automated exposure control, adjustment of the mA  and/or kV according to patient size and/or use of iterative reconstruction technique. COMPARISON:  Limited comparison is available with a CTA chest 07/02/2022 and postmyelogram CT lumbar spine dated 04/06/2020. There is no prior abdominopelvic dedicated imaging. FINDINGS: Lower chest: Lung bases demonstrate scattered linear scarring or atelectasis without infiltrates. Interval resolution of the small pleural effusions noted on 07/02/2022. A moderate-sized hiatal hernia is again shown. The cardiac size is normal. There are calcifications in the right coronary artery. No pericardial fluid. Hepatobiliary: The liver is 21 cm length and mildly steatotic. No mass is seen without contrast. Gallbladder is absent and there is no biliary dilatation. Pancreas: The gland is moderately atrophic. No masses seen without contrast or ductal dilatation. Spleen: Unremarkable without contrast.  No splenomegaly. Adrenals/Urinary Tract: No adrenal mass. Cortical thinning noted somewhat in both kidneys. Small length measurement of the right kidney of 7.4 cm with low-normal length measurement on the left at 9 cm. There is no contour deforming abnormality of either kidney. There is no urinary stone or obstruction. Small bilateral extrarenal pelves are again shown. There is no thickening of the bladder wall. Stomach/Bowel: Moderate-sized hiatal hernia. No dilatation or wall thickening including the appendix. There is diffuse diverticulosis distal transverse and left-sided colon. There are faint stranding changes along side the distal third of the descending colon which would either represent a mild uncomplicated diverticulitis or fat scarring from prior diverticular disease. A mild diverticulitis is favored in this case. There is no diverticular abscess or free air. Vascular/Lymphatic: Aortic atherosclerosis. No enlarged abdominal or pelvic lymph nodes. Reproductive: Uterus and bilateral adnexa are unremarkable. Other: No free fluid, free  air, free hemorrhage or incarcerated hernia. Small umbilical fat hernia. Musculoskeletal: Old spinous process fusion hardware L3-4 and L4-5 with dorsal decompression at L5-S1 and transitional anatomy with partial lumbarization of S1. Multilevel degenerative discs with vacuum phenomenon. Spondylosis. There is chronic wedging of L1 and 3, chronic and unchanged minimal retrolisthesis at L1-2, minimal anterolisthesis at L3-4, L4-5 and L5-S1, with multilevel acquired foraminal stenosis. Comparison is also made to the right hip CT from 07/11/2022. Redemonstration of removal of hardware from the prior right hip arthroplasty is again noted, similar appearance of fluid and granulation tissue in the right hip bed with again noted cephalad migration of the right femoral shaft into the area. There is healed fracture deformity along the right acetabular roof with acetabular remodeling. All of these findings were present previously and unchanged. No destructive osseous lesion is evident or new abnormality. There is chronic atrophy in the dorsal paraspinous muscles and right iliopsoas and gluteal musculature. IMPRESSION: 1. Diffuse left-sided colonic diverticulosis, with faint stranding changes alongside the distal third of the descending colon which would either represent a mild uncomplicated diverticulitis or fat scarring from  prior diverticular disease. A mild diverticulitis is favored in this case. No diverticular abscess or free air. 2. No urinary stone or obstruction. 3. Cortical thinning in both kidneys with small right kidney and low-normal left kidney length. No urinary stone or obstruction. No bladder thickening. 4. Aortic and coronary artery atherosclerosis. 5. Moderate-sized hiatal hernia. 6. Mildly prominent liver with mild steatosis. 7. Small umbilical fat hernia. 8. Chronic changes in the right hip with removal of hardware from the prior right hip arthroplasty, and right-sided muscular atrophy described above. 9.  Degenerative and chronic postsurgical changes lumbar spine with osteopenia and chronic wedging of L1 and 3. Aortic Atherosclerosis (ICD10-I70.0). Electronically Signed   By: Almira Bar M.D.   On: 01/10/2023 21:40    Pending Labs Unresulted Labs (From admission, onward)     Start     Ordered   01/10/23 1906  Urine Culture  Once,   URGENT       Question:  Indication  Answer:  Bacteriuria screening (OB/GYN or Uro)   01/10/23 1907   01/10/23 1904  Blood culture (routine x 2)  BLOOD CULTURE X 2,   R      01/10/23 1907            Vitals/Pain Today's Vitals   01/11/23 0122 01/11/23 0411 01/11/23 0413 01/11/23 0700  BP: (!) 136/52 (!) 142/60  (!) 140/61  Pulse: 72 72  67  Resp: 13 13  12   Temp: 97.7 F (36.5 C)  97.6 F (36.4 C)   TempSrc: Oral  Oral   SpO2: 97% 97%  96%    Isolation Precautions No active isolations  Medications Medications  piperacillin-tazobactam (ZOSYN) IVPB 3.375 g (3.375 g Intravenous New Bag/Given 01/11/23 0616)  heparin injection 5,000 Units (5,000 Units Subcutaneous Given 01/11/23 0615)  acetaminophen (TYLENOL) tablet 650 mg (has no administration in time range)    Or  acetaminophen (TYLENOL) suppository 650 mg (has no administration in time range)  ondansetron (ZOFRAN) tablet 4 mg (has no administration in time range)    Or  ondansetron (ZOFRAN) injection 4 mg (has no administration in time range)  senna-docusate (Senokot-S) tablet 1 tablet (has no administration in time range)  furosemide (LASIX) tablet 20 mg (has no administration in time range)  levothyroxine (SYNTHROID) tablet 100 mcg (100 mcg Oral Given 01/11/23 0615)  ipratropium-albuterol (DUONEB) 0.5-2.5 (3) MG/3ML nebulizer solution 3 mL (has no administration in time range)  methocarbamol (ROBAXIN) tablet 750 mg (has no administration in time range)  pantoprazole (PROTONIX) EC tablet 40 mg (has no administration in time range)  sodium chloride 0.9 % bolus 500 mL (500 mLs Intravenous Bolus  01/10/23 2102)  piperacillin-tazobactam (ZOSYN) IVPB 3.375 g (0 g Intravenous Stopped 01/11/23 0408)  potassium chloride SA (KLOR-CON M) CR tablet 40 mEq (40 mEq Oral Given 01/11/23 0615)    Mobility      Focused Assessments    R Recommendations: See Admitting Provider Note  Report given to:   Additional Notes:

## 2023-01-12 ENCOUNTER — Encounter: Payer: Self-pay | Admitting: Nurse Practitioner

## 2023-01-12 DIAGNOSIS — K5792 Diverticulitis of intestine, part unspecified, without perforation or abscess without bleeding: Secondary | ICD-10-CM | POA: Diagnosis not present

## 2023-01-12 LAB — URINE CULTURE: Culture: >=100000 — AB

## 2023-01-12 MED ORDER — CIPROFLOXACIN HCL 500 MG PO TABS: 500.0000 mg | ORAL_TABLET | Freq: Two times a day (BID) | ORAL | 0 refills | Status: DC

## 2023-01-12 MED ORDER — DIPHENHYDRAMINE HCL 12.5 MG/5ML PO ELIX: 25.0000 mg | ORAL_SOLUTION | Freq: Four times a day (QID) | ORAL | Status: DC | PRN

## 2023-01-12 MED ORDER — METRONIDAZOLE 500 MG PO TABS: 500.0000 mg | ORAL_TABLET | Freq: Two times a day (BID) | ORAL | 0 refills | Status: AC

## 2023-01-12 MED ORDER — CIPROFLOXACIN HCL 500 MG PO TABS
500.0000 mg | ORAL_TABLET | Freq: Two times a day (BID) | ORAL | Status: DC
  Administered 2023-01-12: 500 mg via ORAL
  Filled 2023-01-12: qty 1

## 2023-01-12 MED ORDER — FOSFOMYCIN TROMETHAMINE 3 G PO PACK
3.0000 g | PACK | Freq: Once | ORAL | Status: AC
  Administered 2023-01-12: 3 g via ORAL
  Filled 2023-01-12: qty 3

## 2023-01-12 MED ORDER — CIPROFLOXACIN HCL 500 MG PO TABS: 500.0000 mg | ORAL_TABLET | Freq: Two times a day (BID) | ORAL | 0 refills | Status: AC

## 2023-01-12 NOTE — Discharge Summary (Signed)
Physician Discharge Summary  Madison [name] DOB: [birth year]  PCP: Madison Patricia, MD  Admitted from: SNF Discharged to: Return to prior SNF [facility]  Admit date: 01/10/2023 Discharge date: 01/12/2023  Recommendations for Outpatient Follow-up:    Follow-up Information     MD at SNF Follow up.   Why: To be seen in 2 to 3 days with repeat labs (CBC & BMP).  Patient to keep up upcoming nephrology appointment next Monday.        [name], Madison Norman, MD. Schedule an appointment as soon as possible for a visit.   Specialty: Family Medicine        Madison Buttner, MD Follow up on 01/16/2023.   Specialty: Nephrology Why: As per patient's spouse, patient has an upcoming appointment with nephrology next Monday. Contact information: [address] [place] [address] [phone]               MD at SNF to address the following: Patient on multiple topical antifungal creams/powder and on dual vitamin D supplements.  Kindly evaluate and discontinue those that are not indicated. Kindly also follow-up final urine culture sensitivity results that were sent from the hospital and address them as deemed appropriate.   Home Health: None.    Equipment/Devices: TBD at SNF.    Discharge Condition: Improved and stable.   Code Status: Limited: Do not attempt resuscitation (DNR) -DNR-LIMITED -Do Not Intubate/DNI  Confirmed this with patient's spouse.  Patient has an out of facility valid DNR form. Diet recommendation:  Discharge Diet Orders (From admission, onward)     Start     Ordered   01/11/23 0000  Diet - low sodium heart healthy        01/11/23 1540             Discharge Diagnoses:  Principal Problem:   Acute diverticulitis Active Problems:   UTI (urinary tract infection)   Chronic heart failure with preserved ejection fraction (HFpEF) (HCC)   CKD (chronic kidney disease) stage 4, GFR 15-29 ml/min (HCC)   Acquired  hypothyroidism   Absence of hip joint, right   Brief Hospital course: Madison [name] is a 82 y.o. 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   Patient states she developed left lower quadrant abdominal pain about 1 week ago.  She has not had any significant nausea or vomiting.  She was diagnosed with UTI at her facility.  Patient states that she did not have any urinary symptoms but typically does not have usual symptoms when she develops UTI.  She denies fevers, chills, diaphoresis.   She is nonambulatory due to removal of previous infected right hip prosthesis but will mobilize in electric wheelchair at her facility.  She has chronic RLE swelling which is unchanged from baseline and improves with elevation when in bed.   ED Course  Labs/Imaging on admission:    Initial vitals showed BP 107/50, pulse 68, RR 19, temp 97.6 F, SpO2 99% on room air.   Labs show WBC 6.5, hemoglobin 11.8, platelets 272,000, sodium 135, potassium 3.8, bicarb 21, BUN 31, creatinine 2.10, serum glucose 105, lipase 24, lactic acid 1.0.   UA shows negative nitrites, small leukocytes, 0-5 RBCs and WBCs, rare bacteria.  Blood cultures and  urine culture in process.   CT abdomen/pelvis without contrast showed diffuse left-sided colonic diverticulosis with faint stranding changes along the distal third of the descending colon.  A mild diverticulitis is favored.  No diverticular abscess or free air.  No urinary stone or obstruction.  Cortical thinning in both kidneys with small right kidney and low normal left kidney.  No bladder thickening.   Patient was given 500 cc normal saline and started on IV Zosyn per pharmacy recommendation.  The hospitalist service was  consulted to admit for further evaluation and management.  Assessment and Plan: Acute diverticulitis: Appears mild on CT imaging without evidence of abscess or free air.  Abdominal pain improved. Briefly treated with IV Zosyn in the hospital which was initially transitioned to oral cefadroxil and Flagyl.  However patient had allergic reaction to cefadroxil including pruritic skin rash.  Cefadroxil was discontinued and added to her allergy list.   On day of discharge 01/12/2023, reviewed case again in detail with infectious disease MD on-call Dr. Ninetta Lights and as per recommendations, changed antibiotics to Cipro + Flagyl to complete total 7 days course. Patient reports that she was up to date on her colonoscopies and maybe her last colonoscopy was in 2018 by Dr. Vivien [name] GI and she feels that maybe she has aged out of need for further colonoscopies and Dr. Matthias [name] retired. Defer decision regarding further colonoscopies to her PCP during outpatient follow-up.   Asymptomatic E. coli bacteriuria versus less likely urinary tract infection: Diagnosed at her outside facility, documentation from SNF "Urine culture showed E Coli>100,000c/ml, susceptible to Cefepime, Deftazidime, Ceftriaxone, Gentamicin, Imipenem, Nitrofurantoin, Tobramycin -In the hospital she received 3 doses of IV Zosyn. -As noted above, patient lacks UTI symptoms but per report, she usually does not have any even when she had a urinary tract infection in the past.  Her left lower quadrant abdominal pain may be explained by the acute mild diverticulitis.  Urine microscopy is really not impressive for UTI.  Bladder wall not thickened on CT abdomen. Urine culture does show >100 K colonies of E. coli, resistant to ampicillin, ciprofloxacin, Bactrim and ampicillin/sulbactam. -On 01/11/2023, patient's spouse expressed his concern regarding history of recurrent UTIs and in the past reportedly was admitted for several weeks at [facility] hospital due  to infected right hip, which was told to be due to UTI, so anytime the UTIs are mentioned, they obviously get worried.  I assured him that in my clinical opinion, this does not appear to be a UTI, even if it is then it is mild and above antibiotics should take care of it.  Furthermore, I personally discussed with infectious disease MD on-call yesterday and today regarding management is noted.  Despite low index of suspicion for a true UTI, completing treatment for same. As per ID pharmacist, "there's a scanned ucx report from SNF in the media tab in EPIC. The Ecoli is I= augmentin and unasyn; R= Levaquin, bactrim.   Addendum: As noted above, after initial IV Zosyn, was switched to to cefadroxil briefly on 9/18 with plans to DC and after the first dose of cefadroxil, she developed pruritic skin rash.  Likely a reaction to cefadroxil.  This was discontinued and added to her allergy list. Communicated extensively with infectious disease MD on-call, Dr. Ninetta Lights on 9/19 and as per recommendations, completed treatment of E. coli UTI with a single dose of fosfomycin.   CKD stage IV: Creatinine 2.10 on admission.  Appears to have progressed to CKD stage IV  since June.  Creatinine improved to 1.9 which appears to be the baseline at least since July 2024.  Follow-up with nephrology as an outpatient.  Per spouse, patient has an upcoming first appointment with Dr. Signe Colt next Monday and is advised to keep that appointment.   Chronic HFpEF: Last EF 70-75% by TTE 07/09/2022.  Stable.  Continue oral Lasix 20 mg daily.   Hypothyroidism: Continue home dose Synthroid.   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  Fall precautions.  Right hip wound site appears to have healed well.  Patient is nonambulatory and has significant restrictions of right lower extremity movements due to prior surgery and related  pains.  Hypokalemia: Replaced prior to discharge.  Chronic anemia Stable.  Hemoglobin likely ranges at baseline between 10-11 g.  Outpatient follow-up.   Consultations: None  Procedures: None   Discharge Instructions  Discharge Instructions     Call MD for:  difficulty breathing, headache or visual disturbances   Complete by: As directed    Call MD for:  extreme fatigue   Complete by: As directed    Call MD for:  persistant dizziness or light-headedness   Complete by: As directed    Call MD for:  persistant nausea and vomiting   Complete by: As directed    Call MD for:  severe uncontrolled pain   Complete by: As directed    Call MD for:  temperature >100.4   Complete by: As directed    Diet - low sodium heart healthy   Complete by: As directed    Increase activity slowly   Complete by: As directed         Medication List     STOP taking these medications    cefadroxil 500 MG capsule Commonly known as: DURICEF       TAKE these medications    albuterol 108 (90 Base) MCG/ACT inhaler Commonly known as: VENTOLIN HFA Inhale 2 puffs into the lungs every 6 (six) hours as needed for wheezing or shortness of breath.   bifidobacterium infantis capsule Take 1 capsule by mouth daily.   CALCIUM PLUS VITAMIN D3 PO Take 1 tablet by mouth daily. Vitamin D3 + Calcium 600   ciprofloxacin 500 MG tablet Commonly known as: Cipro Take 1 tablet (500 mg total) by mouth 2 (two) times daily for 6 days.   diphenhydrAMINE 12.5 MG/5ML elixir Commonly known as: BENADRYL Take 10 mLs (25 mg total) by mouth every 6 (six) hours as needed for up to 3 days for allergies or itching.   ferrous sulfate 325 (65 FE) MG EC tablet Take 325 mg by mouth as directed. Once A Day on Mon, Thu   furosemide 20 MG tablet Commonly known as: LASIX Take 20 mg by mouth daily.   HYDROcodone-acetaminophen 5-325 MG tablet Commonly known as: NORCO/VICODIN Take 1 tablet by mouth in the  morning and at bedtime.   ipratropium-albuterol 0.5-2.5 (3) MG/3ML Soln Commonly known as: DUONEB Inhale 3 mLs into the lungs every 8 (eight) hours as needed (wheezing).   levothyroxine 25 MCG tablet Commonly known as: SYNTHROID Take 25 mcg by mouth as directed. Take 1 tablet (25 mcg) along with 75 mcg=100 mcg   levothyroxine 75 MCG tablet Commonly known as: SYNTHROID Take 75 mcg by mouth as directed. Take 1 tablet (75 mcg) along with 25 mcg=100 mcg   loperamide 2 MG capsule Commonly known as: IMODIUM Take 2 mg  by mouth every 4 (four) hours as needed for diarrhea or loose stools.   metroNIDAZOLE 500 MG tablet Commonly known as: FLAGYL Take 1 tablet (500 mg total) by mouth every 12 (twelve) hours for 6 days.   nystatin powder Commonly known as: MYCOSTATIN/NYSTOP Apply 1 application  topically in the morning, at noon, in the evening, and at bedtime. To genital areas and inner thigh   nystatin cream Commonly known as: MYCOSTATIN Apply 1 Application topically at bedtime.   ondansetron 4 MG tablet Commonly known as: ZOFRAN Take 4 mg by mouth every 8 (eight) hours as needed for nausea or vomiting.   Protonix 40 MG tablet Generic drug: pantoprazole Take 40 mg by mouth daily.   triamcinolone cream 0.5 % Commonly known as: KENALOG Apply 1 Application topically at bedtime.   Vitamin D3 50 MCG (2000 UT) Tabs Take 2 tablets by mouth daily.       Allergies  Allergen Reactions   Keflex [Cephalexin] Diarrhea   Macrobid [Nitrofurantoin] Hives   Amoxicillin Rash   Cefadroxil Rash      Procedures/Studies: CT ABDOMEN PELVIS WO CONTRAST  Result Date: 01/10/2023 CLINICAL DATA:  Left lower quadrant pain. UTI detected on outside urinalysis but she also has a history of diverticulitis. EXAM: CT ABDOMEN AND PELVIS WITHOUT CONTRAST TECHNIQUE: Multidetector CT imaging of the abdomen and pelvis was performed following the standard protocol without IV contrast. RADIATION DOSE  REDUCTION: This exam was performed according to the departmental dose-optimization program which includes automated exposure control, adjustment of the mA and/or kV according to patient size and/or use of iterative reconstruction technique. COMPARISON:  Limited comparison is available with a CTA chest 07/02/2022 and postmyelogram CT lumbar spine dated 04/06/2020. There is no prior abdominopelvic dedicated imaging. FINDINGS: Lower chest: Lung bases demonstrate scattered linear scarring or atelectasis without infiltrates. Interval resolution of the small pleural effusions noted on 07/02/2022. A moderate-sized hiatal hernia is again shown. The cardiac size is normal. There are calcifications in the right coronary artery. No pericardial fluid. Hepatobiliary: The liver is 21 cm length and mildly steatotic. No mass is seen without contrast. Gallbladder is absent and there is no biliary dilatation. Pancreas: The gland is moderately atrophic. No masses seen without contrast or ductal dilatation. Spleen: Unremarkable without contrast.  No splenomegaly. Adrenals/Urinary Tract: No adrenal mass. Cortical thinning noted somewhat in both kidneys. Small length measurement of the right kidney of 7.4 cm with low-normal length measurement on the left at 9 cm. There is no contour deforming abnormality of either kidney. There is no urinary stone or obstruction. Small bilateral extrarenal pelves are again shown. There is no thickening of the bladder wall. Stomach/Bowel: Moderate-sized hiatal hernia. No dilatation or wall thickening including the appendix. There is diffuse diverticulosis distal transverse and left-sided colon. There are faint stranding changes along side the distal third of the descending colon which would either represent a mild uncomplicated diverticulitis or fat scarring from prior diverticular disease. A mild diverticulitis is favored in this case. There is no diverticular abscess or free air. Vascular/Lymphatic:  Aortic atherosclerosis. No enlarged abdominal or pelvic lymph nodes. Reproductive: Uterus and bilateral adnexa are unremarkable. Other: No free fluid, free air, free hemorrhage or incarcerated hernia. Small umbilical fat hernia. Musculoskeletal: Old spinous process fusion hardware L3-4 and L4-5 with dorsal decompression at L5-S1 and transitional anatomy with partial lumbarization of S1. Multilevel degenerative discs with vacuum phenomenon. Spondylosis. There is chronic wedging of L1 and 3, chronic and unchanged minimal retrolisthesis at L1-2, minimal  anterolisthesis at L3-4, L4-5 and L5-S1, with multilevel acquired foraminal stenosis. Comparison is also made to the right hip CT from 07/11/2022. Redemonstration of removal of hardware from the prior right hip arthroplasty is again noted, similar appearance of fluid and granulation tissue in the right hip bed with again noted cephalad migration of the right femoral shaft into the area. There is healed fracture deformity along the right acetabular roof with acetabular remodeling. All of these findings were present previously and unchanged. No destructive osseous lesion is evident or new abnormality. There is chronic atrophy in the dorsal paraspinous muscles and right iliopsoas and gluteal musculature. IMPRESSION: 1. Diffuse left-sided colonic diverticulosis, with faint stranding changes alongside the distal third of the descending colon which would either represent a mild uncomplicated diverticulitis or fat scarring from prior diverticular disease. A mild diverticulitis is favored in this case. No diverticular abscess or free air. 2. No urinary stone or obstruction. 3. Cortical thinning in both kidneys with small right kidney and low-normal left kidney length. No urinary stone or obstruction. No bladder thickening. 4. Aortic and coronary artery atherosclerosis. 5. Moderate-sized hiatal hernia. 6. Mildly prominent liver with mild steatosis. 7. Small umbilical fat hernia.  8. Chronic changes in the right hip with removal of hardware from the prior right hip arthroplasty, and right-sided muscular atrophy described above. 9. Degenerative and chronic postsurgical changes lumbar spine with osteopenia and chronic wedging of L1 and 3. Aortic Atherosclerosis (ICD10-I70.0). Electronically Signed   By: Almira Bar M.D.   On: 01/10/2023 21:40      Subjective: Left lower quadrant abdominal pain has resolved.  No new skin rashes.  Still has skin rash from yesterday which may be slowly improving.  No itching reported.  Discharge Exam:  Vitals:   01/11/23 1426 01/11/23 1743 01/11/23 2103 01/12/23 0520  BP: (!) 141/58 (!) 129/53 (!) 136/93 (!) 135/47  Pulse: 67 73 77 73  Resp: 18 20 17 16   Temp: (!) 97.4 F (36.3 C) 98.1 F (36.7 C) 98.1 F (36.7 C) 98 F (36.7 C)  TempSrc: Oral Oral Oral Oral  SpO2: 99% 95% 94% 96%    General: Pt lying comfortably in bed & appears in no obvious distress.  Elderly female, moderately built and obese lying comfortably supine.  Oral mucosa moist. Cardiovascular: S1 & S2 heard, RRR, S1/S2 +. No murmurs, rubs, gallops or clicks. No JVD or pedal edema. Respiratory: Clear to auscultation without wheezing, rhonchi or crackles. No increased work of breathing. Abdominal:  Non distended, non tender & soft. No organomegaly or masses appreciated. Normal bowel sounds heard. CNS: Alert and oriented. No focal deficits. Extremities: no edema, no cyanosis.  Limited right lower extremity movements which is flexed at the knee,?  Unclear if she has contractures.  Right hip side surgical scar has healed without acute issues. Skin: Diffuse fine papular skin rash over the upper extremities, thighs and mid anterior chest.  Seem to have improved.    The results of significant diagnostics from this hospitalization (including imaging, microbiology, ancillary and laboratory) are listed below for reference.     Microbiology: Recent Results (from the past  240 hour(s))  Blood culture (routine x 2)     Status: None (Preliminary result)   Collection Time: 01/10/23  7:58 PM   Specimen: BLOOD  Result Value Ref Range Status   Specimen Description   Final    BLOOD LEFT ANTECUBITAL Performed at [facility], [address], [place], [place] [address]  Special Requests   Final    BOTTLES DRAWN AEROBIC AND ANAEROBIC Blood Culture results may not be optimal due to an excessive volume of blood received in culture bottles Performed at [facility], [address], [place], [place] [address]    Culture   Final    NO GROWTH 2 DAYS Performed at [facility], [address], [place], [place] [address]    Report Status PENDING  Incomplete  Blood culture (routine x 2)     Status: None (Preliminary result)   Collection Time: 01/10/23  8:15 PM   Specimen: BLOOD LEFT FOREARM  Result Value Ref Range Status   Specimen Description   Final    BLOOD LEFT FOREARM Performed at [facility], [address], [place], [place] [address]    Special Requests   Final    BOTTLES DRAWN AEROBIC AND ANAEROBIC Blood Culture results may not be optimal due to an excessive volume of blood received in culture bottles Performed at [facility], [address] [place], [place] [address]    Culture   Final    NO GROWTH 2 DAYS Performed at [facility], [address] [place], [place] [address]    Report Status PENDING  Incomplete  Urine Culture     Status: Abnormal   Collection Time: 01/10/23  8:29 PM   Specimen: Urine, Clean Catch  Result Value Ref Range Status   Specimen Description   Final    URINE, CLEAN CATCH Performed at [facility], [address], [place], [place] [address]    Special Requests   Final    NONE Performed at [facility], [address], [place], [place] [address]    Culture >=100,000 COLONIES/mL ESCHERICHIA COLI (A)  Final    Report Status 01/12/2023 FINAL  Final   Organism ID, Bacteria ESCHERICHIA COLI (A)  Final      Susceptibility   Escherichia coli - MIC*    AMPICILLIN >=32 RESISTANT Resistant     CEFAZOLIN <=4 SENSITIVE Sensitive     CEFEPIME <=0.12 SENSITIVE Sensitive     CEFTRIAXONE <=0.25 SENSITIVE Sensitive     CIPROFLOXACIN >=4 RESISTANT Resistant     GENTAMICIN <=1 SENSITIVE Sensitive     IMIPENEM <=0.25 SENSITIVE Sensitive     NITROFURANTOIN <=16 SENSITIVE Sensitive     TRIMETH/SULFA >=320 RESISTANT Resistant     AMPICILLIN/SULBACTAM >=32 RESISTANT Resistant     PIP/TAZO <=4 SENSITIVE Sensitive     * >=100,000 COLONIES/mL ESCHERICHIA COLI     Labs: CBC: Recent Labs  Lab 01/06/23 0000 01/10/23 0000 01/10/23 1938 01/11/23 0500  WBC 6.1 6.0 6.5 6.1  NEUTROABS 3,709.00 3,168.00 3.6  --   HGB 11.3* 10.6* 11.8* 10.8*  HCT 36 33* 39.4 35.3*  MCV  --   --  89.5 89.1  PLT 280 244 272 230    Basic Metabolic Panel: Recent Labs  Lab 01/10/23 0000 01/10/23 1938 01/11/23 0500  NA 138 135 136  K 4.1 3.8 3.4*  CL 108 107 108  CO2 21 21* 21*  GLUCOSE  --  105* 87  BUN 32* 31* 29*  CREATININE 2.1* 2.10* 1.95*  CALCIUM 8.8 8.7* 8.4*    Liver Function Tests: Recent Labs  Lab 01/10/23 0000 01/10/23 1938  AST 15 21  ALT 10 17  ALKPHOS 83 87  BILITOT  --  0.6  PROT  --  7.3  ALBUMIN 3.3* 3.5  Thyroid function studies Recent Labs    01/10/23 1938  TSH 2.119    Urinalysis    Component Value Date/Time   COLORURINE STRAW (A) 01/10/2023 2029   APPEARANCEUR CLEAR 01/10/2023 2029   LABSPEC 1.004 (L) 01/10/2023 2029   PHURINE 6.0 01/10/2023 2029   GLUCOSEU NEGATIVE 01/10/2023 2029   HGBUR NEGATIVE 01/10/2023 2029   BILIRUBINUR NEGATIVE 01/10/2023 2029   KETONESUR NEGATIVE 01/10/2023 2029   PROTEINUR NEGATIVE 01/10/2023 2029   UROBILINOGEN 0.2 12/30/2009 1134   NITRITE NEGATIVE 01/10/2023 2029   LEUKOCYTESUR SMALL (A) 01/10/2023 2029   Discussed in detail with  patient's spouse at bedside.   Time coordinating discharge: 35 minutes  SIGNED:  Marcellus Scott, MD,  FACP, [facility], [facility], [facility]   [facility] [place]     To contact the attending provider between 7A-7P or the covering provider during after hours 7P-7A, please log into the web site [email or web address] and access using universal [place] password for that web site. If you do not have the password, please call the hospital operator.

## 2023-01-12 NOTE — Progress Notes (Signed)
Telephone report given to Steward Drone at [facility]. Report included medications, vital signs, new rash and treatment for it, and bowel activities this morning (frequent stools - patient states "I am not taking my Align here and it always helps me").

## 2023-01-12 NOTE — Progress Notes (Signed)
Location:  [facility] Room Number: N056-A Place of Service:  SNF [phone]) Provider:  Chipper Oman, NP   Patient Care Team: Heffner, Jimmye Norman, MD as PCP - General (Family Medicine) [name], MD as PCP - Cardiology (Cardiology) [name], MD as PCP - Sleep Medicine (Cardiology) [name], MD (Sports Medicine) [name], MD (Ophthalmology) [name], [name], MD (Dermatology) [name], MD (Gastroenterology) [name], MD (Nephrology) [name], DC as Referring Physician (Chiropractic Medicine)  Extended Emergency Contact Information Primary Emergency Contact: [facility] Address: [address]          [place], [place] [address] [name] of [place] Home Phone: [phone] Mobile Phone: [phone] Relation: Spouse  Code Status:  DNR Goals of care: Advanced Directive information    01/13/2023    4:13 PM  Advanced Directives  Does Patient Have a Medical Advance Directive? Yes  Type of Advance Directive Living will;Out of facility DNR (pink MOST or yellow form)  Does patient want to make changes to medical advance directive? No - Patient declined  Pre-existing out of facility DNR order (yellow form or pink MOST form) Yellow form placed in chart (order not valid for inpatient use)     Chief Complaint  Patient presents with   Acute Visit    Emergency room follow-up    Error    HPI:  Pt is a 82 y.o. female seen today for an acute visit for    Past Medical History:  Diagnosis Date   Benign hypertension with chronic kidney disease, stage III (HCC)    Overview:  Last Assessment & Plan:  Usually the patient is hypertensive. However today her blood pressure is 105 systolic. She has been feeling fatigued with some lightheadedness. Part of this may be related to her lower blood pressure. Her pressure may be improved with her decrease in salt and fluid intake. I've instructed her to put  her lisinopril/hctz on hold. 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 It was possibly reentrant. She does well with diltiazem. No change in therapy.   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   Thyroid disease    Urinary incontinence    Past Surgical History:  Procedure Laterality Date   CATARACT EXTRACTION Left 8/11   CATARACT EXTRACTION Right 3/12   CHOLECYSTECTOMY  1994   Copsilotomy Laser Treatment Left 6/12   eye   ELBOW SURGERY  8/12   EYE SURGERY Left 09/2008   macular hole   EYE SURGERY Right 12/11   Lumbar Infusion  2011   MOLE REMOVAL  06/17/2015   TOTAL KNEE ARTHROPLASTY Left 6/11   TOTAL KNEE ARTHROPLASTY Right 06/13/2011    Allergies  Allergen Reactions   Keflex [Cephalexin] Diarrhea   Macrobid [Nitrofurantoin] Hives   Amoxicillin Rash   Cefadroxil Rash    Outpatient Encounter Medications as of 01/12/2023  Medication Sig   albuterol (VENTOLIN HFA) 108 (90 Base) MCG/ACT inhaler Inhale 2 puffs into the lungs every 6 (six) hours as needed for wheezing or shortness of breath.   bifidobacterium infantis (ALIGN) capsule Take 1 capsule by  mouth daily.   Calcium Carb-Cholecalciferol (CALCIUM PLUS VITAMIN D3 PO) Take 1 tablet by mouth daily. Vitamin D3 + Calcium 600   Cholecalciferol (VITAMIN D3) 50 MCG (2000 UT) TABS Take 2 tablets by mouth daily.   ferrous sulfate 325 (65 FE) MG EC tablet Take 325 mg by mouth as directed. Once A Day on Mon, Thu   furosemide (LASIX) 20 MG tablet Take 20 mg by mouth daily.   HYDROcodone-acetaminophen (NORCO/VICODIN) 5-325 MG tablet Take 1 tablet by mouth in the morning and at bedtime.   ipratropium-albuterol (DUONEB) 0.5-2.5 (3) MG/3ML SOLN Inhale 3 mLs into the lungs every 8 (eight) hours as needed (wheezing).   levothyroxine (SYNTHROID) 25 MCG tablet Take 25 mcg by mouth as directed. Take 1 tablet (25 mcg) along with 75 mcg=100 mcg   levothyroxine (SYNTHROID) 75 MCG tablet Take 75 mcg by mouth as directed. Take 1 tablet (75 mcg) along with 25 mcg=100 mcg   loperamide (IMODIUM) 2 MG capsule Take 2 mg by mouth every 4 (four) hours as needed for diarrhea or loose stools.   nystatin (MYCOSTATIN/NYSTOP) powder Apply 1 application  topically in the morning, at noon, in the evening, and at bedtime. To genital areas and inner thigh   nystatin cream (MYCOSTATIN) Apply 1 Application topically at bedtime.   ondansetron (ZOFRAN) 4 MG tablet Take 4 mg by mouth every 8 (eight) hours as needed for nausea or vomiting.   pantoprazole (PROTONIX) 40 MG tablet Take 40 mg by mouth daily.   triamcinolone cream (KENALOG) 0.5 % Apply 1 Application topically at bedtime.   [DISCONTINUED] cefadroxil (DURICEF) 500 MG capsule Take 500 mg by mouth 2 (two) times daily. For UTI x 7 days   [DISCONTINUED] metroNIDAZOLE (FLAGYL) 500 MG tablet Take 1 tablet (500 mg total) by mouth every 12 (twelve) hours for 7 days.   [DISCONTINUED] ciprofloxacin (CIPRO) 500 MG tablet Take 1 tablet (500 mg total) by mouth 2 (two) times daily for 6 days.   [DISCONTINUED] methocarbamol (ROBAXIN) 750 MG tablet Take 750 mg by mouth every 6 (six)  hours as needed for muscle spasms.   [EXPIRED] fosfomycin (MONUROL) packet 3 g    [DISCONTINUED] acetaminophen (TYLENOL) suppository 650 mg    [DISCONTINUED] acetaminophen (TYLENOL) tablet 650 mg    [DISCONTINUED] ciprofloxacin (CIPRO) tablet 500 mg    [DISCONTINUED] diphenhydrAMINE (BENADRYL) 12.5 MG/5ML elixir 25 mg    [DISCONTINUED] furosemide (LASIX) tablet 20 mg    [DISCONTINUED] heparin injection 5,000 Units    [DISCONTINUED] ipratropium-albuterol (DUONEB) 0.5-2.5 (3) MG/3ML nebulizer solution 3 mL    [DISCONTINUED] levothyroxine (SYNTHROID) tablet 100 mcg    [DISCONTINUED] methocarbamol (ROBAXIN) tablet 750 mg    [DISCONTINUED] metroNIDAZOLE (FLAGYL) tablet 500 mg    [DISCONTINUED] ondansetron (ZOFRAN) injection 4 mg    [DISCONTINUED] ondansetron (ZOFRAN) tablet 4 mg    [DISCONTINUED] pantoprazole (PROTONIX) EC tablet 40 mg    [DISCONTINUED] senna-docusate (Senokot-S) tablet 1 tablet    No facility-administered encounter medications  on file as of 01/12/2023.    Review of Systems  Immunization History  Administered Date(s) Administered   Influenza Split 01/28/2008, 02/17/2009, 02/20/2012, 02/11/2013, 02/02/2016, 01/11/2017, 02/04/2019   Influenza, High Dose Seasonal PF 02/04/2015, 01/11/2017, 01/30/2018, 02/05/2020   Influenza-Unspecified 02/16/2022   Moderna SARS-COV2 Booster Vaccination 02/24/2022   Moderna Sars-Covid-2 Vaccination 05/09/2019, 05/27/2019, 03/03/2020, 09/04/2020, 11/04/2020   Pfizer Covid-19 Vaccine Bivalent Booster 38yrs & up 02/09/2021   Pneumococcal Conjugate-13 08/30/2013   Pneumococcal Polysaccharide-23 11/03/2005   RSV,unspecified 04/22/2022   Td 08/01/2001, 09/05/2019   Tdap 09/22/2010, 11/24/2020   Zoster Recombinant(Shingrix) 11/08/2016, 01/11/2017   Zoster, Live 11/04/2005, 11/08/2016, 01/11/2017   Zoster, Unspecified 01/11/2017   Pertinent  Health Maintenance Due  Topic Date Due   INFLUENZA VACCINE  11/24/2022   DEXA SCAN  Completed       04/15/2022   10:15 AM 04/27/2022   11:35 AM 05/31/2022    9:34 AM 07/26/2022    9:31 AM 10/21/2022    9:31 AM  Fall Risk  Falls in the past year? 0 0 0 0 0  Was there an injury with Fall? 0 0 0 0 0  Fall Risk Category Calculator 0 0 0 0 0  Fall Risk Category (Retired) Low Low     (RETIRED) Patient Fall Risk Level Moderate fall risk Moderate fall risk     Patient at Risk for Falls Due to History of fall(s) History of fall(s) History of fall(s)  No Fall Risks  Fall risk Follow up Falls evaluation completed Falls evaluation completed Falls evaluation completed  Falls evaluation completed   Functional Status Survey:    Vitals:   01/12/23 1011  BP: (!) 141/76  Pulse: 65  Weight: 281 lb (127.5 kg)  Height: 5\' 8"  (1.727 m)   Body mass index is 42.73 kg/m. Physical Exam  Labs reviewed: Recent Labs    07/04/22 0502 07/05/22 0538 07/06/22 0702 07/08/22 1714 07/12/22 0437 07/13/22 0412 10/19/22 0339 10/25/22 0000 01/10/23 0000 01/10/23 1938 01/11/23 0500  NA 134* 135 136   < > 133*   < > 134*   < > 138 135 136  K 4.2 3.7 3.4*   < > 3.0*   < > 3.7   < > 4.1 3.8 3.4*  CL 104 106 109   < > 102   < > 107   < > 108 107 108  CO2 20* 19* 19*   < > 20*   < > 21*   < > 21 21* 21*  GLUCOSE 96 94 106*   < > 113*   < > 88  --   --  105* 87  BUN 25* 24* 20   < > 20   < > 42*   < > 32* 31* 29*  CREATININE 1.21* 1.14* 1.01*   < > 1.21*   < > 2.04*   < > 2.1* 2.10* 1.95*  CALCIUM 8.2* 8.4* 8.4*   < > 7.7*   < > 7.9*   < > 8.8 8.7* 8.4*  MG 2.4 2.4 2.4  --  1.8  --   --   --   --   --   --   PHOS 3.6 4.3 3.6  --   --   --   --   --   --   --   --    < > = values in this interval not displayed.   Recent Labs    07/06/22 0702 07/21/22 0000 10/13/22 1855  11/15/22 0000 01/10/23 0000 01/10/23 1938  AST 36   < > 20 19 15 21   ALT 25   < > 17 12 10 17   ALKPHOS 63   < > 85 84 83 87  BILITOT 0.3  --  1.1  --   --  0.6  PROT 6.4*  --  7.0  --   --  7.3  ALBUMIN 2.3*   < > 2.7* 3.3* 3.3* 3.5    < > = values in this interval not displayed.   Recent Labs    10/19/22 0339 11/15/22 0000 01/06/23 0000 01/10/23 0000 01/10/23 1938 01/11/23 0500  WBC 11.9*   < > 6.1 6.0 6.5 6.1  NEUTROABS  --    < > 3,709.00 3,168.00 3.6  --   HGB 11.5*   < > 11.3* 10.6* 11.8* 10.8*  HCT 37.1   < > 36 33* 39.4 35.3*  MCV 89.6  --   --   --  89.5 89.1  PLT 261   < > 280 244 272 230   < > = values in this interval not displayed.   Lab Results  Component Value Date   TSH 2.119 01/10/2023   No results found for: "HGBA1C" No results found for: "CHOL", "HDL", "LDLCALC", "LDLDIRECT", "TRIG", "CHOLHDL"  Significant Diagnostic Results in last 30 days:  CT ABDOMEN PELVIS WO CONTRAST  Result Date: 01/10/2023 CLINICAL DATA:  Left lower quadrant pain. UTI detected on outside urinalysis but she also has a history of diverticulitis. EXAM: CT ABDOMEN AND PELVIS WITHOUT CONTRAST TECHNIQUE: Multidetector CT imaging of the abdomen and pelvis was performed following the standard protocol without IV contrast. RADIATION DOSE REDUCTION: This exam was performed according to the departmental dose-optimization program which includes automated exposure control, adjustment of the mA and/or kV according to patient size and/or use of iterative reconstruction technique. COMPARISON:  Limited comparison is available with a CTA chest 07/02/2022 and postmyelogram CT lumbar spine dated 04/06/2020. There is no prior abdominopelvic dedicated imaging. FINDINGS: Lower chest: Lung bases demonstrate scattered linear scarring or atelectasis without infiltrates. Interval resolution of the small pleural effusions noted on 07/02/2022. A moderate-sized hiatal hernia is again shown. The cardiac size is normal. There are calcifications in the right coronary artery. No pericardial fluid. Hepatobiliary: The liver is 21 cm length and mildly steatotic. No mass is seen without contrast. Gallbladder is absent and there is no biliary dilatation. Pancreas:  The gland is moderately atrophic. No masses seen without contrast or ductal dilatation. Spleen: Unremarkable without contrast.  No splenomegaly. Adrenals/Urinary Tract: No adrenal mass. Cortical thinning noted somewhat in both kidneys. Small length measurement of the right kidney of 7.4 cm with low-normal length measurement on the left at 9 cm. There is no contour deforming abnormality of either kidney. There is no urinary stone or obstruction. Small bilateral extrarenal pelves are again shown. There is no thickening of the bladder wall. Stomach/Bowel: Moderate-sized hiatal hernia. No dilatation or wall thickening including the appendix. There is diffuse diverticulosis distal transverse and left-sided colon. There are faint stranding changes along side the distal third of the descending colon which would either represent a mild uncomplicated diverticulitis or fat scarring from prior diverticular disease. A mild diverticulitis is favored in this case. There is no diverticular abscess or free air. Vascular/Lymphatic: Aortic atherosclerosis. No enlarged abdominal or pelvic lymph nodes. Reproductive: Uterus and bilateral adnexa are unremarkable. Other: No free fluid, free air, free hemorrhage or incarcerated hernia. Small umbilical fat hernia. Musculoskeletal:  Old spinous process fusion hardware L3-4 and L4-5 with dorsal decompression at L5-S1 and transitional anatomy with partial lumbarization of S1. Multilevel degenerative discs with vacuum phenomenon. Spondylosis. There is chronic wedging of L1 and 3, chronic and unchanged minimal retrolisthesis at L1-2, minimal anterolisthesis at L3-4, L4-5 and L5-S1, with multilevel acquired foraminal stenosis. Comparison is also made to the right hip CT from 07/11/2022. Redemonstration of removal of hardware from the prior right hip arthroplasty is again noted, similar appearance of fluid and granulation tissue in the right hip bed with again noted cephalad migration of the right  femoral shaft into the area. There is healed fracture deformity along the right acetabular roof with acetabular remodeling. All of these findings were present previously and unchanged. No destructive osseous lesion is evident or new abnormality. There is chronic atrophy in the dorsal paraspinous muscles and right iliopsoas and gluteal musculature. IMPRESSION: 1. Diffuse left-sided colonic diverticulosis, with faint stranding changes alongside the distal third of the descending colon which would either represent a mild uncomplicated diverticulitis or fat scarring from prior diverticular disease. A mild diverticulitis is favored in this case. No diverticular abscess or free air. 2. No urinary stone or obstruction. 3. Cortical thinning in both kidneys with small right kidney and low-normal left kidney length. No urinary stone or obstruction. No bladder thickening. 4. Aortic and coronary artery atherosclerosis. 5. Moderate-sized hiatal hernia. 6. Mildly prominent liver with mild steatosis. 7. Small umbilical fat hernia. 8. Chronic changes in the right hip with removal of hardware from the prior right hip arthroplasty, and right-sided muscular atrophy described above. 9. Degenerative and chronic postsurgical changes lumbar spine with osteopenia and chronic wedging of L1 and 3. Aortic Atherosclerosis (ICD10-I70.0). Electronically Signed   By: Almira Bar M.D.   On: 01/10/2023 21:40    Assessment/Plan There are no diagnoses linked to this encounter.   Family/ staff Communication:   Labs/tests ordered:   This encounter was created in error - please disregard.

## 2023-01-12 NOTE — Plan of Care (Signed)
Problem: Elimination: Goal: Will not experience complications related to bowel motility Outcome: Progressing   Problem: Safety: Goal: Ability to remain free from injury will improve Outcome: Progressing   Problem: Skin Integrity: Goal: Risk for impaired skin integrity will decrease Outcome: Progressing

## 2023-01-12 NOTE — Progress Notes (Signed)
TRH Progress Note  Patient's discharge yesterday was held due to allergic reaction to cefadroxil.  Updated events since yesterday onto discharge summary today.  Kindly refer to the discharge summary for details.  Patient being discharged to SNF today.  Marcellus Scott, MD,  FACP, [facility], [facility], [facility]   [facility] [place]     To contact the attending provider between 7A-7P or the covering provider during after hours 7P-7A, please log into the web site [email or web address] and access using universal [place] password for that web site. If you do not have the password, please call the hospital operator.

## 2023-01-12 NOTE — TOC Progression Note (Signed)
Transition of Care [facility]) - Progression Note    Patient Details  Name: Madison [name] MRN: [id number] Date of Birth: 13-Jul-1940  [facility] CM/SW Contact  Harriett Sine, RN Phone Number: 01/12/2023, 10:37 AM  Clinical Narrative:    Spoke with pt at bedside about d/c and rash. Equities trader at [employer] left a message about d/c and rash.  TOC following     Barriers to Discharge: No Barriers Identified  Expected Discharge Plan and Services         Expected Discharge Date: 01/11/23                                     Social Determinants of Health (SDOH) Interventions SDOH Screenings   Food Insecurity: No Food Insecurity (01/11/2023)  Housing: Low Risk  (01/11/2023)  Transportation Needs: No Transportation Needs (01/11/2023)  Utilities: Not At Risk (01/11/2023)  Depression (PHQ2-9): Low Risk  (07/26/2022)  Financial Resource Strain: Low Risk  (01/16/2021)   Received from [facility], [facility]  Social Connections: Unknown (08/28/2021)   Received from [facility], [facility]  Stress: No Stress Concern Present (01/13/2021)   Received from [facility], [facility]  Tobacco Use: Medium Risk (01/12/2023)    Readmission Risk Interventions    10/17/2022   12:09 PM  Readmission Risk Prevention Plan  Transportation Screening Complete  Home Care Screening Complete  Medication Review (RN CM) Complete

## 2023-01-13 ENCOUNTER — Encounter: Payer: Self-pay | Admitting: Sports Medicine

## 2023-01-13 ENCOUNTER — Non-Acute Institutional Stay (SKILLED_NURSING_FACILITY): Payer: Medicare Other | Admitting: Sports Medicine

## 2023-01-13 DIAGNOSIS — I5032 Chronic diastolic (congestive) heart failure: Secondary | ICD-10-CM | POA: Diagnosis not present

## 2023-01-13 DIAGNOSIS — N390 Urinary tract infection, site not specified: Secondary | ICD-10-CM

## 2023-01-13 DIAGNOSIS — K5792 Diverticulitis of intestine, part unspecified, without perforation or abscess without bleeding: Secondary | ICD-10-CM | POA: Diagnosis not present

## 2023-01-13 DIAGNOSIS — N184 Chronic kidney disease, stage 4 (severe): Secondary | ICD-10-CM

## 2023-01-13 DIAGNOSIS — L270 Generalized skin eruption due to drugs and medicaments taken internally: Secondary | ICD-10-CM

## 2023-01-13 DIAGNOSIS — E039 Hypothyroidism, unspecified: Secondary | ICD-10-CM

## 2023-01-13 NOTE — Progress Notes (Unsigned)
Provider:   Venita [name] Location:   [facility]   Place of Service:   [facility]  PCP: Eulis Foster Jimmye Norman, MD Madison [name] Care Team: Eulis Foster Jimmye Norman, MD as PCP - General (Family Medicine) [name], MD as PCP - Cardiology (Cardiology) [name], MD as PCP - Sleep Medicine (Cardiology) [name], MD (Sports Medicine) [name], MD (Ophthalmology) [name], [name], MD (Dermatology) [name], MD (Gastroenterology) [name], MD (Nephrology) [name], DC as Referring Physician (Chiropractic Medicine)  Extended Emergency Contact Information Primary Emergency Contact: [facility] Address: [address]          [place], [place] [address] [name] of [place] Home Phone: [phone] Mobile Phone: [phone] Relation: Spouse  Code Status:  Goals of Care: Advanced Directive information    01/12/2023   10:14 AM  Advanced Directives  Does Madison [name] Have a Medical Advance Directive? Yes  Type of Advance Directive Living will;Out of facility DNR (pink MOST or yellow form)  Does Madison [name] want to make changes to medical advance directive? No - Madison [name] declined  Pre-existing out of facility DNR order (yellow form or pink MOST form) Yellow form placed in chart (order not valid for inpatient use)      No chief complaint on file.   HPI: Madison [name] is a 82 y.o. female seen today for readmission to SNF after recent hospitalization. Pt seen and examined in her room, husband at their bedside Pt denies abdominal pain, nausea, vomiting.  Pt reports that she had diarrhea yesterday while she was in the hospital but none since this morning  No BM today  Denies blood or dark stools She had her breakfast this morning and tolerated well Denies dysuria, lower abdominal pain   Rash - Pt had allergic reaction to cefadroxil  Pt states she is having itching but new rash  Its fading  Denies lip swelling, tongue  swelling , sob.    Discharge Diagnoses:  Principal Problem:   Acute diverticulitis Active Problems:   UTI (urinary tract infection)   Chronic heart failure with preserved ejection fraction (HFpEF) (HCC)   CKD (chronic kidney disease) stage 4, GFR 15-29 ml/min (HCC)   Acquired hypothyroidism   Absence of hip joint, right  Follow-up Information       MD at SNF Follow up.   Why: To be seen in 2 to 3 days with repeat labs (CBC & BMP).  Madison [name] to keep up upcoming nephrology appointment next Monday.       Hospital discharge summary  Acute diverticulitis: Appears mild on CT imaging without evidence of abscess or free air.  Abdominal pain improved. Briefly treated with IV Zosyn in the hospital which was initially transitioned to oral cefadroxil and Flagyl.  However Madison [name] had allergic reaction to cefadroxil including pruritic skin rash.  Cefadroxil was discontinued and added to her allergy list.   On day of discharge 01/12/2023, reviewed case again in detail with infectious disease MD on-call Dr. Ninetta Lights and as per recommendations, changed antibiotics to Cipro + Flagyl to complete total 7 days course. Madison [name] reports that she was up to date on her colonoscopies and maybe her last colonoscopy was in 2018 by Dr. Vivien Rossetti [facility] and she feels that maybe she has aged out of need for further colonoscopies and Dr. Matthias Hughs retired. Defer decision regarding further colonoscopies to her PCP during outpatient follow-up.   Asymptomatic E. coli bacteriuria versus less likely urinary tract infection: Diagnosed at her outside facility,  documentation from SNF "Urine culture showed E Coli>100,000c/ml, susceptible to Cefepime, Deftazidime, Ceftriaxone, Gentamicin, Imipenem, Nitrofurantoin, Tobramycin -In the hospital she received 3 doses of IV Zosyn. -As noted above, Madison [name] lacks UTI symptoms but per report, she usually does not have any even when she had a urinary tract infection in the past.  Her left  lower quadrant abdominal pain may be explained by the acute mild diverticulitis.  Urine microscopy is really not impressive for UTI.  Bladder wall not thickened on CT abdomen. Urine culture does show >100 K colonies of E. coli, resistant to ampicillin, ciprofloxacin, Bactrim and ampicillin/sulbactam. -On 01/11/2023, Madison [name]'s spouse expressed his concern regarding history of recurrent UTIs and in the past reportedly was admitted for several weeks at [facility] due to infected right hip, which was told to be due to UTI, so anytime the UTIs are mentioned, they obviously get worried.  I assured him that in my clinical opinion, this does not appear to be a UTI, even if it is then it is mild and above antibiotics should take care of it.  Furthermore, I personally discussed with infectious disease MD on-call yesterday and today regarding management is noted.  Despite low index of suspicion for a true UTI, completing treatment for same. As per ID pharmacist, "there's a scanned ucx report from SNF in the media tab in EPIC. The Ecoli is I= augmentin and unasyn; R= Levaquin, bactrim.    Addendum: As noted above, after initial IV Zosyn, was switched to to cefadroxil briefly on 9/18 with plans to DC and after the first dose of cefadroxil, she developed pruritic skin rash.  Likely a reaction to cefadroxil.  This was discontinued and added to her allergy list. Communicated extensively with infectious disease MD on-call, Dr. Ninetta Lights on 9/19 and as per recommendations, completed treatment of E. coli UTI with a single dose of fosfomycin.   CKD stage IV: Creatinine 2.10 on admission.  Appears to have progressed to CKD stage IV since June.  Creatinine improved to 1.9 which appears to be the baseline at least since July 2024.  Follow-up with nephrology as an outpatient.  Per spouse, Madison [name] has an upcoming first appointment with Dr. Signe Colt next Monday and is advised to keep that appointment.   Chronic HFpEF: Last EF  70-75% by TTE 07/09/2022.  Stable.  Continue oral Lasix 20 mg daily.   Hypothyroidism: Continue home dose Synthroid.   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  Fall precautions.  Right hip wound site appears to have healed well.  Madison [name] is nonambulatory and has significant restrictions of right lower extremity movements due to prior surgery and related pains.   Hypokalemia: Replaced prior to discharge.   Chronic anemia Stable.  Hemoglobin likely ranges at baseline between 10-11 g.  Outpatient follow-up.  Past Medical History:  Diagnosis Date   Benign hypertension with chronic kidney disease, stage III (HCC)    Overview:  Last Assessment & Plan:  Usually the Madison [name] is hypertensive. However today her blood pressure is 105 systolic. She has been feeling fatigued with some lightheadedness. Part of this may be related to her lower blood pressure. Her pressure may be improved with her decrease in salt and fluid intake. I've instructed her to put her lisinopril/hctz on hold. I've asked her to see her primary    Cardiac disease 03/10/2014   Chronic diarrhea 07/27/2015   Edema 07/27/2015   Ejection fraction 2004  Normal, echo,    Gastroesophageal reflux disease    GERD (gastroesophageal reflux disease)    Heart disease 03/10/2014   Hypertension    Left bundle branch block 09/01/2020   Obesity    Obesity (BMI 30-39.9) 09/25/2017   OSA (obstructive sleep apnea)    mild with AHI 6.75 - on CPAP   Preop cardiovascular exam 11/2010   Cardiac clearance for knee surgery    Sleep apnea 03/10/2014   Supraventricular tachycardia    Documented episode in the past, possibly reentrant tachycardia  Overview:  Overview:  Documented episode in the past, possibly reentrant tachycardia  Last Assessment & Plan:  The Madison [name] had a documented episode in the past of a supraventricular tachycardia. It was possibly  reentrant. She does well with diltiazem. No change in therapy.   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   Thyroid disease    Urinary incontinence    Past Surgical History:  Procedure Laterality Date   CATARACT EXTRACTION Left 8/11   CATARACT EXTRACTION Right 3/12   CHOLECYSTECTOMY  1994   Copsilotomy Laser Treatment Left 6/12   eye   ELBOW SURGERY  8/12   EYE SURGERY Left 09/2008   macular hole   EYE SURGERY Right 12/11   Lumbar Infusion  2011   MOLE REMOVAL  06/17/2015   TOTAL KNEE ARTHROPLASTY Left 6/11   TOTAL KNEE ARTHROPLASTY Right 06/13/2011    reports that she has quit smoking. She has never been exposed to tobacco smoke. She has never used smokeless tobacco. She reports that she does not drink alcohol and does not use drugs. Social History   Socioeconomic History   Marital status: Married    Spouse name: Not on file   Number of children: Not on file   Years of education: Not on file   Highest education level: Not on file  Occupational History   Not on file  Tobacco Use   Smoking status: Former    Passive exposure: Never   Smokeless tobacco: Never  Vaping Use   Vaping status: Never Used  Substance and Sexual Activity   Alcohol use: No   Drug use: No   Sexual activity: Not Currently  Other Topics Concern   Not on file  Social History Narrative   Not on file   Social Determinants of Health   Financial Resource Strain: Low Risk  (01/16/2021)   Received from [facility], [facility]   Overall Financial Resource Strain (CARDIA)    Difficulty of Paying Living Expenses: Not hard at all  Food Insecurity: No Food Insecurity (01/11/2023)   Hunger Vital Sign    Worried About Running Out of Food in the Last Year: Never true    Ran Out of Food in the Last Year: Never true  Transportation Needs: No Transportation Needs (01/11/2023)   PRAPARE - Administrator, Civil Service (Medical): No     Lack of Transportation (Non-Medical): No  Physical Activity: Not on file  Stress: No Stress Concern Present (01/13/2021)   Received from [facility], [facility] - Occupational Stress Questionnaire    Feeling of Stress : Not at all  Social Connections: Unknown (08/28/2021)   Received from [facility], [facility]   Social Network    Social Network: Not on file  Intimate Partner Violence: Not At Risk (01/11/2023)   Humiliation, Afraid, Rape, and Kick questionnaire    Fear  of Current or Ex-Partner: No    Emotionally Abused: No    Physically Abused: No    Sexually Abused: No    Functional Status Survey:    Family History  Problem Relation Age of Onset   High blood pressure Mother    Diabetes Mother    Heart Problems Father    Diabetes Father    Prostate cancer Father    Heart attack Father    Heart attack Paternal Grandmother    Heart attack Maternal Grandfather    Heart attack Paternal Uncle    Stroke Neg Hx     Health Maintenance  Topic Date Due   Medicare Annual Wellness (AWV)  11/12/2022   INFLUENZA VACCINE  11/24/2022   COVID-19 Vaccine (6 - 2023-24 season) 12/25/2022   DTaP/Tdap/Td (5 - Td or Tdap) 11/25/2030   Pneumonia Vaccine 23+ Years old  Completed   DEXA SCAN  Completed   Zoster Vaccines- Shingrix  Completed   HPV VACCINES  Aged Out    Allergies  Allergen Reactions   Keflex [Cephalexin] Diarrhea   Macrobid [Nitrofurantoin] Hives   Amoxicillin Rash   Cefadroxil Rash    Outpatient Encounter Medications as of 01/13/2023  Medication Sig   albuterol (VENTOLIN HFA) 108 (90 Base) MCG/ACT inhaler Inhale 2 puffs into the lungs every 6 (six) hours as needed for wheezing or shortness of breath.   bifidobacterium infantis (ALIGN) capsule Take 1 capsule by mouth daily.   Calcium Carb-Cholecalciferol (CALCIUM PLUS VITAMIN D3 PO) Take 1 tablet by mouth daily. Vitamin D3 + Calcium 600   Cholecalciferol (VITAMIN  D3) 50 MCG (2000 UT) TABS Take 2 tablets by mouth daily.   ciprofloxacin (CIPRO) 500 MG tablet Take 1 tablet (500 mg total) by mouth 2 (two) times daily for 6 days.   diphenhydrAMINE (BENADRYL) 12.5 MG/5ML elixir Take 10 mLs (25 mg total) by mouth every 6 (six) hours as needed for up to 3 days for allergies or itching.   ferrous sulfate 325 (65 FE) MG EC tablet Take 325 mg by mouth as directed. Once A Day on Mon, Thu   furosemide (LASIX) 20 MG tablet Take 20 mg by mouth daily.   HYDROcodone-acetaminophen (NORCO/VICODIN) 5-325 MG tablet Take 1 tablet by mouth in the morning and at bedtime.   ipratropium-albuterol (DUONEB) 0.5-2.5 (3) MG/3ML SOLN Inhale 3 mLs into the lungs every 8 (eight) hours as needed (wheezing).   levothyroxine (SYNTHROID) 25 MCG tablet Take 25 mcg by mouth as directed. Take 1 tablet (25 mcg) along with 75 mcg=100 mcg   levothyroxine (SYNTHROID) 75 MCG tablet Take 75 mcg by mouth as directed. Take 1 tablet (75 mcg) along with 25 mcg=100 mcg   loperamide (IMODIUM) 2 MG capsule Take 2 mg by mouth every 4 (four) hours as needed for diarrhea or loose stools.   metroNIDAZOLE (FLAGYL) 500 MG tablet Take 1 tablet (500 mg total) by mouth every 12 (twelve) hours for 6 days.   nystatin (MYCOSTATIN/NYSTOP) powder Apply 1 application  topically in the morning, at noon, in the evening, and at bedtime. To genital areas and inner thigh   nystatin cream (MYCOSTATIN) Apply 1 Application topically at bedtime.   ondansetron (ZOFRAN) 4 MG tablet Take 4 mg by mouth every 8 (eight) hours as needed for nausea or vomiting.   pantoprazole (PROTONIX) 40 MG tablet Take 40 mg by mouth daily.   triamcinolone cream (KENALOG) 0.5 % Apply 1 Application topically at bedtime.   No facility-administered encounter medications on  file as of 01/13/2023.    Review of Systems  Constitutional:  Negative for chills and fever.  HENT:  Negative for sinus pressure and sore throat.   Respiratory:  Negative for cough,  shortness of breath and wheezing.   Cardiovascular:  Negative for chest pain, palpitations and leg swelling.  Gastrointestinal:  Negative for abdominal distention, abdominal pain, blood in stool, constipation, diarrhea, nausea and vomiting.  Genitourinary:  Negative for dysuria, frequency and urgency.  Skin:  Positive for rash.  Neurological:  Negative for dizziness, weakness and numbness.  Psychiatric/Behavioral:  Negative for confusion.     There were no vitals filed for this visit. There is no height or weight on file to calculate BMI. Physical Exam Constitutional:      Appearance: Normal appearance.  HENT:     Head: Normocephalic and atraumatic.  Cardiovascular:     Rate and Rhythm: Normal rate and regular rhythm.     Heart sounds: No murmur heard. Pulmonary:     Effort: Pulmonary effort is normal. No respiratory distress.     Breath sounds: Normal breath sounds. No wheezing.  Abdominal:     General: Bowel sounds are normal. There is no distension.     Tenderness: There is no abdominal tenderness. There is no guarding or rebound.  Musculoskeletal:        General: No swelling or tenderness.  Skin:    Comments: Diffuse  papular rash on  upper chest , arms   Neurological:     Mental Status: She is alert. Mental status is at baseline.     Comments: Rt lower leg - unable to move since surgery  No recent change Left lower extremity - strength intact      Labs reviewed: Basic Metabolic Panel: Recent Labs    07/04/22 0502 07/05/22 0538 07/06/22 0702 07/08/22 1714 07/12/22 0437 07/13/22 0412 10/19/22 0339 10/25/22 0000 01/10/23 0000 01/10/23 1938 01/11/23 0500  NA 134* 135 136   < > 133*   < > 134*   < > 138 135 136  K 4.2 3.7 3.4*   < > 3.0*   < > 3.7   < > 4.1 3.8 3.4*  CL 104 106 109   < > 102   < > 107   < > 108 107 108  CO2 20* 19* 19*   < > 20*   < > 21*   < > 21 21* 21*  GLUCOSE 96 94 106*   < > 113*   < > 88  --   --  105* 87  BUN 25* 24* 20   < > 20   < >  42*   < > 32* 31* 29*  CREATININE 1.21* 1.14* 1.01*   < > 1.21*   < > 2.04*   < > 2.1* 2.10* 1.95*  CALCIUM 8.2* 8.4* 8.4*   < > 7.7*   < > 7.9*   < > 8.8 8.7* 8.4*  MG 2.4 2.4 2.4  --  1.8  --   --   --   --   --   --   PHOS 3.6 4.3 3.6  --   --   --   --   --   --   --   --    < > = values in this interval not displayed.   Liver Function Tests: Recent Labs    07/06/22 0702 07/21/22 0000 10/13/22 1855 11/15/22 0000 01/10/23 0000 01/10/23 1938  AST 36   < >  20 19 15 21   ALT 25   < > 17 12 10 17   ALKPHOS 63   < > 85 84 83 87  BILITOT 0.3  --  1.1  --   --  0.6  PROT 6.4*  --  7.0  --   --  7.3  ALBUMIN 2.3*   < > 2.7* 3.3* 3.3* 3.5   < > = values in this interval not displayed.   Recent Labs    01/10/23 1938  LIPASE 24   No results for input(s): "AMMONIA" in the last 8760 hours. CBC: Recent Labs    10/19/22 0339 11/15/22 0000 01/06/23 0000 01/10/23 0000 01/10/23 1938 01/11/23 0500  WBC 11.9*   < > 6.1 6.0 6.5 6.1  NEUTROABS  --    < > 3,709.00 3,168.00 3.6  --   HGB 11.5*   < > 11.3* 10.6* 11.8* 10.8*  HCT 37.1   < > 36 33* 39.4 35.3*  MCV 89.6  --   --   --  89.5 89.1  PLT 261   < > 280 244 272 230   < > = values in this interval not displayed.   Cardiac Enzymes: Recent Labs    07/06/22 0702  CKTOTAL 12*   BNP: Invalid input(s): "POCBNP" No results found for: "HGBA1C" Lab Results  Component Value Date   TSH 2.119 01/10/2023   Lab Results  Component Value Date   VITAMINB12 738 07/04/2022   Lab Results  Component Value Date   FOLATE 11.3 07/04/2022   Lab Results  Component Value Date   IRON 18 (L) 07/09/2022   TIBC 155 (L) 07/09/2022   FERRITIN 772 (H) 07/09/2022    Imaging and Procedures obtained prior to SNF admission: CT ABDOMEN PELVIS WO CONTRAST  Result Date: 01/10/2023 CLINICAL DATA:  Left lower quadrant pain. UTI detected on outside urinalysis but she also has a history of diverticulitis. EXAM: CT ABDOMEN AND PELVIS WITHOUT CONTRAST  TECHNIQUE: Multidetector CT imaging of the abdomen and pelvis was performed following the standard protocol without IV contrast. RADIATION DOSE REDUCTION: This exam was performed according to the departmental dose-optimization program which includes automated exposure control, adjustment of the mA and/or kV according to Madison [name] size and/or use of iterative reconstruction technique. COMPARISON:  Limited comparison is available with a CTA chest 07/02/2022 and postmyelogram CT lumbar spine dated 04/06/2020. There is no prior abdominopelvic dedicated imaging. FINDINGS: Lower chest: Lung bases demonstrate scattered linear scarring or atelectasis without infiltrates. Interval resolution of the small pleural effusions noted on 07/02/2022. A moderate-sized hiatal hernia is again shown. The cardiac size is normal. There are calcifications in the right coronary artery. No pericardial fluid. Hepatobiliary: The liver is 21 cm length and mildly steatotic. No mass is seen without contrast. Gallbladder is absent and there is no biliary dilatation. Pancreas: The gland is moderately atrophic. No masses seen without contrast or ductal dilatation. Spleen: Unremarkable without contrast.  No splenomegaly. Adrenals/Urinary Tract: No adrenal mass. Cortical thinning noted somewhat in both kidneys. Small length measurement of the right kidney of 7.4 cm with low-normal length measurement on the left at 9 cm. There is no contour deforming abnormality of either kidney. There is no urinary stone or obstruction. Small bilateral extrarenal pelves are again shown. There is no thickening of the bladder wall. Stomach/Bowel: Moderate-sized hiatal hernia. No dilatation or wall thickening including the appendix. There is diffuse diverticulosis distal transverse and left-sided colon. There are faint stranding changes along side the distal  third of the descending colon which would either represent a mild uncomplicated diverticulitis or fat scarring from  prior diverticular disease. A mild diverticulitis is favored in this case. There is no diverticular abscess or free air. Vascular/Lymphatic: Aortic atherosclerosis. No enlarged abdominal or pelvic lymph nodes. Reproductive: Uterus and bilateral adnexa are unremarkable. Other: No free fluid, free air, free hemorrhage or incarcerated hernia. Small umbilical fat hernia. Musculoskeletal: Old spinous process fusion hardware L3-4 and L4-5 with dorsal decompression at L5-S1 and transitional anatomy with partial lumbarization of S1. Multilevel degenerative discs with vacuum phenomenon. Spondylosis. There is chronic wedging of L1 and 3, chronic and unchanged minimal retrolisthesis at L1-2, minimal anterolisthesis at L3-4, L4-5 and L5-S1, with multilevel acquired foraminal stenosis. Comparison is also made to the right hip CT from 07/11/2022. Redemonstration of removal of hardware from the prior right hip arthroplasty is again noted, similar appearance of fluid and granulation tissue in the right hip bed with again noted cephalad migration of the right femoral shaft into the area. There is healed fracture deformity along the right acetabular roof with acetabular remodeling. All of these findings were present previously and unchanged. No destructive osseous lesion is evident or new abnormality. There is chronic atrophy in the dorsal paraspinous muscles and right iliopsoas and gluteal musculature. IMPRESSION: 1. Diffuse left-sided colonic diverticulosis, with faint stranding changes alongside the distal third of the descending colon which would either represent a mild uncomplicated diverticulitis or fat scarring from prior diverticular disease. A mild diverticulitis is favored in this case. No diverticular abscess or free air. 2. No urinary stone or obstruction. 3. Cortical thinning in both kidneys with small right kidney and low-normal left kidney length. No urinary stone or obstruction. No bladder thickening. 4. Aortic and  coronary artery atherosclerosis. 5. Moderate-sized hiatal hernia. 6. Mildly prominent liver with mild steatosis. 7. Small umbilical fat hernia. 8. Chronic changes in the right hip with removal of hardware from the prior right hip arthroplasty, and right-sided muscular atrophy described above. 9. Degenerative and chronic postsurgical changes lumbar spine with osteopenia and chronic wedging of L1 and 3. Aortic Atherosclerosis (ICD10-I70.0). Electronically Signed   By: Almira Bar M.D.   On: 01/10/2023 21:40    Assessment/Plan  1. Acute diverticulitis Pt tolerating Po food Cont with cipro and flagyl Denies abdominal pain, nausea, vomiting Cont with probiotics  2. Urinary tract infection without hematuria, site unspecified Asymptomatic  Cont with cipro  3. CKD (chronic kidney disease) stage 4, GFR 15-29 ml/min (HCC) Pt has appt with nephrology tomorrow    4. Chronic diastolic CHF (congestive heart failure) (HCC) Lungs clear  Cont with lasix   5. Acquired hypothyroidism Cont with synthroid   6. Drug rash Improving  Cont with benadryl     Family/ staff Communication: care plan discussed with the nurse  Labs/tests ordered: cbc, bmp ordered for next week   I spent greater than  45 minutes for the care of this Madison [name] in face to face time, chart review, clinical documentation, Madison [name] education.

## 2023-01-14 NOTE — Progress Notes (Deleted)
This encounter was created in error - please disregard.

## 2023-01-15 LAB — CULTURE, BLOOD (ROUTINE X 2)
Culture: NO GROWTH
Culture: NO GROWTH

## 2023-01-16 LAB — CBC AND DIFFERENTIAL
HCT: 34 — AB (ref 36–46)
Hemoglobin: 10.7 — AB (ref 12.0–16.0)
Neutrophils Absolute: 4770
Platelets: 225 10*3/uL (ref 150–400)
WBC: 7.5

## 2023-01-16 LAB — BASIC METABOLIC PANEL
BUN: 20 (ref 4–21)
CO2: 21 (ref 13–22)
Chloride: 108 (ref 99–108)
Creatinine: 2 — AB (ref 0.5–1.1)
Glucose: 89
Potassium: 3.4 meq/L — AB (ref 3.5–5.1)
Sodium: 136 — AB (ref 137–147)

## 2023-01-16 LAB — COMPREHENSIVE METABOLIC PANEL
Calcium: 8.6 — AB (ref 8.7–10.7)
eGFR: 24

## 2023-01-16 LAB — CBC: RBC: 3.94 (ref 3.87–5.11)

## 2023-01-18 ENCOUNTER — Encounter: Payer: Self-pay | Admitting: Nurse Practitioner

## 2023-01-18 ENCOUNTER — Non-Acute Institutional Stay (SKILLED_NURSING_FACILITY): Payer: Medicare Other | Admitting: Nurse Practitioner

## 2023-01-18 DIAGNOSIS — L298 Other pruritus: Secondary | ICD-10-CM

## 2023-01-18 DIAGNOSIS — K2100 Gastro-esophageal reflux disease with esophagitis, without bleeding: Secondary | ICD-10-CM

## 2023-01-18 DIAGNOSIS — T50905A Adverse effect of unspecified drugs, medicaments and biological substances, initial encounter: Secondary | ICD-10-CM

## 2023-01-18 DIAGNOSIS — E039 Hypothyroidism, unspecified: Secondary | ICD-10-CM

## 2023-01-18 DIAGNOSIS — I5032 Chronic diastolic (congestive) heart failure: Secondary | ICD-10-CM | POA: Diagnosis not present

## 2023-01-18 DIAGNOSIS — E871 Hypo-osmolality and hyponatremia: Secondary | ICD-10-CM

## 2023-01-18 DIAGNOSIS — B3731 Acute candidiasis of vulva and vagina: Secondary | ICD-10-CM

## 2023-01-18 DIAGNOSIS — L299 Pruritus, unspecified: Secondary | ICD-10-CM | POA: Insufficient documentation

## 2023-01-18 DIAGNOSIS — D649 Anemia, unspecified: Secondary | ICD-10-CM

## 2023-01-18 DIAGNOSIS — K5792 Diverticulitis of intestine, part unspecified, without perforation or abscess without bleeding: Secondary | ICD-10-CM

## 2023-01-18 DIAGNOSIS — M069 Rheumatoid arthritis, unspecified: Secondary | ICD-10-CM

## 2023-01-18 DIAGNOSIS — I10 Essential (primary) hypertension: Secondary | ICD-10-CM

## 2023-01-18 DIAGNOSIS — Z89621 Acquired absence of right hip joint: Secondary | ICD-10-CM

## 2023-01-18 DIAGNOSIS — N184 Chronic kidney disease, stage 4 (severe): Secondary | ICD-10-CM

## 2023-01-18 NOTE — Assessment & Plan Note (Addendum)
Monistat 3, no s/s of infection, but given extensive antibiotics use, will treat with Monistat

## 2023-01-18 NOTE — Progress Notes (Signed)
Location:   SNF [facility] Room Number: 59 Place of Service:  SNF (31) Provider: Arna Snipe Terica Yogi NP  Heffner, Jimmye Norman, MD  Patient Care Team: Heffner, Jimmye Norman, MD as PCP - General (Family Medicine) [name], MD as PCP - Cardiology (Cardiology) [name], MD as PCP - Sleep Medicine (Cardiology) [name], MD (Sports Medicine) [name], MD (Ophthalmology) [name], [name], MD (Dermatology) [name], MD (Gastroenterology) [name], MD (Nephrology) [name], DC as Referring Physician (Chiropractic Medicine)  Extended Emergency Contact Information Primary Emergency Contact: [facility] Address: [address]          [place], [place] [address] [name] of [place] Home Phone: [phone] Mobile Phone: [phone] Relation: Spouse  Code Status: DNR Goals of care: Advanced Directive information    01/13/2023    4:13 PM  Advanced Directives  Does Patient Have a Medical Advance Directive? Yes  Type of Advance Directive Living will;Out of facility DNR (pink MOST or yellow form)  Does patient want to make changes to medical advance directive? No - Patient declined  Pre-existing out of facility DNR order (yellow form or pink MOST form) Yellow form placed in chart (order not valid for inpatient use)     Chief Complaint  Patient presents with  . Acute Visit    Itching allover and in urogenital area     HPI:  Pt is a 82 y.o. female seen today for an acute visit for itching allover, macule spots seen arms and thighs, urogenital itching, noted redness, vaginal exam didn't reveal fishy odor, yellow or white discharge, the patient stated it does not itching if incontinent care is thorough and drys well afterwards.   Hospitalized 01/10/23-01/12/23 for UTI, diverticulitis, treated with Zosyn, Cefadroxil, Flagyl, developed pruritic rash, then ID changed to Cipro and Flagyl-complete 01/19/23, last  colonoscopy 2018. Has x1 Fosfomycin.  Hx of UTI, prosthetic joint infection 2022, cellulitis/abscess of the right hip after hardware removed 06/2022   Mid to lower back in back, consistently, with movement or sitting or in bed, Hx of muscle spasm, Robaxin was effective in the past. No noted injury, she is afebrile.                Hospitalized for AKI 10/13/22-10/19/22 for AKI, resumed Furosemide, Bun/creat 32/2.11 01/10/23              Hospitalized 07/08/22 for CHF, abscess of the right hip which previous infected, treated, and prosthesis was removed and never replaced.               Hospitalized 07/02/22-07/07/22 for the right hip cellulitis, CT hip in hospital fluid collection unclear if it communicates with the joint space, Ortho not felt to be related to the joint space and may be an old fluid collection, aspiration not recommended.              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 F/u Ortho. TDWB. Power wc. 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 Fully treated, then off antibiotics. 11/17/22 ID no infection, no tx, healed.               Elevated AST/ALT normalized, S/p cholecystectomy, [place] 02/19/21 no cyst or mass.                    Anemia, post op, s/p EGD no active bleed. S/p 6 u PRBC transfusion, ASA was  dc'd, On Fe, Iron 15 in hospital, Hgb 10.8 01/11/23             Muscle spasm, left lower back, off Gabapentin, failed Methocarbamol, now taking Norco             HTN  resumed Furosemide, off Spironolactone.              Morbid obesity             OSA CPAP             Chronic diarrhea, prn Imodium, Align             RA f/u Rheumatology, hx of Plaquenil use, stable presently.              Insomnia/depression/anxiety, stable, off Mirtazapine.              GERD, stable, on  Pantoprazole, off  Omeprazole, prn Zofran. Hgb 10.8 01/11/23             CHF/Edema, dependent  areas, resumed Furosemide, off Spironolactone, BNP 98 11/15/22 CKD stage 4  Bun/creat 29/1.95 01/11/23, K 3.4, pending f/u serum K, f/u Nephrology  Hyponatremia, Na 136 01/11/23             Hypothyroidism, 11/22/22 increased Levothyroxine every day/10mcg and alternating, TSH 3.48 06/28/22<<24.34 11/15/22<<26.19 11/17/22, repeat TSH 2 months from 11/18/22    Past Medical History:  Diagnosis Date  . Benign hypertension with chronic kidney disease, stage III (HCC)    Overview:  Last Assessment & Plan:  Usually the patient is hypertensive. However today her blood pressure is 105 systolic. She has been feeling fatigued with some lightheadedness. Part of this may be related to her lower blood pressure. Her pressure may be improved with her decrease in salt and fluid intake. I've instructed her to put her lisinopril/hctz on hold. I've asked her to see her primary   . Cardiac disease 03/10/2014  . Chronic diarrhea 07/27/2015  . Edema 07/27/2015  . Ejection fraction 2004   Normal, echo,   . Gastroesophageal reflux disease   . GERD (gastroesophageal reflux disease)   . Heart disease 03/10/2014  . Hypertension   . Left bundle branch block 09/01/2020  . Obesity   . Obesity (BMI 30-39.9) 09/25/2017  . OSA (obstructive sleep apnea)    mild with AHI 6.75 - on CPAP  . Preop cardiovascular exam 11/2010   Cardiac clearance for knee surgery   . Sleep apnea 03/10/2014  . Supraventricular tachycardia    Documented episode in the past, possibly reentrant tachycardia  Overview:  Overview:  Documented episode in the past, 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 It was possibly reentrant. She does well with diltiazem. No change in therapy.  . 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  . Thyroid disease   . Urinary incontinence    Past Surgical History:  Procedure Laterality Date  . CATARACT EXTRACTION Left 8/11  .  CATARACT EXTRACTION Right 3/12  . CHOLECYSTECTOMY  1994  . Copsilotomy Laser Treatment Left 6/12   eye  . ELBOW SURGERY  8/12  . 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 EYE SURGERY Right 12/11  . Lumbar Infusion  2011  . MOLE REMOVAL  06/17/2015  . 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 TOTAL KNEE ARTHROPLASTY Right 06/13/2011  Allergies  Allergen Reactions  . Keflex [Cephalexin] Diarrhea  . Macrobid [Nitrofurantoin] Hives  . Amoxicillin Rash  . Cefadroxil Rash    Allergies as of 01/18/2023       Reactions   Keflex [cephalexin] Diarrhea   Macrobid [nitrofurantoin] Hives   Amoxicillin Rash   Cefadroxil Rash        Medication List        Accurate as of January 18, 2023 11:59 PM. If you have any questions, ask your nurse or doctor.          albuterol 108 (90 Base) MCG/ACT inhaler Commonly known as: VENTOLIN HFA Inhale 2 puffs into the lungs every 6 (six) hours as needed for wheezing or shortness of breath.   bifidobacterium infantis capsule Take 1 capsule by mouth daily.   CALCIUM PLUS VITAMIN D3 PO Take 1 tablet by mouth daily. Vitamin D3 + Calcium 600   ciprofloxacin 500 MG tablet Commonly known as: Cipro Take 1 tablet (500 mg total) by mouth 2 (two) times daily for 6 days.   diphenhydrAMINE 12.5 MG/5ML elixir Commonly known as: BENADRYL Take 10 mLs (25 mg total) by mouth every 6 (six) hours as needed for up to 3 days for allergies or itching.   ferrous sulfate 325 (65 FE) MG EC tablet Take 325 mg by mouth as directed. Once A Day on Mon, Thu   furosemide 20 MG tablet Commonly known as: LASIX Take 20 mg by mouth daily.   HYDROcodone-acetaminophen 5-325 MG tablet Commonly known as: NORCO/VICODIN Take 1 tablet by mouth in the morning and at bedtime.   ipratropium-albuterol 0.5-2.5 (3) MG/3ML Soln Commonly known as: DUONEB Inhale 3 mLs into the lungs every 8 (eight) hours as needed (wheezing).   levothyroxine 25 MCG tablet Commonly  known as: SYNTHROID Take 25 mcg by mouth as directed. Take 1 tablet (25 mcg) along with 75 mcg=100 mcg   levothyroxine 75 MCG tablet Commonly known as: SYNTHROID Take 75 mcg by mouth as directed. Take 1 tablet (75 mcg) along with 25 mcg=100 mcg   loperamide 2 MG capsule Commonly known as: IMODIUM Take 2 mg by mouth every 4 (four) hours as needed for diarrhea or loose stools.   metroNIDAZOLE 500 MG tablet Commonly known as: FLAGYL Take 1 tablet (500 mg total) by mouth every 12 (twelve) hours for 6 days.   nystatin powder Commonly known as: MYCOSTATIN/NYSTOP Apply 1 application  topically in the morning, at noon, in the evening, and at bedtime. To genital areas and inner thigh   nystatin cream Commonly known as: MYCOSTATIN Apply 1 Application topically at bedtime.   ondansetron 4 MG tablet Commonly known as: ZOFRAN Take 4 mg by mouth every 8 (eight) hours as needed for nausea or vomiting.   Protonix 40 MG tablet Generic drug: pantoprazole Take 40 mg by mouth daily.   triamcinolone cream 0.5 % Commonly known as: KENALOG Apply 1 Application topically at bedtime.   Vitamin D3 50 MCG (2000 UT) Tabs Take 2 tablets by mouth daily.        Review of Systems  Constitutional:  Negative for appetite change, fatigue and fever.  HENT:  Negative for congestion, sore throat and trouble swallowing.   Eyes:  Negative for visual disturbance.  Respiratory:  Negative for cough and shortness of breath.   Cardiovascular:  Positive for leg swelling.  Gastrointestinal:  Negative for constipation.  Genitourinary:  Positive for frequency. Negative for dysuria, urgency, vaginal bleeding, vaginal discharge and vaginal pain.  Urogenital area itching.   Musculoskeletal:  Positive for arthralgias, back pain and gait problem.       Mid to lower back pain  Skin:  Positive for rash.  Neurological:  Negative for speech difficulty and weakness.  Psychiatric/Behavioral:  Negative for confusion  and sleep disturbance. The patient is not nervous/anxious.     Immunization History  Administered Date(s) Administered  . Influenza Split 01/28/2008, 02/17/2009, 02/20/2012, 02/11/2013, 02/02/2016, 01/11/2017, 02/04/2019  . Influenza, High Dose Seasonal PF 02/04/2015, 01/11/2017, 01/30/2018, 02/05/2020  . Influenza-Unspecified 02/16/2022  . Moderna SARS-COV2 Booster Vaccination 02/24/2022  . Moderna Sars-Covid-2 Vaccination 05/09/2019, 05/27/2019, 03/03/2020, 09/04/2020, 11/04/2020  . [employer], [employer] 80yrs & up 02/09/2021  . Pneumococcal Conjugate-13 08/30/2013  . Pneumococcal Polysaccharide-23 11/03/2005  . RSV,unspecified 04/22/2022  . Td 08/01/2001, 09/05/2019  . Tdap 09/22/2010, 11/24/2020  . Zoster Recombinant(Shingrix) 11/08/2016, 01/11/2017  . Zoster, Live 11/04/2005, 11/08/2016, 01/11/2017  . Zoster, Unspecified 01/11/2017   Pertinent  Health Maintenance Due  Topic Date Due  . INFLUENZA VACCINE  11/24/2022  . DEXA SCAN  Completed      04/15/2022   10:15 AM 04/27/2022   11:35 AM 05/31/2022    9:34 AM 07/26/2022    9:31 AM 10/21/2022    9:31 AM  Fall Risk  Falls in the past year? 0 0 0 0 0  Was there an injury with Fall? 0 0 0 0 0  Fall Risk Category Calculator 0 0 0 0 0  Fall Risk Category (Retired) Low Low     (RETIRED) Patient Fall Risk Level Moderate fall risk Moderate fall risk     Patient at Risk for Falls Due to History of fall(s) History of fall(s) History of fall(s)  No Fall Risks  Fall risk Follow up Falls evaluation completed Falls evaluation completed Falls evaluation completed  Falls evaluation completed   Functional Status Survey:    Vitals:   01/18/23 1644  BP: 112/62  Pulse: 71  Resp: 20  Temp: (!) 97.1 F (36.2 C)  SpO2: 98%  Weight: 281 lb (127.5 kg)   Body mass index is 42.73 kg/m. Physical Exam Vitals and nursing note reviewed.  Constitutional:      Appearance: Normal appearance. She is obese.  HENT:     Head:  Normocephalic and atraumatic.     Nose: Nose normal.     Mouth/Throat:     Mouth: Mucous membranes are moist.  Eyes:     Extraocular Movements: Extraocular movements intact.     Conjunctiva/sclera: Conjunctivae normal.     Pupils: Pupils are equal, round, and reactive to light.  Cardiovascular:     Rate and Rhythm: Normal rate and regular rhythm.     Heart sounds: No murmur heard. Pulmonary:     Effort: Pulmonary effort is normal.     Breath sounds: No rales.  Abdominal:     General: Bowel sounds are normal.     Palpations: Abdomen is soft.     Tenderness: There is no abdominal tenderness. There is no right CVA tenderness, left CVA tenderness, guarding or rebound.  Musculoskeletal:        General: Tenderness present.     Cervical back: Normal range of motion and neck supple.     Right lower leg: Edema present.     Left lower leg: Edema present.     Comments: R hip, prosthetic hip removed. Dependent edema moderate. C/o muscle spasm in the lower back, mostly left sided.  Mid to lower spinal  pain/aches palpated, but no sinal spinous process tenderness noted.   Skin:    General: Skin is warm and dry.     Findings: Rash present.     Comments: Right hip surgical scar.  Edema mostly in the dependent areas, buttock, thighs, legs.  Macular red spots arms and legs itching, urogenital redness, vagina exam: no fishy odor, yellow or white discharge   Neurological:     General: No focal deficit present.     Mental Status: She is alert and oriented to person, place, and time.     Gait: Gait abnormal.  Psychiatric:        Mood and Affect: Mood normal.        Behavior: Behavior normal.        Thought Content: Thought content normal.        Judgment: Judgment normal.    Labs reviewed: Recent Labs    07/04/22 0502 07/05/22 0538 07/06/22 0702 07/08/22 1714 07/12/22 0437 07/13/22 0412 10/19/22 0339 10/25/22 0000 01/10/23 0000 01/10/23 1938 01/11/23 0500  NA 134* 135 136   < > 133*    < > 134*   < > 138 135 136  K 4.2 3.7 3.4*   < > 3.0*   < > 3.7   < > 4.1 3.8 3.4*  CL 104 106 109   < > 102   < > 107   < > 108 107 108  CO2 20* 19* 19*   < > 20*   < > 21*   < > 21 21* 21*  GLUCOSE 96 94 106*   < > 113*   < > 88  --   --  105* 87  BUN 25* 24* 20   < > 20   < > 42*   < > 32* 31* 29*  CREATININE 1.21* 1.14* 1.01*   < > 1.21*   < > 2.04*   < > 2.1* 2.10* 1.95*  CALCIUM 8.2* 8.4* 8.4*   < > 7.7*   < > 7.9*   < > 8.8 8.7* 8.4*  MG 2.4 2.4 2.4  --  1.8  --   --   --   --   --   --   PHOS 3.6 4.3 3.6  --   --   --   --   --   --   --   --    < > = values in this interval not displayed.   Recent Labs    07/06/22 0702 07/21/22 0000 10/13/22 1855 11/15/22 0000 01/10/23 0000 01/10/23 1938  AST 36   < > 20 19 15 21   ALT 25   < > 17 12 10 17   ALKPHOS 63   < > 85 84 83 87  BILITOT 0.3  --  1.1  --   --  0.6  PROT 6.4*  --  7.0  --   --  7.3  ALBUMIN 2.3*   < > 2.7* 3.3* 3.3* 3.5   < > = values in this interval not displayed.   Recent Labs    10/19/22 0339 11/15/22 0000 01/06/23 0000 01/10/23 0000 01/10/23 1938 01/11/23 0500  WBC 11.9*   < > 6.1 6.0 6.5 6.1  NEUTROABS  --    < > 3,709.00 3,168.00 3.6  --   HGB 11.5*   < > 11.3* 10.6* 11.8* 10.8*  HCT 37.1   < > 36 33* 39.4 35.3*  MCV 89.6  --   --   --  89.5 89.1  PLT 261   < > 280 244 272 230   < > = values in this interval not displayed.   Lab Results  Component Value Date   TSH 2.119 01/10/2023   No results found for: "HGBA1C" No results found for: "CHOL", "HDL", "LDLCALC", "LDLDIRECT", "TRIG", "CHOLHDL"  Significant Diagnostic Results in last 30 days:  CT ABDOMEN PELVIS WO CONTRAST  Result Date: 01/10/2023 CLINICAL DATA:  Left lower quadrant pain. UTI detected on outside urinalysis but she also has a history of diverticulitis. EXAM: CT ABDOMEN AND PELVIS WITHOUT CONTRAST TECHNIQUE: Multidetector CT imaging of the abdomen and pelvis was performed following the standard protocol without IV contrast.  RADIATION DOSE REDUCTION: This exam was performed according to the departmental dose-optimization program which includes automated exposure control, adjustment of the mA and/or kV according to patient size and/or use of iterative reconstruction technique. COMPARISON:  Limited comparison is available with a CTA chest 07/02/2022 and postmyelogram CT lumbar spine dated 04/06/2020. There is no prior abdominopelvic dedicated imaging. FINDINGS: Lower chest: Lung bases demonstrate scattered linear scarring or atelectasis without infiltrates. Interval resolution of the small pleural effusions noted on 07/02/2022. A moderate-sized hiatal hernia is again shown. The cardiac size is normal. There are calcifications in the right coronary artery. No pericardial fluid. Hepatobiliary: The liver is 21 cm length and mildly steatotic. No mass is seen without contrast. Gallbladder is absent and there is no biliary dilatation. Pancreas: The gland is moderately atrophic. No masses seen without contrast or ductal dilatation. Spleen: Unremarkable without contrast.  No splenomegaly. Adrenals/Urinary Tract: No adrenal mass. Cortical thinning noted somewhat in both kidneys. Small length measurement of the right kidney of 7.4 cm with low-normal length measurement on the left at 9 cm. There is no contour deforming abnormality of either kidney. There is no urinary stone or obstruction. Small bilateral extrarenal pelves are again shown. There is no thickening of the bladder wall. Stomach/Bowel: Moderate-sized hiatal hernia. No dilatation or wall thickening including the appendix. There is diffuse diverticulosis distal transverse and left-sided colon. There are faint stranding changes along side the distal third of the descending colon which would either represent a mild uncomplicated diverticulitis or fat scarring from prior diverticular disease. A mild diverticulitis is favored in this case. There is no diverticular abscess or free air.  Vascular/Lymphatic: Aortic atherosclerosis. No enlarged abdominal or pelvic lymph nodes. Reproductive: Uterus and bilateral adnexa are unremarkable. Other: No free fluid, free air, free hemorrhage or incarcerated hernia. Small umbilical fat hernia. Musculoskeletal: Old spinous process fusion hardware L3-4 and L4-5 with dorsal decompression at L5-S1 and transitional anatomy with partial lumbarization of S1. Multilevel degenerative discs with vacuum phenomenon. Spondylosis. There is chronic wedging of L1 and 3, chronic and unchanged minimal retrolisthesis at L1-2, minimal anterolisthesis at L3-4, L4-5 and L5-S1, with multilevel acquired foraminal stenosis. Comparison is also made to the right hip CT from 07/11/2022. Redemonstration of removal of hardware from the prior right hip arthroplasty is again noted, similar appearance of fluid and granulation tissue in the right hip bed with again noted cephalad migration of the right femoral shaft into the area. There is healed fracture deformity along the right acetabular roof with acetabular remodeling. All of these findings were present previously and unchanged. No destructive osseous lesion is evident or new abnormality. There is chronic atrophy in the dorsal paraspinous muscles and right iliopsoas and gluteal musculature. IMPRESSION: 1. Diffuse left-sided colonic diverticulosis, with faint stranding changes alongside the distal third of the descending  colon which would either represent a mild uncomplicated diverticulitis or fat scarring from prior diverticular disease. A mild diverticulitis is favored in this case. No diverticular abscess or free air. 2. No urinary stone or obstruction. 3. Cortical thinning in both kidneys with small right kidney and low-normal left kidney length. No urinary stone or obstruction. No bladder thickening. 4. Aortic and coronary artery atherosclerosis. 5. Moderate-sized hiatal hernia. 6. Mildly prominent liver with mild steatosis. 7. Small  umbilical fat hernia. 8. Chronic changes in the right hip with removal of hardware from the prior right hip arthroplasty, and right-sided muscular atrophy described above. 9. Degenerative and chronic postsurgical changes lumbar spine with osteopenia and chronic wedging of L1 and 3. Aortic Atherosclerosis (ICD10-I70.0). Electronically Signed   By: Almira Bar M.D.   On: 01/10/2023 21:40    Assessment/Plan: Itching due to drug Prednisone 20mg  every day x3, arms and thighs, macular rash  Yeast vaginitis Monistat 3, no s/s of infection, but given extensive antibiotics use, will treat with Monistat  Acute diverticulitis Hospitalized 01/10/23-01/12/23 for UTI, diverticulitis, treated with Zosyn, Cefadroxil, Flagyl, developed pruritic rash, then ID changed to Cipro and Flagyl-complete 01/19/23, last colonoscopy 2018. Has x1 Fosfomycin.  Hx of UTI, prosthetic joint infection 2022, cellulitis/abscess of the right hip after hardware removed 3/20  Chronic heart failure with preserved ejection fraction (HFpEF) (HCC) compensated, on Furosemide.   Absence of hip joint, right Absent hip, s/p right THR 2018, removal of hardware 12/29/20,  takes Tylenol. F/u Ortho. TDWB. Power wc. 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 Fully treated, then off antibiotics. 11/17/22 ID no infection, no tx, healed.   Chronic anemia post op, s/p EGD no active bleed. S/p 6 u PRBC transfusion, ASA was dc'd, On Fe, Iron 15 in hospital, Hgb 10.8 01/11/23  Essential hypertension Blood pressure is controlled, take Furosemide.   Rheumatoid arthritis (HCC) f/u Rheumatology, hx of Plaquenil use, stable presently.   GERD (gastroesophageal reflux disease)  stable, on  Pantoprazole, off  Omeprazole, prn Zofran. Hgb 10.8 01/11/23  CKD (chronic kidney disease) stage 4, GFR 15-29 ml/min (HCC) Bun/creat 29/1.95 01/11/23, K 3.4, pending f/u serum K, f/u Nephrology   Hyponatremia Na 136 01/11/23  Acquired  hypothyroidism 11/22/22 increased Levothyroxine every day/13mcg and alternating, TSH 3.48 06/28/22<<24.34 11/15/22<<26.19 11/17/22, repeat TSH 2 months from 11/18/22   Family/ staff Communication: plan of care reviewed with the patient and charge nurse.   Labs/tests ordered:  none  Time spend 35 minutes.

## 2023-01-18 NOTE — Assessment & Plan Note (Signed)
Prednisone 20mg  every day x3, arms and thighs, macular rash

## 2023-01-19 NOTE — Assessment & Plan Note (Signed)
stable, on  Pantoprazole, off  Omeprazole, prn Zofran. Hgb 10.8 01/11/23

## 2023-01-19 NOTE — Assessment & Plan Note (Signed)
Hospitalized 01/10/23-01/12/23 for UTI, diverticulitis, treated with Zosyn, Cefadroxil, Flagyl, developed pruritic rash, then ID changed to Cipro and Flagyl-complete 01/19/23, last colonoscopy 2018. Has x1 Fosfomycin.  Hx of UTI, prosthetic joint infection 2022, cellulitis/abscess of the right hip after hardware removed 3/20

## 2023-01-19 NOTE — Assessment & Plan Note (Signed)
Blood pressure is controlled, take Furosemide.

## 2023-01-19 NOTE — Assessment & Plan Note (Signed)
compensated, on Furosemide.

## 2023-01-19 NOTE — Assessment & Plan Note (Signed)
post op, s/p EGD no active bleed. S/p 6 u PRBC transfusion, ASA was dc'd, On Fe, Iron 15 in hospital, Hgb 10.8 01/11/23

## 2023-01-19 NOTE — Assessment & Plan Note (Signed)
Bun/creat 29/1.95 01/11/23, K 3.4, pending f/u serum K, f/u Nephrology

## 2023-01-19 NOTE — Assessment & Plan Note (Signed)
Na 136 01/11/23

## 2023-01-19 NOTE — Assessment & Plan Note (Signed)
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 F/u Ortho. TDWB. Power wc. 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 Fully treated, then off antibiotics. 11/17/22 ID no infection, no tx, healed.

## 2023-01-19 NOTE — Assessment & Plan Note (Signed)
f/u Rheumatology, hx of Plaquenil use, stable presently.

## 2023-01-19 NOTE — Assessment & Plan Note (Signed)
11/22/22 increased Levothyroxine every day/34mcg and alternating, TSH 3.48 06/28/22<<24.34 11/15/22<<26.19 11/17/22, repeat TSH 2 months from 11/18/22

## 2023-02-02 ENCOUNTER — Non-Acute Institutional Stay (SKILLED_NURSING_FACILITY): Payer: Medicare Other | Admitting: Adult Health

## 2023-02-02 ENCOUNTER — Encounter: Payer: Self-pay | Admitting: Adult Health

## 2023-02-02 DIAGNOSIS — J209 Acute bronchitis, unspecified: Secondary | ICD-10-CM | POA: Diagnosis not present

## 2023-02-02 MED ORDER — DOXYCYCLINE HYCLATE 100 MG PO TABS
100.0000 mg | ORAL_TABLET | Freq: Two times a day (BID) | ORAL | 0 refills | Status: AC
Start: 2023-02-02 — End: 2023-02-09

## 2023-02-02 NOTE — Progress Notes (Signed)
Location:  [facility] Room Number: 65 Place of Service:  SNF (31) Provider:  Kenard Gower, DNP, FNP-BC  Patient Care Team: Heffner, Jimmye Norman, MD as PCP - General (Family Medicine) [name], MD as PCP - Cardiology (Cardiology) [name], MD as PCP - Sleep Medicine (Cardiology) [name], MD (Sports Medicine) [name], MD (Ophthalmology) [name], [name], MD (Dermatology) [name], MD (Gastroenterology) [name], MD (Nephrology) [name], DC as Referring Physician (Chiropractic Medicine)  Extended Emergency Contact Information Primary Emergency Contact: [facility] Address: [address]          [place], [place] [address] [name] of [place] Home Phone: [phone] Mobile Phone: [phone] Relation: Spouse  Code Status:  DNR  Goals of care: Advanced Directive information    02/02/2023    4:17 PM  Advanced Directives  Does Patient Have a Medical Advance Directive? Yes  Type of Advance Directive Living will;Out of facility DNR (pink MOST or yellow form)  Does patient want to make changes to medical advance directive? No - Patient declined  Would patient like information on creating a medical advance directive? No - Patient declined  Pre-existing out of facility DNR order (yellow form or pink MOST form) Yellow form placed in chart (order not valid for inpatient use)     Chief Complaint  Patient presents with   Acute Visit    feels sick after flu vaccine    HPI:  Pt is a 82 y.o. female seen today for an acute visit regarding feeling sick after flu vaccination. She is a resident of [facility]. She recently had flu vaccination and now complaining of hoarse voice and coughing. She was noted to have minimal wheezing to bilateral lungs. No reported fever nor chills.  Chest x-ray was negative for infiltrate, 02/01/23   Past Medical History:   Diagnosis Date   Benign hypertension with chronic kidney disease, stage III (HCC)    Overview:  Last Assessment & Plan:  Usually the patient is hypertensive. However today her blood pressure is 105 systolic. She has been feeling fatigued with some lightheadedness. Part of this may be related to her lower blood pressure. Her pressure may be improved with her decrease in salt and fluid intake. I've instructed her to put her lisinopril/hctz on hold. 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 It was possibly reentrant. She does well with diltiazem. No change in therapy.   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   albuterol (VENTOLIN HFA) 108 (90 Base) MCG/ACT inhaler Inhale 2 puffs into the lungs every 6 (six) hours as needed for wheezing or shortness of breath.   bifidobacterium infantis (ALIGN) capsule Take 1 capsule by mouth daily.   Calcium Carb-Cholecalciferol (CALCIUM PLUS VITAMIN D3 PO) Take 1 tablet by mouth daily. Vitamin D3 + Calcium 600   Cholecalciferol (VITAMIN D3) 50 MCG (2000 UT) TABS Take 2 tablets by mouth daily.   doxycycline (VIBRA-TABS) 100 MG tablet Take 1 tablet (100 mg total) by mouth 2 (two) times daily for 7 days.   ferrous sulfate 325 (65 FE) MG EC tablet Take 325 mg by mouth as directed. Once A Day on Mon, Thu   furosemide (LASIX) 20 MG tablet Take 20 mg by mouth daily.   guaiFENesin 200 MG/10ML LIQD Take 10 mLs by mouth every 6 (six) hours as needed.   HYDROcodone-acetaminophen (NORCO/VICODIN) 5-325 MG tablet Take 1 tablet by mouth in the morning and at bedtime.   ipratropium-albuterol (DUONEB) 0.5-2.5 (3) MG/3ML SOLN Inhale 3 mLs into the lungs every 8 (eight) hours as needed (wheezing).   levothyroxine (SYNTHROID) 25 MCG tablet Take 25 mcg by mouth as directed. Take 1 tablet (25 mcg) along with 75 mcg=100 mcg   levothyroxine (SYNTHROID) 75 MCG tablet Take 75 mcg by mouth as directed. Take 1 tablet (75 mcg) along with 25 mcg=100 mcg   loperamide (IMODIUM) 2 MG capsule Take 2 mg by mouth every 4 (four) hours as needed for diarrhea or loose stools.   nystatin (MYCOSTATIN/NYSTOP) powder Apply 1 application  topically in the morning, at noon, in the evening, and at bedtime. To genital areas and inner thigh   nystatin cream (MYCOSTATIN) Apply 1  Application topically at bedtime.   ondansetron (ZOFRAN) 4 MG tablet Take 4 mg by mouth every 8 (eight) hours as needed for nausea or vomiting.   pantoprazole (PROTONIX) 40 MG tablet Take 40 mg by mouth daily.   potassium chloride (MICRO-K) 10 MEQ CR capsule Take 10 mEq by mouth once.   triamcinolone cream (KENALOG) 0.5 % Apply 1 Application topically at bedtime.   diphenhydrAMINE (BENADRYL) 12.5 MG/5ML elixir Take 10 mLs (25 mg total) by mouth every 6 (six) hours as needed for up to 3 days for allergies or itching.   No facility-administered encounter medications on file as of 02/02/2023.    Review of Systems  Constitutional:  Negative for appetite change, chills, fatigue and fever.  HENT:  Negative for congestion, hearing loss, rhinorrhea and sore throat.   Eyes: Negative.   Respiratory:  Positive for cough and shortness of breath. Negative for wheezing.   Cardiovascular:  Negative for chest  pain, palpitations and leg swelling.  Gastrointestinal:  Negative for abdominal pain, constipation, diarrhea, nausea and vomiting.  Genitourinary:  Negative for dysuria.  Musculoskeletal:  Negative for arthralgias, back pain and myalgias.  Skin:  Negative for color change, rash and wound.  Neurological:  Negative for dizziness, weakness and headaches.  Psychiatric/Behavioral:  Negative for behavioral problems. The patient is not nervous/anxious.        Immunization History  Administered Date(s) Administered   Influenza Split 01/28/2008, 02/17/2009, 02/20/2012, 02/11/2013, 02/02/2016, 01/11/2017, 02/04/2019   Influenza, High Dose Seasonal PF 02/04/2015, 01/11/2017, 01/30/2018, 02/05/2020, 01/25/2023   Influenza-Unspecified 02/16/2022   Moderna SARS-COV2 Booster Vaccination 02/24/2022   Moderna Sars-Covid-2 Vaccination 05/09/2019, 05/27/2019, 03/03/2020, 09/04/2020, 11/04/2020   Pfizer Covid-19 Vaccine Bivalent Booster 76yrs & up 02/09/2021   Pneumococcal Conjugate-13 08/30/2013   Pneumococcal  Polysaccharide-23 11/03/2005   RSV,unspecified 04/22/2022   Td 08/01/2001, 09/05/2019   Tdap 09/22/2010, 11/24/2020   Zoster Recombinant(Shingrix) 11/08/2016, 01/11/2017   Zoster, Live 11/04/2005, 11/08/2016, 01/11/2017   Zoster, Unspecified 01/11/2017   Pertinent  Health Maintenance Due  Topic Date Due   INFLUENZA VACCINE  Completed   DEXA SCAN  Completed      04/15/2022   10:15 AM 04/27/2022   11:35 AM 05/31/2022    9:34 AM 07/26/2022    9:31 AM 10/21/2022    9:31 AM  Fall Risk  Falls in the past year? 0 0 0 0 0  Was there an injury with Fall? 0 0 0 0 0  Fall Risk Category Calculator 0 0 0 0 0  Fall Risk Category (Retired) Low Low     (RETIRED) Patient Fall Risk Level Moderate fall risk Moderate fall risk     Patient at Risk for Falls Due to History of fall(s) History of fall(s) History of fall(s)  No Fall Risks  Fall risk Follow up Falls evaluation completed Falls evaluation completed Falls evaluation completed  Falls evaluation completed     Vitals:   02/02/23 1602  BP: (!) 168/74  Pulse: 70  Resp: 16  Temp: (!) 97.4 F (36.3 C)  SpO2: 94%  Weight: 282 lb 9.6 oz (128.2 kg)  Height: 5\' 8"  (1.727 m)   Body mass index is 42.97 kg/m.  Physical Exam Constitutional:      Appearance: She is obese.     Comments: Morbidly obese  HENT:     Head: Normocephalic and atraumatic.     Nose: Nose normal.     Mouth/Throat:     Mouth: Mucous membranes are moist.  Eyes:     Conjunctiva/sclera: Conjunctivae normal.  Cardiovascular:     Rate and Rhythm: Normal rate and regular rhythm.  Pulmonary:     Effort: Pulmonary effort is normal.     Breath sounds: Wheezing present.  Abdominal:     General: Bowel sounds are normal.     Palpations: Abdomen is soft.  Musculoskeletal:     Cervical back: Normal range of motion.  Skin:    General: Skin is warm and dry.  Neurological:     General: No focal deficit present.     Mental Status: She is alert and oriented to person, place, and  time.  Psychiatric:        Mood and Affect: Mood normal.        Behavior: Behavior normal.        Thought Content: Thought content normal.        Judgment: Judgment normal.      Labs reviewed: Recent Labs  07/04/22 0502 07/05/22 0981 07/06/22 1914 07/08/22 1714 07/12/22 0437 07/13/22 0412 10/19/22 0339 10/25/22 0000 01/10/23 1938 01/11/23 0500 01/16/23 0000  NA 134* 135 136   < > 133*   < > 134*   < > 135 136 136*  K 4.2 3.7 3.4*   < > 3.0*   < > 3.7   < > 3.8 3.4* 3.4*  CL 104 106 109   < > 102   < > 107   < > 107 108 108  CO2 20* 19* 19*   < > 20*   < > 21*   < > 21* 21* 21  GLUCOSE 96 94 106*   < > 113*   < > 88  --  105* 87  --   BUN 25* 24* 20   < > 20   < > 42*   < > 31* 29* 20  CREATININE 1.21* 1.14* 1.01*   < > 1.21*   < > 2.04*   < > 2.10* 1.95* 2.0*  CALCIUM 8.2* 8.4* 8.4*   < > 7.7*   < > 7.9*   < > 8.7* 8.4* 8.6*  MG 2.4 2.4 2.4  --  1.8  --   --   --   --   --   --   PHOS 3.6 4.3 3.6  --   --   --   --   --   --   --   --    < > = values in this interval not displayed.   Recent Labs    07/06/22 0702 07/21/22 0000 10/13/22 1855 11/15/22 0000 01/10/23 0000 01/10/23 1938  AST 36   < > 20 19 15 21   ALT 25   < > 17 12 10 17   ALKPHOS 63   < > 85 84 83 87  BILITOT 0.3  --  1.1  --   --  0.6  PROT 6.4*  --  7.0  --   --  7.3  ALBUMIN 2.3*   < > 2.7* 3.3* 3.3* 3.5   < > = values in this interval not displayed.   Recent Labs    10/19/22 0339 11/15/22 0000 01/10/23 0000 01/10/23 1938 01/11/23 0500 01/16/23 0000  WBC 11.9*   < > 6.0 6.5 6.1 7.5  NEUTROABS  --    < > 3,168.00 3.6  --  4,770.00  HGB 11.5*   < > 10.6* 11.8* 10.8* 10.7*  HCT 37.1   < > 33* 39.4 35.3* 34*  MCV 89.6  --   --  89.5 89.1  --   PLT 261   < > 244 272 230 225   < > = values in this interval not displayed.   Lab Results  Component Value Date   TSH 2.119 01/10/2023   No results found for: "HGBA1C" No results found for: "CHOL", "HDL", "LDLCALC", "LDLDIRECT", "TRIG",  "CHOLHDL"  Significant Diagnostic Results in last 30 days:  CT ABDOMEN PELVIS WO CONTRAST  Result Date: 01/10/2023 CLINICAL DATA:  Left lower quadrant pain. UTI detected on outside urinalysis but she also has a history of diverticulitis. EXAM: CT ABDOMEN AND PELVIS WITHOUT CONTRAST TECHNIQUE: Multidetector CT imaging of the abdomen and pelvis was performed following the standard protocol without IV contrast. RADIATION DOSE REDUCTION: This exam was performed according to the departmental dose-optimization program which includes automated exposure control, adjustment of the mA and/or kV according to patient size and/or use of iterative reconstruction technique. COMPARISON:  Limited comparison is available with a CTA chest 07/02/2022 and postmyelogram CT lumbar spine dated 04/06/2020. There is no prior abdominopelvic dedicated imaging. FINDINGS: Lower chest: Lung bases demonstrate scattered linear scarring or atelectasis without infiltrates. Interval resolution of the small pleural effusions noted on 07/02/2022. A moderate-sized hiatal hernia is again shown. The cardiac size is normal. There are calcifications in the right coronary artery. No pericardial fluid. Hepatobiliary: The liver is 21 cm length and mildly steatotic. No mass is seen without contrast. Gallbladder is absent and there is no biliary dilatation. Pancreas: The gland is moderately atrophic. No masses seen without contrast or ductal dilatation. Spleen: Unremarkable without contrast.  No splenomegaly. Adrenals/Urinary Tract: No adrenal mass. Cortical thinning noted somewhat in both kidneys. Small length measurement of the right kidney of 7.4 cm with low-normal length measurement on the left at 9 cm. There is no contour deforming abnormality of either kidney. There is no urinary stone or obstruction. Small bilateral extrarenal pelves are again shown. There is no thickening of the bladder wall. Stomach/Bowel: Moderate-sized hiatal hernia. No dilatation  or wall thickening including the appendix. There is diffuse diverticulosis distal transverse and left-sided colon. There are faint stranding changes along side the distal third of the descending colon which would either represent a mild uncomplicated diverticulitis or fat scarring from prior diverticular disease. A mild diverticulitis is favored in this case. There is no diverticular abscess or free air. Vascular/Lymphatic: Aortic atherosclerosis. No enlarged abdominal or pelvic lymph nodes. Reproductive: Uterus and bilateral adnexa are unremarkable. Other: No free fluid, free air, free hemorrhage or incarcerated hernia. Small umbilical fat hernia. Musculoskeletal: Old spinous process fusion hardware L3-4 and L4-5 with dorsal decompression at L5-S1 and transitional anatomy with partial lumbarization of S1. Multilevel degenerative discs with vacuum phenomenon. Spondylosis. There is chronic wedging of L1 and 3, chronic and unchanged minimal retrolisthesis at L1-2, minimal anterolisthesis at L3-4, L4-5 and L5-S1, with multilevel acquired foraminal stenosis. Comparison is also made to the right hip CT from 07/11/2022. Redemonstration of removal of hardware from the prior right hip arthroplasty is again noted, similar appearance of fluid and granulation tissue in the right hip bed with again noted cephalad migration of the right femoral shaft into the area. There is healed fracture deformity along the right acetabular roof with acetabular remodeling. All of these findings were present previously and unchanged. No destructive osseous lesion is evident or new abnormality. There is chronic atrophy in the dorsal paraspinous muscles and right iliopsoas and gluteal musculature. IMPRESSION: 1. Diffuse left-sided colonic diverticulosis, with faint stranding changes alongside the distal third of the descending colon which would either represent a mild uncomplicated diverticulitis or fat scarring from prior diverticular disease. A  mild diverticulitis is favored in this case. No diverticular abscess or free air. 2. No urinary stone or obstruction. 3. Cortical thinning in both kidneys with small right kidney and low-normal left kidney length. No urinary stone or obstruction. No bladder thickening. 4. Aortic and coronary artery atherosclerosis. 5. Moderate-sized hiatal hernia. 6. Mildly prominent liver with mild steatosis. 7. Small umbilical fat hernia. 8. Chronic changes in the right hip with removal of hardware from the prior right hip arthroplasty, and right-sided muscular atrophy described above. 9. Degenerative and chronic postsurgical changes lumbar spine with osteopenia and chronic wedging of L1 and 3. Aortic Atherosclerosis (ICD10-I70.0). Electronically Signed   By: Almira Bar M.D.   On: 01/10/2023 21:40    Assessment/Plan  1. Acute bronchitis, unspecified organism -  will start on  Doxycycline - doxycycline (VIBRA-TABS) 100 MG tablet; Take 1 tablet (100 mg total) by mouth 2 (two) times daily for 7 days.  Dispense: 14 tablet; Refill: 0 -  continue Albutero HFA PRN for SOB/wheezing and guaifenesin PRN for cough   Family/ staff Communication: Discussed plan of care with resident and charge nurse.  Labs/tests ordered: None    Kenard Gower, DNP, MSN, FNP-BC [facility] [phone] (Monday-Friday 8:00 a.m. - 5:00 p.m.) [phone] (after hours)

## 2023-02-07 ENCOUNTER — Encounter: Payer: Self-pay | Admitting: Adult Health

## 2023-02-07 ENCOUNTER — Non-Acute Institutional Stay (SKILLED_NURSING_FACILITY): Payer: Medicare Other | Admitting: Adult Health

## 2023-02-07 DIAGNOSIS — I5032 Chronic diastolic (congestive) heart failure: Secondary | ICD-10-CM | POA: Diagnosis not present

## 2023-02-07 DIAGNOSIS — L270 Generalized skin eruption due to drugs and medicaments taken internally: Secondary | ICD-10-CM

## 2023-02-07 DIAGNOSIS — J209 Acute bronchitis, unspecified: Secondary | ICD-10-CM

## 2023-02-07 DIAGNOSIS — D649 Anemia, unspecified: Secondary | ICD-10-CM | POA: Diagnosis not present

## 2023-02-07 DIAGNOSIS — N184 Chronic kidney disease, stage 4 (severe): Secondary | ICD-10-CM | POA: Diagnosis not present

## 2023-02-07 DIAGNOSIS — Z66 Do not resuscitate: Secondary | ICD-10-CM

## 2023-02-07 DIAGNOSIS — E039 Hypothyroidism, unspecified: Secondary | ICD-10-CM

## 2023-02-07 NOTE — Progress Notes (Unsigned)
Location:  [facility] Room Number: N056-A Place of Service:  SNF [phone]) Provider:  Kenard Gower, DNP, FNP-BC  Patient Care Team: Heffner, Jimmye Norman, MD as PCP - General (Family Medicine) [name], MD as PCP - Cardiology (Cardiology) [name], MD as PCP - Sleep Medicine (Cardiology) [name], MD (Sports Medicine) [name], MD (Ophthalmology) [name], [name], MD (Dermatology) [name], MD (Gastroenterology) [name], MD (Nephrology) [name], DC as Referring Physician (Chiropractic Medicine)  Extended Emergency Contact Information Primary Emergency Contact: [facility] Address: [address]          [place], [place] [address] [name] of [place] Home Phone: [phone] Mobile Phone: [phone] Relation: Spouse  Code Status:  DNR  Goals of care: Advanced Directive information    02/07/2023    2:42 PM  Advanced Directives  Does Patient Have a Medical Advance Directive? Yes  Type of Advance Directive Living will;Out of facility DNR (pink MOST or yellow form)  Does patient want to make changes to medical advance directive? No - Patient declined  Would patient like information on creating a medical advance directive? No - Patient declined  Pre-existing out of facility DNR order (yellow form or pink MOST form) Yellow form placed in chart (order not valid for inpatient use)     Chief Complaint  Patient presents with  . Medical Management of Chronic Issues    Routine Visit  . Immunizations    Covid    HPI:  Pt is a 82 y.o. female seen today for medical management of chronic diseases.  She is a resident of [facility].  Chronic heart failure with preserved ejection fraction (HFpEF) (HCC) -  no SOB, takes Lasix and KCL.  Acute bronchitis, unspecified organism -  no wheezing and has dry cough, currently taking Doxycycline and Guaifenesin DM PRN  for cough  Chronic anemia  -  hgb 10.7, takes FeSO4    Past Medical History:  Diagnosis Date  . Benign hypertension with chronic kidney disease, stage III (HCC)    Overview:  Last Assessment & Plan:  Usually the patient is hypertensive. However today her blood pressure is 105 systolic. She has been feeling fatigued with some lightheadedness. Part of this may be related to her lower blood pressure. Her pressure may be improved with her decrease in salt and fluid intake. I've instructed her to put her lisinopril/hctz on hold. I've asked her to see her primary   . Cardiac disease 03/10/2014  . Chronic diarrhea 07/27/2015  . Edema 07/27/2015  . Ejection fraction 2004   Normal, echo,   . Gastroesophageal reflux disease   . GERD (gastroesophageal reflux disease)   . Heart disease 03/10/2014  . Hypertension   . Left bundle branch block 09/01/2020  . Obesity   . Obesity (BMI 30-39.9) 09/25/2017  . OSA (obstructive sleep apnea)    mild with AHI 6.75 - on CPAP  . Preop cardiovascular exam 11/2010   Cardiac clearance for knee surgery   . Sleep apnea 03/10/2014  . 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 It was possibly reentrant. She does well with diltiazem. No change in therapy.  . SVT (supraventricular tachycardia) (HCC)    Documented  episode in the past, possibly reentrant tachycardia  . Thyroid disease   . Urinary incontinence    Past Surgical History:  Procedure Laterality Date  . CATARACT EXTRACTION Left 8/11  . CATARACT EXTRACTION Right 3/12  . CHOLECYSTECTOMY  1994  . Copsilotomy Laser Treatment Left 6/12   eye  . ELBOW SURGERY  8/12  . 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 EYE SURGERY Right 12/11  . Lumbar Infusion   2011  . MOLE REMOVAL  06/17/2015  . 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 TOTAL KNEE ARTHROPLASTY Right 06/13/2011    Allergies  Allergen Reactions  . Keflex [Cephalexin] Diarrhea  . Macrobid [Nitrofurantoin] Hives  . Amoxicillin Rash  . Cefadroxil Rash    Outpatient Encounter Medications as of 02/07/2023  Medication Sig  . albuterol (VENTOLIN HFA) 108 (90 Base) MCG/ACT inhaler Inhale 2 puffs into the lungs every 6 (six) hours as needed for wheezing or shortness of breath.  . bifidobacterium infantis (ALIGN) capsule Take 1 capsule by mouth daily.  . Calcium Carb-Cholecalciferol (CALCIUM PLUS VITAMIN D3 PO) Take 1 tablet by mouth daily. Vitamin D3 + Calcium 600  . Cholecalciferol (VITAMIN D3) 50 MCG (2000 UT) TABS Take 2 tablets by mouth daily.  Marland Kitchen doxycycline (VIBRA-TABS) 100 MG tablet Take 1 tablet (100 mg total) by mouth 2 (two) times daily for 7 days.  . ferrous sulfate 325 (65 FE) MG EC tablet Take 325 mg by mouth as directed. Once A Day on Mon, Thu  . furosemide (LASIX) 20 MG tablet Take 20 mg by mouth daily.  Marland Kitchen guaiFENesin 200 MG/10ML LIQD Take 10 mLs by mouth every 6 (six) hours as needed.  Marland Kitchen ipratropium-albuterol (DUONEB) 0.5-2.5 (3) MG/3ML SOLN Inhale 3 mLs into the lungs every 8 (eight) hours as needed (wheezing).  Marland Kitchen levothyroxine (SYNTHROID) 25 MCG tablet Take 25 mcg by mouth as directed. Take 1 tablet (25 mcg) along with 75 mcg=100 mcg  . levothyroxine (SYNTHROID) 75 MCG tablet Take 75 mcg by mouth as directed. Take 1 tablet (75 mcg) along with 25 mcg=100 mcg  . loperamide (IMODIUM) 2 MG capsule Take 2 mg by mouth every 4 (four) hours as needed for diarrhea or loose stools.  . nystatin (MYCOSTATIN/NYSTOP) powder Apply 1 application  topically in the morning, at noon, in the evening, and at bedtime. To genital areas and inner thigh  . nystatin cream (MYCOSTATIN) Apply 1 Application topically at bedtime.  . ondansetron (ZOFRAN) 4 MG tablet Take 4 mg by mouth every 8  (eight) hours as needed for nausea or vomiting.  . pantoprazole (PROTONIX) 40 MG tablet Take 40 mg by mouth daily.  . potassium chloride (MICRO-K) 10 MEQ CR capsule Take 10 mEq by mouth once.  . triamcinolone cream (KENALOG) 0.5 % Apply 1 Application topically at bedtime.  . [DISCONTINUED] acetaminophen (TYLENOL) 325 MG tablet Take 650 mg by mouth every 4 (four) hours as needed for moderate pain.  . [DISCONTINUED] diphenhydrAMINE (BENADRYL) 12.5 MG/5ML elixir Take 10 mLs (25 mg total) by mouth every 6 (six) hours as needed for up to 3 days for allergies or itching.  . [DISCONTINUED] HYDROcodone-acetaminophen (NORCO/VICODIN) 5-325 MG tablet Take 1 tablet by mouth in the morning and at bedtime.   No facility-administered encounter medications on file as of 02/07/2023.    Review of Systems  Constitutional:  Negative for appetite change, chills, fatigue and fever.  HENT:  Negative for congestion, hearing loss, rhinorrhea and sore throat.   Eyes:  Negative.   Respiratory:  Positive for cough. Negative for shortness of breath and wheezing.   Cardiovascular:  Negative for chest pain, palpitations and leg swelling.  Gastrointestinal:  Negative for abdominal pain, constipation, diarrhea, nausea and vomiting.  Genitourinary:  Negative for dysuria.  Musculoskeletal:  Negative for arthralgias, back pain and myalgias.  Skin:  Positive for rash. Negative for color change and wound.  Neurological:  Negative for dizziness, weakness and headaches.       Uses electric scooter, has BLE weakness  Psychiatric/Behavioral:  Negative for behavioral problems. The patient is not nervous/anxious.       Immunization History  Administered Date(s) Administered  . Influenza Split 01/28/2008, 02/17/2009, 02/20/2012, 02/11/2013, 02/02/2016, 01/11/2017, 02/04/2019  . Influenza, High Dose Seasonal PF 02/04/2015, 01/11/2017, 01/30/2018, 02/05/2020, 01/25/2023  . Influenza-Unspecified 02/16/2022  . Moderna SARS-COV2  Booster Vaccination 02/24/2022  . Moderna Sars-Covid-2 Vaccination 05/09/2019, 05/27/2019, 03/03/2020, 09/04/2020, 11/04/2020  . [employer], [employer] 5yrs & up 02/09/2021  . Pneumococcal Conjugate-13 08/30/2013  . Pneumococcal Polysaccharide-23 11/03/2005  . RSV,unspecified 04/22/2022  . Td 08/01/2001, 09/05/2019  . Tdap 09/22/2010, 11/24/2020  . Zoster Recombinant(Shingrix) 11/08/2016, 01/11/2017  . Zoster, Live 11/04/2005, 11/08/2016, 01/11/2017  . Zoster, Unspecified 01/11/2017   Pertinent  Health Maintenance Due  Topic Date Due  . INFLUENZA VACCINE  Completed  . DEXA SCAN  Completed      04/15/2022   10:15 AM 04/27/2022   11:35 AM 05/31/2022    9:34 AM 07/26/2022    9:31 AM 10/21/2022    9:31 AM  Fall Risk  Falls in the past year? 0 0 0 0 0  Was there an injury with Fall? 0 0 0 0 0  Fall Risk Category Calculator 0 0 0 0 0  Fall Risk Category (Retired) Low Low     (RETIRED) Patient Fall Risk Level Moderate fall risk Moderate fall risk     Patient at Risk for Falls Due to History of fall(s) History of fall(s) History of fall(s)  No Fall Risks  Fall risk Follow up Falls evaluation completed Falls evaluation completed Falls evaluation completed  Falls evaluation completed     Vitals:   02/07/23 1434  BP: (!) 148/78  Pulse: 74  Resp: 16  Temp: (!) 97.4 F (36.3 C)  SpO2: 97%  Weight: 282 lb 9.6 oz (128.2 kg)  Height: 5\' 8"  (1.727 m)   Body mass index is 42.97 kg/m.  Physical Exam Constitutional:      General: She is not in acute distress.    Appearance: She is obese.     Comments: Morbidly obese.  HENT:     Head: Normocephalic and atraumatic.     Nose: Nose normal.     Mouth/Throat:     Mouth: Mucous membranes are moist.  Eyes:     Conjunctiva/sclera: Conjunctivae normal.  Cardiovascular:     Rate and Rhythm: Normal rate and regular rhythm.  Pulmonary:     Effort: Pulmonary effort is normal.     Breath sounds: Normal breath sounds.   Abdominal:     General: Bowel sounds are normal.     Palpations: Abdomen is soft.  Musculoskeletal:     Cervical back: Normal range of motion.  Skin:    General: Skin is warm and dry.     Findings: Rash present.     Comments: Bilateral forearm erythematous rashes  Neurological:     General: No focal deficit present.     Mental Status: She is alert  and oriented to person, place, and time.  Psychiatric:        Mood and Affect: Mood normal.        Behavior: Behavior normal.        Thought Content: Thought content normal.        Judgment: Judgment normal.     Labs reviewed: Recent Labs    07/04/22 0502 07/05/22 0538 07/06/22 0702 07/08/22 1714 07/12/22 0437 07/13/22 0412 10/19/22 0339 10/25/22 0000 01/10/23 1938 01/11/23 0500 01/16/23 0000  NA 134* 135 136   < > 133*   < > 134*   < > 135 136 136*  K 4.2 3.7 3.4*   < > 3.0*   < > 3.7   < > 3.8 3.4* 3.4*  CL 104 106 109   < > 102   < > 107   < > 107 108 108  CO2 20* 19* 19*   < > 20*   < > 21*   < > 21* 21* 21  GLUCOSE 96 94 106*   < > 113*   < > 88  --  105* 87  --   BUN 25* 24* 20   < > 20   < > 42*   < > 31* 29* 20  CREATININE 1.21* 1.14* 1.01*   < > 1.21*   < > 2.04*   < > 2.10* 1.95* 2.0*  CALCIUM 8.2* 8.4* 8.4*   < > 7.7*   < > 7.9*   < > 8.7* 8.4* 8.6*  MG 2.4 2.4 2.4  --  1.8  --   --   --   --   --   --   PHOS 3.6 4.3 3.6  --   --   --   --   --   --   --   --    < > = values in this interval not displayed.   Recent Labs    07/06/22 0702 07/21/22 0000 10/13/22 1855 11/15/22 0000 01/10/23 0000 01/10/23 1938  AST 36   < > 20 19 15 21   ALT 25   < > 17 12 10 17   ALKPHOS 63   < > 85 84 83 87  BILITOT 0.3  --  1.1  --   --  0.6  PROT 6.4*  --  7.0  --   --  7.3  ALBUMIN 2.3*   < > 2.7* 3.3* 3.3* 3.5   < > = values in this interval not displayed.   Recent Labs    10/19/22 0339 11/15/22 0000 01/10/23 0000 01/10/23 1938 01/11/23 0500 01/16/23 0000  WBC 11.9*   < > 6.0 6.5 6.1 7.5  NEUTROABS  --    < >  3,168.00 3.6  --  4,770.00  HGB 11.5*   < > 10.6* 11.8* 10.8* 10.7*  HCT 37.1   < > 33* 39.4 35.3* 34*  MCV 89.6  --   --  89.5 89.1  --   PLT 261   < > 244 272 230 225   < > = values in this interval not displayed.   Lab Results  Component Value Date   TSH 2.119 01/10/2023   No results found for: "HGBA1C" No results found for: "CHOL", "HDL", "LDLCALC", "LDLDIRECT", "TRIG", "CHOLHDL"  Significant Diagnostic Results in last 30 days:  CT ABDOMEN PELVIS WO CONTRAST  Result Date: 01/10/2023 CLINICAL DATA:  Left lower quadrant pain. UTI detected on outside urinalysis but she also has  a history of diverticulitis. EXAM: CT ABDOMEN AND PELVIS WITHOUT CONTRAST TECHNIQUE: Multidetector CT imaging of the abdomen and pelvis was performed following the standard protocol without IV contrast. RADIATION DOSE REDUCTION: This exam was performed according to the departmental dose-optimization program which includes automated exposure control, adjustment of the mA and/or kV according to patient size and/or use of iterative reconstruction technique. COMPARISON:  Limited comparison is available with a CTA chest 07/02/2022 and postmyelogram CT lumbar spine dated 04/06/2020. There is no prior abdominopelvic dedicated imaging. FINDINGS: Lower chest: Lung bases demonstrate scattered linear scarring or atelectasis without infiltrates. Interval resolution of the small pleural effusions noted on 07/02/2022. A moderate-sized hiatal hernia is again shown. The cardiac size is normal. There are calcifications in the right coronary artery. No pericardial fluid. Hepatobiliary: The liver is 21 cm length and mildly steatotic. No mass is seen without contrast. Gallbladder is absent and there is no biliary dilatation. Pancreas: The gland is moderately atrophic. No masses seen without contrast or ductal dilatation. Spleen: Unremarkable without contrast.  No splenomegaly. Adrenals/Urinary Tract: No adrenal mass. Cortical thinning noted  somewhat in both kidneys. Small length measurement of the right kidney of 7.4 cm with low-normal length measurement on the left at 9 cm. There is no contour deforming abnormality of either kidney. There is no urinary stone or obstruction. Small bilateral extrarenal pelves are again shown. There is no thickening of the bladder wall. Stomach/Bowel: Moderate-sized hiatal hernia. No dilatation or wall thickening including the appendix. There is diffuse diverticulosis distal transverse and left-sided colon. There are faint stranding changes along side the distal third of the descending colon which would either represent a mild uncomplicated diverticulitis or fat scarring from prior diverticular disease. A mild diverticulitis is favored in this case. There is no diverticular abscess or free air. Vascular/Lymphatic: Aortic atherosclerosis. No enlarged abdominal or pelvic lymph nodes. Reproductive: Uterus and bilateral adnexa are unremarkable. Other: No free fluid, free air, free hemorrhage or incarcerated hernia. Small umbilical fat hernia. Musculoskeletal: Old spinous process fusion hardware L3-4 and L4-5 with dorsal decompression at L5-S1 and transitional anatomy with partial lumbarization of S1. Multilevel degenerative discs with vacuum phenomenon. Spondylosis. There is chronic wedging of L1 and 3, chronic and unchanged minimal retrolisthesis at L1-2, minimal anterolisthesis at L3-4, L4-5 and L5-S1, with multilevel acquired foraminal stenosis. Comparison is also made to the right hip CT from 07/11/2022. Redemonstration of removal of hardware from the prior right hip arthroplasty is again noted, similar appearance of fluid and granulation tissue in the right hip bed with again noted cephalad migration of the right femoral shaft into the area. There is healed fracture deformity along the right acetabular roof with acetabular remodeling. All of these findings were present previously and unchanged. No destructive osseous  lesion is evident or new abnormality. There is chronic atrophy in the dorsal paraspinous muscles and right iliopsoas and gluteal musculature. IMPRESSION: 1. Diffuse left-sided colonic diverticulosis, with faint stranding changes alongside the distal third of the descending colon which would either represent a mild uncomplicated diverticulitis or fat scarring from prior diverticular disease. A mild diverticulitis is favored in this case. No diverticular abscess or free air. 2. No urinary stone or obstruction. 3. Cortical thinning in both kidneys with small right kidney and low-normal left kidney length. No urinary stone or obstruction. No bladder thickening. 4. Aortic and coronary artery atherosclerosis. 5. Moderate-sized hiatal hernia. 6. Mildly prominent liver with mild steatosis. 7. Small umbilical fat hernia. 8. Chronic changes in the right hip  with removal of hardware from the prior right hip arthroplasty, and right-sided muscular atrophy described above. 9. Degenerative and chronic postsurgical changes lumbar spine with osteopenia and chronic wedging of L1 and 3. Aortic Atherosclerosis (ICD10-I70.0). Electronically Signed   By: Almira Bar M.D.   On: 01/10/2023 21:40    Assessment/Plan  1. Chronic heart failure with preserved ejection fraction (HFpEF) (HCC) -  no SOB, stable -   Continue Lasix and KCl  2. Acute bronchitis, unspecified organism -  no wheezing, has cough -   Continue doxycycline  3. Chronic anemia Lab Results  Component Value Date   HGB 10.7 (A) 01/16/2023   -  stable -  Continue ferrous sulfate  4. CKD (chronic kidney disease) stage 4, GFR 15-29 ml/min (HCC) Lab Results  Component Value Date   NA 136 (A) 01/16/2023   K 3.4 (A) 01/16/2023   CO2 21 01/16/2023   GLUCOSE 87 01/11/2023   BUN 20 01/16/2023   CREATININE 2.0 (A) 01/16/2023   CALCIUM 8.6 (A) 01/16/2023   EGFR 24 01/16/2023   GFRNONAA 25 (L) 01/11/2023    -  stable  5. DNR (do not resuscitate) - Do  not attempt resuscitation (DNR)  6.  Acquired hypothyroidism Lab Results  Component Value Date   TSH 2.119 01/10/2023   -Continue levothyroxine  7. Drug rash -  has rashes on bilateral forearms, possibly due to reaction fro an previous antibiotic -  will start on Triamcinolone cream 0.5% apply to rashes on bilateral forearms BID X 2 weeks   Family/ staff Communication: Discussed plan of care with resident and charge nurse.  Labs/tests ordered:  tsh, BMP, CBC    Kenard Gower, DNP, MSN, FNP-BC [facility] [phone] (Monday-Friday 8:00 a.m. - 5:00 p.m.) [phone] (after hours)

## 2023-02-09 LAB — BASIC METABOLIC PANEL
BUN: 37 — AB (ref 4–21)
CO2: 18 (ref 13–22)
Chloride: 110 — AB (ref 99–108)
Creatinine: 2 — AB (ref 0.5–1.1)
Glucose: 75
Potassium: 4.4 meq/L (ref 3.5–5.1)
Sodium: 137 (ref 137–147)

## 2023-02-09 LAB — CBC AND DIFFERENTIAL
HCT: 36 (ref 36–46)
Hemoglobin: 11.4 — AB (ref 12.0–16.0)
Platelets: 345 10*3/uL (ref 150–400)
WBC: 8

## 2023-02-09 LAB — COMPREHENSIVE METABOLIC PANEL: Calcium: 9 (ref 8.7–10.7)

## 2023-02-09 LAB — CBC: RBC: 4.19 (ref 3.87–5.11)

## 2023-02-17 LAB — BASIC METABOLIC PANEL
BUN: 41 — AB (ref 4–21)
CO2: 20 (ref 13–22)
Chloride: 110 — AB (ref 99–108)
Creatinine: 2.1 — AB (ref 0.5–1.1)
Glucose: 76
Potassium: 4.4 meq/L (ref 3.5–5.1)
Sodium: 139 (ref 137–147)

## 2023-02-17 LAB — COMPREHENSIVE METABOLIC PANEL: Calcium: 8.8 (ref 8.7–10.7)

## 2023-02-20 ENCOUNTER — Non-Acute Institutional Stay (INDEPENDENT_AMBULATORY_CARE_PROVIDER_SITE_OTHER): Payer: Medicare Other | Admitting: Nurse Practitioner

## 2023-02-20 DIAGNOSIS — Z0000 Encounter for general adult medical examination without abnormal findings: Secondary | ICD-10-CM | POA: Diagnosis not present

## 2023-02-21 ENCOUNTER — Encounter: Payer: Self-pay | Admitting: Nurse Practitioner

## 2023-02-21 NOTE — Progress Notes (Signed)
Subjective:   Madison [name] is a 82 y.o. female who presents for Medicare Annual (Subsequent) preventive examination.  Visit Complete: In person  Patient Medicare AWV questionnaire was completed by the patient on 02/20/23; I have confirmed that all information answered by patient is correct and no changes since this date.  Cardiac Risk Factors include: advanced age (>13men, >3 women);obesity (BMI >30kg/m2);sedentary lifestyle     Objective:    Today's Vitals   02/20/23 1421  BP: 135/69  Pulse: 71  Resp: 16  Temp: (!) 97.4 F (36.3 C)  SpO2: 97%  Weight: 282 lb 9.6 oz (128.2 kg)   Body mass index is 42.97 kg/m.     02/07/2023    2:42 PM 02/02/2023    4:17 PM 01/13/2023    4:13 PM 01/12/2023   10:14 AM 01/11/2023   11:19 AM 01/05/2023    3:33 PM 11/22/2022   10:36 AM  Advanced Directives  Does Patient Have a Medical Advance Directive? Yes Yes Yes Yes No    Type of Advance Directive Living will;Out of facility DNR (pink MOST or yellow form) Living will;Out of facility DNR (pink MOST or yellow form) Living will;Out of facility DNR (pink MOST or yellow form) Living will;Out of facility DNR (pink MOST or yellow form) Out of facility DNR (pink MOST or yellow form);Living will;Healthcare Power of Attorney Living will;Out of facility DNR (pink MOST or yellow form) Living will;Out of facility DNR (pink MOST or yellow form)  Does patient want to make changes to medical advance directive? No - Patient declined No - Patient declined No - Patient declined No - Patient declined No - Patient declined No - Patient declined No - Patient declined  Copy of Healthcare Power of Attorney in Chart?     No - copy requested    Would patient like information on creating a medical advance directive? No - Patient declined No - Patient declined   No - Patient declined    Pre-existing out of facility DNR order (yellow form or pink MOST form) Yellow form placed in chart (order not valid for inpatient use)  Yellow form placed in chart (order not valid for inpatient use) Yellow form placed in chart (order not valid for inpatient use) Yellow form placed in chart (order not valid for inpatient use)  Yellow form placed in chart (order not valid for inpatient use) Yellow form placed in chart (order not valid for inpatient use)    Current Medications (verified) Outpatient Encounter Medications as of 02/20/2023  Medication Sig   albuterol (VENTOLIN HFA) 108 (90 Base) MCG/ACT inhaler Inhale 2 puffs into the lungs every 6 (six) hours as needed for wheezing or shortness of breath.   bifidobacterium infantis (ALIGN) capsule Take 1 capsule by mouth daily.   Calcium Carb-Cholecalciferol (CALCIUM PLUS VITAMIN D3 PO) Take 1 tablet by mouth daily. Vitamin D3 + Calcium 600   Cholecalciferol (VITAMIN D3) 50 MCG (2000 UT) TABS Take 2 tablets by mouth daily.   ferrous sulfate 325 (65 FE) MG EC tablet Take 325 mg by mouth as directed. Once A Day on Mon, Thu   furosemide (LASIX) 20 MG tablet Take 20 mg by mouth daily.   guaiFENesin 200 MG/10ML LIQD Take 10 mLs by mouth every 6 (six) hours as needed.   ipratropium-albuterol (DUONEB) 0.5-2.5 (3) MG/3ML SOLN Inhale 3 mLs into the lungs every 8 (eight) hours as needed (wheezing).   levothyroxine (SYNTHROID) 25 MCG tablet Take 25 mcg by mouth as  directed. Take 1 tablet (25 mcg) along with 75 mcg=100 mcg   levothyroxine (SYNTHROID) 75 MCG tablet Take 75 mcg by mouth as directed. Take 1 tablet (75 mcg) along with 25 mcg=100 mcg   loperamide (IMODIUM) 2 MG capsule Take 2 mg by mouth every 4 (four) hours as needed for diarrhea or loose stools.   nystatin (MYCOSTATIN/NYSTOP) powder Apply 1 application  topically in the morning, at noon, in the evening, and at bedtime. To genital areas and inner thigh   nystatin cream (MYCOSTATIN) Apply 1 Application topically at bedtime.   ondansetron (ZOFRAN) 4 MG tablet Take 4 mg by mouth every 8 (eight) hours as needed for nausea or  vomiting.   pantoprazole (PROTONIX) 40 MG tablet Take 40 mg by mouth daily.   potassium chloride (MICRO-K) 10 MEQ CR capsule Take 10 mEq by mouth once.   triamcinolone cream (KENALOG) 0.5 % Apply 1 Application topically at bedtime.   No facility-administered encounter medications on file as of 02/20/2023.    Allergies (verified) Keflex [cephalexin], Macrobid [nitrofurantoin], Amoxicillin, and Cefadroxil   History: Past Medical History:  Diagnosis Date   Benign hypertension with chronic kidney disease, stage III (HCC)    Overview:  Last Assessment & Plan:  Usually the patient is hypertensive. However today her blood pressure is 105 systolic. She has been feeling fatigued with some lightheadedness. Part of this may be related to her lower blood pressure. Her pressure may be improved with her decrease in salt and fluid intake. I've instructed her to put her lisinopril/hctz on hold. 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 It was possibly reentrant. She does well with diltiazem. No change in therapy.   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   TOTAL KNEE ARTHROPLASTY Right 06/13/2011   Family History  Problem Relation Age of Onset   High blood pressure Mother    Diabetes Mother    Heart Problems Father    Diabetes Father    Prostate cancer Father    Heart attack Father    Heart attack Paternal Grandmother  Heart attack Maternal Grandfather    Heart attack Paternal Uncle    Stroke Neg Hx    Social History   Socioeconomic History   Marital status: Married    Spouse name: Not on file   Number of children: Not on file   Years of education: Not on file   Highest education level: Not on file  Occupational History   Not on file  Tobacco Use   Smoking status: Former    Passive exposure: Never   Smokeless tobacco: Never  Vaping Use   Vaping status: Never Used  Substance and Sexual Activity   Alcohol use: No   Drug use: No   Sexual activity: Not Currently  Other Topics Concern   Not on file  Social History Narrative   Not on file   Social Determinants of Health   Financial Resource Strain: Low Risk  (01/16/2021)   Received [facility], [facility]   Overall Financial Resource Strain (CARDIA)    Difficulty of Paying Living Expenses: Not hard at all  Food Insecurity: No Food Insecurity (01/11/2023)   Hunger Vital Sign    Worried About Running Out of Food in the Last Year: Never true    Ran Out of Food in the Last Year: Never true  Transportation Needs: No Transportation Needs (01/11/2023)   PRAPARE - Administrator, Civil Service (Medical): No    Lack of Transportation (Non-Medical): No   Physical Activity: Not on file  Stress: No Stress Concern Present (01/13/2021)   Received from [facility], [facility] - Occupational Stress Questionnaire    Feeling of Stress : Not at all  Social Connections: Unknown (08/28/2021)   Received from [facility], [facility]   Social Network    Social Network: Not on file    Tobacco Counseling Counseling given: Not Answered   Clinical Intake:  Pre-visit preparation completed: Yes  Pain : No/denies pain     Nutritional Status: BMI > 30  Obese Nutritional Risks: None (intentional weight loss) Diabetes: No  How often do you need to have someone help you when you read instructions, pamphlets, or other written materials from your doctor or pharmacy?: 3 - Sometimes What is the last grade level you completed in school?: college  Interpreter Needed?: No  Information entered by :: Terrez Ander Nedra Hai NP   Activities of Daily Living    02/21/2023   11:30 AM 01/11/2023   11:26 AM  In your present state of health, do you have any difficulty performing the following activities:  Hearing? 1   Comment hearing aids   Vision? 0   Difficulty concentrating or making decisions? 0   Walking or climbing stairs? 1   Dressing or bathing? 1   Doing errands, shopping? 1 1  Preparing Food and eating ? N   Using the Toilet? Y   In the past six months, have you accidently leaked urine? Y   Do you have problems with loss of bowel control? N   Managing your Medications? Y   Managing your Finances? Y   Housekeeping or managing your Housekeeping? Y     Patient Care Team: Heffner, Jimmye Norman, MD as PCP - General (Family Medicine) [name], MD as PCP - Cardiology (Cardiology) [name], MD as PCP - Sleep Medicine (Cardiology) [name], MD (Sports Medicine) [name], MD (Ophthalmology) [name], [name], MD (Dermatology) [name], MD  (Gastroenterology)  [name], MD (Nephrology) [name], DC as Referring Physician (Chiropractic Medicine)  Indicate any recent Medical Services you may have received from other than [facility] providers in the past year (date may be approximate).     Assessment:   This is a routine wellness examination for Zamyiah.  Hearing/Vision screen No results found.   Goals Addressed             This Visit's Progress    Weight (lb) < 200 lb (90.7 kg)   282 lb 9.6 oz (128.2 kg)      Depression Screen    02/21/2023   11:32 AM 07/26/2022    9:31 AM 05/31/2022    9:34 AM 04/27/2022   11:35 AM 04/15/2022   10:16 AM  PHQ 2/9 Scores  PHQ - 2 Score 0 0 0 0 0    Fall Risk    10/21/2022    9:31 AM 07/26/2022    9:31 AM 05/31/2022    9:34 AM 04/27/2022   11:35 AM 04/15/2022   10:15 AM  Fall Risk   Falls in the past year? 0 0 0 0 0  Number falls in past yr: 0 0 0 0 0  Injury with Fall? 0 0 0 0 0  Risk for fall due to : No Fall Risks  History of fall(s) History of fall(s) History of fall(s)  Follow up Falls evaluation completed  Falls evaluation completed Falls evaluation completed Falls evaluation completed    MEDICARE RISK AT HOME: Medicare Risk at Home Any stairs in or around the home?: Yes If so, are there any without handrails?: No Home free of loose throw rugs in walkways, pet beds, electrical cords, etc?: Yes Adequate lighting in your home to reduce risk of falls?: Yes Life alert?: No Use of a cane, walker or w/c?: Yes Grab bars in the bathroom?: Yes Shower chair or bench in shower?: Yes Elevated toilet seat or a handicapped toilet?: Yes  TIMED UP AND GO:  Was the test performed?  No    Cognitive Function:        Immunizations Immunization History  Administered Date(s) Administered   Influenza Split 01/28/2008, 02/17/2009, 02/20/2012, 02/11/2013, 02/02/2016, 01/11/2017, 02/04/2019   Influenza, High Dose Seasonal PF 02/04/2015, 01/11/2017, 01/30/2018, 02/05/2020,  01/25/2023   Influenza-Unspecified 02/16/2022   Moderna SARS-COV2 Booster Vaccination 02/24/2022   Moderna Sars-Covid-2 Vaccination 05/09/2019, 05/27/2019, 03/03/2020, 09/04/2020, 11/04/2020   Pfizer Covid-19 Vaccine Bivalent Booster 32yrs & up 02/09/2021   Pneumococcal Conjugate-13 08/30/2013   Pneumococcal Polysaccharide-23 11/03/2005   RSV,unspecified 04/22/2022   Td 08/01/2001, 09/05/2019   Tdap 09/22/2010, 11/24/2020   Zoster Recombinant(Shingrix) 11/08/2016, 01/11/2017   Zoster, Live 11/04/2005, 11/08/2016, 01/11/2017   Zoster, Unspecified 01/11/2017    TDAP status: Up to date  Flu Vaccine status: Up to date  Pneumococcal vaccine status: Up to date  Covid-19 vaccine status: Declined, Education has been provided regarding the importance of this vaccine but patient still declined. Advised may receive this vaccine at local pharmacy or Health Dept.or vaccine clinic. Aware to provide a copy of the vaccination record if obtained from local pharmacy or Health Dept. Verbalized acceptance and understanding.  Qualifies for Shingles Vaccine? Yes   Zostavax completed Yes   Shingrix Completed?: Yes  Screening Tests Health Maintenance  Topic Date Due   COVID-19 Vaccine (6 - 2023-24 season) 04/24/2023 (Originally 12/25/2022)   Medicare Annual Wellness (AWV)  02/20/2024   DTaP/Tdap/Td (5 - Td or Tdap) 11/25/2030   Pneumonia Vaccine 86+ Years old  Completed   INFLUENZA VACCINE  Completed   DEXA SCAN  Completed   Zoster Vaccines- Shingrix  Completed   HPV VACCINES  Aged Out    Health Maintenance  There are no preventive care reminders to display for this patient.   Colorectal cancer screening: No longer required.   Mammogram status: No longer required due to aged out.  Bone Density status: Completed 09/17/14. Results reflect: Bone density results: OSTEOPENIA. Repeat every never per the patient years.  Lung Cancer Screening: (Low Dose CT Chest recommended if Age 76-80 years, 20  pack-year currently smoking OR have quit w/in 15years.) does not qualify.    Additional Screening:  Hepatitis C Screening: does not qualify;   Vision Screening: Recommended annual ophthalmology exams for early detection of glaucoma and other disorders of the eye. Is the patient up to date with their annual eye exam?  No  Who is the provider or what is the name of the office in which the patient attends annual eye exams? Declined f/u eye exam If pt is not established with a provider, would they like to be referred to a provider to establish care? No .   Dental Screening: Recommended annual dental exams for proper oral hygiene  Diabetic Foot Exam: NA  Community Resource Referral / Chronic Care Management: CRR required this visit?  No   CCM required this visit?  No     Plan:     I have personally reviewed and noted the following in the patient's chart:   Medical and social history Use of alcohol, tobacco or illicit drugs  Current medications and supplements including opioid prescriptions. Patient is not currently taking opioid prescriptions. Functional ability and status Nutritional status Physical activity Advanced directives List of other physicians Hospitalizations, surgeries, and ER visits in previous 12 months Vitals Screenings to include cognitive, depression, and falls Referrals and appointments  In addition, I have reviewed and discussed with patient certain preventive protocols, quality metrics, and best practice recommendations. A written personalized care plan for preventive services as well as general preventive health recommendations were provided to patient.     Muhamad Serano X Keara Pagliarulo, NP   02/21/2023   After Visit Summary: (In Person-Declined) Patient declined AVS at this time.

## 2023-02-22 ENCOUNTER — Encounter: Payer: Self-pay | Admitting: Nurse Practitioner

## 2023-02-22 DIAGNOSIS — G3184 Mild cognitive impairment, so stated: Secondary | ICD-10-CM | POA: Insufficient documentation

## 2023-02-24 ENCOUNTER — Encounter: Payer: Self-pay | Admitting: Sports Medicine

## 2023-02-24 ENCOUNTER — Non-Acute Institutional Stay (SKILLED_NURSING_FACILITY): Payer: Medicare Other | Admitting: Sports Medicine

## 2023-02-24 DIAGNOSIS — L03031 Cellulitis of right toe: Secondary | ICD-10-CM | POA: Diagnosis not present

## 2023-02-24 NOTE — Progress Notes (Unsigned)
Provider:   Andree Coss Location:   [facility]    Place of Service:   Skilled care   PCP: Heffner, Jimmye Norman, MD Patient Care Team: Heffner, Jimmye Norman, MD as PCP - General (Family Medicine) [name], MD as PCP - Cardiology (Cardiology) [name], MD as PCP - Sleep Medicine (Cardiology) [name], MD (Sports Medicine) [name], MD (Ophthalmology) [name], [name], MD (Dermatology) [name], MD (Gastroenterology) [name], MD (Nephrology) [name], DC as Referring Physician (Chiropractic Medicine)  Extended Emergency Contact Information Primary Emergency Contact: [employer] Address: [address]          [place], [place] [address] [name] of [place] Home Phone: [phone] Mobile Phone: [phone] Relation: Spouse  Code Status:  Goals of Care: Advanced Directive information    02/07/2023    2:42 PM  Advanced Directives  Does Patient Have a Medical Advance Directive? Yes  Type of Advance Directive Living will;Out of facility DNR (pink MOST or yellow form)  Does patient want to make changes to medical advance directive? No - Patient declined  Would patient like information on creating a medical advance directive? No - Patient declined  Pre-existing out of facility DNR order (yellow form or pink MOST form) Yellow form placed in chart (order not valid for inpatient use)      No chief complaint on file.   HPI: Patient is a 82 y.o. female seen today for  acute visit for Rt toe pain. She is accompanied by her husband. Pt states that since few days she is having the nail bed on her Rt 4th toe and her toe nail is grown. Reports that its hurting when trying to put on socks. Pt denies fevers, chills, cough, SOB, abdominal pain, nausea, vomiting, dysuria, hematuria, bloody or dark stools.   Past Medical History:  Diagnosis Date   Benign hypertension with chronic kidney  disease, stage III (HCC)    Overview:  Last Assessment & Plan:  Usually the patient is hypertensive. However today her blood pressure is 105 systolic. She has been feeling fatigued with some lightheadedness. Part of this may be related to her lower blood pressure. Her pressure may be improved with her decrease in salt and fluid intake. I've instructed her to put her lisinopril/hctz on hold. 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 It was possibly reentrant. She does well with diltiazem. No change in therapy.   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 She has never  been exposed to tobacco smoke. She has never used smokeless tobacco. She reports that she does not drink alcohol and does not use drugs. Social History   Socioeconomic History   Marital status: Married    Spouse name: Not on file   Number of children: Not on file   Years of education: Not on file   Highest education level: Not on file  Occupational History   Not on file  Tobacco Use   Smoking status: Former    Passive exposure: Never   Smokeless tobacco: Never  Vaping Use   Vaping status: Never Used  Substance and Sexual Activity   Alcohol use: No   Drug use: No   Sexual activity: Not Currently  Other Topics Concern   Not on file  Social History Narrative   Not on file   Social Determinants of Health   Financial Resource Strain: Low Risk  (01/16/2021)   Received from [facility], [facility]   Overall Financial Resource Strain (CARDIA)    Difficulty of Paying Living Expenses: Not hard at all  Food Insecurity: No Food Insecurity (01/11/2023)   Hunger Vital Sign    Worried About Running Out of Food in the Last Year: Never true    Ran Out of Food in the Last Year: Never true  Transportation Needs: No Transportation Needs (01/11/2023)   PRAPARE - Administrator, Civil Service (Medical): No    Lack of Transportation (Non-Medical): No  Physical Activity: Not on file  Stress: No Stress Concern Present (01/13/2021)   Received from [facility], [facility] - Occupational Stress Questionnaire    Feeling of Stress : Not at all  Social Connections: Unknown (08/28/2021)   Received from [facility], [facility]   Social Network    Social Network: Not on file  Intimate Partner Violence: Not At Risk (01/11/2023)   Humiliation, Afraid, Rape, and Kick questionnaire    Fear of Current or Ex-Partner: No    Emotionally Abused: No    Physically Abused: No    Sexually Abused: No    Functional Status Survey:    Family  History  Problem Relation Age of Onset   High blood pressure Mother    Diabetes Mother    Heart Problems Father    Diabetes Father    Prostate cancer Father    Heart attack Father    Heart attack Paternal Grandmother    Heart attack Maternal Grandfather    Heart attack Paternal Uncle    Stroke Neg Hx     Health Maintenance  Topic Date Due   COVID-19 Vaccine (6 - 2023-24 season) 04/24/2023 (Originally 12/25/2022)   Medicare Annual Wellness (AWV)  02/20/2024   DTaP/Tdap/Td (5 - Td or Tdap) 11/25/2030   Pneumonia Vaccine 73+ Years old  Completed   INFLUENZA VACCINE  Completed   DEXA SCAN  Completed   Zoster Vaccines- Shingrix  Completed  HPV VACCINES  Aged Out    Allergies  Allergen Reactions   Keflex [Cephalexin] Diarrhea   Macrobid [Nitrofurantoin] Hives   Amoxicillin Rash   Cefadroxil Rash    Outpatient Encounter Medications as of 02/24/2023  Medication Sig   albuterol (VENTOLIN HFA) 108 (90 Base) MCG/ACT inhaler Inhale 2 puffs into the lungs every 6 (six) hours as needed for wheezing or shortness of breath.   bifidobacterium infantis (ALIGN) capsule Take 1 capsule by mouth daily.   Calcium Carb-Cholecalciferol (CALCIUM PLUS VITAMIN D3 PO) Take 1 tablet by mouth daily. Vitamin D3 + Calcium 600   Cholecalciferol (VITAMIN D3) 50 MCG (2000 UT) TABS Take 2 tablets by mouth daily.   ferrous sulfate 325 (65 FE) MG EC tablet Take 325 mg by mouth as directed. Once A Day on Mon, Thu   furosemide (LASIX) 20 MG tablet Take 20 mg by mouth daily.   guaiFENesin 200 MG/10ML LIQD Take 10 mLs by mouth every 6 (six) hours as needed.   ipratropium-albuterol (DUONEB) 0.5-2.5 (3) MG/3ML SOLN Inhale 3 mLs into the lungs every 8 (eight) hours as needed (wheezing).   levothyroxine (SYNTHROID) 25 MCG tablet Take 25 mcg by mouth as directed. Take 1 tablet (25 mcg) along with 75 mcg=100 mcg   levothyroxine (SYNTHROID) 75 MCG tablet Take 75 mcg by mouth as directed. Take 1 tablet (75 mcg) along  with 25 mcg=100 mcg   loperamide (IMODIUM) 2 MG capsule Take 2 mg by mouth every 4 (four) hours as needed for diarrhea or loose stools.   nystatin (MYCOSTATIN/NYSTOP) powder Apply 1 application  topically in the morning, at noon, in the evening, and at bedtime. To genital areas and inner thigh   nystatin cream (MYCOSTATIN) Apply 1 Application topically at bedtime.   ondansetron (ZOFRAN) 4 MG tablet Take 4 mg by mouth every 8 (eight) hours as needed for nausea or vomiting.   pantoprazole (PROTONIX) 40 MG tablet Take 40 mg by mouth daily.   potassium chloride (MICRO-K) 10 MEQ CR capsule Take 10 mEq by mouth once.   triamcinolone cream (KENALOG) 0.5 % Apply 1 Application topically at bedtime.   No facility-administered encounter medications on file as of 02/24/2023.    Review of Systems  Constitutional:  Negative for chills and fever.  HENT:  Negative for sinus pressure and sore throat.   Respiratory:  Negative for cough, shortness of breath and wheezing.   Cardiovascular:  Negative for chest pain, palpitations and leg swelling.  Gastrointestinal:  Negative for abdominal distention, abdominal pain, blood in stool, constipation, diarrhea, nausea and vomiting.  Genitourinary:  Negative for dysuria, frequency and urgency.  Skin:        Pain around 4th toe nail   Neurological:  Negative for dizziness, weakness and numbness.  Psychiatric/Behavioral:  Negative for confusion.     There were no vitals filed for this visit. There is no height or weight on file to calculate BMI. Physical Exam Constitutional:      Appearance: Normal appearance.  HENT:     Head: Normocephalic and atraumatic.  Cardiovascular:     Rate and Rhythm: Normal rate and regular rhythm.     Heart sounds: No murmur heard. Pulmonary:     Effort: Pulmonary effort is normal. No respiratory distress.     Breath sounds: Normal breath sounds. No wheezing.  Abdominal:     General: Bowel sounds are normal. There is no  distension.     Tenderness: There is no abdominal tenderness. There is  no guarding or rebound.  Musculoskeletal:        General: No swelling or tenderness.     Comments: 4th Rt toe nail  ingrown , erythema around the nail bed Tender to touch    Neurological:     Mental Status: She is alert. Mental status is at baseline.     Labs reviewed: Basic Metabolic Panel: Recent Labs    07/04/22 0502 07/05/22 0538 07/06/22 8295 07/08/22 1714 07/12/22 0437 07/13/22 0412 10/19/22 0339 10/25/22 0000 01/10/23 1938 01/11/23 0500 01/16/23 0000  NA 134* 135 136   < > 133*   < > 134*   < > 135 136 136*  K 4.2 3.7 3.4*   < > 3.0*   < > 3.7   < > 3.8 3.4* 3.4*  CL 104 106 109   < > 102   < > 107   < > 107 108 108  CO2 20* 19* 19*   < > 20*   < > 21*   < > 21* 21* 21  GLUCOSE 96 94 106*   < > 113*   < > 88  --  105* 87  --   BUN 25* 24* 20   < > 20   < > 42*   < > 31* 29* 20  CREATININE 1.21* 1.14* 1.01*   < > 1.21*   < > 2.04*   < > 2.10* 1.95* 2.0*  CALCIUM 8.2* 8.4* 8.4*   < > 7.7*   < > 7.9*   < > 8.7* 8.4* 8.6*  MG 2.4 2.4 2.4  --  1.8  --   --   --   --   --   --   PHOS 3.6 4.3 3.6  --   --   --   --   --   --   --   --    < > = values in this interval not displayed.   Liver Function Tests: Recent Labs    07/06/22 0702 07/21/22 0000 10/13/22 1855 11/15/22 0000 01/10/23 0000 01/10/23 1938  AST 36   < > 20 19 15 21   ALT 25   < > 17 12 10 17   ALKPHOS 63   < > 85 84 83 87  BILITOT 0.3  --  1.1  --   --  0.6  PROT 6.4*  --  7.0  --   --  7.3  ALBUMIN 2.3*   < > 2.7* 3.3* 3.3* 3.5   < > = values in this interval not displayed.   Recent Labs    01/10/23 1938  LIPASE 24   No results for input(s): "AMMONIA" in the last 8760 hours. CBC: Recent Labs    10/19/22 0339 11/15/22 0000 01/10/23 0000 01/10/23 1938 01/11/23 0500 01/16/23 0000  WBC 11.9*   < > 6.0 6.5 6.1 7.5  NEUTROABS  --    < > 3,168.00 3.6  --  4,770.00  HGB 11.5*   < > 10.6* 11.8* 10.8* 10.7*  HCT 37.1   <  > 33* 39.4 35.3* 34*  MCV 89.6  --   --  89.5 89.1  --   PLT 261   < > 244 272 230 225   < > = values in this interval not displayed.   Cardiac Enzymes: Recent Labs    07/06/22 0702  CKTOTAL 12*   BNP: Invalid input(s): "POCBNP" No results found for: "HGBA1C" Lab Results  Component Value Date  TSH 2.119 01/10/2023   Lab Results  Component Value Date   VITAMINB12 738 07/04/2022   Lab Results  Component Value Date   FOLATE 11.3 07/04/2022   Lab Results  Component Value Date   IRON 18 (L) 07/09/2022   TIBC 155 (L) 07/09/2022   FERRITIN 772 (H) 07/09/2022    Imaging and Procedures obtained prior to SNF admission: CT ABDOMEN PELVIS WO CONTRAST  Result Date: 01/10/2023 CLINICAL DATA:  Left lower quadrant pain. UTI detected on outside urinalysis but she also has a history of diverticulitis. EXAM: CT ABDOMEN AND PELVIS WITHOUT CONTRAST TECHNIQUE: Multidetector CT imaging of the abdomen and pelvis was performed following the standard protocol without IV contrast. RADIATION DOSE REDUCTION: This exam was performed according to the departmental dose-optimization program which includes automated exposure control, adjustment of the mA and/or kV according to patient size and/or use of iterative reconstruction technique. COMPARISON:  Limited comparison is available with a CTA chest 07/02/2022 and postmyelogram CT lumbar spine dated 04/06/2020. There is no prior abdominopelvic dedicated imaging. FINDINGS: Lower chest: Lung bases demonstrate scattered linear scarring or atelectasis without infiltrates. Interval resolution of the small pleural effusions noted on 07/02/2022. A moderate-sized hiatal hernia is again shown. The cardiac size is normal. There are calcifications in the right coronary artery. No pericardial fluid. Hepatobiliary: The liver is 21 cm length and mildly steatotic. No mass is seen without contrast. Gallbladder is absent and there is no biliary dilatation. Pancreas: The gland is  moderately atrophic. No masses seen without contrast or ductal dilatation. Spleen: Unremarkable without contrast.  No splenomegaly. Adrenals/Urinary Tract: No adrenal mass. Cortical thinning noted somewhat in both kidneys. Small length measurement of the right kidney of 7.4 cm with low-normal length measurement on the left at 9 cm. There is no contour deforming abnormality of either kidney. There is no urinary stone or obstruction. Small bilateral extrarenal pelves are again shown. There is no thickening of the bladder wall. Stomach/Bowel: Moderate-sized hiatal hernia. No dilatation or wall thickening including the appendix. There is diffuse diverticulosis distal transverse and left-sided colon. There are faint stranding changes along side the distal third of the descending colon which would either represent a mild uncomplicated diverticulitis or fat scarring from prior diverticular disease. A mild diverticulitis is favored in this case. There is no diverticular abscess or free air. Vascular/Lymphatic: Aortic atherosclerosis. No enlarged abdominal or pelvic lymph nodes. Reproductive: Uterus and bilateral adnexa are unremarkable. Other: No free fluid, free air, free hemorrhage or incarcerated hernia. Small umbilical fat hernia. Musculoskeletal: Old spinous process fusion hardware L3-4 and L4-5 with dorsal decompression at L5-S1 and transitional anatomy with partial lumbarization of S1. Multilevel degenerative discs with vacuum phenomenon. Spondylosis. There is chronic wedging of L1 and 3, chronic and unchanged minimal retrolisthesis at L1-2, minimal anterolisthesis at L3-4, L4-5 and L5-S1, with multilevel acquired foraminal stenosis. Comparison is also made to the right hip CT from 07/11/2022. Redemonstration of removal of hardware from the prior right hip arthroplasty is again noted, similar appearance of fluid and granulation tissue in the right hip bed with again noted cephalad migration of the right femoral shaft  into the area. There is healed fracture deformity along the right acetabular roof with acetabular remodeling. All of these findings were present previously and unchanged. No destructive osseous lesion is evident or new abnormality. There is chronic atrophy in the dorsal paraspinous muscles and right iliopsoas and gluteal musculature. IMPRESSION: 1. Diffuse left-sided colonic diverticulosis, with faint stranding changes alongside the distal third  of the descending colon which would either represent a mild uncomplicated diverticulitis or fat scarring from prior diverticular disease. A mild diverticulitis is favored in this case. No diverticular abscess or free air. 2. No urinary stone or obstruction. 3. Cortical thinning in both kidneys with small right kidney and low-normal left kidney length. No urinary stone or obstruction. No bladder thickening. 4. Aortic and coronary artery atherosclerosis. 5. Moderate-sized hiatal hernia. 6. Mildly prominent liver with mild steatosis. 7. Small umbilical fat hernia. 8. Chronic changes in the right hip with removal of hardware from the prior right hip arthroplasty, and right-sided muscular atrophy described above. 9. Degenerative and chronic postsurgical changes lumbar spine with osteopenia and chronic wedging of L1 and 3. Aortic Atherosclerosis (ICD10-I70.0). Electronically Signed   By: Almira Bar M.D.   On: 01/10/2023 21:40    Assessment/Plan  1. Infection of nail bed of toe of right foot Pt c/o pain and tenderness around her Rt 4th toe nail  No discharge  Toe nail ingrown  Will start clindamycin Will refer to podiatry     Family/ staff Communication:  care plan discussed with patient, husband and nurse  Labs/tests ordered:

## 2023-02-27 ENCOUNTER — Encounter: Payer: Self-pay | Admitting: Sports Medicine

## 2023-03-07 ENCOUNTER — Non-Acute Institutional Stay (SKILLED_NURSING_FACILITY): Payer: Medicare Other | Admitting: Nurse Practitioner

## 2023-03-07 ENCOUNTER — Encounter: Payer: Self-pay | Admitting: Nurse Practitioner

## 2023-03-07 DIAGNOSIS — N184 Chronic kidney disease, stage 4 (severe): Secondary | ICD-10-CM | POA: Diagnosis not present

## 2023-03-07 DIAGNOSIS — I5032 Chronic diastolic (congestive) heart failure: Secondary | ICD-10-CM | POA: Diagnosis not present

## 2023-03-07 DIAGNOSIS — D500 Iron deficiency anemia secondary to blood loss (chronic): Secondary | ICD-10-CM

## 2023-03-07 DIAGNOSIS — K2100 Gastro-esophageal reflux disease with esophagitis, without bleeding: Secondary | ICD-10-CM | POA: Diagnosis not present

## 2023-03-07 DIAGNOSIS — L299 Pruritus, unspecified: Secondary | ICD-10-CM

## 2023-03-07 DIAGNOSIS — I10 Essential (primary) hypertension: Secondary | ICD-10-CM

## 2023-03-07 NOTE — Progress Notes (Unsigned)
Location:  [facility] Room Number: 056-A Place of Service:  SNF [phone]) Provider:  Rickey Primus, Jimmye Norman, MD  Patient Care Team: Heffner, Jimmye Norman, MD as PCP - General (Family Medicine) [name], MD as PCP - Cardiology (Cardiology) [name], MD as PCP - Sleep Medicine (Cardiology) [name], MD (Sports Medicine) [name], MD (Ophthalmology) [name], [name], MD (Dermatology) [name], MD (Gastroenterology) [name], MD (Nephrology) [name], DC as Referring Physician (Chiropractic Medicine)  Extended Emergency Contact Information Primary Emergency Contact: [facility] Address: [address]          [place], [place] [address] [name] of [place] Home Phone: [phone] Mobile Phone: [phone] Relation: Spouse  Code Status:  DNR Goals of care: Advanced Directive information    03/07/2023    4:08 PM  Advanced Directives  Does Patient Have a Medical Advance Directive? Yes  Type of Advance Directive Living will;Out of facility DNR (pink MOST or yellow form)  Does patient want to make changes to medical advance directive? No - Patient declined     Chief Complaint  Patient presents with  . Medical Management of Chronic Issues    Routine visit    HPI:  Pt is a 82 y.o. female seen today for medical management of chronic diseases.     Past Medical History:  Diagnosis Date  . Benign hypertension with chronic kidney disease, stage III (HCC)    Overview:  Last Assessment & Plan:  Usually the patient is hypertensive. However today her blood pressure is 105 systolic. She has been feeling fatigued with some lightheadedness. Part of this may be related to her lower blood pressure. Her pressure may be improved with her decrease in salt and fluid intake. I've instructed her to put her lisinopril/hctz on hold. I've asked her to see her primary   . Cardiac  disease 03/10/2014  . Chronic diarrhea 07/27/2015  . Edema 07/27/2015  . Ejection fraction 2004   Normal, echo,   . Gastroesophageal reflux disease   . GERD (gastroesophageal reflux disease)   . Heart disease 03/10/2014  . Hypertension   . Left bundle branch block 09/01/2020  . Obesity   . Obesity (BMI 30-39.9) 09/25/2017  . OSA (obstructive sleep apnea)    mild with AHI 6.75 - on CPAP  . Preop cardiovascular exam 11/2010   Cardiac clearance for knee surgery   . Sleep apnea 03/10/2014  . 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 It was possibly reentrant. She does well with diltiazem. No change in therapy.  . 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 Thyroid disease   . Urinary incontinence    Past Surgical History:  Procedure Laterality Date  . CATARACT EXTRACTION Left 8/11  . CATARACT EXTRACTION Right 3/12  . CHOLECYSTECTOMY  1994  . Copsilotomy Laser Treatment Left 6/12   eye  . ELBOW SURGERY  8/12  . 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 EYE SURGERY Right 12/11  . Lumbar Infusion  2011  . MOLE REMOVAL  06/17/2015  . 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 TOTAL KNEE ARTHROPLASTY Right 06/13/2011    Allergies  Allergen Reactions  . Keflex [Cephalexin] Diarrhea  . Macrobid [Nitrofurantoin] Hives  . Amoxicillin Rash  . Cefadroxil Rash    Outpatient Encounter Medications as of 03/07/2023  Medication Sig  . albuterol (VENTOLIN HFA) 108 (90 Base) MCG/ACT inhaler Inhale 2 puffs into the lungs every 6 (six) hours as needed for wheezing or shortness of breath.  . bifidobacterium infantis (ALIGN) capsule Take 1 capsule by mouth daily.  . Calcium  Carb-Cholecalciferol (CALCIUM PLUS VITAMIN D3 PO) Take 1 tablet by mouth daily. Vitamin D3 + Calcium 600  . Cholecalciferol (VITAMIN D3) 50 MCG (2000 UT) TABS Take 2 tablets by mouth daily.  . ferrous sulfate 325 (65 FE) MG EC tablet Take 325 mg by mouth as directed. Once A Day on Mon, Thu  . furosemide (LASIX) 20 MG tablet Take 20 mg by mouth daily.  Marland Kitchen guaiFENesin 200 MG/10ML LIQD Take 10 mLs by mouth every 4 (four) hours as needed.  Marland Kitchen ipratropium-albuterol (DUONEB) 0.5-2.5 (3) MG/3ML SOLN Inhale 3 mLs into the lungs every 8 (eight) hours as needed (wheezing).  Marland Kitchen levothyroxine (SYNTHROID) 25 MCG tablet Take 25 mcg by mouth as directed. Take 1 tablet (25 mcg) along with 75 mcg=100 mcg  . levothyroxine (SYNTHROID) 75 MCG tablet Take 75 mcg by mouth as directed. Take 1 tablet (75 mcg) along with 25 mcg=100 mcg  . loperamide (IMODIUM) 2 MG capsule Take 2 mg by mouth every 4 (four) hours as needed for diarrhea or loose stools.  . nystatin (MYCOSTATIN/NYSTOP) powder Apply 1 application  topically in the morning, at noon, in the evening, and at bedtime. To genital areas and inner thigh  . nystatin cream (MYCOSTATIN) Apply 1 Application topically at bedtime.  . ondansetron (ZOFRAN) 4 MG tablet Take 4 mg by mouth every 8 (eight) hours as needed for nausea or vomiting.  . pantoprazole (PROTONIX) 40 MG tablet Take 40 mg by mouth daily.  . potassium chloride (MICRO-K) 10 MEQ CR capsule Take 10 mEq by mouth once.  . triamcinolone cream (KENALOG) 0.5 % Apply 1 Application topically at bedtime.   No facility-administered encounter medications on file as of 03/07/2023.    Review of Systems  Immunization History  Administered Date(s) Administered  . Influenza Split 01/28/2008, 02/17/2009, 02/20/2012, 02/11/2013, 02/02/2016, 01/11/2017, 02/04/2019  . Influenza, High Dose Seasonal PF 02/04/2015, 01/11/2017, 01/30/2018, 02/05/2020, 01/25/2023  . Influenza-Unspecified 02/16/2022  . Moderna SARS-COV2  Booster Vaccination 02/24/2022  . Moderna Sars-Covid-2 Vaccination 05/09/2019, 05/27/2019, 03/03/2020, 09/04/2020, 11/04/2020  . [employer], [employer] 64yrs & up 02/09/2021  . Pneumococcal Conjugate-13 08/30/2013  . Pneumococcal Polysaccharide-23 11/03/2005  . RSV,unspecified 04/22/2022  . Td 08/01/2001, 09/05/2019  . Tdap 09/22/2010, 11/24/2020  . Zoster Recombinant(Shingrix) 11/08/2016, 01/11/2017  . Zoster, Live 11/04/2005, 11/08/2016, 01/11/2017  . Zoster, Unspecified 01/11/2017   Pertinent  Health Maintenance Due  Topic Date Due  . INFLUENZA VACCINE  Completed  . DEXA SCAN  Completed      04/15/2022   10:15 AM 04/27/2022   11:35 AM 05/31/2022    9:34 AM 07/26/2022    9:31 AM 10/21/2022    9:31 AM  Fall Risk  Falls in the past year? 0 0 0 0 0  Was there an injury with Fall? 0 0 0 0 0  Fall Risk Category Calculator 0 0 0 0 0  Fall Risk Category (Retired) Low Low     (RETIRED) Patient Fall Risk Level Moderate fall risk Moderate fall risk     Patient at Risk for  Falls Due to History of fall(s) History of fall(s) History of fall(s)  No Fall Risks  Fall risk Follow up Falls evaluation completed Falls evaluation completed Falls evaluation completed  Falls evaluation completed   Functional Status Survey:    Vitals:   03/07/23 1606  BP: (!) 156/71  Pulse: (!) 59  Resp: 14  Temp: (!) 97.2 F (36.2 C)  SpO2: 97%  Weight: 265 lb (120.2 kg)  Height: 5\' 8"  (1.727 m)   Body mass index is 40.29 kg/m. Physical Exam  Labs reviewed: Recent Labs    07/04/22 0502 07/05/22 4098 07/06/22 1191 07/08/22 1714 07/12/22 0437 07/13/22 0412 10/19/22 0339 10/25/22 0000 01/10/23 1938 01/11/23 0500 01/16/23 0000 02/09/23 0000 02/17/23 0000  NA 134* 135 136   < > 133*   < > 134*   < > 135 136 136* 137 139  K 4.2 3.7 3.4*   < > 3.0*   < > 3.7   < > 3.8 3.4* 3.4* 4.4 4.4  CL 104 106 109   < > 102   < > 107   < > 107 108 108 110* 110*  CO2 20* 19* 19*   < > 20*   < >  21*   < > 21* 21* 21 18 20   GLUCOSE 96 94 106*   < > 113*   < > 88  --  105* 87  --   --   --   BUN 25* 24* 20   < > 20   < > 42*   < > 31* 29* 20 37* 41*  CREATININE 1.21* 1.14* 1.01*   < > 1.21*   < > 2.04*   < > 2.10* 1.95* 2.0* 2.0* 2.1*  CALCIUM 8.2* 8.4* 8.4*   < > 7.7*   < > 7.9*   < > 8.7* 8.4* 8.6* 9.0 8.8  MG 2.4 2.4 2.4  --  1.8  --   --   --   --   --   --   --   --   PHOS 3.6 4.3 3.6  --   --   --   --   --   --   --   --   --   --    < > = values in this interval not displayed.   Recent Labs    07/06/22 0702 07/21/22 0000 10/13/22 1855 11/15/22 0000 01/10/23 0000 01/10/23 1938  AST 36   < > 20 19 15 21   ALT 25   < > 17 12 10 17   ALKPHOS 63   < > 85 84 83 87  BILITOT 0.3  --  1.1  --   --  0.6  PROT 6.4*  --  7.0  --   --  7.3  ALBUMIN 2.3*   < > 2.7* 3.3* 3.3* 3.5   < > = values in this interval not displayed.   Recent Labs    10/19/22 0339 11/15/22 0000 01/10/23 0000 01/10/23 1938 01/11/23 0500 01/16/23 0000 02/09/23 0000  WBC 11.9*   < > 6.0 6.5 6.1 7.5 8.0  NEUTROABS  --    < > 3,168.00 3.6  --  4,770.00  --   HGB 11.5*   < > 10.6* 11.8* 10.8* 10.7* 11.4*  HCT 37.1   < > 33* 39.4 35.3* 34* 36  MCV 89.6  --   --  89.5 89.1  --   --   PLT 261   < >  244 272 230 225 345   < > = values in this interval not displayed.   Lab Results  Component Value Date   TSH 2.119 01/10/2023   No results found for: "HGBA1C" No results found for: "CHOL", "HDL", "LDLCALC", "LDLDIRECT", "TRIG", "CHOLHDL"  Significant Diagnostic Results in last 30 days:  No results found.  Assessment/Plan There are no diagnoses linked to this encounter.   Family/ staff Communication: ***  Labs/tests ordered: CBC/diff, CMP/eGFR, P, Mg, urine culture, ACR

## 2023-03-08 ENCOUNTER — Encounter: Payer: Self-pay | Admitting: Nurse Practitioner

## 2023-03-08 NOTE — Assessment & Plan Note (Signed)
stable, on  Pantoprazole, off  Omeprazole, prn Zofran.

## 2023-03-08 NOTE — Assessment & Plan Note (Signed)
Blood pressure is controlled, resumed Furosemide, off Spironolactone.

## 2023-03-08 NOTE — Assessment & Plan Note (Signed)
Chronic urogenital area itching, no apparent rash, responded to steroid cream in the past, will try 1% Hydrocortisone cream every other day hs.

## 2023-03-08 NOTE — Assessment & Plan Note (Signed)
dependent  areas, resumed Furosemide, off Spironolactone, BNP 98 11/15/22

## 2023-03-08 NOTE — Assessment & Plan Note (Signed)
post op, s/p EGD no active bleed. S/p 6 u PRBC transfusion, ASA was dc'd, On Fe, Iron 15 in hospital, Hgb 11.4 02/09/23

## 2023-03-08 NOTE — Assessment & Plan Note (Signed)
f/u Nephrology, Bun/creat 41/2.1 02/17/23, update CBC/diff, CMP/eGFR, ACR, P, Mg, UA C/S prior to appt with Nephrology next week.

## 2023-03-10 ENCOUNTER — Non-Acute Institutional Stay (SKILLED_NURSING_FACILITY): Payer: Medicare Other | Admitting: Sports Medicine

## 2023-03-10 DIAGNOSIS — R8279 Other abnormal findings on microbiological examination of urine: Secondary | ICD-10-CM

## 2023-03-10 DIAGNOSIS — N898 Other specified noninflammatory disorders of vagina: Secondary | ICD-10-CM | POA: Diagnosis not present

## 2023-03-10 NOTE — Progress Notes (Unsigned)
Provider:  Jacalyn Lefevre, MD Location:      Place of Service:     PCP: Magdalene Patricia, MD Patient Care Team: Eulis Foster Jimmye Norman, MD as PCP - General (Family Medicine) [name], MD as PCP - Cardiology (Cardiology) [name], MD as PCP - Sleep Medicine (Cardiology) [name], MD (Sports Medicine) [name], MD (Ophthalmology) [name], [name], MD (Dermatology) [name], MD (Gastroenterology) [name], MD (Nephrology) [name], DC as Referring Physician (Chiropractic Medicine)  Extended Emergency Contact Information Primary Emergency Contact: [facility] Address: [address]          [place], [place] [address] [name] of [place] Home Phone: [phone] Mobile Phone: [phone] Relation: Spouse  Code Status:  Goals of Care: Advanced Directive information    03/07/2023    4:08 PM  Advanced Directives  Does Patient Have a Medical Advance Directive? Yes  Type of Advance Directive Living will;Out of facility DNR (pink MOST or yellow form)  Does patient want to make changes to medical advance directive? No - Patient declined      No chief complaint on file.   HPI: Patient is a 82 y.o. female seen today for   Past Medical History:  Diagnosis Date   Benign hypertension with chronic kidney disease, stage III (HCC)    Overview:  Last Assessment & Plan:  Usually the patient is hypertensive. However today her blood pressure is 105 systolic. She has been feeling fatigued with some lightheadedness. Part of this may be related to her lower blood pressure. Her pressure may be improved with her decrease in salt and fluid intake. I've instructed her to put her lisinopril/hctz on hold. I've asked her to see her primary    Cardiac disease 03/10/2014   Chronic diarrhea 07/27/2015   Edema 07/27/2015   Ejection fraction 2004   Normal, echo,    Gastroesophageal reflux disease    GERD  (gastroesophageal reflux disease)    Heart disease 03/10/2014   Hypertension    Left bundle branch block 09/01/2020   Obesity    Obesity (BMI 30-39.9) 09/25/2017   OSA (obstructive sleep apnea)    mild with AHI 6.75 - on CPAP   Preop cardiovascular exam 11/2010   Cardiac clearance for knee surgery    Sleep apnea 03/10/2014   Supraventricular tachycardia (HCC)    Documented episode in the past, possibly reentrant tachycardia  Overview:  Overview:  Documented episode in the past, 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 It was possibly reentrant. She does well with diltiazem. No change in therapy.   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 She has never been exposed to tobacco smoke. She has never used smokeless tobacco. She reports that she does  not drink alcohol and does not use drugs. Social History   Socioeconomic History   Marital status: Married    Spouse name: Not on file   Number of children: Not on file   Years of education: Not on file   Highest education level: Not on file  Occupational History   Not on file  Tobacco Use   Smoking status: Former    Passive exposure: Never   Smokeless tobacco: Never  Vaping Use   Vaping status: Never Used  Substance and Sexual Activity   Alcohol use: No   Drug use: No   Sexual  activity: Not Currently  Other Topics Concern   Not on file  Social History Narrative   Not on file   Social Determinants of Health   Financial Resource Strain: Low Risk  (01/16/2021)   Received [facility], [facility]   Overall Financial Resource Strain (CARDIA)    Difficulty of Paying Living Expenses: Not hard at all  Food Insecurity: No Food Insecurity (01/11/2023)   Hunger Vital Sign    Worried About Running Out of Food in the Last Year: Never true    Ran Out of Food in the Last Year: Never true  Transportation Needs: No Transportation Needs (01/11/2023)   PRAPARE - Administrator, Civil Service (Medical): No    Lack of Transportation (Non-Medical): No  Physical Activity: Not on file  Stress: No Stress Concern Present (01/13/2021)   Received from [facility], [facility] - Occupational Stress Questionnaire    Feeling of Stress : Not at all  Social Connections: Unknown (08/28/2021)   Received from [facility], [facility]   Social Network    Social Network: Not on file  Intimate Partner Violence: Not At Risk (01/11/2023)   Humiliation, Afraid, Rape, and Kick questionnaire    Fear of Current or Ex-Partner: No    Emotionally Abused: No    Physically Abused: No    Sexually Abused: No    Functional Status Survey:    Family History  Problem Relation Age of Onset   High blood pressure Mother    Diabetes Mother    Heart Problems Father    Diabetes Father    Prostate cancer Father    Heart attack Father    Heart attack Paternal Grandmother    Heart attack Maternal Grandfather    Heart attack Paternal Uncle    Stroke Neg Hx     Health Maintenance  Topic Date Due   COVID-19 Vaccine (6 - 2023-24 season) 04/24/2023 (Originally 12/25/2022)   Medicare Annual Wellness (AWV)  02/20/2024   DTaP/Tdap/Td (5 - Td or Tdap) 11/25/2030   Pneumonia Vaccine 62+ Years old  Completed   INFLUENZA VACCINE  Completed    DEXA SCAN  Completed   Zoster Vaccines- Shingrix  Completed   HPV VACCINES  Aged Out    Allergies  Allergen Reactions   Keflex [Cephalexin] Diarrhea   Macrobid [Nitrofurantoin] Hives   Amoxicillin Rash   Cefadroxil Rash    Outpatient Encounter Medications as of 03/10/2023  Medication Sig   albuterol (VENTOLIN HFA) 108 (90 Base) MCG/ACT inhaler Inhale 2 puffs into the lungs every 6 (six) hours as needed for wheezing or shortness of breath.   bifidobacterium infantis (ALIGN) capsule Take 1 capsule by mouth daily.   Calcium Carb-Cholecalciferol (CALCIUM PLUS VITAMIN D3 PO) Take 1 tablet by mouth daily. Vitamin D3 + Calcium 600   Cholecalciferol (  VITAMIN D3) 50 MCG (2000 UT) TABS Take 2 tablets by mouth daily.   ferrous sulfate 325 (65 FE) MG EC tablet Take 325 mg by mouth as directed. Once A Day on Mon, Thu   furosemide (LASIX) 20 MG tablet Take 20 mg by mouth daily.   guaiFENesin 200 MG/10ML LIQD Take 10 mLs by mouth every 4 (four) hours as needed.   ipratropium-albuterol (DUONEB) 0.5-2.5 (3) MG/3ML SOLN Inhale 3 mLs into the lungs every 8 (eight) hours as needed (wheezing).   levothyroxine (SYNTHROID) 25 MCG tablet Take 25 mcg by mouth as directed. Take 1 tablet (25 mcg) along with 75 mcg=100 mcg   levothyroxine (SYNTHROID) 75 MCG tablet Take 75 mcg by mouth as directed. Take 1 tablet (75 mcg) along with 25 mcg=100 mcg   loperamide (IMODIUM) 2 MG capsule Take 2 mg by mouth every 4 (four) hours as needed for diarrhea or loose stools.   nystatin (MYCOSTATIN/NYSTOP) powder Apply 1 application  topically in the morning, at noon, in the evening, and at bedtime. To genital areas and inner thigh   nystatin cream (MYCOSTATIN) Apply 1 Application topically at bedtime.   ondansetron (ZOFRAN) 4 MG tablet Take 4 mg by mouth every 8 (eight) hours as needed for nausea or vomiting.   pantoprazole (PROTONIX) 40 MG tablet Take 40 mg by mouth daily.   potassium chloride (MICRO-K) 10 MEQ CR capsule  Take 10 mEq by mouth once.   triamcinolone cream (KENALOG) 0.5 % Apply 1 Application topically at bedtime.   No facility-administered encounter medications on file as of 03/10/2023.    Review of Systems  There were no vitals filed for this visit. There is no height or weight on file to calculate BMI. Physical Exam Constitutional:      Appearance: Normal appearance.  HENT:     Head: Normocephalic and atraumatic.  Cardiovascular:     Rate and Rhythm: Normal rate and regular rhythm.     Heart sounds: No murmur heard. Pulmonary:     Effort: Pulmonary effort is normal. No respiratory distress.     Breath sounds: Normal breath sounds. No wheezing.  Abdominal:     General: Bowel sounds are normal. There is no distension.     Tenderness: There is no abdominal tenderness. There is no guarding or rebound.  Musculoskeletal:        General: No swelling or tenderness.  Neurological:     Mental Status: She is alert. Mental status is at baseline.     Comments: Residual weakness      Labs reviewed: Basic Metabolic Panel: Recent Labs    07/04/22 0502 07/05/22 0538 07/06/22 0702 07/08/22 1714 07/12/22 0437 07/13/22 0412 10/19/22 0339 10/25/22 0000 01/10/23 1938 01/11/23 0500 01/16/23 0000 02/09/23 0000 02/17/23 0000  NA 134* 135 136   < > 133*   < > 134*   < > 135 136 136* 137 139  K 4.2 3.7 3.4*   < > 3.0*   < > 3.7   < > 3.8 3.4* 3.4* 4.4 4.4  CL 104 106 109   < > 102   < > 107   < > 107 108 108 110* 110*  CO2 20* 19* 19*   < > 20*   < > 21*   < > 21* 21* 21 18 20   GLUCOSE 96 94 106*   < > 113*   < > 88  --  105* 87  --   --   --  BUN 25* 24* 20   < > 20   < > 42*   < > 31* 29* 20 37* 41*  CREATININE 1.21* 1.14* 1.01*   < > 1.21*   < > 2.04*   < > 2.10* 1.95* 2.0* 2.0* 2.1*  CALCIUM 8.2* 8.4* 8.4*   < > 7.7*   < > 7.9*   < > 8.7* 8.4* 8.6* 9.0 8.8  MG 2.4 2.4 2.4  --  1.8  --   --   --   --   --   --   --   --   PHOS 3.6 4.3 3.6  --   --   --   --   --   --   --   --   --    --    < > = values in this interval not displayed.   Liver Function Tests: Recent Labs    07/06/22 0702 07/21/22 0000 10/13/22 1855 11/15/22 0000 01/10/23 0000 01/10/23 1938  AST 36   < > 20 19 15 21   ALT 25   < > 17 12 10 17   ALKPHOS 63   < > 85 84 83 87  BILITOT 0.3  --  1.1  --   --  0.6  PROT 6.4*  --  7.0  --   --  7.3  ALBUMIN 2.3*   < > 2.7* 3.3* 3.3* 3.5   < > = values in this interval not displayed.   Recent Labs    01/10/23 1938  LIPASE 24   No results for input(s): "AMMONIA" in the last 8760 hours. CBC: Recent Labs    10/19/22 0339 11/15/22 0000 01/10/23 0000 01/10/23 1938 01/11/23 0500 01/16/23 0000 02/09/23 0000  WBC 11.9*   < > 6.0 6.5 6.1 7.5 8.0  NEUTROABS  --    < > 3,168.00 3.6  --  4,770.00  --   HGB 11.5*   < > 10.6* 11.8* 10.8* 10.7* 11.4*  HCT 37.1   < > 33* 39.4 35.3* 34* 36  MCV 89.6  --   --  89.5 89.1  --   --   PLT 261   < > 244 272 230 225 345   < > = values in this interval not displayed.   Cardiac Enzymes: Recent Labs    07/06/22 0702  CKTOTAL 12*   BNP: Invalid input(s): "POCBNP" No results found for: "HGBA1C" Lab Results  Component Value Date   TSH 2.119 01/10/2023   Lab Results  Component Value Date   VITAMINB12 738 07/04/2022   Lab Results  Component Value Date   FOLATE 11.3 07/04/2022   Lab Results  Component Value Date   IRON 18 (L) 07/09/2022   TIBC 155 (L) 07/09/2022   FERRITIN 772 (H) 07/09/2022    Imaging and Procedures obtained prior to SNF admission: CT ABDOMEN PELVIS WO CONTRAST  Result Date: 01/10/2023 CLINICAL DATA:  Left lower quadrant pain. UTI detected on outside urinalysis but she also has a history of diverticulitis. EXAM: CT ABDOMEN AND PELVIS WITHOUT CONTRAST TECHNIQUE: Multidetector CT imaging of the abdomen and pelvis was performed following the standard protocol without IV contrast. RADIATION DOSE REDUCTION: This exam was performed according to the departmental dose-optimization program  which includes automated exposure control, adjustment of the mA and/or kV according to patient size and/or use of iterative reconstruction technique. COMPARISON:  Limited comparison is available with a CTA chest 07/02/2022 and postmyelogram CT lumbar spine dated 04/06/2020.  There is no prior abdominopelvic dedicated imaging. FINDINGS: Lower chest: Lung bases demonstrate scattered linear scarring or atelectasis without infiltrates. Interval resolution of the small pleural effusions noted on 07/02/2022. A moderate-sized hiatal hernia is again shown. The cardiac size is normal. There are calcifications in the right coronary artery. No pericardial fluid. Hepatobiliary: The liver is 21 cm length and mildly steatotic. No mass is seen without contrast. Gallbladder is absent and there is no biliary dilatation. Pancreas: The gland is moderately atrophic. No masses seen without contrast or ductal dilatation. Spleen: Unremarkable without contrast.  No splenomegaly. Adrenals/Urinary Tract: No adrenal mass. Cortical thinning noted somewhat in both kidneys. Small length measurement of the right kidney of 7.4 cm with low-normal length measurement on the left at 9 cm. There is no contour deforming abnormality of either kidney. There is no urinary stone or obstruction. Small bilateral extrarenal pelves are again shown. There is no thickening of the bladder wall. Stomach/Bowel: Moderate-sized hiatal hernia. No dilatation or wall thickening including the appendix. There is diffuse diverticulosis distal transverse and left-sided colon. There are faint stranding changes along side the distal third of the descending colon which would either represent a mild uncomplicated diverticulitis or fat scarring from prior diverticular disease. A mild diverticulitis is favored in this case. There is no diverticular abscess or free air. Vascular/Lymphatic: Aortic atherosclerosis. No enlarged abdominal or pelvic lymph nodes. Reproductive: Uterus and  bilateral adnexa are unremarkable. Other: No free fluid, free air, free hemorrhage or incarcerated hernia. Small umbilical fat hernia. Musculoskeletal: Old spinous process fusion hardware L3-4 and L4-5 with dorsal decompression at L5-S1 and transitional anatomy with partial lumbarization of S1. Multilevel degenerative discs with vacuum phenomenon. Spondylosis. There is chronic wedging of L1 and 3, chronic and unchanged minimal retrolisthesis at L1-2, minimal anterolisthesis at L3-4, L4-5 and L5-S1, with multilevel acquired foraminal stenosis. Comparison is also made to the right hip CT from 07/11/2022. Redemonstration of removal of hardware from the prior right hip arthroplasty is again noted, similar appearance of fluid and granulation tissue in the right hip bed with again noted cephalad migration of the right femoral shaft into the area. There is healed fracture deformity along the right acetabular roof with acetabular remodeling. All of these findings were present previously and unchanged. No destructive osseous lesion is evident or new abnormality. There is chronic atrophy in the dorsal paraspinous muscles and right iliopsoas and gluteal musculature. IMPRESSION: 1. Diffuse left-sided colonic diverticulosis, with faint stranding changes alongside the distal third of the descending colon which would either represent a mild uncomplicated diverticulitis or fat scarring from prior diverticular disease. A mild diverticulitis is favored in this case. No diverticular abscess or free air. 2. No urinary stone or obstruction. 3. Cortical thinning in both kidneys with small right kidney and low-normal left kidney length. No urinary stone or obstruction. No bladder thickening. 4. Aortic and coronary artery atherosclerosis. 5. Moderate-sized hiatal hernia. 6. Mildly prominent liver with mild steatosis. 7. Small umbilical fat hernia. 8. Chronic changes in the right hip with removal of hardware from the prior right hip  arthroplasty, and right-sided muscular atrophy described above. 9. Degenerative and chronic postsurgical changes lumbar spine with osteopenia and chronic wedging of L1 and 3. Aortic Atherosclerosis (ICD10-I70.0). Electronically Signed   By: Almira Bar M.D.   On: 01/10/2023 21:40    Assessment/Plan There are no diagnoses linked to this encounter.  Itching Flyuconazole Repeat UA AND CULTURE   Family/ staff Communication:   Labs/tests ordered:  .smmsig

## 2023-03-13 ENCOUNTER — Encounter: Payer: Self-pay | Admitting: Sports Medicine

## 2023-03-17 ENCOUNTER — Encounter: Payer: Self-pay | Admitting: Nurse Practitioner

## 2023-03-17 ENCOUNTER — Non-Acute Institutional Stay (SKILLED_NURSING_FACILITY): Payer: Medicare Other | Admitting: Nurse Practitioner

## 2023-03-17 DIAGNOSIS — I10 Essential (primary) hypertension: Secondary | ICD-10-CM | POA: Diagnosis not present

## 2023-03-17 DIAGNOSIS — K2100 Gastro-esophageal reflux disease with esophagitis, without bleeding: Secondary | ICD-10-CM | POA: Diagnosis not present

## 2023-03-17 DIAGNOSIS — N390 Urinary tract infection, site not specified: Secondary | ICD-10-CM | POA: Diagnosis not present

## 2023-03-17 DIAGNOSIS — N184 Chronic kidney disease, stage 4 (severe): Secondary | ICD-10-CM

## 2023-03-17 DIAGNOSIS — I5032 Chronic diastolic (congestive) heart failure: Secondary | ICD-10-CM | POA: Diagnosis not present

## 2023-03-17 NOTE — Assessment & Plan Note (Signed)
stable, on  Pantoprazole, off  Omeprazole, prn Zofran.

## 2023-03-17 NOTE — Progress Notes (Signed)
Location:   SNF [facility] Room Number: 94 Place of Service:  SNF (31) Provider: Arna Snipe Estefani Bateson NP  Heffner, Jimmye Norman, MD  Patient Care Team: Heffner, Jimmye Norman, MD as PCP - General (Family Medicine) [name], MD as PCP - Cardiology (Cardiology) [name], MD as PCP - Sleep Medicine (Cardiology) [name], MD (Sports Medicine) [name], MD (Ophthalmology) [name], [name], MD (Dermatology) [name], MD (Gastroenterology) [name], MD (Nephrology) [name], DC as Referring Physician (Chiropractic Medicine)  Extended Emergency Contact Information Primary Emergency Contact: [facility] Address: [address]          [place], [place] [address] [name] of [place] Home Phone: [phone] Mobile Phone: [phone] Relation: Spouse  Code Status: DNR Goals of care: Advanced Directive information    03/07/2023    4:08 PM  Advanced Directives  Does Patient Have a Medical Advance Directive? Yes  Type of Advance Directive Living will;Out of facility DNR (pink MOST or yellow form)  Does patient want to make changes to medical advance directive? No - Patient declined     Chief Complaint  Patient presents with  . Acute Visit    UTI    HPI:  Pt is a 82 y.o. female seen today for an acute visit for UTI, presentation is itching in urogenital/vaginal area, treated with 1x fluconazole. Repeated urine culture is indicative for UTI, susceptibility showed: Septra DS-hesitant to start give her CKD. Has multiple Abx allergies in the past. Will try Fosfomycin 3gm x1, referral to urology for evaluation and recommendation. Afebrile, no urinary symptoms.   Chronic urogenital area itching, no apparent rash             Hospitalized 01/10/23-01/12/23 for UTI, diverticulitis, treated with Zosyn, Cefadroxil, Flagyl, developed pruritic rash, then ID changed to Cipro and Flagyl-complete 01/19/23, last colonoscopy  2018. Has x1 Fosfomycin.  Hx of UTI, prosthetic joint infection 2022, cellulitis/abscess of the right hip after hardware removed 3/202               Hospitalized for AKI 10/13/22-10/19/22 for AKI, resumed Furosemide, Bun/creat 32/2.11 01/10/23              Hospitalized 07/08/22 for CHF, abscess of the right hip which previous infected, treated, and prosthesis was removed and never replaced.               Hospitalized 07/02/22-07/07/22 for the right hip cellulitis, CT hip in hospital fluid collection unclear if it communicates with the joint space, Ortho not felt to be related to the joint space and may be an old fluid collection, aspiration not recommended.              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 F/u Ortho. TDWB. Power wc. 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 Fully treated, then off antibiotics. 11/17/22 ID no infection, no tx, healed.               Elevated AST/ALT normalized, S/p cholecystectomy, [place] 02/19/21 no cyst or mass.                    Anemia, post op, s/p EGD no active bleed. S/p 6 u PRBC transfusion, ASA was dc'd, On Fe, Iron 15 in hospital, Hgb 11.4 02/09/23             Muscle spasm, left lower back, resolved, off Gabapentin, failed Methocarbamol  HTN  resumed Furosemide, off Spironolactone.              Morbid obesity             OSA CPAP             Chronic diarrhea, prn Imodium, Align             RA f/u Rheumatology, hx of Plaquenil use, stable presently.              Insomnia/depression/anxiety, stable, off Mirtazapine.              GERD, stable, on  Pantoprazole, off  Omeprazole, prn Zofran.              CHF/Edema, dependent  areas, resumed Furosemide, off Spironolactone, BNP 98 11/15/22 CKD stage 4, f/u Nephrology, Bun/creat 41/2.1 10/25/2 Hyponatremia, Na 139 02/17/23             Hypothyroidism, TSH 2.119 01/10/23, on Levothyroxine.      Past Medical History:  Diagnosis Date  . Benign  hypertension with chronic kidney disease, stage III (HCC)    Overview:  Last Assessment & Plan:  Usually the patient is hypertensive. However today her blood pressure is 105 systolic. She has been feeling fatigued with some lightheadedness. Part of this may be related to her lower blood pressure. Her pressure may be improved with her decrease in salt and fluid intake. I've instructed her to put her lisinopril/hctz on hold. I've asked her to see her primary   . Cardiac disease 03/10/2014  . Chronic diarrhea 07/27/2015  . Edema 07/27/2015  . Ejection fraction 2004   Normal, echo,   . Gastroesophageal reflux disease   . GERD (gastroesophageal reflux disease)   . Heart disease 03/10/2014  . Hypertension   . Left bundle branch block 09/01/2020  . Obesity   . Obesity (BMI 30-39.9) 09/25/2017  . OSA (obstructive sleep apnea)    mild with AHI 6.75 - on CPAP  . Preop cardiovascular exam 11/2010   Cardiac clearance for knee surgery   . Sleep apnea 03/10/2014  . 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 It was possibly reentrant. She does well with diltiazem. No change in therapy.  . 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 Thyroid disease   . Urinary incontinence    Past Surgical History:  Procedure Laterality Date  . CATARACT EXTRACTION Left 8/11  . CATARACT EXTRACTION Right 3/12  . CHOLECYSTECTOMY  1994  . Copsilotomy Laser Treatment Left 6/12   eye  . ELBOW SURGERY  8/12  . 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 EYE SURGERY Right 12/11  . Lumbar Infusion  2011  . MOLE REMOVAL  06/17/2015  . 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 TOTAL KNEE ARTHROPLASTY Right  06/13/2011    Allergies  Allergen Reactions  . Keflex [Cephalexin] Diarrhea  . Macrobid [Nitrofurantoin] Hives  . Amoxicillin Rash  . Cefadroxil Rash    Allergies as of 03/17/2023       Reactions   Keflex [cephalexin] Diarrhea   Macrobid [nitrofurantoin] Hives   Amoxicillin Rash   Cefadroxil Rash        Medication List  Accurate as of March 17, 2023 11:59 PM. If you have any questions, ask your nurse or doctor.          albuterol 108 (90 Base) MCG/ACT inhaler Commonly known as: VENTOLIN HFA Inhale 2 puffs into the lungs every 6 (six) hours as needed for wheezing or shortness of breath.   bifidobacterium infantis capsule Take 1 capsule by mouth daily.   CALCIUM PLUS VITAMIN D3 PO Take 1 tablet by mouth daily. Vitamin D3 + Calcium 600   ferrous sulfate 325 (65 FE) MG EC tablet Take 325 mg by mouth as directed. Once A Day on Mon, Thu   furosemide 20 MG tablet Commonly known as: LASIX Take 20 mg by mouth daily.   guaiFENesin 200 MG/10ML Liqd Take 10 mLs by mouth every 4 (four) hours as needed.   ipratropium-albuterol 0.5-2.5 (3) MG/3ML Soln Commonly known as: DUONEB Inhale 3 mLs into the lungs every 8 (eight) hours as needed (wheezing).   levothyroxine 25 MCG tablet Commonly known as: SYNTHROID Take 25 mcg by mouth as directed. Take 1 tablet (25 mcg) along with 75 mcg=100 mcg   levothyroxine 75 MCG tablet Commonly known as: SYNTHROID Take 75 mcg by mouth as directed. Take 1 tablet (75 mcg) along with 25 mcg=100 mcg   loperamide 2 MG capsule Commonly known as: IMODIUM Take 2 mg by mouth every 4 (four) hours as needed for diarrhea or loose stools.   nystatin powder Commonly known as: MYCOSTATIN/NYSTOP Apply 1 application  topically in the morning, at noon, in the evening, and at bedtime. To genital areas and inner thigh   nystatin cream Commonly known as: MYCOSTATIN Apply 1 Application topically at bedtime.   ondansetron 4 MG  tablet Commonly known as: ZOFRAN Take 4 mg by mouth every 8 (eight) hours as needed for nausea or vomiting.   potassium chloride 10 MEQ CR capsule Commonly known as: MICRO-K Take 10 mEq by mouth once.   Protonix 40 MG tablet Generic drug: pantoprazole Take 40 mg by mouth daily.   triamcinolone cream 0.5 % Commonly known as: KENALOG Apply 1 Application topically at bedtime.   Vitamin D3 50 MCG (2000 UT) Tabs Take 2 tablets by mouth daily.        Review of Systems  Constitutional:  Negative for appetite change, fatigue and fever.  HENT:  Negative for congestion, sore throat and trouble swallowing.   Eyes:  Negative for visual disturbance.  Respiratory:  Positive for wheezing. Negative for cough and shortness of breath.   Cardiovascular:  Positive for leg swelling.  Gastrointestinal:  Negative for constipation.  Genitourinary:  Positive for frequency. Negative for dysuria, urgency, vaginal bleeding, vaginal discharge and vaginal pain.       Urogenital area itching.   Musculoskeletal:  Positive for arthralgias, back pain and gait problem.       Mid to lower back pain  Skin:  Negative for color change.  Neurological:  Negative for speech difficulty and weakness.  Psychiatric/Behavioral:  Negative for confusion and sleep disturbance. The patient is not nervous/anxious.     Immunization History  Administered Date(s) Administered  . Influenza Split 01/28/2008, 02/17/2009, 02/20/2012, 02/11/2013, 02/02/2016, 01/11/2017, 02/04/2019  . Influenza, High Dose Seasonal PF 02/04/2015, 01/11/2017, 01/30/2018, 02/05/2020, 01/25/2023  . Influenza-Unspecified 02/16/2022  . Moderna SARS-COV2 Booster Vaccination 02/24/2022  . Moderna Sars-Covid-2 Vaccination 05/09/2019, 05/27/2019, 03/03/2020, 09/04/2020, 11/04/2020  . [employer], [employer] 94yrs & up 02/09/2021  . Pneumococcal Conjugate-13 08/30/2013  . Pneumococcal Polysaccharide-23 11/03/2005  .  RSV,unspecified  04/22/2022  . Td 08/01/2001, 09/05/2019  . Tdap 09/22/2010, 11/24/2020  . Zoster Recombinant(Shingrix) 11/08/2016, 01/11/2017  . Zoster, Live 11/04/2005, 11/08/2016, 01/11/2017  . Zoster, Unspecified 01/11/2017   Pertinent  Health Maintenance Due  Topic Date Due  . INFLUENZA VACCINE  Completed  . DEXA SCAN  Completed      04/15/2022   10:15 AM 04/27/2022   11:35 AM 05/31/2022    9:34 AM 07/26/2022    9:31 AM 10/21/2022    9:31 AM  Fall Risk  Falls in the past year? 0 0 0 0 0  Was there an injury with Fall? 0 0 0 0 0  Fall Risk Category Calculator 0 0 0 0 0  Fall Risk Category (Retired) Low Low     (RETIRED) Patient Fall Risk Level Moderate fall risk Moderate fall risk     Patient at Risk for Falls Due to History of fall(s) History of fall(s) History of fall(s)  No Fall Risks  Fall risk Follow up Falls evaluation completed Falls evaluation completed Falls evaluation completed  Falls evaluation completed   Functional Status Survey:    Vitals:   03/17/23 1334 03/20/23 1259  BP: (!) 157/73 (!) 157/73  Pulse: 61   Resp: 14   Temp: (!) 97.2 F (36.2 C)   SpO2: 97%   Weight: 265 lb (120.2 kg)    Body mass index is 40.29 kg/m. Physical Exam Vitals and nursing note reviewed.  Constitutional:      Appearance: Normal appearance. She is obese.  HENT:     Head: Normocephalic and atraumatic.     Nose: Nose normal.     Mouth/Throat:     Mouth: Mucous membranes are moist.  Eyes:     Extraocular Movements: Extraocular movements intact.     Conjunctiva/sclera: Conjunctivae normal.     Pupils: Pupils are equal, round, and reactive to light.  Cardiovascular:     Rate and Rhythm: Normal rate and regular rhythm.     Heart sounds: No murmur heard. Pulmonary:     Effort: Pulmonary effort is normal.     Breath sounds: Wheezing present. No rales.     Comments: Mild expiratory wheezes  Abdominal:     General: Bowel sounds are normal.     Palpations: Abdomen is soft.     Tenderness:  There is no abdominal tenderness. There is no right CVA tenderness, left CVA tenderness, guarding or rebound.  Musculoskeletal:     Cervical back: Normal range of motion and neck supple.     Right lower leg: Edema present.     Left lower leg: Edema present.     Comments: R hip, prosthetic hip removed. Dependent edema moderate.  Skin:    General: Skin is warm and dry.     Findings: No rash.     Comments: Right hip surgical scar.  Edema mostly in the dependent areas, buttock, thighs, legs.   Neurological:     General: No focal deficit present.     Mental Status: She is alert and oriented to person, place, and time.     Gait: Gait abnormal.  Psychiatric:        Mood and Affect: Mood normal.        Behavior: Behavior normal.        Thought Content: Thought content normal.        Judgment: Judgment normal.    Labs reviewed: Recent Labs    07/04/22 0502 07/05/22 0538 07/06/22 0702 07/08/22 1714  07/12/22 0437 07/13/22 0412 10/19/22 0339 10/25/22 0000 01/10/23 1938 01/11/23 0500 01/16/23 0000 02/09/23 0000 02/17/23 0000  NA 134* 135 136   < > 133*   < > 134*   < > 135 136 136* 137 139  K 4.2 3.7 3.4*   < > 3.0*   < > 3.7   < > 3.8 3.4* 3.4* 4.4 4.4  CL 104 106 109   < > 102   < > 107   < > 107 108 108 110* 110*  CO2 20* 19* 19*   < > 20*   < > 21*   < > 21* 21* 21 18 20   GLUCOSE 96 94 106*   < > 113*   < > 88  --  105* 87  --   --   --   BUN 25* 24* 20   < > 20   < > 42*   < > 31* 29* 20 37* 41*  CREATININE 1.21* 1.14* 1.01*   < > 1.21*   < > 2.04*   < > 2.10* 1.95* 2.0* 2.0* 2.1*  CALCIUM 8.2* 8.4* 8.4*   < > 7.7*   < > 7.9*   < > 8.7* 8.4* 8.6* 9.0 8.8  MG 2.4 2.4 2.4  --  1.8  --   --   --   --   --   --   --   --   PHOS 3.6 4.3 3.6  --   --   --   --   --   --   --   --   --   --    < > = values in this interval not displayed.   Recent Labs    07/06/22 0702 07/21/22 0000 10/13/22 1855 11/15/22 0000 01/10/23 0000 01/10/23 1938  AST 36   < > 20 19 15 21   ALT 25   <  > 17 12 10 17   ALKPHOS 63   < > 85 84 83 87  BILITOT 0.3  --  1.1  --   --  0.6  PROT 6.4*  --  7.0  --   --  7.3  ALBUMIN 2.3*   < > 2.7* 3.3* 3.3* 3.5   < > = values in this interval not displayed.   Recent Labs    10/19/22 0339 11/15/22 0000 01/10/23 0000 01/10/23 1938 01/11/23 0500 01/16/23 0000 02/09/23 0000  WBC 11.9*   < > 6.0 6.5 6.1 7.5 8.0  NEUTROABS  --    < > 3,168.00 3.6  --  4,770.00  --   HGB 11.5*   < > 10.6* 11.8* 10.8* 10.7* 11.4*  HCT 37.1   < > 33* 39.4 35.3* 34* 36  MCV 89.6  --   --  89.5 89.1  --   --   PLT 261   < > 244 272 230 225 345   < > = values in this interval not displayed.   Lab Results  Component Value Date   TSH 2.119 01/10/2023   No results found for: "HGBA1C" No results found for: "CHOL", "HDL", "LDLCALC", "LDLDIRECT", "TRIG", "CHOLHDL"  Significant Diagnostic Results in last 30 days:  No results found.  Assessment/Plan: UTI (urinary tract infection) 03/16/23 urine culture Klebsiella aerogenes>100,000c/ml, Fosfomycin 3gm po x1, Benadryl 25mg  q6h prn x 10 days, Urology eval.   Essential hypertension Intermittent elevated Sbp, resumed Furosemide, off Spironolactone.   GERD (gastroesophageal reflux disease)  stable, on  Pantoprazole,  off  Omeprazole, prn Zofran.  Chronic heart failure with preserved ejection fraction (HFpEF) (HCC) CHF/Edema, dependent  areas, resumed Furosemide, off Spironolactone, BNP 98 11/15/22 The patient has wheezes before going to sleep and awaking in am, no PND or wheezing out of bed, ? Abd obese is contributory, will try DuoNeb 9pm and 8 am, may consider increase Furosemide if no better.   CKD (chronic kidney disease) stage 4, GFR 15-29 ml/min [facility]) f/u Nephrology, Bun/creat 41/2.1 02/17/23 03/14/23 [facility], no new orders.     Family/ staff Communication: plan of care reviewed with the patient and charge nurse.   Labs/tests ordered:  none  Time spend 30 minutes.

## 2023-03-17 NOTE — Assessment & Plan Note (Signed)
03/16/23 urine culture Klebsiella aerogenes>100,000c/ml, Fosfomycin 3gm po x1, Benadryl 25mg  q6h prn x 10 days, Urology eval.

## 2023-03-17 NOTE — Assessment & Plan Note (Signed)
CHF/Edema, dependent  areas, resumed Furosemide, off Spironolactone, BNP 98 11/15/22 The patient has wheezes before going to sleep and awaking in am, no PND or wheezing out of bed, ? Abd obese is contributory, will try DuoNeb 9pm and 8 am, may consider increase Furosemide if no better.

## 2023-03-17 NOTE — Assessment & Plan Note (Addendum)
f/u Nephrology, Bun/creat 41/2.1 02/17/23 03/14/23 [facility], no new orders.

## 2023-03-17 NOTE — Assessment & Plan Note (Signed)
Intermittent elevated Sbp, resumed Furosemide, off Spironolactone.

## 2023-03-20 ENCOUNTER — Encounter: Payer: Self-pay | Admitting: Nurse Practitioner

## 2023-03-20 NOTE — Progress Notes (Signed)
This encounter was created in error - please disregard.

## 2023-03-28 ENCOUNTER — Encounter: Payer: Self-pay | Admitting: Nurse Practitioner

## 2023-03-28 DIAGNOSIS — J302 Other seasonal allergic rhinitis: Secondary | ICD-10-CM | POA: Insufficient documentation

## 2023-04-03 ENCOUNTER — Encounter: Payer: Self-pay | Admitting: Sports Medicine

## 2023-04-03 ENCOUNTER — Non-Acute Institutional Stay (SKILLED_NURSING_FACILITY): Payer: Medicare Other | Admitting: Sports Medicine

## 2023-04-03 DIAGNOSIS — N390 Urinary tract infection, site not specified: Secondary | ICD-10-CM

## 2023-04-03 DIAGNOSIS — R0602 Shortness of breath: Secondary | ICD-10-CM

## 2023-04-03 DIAGNOSIS — E039 Hypothyroidism, unspecified: Secondary | ICD-10-CM

## 2023-04-03 DIAGNOSIS — K219 Gastro-esophageal reflux disease without esophagitis: Secondary | ICD-10-CM

## 2023-04-03 DIAGNOSIS — K5900 Constipation, unspecified: Secondary | ICD-10-CM

## 2023-04-03 DIAGNOSIS — R053 Chronic cough: Secondary | ICD-10-CM

## 2023-04-03 DIAGNOSIS — I5032 Chronic diastolic (congestive) heart failure: Secondary | ICD-10-CM

## 2023-04-03 NOTE — Progress Notes (Signed)
Provider:   Venita Sheffield MD Location:   [facility]    Place of Service:   Skilled care facility   PCP: Heffner, Jimmye Norman, MD Patient Care Team: Heffner, Jimmye Norman, MD as PCP - General (Family Medicine) [name], MD as PCP - Cardiology (Cardiology) [name], MD as PCP - Sleep Medicine (Cardiology) [name], MD (Sports Medicine) [name], MD (Ophthalmology) [name], [name], MD (Dermatology) [name], MD (Gastroenterology) [name], MD (Nephrology) [name], DC as Referring Physician (Chiropractic Medicine)  Extended Emergency Contact Information Primary Emergency Contact: [facility] Address: [address]          [place], [place] [address] [name] of [place] Home Phone: [phone] Mobile Phone: [phone] Relation: Spouse  Code Status:  Goals of Care: Advanced Directive information    03/07/2023    4:08 PM  Advanced Directives  Does Patient Have a Medical Advance Directive? Yes  Type of Advance Directive Living will;Out of facility DNR (pink MOST or yellow form)  Does patient want to make changes to medical advance directive? No - Patient declined      No chief complaint on file.   HPI: Patient is a 82 y.o. female seen today for routine visit for chronic disease management. Pt seen and examined in her room,  she ambulates with the help of power scooter  Cough  x since 2 months  Intermittent dry cough  C/o wheezing and using albuterol nebulizer twice daily  Denies sore throat, sinus pain, ear pain  Denies post nasal drip  No prior h/o asthma or COPD   Constipation  Pt c/o moving bowels every other day  Denies abdominal pain , bloody or dark stools   Rash on her groin  C/o rash under her belly      Past Medical History:  Diagnosis Date   Benign hypertension with chronic kidney disease, stage III (HCC)    Overview:  Last Assessment & Plan:   Usually the patient is hypertensive. However today her blood pressure is 105 systolic. She has been feeling fatigued with some lightheadedness. Part of this may be related to her lower blood pressure. Her pressure may be improved with her decrease in salt and fluid intake. I've instructed her to put her lisinopril/hctz on hold. 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 It was possibly reentrant. She does well with diltiazem. No change in therapy.   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 She has never been exposed to tobacco smoke. She has never used smokeless tobacco.  She reports that she does not drink alcohol and does not use drugs. Social History   Socioeconomic History   Marital status: Married    Spouse name: Not on file   Number of children: Not on file   Years of education: Not on file   Highest education level: Not on file  Occupational History   Not on file  Tobacco Use   Smoking status: Former    Passive exposure: Never   Smokeless tobacco: Never  Vaping Use   Vaping status: Never Used  Substance and Sexual Activity   Alcohol use: No   Drug use: No   Sexual activity: Not Currently  Other Topics Concern   Not on file  Social History Narrative   Not on file   Social Determinants of Health   Financial Resource Strain: Low Risk  (01/16/2021)   Received from [facility], [facility]   Overall Financial Resource Strain (CARDIA)    Difficulty of Paying Living Expenses: Not hard at all  Food Insecurity: No Food Insecurity (01/11/2023)   Hunger Vital Sign    Worried About Running Out of Food in the Last Year: Never true    Ran Out of Food in the Last Year: Never true  Transportation Needs: No Transportation Needs (01/11/2023)   PRAPARE - Administrator, Civil Service (Medical): No    Lack of Transportation (Non-Medical): No  Physical Activity: Not on file  Stress: No Stress Concern Present (01/13/2021)   Received from [facility] - Occupational Stress Questionnaire    Feeling of Stress : Not at all  Social Connections: Unknown (08/28/2021)   Received from [facility], [facility]   Social Network    Social Network: Not on file  Intimate Partner Violence: Not At Risk (01/11/2023)   Humiliation, Afraid, Rape, and Kick questionnaire    Fear of Current or Ex-Partner: No    Emotionally Abused: No    Physically Abused: No    Sexually Abused: No    Functional Status Survey:    Family History  Problem Relation Age of Onset   High blood pressure Mother     Diabetes Mother    Heart Problems Father    Diabetes Father    Prostate cancer Father    Heart attack Father    Heart attack Paternal Grandmother    Heart attack Maternal Grandfather    Heart attack Paternal Uncle    Stroke Neg Hx     Health Maintenance  Topic Date Due   COVID-19 Vaccine (6 - 2023-24 season) 04/24/2023 (Originally 12/25/2022)   Medicare Annual Wellness (AWV)  02/20/2024   DTaP/Tdap/Td (5 - Td or Tdap) 11/25/2030   Pneumonia Vaccine 21+ Years old  Completed   INFLUENZA VACCINE  Completed   DEXA SCAN  Completed   Zoster Vaccines- Shingrix  Completed   HPV VACCINES  Aged Out    Allergies  Allergen Reactions   Keflex [Cephalexin] Diarrhea   Macrobid [Nitrofurantoin] Hives   Amoxicillin Rash   Cefadroxil Rash    Outpatient Encounter Medications as of 04/03/2023  Medication Sig   albuterol (VENTOLIN HFA) 108 (90 Base) MCG/ACT inhaler Inhale 2 puffs into the lungs every 6 (six) hours as needed for wheezing or shortness of breath.   bifidobacterium infantis (ALIGN) capsule Take 1 capsule by mouth daily.   Calcium Carb-Cholecalciferol (CALCIUM PLUS VITAMIN D3 PO) Take 1 tablet by mouth daily. Vitamin D3 + Calcium 600   Cholecalciferol (VITAMIN D3) 50 MCG (2000 UT) TABS Take 2 tablets by mouth daily.   ferrous sulfate 325 (65 FE) MG EC tablet Take 325 mg by mouth as directed. Once A Day on Mon, Thu   furosemide (LASIX) 20 MG tablet Take 20 mg by mouth daily.   guaiFENesin 200 MG/10ML LIQD Take 10 mLs by mouth every 4 (four) hours as needed.   ipratropium-albuterol (DUONEB) 0.5-2.5 (3) MG/3ML SOLN Inhale 3 mLs into the lungs every 8 (eight) hours as needed (wheezing).   levothyroxine (SYNTHROID) 25 MCG tablet Take 25 mcg by mouth as directed. Take 1 tablet (25 mcg) along with 75 mcg=100 mcg   levothyroxine (SYNTHROID) 75 MCG tablet Take 75 mcg by mouth as directed. Take 1 tablet (75 mcg) along with 25 mcg=100 mcg   loperamide (IMODIUM) 2 MG capsule Take 2 mg by  mouth every 4 (four) hours as needed for diarrhea or loose stools.   nystatin (MYCOSTATIN/NYSTOP) powder Apply 1 application  topically in the morning, at noon, in the evening, and at bedtime. To genital areas and inner thigh   nystatin cream (MYCOSTATIN) Apply 1 Application topically at bedtime.   ondansetron (ZOFRAN) 4 MG tablet Take 4 mg by mouth every 8 (eight) hours as needed for nausea or vomiting.   pantoprazole (PROTONIX) 40 MG tablet Take 40 mg by mouth daily.   potassium chloride (MICRO-K) 10 MEQ CR capsule Take 10 mEq by mouth once.   triamcinolone cream (KENALOG) 0.5 % Apply 1 Application topically at bedtime.   No facility-administered encounter medications on file as of 04/03/2023.    Review of Systems  Constitutional:  Negative for chills and fever.  HENT:  Negative for ear pain, postnasal drip, sinus pressure, sinus pain and sore throat.   Respiratory:  Positive for cough, shortness of breath and wheezing.   Cardiovascular:  Negative for chest pain, palpitations and leg swelling.  Gastrointestinal:  Negative for abdominal distention, abdominal pain, blood in stool, constipation, diarrhea, nausea and vomiting.  Genitourinary:  Negative for dysuria, frequency and urgency.  Neurological:  Negative for dizziness, weakness and numbness.  Psychiatric/Behavioral:  Negative for confusion.     There were no vitals filed for this visit. There is no height or weight on file to calculate BMI. Physical Exam Constitutional:      Appearance: Normal appearance.  HENT:     Head: Normocephalic and atraumatic.  Cardiovascular:     Rate and Rhythm: Normal rate and regular rhythm.  Pulmonary:     Effort: Pulmonary effort is normal. No respiratory distress.     Breath sounds: Wheezing present.     Comments: Coarse breath sounds  Wheezing bilaterally  Abdominal:     General: Bowel sounds are normal. There is no distension.     Tenderness: There is no abdominal tenderness. There is no  guarding or rebound.     Comments:  Musculoskeletal:        General: Swelling present. No tenderness.     Comments: Rt lower extremity 1+ pitting oedema    Neurological:     Mental Status: She is alert. Mental status is at baseline.     Sensory: No sensory deficit.     Motor: No weakness.     Labs reviewed: Basic Metabolic Panel: Recent Labs    07/04/22 0502 07/05/22 0538 07/06/22 5643 07/08/22 1714 07/12/22 0437 07/13/22 0412 10/19/22 0339 10/25/22 0000 01/10/23 1938 01/11/23 0500 01/16/23 0000 02/09/23 0000 02/17/23 0000  NA 134* 135 136   < > 133*   < > 134*   < > 135 136 136* 137 139  K 4.2 3.7 3.4*   < > 3.0*   < > 3.7   < > 3.8 3.4* 3.4* 4.4 4.4  CL 104 106 109   < > 102   < > 107   < > 107 108 108 110* 110*  CO2 20* 19* 19*   < > 20*   < > 21*   < > 21* 21* 21 18 20   GLUCOSE 96 94 106*   < > 113*   < > 88  --  105* 87  --   --   --   BUN 25* 24* 20   < > 20   < > 42*   < > 31* 29* 20 37* 41*  CREATININE 1.21* 1.14* 1.01*   < > 1.21*   < > 2.04*   < > 2.10* 1.95* 2.0* 2.0* 2.1*  CALCIUM 8.2* 8.4* 8.4*   < > 7.7*   < > 7.9*   < > 8.7* 8.4* 8.6* 9.0 8.8  MG 2.4 2.4 2.4  --  1.8  --   --   --   --   --   --   --   --   PHOS 3.6 4.3 3.6  --   --   --   --   --   --   --   --   --   --    < > = values in this interval not displayed.   Liver Function Tests: Recent Labs    07/06/22 0702 07/21/22 0000 10/13/22 1855 11/15/22 0000 01/10/23 0000 01/10/23 1938  AST 36   < > 20 19 15 21   ALT 25   < > 17 12 10 17   ALKPHOS 63   < > 85 84 83 87  BILITOT 0.3  --  1.1  --   --  0.6  PROT 6.4*  --  7.0  --   --  7.3  ALBUMIN 2.3*   < > 2.7* 3.3* 3.3* 3.5   < > = values in this interval not displayed.   Recent Labs    01/10/23 1938  LIPASE 24   No results for input(s): "AMMONIA" in the last 8760 hours. CBC: Recent Labs    10/19/22 0339 11/15/22 0000 01/10/23 0000 01/10/23 1938 01/11/23 0500 01/16/23 0000 02/09/23 0000  WBC 11.9*   < > 6.0 6.5 6.1 7.5 8.0   NEUTROABS  --    < > 3,168.00 3.6  --  4,770.00  --   HGB 11.5*   < > 10.6* 11.8* 10.8* 10.7* 11.4*  HCT 37.1   < > 33* 39.4 35.3* 34* 36  MCV 89.6  --   --  89.5 89.1  --   --   PLT 261   < >  244 272 230 225 345   < > = values in this interval not displayed.   Cardiac Enzymes: Recent Labs    07/06/22 0702  CKTOTAL 12*   BNP: Invalid input(s): "POCBNP" No results found for: "HGBA1C" Lab Results  Component Value Date   TSH 2.119 01/10/2023   Lab Results  Component Value Date   VITAMINB12 738 07/04/2022   Lab Results  Component Value Date   FOLATE 11.3 07/04/2022   Lab Results  Component Value Date   IRON 18 (L) 07/09/2022   TIBC 155 (L) 07/09/2022   FERRITIN 772 (H) 07/09/2022    Imaging and Procedures obtained prior to SNF admission: CT ABDOMEN PELVIS WO CONTRAST  Result Date: 01/10/2023 CLINICAL DATA:  Left lower quadrant pain. UTI detected on outside urinalysis but she also has a history of diverticulitis. EXAM: CT ABDOMEN AND PELVIS WITHOUT CONTRAST TECHNIQUE: Multidetector CT imaging of the abdomen and pelvis was performed following the standard protocol without IV contrast. RADIATION DOSE REDUCTION: This exam was performed according to the departmental dose-optimization program which includes automated exposure control, adjustment of the mA and/or kV according to patient size and/or use of iterative reconstruction technique. COMPARISON:  Limited comparison is available with a CTA chest 07/02/2022 and postmyelogram CT lumbar spine dated 04/06/2020. There is no prior abdominopelvic dedicated imaging. FINDINGS: Lower chest: Lung bases demonstrate scattered linear scarring or atelectasis without infiltrates. Interval resolution of the small pleural effusions noted on 07/02/2022. A moderate-sized hiatal hernia is again shown. The cardiac size is normal. There are calcifications in the right coronary artery. No pericardial fluid. Hepatobiliary: The liver is 21 cm length and  mildly steatotic. No mass is seen without contrast. Gallbladder is absent and there is no biliary dilatation. Pancreas: The gland is moderately atrophic. No masses seen without contrast or ductal dilatation. Spleen: Unremarkable without contrast.  No splenomegaly. Adrenals/Urinary Tract: No adrenal mass. Cortical thinning noted somewhat in both kidneys. Small length measurement of the right kidney of 7.4 cm with low-normal length measurement on the left at 9 cm. There is no contour deforming abnormality of either kidney. There is no urinary stone or obstruction. Small bilateral extrarenal pelves are again shown. There is no thickening of the bladder wall. Stomach/Bowel: Moderate-sized hiatal hernia. No dilatation or wall thickening including the appendix. There is diffuse diverticulosis distal transverse and left-sided colon. There are faint stranding changes along side the distal third of the descending colon which would either represent a mild uncomplicated diverticulitis or fat scarring from prior diverticular disease. A mild diverticulitis is favored in this case. There is no diverticular abscess or free air. Vascular/Lymphatic: Aortic atherosclerosis. No enlarged abdominal or pelvic lymph nodes. Reproductive: Uterus and bilateral adnexa are unremarkable. Other: No free fluid, free air, free hemorrhage or incarcerated hernia. Small umbilical fat hernia. Musculoskeletal: Old spinous process fusion hardware L3-4 and L4-5 with dorsal decompression at L5-S1 and transitional anatomy with partial lumbarization of S1. Multilevel degenerative discs with vacuum phenomenon. Spondylosis. There is chronic wedging of L1 and 3, chronic and unchanged minimal retrolisthesis at L1-2, minimal anterolisthesis at L3-4, L4-5 and L5-S1, with multilevel acquired foraminal stenosis. Comparison is also made to the right hip CT from 07/11/2022. Redemonstration of removal of hardware from the prior right hip arthroplasty is again noted,  similar appearance of fluid and granulation tissue in the right hip bed with again noted cephalad migration of the right femoral shaft into the area. There is healed fracture deformity along the right acetabular roof with  acetabular remodeling. All of these findings were present previously and unchanged. No destructive osseous lesion is evident or new abnormality. There is chronic atrophy in the dorsal paraspinous muscles and right iliopsoas and gluteal musculature. IMPRESSION: 1. Diffuse left-sided colonic diverticulosis, with faint stranding changes alongside the distal third of the descending colon which would either represent a mild uncomplicated diverticulitis or fat scarring from prior diverticular disease. A mild diverticulitis is favored in this case. No diverticular abscess or free air. 2. No urinary stone or obstruction. 3. Cortical thinning in both kidneys with small right kidney and low-normal left kidney length. No urinary stone or obstruction. No bladder thickening. 4. Aortic and coronary artery atherosclerosis. 5. Moderate-sized hiatal hernia. 6. Mildly prominent liver with mild steatosis. 7. Small umbilical fat hernia. 8. Chronic changes in the right hip with removal of hardware from the prior right hip arthroplasty, and right-sided muscular atrophy described above. 9. Degenerative and chronic postsurgical changes lumbar spine with osteopenia and chronic wedging of L1 and 3. Aortic Atherosclerosis (ICD10-I70.0). Electronically Signed   By: Almira Bar M.D.   On: 01/10/2023 21:40  COMPREHENSIVE METABOLIC PANEL GLUCOSE 80 mg/dL 16-10 Final  Fasting reference interval UREA NITROGEN (BUN) 33 mg/dL 9-60 H Final CREATININE 2.27 mg/dL 4.54-0.98 H Final EGFR 21 mL/min/1.73 m2 > OR = 60 L Final BUN/CREATININE RATIO 15 (calc) 6-22 Final SODIUM 137 mmol/L 135-146 Final POTASSIUM 4.8 mmol/L 3.5-5.3 Final CHLORIDE 112 mmol/L 98-110 H Final CARBON DIOXIDE 20 mmol/L 20-32 Final CALCIUM 8.8 mg/dL  1.1-91.4 Final PROTEIN, TOTAL 5.9 g/dL 7.8-2.9 L Final ALBUMIN 3.3 g/dL 5.6-2.1 L Final GLOBULIN 2.6 g/dL (calc) 3.0-8.6 Final ALBUMIN/GLOBULIN RATIO 1.3 (calc) 1.0-2.5 Final BILIRUBIN, TOTAL 0.3 mg/dL 5.7-8.4 Final ALKALINE PHOSPHATASE 81 U/L 37-153 Final AST 15 U/L 10-35 Final ALT 12 U/L 6-29 Fina  CBC (INCLUDES DIFF/PLT) WHITE BLOOD CELL COUNT 6.2 Thousand/u L 3.8-10.8 Final RED BLOOD CELL COUNT 4.14 Million/uL 3.80-5.10 Final HEMOGLOBIN 11.2 g/dL 69.6-29.5 L Final HEMATOCRIT 36.1 % 35.0-45.0 Final MCV 87.2 fL 80.0-100.0 Final MCH 27.1 pg 27.0-33.0 Final MCHC 31.0 g/dL 28.4-13.2 L Final  Assessment/Plan   1. Chronic cough Pt c/dry cough since October On exam coarse breath sounds with wheezing  Will order chest  x ray  Will start tessalon   2. SOB (shortness of breath) C/o wheezing  Will start prednisone 40 mg daily x 5 days Pt reports using duo nebs twice daily  No prior h/o asthma  CT from last yr did not show emphysematous changes Will give trial of symbicort  Will refer to pulmonology  Cont with duonebs prn   3. Recurrent UTI Denies urinary symptoms Referral placed to urology   4. Constipation, unspecified constipation type Increase fibre intake Will start colace prn   5. Chronic heart failure with preserved ejection fraction (HFpEF) (HCC) Mils lower extremity swelling    6. Acquired hypothyroidism Cont with synthroid   7. Gastroesophageal reflux disease, unspecified whether esophagitis present Denies heart burn  Cont with protonix    CKD stage 3b  Follows with nephrology  Avoid nephrotoxic meds   Family/ staff Communication:  care plan discussed with the nursing staff  45 Total time spent for obtaining history,  performing a medically appropriate examination and evaluation, reviewing the tests,ordering  tests,  documenting clinical information in the electronic or other health record, independently interpreting results ,care coordination (not  separately reported)    .[email or web address]

## 2023-05-15 ENCOUNTER — Non-Acute Institutional Stay (SKILLED_NURSING_FACILITY): Payer: Medicare Other | Admitting: Nurse Practitioner

## 2023-05-15 ENCOUNTER — Encounter: Payer: Self-pay | Admitting: Nurse Practitioner

## 2023-05-15 DIAGNOSIS — B372 Candidiasis of skin and nail: Secondary | ICD-10-CM

## 2023-05-15 DIAGNOSIS — N184 Chronic kidney disease, stage 4 (severe): Secondary | ICD-10-CM

## 2023-05-15 DIAGNOSIS — I10 Essential (primary) hypertension: Secondary | ICD-10-CM | POA: Diagnosis not present

## 2023-05-15 DIAGNOSIS — D500 Iron deficiency anemia secondary to blood loss (chronic): Secondary | ICD-10-CM | POA: Diagnosis not present

## 2023-05-15 DIAGNOSIS — I5032 Chronic diastolic (congestive) heart failure: Secondary | ICD-10-CM

## 2023-05-15 DIAGNOSIS — K2100 Gastro-esophageal reflux disease with esophagitis, without bleeding: Secondary | ICD-10-CM

## 2023-05-15 DIAGNOSIS — E039 Hypothyroidism, unspecified: Secondary | ICD-10-CM

## 2023-05-15 NOTE — Assessment & Plan Note (Signed)
Blood pressure is controlled, on takes Furosemide.

## 2023-05-15 NOTE — Assessment & Plan Note (Signed)
 stable, on  Pantoprazole, off  Omeprazole, prn Zofran.

## 2023-05-15 NOTE — Assessment & Plan Note (Signed)
TSH 2.119 01/10/23, on Levothyroxine.

## 2023-05-15 NOTE — Assessment & Plan Note (Signed)
 post op, s/p EGD no active bleed. S/p 6 u PRBC transfusion, ASA was dc'd, On Fe, Iron 15 in hospital, Hgb 11.4 02/09/23

## 2023-05-15 NOTE — Assessment & Plan Note (Signed)
f/u Nephrology, Bun/creat 41/2.1 02/17/23

## 2023-05-15 NOTE — Assessment & Plan Note (Signed)
Compensated, , dependent  areas, resumed Furosemide, off Spironolactone, BNP 98 11/15/22

## 2023-05-15 NOTE — Progress Notes (Unsigned)
Location:   SNF [facility] Room Number: 26 Place of Service:  SNF (31) Provider: Arna Snipe Angelia Hazell NP  Heffner, Jimmye Norman, MD  Patient Care Team: Heffner, Jimmye Norman, MD as PCP - General (Family Medicine) [name], MD as PCP - Cardiology (Cardiology) [name], MD as PCP - Sleep Medicine (Cardiology) [name], MD (Sports Medicine) [name], MD (Ophthalmology) [name], [name], MD (Dermatology) [name], MD (Gastroenterology) [name], MD (Nephrology) [name], DC as Referring Physician (Chiropractic Medicine)  Extended Emergency Contact Information Primary Emergency Contact: [facility] Address: [address]          [place], [place] [address] [name] of [place] Home Phone: [phone] Mobile Phone: [phone] Relation: Spouse  Code Status:  DNR Goals of care: Advanced Directive information    03/07/2023    4:08 PM  Advanced Directives  Does Patient Have a Medical Advance Directive? Yes  Type of Advance Directive Living will;Out of facility DNR (pink MOST or yellow form)  Does patient want to make changes to medical advance directive? No - Patient declined     Chief Complaint  Patient presents with   Medical Management of Chronic Issues    HPI:  Pt is a 83 y.o. female seen today for medical management of chronic diseases.    05/12/23 reported open areas in between buttocks, in abd skin folds.   Chronic urogenital area itching, no apparent rash             Hospitalized 01/10/23-01/12/23 for UTI, diverticulitis, treated with Zosyn, Cefadroxil, Flagyl, developed pruritic rash, then ID changed to Cipro and Flagyl-complete 01/19/23, last colonoscopy 2018. Has x1 Fosfomycin.  Hx of UTI, prosthetic joint infection 2022, cellulitis/abscess of the right hip after hardware removed 3/202               Hospitalized for AKI 10/13/22-10/19/22 for AKI, resumed Furosemide, Bun/creat 32/2.11  01/10/23              Hospitalized 07/08/22 for CHF, abscess of the right hip which previous infected, treated, and prosthesis was removed and never replaced.               Hospitalized 07/02/22-07/07/22 for the right hip cellulitis, CT hip in hospital fluid collection unclear if it communicates with the joint space, Ortho not felt to be related to the joint space and may be an old fluid collection, aspiration not recommended.              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 F/u Ortho. TDWB. Power wc. 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 Fully treated, then off antibiotics. 11/17/22 ID no infection, no tx, healed.               Elevated AST/ALT normalized, S/p cholecystectomy, [place] 02/19/21 no cyst or mass.                    Anemia, post op, s/p EGD no active bleed. S/p 6 u PRBC transfusion, ASA was dc'd, On Fe, Iron 15 in hospital, Hgb 11.4 02/09/23             Muscle spasm, left lower back, resolved, off Gabapentin, failed Methocarbamol             HTN  resumed Furosemide, off Spironolactone.              Morbid obesity  OSA CPAP             Chronic diarrhea, prn Imodium, Align             RA f/u Rheumatology, hx of Plaquenil use, stable presently.              Insomnia/depression/anxiety, stable, off Mirtazapine.              GERD, stable, on  Pantoprazole, off  Omeprazole, prn Zofran.              CHF/Edema, dependent  areas, resumed Furosemide, off Spironolactone, BNP 98 11/15/22 CKD stage 4, f/u Nephrology, Bun/creat 41/2.1 02/17/23 Hyponatremia, Na 139 02/17/23             Hypothyroidism, TSH 2.119 01/10/23, on Levothyroxine.       Past Medical History:  Diagnosis Date   Benign hypertension with chronic kidney disease, stage III (HCC)    Overview:  Last Assessment & Plan:  Usually the patient is hypertensive. However today her blood pressure is 105 systolic. She has been feeling fatigued with some lightheadedness. Part  of this may be related to her lower blood pressure. Her pressure may be improved with her decrease in salt and fluid intake. I've instructed her to put her lisinopril/hctz on hold. 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 It was possibly reentrant. She does well with diltiazem. No change in therapy.   SVT (supraventricular tachycardia) (HCC)    Documented episode in the past, possibly reentrant tachycardia   Thyroid disease    Urinary incontinence    Past Surgical History:  Procedure Laterality Date   CATARACT EXTRACTION Left 8/11   CATARACT EXTRACTION Right 3/12   CHOLECYSTECTOMY  1994   Copsilotomy Laser Treatment Left 6/12   eye   ELBOW SURGERY  8/12   EYE SURGERY Left 09/2008   macular hole   EYE SURGERY Right 12/11   Lumbar Infusion  2011   MOLE REMOVAL  06/17/2015   TOTAL KNEE ARTHROPLASTY Left 6/11   TOTAL KNEE ARTHROPLASTY Right 06/13/2011    Allergies  Allergen Reactions   Keflex [Cephalexin] Diarrhea   Macrobid [Nitrofurantoin] Hives   Amoxicillin Rash   Cefadroxil Rash    Allergies as of 05/15/2023       Reactions   Keflex [cephalexin] Diarrhea   Macrobid [nitrofurantoin] Hives    Amoxicillin Rash   Cefadroxil Rash        Medication List        Accurate as of May 15, 2023 11:59 PM. If you have any questions, ask your nurse or doctor.  albuterol 108 (90 Base) MCG/ACT inhaler Commonly known as: VENTOLIN HFA Inhale 2 puffs into the lungs every 6 (six) hours as needed for wheezing or shortness of breath.   bifidobacterium infantis capsule Take 1 capsule by mouth daily.   CALCIUM PLUS VITAMIN D3 PO Take 1 tablet by mouth daily. Vitamin D3 + Calcium 600   ferrous sulfate 325 (65 FE) MG EC tablet Take 325 mg by mouth as directed. Once A Day on Mon, Thu   furosemide 20 MG tablet Commonly known as: LASIX Take 20 mg by mouth daily.   guaiFENesin 200 MG/10ML Liqd Take 10 mLs by mouth every 4 (four) hours as needed.   ipratropium-albuterol 0.5-2.5 (3) MG/3ML Soln Commonly known as: DUONEB Inhale 3 mLs into the lungs every 8 (eight) hours as needed (wheezing).   levothyroxine 25 MCG tablet Commonly known as: SYNTHROID Take 25 mcg by mouth as directed. Take 1 tablet (25 mcg) along with 75 mcg=100 mcg   levothyroxine 75 MCG tablet Commonly known as: SYNTHROID Take 75 mcg by mouth as directed. Take 1 tablet (75 mcg) along with 25 mcg=100 mcg   loperamide 2 MG capsule Commonly known as: IMODIUM Take 2 mg by mouth every 4 (four) hours as needed for diarrhea or loose stools.   nystatin powder Commonly known as: MYCOSTATIN/NYSTOP Apply 1 application  topically in the morning, at noon, in the evening, and at bedtime. To genital areas and inner thigh   nystatin cream Commonly known as: MYCOSTATIN Apply 1 Application topically at bedtime.   ondansetron 4 MG tablet Commonly known as: ZOFRAN Take 4 mg by mouth every 8 (eight) hours as needed for nausea or vomiting.   potassium chloride 10 MEQ CR capsule Commonly known as: MICRO-K Take 10 mEq by mouth once.   Protonix 40 MG tablet Generic drug: pantoprazole Take 40 mg by mouth  daily.   triamcinolone cream 0.5 % Commonly known as: KENALOG Apply 1 Application topically at bedtime.   Vitamin D3 50 MCG (2000 UT) Tabs Take 2 tablets by mouth daily.        Review of Systems  Constitutional:  Negative for appetite change, fatigue and fever.  HENT:  Negative for congestion, sore throat and trouble swallowing.   Eyes:  Negative for visual disturbance.  Respiratory:  Positive for wheezing. Negative for cough and shortness of breath.   Cardiovascular:  Positive for leg swelling.  Gastrointestinal:  Negative for constipation.  Genitourinary:  Positive for frequency. Negative for dysuria, urgency, vaginal bleeding, vaginal discharge and vaginal pain.       Urogenital area itching.   Musculoskeletal:  Positive for arthralgias, back pain and gait problem.       Mid to lower back pain  Skin:  Positive for wound. Negative for color change.  Neurological:  Negative for speech difficulty and weakness.  Psychiatric/Behavioral:  Negative for confusion and sleep disturbance. The patient is not nervous/anxious.     Immunization History  Administered Date(s) Administered   Influenza Split 01/28/2008, 02/17/2009, 02/20/2012, 02/11/2013, 02/02/2016, 01/11/2017, 02/04/2019   Influenza, High Dose Seasonal PF 02/04/2015, 01/11/2017, 01/30/2018, 02/05/2020, 01/25/2023   Influenza-Unspecified 02/16/2022   Moderna SARS-COV2 Booster Vaccination 02/24/2022   Moderna Sars-Covid-2 Vaccination 05/09/2019, 05/27/2019, 03/03/2020, 09/04/2020, 11/04/2020   Pfizer Covid-19 Vaccine Bivalent Booster 50yrs & up 02/09/2021   Pneumococcal Conjugate-13 08/30/2013   Pneumococcal Polysaccharide-23 11/03/2005   RSV,unspecified 04/22/2022   Td 08/01/2001, 09/05/2019   Tdap 09/22/2010, 11/24/2020   Zoster Recombinant(Shingrix) 11/08/2016, 01/11/2017   Zoster, Live  11/04/2005, 11/08/2016, 01/11/2017   Zoster, Unspecified 01/11/2017   Pertinent  Health Maintenance Due  Topic Date Due    INFLUENZA VACCINE  Completed   DEXA SCAN  Completed      04/15/2022   10:15 AM 04/27/2022   11:35 AM 05/31/2022    9:34 AM 07/26/2022    9:31 AM 10/21/2022    9:31 AM  Fall Risk  Falls in the past year? 0 0 0 0 0  Was there an injury with Fall? 0 0 0 0 0  Fall Risk Category Calculator 0 0 0 0 0  Fall Risk Category (Retired) Low Low     (RETIRED) Patient Fall Risk Level Moderate fall risk Moderate fall risk     Patient at Risk for Falls Due to History of fall(s) History of fall(s) History of fall(s)  No Fall Risks  Fall risk Follow up Falls evaluation completed Falls evaluation completed Falls evaluation completed  Falls evaluation completed   Functional Status Survey:    Vitals:   05/15/23 1208  BP: 138/78  Pulse: 66  Resp: 17  Temp: (!) 97.4 F (36.3 C)  SpO2: 97%  Weight: 278 lb 3.2 oz (126.2 kg)   Body mass index is 42.3 kg/m. Physical Exam Vitals and nursing note reviewed.  Constitutional:      Appearance: Normal appearance. She is obese.  HENT:     Head: Normocephalic and atraumatic.     Nose: Nose normal.     Mouth/Throat:     Mouth: Mucous membranes are moist.  Eyes:     Extraocular Movements: Extraocular movements intact.     Conjunctiva/sclera: Conjunctivae normal.     Pupils: Pupils are equal, round, and reactive to light.  Cardiovascular:     Rate and Rhythm: Normal rate and regular rhythm.     Heart sounds: No murmur heard. Pulmonary:     Effort: Pulmonary effort is normal.     Breath sounds: Wheezing present. No rales.     Comments: Mild expiratory wheezes  Abdominal:     General: Bowel sounds are normal.     Palpations: Abdomen is soft.     Tenderness: There is no abdominal tenderness. There is no right CVA tenderness, left CVA tenderness, guarding or rebound.  Musculoskeletal:     Cervical back: Normal range of motion and neck supple.     Right lower leg: Edema present.     Left lower leg: Edema present.     Comments: R hip, prosthetic hip  removed. Dependent edema moderate.  Skin:    General: Skin is warm and dry.     Findings: Rash present.     Comments: Right hip surgical scar.  Edema mostly in the dependent areas, buttock, thighs, legs.  Reddened areas in between buttocks, abd skin folds.   Neurological:     General: No focal deficit present.     Mental Status: She is alert and oriented to person, place, and time.     Gait: Gait abnormal.  Psychiatric:        Mood and Affect: Mood normal.        Behavior: Behavior normal.        Thought Content: Thought content normal.        Judgment: Judgment normal.     Labs reviewed: Recent Labs    07/04/22 0502 07/05/22 0538 07/06/22 0702 07/08/22 1714 07/12/22 0437 07/13/22 0412 10/19/22 0339 10/25/22 0000 01/10/23 1938 01/11/23 0500 01/16/23 0000 02/09/23 0000 02/17/23 0000  NA  134* 135 136   < > 133*   < > 134*   < > 135 136 136* 137 139  K 4.2 3.7 3.4*   < > 3.0*   < > 3.7   < > 3.8 3.4* 3.4* 4.4 4.4  CL 104 106 109   < > 102   < > 107   < > 107 108 108 110* 110*  CO2 20* 19* 19*   < > 20*   < > 21*   < > 21* 21* 21 18 20   GLUCOSE 96 94 106*   < > 113*   < > 88  --  105* 87  --   --   --   BUN 25* 24* 20   < > 20   < > 42*   < > 31* 29* 20 37* 41*  CREATININE 1.21* 1.14* 1.01*   < > 1.21*   < > 2.04*   < > 2.10* 1.95* 2.0* 2.0* 2.1*  CALCIUM 8.2* 8.4* 8.4*   < > 7.7*   < > 7.9*   < > 8.7* 8.4* 8.6* 9.0 8.8  MG 2.4 2.4 2.4  --  1.8  --   --   --   --   --   --   --   --   PHOS 3.6 4.3 3.6  --   --   --   --   --   --   --   --   --   --    < > = values in this interval not displayed.   Recent Labs    07/06/22 0702 07/21/22 0000 10/13/22 1855 11/15/22 0000 01/10/23 0000 01/10/23 1938  AST 36   < > 20 19 15 21   ALT 25   < > 17 12 10 17   ALKPHOS 63   < > 85 84 83 87  BILITOT 0.3  --  1.1  --   --  0.6  PROT 6.4*  --  7.0  --   --  7.3  ALBUMIN 2.3*   < > 2.7* 3.3* 3.3* 3.5   < > = values in this interval not displayed.   Recent Labs     10/19/22 0339 11/15/22 0000 01/10/23 0000 01/10/23 1938 01/11/23 0500 01/16/23 0000 02/09/23 0000  WBC 11.9*   < > 6.0 6.5 6.1 7.5 8.0  NEUTROABS  --    < > 3,168.00 3.6  --  4,770.00  --   HGB 11.5*   < > 10.6* 11.8* 10.8* 10.7* 11.4*  HCT 37.1   < > 33* 39.4 35.3* 34* 36  MCV 89.6  --   --  89.5 89.1  --   --   PLT 261   < > 244 272 230 225 345   < > = values in this interval not displayed.   Lab Results  Component Value Date   TSH 2.119 01/10/2023   No results found for: "HGBA1C" No results found for: "CHOL", "HDL", "LDLCALC", "LDLDIRECT", "TRIG", "CHOLHDL"  Significant Diagnostic Results in last 30 days:  No results found.  Assessment/Plan  Candidiasis of skin 05/12/23 reported open areas in between buttocks, in abd skin folds. Fluconazole 150mg  po x1 Apply Nystatin cream bid to in between buttocks, abd skin folds reddened areas x 2 weeks.   Blood loss anemia post op, s/p EGD no active bleed. S/p 6 u PRBC transfusion, ASA was dc'd, On Fe, Iron 15 in hospital, Hgb 11.4 02/09/23  Essential hypertension Blood pressure is controlled, on takes Furosemide.   GERD (gastroesophageal reflux disease) stable, on  Pantoprazole, off  Omeprazole, prn Zofran.   Chronic heart failure with preserved ejection fraction (HFpEF) (HCC) Compensated, , dependent  areas, resumed Furosemide, off Spironolactone, BNP 98 11/15/22  CKD (chronic kidney disease) stage 4, GFR 15-29 ml/min (HCC) f/u Nephrology, Bun/creat 41/2.1 02/17/23  Acquired hypothyroidism TSH 2.119 01/10/23, on Levothyroxine.    Family/ staff Communication: plan of care reviewed with the patient and charge nurse.   Labs/tests ordered: none  Time spend 30 minutes.

## 2023-05-15 NOTE — Assessment & Plan Note (Signed)
05/12/23 reported open areas in between buttocks, in abd skin folds. Fluconazole 150mg  po x1 Apply Nystatin cream bid to in between buttocks, abd skin folds reddened areas x 2 weeks.

## 2023-06-01 ENCOUNTER — Encounter (HOSPITAL_BASED_OUTPATIENT_CLINIC_OR_DEPARTMENT_OTHER): Payer: Self-pay | Admitting: Pulmonary Disease

## 2023-06-01 ENCOUNTER — Ambulatory Visit (HOSPITAL_BASED_OUTPATIENT_CLINIC_OR_DEPARTMENT_OTHER): Payer: PPO | Admitting: Pulmonary Disease

## 2023-06-01 VITALS — BP 126/84 | HR 71 | Ht 68.0 in | Wt 267.0 lb

## 2023-06-01 DIAGNOSIS — R0602 Shortness of breath: Secondary | ICD-10-CM

## 2023-06-01 DIAGNOSIS — K449 Diaphragmatic hernia without obstruction or gangrene: Secondary | ICD-10-CM | POA: Diagnosis not present

## 2023-06-01 DIAGNOSIS — R053 Chronic cough: Secondary | ICD-10-CM | POA: Diagnosis not present

## 2023-06-01 DIAGNOSIS — R062 Wheezing: Secondary | ICD-10-CM

## 2023-06-01 NOTE — Progress Notes (Signed)
 Subjective:    Patient ID: Madison [name], female    DOB: Sep 12, 1940, 83 y.o.   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  Since then she is unable to stand, ambulate and uses a power chair to move around .  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  History of Present Illness   Madison [name], a patient with a history of hip infection and removal, congestive heart failure, and recurrent urinary tract infections, presents with a cough, shortness of breath, and wheezing that began in October. The symptoms have improved with the use of a nebulizer and inhalers. The patient's husband reports that she has been using the nebulizer once a day and the inhalers as needed. The patient also reports that she has not been sick since starting the nebulizer and inhalers. The patient's husband expresses concern about the potential for any infection to become serious due to the dormant hip infection. The patient is also dealing with a loose tooth that needs to be pulled, which is a concern due to her history of infection. The patient is currently taking Lasix  for fluid retention.       Significant tests/ events reviewed  CT abdomen/pelvis 12/2022 moderate hyponatremia, scarring in the lung bases, effusions resolved, diffuse left colonic diverticulosis  CTA chest 06/2022 cardiomegaly 3V CAD, small effusions, moderate hiatal hernia  Esophagram 06/2022 >> retrograde flow of barium to upper esophagus  Past  Medical History:  Diagnosis Date   Benign hypertension with chronic kidney disease, stage III (HCC)    Overview:  Last Assessment & Plan:  Usually the patient is hypertensive. However today her blood pressure is 105 systolic. She has been feeling fatigued with some lightheadedness. Part of this may be related to her lower blood pressure. Her pressure may be improved with her decrease in salt and fluid intake. I've instructed her to put her lisinopril/hctz on hold. 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 It was possibly reentrant. She does well with diltiazem . No change in therapy.   SVT (supraventricular tachycardia) (HCC)    Documented episode in the past, possibly reentrant tachycardia  Thyroid  disease    Urinary incontinence    Past Surgical History:  Procedure Laterality Date   CATARACT EXTRACTION Left 8/11   CATARACT EXTRACTION Right 3/12   CHOLECYSTECTOMY  1994   Copsilotomy Laser Treatment Left 6/12   eye   ELBOW SURGERY  8/12   EYE SURGERY Left 09/2008   macular hole   EYE SURGERY Right 12/11   Lumbar Infusion  2011   MOLE REMOVAL  06/17/2015   TOTAL KNEE ARTHROPLASTY Left 6/11   TOTAL KNEE  ARTHROPLASTY Right 06/13/2011    Allergies  Allergen Reactions   Keflex [Cephalexin] Diarrhea   Macrobid [Nitrofurantoin] Hives   Amoxicillin Rash   Cefadroxil  Rash    Social History   Socioeconomic History   Marital status: Married    Spouse name: Not on file   Number of children: Not on file   Years of education: Not on file   Highest education level: Not on file  Occupational History   Not on file  Tobacco Use   Smoking status: Former    Passive exposure: Never   Smokeless tobacco: Never  Vaping Use   Vaping status: Never Used  Substance and Sexual Activity   Alcohol  use: No   Drug use: No   Sexual activity: Not Currently  Other Topics Concern   Not on file  Social History Narrative   Not on file   Social Drivers of Health   Financial Resource Strain: Low Risk  (01/16/2021)   Received from [facility], [facility]   Overall Financial Resource Strain (CARDIA)    Difficulty of Paying Living Expenses: Not hard at all  Food Insecurity: No Food Insecurity (01/11/2023)   Hunger Vital Sign    Worried About Running Out of Food in the Last Year: Never true    Ran Out of Food in the Last Year: Never true  Transportation Needs: No Transportation Needs (01/11/2023)   PRAPARE - Administrator, Civil Service (Medical): No    Lack of Transportation (Non-Medical): No  Physical Activity: Not on file  Stress: No Stress Concern Present (01/13/2021)   Received from [facility], [facility] Occupational [facility] - Occupational Stress Questionnaire    Feeling of Stress : Not at all  Social Connections: Unknown (08/28/2021)   Received from [facility], [facility]   Social Network    Social Network: Not on file  Intimate Partner Violence: Not At Risk (01/11/2023)   Humiliation, Afraid, Rape, and Kick questionnaire    Fear of Current or Ex-Partner: No    Emotionally Abused: No    Physically Abused: No    Sexually Abused: No    Family  History  Problem Relation Age of Onset   High blood pressure Mother    Diabetes Mother    Heart Problems Father    Diabetes Father    Prostate cancer Father    Heart attack Father    Heart attack Paternal Grandmother    Heart attack Maternal Grandfather    Heart attack Paternal Uncle    Stroke Neg Hx       Review of Systems Constitutional: negative for anorexia, fevers and sweats  Eyes: negative for irritation, redness and visual disturbance  Ears, nose, mouth, throat, and face: negative for earaches, epistaxis, nasal congestion and sore throat  Respiratory: negative for cough, dyspnea on exertion, sputum and wheezing  Cardiovascular: negative for chest pain, dyspnea, lower extremity edema, orthopnea, palpitations and syncope  Gastrointestinal:  negative for abdominal pain, constipation, diarrhea, melena, nausea and vomiting  Genitourinary:negative for dysuria, frequency and hematuria  Hematologic/lymphatic: negative for bleeding, easy bruising and lymphadenopathy  Musculoskeletal:negative for arthralgias, muscle weakness and stiff joints  Neurological: negative for coordination problems, gait problems, headaches and weakness  Endocrine: negative for diabetic symptoms including polydipsia, polyuria and weight loss     Objective:   Physical Exam  Gen. Pleasant, obese, in no distress, normal affect ENT - no pallor,icterus, no post nasal drip, class 2 airway Neck: No JVD, no thyromegaly, no carotid bruits Lungs: no use of accessory muscles, no dullness to percussion, decreased without rales or rhonchi  Cardiovascular: Rhythm regular, heart sounds  normal, no murmurs or gallops, 1+ peripheral edema R >> L Abdomen: soft and non-tender, no hepatosplenomegaly, BS normal. Musculoskeletal: No deformities, no cyanosis or clubbing Neuro:  alert, non focal, no tremors       Assessment & Plan:    Assessment and Plan    Chronic Cough and Wheezing Chronic cough and wheezing since  October, post flu shot. Symptoms improved with nebulizer and Symbicort. No history of asthma or smoking. Recent scans show no significant lung pathology. Possible viral bronchitis or reflux-related symptoms. Patient prefers to minimize medication use. Discussed risks of reducing medication, including potential recurrence of symptoms. - Change nebulizer to as needed for shortness of breath or wheezing - Change Symbicort to as needed for shortness of breath or wheezing - Consider reflux management with bed elevation to 30 degrees  Hiatal Hernia with Reflux Hiatal hernia noted on scans with reflux. No current symptoms of heartburn. Reflux could contribute to cough and wheezing. Discussed potential benefits of bed elevation and alternative reflux management strategies. - Elevate head of bed to 30 degrees - Consider reflux management strategies  Congestive Heart Failure Congestive heart failure managed with 20 mg Lasix  daily. No recent fluid buildup on scans. - Continue Lasix  20 mg daily  Recurrent Urinary Tract Infections Recurrent UTIs with antibiotic resistance. Urologist advised against antibiotics unless symptomatic. Discussed risks of antibiotic resistance and benefits of avoiding unnecessary antibiotics. - Monitor for UTI symptoms - Avoid antibiotics unless symptomatic  Chronic Infection Risk Severe infection risk post-hip removal. Requires antibiotic prophylaxis for dental procedures. Discussed risks of severe infection and importance of prophylactic antibiotics. - Administer antibiotics prophylaxis for dental procedures  Follow-up - Follow-up with primary care physician as needed - Contact pulmonologist if symptoms recur - Consider phone consultation for acute issues.

## 2023-06-01 NOTE — Patient Instructions (Signed)
 VISIT SUMMARY:  During today's visit, we discussed your ongoing issues with chronic cough and wheezing, hiatal hernia with reflux, congestive heart failure, recurrent urinary tract infections, and the risk of chronic infection due to your history of hip infection and removal. We reviewed your current medications and made some adjustments to better manage your symptoms and reduce the risk of complications.  YOUR PLAN:  -CHRONIC COUGH AND WHEEZING: You have been experiencing chronic cough and wheezing since October, which has improved with the use of a nebulizer and Symbicort inhaler. These symptoms may be due to viral bronchitis or reflux. We have decided to change the use of your nebulizer and Symbicort to 'as needed' for shortness of breath or wheezing. Additionally, we recommend elevating the head of your bed to 30 degrees to help manage potential reflux-related symptoms.  -HIATAL HERNIA WITH REFLUX: A hiatal hernia with reflux was noted on your scans, which could be contributing to your cough and wheezing. Although you are not currently experiencing heartburn, we recommend elevating the head of your bed to 30 degrees and considering other reflux management strategies to help alleviate your symptoms.  -CONGESTIVE HEART FAILURE: Your congestive heart failure is being managed with 20 mg of Lasix  daily, and recent scans show no fluid buildup. We will continue with the current dosage of Lasix  to manage your condition.    INSTRUCTIONS:  Please follow up with your primary care physician as needed. If your symptoms recur, contact your pulmonologist. For any acute issues, consider a phone consultation.

## 2023-06-08 ENCOUNTER — Non-Acute Institutional Stay (SKILLED_NURSING_FACILITY): Payer: Medicare Other | Admitting: Nurse Practitioner

## 2023-06-08 DIAGNOSIS — M62838 Other muscle spasm: Secondary | ICD-10-CM | POA: Diagnosis not present

## 2023-06-08 DIAGNOSIS — Z89621 Acquired absence of right hip joint: Secondary | ICD-10-CM

## 2023-06-08 DIAGNOSIS — M545 Low back pain, unspecified: Secondary | ICD-10-CM | POA: Diagnosis not present

## 2023-06-08 DIAGNOSIS — K2100 Gastro-esophageal reflux disease with esophagitis, without bleeding: Secondary | ICD-10-CM | POA: Diagnosis not present

## 2023-06-08 DIAGNOSIS — N184 Chronic kidney disease, stage 4 (severe): Secondary | ICD-10-CM

## 2023-06-08 DIAGNOSIS — I10 Essential (primary) hypertension: Secondary | ICD-10-CM | POA: Diagnosis not present

## 2023-06-08 DIAGNOSIS — E039 Hypothyroidism, unspecified: Secondary | ICD-10-CM

## 2023-06-08 DIAGNOSIS — I5032 Chronic diastolic (congestive) heart failure: Secondary | ICD-10-CM

## 2023-06-08 NOTE — Assessment & Plan Note (Signed)
TSH 2.119 01/10/23, on Levothyroxine.

## 2023-06-08 NOTE — Assessment & Plan Note (Signed)
C/o lower back pain for a few days, gradual worsening, kept her awake to 3am last night, localized, palpated spinal spinous process tenderness and tenderness in [name] SIJ. The patient's husband stated the patient has lower back pain when she had UTI, afebrile.  Will obtain X-ray lumbar spine, sacrum [place] R hip region hx of R hip region abscess CBC/diff, CMP/eGFR, UA C/S Tramadol 50mg  q8hr and Prednisone 20mg  every day x 5 days 06/08/23 wbc 8.3, Hgb 12.5, plt 266, neutrophils 70.4% 06/08/23 X-ray lumbar spine, sacrum, coccyx: chronic compression fracture of L1

## 2023-06-08 NOTE — Assessment & Plan Note (Signed)
stable, on  Pantoprazole, off  Omeprazole, prn Zofran.

## 2023-06-08 NOTE — Assessment & Plan Note (Signed)
Hospitalized 07/02/22-07/07/22 for the right hip cellulitis, CT hip in hospital fluid collection unclear if it communicates with the joint space, Ortho not felt to be related to the joint space and may be an old fluid collection, aspiration not recommended.              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 F/u Ortho. TDWB. Power wc. 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 Fully treated, then off antibiotics. 11/17/22 ID no infection, no tx, healed.

## 2023-06-08 NOTE — Assessment & Plan Note (Signed)
Hx of, left lower back, resolved, off Gabapentin, failed Methocarbamol

## 2023-06-08 NOTE — Assessment & Plan Note (Signed)
Blood pressure is controlled,  resumed Furosemide, off Spironolactone.

## 2023-06-08 NOTE — Progress Notes (Unsigned)
Location:   SNF [facility] Room Number: 48 Place of Service:  SNF (31) Provider: Arna Snipe Domonik Levario NP  Venita Sheffield, MD  Patient Care Team: Venita Sheffield, MD as PCP - General (Internal Medicine) [name], MD as PCP - Cardiology (Cardiology) [name], MD as PCP - Sleep Medicine (Cardiology) [name], MD (Sports Medicine) [name], MD (Ophthalmology) [name], [name], MD (Dermatology) [name], MD (Gastroenterology) [name], MD (Nephrology) [name], DC as Referring Physician (Chiropractic Medicine)  Extended Emergency Contact Information Primary Emergency Contact: [facility] Address: [address]          [place], [place] [address] [place] Home Phone: [phone] Mobile Phone: [phone] Relation: Spouse  Code Status: DNR Goals of care: Advanced Directive information    03/07/2023    4:08 PM  Advanced Directives  Does Patient Have a Medical Advance Directive? Yes  Type of Advance Directive Living will;Out of facility DNR (pink MOST or yellow form)  Does patient want to make changes to medical advance directive? No - Patient declined     Chief Complaint  Patient presents with   Acute Visit    Lower back pain    HPI:  Pt is a 83 y.o. female seen today for an acute visit for C/o lower back pain for a few days, gradual worsening, kept her awake to 3am last night, localized, palpated spinal spinous process tenderness and tenderness in [name] SIJ. The patient's husband stated the patient has lower back pain when she had UTI, afebrile.   Hospitalized 01/10/23-01/12/23 for UTI, diverticulitis, treated with Zosyn, Cefadroxil, Flagyl, developed pruritic rash, then ID changed to Cipro and Flagyl-complete 01/19/23, last colonoscopy 2018. Has x1 Fosfomycin.  Hx of UTI, prosthetic joint infection 2022, cellulitis/abscess of the right hip after hardware removed 3/202                Hospitalized for AKI 10/13/22-10/19/22 for AKI, resumed Furosemide, Bun/creat 32/2.11 01/10/23              Hospitalized 07/08/22 for CHF, abscess of the right hip which previous infected, treated, and prosthesis was removed and never replaced.               Hospitalized 07/02/22-07/07/22 for the right hip cellulitis, CT hip in hospital fluid collection unclear if it communicates with the joint space, Ortho not felt to be related to the joint space and may be an old fluid collection, aspiration not recommended.              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 F/u Ortho. TDWB. Power wc. 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 Fully treated, then off antibiotics. 11/17/22 ID no infection, no tx, healed.               Elevated AST/ALT normalized, S/p cholecystectomy, [place] 02/19/21 no cyst or mass.                    Anemia, post op, s/p EGD no active bleed. S/p 6 u PRBC transfusion, ASA was dc'd, On Fe, Iron 15 in hospital, Hgb 11.4 02/09/23<<12.5 06/08/23             Muscle spasm, left lower back, resolved, off Gabapentin, failed Methocarbamol             HTN  resumed Furosemide, off Spironolactone.  Morbid obesity             OSA CPAP             Chronic diarrhea, prn Imodium, Align             RA f/u Rheumatology, hx of Plaquenil use, stable presently.              Insomnia/depression/anxiety, stable, off Mirtazapine.              GERD, stable, on  Pantoprazole, off  Omeprazole, prn Zofran.              CHF/Edema, dependent  areas, resumed Furosemide, off Spironolactone, BNP 98 11/15/22,  06/01/23 pulmonology: prn Symbicort, albuterol, DuoNeb CKD stage 4, f/u Nephrology, Bun/creat 41/2.1 02/17/23 Hyponatremia, Na 139 02/17/23             Hypothyroidism, TSH 2.119 01/10/23, on Levothyroxine.                     Past Medical History:  Diagnosis Date   Benign hypertension with chronic kidney disease, stage III (HCC)    Overview:   Last Assessment & Plan:  Usually the patient is hypertensive. However today her blood pressure is 105 systolic. She has been feeling fatigued with some lightheadedness. Part of this may be related to her lower blood pressure. Her pressure may be improved with her decrease in salt and fluid intake. I've instructed her to put her lisinopril/hctz on hold. 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 It was possibly reentrant. She does well with diltiazem. No change in therapy.   SVT (supraventricular tachycardia) (HCC)    Documented episode in the past, possibly reentrant tachycardia   Thyroid disease    Urinary incontinence    Past Surgical History:  Procedure Laterality Date   CATARACT EXTRACTION Left 8/11   CATARACT EXTRACTION Right 3/12   CHOLECYSTECTOMY  1994   Copsilotomy Laser Treatment Left 6/12   eye   ELBOW SURGERY  8/12   EYE SURGERY Left 09/2008   macular hole   EYE SURGERY Right 12/11   Lumbar Infusion  2011   MOLE REMOVAL  06/17/2015   TOTAL KNEE ARTHROPLASTY Left 6/11   TOTAL KNEE ARTHROPLASTY Right 06/13/2011    Allergies  Allergen Reactions   Keflex [Cephalexin] Diarrhea   Macrobid [Nitrofurantoin]  Hives   Amoxicillin Rash   Cefadroxil Rash    Allergies as of 06/08/2023       Reactions   Keflex [cephalexin] Diarrhea   Macrobid [nitrofurantoin] Hives   Amoxicillin Rash   Cefadroxil Rash  Medication List        Accurate as of June 08, 2023 11:59 PM. If you have any questions, ask your nurse or doctor.          albuterol 108 (90 Base) MCG/ACT inhaler Commonly known as: VENTOLIN HFA Inhale 2 puffs into the lungs every 6 (six) hours as needed for wheezing or shortness of breath.   bifidobacterium infantis capsule Take 1 capsule by mouth daily.   budesonide-formoterol 160-4.5 MCG/ACT inhaler Commonly known as: SYMBICORT Inhale 2 puffs into the lungs daily.   CALCIUM PLUS VITAMIN D3 PO Take 1 tablet by mouth daily. Vitamin D3 + Calcium 600   ferrous sulfate 325 (65 FE) MG EC tablet Take 325 mg by mouth as directed. Once A Day on Mon, Thu   furosemide 20 MG tablet Commonly known as: LASIX Take 20 mg by mouth daily.   guaiFENesin 200 MG/10ML Liqd Take 10 mLs by mouth every 4 (four) hours as needed.   ipratropium-albuterol 0.5-2.5 (3) MG/3ML Soln Commonly known as: DUONEB Inhale 3 mLs into the lungs every 8 (eight) hours as needed (wheezing).   levothyroxine 25 MCG tablet Commonly known as: SYNTHROID Take 25 mcg by mouth as directed. Take 1 tablet (25 mcg) along with 75 mcg=100 mcg   levothyroxine 75 MCG tablet Commonly known as: SYNTHROID Take 75 mcg by mouth as directed. Take 1 tablet (75 mcg) along with 25 mcg=100 mcg   loperamide 2 MG capsule Commonly known as: IMODIUM Take 2 mg by mouth every 4 (four) hours as needed for diarrhea or loose stools.   nystatin powder Commonly known as: MYCOSTATIN/NYSTOP Apply 1 application  topically in the morning, at noon, in the evening, and at bedtime. To genital areas and inner thigh   nystatin cream Commonly known as: MYCOSTATIN Apply 1 Application topically at bedtime.   ondansetron 4 MG  tablet Commonly known as: ZOFRAN Take 4 mg by mouth every 8 (eight) hours as needed for nausea or vomiting.   potassium chloride 10 MEQ CR capsule Commonly known as: MICRO-K Take 10 mEq by mouth once.   potassium chloride 10 MEQ tablet Commonly known as: KLOR-CON M Take 3 tablets by mouth daily.   Protonix 40 MG tablet Generic drug: pantoprazole Take 40 mg by mouth daily.   triamcinolone cream 0.5 % Commonly known as: KENALOG Apply 1 Application topically at bedtime.   Vitamin D3 50 MCG (2000 UT) Tabs Take 2 tablets by mouth daily.        Review of Systems  Constitutional:  Negative for appetite change, fatigue and fever.  HENT:  Negative for congestion, sore throat and trouble swallowing.   Eyes:  Negative for visual disturbance.  Respiratory:  Positive for wheezing. Negative for cough and shortness of breath.   Cardiovascular:  Positive for leg swelling.  Gastrointestinal:  Negative for constipation.  Genitourinary:  Positive for frequency. Negative for dysuria, urgency, vaginal bleeding, vaginal discharge and vaginal pain.       Urogenital area itching.   Musculoskeletal:  Positive for arthralgias, back pain and gait problem.       Mid to lower back pain Lower back pain  Skin:  Negative for color change.  Neurological:  Negative for speech difficulty and weakness.  Psychiatric/Behavioral:  Negative for confusion and sleep disturbance. The patient is not nervous/anxious.     Immunization History  Administered Date(s) Administered   Influenza Split 01/28/2008, 02/17/2009, 02/20/2012, 02/11/2013, 02/02/2016, 01/11/2017, 02/04/2019   Influenza, High Dose Seasonal PF  02/04/2015, 01/11/2017, 01/30/2018, 02/05/2020, 01/25/2023   Influenza-Unspecified 02/16/2022   Moderna SARS-COV2 Booster Vaccination 02/24/2022   Moderna Sars-Covid-2 Vaccination 05/09/2019, 05/27/2019, 03/03/2020, 09/04/2020, 11/04/2020   Pfizer Covid-19 Vaccine Bivalent Booster 63yrs & up 02/09/2021    Pneumococcal Conjugate-13 08/30/2013   Pneumococcal Polysaccharide-23 11/03/2005   RSV,unspecified 04/22/2022   Td 08/01/2001, 09/05/2019   Tdap 09/22/2010, 11/24/2020   Zoster Recombinant(Shingrix) 11/08/2016, 01/11/2017   Zoster, Live 11/04/2005, 11/08/2016, 01/11/2017   Zoster, Unspecified 01/11/2017   Pertinent  Health Maintenance Due  Topic Date Due   INFLUENZA VACCINE  Completed   DEXA SCAN  Completed      04/15/2022   10:15 AM 04/27/2022   11:35 AM 05/31/2022    9:34 AM 07/26/2022    9:31 AM 10/21/2022    9:31 AM  Fall Risk  Falls in the past year? 0 0 0 0 0  Was there an injury with Fall? 0 0 0 0 0  Fall Risk Category Calculator 0 0 0 0 0  Fall Risk Category (Retired) Low Low     (RETIRED) Patient Fall Risk Level Moderate fall risk Moderate fall risk     Patient at Risk for Falls Due to History of fall(s) History of fall(s) History of fall(s)  No Fall Risks  Fall risk Follow up Falls evaluation completed Falls evaluation completed Falls evaluation completed  Falls evaluation completed   Functional Status Survey:    Vitals:   06/08/23 0951  BP: 122/71  Pulse: 71  Resp: 16  Temp: (!) 97.2 F (36.2 C)  SpO2: 97%  Weight: 284 lb (128.8 kg)   Body mass index is 43.18 kg/m. Physical Exam Vitals and nursing note reviewed.  Constitutional:      Appearance: Normal appearance. She is obese.  HENT:     Head: Normocephalic and atraumatic.     Nose: Nose normal.     Mouth/Throat:     Mouth: Mucous membranes are moist.  Eyes:     Extraocular Movements: Extraocular movements intact.     Conjunctiva/sclera: Conjunctivae normal.     Pupils: Pupils are equal, round, and reactive to light.  Cardiovascular:     Rate and Rhythm: Normal rate and regular rhythm.     Heart sounds: No murmur heard. Pulmonary:     Effort: Pulmonary effort is normal.     Breath sounds: No wheezing or rales.  Abdominal:     General: Bowel sounds are normal.     Palpations: Abdomen is soft.      Tenderness: There is no abdominal tenderness. There is no right CVA tenderness, left CVA tenderness, guarding or rebound.  Musculoskeletal:        General: Tenderness present.     Cervical back: Normal range of motion and neck supple.     Right lower leg: Edema present.     Left lower leg: Edema present.     Comments: R hip, prosthetic hip removed. Dependent edema moderate. Lower back pain, lumbar spinal spinous tenderness, tenderness in [name] SIJ  Skin:    General: Skin is warm and dry.     Comments: Right hip surgical scar.  Edema mostly in the dependent areas, buttock, thighs, legs.   Neurological:     General: No focal deficit present.     Mental Status: She is alert and oriented to person, place, and time.     Gait: Gait abnormal.  Psychiatric:        Mood and Affect: Mood normal.  Behavior: Behavior normal.        Thought Content: Thought content normal.        Judgment: Judgment normal.     Labs reviewed: Recent Labs    07/04/22 0502 07/05/22 0538 07/06/22 0702 07/08/22 1714 07/12/22 0437 07/13/22 0412 10/19/22 0339 10/25/22 0000 01/10/23 1938 01/11/23 0500 01/16/23 0000 02/09/23 0000 02/17/23 0000  NA 134* 135 136   < > 133*   < > 134*   < > 135 136 136* 137 139  K 4.2 3.7 3.4*   < > 3.0*   < > 3.7   < > 3.8 3.4* 3.4* 4.4 4.4  CL 104 106 109   < > 102   < > 107   < > 107 108 108 110* 110*  CO2 20* 19* 19*   < > 20*   < > 21*   < > 21* 21* 21 18 20   GLUCOSE 96 94 106*   < > 113*   < > 88  --  105* 87  --   --   --   BUN 25* 24* 20   < > 20   < > 42*   < > 31* 29* 20 37* 41*  CREATININE 1.21* 1.14* 1.01*   < > 1.21*   < > 2.04*   < > 2.10* 1.95* 2.0* 2.0* 2.1*  CALCIUM 8.2* 8.4* 8.4*   < > 7.7*   < > 7.9*   < > 8.7* 8.4* 8.6* 9.0 8.8  MG 2.4 2.4 2.4  --  1.8  --   --   --   --   --   --   --   --   PHOS 3.6 4.3 3.6  --   --   --   --   --   --   --   --   --   --    < > = values in this interval not displayed.   Recent Labs    07/06/22 0702  07/21/22 0000 10/13/22 1855 11/15/22 0000 01/10/23 0000 01/10/23 1938  AST 36   < > 20 19 15 21   ALT 25   < > 17 12 10 17   ALKPHOS 63   < > 85 84 83 87  BILITOT 0.3  --  1.1  --   --  0.6  PROT 6.4*  --  7.0  --   --  7.3  ALBUMIN 2.3*   < > 2.7* 3.3* 3.3* 3.5   < > = values in this interval not displayed.   Recent Labs    10/19/22 0339 11/15/22 0000 01/10/23 0000 01/10/23 1938 01/11/23 0500 01/16/23 0000 02/09/23 0000  WBC 11.9*   < > 6.0 6.5 6.1 7.5 8.0  NEUTROABS  --    < > 3,168.00 3.6  --  4,770.00  --   HGB 11.5*   < > 10.6* 11.8* 10.8* 10.7* 11.4*  HCT 37.1   < > 33* 39.4 35.3* 34* 36  MCV 89.6  --   --  89.5 89.1  --   --   PLT 261   < > 244 272 230 225 345   < > = values in this interval not displayed.   Lab Results  Component Value Date   TSH 2.119 01/10/2023   No results found for: "HGBA1C" No results found for: "CHOL", "HDL", "LDLCALC", "LDLDIRECT", "TRIG", "CHOLHDL"  Significant Diagnostic Results in last 30 days:  No results found.  Assessment/Plan:  Lower back pain C/o lower back pain for a few days, gradual worsening, kept her awake to 3am last night, localized, palpated spinal spinous process tenderness and tenderness in [name] SIJ. The patient's husband stated the patient has lower back pain when she had UTI, afebrile.  Will obtain X-ray lumbar spine, sacrum [place] R hip region hx of R hip region abscess CBC/diff, CMP/eGFR, UA C/S Tramadol 50mg  q8hr and Prednisone 20mg  every day x 5 days 06/08/23 wbc 8.3, Hgb 12.5, plt 266, neutrophils 70.4% 06/08/23 X-ray lumbar spine, sacrum, coccyx: chronic compression fracture of L1   Muscle spasm Hx of, left lower back, resolved, off Gabapentin, failed Methocarbamol  Essential hypertension Blood pressure is controlled,  resumed Furosemide, off Spironolactone.   GERD (gastroesophageal reflux disease) stable, on  Pantoprazole, off  Omeprazole, prn Zofran.   Chronic heart failure with preserved ejection fraction  (HFpEF) (HCC)  CHF/Edema, dependent  areas, resumed Furosemide, off Spironolactone, BNP 98 11/15/22  06/01/23 pulmonology: prn Symbicort, albuterol, DuoNeb  CKD (chronic kidney disease) stage 4, GFR 15-29 ml/min (HCC) f/u Nephrology, Bun/creat 41/2.1 02/17/23  Hypothyroidism TSH 2.119 01/10/23, on Levothyroxine.   Absence of hip joint, right Hospitalized 07/02/22-07/07/22 for the right hip cellulitis, CT hip in hospital fluid collection unclear if it communicates with the joint space, Ortho not felt to be related to the joint space and may be an old fluid collection, aspiration not recommended.              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 F/u Ortho. TDWB. Power wc. 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 Fully treated, then off antibiotics. 11/17/22 ID no infection, no tx, healed.     Family/ staff Communication: plan of care reviewed with the patient, the patient's husband, and charge nurse.   Labs/tests ordered:  CBC/diff, CMP/eGFR, UA C/S. X-ray lumbar spine, sacrum, [place] R hip region.

## 2023-06-08 NOTE — Assessment & Plan Note (Addendum)
CHF/Edema, dependent  areas, resumed Furosemide, off Spironolactone, BNP 98 11/15/22  06/01/23 pulmonology: prn Symbicort, albuterol, DuoNeb

## 2023-06-08 NOTE — Assessment & Plan Note (Signed)
f/u Nephrology, Bun/creat 41/2.1 02/17/23

## 2023-06-09 ENCOUNTER — Encounter: Payer: Self-pay | Admitting: Nurse Practitioner

## 2023-06-12 ENCOUNTER — Non-Acute Institutional Stay (SKILLED_NURSING_FACILITY): Payer: Medicare Other | Admitting: Sports Medicine

## 2023-06-12 ENCOUNTER — Encounter: Payer: Self-pay | Admitting: Sports Medicine

## 2023-06-12 DIAGNOSIS — L02416 Cutaneous abscess of left lower limb: Secondary | ICD-10-CM

## 2023-06-12 NOTE — Progress Notes (Unsigned)
 Provider:  Dr. Venita Sheffield Location:  [facility] Place of Service:   Skilled care , [place]  PCP: Venita Sheffield, MD Patient Care Team: Venita Sheffield, MD as PCP - General (Internal Medicine) [name], MD as PCP - Cardiology (Cardiology) [name], MD as PCP - Sleep Medicine (Cardiology) [name], MD (Sports Medicine) [name], MD (Ophthalmology) [name], [name], MD (Dermatology) [name], MD (Gastroenterology) [name], MD (Nephrology) [name], DC as Referring Physician (Chiropractic Medicine)  Extended Emergency Contact Information Primary Emergency Contact: [facility] Address: [address]          [place], [place] [address] [name] of [place] Home Phone: [phone] Mobile Phone: [phone] Relation: Spouse  Goals of Care: Advanced Directive information    03/07/2023    4:08 PM  Advanced Directives  Does Patient Have a Medical Advance Directive? Yes  Type of Advance Directive Living will;Out of facility DNR (pink MOST or yellow form)  Does patient want to make changes to medical advance directive? No - Patient declined       History of Present Illness      83 yr old F with h/o Rt hip THR, CKD, HTN , RA is evaluated for an acute visit for follow up on x ray and u/s results  Pt seen and examined in her room  Husband available at bed side She is non ambulatory  Ambulates with motorized power scooter Pt was having pain in lower back , last week  X ray, U/S  was done which showed chronic abscess, post infectious scroma Bursitis with fluid accumulation  Pt received 2 doses of tramadol last week Denies pain currently      Past Medical History:  Diagnosis Date   Benign hypertension with chronic kidney disease, stage III (HCC)    Overview:  Last Assessment & Plan:  Usually the patient is hypertensive. However today her blood pressure is 105  systolic. She has been feeling fatigued with some lightheadedness. Part of this may be related to her lower blood pressure. Her pressure may be improved with her decrease in salt and fluid intake. I've instructed her to put her lisinopril/hctz on hold. 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 It was possibly reentrant. She does well with diltiazem. No change in therapy.   SVT (supraventricular tachycardia) (HCC)    Documented episode in the past, possibly reentrant tachycardia   Thyroid disease    Urinary incontinence    Past Surgical History:  Procedure Laterality Date   CATARACT EXTRACTION Left 8/11   CATARACT EXTRACTION Right 3/12   CHOLECYSTECTOMY  1994  Copsilotomy Laser Treatment Left 6/12   eye   ELBOW SURGERY  8/12   EYE SURGERY Left 09/2008   macular hole   EYE SURGERY Right 12/11   Lumbar Infusion  2011   MOLE REMOVAL  06/17/2015   TOTAL KNEE ARTHROPLASTY Left 6/11   TOTAL KNEE ARTHROPLASTY Right 06/13/2011    reports that she has quit smoking. She has never been exposed to tobacco smoke. She has never used smokeless tobacco. She reports that she does not drink alcohol and does not use drugs. Social  History   Socioeconomic History   Marital status: Married    Spouse name: Not on file   Number of children: Not on file   Years of education: Not on file   Highest education level: Not on file  Occupational History   Not on file  Tobacco Use   Smoking status: Former    Passive exposure: Never   Smokeless tobacco: Never  Vaping Use   Vaping status: Never Used  Substance and Sexual Activity   Alcohol use: No   Drug use: No   Sexual activity: Not Currently  Other Topics Concern   Not on file  Social History Narrative   Not on file   Social Drivers of Health   Financial Resource Strain: Low Risk  (01/16/2021)   Received from [employer], [facility]   Overall Financial Resource Strain (CARDIA)    Difficulty of Paying Living Expenses: Not hard at all  Food Insecurity: No Food Insecurity (01/11/2023)   Hunger Vital Sign    Worried About Running Out of Food in the Last Year: Never true    Ran Out of Food in the Last Year: Never true  Transportation Needs: No Transportation Needs (01/11/2023)   PRAPARE - Administrator, Civil Service (Medical): No    Lack of Transportation (Non-Medical): No  Physical Activity: Not on file  Stress: No Stress Concern Present (01/13/2021)   Received from [facility], [facility] - Occupational Stress Questionnaire    Feeling of Stress : Not at all  Social Connections: Unknown (08/28/2021)   Received from [facility], [facility]   Social Network    Social Network: Not on file  Intimate Partner Violence: Not At Risk (01/11/2023)   Humiliation, Afraid, Rape, and Kick questionnaire    Fear of Current or Ex-Partner: No    Emotionally Abused: No    Physically Abused: No    Sexually Abused: No    Functional Status Survey:    Family History  Problem Relation Age of Onset   High blood pressure Mother    Diabetes Mother    Heart Problems Father    Diabetes Father    Prostate  cancer Father    Heart attack Father    Heart attack Paternal Grandmother    Heart attack Maternal Grandfather    Heart attack Paternal Uncle    Stroke Neg Hx     Health Maintenance  Topic Date Due   COVID-19 Vaccine (6 - 2024-25 season) 12/25/2022   Medicare Annual Wellness (AWV)  02/20/2024   DTaP/Tdap/Td (5 - Td or Tdap) 11/25/2030   Pneumonia Vaccine 47+ Years old  Completed   INFLUENZA VACCINE  Completed   DEXA SCAN  Completed   Zoster Vaccines- Shingrix  Completed   HPV VACCINES  Aged Out    Allergies  Allergen Reactions   Keflex [Cephalexin] Diarrhea   Macrobid [Nitrofurantoin] Hives  Amoxicillin Rash   Cefadroxil Rash    Outpatient Encounter Medications as of 06/12/2023  Medication Sig   albuterol (VENTOLIN HFA) 108 (90 Base) MCG/ACT inhaler Inhale 2 puffs into the lungs every 6 (six) hours as needed for wheezing or shortness of breath.   bifidobacterium infantis (ALIGN) capsule Take 1 capsule by mouth daily.   budesonide-formoterol (SYMBICORT) 160-4.5 MCG/ACT inhaler Inhale 2 puffs into the lungs daily.   Calcium Carb-Cholecalciferol (CALCIUM PLUS VITAMIN D3 PO) Take 1 tablet by mouth daily. Vitamin D3 + Calcium 600   Cholecalciferol (VITAMIN D3) 50 MCG (2000 UT) TABS Take 2 tablets by mouth daily.   ferrous sulfate 325 (65 FE) MG EC tablet Take 325 mg by mouth as directed. Once A Day on Mon, Thu   furosemide (LASIX) 20 MG tablet Take 20 mg by mouth daily.   guaiFENesin 200 MG/10ML LIQD Take 10 mLs by mouth every 4 (four) hours as needed.   ipratropium-albuterol (DUONEB) 0.5-2.5 (3) MG/3ML SOLN Inhale 3 mLs into the lungs every 8 (eight) hours as needed (wheezing).   levothyroxine (SYNTHROID) 25 MCG tablet Take 25 mcg by mouth as directed. Take 1 tablet (25 mcg) along with 75 mcg=100 mcg   levothyroxine (SYNTHROID) 75 MCG tablet Take 75 mcg by mouth as directed. Take 1 tablet (75 mcg) along with 25 mcg=100 mcg   loperamide (IMODIUM) 2 MG capsule Take 2 mg by  mouth every 4 (four) hours as needed for diarrhea or loose stools.   nystatin (MYCOSTATIN/NYSTOP) powder Apply 1 application  topically in the morning, at noon, in the evening, and at bedtime. To genital areas and inner thigh   nystatin cream (MYCOSTATIN) Apply 1 Application topically at bedtime.   ondansetron (ZOFRAN) 4 MG tablet Take 4 mg by mouth every 8 (eight) hours as needed for nausea or vomiting.   pantoprazole (PROTONIX) 40 MG tablet Take 40 mg by mouth daily.   potassium chloride (KLOR-CON M) 10 MEQ tablet Take 3 tablets by mouth daily.   potassium chloride (MICRO-K) 10 MEQ CR capsule Take 10 mEq by mouth once.   triamcinolone cream (KENALOG) 0.5 % Apply 1 Application topically at bedtime.   No facility-administered encounter medications on file as of 06/12/2023.    Review of Systems  Constitutional:  Negative for fever.  Respiratory:  Negative for cough, shortness of breath and wheezing.   Cardiovascular:  Negative for chest pain and leg swelling.  Gastrointestinal:  Negative for abdominal distention, abdominal pain, blood in stool, constipation, diarrhea, nausea and vomiting.  Genitourinary:  Negative for dysuria.  Neurological:  Negative for dizziness.   Negative unless indicated in HPI.  There were no vitals filed for this visit. There is no height or weight on file to calculate BMI. BP Readings from Last 3 Encounters:  06/08/23 122/71  06/01/23 126/84  05/15/23 138/78   Wt Readings from Last 3 Encounters:  06/08/23 284 lb (128.8 kg)  06/01/23 267 lb (121.1 kg)  05/15/23 278 lb 3.2 oz (126.2 kg)   Physical Exam Constitutional:      Appearance: Normal appearance.  HENT:     Head: Normocephalic and atraumatic.  Cardiovascular:     Rate and Rhythm: Normal rate and regular rhythm.  Pulmonary:     Effort: Pulmonary effort is normal. No respiratory distress.     Breath sounds: Normal breath sounds. No wheezing.  Abdominal:     General: Bowel sounds are normal.  There is no distension.     Tenderness: There is  no abdominal tenderness. There is no guarding or rebound.     Comments:    Musculoskeletal:     Comments: Rt hip - scar  Indurated skin  No warmth  No redness Rt hip - unable to move Rt hip since surgery  Left hip- no redness, no erythema Rom intact   Neurological:     Mental Status: She is alert. Mental status is at baseline.    X ray lumbar spine   IMPRESSION Moderate degenerative and postsurgical changes of the lumbar spine Chronic compression fracture of L is again seen IMPRESSION Limited study  No acute bone abnormality   CBC (INCLUDES DIFF/PLT)     WHITE BLOOD CELL COUNT 8.3 Thousand/uL 3.8-10.8  Final        RED BLOOD CELL COUNT 4.61 Million/uL 3.80-5.10  Final        HEMOGLOBIN 12.5 g/dL 40.9-81.1  Final        HEMATOCRIT 39.9 % 35.0-45.0  Final        MCV 86.6 fL 80.0-100.0  Final        MCH 27.1 pg 27.0-33.0  Final        MCHC 31.3 g/dL 91.4-78.2 L Final Labs reviewed: Basic Metabolic Panel: Recent Labs    07/04/22 0502 07/05/22 0538 07/06/22 0702 07/08/22 1714 07/12/22 0437 07/13/22 0412 10/19/22 0339 10/25/22 0000 01/10/23 1938 01/11/23 0500 01/16/23 0000 02/09/23 0000 02/17/23 0000  NA 134* 135 136   < > 133*   < > 134*   < > 135 136 136* 137 139  K 4.2 3.7 3.4*   < > 3.0*   < > 3.7   < > 3.8 3.4* 3.4* 4.4 4.4  CL 104 106 109   < > 102   < > 107   < > 107 108 108 110* 110*  CO2 20* 19* 19*   < > 20*   < > 21*   < > 21* 21* 21 18 20   GLUCOSE 96 94 106*   < > 113*   < > 88  --  105* 87  --   --   --   BUN 25* 24* 20   < > 20   < > 42*   < > 31* 29* 20 37* 41*  CREATININE 1.21* 1.14* 1.01*   < > 1.21*   < > 2.04*   < > 2.10* 1.95* 2.0* 2.0* 2.1*  CALCIUM 8.2* 8.4* 8.4*   < > 7.7*   < > 7.9*   < > 8.7* 8.4* 8.6* 9.0 8.8  MG 2.4 2.4 2.4  --  1.8  --   --   --   --   --   --   --   --   PHOS 3.6 4.3 3.6  --   --   --   --   --   --   --   --   --   --    < > = values in this interval  not displayed.   Liver Function Tests: Recent Labs    07/06/22 0702 07/21/22 0000 10/13/22 1855 11/15/22 0000 01/10/23 0000 01/10/23 1938  AST 36   < > 20 19 15 21   ALT 25   < > 17 12 10 17   ALKPHOS 63   < > 85 84 83 87  BILITOT 0.3  --  1.1  --   --  0.6  PROT 6.4*  --  7.0  --   --  7.3  ALBUMIN 2.3*   < > 2.7* 3.3* 3.3* 3.5   < > = values in this interval not displayed.   Recent Labs    01/10/23 1938  LIPASE 24   No results for input(s): "AMMONIA" in the last 8760 hours. CBC: Recent Labs    10/19/22 0339 11/15/22 0000 01/10/23 0000 01/10/23 1938 01/11/23 0500 01/16/23 0000 02/09/23 0000  WBC 11.9*   < > 6.0 6.5 6.1 7.5 8.0  NEUTROABS  --    < > 3,168.00 3.6  --  4,770.00  --   HGB 11.5*   < > 10.6* 11.8* 10.8* 10.7* 11.4*  HCT 37.1   < > 33* 39.4 35.3* 34* 36  MCV 89.6  --   --  89.5 89.1  --   --   PLT 261   < > 244 272 230 225 345   < > = values in this interval not displayed.   Cardiac Enzymes: Recent Labs    07/06/22 0702  CKTOTAL 12*   BNP: Invalid input(s): "POCBNP" No results found for: "HGBA1C" Lab Results  Component Value Date   TSH 2.119 01/10/2023   Lab Results  Component Value Date   VITAMINB12 738 07/04/2022   Lab Results  Component Value Date   FOLATE 11.3 07/04/2022   Lab Results  Component Value Date   IRON 18 (L) 07/09/2022   TIBC 155 (L) 07/09/2022   FERRITIN 772 (H) 07/09/2022    Imaging and Procedures obtained prior to SNF admission: CT ABDOMEN PELVIS WO CONTRAST Result Date: 01/10/2023 CLINICAL DATA:  Left lower quadrant pain. UTI detected on outside urinalysis but she also has a history of diverticulitis. EXAM: CT ABDOMEN AND PELVIS WITHOUT CONTRAST TECHNIQUE: Multidetector CT imaging of the abdomen and pelvis was performed following the standard protocol without IV contrast. RADIATION DOSE REDUCTION: This exam was performed according to the departmental dose-optimization program which includes automated exposure  control, adjustment of the mA and/or kV according to patient size and/or use of iterative reconstruction technique. COMPARISON:  Limited comparison is available with a CTA chest 07/02/2022 and postmyelogram CT lumbar spine dated 04/06/2020. There is no prior abdominopelvic dedicated imaging. FINDINGS: Lower chest: Lung bases demonstrate scattered linear scarring or atelectasis without infiltrates. Interval resolution of the small pleural effusions noted on 07/02/2022. A moderate-sized hiatal hernia is again shown. The cardiac size is normal. There are calcifications in the right coronary artery. No pericardial fluid. Hepatobiliary: The liver is 21 cm length and mildly steatotic. No mass is seen without contrast. Gallbladder is absent and there is no biliary dilatation. Pancreas: The gland is moderately atrophic. No masses seen without contrast or ductal dilatation. Spleen: Unremarkable without contrast.  No splenomegaly. Adrenals/Urinary Tract: No adrenal mass. Cortical thinning noted somewhat in both kidneys. Small length measurement of the right kidney of 7.4 cm with low-normal length measurement on the left at 9 cm. There is no contour deforming abnormality of either kidney. There is no urinary stone or obstruction. Small bilateral extrarenal pelves are again shown. There is no thickening of the bladder wall. Stomach/Bowel: Moderate-sized hiatal hernia. No dilatation or wall thickening including the appendix. There is diffuse diverticulosis distal transverse and left-sided colon. There are faint stranding changes along side the distal third of the descending colon which would either represent a mild uncomplicated diverticulitis or fat scarring from prior diverticular disease. A mild diverticulitis is favored in this case. There is no diverticular abscess or free air. Vascular/Lymphatic: Aortic atherosclerosis. No enlarged abdominal  or pelvic lymph nodes. Reproductive: Uterus and bilateral adnexa are unremarkable.  Other: No free fluid, free air, free hemorrhage or incarcerated hernia. Small umbilical fat hernia. Musculoskeletal: Old spinous process fusion hardware L3-4 and L4-5 with dorsal decompression at L5-S1 and transitional anatomy with partial lumbarization of S1. Multilevel degenerative discs with vacuum phenomenon. Spondylosis. There is chronic wedging of L1 and 3, chronic and unchanged minimal retrolisthesis at L1-2, minimal anterolisthesis at L3-4, L4-5 and L5-S1, with multilevel acquired foraminal stenosis. Comparison is also made to the right hip CT from 07/11/2022. Redemonstration of removal of hardware from the prior right hip arthroplasty is again noted, similar appearance of fluid and granulation tissue in the right hip bed with again noted cephalad migration of the right femoral shaft into the area. There is healed fracture deformity along the right acetabular roof with acetabular remodeling. All of these findings were present previously and unchanged. No destructive osseous lesion is evident or new abnormality. There is chronic atrophy in the dorsal paraspinous muscles and right iliopsoas and gluteal musculature. IMPRESSION: 1. Diffuse left-sided colonic diverticulosis, with faint stranding changes alongside the distal third of the descending colon which would either represent a mild uncomplicated diverticulitis or fat scarring from prior diverticular disease. A mild diverticulitis is favored in this case. No diverticular abscess or free air. 2. No urinary stone or obstruction. 3. Cortical thinning in both kidneys with small right kidney and low-normal left kidney length. No urinary stone or obstruction. No bladder thickening. 4. Aortic and coronary artery atherosclerosis. 5. Moderate-sized hiatal hernia. 6. Mildly prominent liver with mild steatosis. 7. Small umbilical fat hernia. 8. Chronic changes in the right hip with removal of hardware from the prior right hip arthroplasty, and right-sided muscular  atrophy described above. 9. Degenerative and chronic postsurgical changes lumbar spine with osteopenia and chronic wedging of L1 and 3. Aortic Atherosclerosis (ICD10-I70.0). Electronically Signed   By: Almira Bar M.D.   On: 01/10/2023 21:40    Assessment and Plan      1. Abscess of left hip (Primary)   No redness or warmth noted Pt currently  not in pain  U/s showed chronic abscess Cbc - no leukocytosis Will refer to ortho and infectious disease     30 min Total time spent for obtaining history,  performing a medically appropriate examination and evaluation, reviewing the tests,  documenting clinical information in the electronic or other health record,   ,care coordination (not separately reported)

## 2023-06-14 ENCOUNTER — Other Ambulatory Visit: Payer: Self-pay | Admitting: Orthopedic Surgery

## 2023-06-14 DIAGNOSIS — N3000 Acute cystitis without hematuria: Secondary | ICD-10-CM

## 2023-06-14 MED ORDER — DOXYCYCLINE HYCLATE 100 MG PO TABS: 100.0000 mg | ORAL_TABLET | Freq: Two times a day (BID) | ORAL | Status: AC

## 2023-06-14 NOTE — Progress Notes (Signed)
 H/o recurrent UTI within past year (12/2022, 02/2023, 05/2023). Allergies to Cipro, Keflex, Macrobid, Amoxicillin and cefadroxil. 02/14 urine culture > 100,000 cfu/mL klebsiella aerogenes. Will start doxycycline x 7 days. Dr. Jacquenette Shone recommended urology consult. Does not appear to be scheduled. Advised nursing to schedule appointment.

## 2023-06-19 ENCOUNTER — Encounter: Payer: Self-pay | Admitting: Nurse Practitioner

## 2023-06-19 ENCOUNTER — Non-Acute Institutional Stay (SKILLED_NURSING_FACILITY): Payer: Medicare Other | Admitting: Nurse Practitioner

## 2023-06-19 DIAGNOSIS — M545 Low back pain, unspecified: Secondary | ICD-10-CM

## 2023-06-19 DIAGNOSIS — K2100 Gastro-esophageal reflux disease with esophagitis, without bleeding: Secondary | ICD-10-CM | POA: Diagnosis not present

## 2023-06-19 DIAGNOSIS — Z89621 Acquired absence of right hip joint: Secondary | ICD-10-CM | POA: Diagnosis not present

## 2023-06-19 DIAGNOSIS — I5032 Chronic diastolic (congestive) heart failure: Secondary | ICD-10-CM

## 2023-06-19 DIAGNOSIS — N390 Urinary tract infection, site not specified: Secondary | ICD-10-CM

## 2023-06-19 DIAGNOSIS — N184 Chronic kidney disease, stage 4 (severe): Secondary | ICD-10-CM

## 2023-06-19 DIAGNOSIS — E039 Hypothyroidism, unspecified: Secondary | ICD-10-CM

## 2023-06-19 NOTE — Assessment & Plan Note (Signed)
 06/13/23 wbc 8.2, Hgb 10.8, plt 273, neutrophils 67.3,  urine culture Klebsiella aerogenes, S cephalosporins, Genta, Septra, plaxed on Doxy x7 days

## 2023-06-19 NOTE — Assessment & Plan Note (Signed)
 f/u Nephrology, Bun/creat 33/2.247 03/08/23

## 2023-06-19 NOTE — Assessment & Plan Note (Signed)
 controlled, taking Tramadol, X-ray lumbar spine, sacrococcyx 06/08/23 negative fx.

## 2023-06-19 NOTE — Assessment & Plan Note (Addendum)
 Hospitalized 07/02/22-07/07/22 for the right hip cellulitis, CT hip in hospital fluid collection unclear if it communicates with the joint space, Ortho not felt to be related to the joint space and may be an old fluid collection, aspiration not recommended.   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 F/u Ortho. TDWB. Power wc. 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 Fully treated, then off antibiotics. 11/17/22 ID no infection, no tx, healed.

## 2023-06-19 NOTE — Assessment & Plan Note (Signed)
 stable, on  Pantoprazole, off  Omeprazole, prn Zofran.

## 2023-06-19 NOTE — Assessment & Plan Note (Signed)
 dependent  areas, resumed Furosemide, off Spironolactone, BNP 98 11/15/22,  06/01/23 pulmonology: prn Symbicort, albuterol, DuoNeb

## 2023-06-19 NOTE — Progress Notes (Signed)
 Location:   SNF [facility] Room Number: 2 Place of Service:  SNF (31) Provider: Arna Snipe Julieanna Geraci NP  Venita Sheffield, MD  Patient Care Team: Venita Sheffield, MD as PCP - General (Internal Medicine) [name], MD as PCP - Cardiology (Cardiology) [name], MD as PCP - Sleep Medicine (Cardiology) [name], MD (Sports Medicine) [name], MD (Ophthalmology) [name], [name], MD (Dermatology) [name], MD (Gastroenterology) [name], MD (Nephrology) [name], DC as Referring Physician (Chiropractic Medicine)  Extended Emergency Contact Information Primary Emergency Contact: [facility] Address: [address]          [place], [place] [address] [place] Home Phone: [phone] Mobile Phone: [phone] Relation: Spouse  Code Status:  DNR Goals of care: Advanced Directive information    03/07/2023    4:08 PM  Advanced Directives  Does Patient Have a Medical Advance Directive? Yes  Type of Advance Directive Living will;Out of facility DNR (pink MOST or yellow form)  Does patient want to make changes to medical advance directive? No - Patient declined     Chief Complaint  Patient presents with  . Medical Management of Chronic Issues    HPI:  Pt is a 83 y.o. female seen today for medical management of chronic diseases.       lower back pain, controlled, taking Tramadol, X-ray lumbar spine, sacrococcyx 06/08/23 negative fx.  UTI, treated with Doxycycline, no urinary symptoms.               Hospitalized 01/10/23-01/12/23 for UTI, diverticulitis, treated with Zosyn, Cefadroxil, Flagyl, developed pruritic rash, then ID changed to Cipro and Flagyl-complete 01/19/23, last colonoscopy 2018. Has x1 Fosfomycin.  Hx of UTI, prosthetic joint infection 2022, cellulitis/abscess of the right hip after hardware removed 3/202               Hospitalized for AKI 10/13/22-10/19/22 for AKI, resumed  Furosemide, Bun/creat 32/2.11 01/10/23              Hospitalized 07/08/22 for CHF, abscess of the right hip which previous infected, treated, and prosthesis was removed and never replaced.               Hospitalized 07/02/22-07/07/22 for the right hip cellulitis, CT hip in hospital fluid collection unclear if it communicates with the joint space, Ortho not felt to be related to the joint space and may be an old fluid collection, aspiration not recommended.   [place] 06/10/23 [facility], avascular fluid collection, pending Ortho f/u, wbc 8.3 06/08/23             Absent hip, s/p right THR 2018, removal of hardware 12/29/20,  takes Tylenol. F/u Ortho. TDWB. Power wc. 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 Fully treated, then off antibiotics. 11/17/22 ID no infection, no tx, healed.               Elevated AST/ALT normalized, S/p cholecystectomy, [place] 02/19/21 no cyst or mass.                    Anemia, post op, s/p EGD no active bleed. S/p 6 u PRBC transfusion, ASA was dc'd, On Fe, Iron 15 in hospital, Hgb 11.4 02/09/23<<12.5 06/08/23             Muscle spasm, left lower back, resolved, off Gabapentin, failed Methocarbamol             HTN  resumed Furosemide, off  Spironolactone.              Morbid obesity             OSA CPAP             Chronic diarrhea, prn Imodium, Align             RA f/u Rheumatology, hx of Plaquenil use, stable presently.              Insomnia/depression/anxiety, stable, off Mirtazapine.              GERD, stable, on  Pantoprazole, off  Omeprazole, prn Zofran.              CHF/Edema, dependent  areas, resumed Furosemide, off Spironolactone, BNP 98 11/15/22,  06/01/23 pulmonology: prn Symbicort, albuterol, DuoNeb CKD stage 4, f/u Nephrology, Bun/creat 33/2.27 03/08/23 Hyponatremia, Na 139 02/17/23             Hypothyroidism, TSH 2.119 01/10/23, on Levothyroxine.                  Past Medical History:  Diagnosis Date  . Benign hypertension with chronic kidney disease, stage III  (HCC)    Overview:  Last Assessment & Plan:  Usually the patient is hypertensive. However today her blood pressure is 105 systolic. She has been feeling fatigued with some lightheadedness. Part of this may be related to her lower blood pressure. Her pressure may be improved with her decrease in salt and fluid intake. I've instructed her to put her lisinopril/hctz on hold. I've asked her to see her primary   . Cardiac disease 03/10/2014  . Chronic diarrhea 07/27/2015  . Edema 07/27/2015  . Ejection fraction 2004   Normal, echo,   . Gastroesophageal reflux disease   . GERD (gastroesophageal reflux disease)   . Heart disease 03/10/2014  . Hypertension   . Left bundle branch block 09/01/2020  . Obesity   . Obesity (BMI 30-39.9) 09/25/2017  . OSA (obstructive sleep apnea)    mild with AHI 6.75 - on CPAP  . Preop cardiovascular exam 11/2010   Cardiac clearance for knee surgery   . Sleep apnea 03/10/2014  . 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 It was possibly reentrant. She does well with diltiazem. No change in therapy.  . 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 Thyroid disease   . Urinary incontinence    Past Surgical History:  Procedure Laterality Date  . CATARACT EXTRACTION Left 8/11  . CATARACT EXTRACTION Right 3/12  . CHOLECYSTECTOMY  1994  . Copsilotomy Laser Treatment Left 6/12   eye  . ELBOW SURGERY  8/12  . 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 EYE SURGERY Right 12/11  . Lumbar Infusion  2011  . MOLE REMOVAL  06/17/2015  . 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 TOTAL KNEE ARTHROPLASTY Right 06/13/2011    Allergies  Allergen Reactions  .  Ciprofloxacin   . Keflex [Cephalexin] Diarrhea  . Macrobid [Nitrofurantoin] Hives  . Amoxicillin Rash  . Cefadroxil Rash    Allergies as of 06/19/2023       Reactions   Ciprofloxacin    Keflex [cephalexin] Diarrhea  Macrobid [nitrofurantoin] Hives   Amoxicillin Rash   Cefadroxil Rash        Medication List        Accurate as of June 19, 2023 11:20 AM. If you have any questions, ask your nurse or doctor.          albuterol 108 (90 Base) MCG/ACT inhaler Commonly known as: VENTOLIN HFA Inhale 2 puffs into the lungs every 6 (six) hours as needed for wheezing or shortness of breath.   bifidobacterium infantis capsule Take 1 capsule by mouth daily.   budesonide-formoterol 160-4.5 MCG/ACT inhaler Commonly known as: SYMBICORT Inhale 2 puffs into the lungs daily.   CALCIUM PLUS VITAMIN D3 PO Take 1 tablet by mouth daily. Vitamin D3 + Calcium 600   doxycycline 100 MG tablet Commonly known as: VIBRA-TABS Take 1 tablet (100 mg total) by mouth 2 (two) times daily for 7 days.   ferrous sulfate 325 (65 FE) MG EC tablet Take 325 mg by mouth as directed. Once A Day on Mon, Thu   furosemide 20 MG tablet Commonly known as: LASIX Take 20 mg by mouth daily.   guaiFENesin 200 MG/10ML Liqd Take 10 mLs by mouth every 4 (four) hours as needed.   ipratropium-albuterol 0.5-2.5 (3) MG/3ML Soln Commonly known as: DUONEB Inhale 3 mLs into the lungs every 8 (eight) hours as needed (wheezing).   levothyroxine 25 MCG tablet Commonly known as: SYNTHROID Take 25 mcg by mouth as directed. Take 1 tablet (25 mcg) along with 75 mcg=100 mcg   levothyroxine 75 MCG tablet Commonly known as: SYNTHROID Take 75 mcg by mouth as directed. Take 1 tablet (75 mcg) along with 25 mcg=100 mcg   loperamide 2 MG capsule Commonly known as: IMODIUM Take 2 mg by mouth every 4 (four) hours as needed for diarrhea or loose stools.   nystatin powder Commonly known as: MYCOSTATIN/NYSTOP Apply 1  application  topically in the morning, at noon, in the evening, and at bedtime. To genital areas and inner thigh   nystatin cream Commonly known as: MYCOSTATIN Apply 1 Application topically at bedtime.   ondansetron 4 MG tablet Commonly known as: ZOFRAN Take 4 mg by mouth every 8 (eight) hours as needed for nausea or vomiting.   potassium chloride 10 MEQ CR capsule Commonly known as: MICRO-K Take 10 mEq by mouth once.   potassium chloride 10 MEQ tablet Commonly known as: KLOR-CON M Take 3 tablets by mouth daily.   Protonix 40 MG tablet Generic drug: pantoprazole Take 40 mg by mouth daily.   traMADol 50 MG tablet Commonly known as: ULTRAM Take 50 mg by mouth 3 (three) times daily as needed.   triamcinolone cream 0.5 % Commonly known as: KENALOG Apply 1 Application topically at bedtime.   Vitamin D3 50 MCG (2000 UT) Tabs Take 2 tablets by mouth daily.        Review of Systems  Constitutional:  Negative for appetite change, fatigue and fever.  HENT:  Negative for congestion, sore throat and trouble swallowing.   Eyes:  Negative for visual disturbance.  Respiratory:  Negative for cough, shortness of breath and wheezing.   Cardiovascular:  Positive for leg swelling.  Gastrointestinal:  Negative for constipation.  Genitourinary:  Positive for frequency. Negative for dysuria and urgency.  Musculoskeletal:  Positive for arthralgias, back pain and gait problem.       Mid to lower back pain Lower back pain  Skin:  Negative for color change.  Neurological:  Negative for  speech difficulty and weakness.  Psychiatric/Behavioral:  Negative for confusion and sleep disturbance. The patient is not nervous/anxious.     Immunization History  Administered Date(s) Administered  . Influenza Split 01/28/2008, 02/17/2009, 02/20/2012, 02/11/2013, 02/02/2016, 01/11/2017, 02/04/2019  . Influenza, High Dose Seasonal PF 02/04/2015, 01/11/2017, 01/30/2018, 02/05/2020, 01/25/2023  .  Influenza-Unspecified 02/16/2022  . Moderna SARS-COV2 Booster Vaccination 02/24/2022  . Moderna Sars-Covid-2 Vaccination 05/09/2019, 05/27/2019, 03/03/2020, 09/04/2020, 11/04/2020  . [employer] 73yrs & up 02/09/2021  . Pneumococcal Conjugate-13 08/30/2013  . Pneumococcal Polysaccharide-23 11/03/2005  . RSV,unspecified 04/22/2022  . Td 08/01/2001, 09/05/2019  . Tdap 09/22/2010, 11/24/2020  . Zoster Recombinant(Shingrix) 11/08/2016, 01/11/2017  . Zoster, Live 11/04/2005, 11/08/2016, 01/11/2017  . Zoster, Unspecified 01/11/2017   Pertinent  Health Maintenance Due  Topic Date Due  . INFLUENZA VACCINE  Completed  . DEXA SCAN  Completed      04/15/2022   10:15 AM 04/27/2022   11:35 AM 05/31/2022    9:34 AM 07/26/2022    9:31 AM 10/21/2022    9:31 AM  Fall Risk  Falls in the past year? 0 0 0 0 0  Was there an injury with Fall? 0 0 0 0 0  Fall Risk Category Calculator 0 0 0 0 0  Fall Risk Category (Retired) Low Low     (RETIRED) Patient Fall Risk Level Moderate fall risk Moderate fall risk     Patient at Risk for Falls Due to History of fall(s) History of fall(s) History of fall(s)  No Fall Risks  Fall risk Follow up Falls evaluation completed Falls evaluation completed Falls evaluation completed  Falls evaluation completed   Functional Status Survey:    Vitals:   06/19/23 1106  BP: 129/76  Pulse: (!) 59  Resp: 16  Temp: (!) 97.2 F (36.2 C)  SpO2: 97%  Weight: 284 lb (128.8 kg)   Body mass index is 43.18 kg/m. Physical Exam Vitals and nursing note reviewed.  Constitutional:      Appearance: Normal appearance. She is obese.  HENT:     Head: Normocephalic and atraumatic.     Nose: Nose normal.     Mouth/Throat:     Mouth: Mucous membranes are moist.  Eyes:     Extraocular Movements: Extraocular movements intact.     Conjunctiva/sclera: Conjunctivae normal.     Pupils: Pupils are equal, round, and reactive to light.  Cardiovascular:     Rate  and Rhythm: Normal rate and regular rhythm.     Heart sounds: No murmur heard. Pulmonary:     Effort: Pulmonary effort is normal.     Breath sounds: No rales.  Abdominal:     General: Bowel sounds are normal.     Palpations: Abdomen is soft.     Tenderness: There is no abdominal tenderness.  Musculoskeletal:        General: No tenderness.     Cervical back: Normal range of motion and neck supple.     Right lower leg: Edema present.     Left lower leg: Edema present.     Comments: R hip, prosthetic hip removed. Dependent edema moderate. Lower back pain is controlled.   Skin:    General: Skin is warm and dry.     Comments: Right hip surgical scar.  Edema mostly in the dependent areas, buttock, thighs, legs.   Neurological:     General: No focal deficit present.     Mental Status: She is alert and oriented to person, place,  and time.     Gait: Gait abnormal.  Psychiatric:        Mood and Affect: Mood normal.        Behavior: Behavior normal.        Thought Content: Thought content normal.        Judgment: Judgment normal.    Labs reviewed: Recent Labs    07/04/22 0502 07/05/22 0538 07/06/22 0702 07/08/22 1714 07/12/22 0437 07/13/22 0412 10/19/22 0339 10/25/22 0000 01/10/23 1938 01/11/23 0500 01/16/23 0000 02/09/23 0000 02/17/23 0000  NA 134* 135 136   < > 133*   < > 134*   < > 135 136 136* 137 139  K 4.2 3.7 3.4*   < > 3.0*   < > 3.7   < > 3.8 3.4* 3.4* 4.4 4.4  CL 104 106 109   < > 102   < > 107   < > 107 108 108 110* 110*  CO2 20* 19* 19*   < > 20*   < > 21*   < > 21* 21* 21 18 20   GLUCOSE 96 94 106*   < > 113*   < > 88  --  105* 87  --   --   --   BUN 25* 24* 20   < > 20   < > 42*   < > 31* 29* 20 37* 41*  CREATININE 1.21* 1.14* 1.01*   < > 1.21*   < > 2.04*   < > 2.10* 1.95* 2.0* 2.0* 2.1*  CALCIUM 8.2* 8.4* 8.4*   < > 7.7*   < > 7.9*   < > 8.7* 8.4* 8.6* 9.0 8.8  MG 2.4 2.4 2.4  --  1.8  --   --   --   --   --   --   --   --   PHOS 3.6 4.3 3.6  --   --   --    --   --   --   --   --   --   --    < > = values in this interval not displayed.   Recent Labs    07/06/22 0702 07/21/22 0000 10/13/22 1855 11/15/22 0000 01/10/23 0000 01/10/23 1938  AST 36   < > 20 19 15 21   ALT 25   < > 17 12 10 17   ALKPHOS 63   < > 85 84 83 87  BILITOT 0.3  --  1.1  --   --  0.6  PROT 6.4*  --  7.0  --   --  7.3  ALBUMIN 2.3*   < > 2.7* 3.3* 3.3* 3.5   < > = values in this interval not displayed.   Recent Labs    10/19/22 0339 11/15/22 0000 01/10/23 0000 01/10/23 1938 01/11/23 0500 01/16/23 0000 02/09/23 0000  WBC 11.9*   < > 6.0 6.5 6.1 7.5 8.0  NEUTROABS  --    < > 3,168.00 3.6  --  4,770.00  --   HGB 11.5*   < > 10.6* 11.8* 10.8* 10.7* 11.4*  HCT 37.1   < > 33* 39.4 35.3* 34* 36  MCV 89.6  --   --  89.5 89.1  --   --   PLT 261   < > 244 272 230 225 345   < > = values in this interval not displayed.   Lab Results  Component Value Date   TSH 2.119 01/10/2023  No results found for: "HGBA1C" No results found for: "CHOL", "HDL", "LDLCALC", "LDLDIRECT", "TRIG", "CHOLHDL"  Significant Diagnostic Results in last 30 days:  No results found.  Assessment/Plan  Lower back pain controlled, taking Tramadol, X-ray lumbar spine, sacrococcyx 06/08/23 negative fx.   UTI (urinary tract infection) 06/13/23 wbc 8.2, Hgb 10.8, plt 273, neutrophils 67.3,  urine culture Klebsiella aerogenes, S cephalosporins, Genta, Septra, plaxed on Doxy x7 days  Absence of hip joint, right   Hospitalized 07/02/22-07/07/22 for the right hip cellulitis, CT hip in hospital fluid collection unclear if it communicates with the joint space, Ortho not felt to be related to the joint space and may be an old fluid collection, aspiration not recommended.   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 F/u Ortho. TDWB. Power wc. 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 Fully treated, then off antibiotics. 11/17/22 ID no infection, no tx, healed.   GERD (gastroesophageal reflux disease) stable, on  Pantoprazole, off  Omeprazole, prn Zofran.   Chronic heart failure with preserved ejection fraction (HFpEF) (HCC)  dependent  areas, resumed Furosemide, off Spironolactone, BNP 98 11/15/22,  06/01/23 pulmonology: prn Symbicort, albuterol, DuoNeb  CKD (chronic kidney disease) stage 4, GFR 15-29 ml/min [facility])  f/u Nephrology, Bun/creat 33/2.247 03/08/23   Family/ staff Communication: plan of care reviewed with the patient and charge nurse.   Labs/tests ordered:  none

## 2023-06-19 NOTE — Assessment & Plan Note (Signed)
 TSH 2.119 01/10/23, on Levothyroxine.

## 2023-06-21 LAB — LAB REPORT - SCANNED: EGFR: 22

## 2023-06-27 ENCOUNTER — Other Ambulatory Visit: Payer: Self-pay | Admitting: Orthopedic Surgery

## 2023-06-27 DIAGNOSIS — M25551 Pain in right hip: Secondary | ICD-10-CM

## 2023-07-10 ENCOUNTER — Ambulatory Visit
Admission: RE | Admit: 2023-07-10 | Discharge: 2023-07-10 | Disposition: A | Discharge status: Home / Self Care | Source: Ambulatory Visit | Attending: Orthopedic Surgery | Admitting: Orthopedic Surgery

## 2023-07-10 DIAGNOSIS — M25551 Pain in right hip: Secondary | ICD-10-CM

## 2023-07-15 LAB — CELL COUNT + DIFF, W/O CRYST-SYNVL FLD
Basophils, %: 0 %
Eosinophils-Synovial: 0 % (ref 0–2)
Lymphocytes-Synovial Fld: 35 % (ref 0–74)
Monocyte/Macrophage: 17 % (ref 0–69)
Neutrophil, Synovial: 48 % — ABNORMAL HIGH (ref 0–24)
Synoviocytes, %: 0 % (ref 0–15)
WBC, Synovial: 224 {cells}/uL — ABNORMAL HIGH (ref <150)

## 2023-07-15 LAB — EXTRA SPECIMEN

## 2023-07-15 LAB — ANAEROBIC AND AEROBIC CULTURE
AER RESULT:: NO GROWTH
MICRO NUMBER:: 16209177
MICRO NUMBER:: 16209178
SPECIMEN QUALITY:: ADEQUATE
SPECIMEN QUALITY:: ADEQUATE

## 2023-07-17 ENCOUNTER — Encounter: Payer: Self-pay | Admitting: Sports Medicine

## 2023-07-17 ENCOUNTER — Non-Acute Institutional Stay (SKILLED_NURSING_FACILITY): Admitting: Sports Medicine

## 2023-07-17 DIAGNOSIS — Z89621 Acquired absence of right hip joint: Secondary | ICD-10-CM

## 2023-07-17 DIAGNOSIS — E039 Hypothyroidism, unspecified: Secondary | ICD-10-CM | POA: Diagnosis not present

## 2023-07-17 DIAGNOSIS — J449 Chronic obstructive pulmonary disease, unspecified: Secondary | ICD-10-CM

## 2023-07-17 DIAGNOSIS — M545 Low back pain, unspecified: Secondary | ICD-10-CM

## 2023-07-17 DIAGNOSIS — I5032 Chronic diastolic (congestive) heart failure: Secondary | ICD-10-CM | POA: Diagnosis not present

## 2023-07-17 DIAGNOSIS — N390 Urinary tract infection, site not specified: Secondary | ICD-10-CM

## 2023-07-17 DIAGNOSIS — K2100 Gastro-esophageal reflux disease with esophagitis, without bleeding: Secondary | ICD-10-CM

## 2023-07-17 DIAGNOSIS — N1831 Chronic kidney disease, stage 3a: Secondary | ICD-10-CM

## 2023-07-17 NOTE — Progress Notes (Signed)
 Provider:  Dr. Venita Sheffield Location:  [facility] Place of Service:   Skilled care  PCP: Venita Sheffield, MD Patient Care Team: Venita Sheffield, MD as PCP - General (Internal Medicine) [name], MD as PCP - Cardiology (Cardiology) [name], MD as PCP - Sleep Medicine (Cardiology) [name], MD (Sports Medicine) [name], MD (Ophthalmology) [name], [name], MD (Dermatology) [name], MD (Gastroenterology) [name], MD (Nephrology) [name], DC as Referring Physician (Chiropractic Medicine)  Extended Emergency Contact Information Primary Emergency Contact: [facility] Address: [address]          [place], [place] [address] [name] of [place] Home Phone: [phone] Mobile Phone: [phone] Relation: Spouse  Goals of Care: Advanced Directive information    03/07/2023    4:08 PM  Advanced Directives  Does Patient Have a Medical Advance Directive? Yes  Type of Advance Directive Living will;Out of facility DNR (pink MOST or yellow form)  Does patient want to make changes to medical advance directive? No - Patient declined       History of Present Illness         83 year old female with a past medical history of recurrent UTI, history of prosthetic joint infection, anemia, hypertension, morbid obesity, OSA, GERD, CHF is evaluated for chronic disease management patient seen and examined in her room  Pt ambulates with her power scooter  States that she gets pain in her lower back intermittently and lately she hasn't had pain Denies hip pain  On tramadol prn for pain   Pt c/o wheezing mostly in the mornings Denies chest pain, cough  She is able to speak in full sentences and does not appear to be in distress  Allergic rhinitis Pt c/o post nasal drip  On flonase, claritin    Recurrent UTI  Pt denies dysuria, urinary frequency, lower abdominal pain     Patient has a history of right hip arthroplasty with removal of hardware, history of infected right hip prosthesis.  She recently followed with Ortho  As per [facility]  Assessment: 1. 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  Plan: Today we reviewed the condition of her right hip by the radiographic studies available. At the time of this dictation I have had a chance to have the CT images reformatted into 3D reconstructions. At this point I have significant concerns regarding her proceeding with attempted reimplantation for a number of reasons including the possibility of significant periacetabular bone loss and possible discontinuity in addition to the complications associated with her greater trochanter displacement in any attempt to try and repair to restore this to normal without significant risks of instability. This would be in addition to any ongoing concerns regarding the primary indication for her resection due to infection. Based on the concerns regarding her acetabular bone stock I recommended that she get the opinion from an academic center regarding the possibility of any surgical consideration with the potential need for custom made implants. Given the complexity as outlined above I am not sure that it would be wise for her to proceed due to high risks of failure or recurrence of infection. I told them that I would track down the images and placed him on a disc. I told them that I would try to contact colleagues at [facility]  to review her case and perhaps send them the images further review prior to any formal referral based on the significant challenges in transportation. If it is felt that she is not a surgical candidate I will review that with him directly and in contrast if  there is any thought that surgery could be performed we could then place a formal referral. I will work to address this as soon as possible and get back to them with any information I am able to ascertain.    Past Medical History:  Diagnosis Date   Benign hypertension with chronic kidney disease, stage III (HCC)    Overview:  Last Assessment & Plan:  Usually the patient is hypertensive. However today her blood pressure is 105 systolic. She has been feeling fatigued with some lightheadedness. Part of this may be related to her lower blood pressure. Her pressure may be improved with her decrease in salt and fluid intake. I've instructed her to put her lisinopril/hctz on hold. 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 It was possibly reentrant. She does well with diltiazem. No change in therapy.   SVT (supraventricular tachycardia) (HCC)    Documented episode in the past, possibly reentrant tachycardia   Thyroid disease    Urinary incontinence    Past Surgical History:  Procedure Laterality Date   CATARACT EXTRACTION Left 8/11   CATARACT EXTRACTION Right 3/12   CHOLECYSTECTOMY  1994   Copsilotomy Laser Treatment Left  6/12   eye   ELBOW SURGERY  8/12   EYE SURGERY Left 09/2008   macular hole   EYE SURGERY Right 12/11   Lumbar Infusion  2011   MOLE REMOVAL  06/17/2015   TOTAL KNEE ARTHROPLASTY Left 6/11   TOTAL KNEE ARTHROPLASTY Right 06/13/2011    reports that she has quit smoking. She has never been exposed to tobacco smoke. She has never used smokeless tobacco. She reports that she does not drink alcohol and does not use drugs. Social History   Socioeconomic History   Marital status: Married    Spouse name: Not on file   Number of children: Not on file   Years of education: Not on file   Highest education level: Not on file  Occupational History   Not on file  Tobacco Use   Smoking status: Former    Passive exposure: Never   Smokeless tobacco: Never  Vaping Use   Vaping status: Never Used  Substance and Sexual Activity   Alcohol use: No   Drug use: No   Sexual activity: Not Currently  Other Topics Concern   Not on file  Social History Narrative  Not on file   Social Drivers of Health   Financial Resource Strain: Low Risk  (01/16/2021)   Received [facility], [facility]   Overall Financial Resource Strain (CARDIA)    Difficulty of Paying Living Expenses: Not hard at all  Food Insecurity: No Food Insecurity (01/11/2023)   Hunger Vital Sign    Worried About Running Out of Food in the Last Year: Never true    Ran Out of Food in the Last Year: Never true  Transportation Needs: No Transportation Needs (01/11/2023)   PRAPARE - Administrator, Civil Service (Medical): No    Lack of Transportation (Non-Medical): No  Physical Activity: Not on file  Stress: No Stress Concern Present (01/13/2021)   Received from [facility], [facility] of Occupational Health - Occupational Stress Questionnaire    Feeling of Stress : Not at all  Social Connections: Unknown (08/28/2021)   Received from [facility], [facility]   Social Network    Social  Network: Not on file  Intimate Partner Violence: Not At Risk (01/11/2023)   Humiliation, Afraid, Rape, and Kick questionnaire    Fear of Current or Ex-Partner: No    Emotionally Abused: No    Physically Abused: No    Sexually Abused: No    Functional Status Survey:    Family History  Problem Relation Age of Onset   High blood pressure Mother    Diabetes Mother    Heart Problems Father    Diabetes Father    Prostate cancer Father    Heart attack Father    Heart attack Paternal Grandmother    Heart attack Maternal Grandfather    Heart attack Paternal Uncle    Stroke Neg Hx     Health Maintenance  Topic Date Due   COVID-19 Vaccine (6 - 2024-25 season) 12/25/2022   Medicare Annual Wellness (AWV)  02/20/2024   DTaP/Tdap/Td (5 - Td or Tdap) 11/25/2030   Pneumonia Vaccine 82+ Years old  Completed   INFLUENZA VACCINE  Completed   DEXA SCAN  Completed   Zoster Vaccines- Shingrix  Completed   HPV VACCINES  Aged Out    Allergies  Allergen Reactions   Ciprofloxacin    Keflex [Cephalexin] Diarrhea   Macrobid [Nitrofurantoin] Hives   Amoxicillin Rash   Cefadroxil Rash    Outpatient Encounter Medications as of 07/17/2023  Medication Sig   albuterol (VENTOLIN HFA) 108 (90 Base) MCG/ACT inhaler Inhale 2 puffs into the lungs every 6 (six) hours as needed for wheezing or shortness of breath.   bifidobacterium infantis (ALIGN) capsule Take 1 capsule by mouth daily.   budesonide-formoterol (SYMBICORT) 160-4.5 MCG/ACT inhaler Inhale 2 puffs into the lungs daily.   Calcium Carb-Cholecalciferol (CALCIUM PLUS VITAMIN D3 PO) Take 1 tablet by mouth daily. Vitamin D3 + Calcium 600   Cholecalciferol (VITAMIN D3) 50 MCG (2000 UT) TABS Take 2 tablets by mouth daily.   ferrous sulfate 325 (65 FE) MG EC tablet Take 325 mg by mouth as directed. Once A Day on Mon, Thu   furosemide (LASIX) 20 MG tablet Take 20 mg by mouth daily.   guaiFENesin 200 MG/10ML LIQD Take 10 mLs by mouth every 4  (four) hours as needed.   ipratropium-albuterol (DUONEB) 0.5-2.5 (3) MG/3ML SOLN Inhale 3 mLs into the lungs every 8 (eight) hours as needed (wheezing).   levothyroxine (SYNTHROID) 25 MCG tablet Take 25 mcg by mouth as directed. Take 1 tablet (25 mcg) along  with 75 mcg=100 mcg   levothyroxine (SYNTHROID) 75 MCG tablet Take 75 mcg by mouth as directed. Take 1 tablet (75 mcg) along with 25 mcg=100 mcg   loperamide (IMODIUM) 2 MG capsule Take 2 mg by mouth every 4 (four) hours as needed for diarrhea or loose stools.   nystatin (MYCOSTATIN/NYSTOP) powder Apply 1 application  topically in the morning, at noon, in the evening, and at bedtime. To genital areas and inner thigh   nystatin cream (MYCOSTATIN) Apply 1 Application topically at bedtime.   ondansetron (ZOFRAN) 4 MG tablet Take 4 mg by mouth every 8 (eight) hours as needed for nausea or vomiting.   pantoprazole (PROTONIX) 40 MG tablet Take 40 mg by mouth daily.   potassium chloride (KLOR-CON M) 10 MEQ tablet Take 3 tablets by mouth daily.   potassium chloride (MICRO-K) 10 MEQ CR capsule Take 10 mEq by mouth once.   traMADol (ULTRAM) 50 MG tablet Take 50 mg by mouth 3 (three) times daily as needed.   triamcinolone cream (KENALOG) 0.5 % Apply 1 Application topically at bedtime.   No facility-administered encounter medications on file as of 07/17/2023.    Review of Systems  Constitutional:  Negative for fever.  HENT:  Negative for sore throat.   Respiratory:  Negative for cough, shortness of breath and wheezing.   Cardiovascular:  Negative for chest pain, palpitations and leg swelling.  Gastrointestinal:  Negative for abdominal distention, abdominal pain, blood in stool, constipation, diarrhea, nausea and vomiting.  Genitourinary:  Negative for dysuria and frequency.  Neurological:  Negative for dizziness and numbness.   Negative unless indicated in HPI.  There were no vitals filed for this visit. There is no height or weight on file to  calculate BMI. BP Readings from Last 3 Encounters:  06/19/23 129/76  06/12/23 122/71  06/08/23 122/71   Wt Readings from Last 3 Encounters:  06/19/23 284 lb (128.8 kg)  06/12/23 284 lb (128.8 kg)  06/08/23 284 lb (128.8 kg)   Physical Exam Constitutional:      Appearance: Normal appearance.  HENT:     Head: Normocephalic and atraumatic.  Cardiovascular:     Rate and Rhythm: Normal rate and regular rhythm.  Pulmonary:     Effort: Pulmonary effort is normal. No respiratory distress.     Breath sounds: Normal breath sounds. No wheezing.  Abdominal:     General: Bowel sounds are normal. There is no distension.     Tenderness: There is no abdominal tenderness. There is no guarding or rebound.     Comments:    Musculoskeletal:        General: No swelling.  Skin:    General: Skin is dry.  Neurological:     Mental Status: She is alert. Mental status is at baseline.     Motor: No weakness.     Comments: Unable to elevate Rt  leg       Labs reviewed: Basic Metabolic Panel: Recent Labs    10/19/22 0339 10/25/22 0000 01/10/23 1938 01/11/23 0500 01/16/23 0000 02/09/23 0000 02/17/23 0000  NA 134*   < > 135 136 136* 137 139  K 3.7   < > 3.8 3.4* 3.4* 4.4 4.4  CL 107   < > 107 108 108 110* 110*  CO2 21*   < > 21* 21* 21 18 20   GLUCOSE 88  --  105* 87  --   --   --   BUN 42*   < > 31* 29*  20 37* 41*  CREATININE 2.04*   < > 2.10* 1.95* 2.0* 2.0* 2.1*  CALCIUM 7.9*   < > 8.7* 8.4* 8.6* 9.0 8.8   < > = values in this interval not displayed.   Liver Function Tests: Recent Labs    10/13/22 1855 11/15/22 0000 01/10/23 0000 01/10/23 1938  AST 20 19 15 21   ALT 17 12 10 17   ALKPHOS 85 84 83 87  BILITOT 1.1  --   --  0.6  PROT 7.0  --   --  7.3  ALBUMIN 2.7* 3.3* 3.3* 3.5   Recent Labs    01/10/23 1938  LIPASE 24   No results for input(s): "AMMONIA" in the last 8760 hours. CBC: Recent Labs    10/19/22 0339 11/15/22 0000 01/10/23 0000 01/10/23 1938  01/11/23 0500 01/16/23 0000 02/09/23 0000  WBC 11.9*   < > 6.0 6.5 6.1 7.5 8.0  NEUTROABS  --    < > 3,168.00 3.6  --  4,770.00  --   HGB 11.5*   < > 10.6* 11.8* 10.8* 10.7* 11.4*  HCT 37.1   < > 33* 39.4 35.3* 34* 36  MCV 89.6  --   --  89.5 89.1  --   --   PLT 261   < > 244 272 230 225 345   < > = values in this interval not displayed.   Cardiac Enzymes: No results for input(s): "CKTOTAL", "CKMB", "CKMBINDEX", "TROPONINI" in the last 8760 hours. BNP: Invalid input(s): "POCBNP" No results found for: "HGBA1C" Lab Results  Component Value Date   TSH 2.119 01/10/2023   Lab Results  Component Value Date   VITAMINB12 738 07/04/2022   Lab Results  Component Value Date   FOLATE 11.3 07/04/2022   Lab Results  Component Value Date   IRON 18 (L) 07/09/2022   TIBC 155 (L) 07/09/2022   FERRITIN 772 (H) 07/09/2022    Imaging and Procedures obtained prior to SNF admission: DG FLUORO GUIDED NEEDLE PLC ASPIRATION/INJECTION LOC Result Date: 07/10/2023 CLINICAL DATA:  Right hip pain. History of right hip arthroplasty and subsequent explantation. Evaluation for infection EXAM: RIGHT HIP ASPIRATION UNDER FLUOROSCOPY COMPARISON:  CT abdomen and pelvis 01/10/2023 FLUOROSCOPY: Radiation Exposure Index (as provided by the fluoroscopic device): 3.70 mGy Kerma PROCEDURE: The overlying skin was prepped with Betadine, draped in the usual sterile fashion, and infiltrated locally with 1% lidocaine. A 3.5 inch 20 gauge spinal needle was advanced to the lateral aspect of the residual proximal right femoral diaphysis which lies within the acetabular fossa. Aspiration yielded 33 mL of serosanguineous fluid which was sent for the requested laboratory studies. The needle was removed and a sterile dressing was applied. There was no immediate complication. IMPRESSION: Technically successful right hip aspiration under fluoroscopy. Electronically Signed   By: Sebastian Ache M.D.   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 Cytosis on recent lab work Patient follows with Ortho and had right hip arthrocentesis Follow-up with Ortho   Chronic heart failure with preserved ejection fraction Lungs clear , no  lower extremity swelling  Continue with Lasix, potassium supplements Avoid salty foods   CKD stage IIIb Avoid nephrotoxic medications  will check BMP   GERD stable Continue with pantoprazole   History of recurrent UTI Continue with cranberry  Hypothyroidism  Cont with synthyroid  COPD  Cont with symbicort , albuterol prn   Low back  pain Continue with tramadol as needed  30 min Total time spent for obtaining history,  performing a medically appropriate examination and evaluation, reviewing the tests,   documenting clinical information in the electronic or other health record,  care coordination (not separately reported)

## 2023-07-25 LAB — CBC: RBC: 3.83 — AB (ref 3.87–5.11)

## 2023-07-25 LAB — BASIC METABOLIC PANEL WITH GFR
BUN: 39 — AB (ref 4–21)
CO2: 21 (ref 13–22)
Chloride: 111 — AB (ref 99–108)
Creatinine: 1.8 — AB (ref 0.5–1.1)
Glucose: 73
Potassium: 4.8 meq/L (ref 3.5–5.1)
Sodium: 139 (ref 137–147)

## 2023-07-25 LAB — CBC AND DIFFERENTIAL
HCT: 34 — AB (ref 36–46)
Hemoglobin: 10.5 — AB (ref 12.0–16.0)
Platelets: 230 10*3/uL (ref 150–400)
WBC: 6.1

## 2023-07-25 LAB — COMPREHENSIVE METABOLIC PANEL WITH GFR
Calcium: 8.1 — AB (ref 8.7–10.7)
eGFR: 28

## 2023-07-27 NOTE — Progress Notes (Deleted)
 Location:  [facility] Room Number: 56A Place of Service:  SNF [phone]) Provider:  Jereld JAYSON Berneda Estelle, NP    Patient Care Team: Sherlynn Madden, MD as PCP - General (Internal Medicine) [name], MD as PCP - Cardiology (Cardiology) [name], MD as PCP - Sleep Medicine (Cardiology) Georgina Lavelle JAYSON., MD (Sports Medicine) [name], MD (Ophthalmology) [name], [name], MD (Dermatology) [name], MD (Gastroenterology) [name], [name], MD (Nephrology) [name], DC as Referring Physician (Chiropractic Medicine)  Extended Emergency Contact Information Primary Emergency Contact: Granlund,[name] Address: [address]          [place], [place] [address] [place] Home Phone: [phone] Mobile Phone: [phone] Relation: Spouse  Code Status:  DNR Goals of care: Advanced Directive information    03/07/2023    4:08 PM  Advanced Directives  Does Patient Have a Medical Advance Directive? Yes  Type of Advance Directive Living will;Out of facility DNR (pink MOST or yellow form)  Does patient want to make changes to medical advance directive? No - Patient declined     Chief Complaint  Patient presents with   Acute Visit    anemia    HPI:  Pt is a 83 y.o. female seen today for an acute visit for    Past Medical History:  Diagnosis Date   Benign hypertension with chronic kidney disease, stage III (HCC)    Overview:  Last Assessment & Plan:  Usually the patient is hypertensive. However today her blood pressure is 105 systolic. She has been feeling fatigued with some lightheadedness. Part of this may be related to her lower blood pressure. Her pressure may be improved with her decrease in salt and fluid intake. I've instructed her to put her lisinopril/hctz on hold. I've asked her to see her primary    Cardiac disease 03/10/2014   Chronic diarrhea 07/27/2015   Edema 07/27/2015    Ejection fraction 2004   Normal, echo,    Gastroesophageal reflux disease    GERD (gastroesophageal reflux disease)    Heart disease 03/10/2014   Hypertension    Left bundle branch block 09/01/2020   Obesity    Obesity (BMI 30-39.9) 09/25/2017   OSA (obstructive sleep apnea)    mild with AHI 6.75 - on CPAP   Preop cardiovascular exam 11/2010   Cardiac clearance for knee surgery    Sleep apnea 03/10/2014   Supraventricular tachycardia (HCC)    Documented episode in the past, possibly reentrant tachycardia  Overview:  Overview:  Documented episode in the past, 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 It was possibly reentrant. She does well with diltiazem . No change in therapy.   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 2 tablet by mouth every 4 hours as needed for fever and pain.   albuterol  (VENTOLIN  HFA) 108 (90 Base) MCG/ACT inhaler Inhale 2 puffs into the lungs every 6 (six) hours as needed for wheezing or shortness of breath.   bifidobacterium infantis  (ALIGN) capsule Take 1 capsule by mouth daily.   budesonide -formoterol (SYMBICORT) 160-4.5 MCG/ACT inhaler Inhale 2 puffs into the lungs daily.   Calcium Carb-Cholecalciferol  (CALCIUM PLUS VITAMIN D3 PO) Take 1 tablet by mouth daily. Vitamin D3 20mcg + Calcium 600   Cholecalciferol  (VITAMIN D3) 50 MCG (2000 UT) TABS Take 2 tablets by mouth daily.   Cranberry 500 MG TABS Take 500 mg by mouth in the morning and at bedtime.   docusate sodium  (COLACE) 100 MG capsule Take 100 mg by mouth daily. Give 100 mg by mouth every 24 hours as needed for constipation   ferrous sulfate  325 (65 FE) MG EC tablet Take 325 mg by mouth as directed. Once A Day on Mon, Thu   furosemide  (LASIX ) 20 MG tablet Take 20 mg by mouth daily.   guaiFENesin  200 MG/10ML LIQD Take 10 mLs by mouth every 4 (four) hours as needed.   hydrocortisone cream 1 % Apply 1 Application topically at bedtime. Apply to urogenital area topically at bedtime every other day for itchy areas related to vaginitis.   ipratropium-albuterol  (DUONEB) 0.5-2.5 (3) MG/3ML SOLN Inhale 3 mLs into the lungs every 8 (eight) hours as needed (wheezing).   levothyroxine  (SYNTHROID ) 25 MCG tablet Take 25 mcg by mouth as directed. Take 1 tablet (25 mcg) along with 75 mcg=100 mcg   levothyroxine  (SYNTHROID ) 75 MCG tablet Take 75 mcg by mouth as directed. Take 1 tablet (75 mcg) along with 25 mcg=100 mcg   loperamide (IMODIUM) 2 MG capsule Take 2 mg by mouth every 4 (four) hours as needed for diarrhea or loose stools.   loratadine (CLARITIN) 10 MG tablet Take 10 mg by mouth daily.   nystatin (MYCOSTATIN/NYSTOP) powder Apply 1 application  topically in the morning, at noon, in the evening, and at bedtime. To genital areas and inner thigh   nystatin cream (MYCOSTATIN) Apply 1 Application topically at bedtime.   ondansetron  (ZOFRAN ) 4 MG tablet Take 4 mg by mouth every 8 (eight) hours as needed for nausea or vomiting.   pantoprazole  (PROTONIX ) 40 MG tablet Take 40 mg by mouth  daily.   potassium chloride  (KLOR-CON  M) 10 MEQ tablet Take 3 tablets by mouth daily.   traMADol (ULTRAM) 50 MG tablet Take 50 mg by mouth every 6 (six) hours as needed.   triamcinolone cream (KENALOG) 0.5 % Apply 1 Application topically at bedtime.   potassium chloride  (MICRO-K ) 10 MEQ CR capsule Take 10 mEq by mouth once. (Patient not taking: Reported on 07/27/2023)   No facility-administered encounter medications on file as of 07/26/2023.    Review of Systems  Immunization History  Administered Date(s) Administered   Influenza Split 01/28/2008, 02/17/2009, 02/20/2012, 02/11/2013, 02/02/2016, 01/11/2017, 02/04/2019   Influenza, High Dose Seasonal PF 02/04/2015, 01/11/2017, 01/30/2018, 02/05/2020, 01/25/2023   Influenza-Unspecified 02/16/2022   Moderna Covid-19 Vaccine Bivalent Booster 37yrs & up 04/13/2023   Moderna SARS-COV2 Booster Vaccination 02/24/2022   Moderna Sars-Covid-2 Vaccination 05/09/2019, 05/27/2019, 03/03/2020, 09/04/2020, 11/04/2020  [employer] 109yrs & up 02/09/2021   Pneumococcal Conjugate-13 08/30/2013   Pneumococcal Polysaccharide-23 11/03/2005   RSV,unspecified 04/22/2022   Td 08/01/2001, 09/05/2019   Tdap 09/22/2010, 11/24/2020   Zoster Recombinant(Shingrix) 11/08/2016, 01/11/2017   Zoster, Live 11/04/2005, 11/08/2016, 01/11/2017   Zoster, Unspecified 01/11/2017   Pertinent  Health Maintenance Due  Topic Date Due   INFLUENZA VACCINE  11/24/2023   DEXA SCAN  Completed      04/15/2022   10:15 AM 04/27/2022   11:35 AM 05/31/2022    9:34 AM 07/26/2022    9:31 AM 10/21/2022    9:31 AM  Fall Risk  Falls in the past year? 0 0 0 0 0  Was there an injury with Fall? 0 0 0 0 0  Fall Risk Category Calculator 0 0 0 0 0  Fall Risk Category (Retired) Low Low     (RETIRED) Patient Fall Risk Level Moderate fall risk Moderate fall risk     Patient at Risk for Falls Due to History of fall(s) History of fall(s) History of fall(s)  No Fall Risks   Fall risk Follow up Falls evaluation completed Falls evaluation completed Falls evaluation completed  Falls evaluation completed   Functional Status Survey:    Vitals:   07/26/23 1354  BP: 138/61  Pulse: 61  Resp: 19  Temp: (!) 97.3 F (36.3 C)  SpO2: 97%  Weight: 289 lb (131.1 kg)  Height: 5' 8 (1.727 m)   Body mass index is 43.94 kg/m. Physical Exam  Labs reviewed: Recent Labs    10/19/22 0339 10/25/22 0000 01/10/23 1938 01/11/23 0500 01/16/23 0000 02/09/23 0000 02/17/23 0000 07/25/23 0000  NA 134*   < > 135 136   < > 137 139 139  K 3.7   < > 3.8 3.4*   < > 4.4 4.4 4.8  CL 107   < > 107 108   < > 110* 110* 111*  CO2 21*   < > 21* 21*   < > 18 20 21   GLUCOSE 88  --  105* 87  --   --   --   --   BUN 42*   < > 31* 29*   < > 37* 41* 39*  CREATININE 2.04*   < > 2.10* 1.95*   < > 2.0* 2.1* 1.8*  CALCIUM 7.9*   < > 8.7* 8.4*   < > 9.0 8.8 8.1*   < > = values in this interval not displayed.   Recent Labs    10/13/22 1855 11/15/22 0000 01/10/23 0000 01/10/23 1938  AST 20 19 15 21   ALT 17 12 10 17   ALKPHOS 85 84 83 87  BILITOT 1.1  --   --  0.6  PROT 7.0  --   --  7.3  ALBUMIN 2.7* 3.3* 3.3* 3.5   Recent Labs    10/19/22 0339 11/15/22 0000 01/10/23 0000 01/10/23 1938 01/11/23 0500 01/16/23 0000 02/09/23 0000 07/25/23 0000  WBC 11.9*   < > 6.0 6.5 6.1 7.5 8.0 6.1  NEUTROABS  --    < > 3,168.00 3.6  --  4,770.00  --   --   HGB 11.5*   < > 10.6* 11.8* 10.8* 10.7* 11.4* 10.5*  HCT 37.1   < > 33* 39.4 35.3* 34* 36 34*  MCV 89.6  --   --  89.5 89.1  --   --   --   PLT 261   < > 244 272  230 225 345 230   < > = values in this interval not displayed.   Lab Results  Component Value Date   TSH 2.119 01/10/2023   No results found for: HGBA1C No results found for: CHOL, HDL, LDLCALC, LDLDIRECT, TRIG, CHOLHDL  Significant Diagnostic Results in last 30 days:  DG FLUORO GUIDED NEEDLE PLC ASPIRATION/INJECTION LOC Result Date:  07/10/2023 CLINICAL DATA:  Right hip pain. History of right hip arthroplasty and subsequent explantation. Evaluation for infection EXAM: RIGHT HIP ASPIRATION UNDER FLUOROSCOPY COMPARISON:  CT abdomen and pelvis 01/10/2023 FLUOROSCOPY: Radiation Exposure Index (as provided by the fluoroscopic device): 3.70 mGy Kerma PROCEDURE: The overlying skin was prepped with Betadine, draped in the usual sterile fashion, and infiltrated locally with 1% lidocaine. A 3.5 inch 20 gauge spinal needle was advanced to the lateral aspect of the residual proximal right femoral diaphysis which lies within the acetabular fossa. Aspiration yielded 33 mL of serosanguineous fluid which was sent for the requested laboratory studies. The needle was removed and a sterile dressing was applied. There was no immediate complication. IMPRESSION: Technically successful right hip aspiration under fluoroscopy. Electronically Signed   By: Dasie Hamburg M.D.   On: 07/10/2023 12:08    Assessment/Plan There are no diagnoses linked to this encounter.   Family/ staff Communication: ***  Labs/tests ordered:  *** This encounter was created in error - please disregard.

## 2023-08-15 ENCOUNTER — Non-Acute Institutional Stay (SKILLED_NURSING_FACILITY): Payer: Self-pay | Admitting: Nurse Practitioner

## 2023-08-15 ENCOUNTER — Encounter: Payer: Self-pay | Admitting: Nurse Practitioner

## 2023-08-15 DIAGNOSIS — F5105 Insomnia due to other mental disorder: Secondary | ICD-10-CM | POA: Diagnosis not present

## 2023-08-15 DIAGNOSIS — E039 Hypothyroidism, unspecified: Secondary | ICD-10-CM

## 2023-08-15 DIAGNOSIS — I5032 Chronic diastolic (congestive) heart failure: Secondary | ICD-10-CM

## 2023-08-15 DIAGNOSIS — I10 Essential (primary) hypertension: Secondary | ICD-10-CM | POA: Diagnosis not present

## 2023-08-15 DIAGNOSIS — N184 Chronic kidney disease, stage 4 (severe): Secondary | ICD-10-CM

## 2023-08-15 DIAGNOSIS — K2100 Gastro-esophageal reflux disease with esophagitis, without bleeding: Secondary | ICD-10-CM | POA: Diagnosis not present

## 2023-08-15 DIAGNOSIS — D500 Iron deficiency anemia secondary to blood loss (chronic): Secondary | ICD-10-CM

## 2023-08-15 DIAGNOSIS — F418 Other specified anxiety disorders: Secondary | ICD-10-CM

## 2023-08-15 NOTE — Assessment & Plan Note (Signed)
 dependent  areas, resumed Furosemide, off Spironolactone, BNP 98 11/15/22,  06/01/23 pulmonology: prn Symbicort, albuterol, DuoNeb

## 2023-08-15 NOTE — Assessment & Plan Note (Signed)
 stable, off Mirtazapine.

## 2023-08-15 NOTE — Assessment & Plan Note (Signed)
 stable, on  Pantoprazole , off  Omeprazole, prn Zofran . Hgb 10.5 07/25/23

## 2023-08-15 NOTE — Assessment & Plan Note (Signed)
 TSH 2.119 01/10/23, on Levothyroxine.

## 2023-08-15 NOTE — Assessment & Plan Note (Signed)
 Mild elevated Sbp in 140s, resumed Furosemide , off Spironolactone .

## 2023-08-15 NOTE — Assessment & Plan Note (Signed)
 f/u Nephrology, Bun/creat 39/1.8 07/25/23

## 2023-08-15 NOTE — Assessment & Plan Note (Signed)
 post op, s/p EGD no active bleed. S/p 6 u PRBC transfusion, ASA was dc'd, On Fe, Iron 15 in hospital, Hgb 10. 5 07/25/23

## 2023-08-15 NOTE — Progress Notes (Unsigned)
 Location:  [facility] Room Number: N056-A Place of Service:  SNF (31) Provider:  Saadiya Wilfong X, NP    Tye Gall, MD  Patient Care Team: Tye Gall, MD as PCP - General (Internal Medicine) Liza Riggers, MD as PCP - Cardiology (Cardiology) Jacqueline Matsu, MD as PCP - Sleep Medicine (Cardiology) Arloa Berg., MD (Sports Medicine) Patsey Booth, MD (Ophthalmology) [name], Earla Glassman, MD (Dermatology) Lanita Pitman, MD (Gastroenterology) Forest Idol, MD (Nephrology) Suzi Essex, DC as Referring Physician (Chiropractic Medicine)  Extended Emergency Contact Information Primary Emergency Contact: Burback,[name] Address: [address]          [place], [place] [address] [place] Home Phone: [phone] Mobile Phone: [phone] Relation: Spouse  Code Status:  DNR Goals of care: Advanced Directive information    08/15/2023   11:22 AM  Advanced Directives  Does Patient Have a Medical Advance Directive? Yes  Type of Advance Directive Living will;Out of facility DNR (pink MOST or yellow form)  Does patient want to make changes to medical advance directive? No - Patient declined  Pre-existing out of facility DNR order (yellow form or pink MOST form) Yellow form placed in chart (order not valid for inpatient use)     Chief Complaint  Patient presents with  . Medical Management of Chronic Issues    Routine Visit    HPI:  Pt is a 83 y.o. female seen today for medical management of chronic diseases.     lower back pain, controlled, taking Tramadol and Tylenol , X-ray lumbar spine, sacrococcyx 06/08/23 negative fx.  UTI, treated with Doxycycline , no urinary symptoms.               Hospitalized 01/10/23-01/12/23 for UTI, diverticulitis, treated with Zosyn , Cefadroxil , Flagyl , developed pruritic rash, then ID changed to Cipro  and Flagyl -complete 01/19/23, last colonoscopy 2018. Has x1  Fosfomycin.  Hx of UTI, prosthetic joint infection 2022, cellulitis/abscess of the right hip after hardware removed 3/202               Hospitalized for AKI 10/13/22-10/19/22 for AKI, resumed Furosemide               Hospitalized 07/08/22 for CHF, abscess of the right hip which previous infected, treated, and prosthesis was removed and never replaced.               Hospitalized 07/02/22-07/07/22 for the right hip cellulitis, CT hip in hospital fluid collection unclear if it communicates with the joint space, Ortho not felt to be related to the joint space and may be an old fluid collection, aspiration not recommended.              US  06/10/23 [facility], avascular fluid collection, pending Ortho f/u, wbc 8.3 06/08/23             Absent hip, s/p right THR 2018, removal of hardware 12/29/20,  takes Tylenol . F/u Ortho. TDWB. Power wc. 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 Fully treated, then off antibiotics. 11/17/22 ID no infection, no tx, healed.               Elevated AST/ALT normalized, S/p cholecystectomy, US  02/19/21 no cyst or mass.                    Anemia, post op, s/p EGD no active bleed. S/p 6 u PRBC transfusion, ASA was dc'd, On Fe, Iron 15 in hospital, Hgb 10. 5  07/25/23             Muscle spasm, left lower back, resolved, off Gabapentin , failed Methocarbamol              HTN  resumed Furosemide , off Spironolactone .              Morbid obesity             OSA CPAP             Chronic diarrhea, prn Imodium, Align             RA f/u Rheumatology, hx of Plaquenil use, stable presently.              Insomnia/depression/anxiety, stable, off Mirtazapine .              GERD, stable, on  Pantoprazole , off  Omeprazole, prn Zofran . Hgb 10.5 07/25/23             CHF/Edema, dependent  areas, resumed Furosemide , off Spironolactone , BNP 98 11/15/22,  06/01/23 pulmonology: prn Symbicort, albuterol , DuoNeb CKD stage 4, f/u Nephrology, Bun/creat 39/1.8 07/25/23 Hyponatremia, Na 139 07/25/23              Hypothyroidism, TSH 2.119 01/10/23, on Levothyroxine .                     Past Medical History:  Diagnosis Date  . Benign hypertension with chronic kidney disease, stage III (HCC)    Overview:  Last Assessment & Plan:  Usually the patient is hypertensive. However today her blood pressure is 105 systolic. She has been feeling fatigued with some lightheadedness. Part of this may be related to her lower blood pressure. Her pressure may be improved with her decrease in salt and fluid intake. I've instructed her to put her lisinopril/hctz on hold. I've asked her to see her primary   . Cardiac disease 03/10/2014  . Chronic diarrhea 07/27/2015  . Edema 07/27/2015  . Ejection fraction 2004   Normal, echo,   . Gastroesophageal reflux disease   . GERD (gastroesophageal reflux disease)   . Heart disease 03/10/2014  . Hypertension   . Left bundle branch block 09/01/2020  . Obesity   . Obesity (BMI 30-39.9) 09/25/2017  . OSA (obstructive sleep apnea)    mild with AHI 6.75 - on CPAP  . Preop cardiovascular exam 11/2010   Cardiac clearance for knee surgery   . Sleep apnea 03/10/2014  . 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 It was possibly reentrant. She does well with diltiazem . No change in therapy.  . 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 Thyroid  disease   . Urinary incontinence    Past Surgical History:  Procedure Laterality Date  . CATARACT EXTRACTION Left 8/11  . CATARACT EXTRACTION Right 3/12  . CHOLECYSTECTOMY  1994  . Copsilotomy Laser Treatment Left 6/12   eye  . ELBOW SURGERY  8/12  . 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 EYE SURGERY Right 12/11  .  Lumbar Infusion  2011  . MOLE REMOVAL  06/17/2015  . 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 TOTAL KNEE ARTHROPLASTY Right 06/13/2011    Allergies  Allergen Reactions  .  Ciprofloxacin    . Keflex [Cephalexin] Diarrhea  . Macrobid [Nitrofurantoin] Hives  . Amoxicillin Rash  . Cefadroxil  Rash    Outpatient Encounter Medications as of 08/15/2023  Medication Sig  . acetaminophen  (TYLENOL ) 325 MG tablet Take 325 mg by mouth every 4 (four) hours as needed. 2 tablet by mouth every 4 hours as needed for fever and pain.  . albuterol  (VENTOLIN  HFA) 108 (90 Base) MCG/ACT inhaler Inhale 2 puffs into the lungs every 6 (six) hours as needed for wheezing or shortness of breath.  . bifidobacterium infantis (ALIGN) capsule Take 1 capsule by mouth daily.  . budesonide -formoterol (SYMBICORT) 160-4.5 MCG/ACT inhaler Inhale 2 puffs into the lungs daily.  . Calcium Carb-Cholecalciferol  (CALCIUM PLUS VITAMIN D3 PO) Take 1 tablet by mouth daily. Vitamin D3 20mcg + Calcium 600  . Cholecalciferol  (VITAMIN D3) 50 MCG (2000 UT) TABS Take 2 tablets by mouth daily.  . Cranberry 500 MG TABS Take 500 mg by mouth in the morning and at bedtime.  . docusate sodium  (COLACE) 100 MG capsule Take 100 mg by mouth daily. Give 100 mg by mouth every 24 hours as needed for constipation  . ferrous sulfate  325 (65 FE) MG EC tablet Take 325 mg by mouth as directed. Once A Day on Mon, Thu  . furosemide  (LASIX ) 20 MG tablet Take 20 mg by mouth daily.  . guaiFENesin  200 MG/10ML LIQD Take 10 mLs by mouth every 4 (four) hours as needed.  . hydrocortisone cream 1 % Apply 1 Application topically at bedtime. Apply to urogenital area topically at bedtime every other day for itchy areas related to vaginitis.  Aaron Aas ipratropium-albuterol  (DUONEB) 0.5-2.5 (3) MG/3ML SOLN Inhale 3 mLs into the lungs every 8 (eight) hours as needed (wheezing).  . levothyroxine  (SYNTHROID ) 25 MCG tablet Take 25 mcg by mouth as directed. Take 1 tablet (25 mcg) along with 75  mcg=100 mcg  . levothyroxine  (SYNTHROID ) 75 MCG tablet Take 75 mcg by mouth as directed. Take 1 tablet (75 mcg) along with 25 mcg=100 mcg  . loperamide (IMODIUM) 2 MG capsule Take 2 mg by mouth every 4 (four) hours as needed for diarrhea or loose stools.  Aaron Aas loratadine (CLARITIN) 10 MG tablet Take 10 mg by mouth daily.  Aaron Aas nystatin (MYCOSTATIN/NYSTOP) powder Apply 1 application  topically in the morning, at noon, in the evening, and at bedtime. To genital areas and inner thigh  . nystatin cream (MYCOSTATIN) Apply 1 Application topically at bedtime.  . ondansetron  (ZOFRAN ) 4 MG tablet Take 4 mg by mouth every 8 (eight) hours as needed for nausea or vomiting.  . pantoprazole  (PROTONIX ) 40 MG tablet Take 40 mg by mouth daily.  . potassium chloride  (KLOR-CON  M) 10 MEQ tablet Take 3 tablets by mouth daily.  . traMADol (ULTRAM) 50 MG tablet Take 50 mg by mouth every 6 (six) hours as needed.  . triamcinolone cream (KENALOG) 0.5 % Apply 1 Application topically at bedtime.  . potassium chloride  (MICRO-K ) 10 MEQ CR capsule Take 10 mEq by mouth once. (Patient not taking: Reported on 08/15/2023)   No facility-administered encounter medications on file as of 08/15/2023.    Review of Systems  Constitutional:  Negative for appetite change, fatigue and fever.  HENT:  Negative for congestion, sore throat and trouble swallowing.   Eyes:  Negative for visual disturbance.  Respiratory:  Negative for cough, shortness of breath and wheezing.   Cardiovascular:  Positive for leg swelling.  Gastrointestinal:  Negative for constipation.  Genitourinary:  Positive for  frequency. Negative for dysuria and urgency.  Musculoskeletal:  Positive for arthralgias, back pain and gait problem.       Mid to lower back pain Lower back pain  Skin:  Negative for color change.  Neurological:  Negative for speech difficulty and weakness.  Psychiatric/Behavioral:  Negative for confusion and sleep disturbance. The patient is not  nervous/anxious.     Immunization History  Administered Date(s) Administered  . Fluzone Influenza virus vaccine,trivalent (IIV3), split virus 02/20/2012, 02/11/2013, 02/02/2016, 01/11/2017, 02/04/2019  . Influenza Split 01/28/2008, 02/17/2009  . Influenza, High Dose Seasonal PF 02/04/2015, 01/11/2017, 01/30/2018, 02/05/2020, 01/25/2023  . Influenza-Unspecified 02/16/2022  . Moderna Covid-19 Vaccine Bivalent Booster 28yrs & up 04/13/2023  . Moderna SARS-COV2 Booster Vaccination 02/24/2022  . Moderna Sars-Covid-2 Vaccination 04/29/2019, 05/09/2019, 05/27/2019, 03/03/2020, 09/04/2020, 11/04/2020  . Pfizer Covid-19 Vaccine Bivalent Booster 28yrs & up 02/09/2021  . Pneumococcal Conjugate-13 08/30/2013  . Pneumococcal Polysaccharide-23 11/03/2005  . RSV,unspecified 04/22/2022  . Td 08/01/2001, 09/05/2019  . Td (Adult) 08/01/2001  . Tdap 09/22/2010, 11/24/2020  . Zoster Recombinant(Shingrix) 11/08/2016, 01/11/2017  . Zoster, Live 11/04/2005, 11/08/2016, 01/11/2017  . Zoster, Unspecified 01/11/2017   Pertinent  Health Maintenance Due  Topic Date Due  . INFLUENZA VACCINE  11/24/2023  . DEXA SCAN  Completed      04/15/2022   10:15 AM 04/27/2022   11:35 AM 05/31/2022    9:34 AM 07/26/2022    9:31 AM 10/21/2022    9:31 AM  Fall Risk  Falls in the past year? 0 0 0 0 0  Was there an injury with Fall? 0 0 0 0 0  Fall Risk Category Calculator 0 0 0 0 0  Fall Risk Category (Retired) Low Low     (RETIRED) Patient Fall Risk Level Moderate fall risk Moderate fall risk     Patient at Risk for Falls Due to History of fall(s) History of fall(s) History of fall(s)  No Fall Risks  Fall risk Follow up Falls evaluation completed Falls evaluation completed Falls evaluation completed  Falls evaluation completed   Functional Status Survey:    Vitals:   08/15/23 1049  BP: (!) 146/66  Pulse: (!) 57  Resp: 19  Temp: (!) 97.3 F (36.3 C)  SpO2: 97%  Weight: 289 lb (131.1 kg)  Height: 5\' 8"  (1.727 m)    Body mass index is 43.94 kg/m. Physical Exam Vitals and nursing note reviewed.  Constitutional:      Appearance: Normal appearance. She is obese.  HENT:     Head: Normocephalic and atraumatic.     Nose: Nose normal.     Mouth/Throat:     Mouth: Mucous membranes are moist.  Eyes:     Extraocular Movements: Extraocular movements intact.     Conjunctiva/sclera: Conjunctivae normal.     Pupils: Pupils are equal, round, and reactive to light.  Cardiovascular:     Rate and Rhythm: Normal rate and regular rhythm.     Heart sounds: No murmur heard. Pulmonary:     Effort: Pulmonary effort is normal.     Breath sounds: No rales.  Abdominal:     General: Bowel sounds are normal.     Palpations: Abdomen is soft.     Tenderness: There is no abdominal tenderness.  Musculoskeletal:        General: No tenderness.     Cervical back: Normal range of motion and neck supple.     Right lower leg: Edema present.     Left lower leg: Edema  present.     Comments: R hip, prosthetic hip removed. Dependent edema moderate. Lower back pain is controlled.   Skin:    General: Skin is warm and dry.     Comments: Right hip surgical scar.  Edema mostly in the dependent areas, buttock, thighs, legs.   Neurological:     General: No focal deficit present.     Mental Status: She is alert and oriented to person, place, and time.     Gait: Gait abnormal.  Psychiatric:        Mood and Affect: Mood normal.        Behavior: Behavior normal.        Thought Content: Thought content normal.        Judgment: Judgment normal.    Labs reviewed: Recent Labs    10/19/22 0339 10/25/22 0000 01/10/23 1938 01/11/23 0500 01/16/23 0000 02/09/23 0000 02/17/23 0000 07/25/23 0000  NA 134*   < > 135 136   < > 137 139 139  K 3.7   < > 3.8 3.4*   < > 4.4 4.4 4.8  CL 107   < > 107 108   < > 110* 110* 111*  CO2 21*   < > 21* 21*   < > 18 20 21   GLUCOSE 88  --  105* 87  --   --   --   --   BUN 42*   < > 31* 29*   < >  37* 41* 39*  CREATININE 2.04*   < > 2.10* 1.95*   < > 2.0* 2.1* 1.8*  CALCIUM 7.9*   < > 8.7* 8.4*   < > 9.0 8.8 8.1*   < > = values in this interval not displayed.   Recent Labs    10/13/22 1855 11/15/22 0000 01/10/23 0000 01/10/23 1938  AST 20 19 15 21   ALT 17 12 10 17   ALKPHOS 85 84 83 87  BILITOT 1.1  --   --  0.6  PROT 7.0  --   --  7.3  ALBUMIN 2.7* 3.3* 3.3* 3.5   Recent Labs    10/19/22 0339 11/15/22 0000 01/10/23 0000 01/10/23 1938 01/11/23 0500 01/16/23 0000 02/09/23 0000 07/25/23 0000  WBC 11.9*   < > 6.0 6.5 6.1 7.5 8.0 6.1  NEUTROABS  --    < > 3,168.00 3.6  --  4,770.00  --   --   HGB 11.5*   < > 10.6* 11.8* 10.8* 10.7* 11.4* 10.5*  HCT 37.1   < > 33* 39.4 35.3* 34* 36 34*  MCV 89.6  --   --  89.5 89.1  --   --   --   PLT 261   < > 244 272 230 225 345 230   < > = values in this interval not displayed.   Lab Results  Component Value Date   TSH 2.119 01/10/2023   No results found for: "HGBA1C" No results found for: "CHOL", "HDL", "LDLCALC", "LDLDIRECT", "TRIG", "CHOLHDL"  Significant Diagnostic Results in last 30 days:  No results found.  Assessment/Plan Blood loss anemia post op, s/p EGD no active bleed. S/p 6 u PRBC transfusion, ASA was dc'd, On Fe, Iron 15 in hospital, Hgb 10. 5 07/25/23  Essential hypertension Blood pressure is controlled, resumed Furosemide , off Spironolactone .   Insomnia secondary to depression with anxiety  stable, off Mirtazapine .   GERD (gastroesophageal reflux disease) stable, on  Pantoprazole , off  Omeprazole, prn Zofran . Hgb 10.5 07/25/23  Chronic heart failure with preserved ejection fraction (HFpEF) (HCC) dependent  areas, resumed Furosemide , off Spironolactone , BNP 98 11/15/22,  06/01/23 pulmonology: prn Symbicort, albuterol , DuoNeb  CKD (chronic kidney disease) stage 4, GFR 15-29 ml/min (HCC)  f/u Nephrology, Bun/creat 39/1.8 07/25/23  Hypothyroidism TSH 2.119 01/10/23, on Levothyroxine .      Family/ staff  Communication: plan of care reviewed with the patient and charge nurse.   Labs/tests ordered:  none

## 2023-08-17 ENCOUNTER — Encounter: Payer: Self-pay | Admitting: Nurse Practitioner

## 2023-08-24 LAB — CBC AND DIFFERENTIAL
HCT: 34 — AB (ref 36–46)
Hemoglobin: 10.6 — AB (ref 12.0–16.0)
Platelets: 240 K/uL (ref 150–400)
WBC: 5.9

## 2023-08-24 LAB — CBC: RBC: 3.82 — AB (ref 3.87–5.11)

## 2023-09-12 ENCOUNTER — Encounter: Payer: Self-pay | Admitting: Nurse Practitioner

## 2023-09-12 ENCOUNTER — Non-Acute Institutional Stay (SKILLED_NURSING_FACILITY): Payer: Self-pay | Admitting: Nurse Practitioner

## 2023-09-12 DIAGNOSIS — N184 Chronic kidney disease, stage 4 (severe): Secondary | ICD-10-CM

## 2023-09-12 DIAGNOSIS — B372 Candidiasis of skin and nail: Secondary | ICD-10-CM

## 2023-09-12 DIAGNOSIS — Z89621 Acquired absence of right hip joint: Secondary | ICD-10-CM

## 2023-09-12 DIAGNOSIS — D649 Anemia, unspecified: Secondary | ICD-10-CM | POA: Diagnosis not present

## 2023-09-12 DIAGNOSIS — E039 Hypothyroidism, unspecified: Secondary | ICD-10-CM

## 2023-09-12 DIAGNOSIS — K2100 Gastro-esophageal reflux disease with esophagitis, without bleeding: Secondary | ICD-10-CM

## 2023-09-12 DIAGNOSIS — I10 Essential (primary) hypertension: Secondary | ICD-10-CM | POA: Diagnosis not present

## 2023-09-12 DIAGNOSIS — I5032 Chronic diastolic (congestive) heart failure: Secondary | ICD-10-CM

## 2023-09-12 NOTE — Assessment & Plan Note (Signed)
 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 TDWB. Power wc. 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 Fully treated, then off antibiotics. 11/17/22 ID no infection, no tx, healed.

## 2023-09-12 NOTE — Assessment & Plan Note (Signed)
 post op, s/p EGD no active bleed. S/p 6 u PRBC transfusion, ASA was dc'd, On Fe, Iron 15 in hospital, Hgb 10. 5 07/25/23

## 2023-09-12 NOTE — Assessment & Plan Note (Addendum)
 f/u Nephrology, Bun/creat 39/1.8 07/25/23 Repeat CBC/diff, CMP/eGFR, TSH 09/19/23 prior to nephrology follow up

## 2023-09-12 NOTE — Assessment & Plan Note (Signed)
 In skin folds, continue Triamcinolone, Nystatin topical.

## 2023-09-12 NOTE — Progress Notes (Signed)
 Location:   [facility] Room Number: 99 Place of Service:  SNF (31) Provider: Abner Hoffman Zohair Epp NP  Tye Gall, MD  Patient Care Team: Tye Gall, MD as PCP - General (Internal Medicine) Liza Riggers, MD as PCP - Cardiology (Cardiology) Jacqueline Matsu, MD as PCP - Sleep Medicine (Cardiology) Arloa Berg., MD (Sports Medicine) Patsey Booth, MD (Ophthalmology) [name], Carlin Bullard, MD (Dermatology) Lanita Pitman, MD (Gastroenterology) [name], [name], MD (Nephrology) Suzi Essex, DC as Referring Physician (Chiropractic Medicine)  Extended Emergency Contact Information Primary Emergency Contact: Wolgamott,[name] Address: [address]          [place], [place] [address] [place] Home Phone: [phone] Mobile Phone: [phone] Relation: Spouse  Code Status:  DNR Goals of care: Advanced Directive information    08/15/2023   11:22 AM  Advanced Directives  Does Patient Have a Medical Advance Directive? Yes  Type of Advance Directive Living will;Out of facility DNR (pink MOST or yellow form)  Does patient want to make changes to medical advance directive? No - Patient declined  Pre-existing out of facility DNR order (yellow form or pink MOST form) Yellow form placed in chart (order not valid for inpatient use)     Chief Complaint  Patient presents with   Medical Management of Chronic Issues    HPI:  Pt is a 83 y.o. female seen today for medical management of chronic diseases.       lower back pain, controlled, prn Tramadol and Tylenol  available to her, X-ray lumbar spine, sacrococcyx 06/08/23 negative fx.              Hospitalized 01/10/23-01/12/23 for UTI, diverticulitis, treated with Zosyn , Cefadroxil , Flagyl , developed pruritic rash, then ID changed to Cipro  and Flagyl -complete 01/19/23, last colonoscopy 2018. Has x1 Fosfomycin.    Hospitalized for AKI 10/13/22-10/19/22 for AKI             Hospitalized  07/08/22 for CHF, abscess of the right hip which previous infected, treated, and prosthesis was removed and never replaced.               Hospitalized 07/02/22-07/07/22 for the right hip cellulitis, CT hip in hospital fluid collection unclear if it communicates with the joint space, Ortho not felt to be related to the joint space and may be an old fluid collection, aspiration not recommended. 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 [facility]. Power wc. 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 Fully treated, then off antibiotics. 11/17/22 ID no infection, no tx, healed.               Elevated AST/ALT normalized, S/p cholecystectomy, US  02/19/21 no cyst or mass.                    Anemia, post op, s/p EGD no active bleed. S/p 6 u PRBC transfusion, ASA was dc'd, On Fe, Iron 15 in hospital, Hgb 10. 5 07/25/23             Muscle spasm, left lower back, resolved, off Gabapentin , failed Methocarbamol              HTN on Furosemide , off Spironolactone .  Morbid obesity             OSA CPAP             Chronic diarrhea, prn Imodium, Align             RA f/u Rheumatology, hx of Plaquenil use, stable presently.              Insomnia/depression/anxiety, stable, off Mirtazapine .              GERD, stable, on  Pantoprazole , off  Omeprazole, prn Zofran . Hgb 10.5 07/25/23             CHF/Edema, dependent  areas, resumed Furosemide , off Spironolactone , BNP 98 11/15/22,  06/01/23 pulmonology: prn Symbicort, albuterol , DuoNeb CKD stage 4, f/u Nephrology, Bun/creat 39/1.8 07/25/23 Hyponatremia, Na 139 07/25/23             Hypothyroidism, TSH 2.119 01/10/23, on Levothyroxine .                      Past Medical History:  Diagnosis Date   Benign hypertension with chronic kidney disease, stage III (HCC)    Overview:   Last Assessment & Plan:  Usually the patient is hypertensive. However today her blood pressure is 105 systolic. She has been feeling fatigued with some lightheadedness. Part of this may be related to her lower blood pressure. Her pressure may be improved with her decrease in salt and fluid intake. I've instructed her to put her lisinopril/hctz on hold. 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 It was possibly reentrant. She does well with diltiazem . No change in therapy.   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   TOTAL KNEE ARTHROPLASTY Right 06/13/2011    Allergies  Allergen Reactions   Ciprofloxacin     Keflex [Cephalexin] Diarrhea    Macrobid [Nitrofurantoin] Hives   Amoxicillin Rash   Cefadroxil  Rash    Allergies as of 09/12/2023       Reactions   Ciprofloxacin     Keflex [cephalexin] Diarrhea   Macrobid [nitrofurantoin] Hives   Amoxicillin Rash  Cefadroxil  Rash        Medication List        Accurate as of Sep 12, 2023 11:59 PM. If you have any questions, ask your nurse or doctor.          acetaminophen  325 MG tablet Commonly known as: TYLENOL  Take 325 mg by mouth every 4 (four) hours as needed. 2 tablet by mouth every 4 hours as needed for fever and pain.   albuterol  108 (90 Base) MCG/ACT inhaler Commonly known as: VENTOLIN  HFA Inhale 2 puffs into the lungs every 6 (six) hours as needed for wheezing or shortness of breath.   bifidobacterium infantis capsule Take 1 capsule by mouth daily.   budesonide -formoterol 160-4.5 MCG/ACT inhaler Commonly known as: SYMBICORT Inhale 2 puffs into the lungs daily.   CALCIUM PLUS VITAMIN D3 PO Take 1 tablet by mouth daily. Vitamin D3 20mcg + Calcium 600   Cranberry 500 MG Tabs Take 500 mg by mouth in the morning and at bedtime.   docusate sodium  100 MG capsule Commonly known as: COLACE Take 100 mg by mouth daily. Give 100 mg by mouth every 24 hours as needed for constipation   ferrous sulfate  325 (65 FE) MG EC tablet Take 325 mg by mouth as directed. Once A Day on Mon, Thu   furosemide  20 MG tablet Commonly known as: LASIX  Take 20 mg by mouth daily.   guaiFENesin  200 MG/10ML Liqd Take 10 mLs by mouth every 4 (four) hours as needed.   hydrocortisone cream 1 % Apply 1 Application topically at bedtime. Apply to urogenital area topically at bedtime every other day for itchy areas related to vaginitis.   ipratropium-albuterol  0.5-2.5 (3) MG/3ML Soln Commonly known as: DUONEB Inhale 3 mLs into the lungs every 8 (eight) hours as needed (wheezing).   levothyroxine  25 MCG tablet Commonly known as: SYNTHROID  Take 25 mcg by mouth as directed. Take 1  tablet (25 mcg) along with 75 mcg=100 mcg   levothyroxine  75 MCG tablet Commonly known as: SYNTHROID  Take 75 mcg by mouth as directed. Take 1 tablet (75 mcg) along with 25 mcg=100 mcg   loperamide 2 MG capsule Commonly known as: IMODIUM Take 2 mg by mouth every 4 (four) hours as needed for diarrhea or loose stools.   loratadine 10 MG tablet Commonly known as: CLARITIN Take 10 mg by mouth daily.   nystatin powder Commonly known as: MYCOSTATIN/NYSTOP Apply 1 application  topically in the morning, at noon, in the evening, and at bedtime. To genital areas and inner thigh   nystatin cream Commonly known as: MYCOSTATIN Apply 1 Application topically at bedtime.   ondansetron  4 MG tablet Commonly known as: ZOFRAN  Take 4 mg by mouth every 8 (eight) hours as needed for nausea or vomiting.   potassium chloride  10 MEQ CR capsule Commonly known as: MICRO-K  Take 10 mEq by mouth once.   potassium chloride  10 MEQ tablet Commonly known as: KLOR-CON  M Take 3 tablets by mouth daily.   Protonix  40 MG tablet Generic drug: pantoprazole  Take 40 mg by mouth daily.   traMADol 50 MG tablet Commonly known as: ULTRAM Take 50 mg by mouth every 6 (six) hours as needed.   triamcinolone cream 0.5 % Commonly known as: KENALOG Apply 1 Application topically at bedtime.   Vitamin D3 50 MCG (2000 UT) Tabs Take 2 tablets by mouth daily.        Review of Systems  Constitutional:  Negative for appetite change, fatigue and fever.  HENT:  Negative for congestion, sore throat and trouble swallowing.   Eyes:  Negative for visual disturbance.  Respiratory:  Negative for cough, shortness of breath and wheezing.   Cardiovascular:  Positive for leg swelling.  Gastrointestinal:  Negative for constipation.  Genitourinary:  Positive for frequency. Negative for dysuria and urgency.  Musculoskeletal:  Positive for arthralgias, back pain and gait problem.       Mid to lower back pain Lower back pain  Skin:   Negative for color change.  Neurological:  Negative for speech difficulty and weakness.  Psychiatric/Behavioral:  Negative for confusion and sleep disturbance. The patient is not nervous/anxious.     Immunization History  Administered Date(s) Administered   Fluzone Influenza virus vaccine,trivalent (IIV3), split virus 02/20/2012, 02/11/2013, 02/02/2016, 01/11/2017, 02/04/2019   Influenza Split 01/28/2008, 02/17/2009   Influenza, High Dose Seasonal PF 02/04/2015, 01/11/2017, 01/30/2018, 02/05/2020, 01/25/2023   Influenza-Unspecified 02/16/2022   Moderna Covid-19 Vaccine Bivalent Booster 78yrs & up 04/13/2023   Moderna SARS-COV2 Booster Vaccination 02/24/2022   Moderna Sars-Covid-2 Vaccination 04/29/2019, 05/09/2019, 05/27/2019, 03/03/2020, 09/04/2020, 11/04/2020   Pfizer Covid-19 Vaccine Bivalent Booster 62yrs & up 02/09/2021   Pneumococcal Conjugate-13 08/30/2013   Pneumococcal Polysaccharide-23 11/03/2005   RSV,unspecified 04/22/2022   Td 08/01/2001, 09/05/2019   Td (Adult) 08/01/2001   Tdap 09/22/2010, 11/24/2020   Zoster Recombinant(Shingrix) 11/08/2016, 01/11/2017   Zoster, Live 11/04/2005, 11/08/2016, 01/11/2017   Zoster, Unspecified 01/11/2017   Pertinent  Health Maintenance Due  Topic Date Due   INFLUENZA VACCINE  11/24/2023   DEXA SCAN  Completed      04/15/2022   10:15 AM 04/27/2022   11:35 AM 05/31/2022    9:34 AM 07/26/2022    9:31 AM 10/21/2022    9:31 AM  Fall Risk  Falls in the past year? 0 0 0 0 0  Was there an injury with Fall? 0 0 0 0 0  Fall Risk Category Calculator 0 0 0 0 0  Fall Risk Category (Retired) Low Low     (RETIRED) Patient Fall Risk Level Moderate fall risk Moderate fall risk     Patient at Risk for Falls Due to History of fall(s) History of fall(s) History of fall(s)  No Fall Risks  Fall risk Follow up Falls evaluation completed Falls evaluation completed Falls evaluation completed  Falls evaluation completed   Functional Status Survey:     Vitals:   09/12/23 1232  BP: (!) 124/58  Pulse: (!) 59  Resp: 20  Temp: 97.7 F (36.5 C)  SpO2: 97%  Weight: 296 lb 14.4 oz (134.7 kg)   Body mass index is 45.14 kg/m. Physical Exam Vitals and nursing note reviewed.  Constitutional:      Appearance: Normal appearance. She is obese.  HENT:     Head: Normocephalic and atraumatic.     Nose: Nose normal.     Mouth/Throat:     Mouth: Mucous membranes are moist.  Eyes:     Extraocular Movements: Extraocular movements intact.     Conjunctiva/sclera: Conjunctivae normal.     Pupils: Pupils are equal, round, and reactive to light.  Cardiovascular:     Rate and Rhythm: Normal rate and regular rhythm.     Heart sounds: No murmur heard. Pulmonary:     Effort: Pulmonary effort is normal.     Breath sounds: No rales.  Abdominal:     General: Bowel sounds are normal.     Palpations: Abdomen is soft.     Tenderness: There is no abdominal tenderness.  Musculoskeletal:        General: No tenderness.     Cervical back: Normal range of motion and neck supple.     Right lower leg: Edema present.     Left lower leg: Edema present.     Comments: R hip, prosthetic hip removed. Dependent edema moderate. Lower back pain is controlled.   Skin:    General: Skin is warm and dry.     Comments: Right hip surgical scar.  Edema mostly in the dependent areas, buttock, thighs, legs.   Neurological:     General: No focal deficit present.     Mental Status: She is alert and oriented to person, place, and time.     Gait: Gait abnormal.  Psychiatric:        Mood and Affect: Mood normal.        Behavior: Behavior normal.        Thought Content: Thought content normal.        Judgment: Judgment normal.     Labs reviewed: Recent Labs    10/19/22 0339 10/25/22 0000 01/10/23 1938 01/11/23 0500 01/16/23 0000 02/09/23 0000 02/17/23 0000 07/25/23 0000  NA 134*   < > 135 136   < > 137 139 139  K 3.7   < > 3.8 3.4*   < > 4.4 4.4 4.8  CL 107    < > 107 108   < > 110* 110* 111*  CO2 21*   < > 21* 21*   < > 18 20 21   GLUCOSE 88  --  105* 87  --   --   --   --   BUN 42*   < > 31* 29*   < > 37* 41* 39*  CREATININE 2.04*   < > 2.10* 1.95*   < > 2.0* 2.1* 1.8*  CALCIUM 7.9*   < > 8.7* 8.4*   < > 9.0 8.8 8.1*   < > = values in this interval not displayed.   Recent Labs    10/13/22 1855 11/15/22 0000 01/10/23 0000 01/10/23 1938  AST 20 19 15 21   ALT 17 12 10 17   ALKPHOS 85 84 83 87  BILITOT 1.1  --   --  0.6  PROT 7.0  --   --  7.3  ALBUMIN 2.7* 3.3* 3.3* 3.5   Recent Labs    10/19/22 0339 11/15/22 0000 01/10/23 0000 01/10/23 1938 01/11/23 0500 01/16/23 0000 02/09/23 0000 07/25/23 0000  WBC 11.9*   < > 6.0 6.5 6.1 7.5 8.0 6.1  NEUTROABS  --    < > 3,168.00 3.6  --  4,770.00  --   --   HGB 11.5*   < > 10.6* 11.8* 10.8* 10.7* 11.4* 10.5*  HCT 37.1   < > 33* 39.4 35.3* 34* 36 34*  MCV 89.6  --   --  89.5 89.1  --   --   --   PLT 261   < > 244 272 230 225 345 230   < > = values in this interval not displayed.   Lab Results  Component Value Date   TSH 2.119 01/10/2023   No results found for: "HGBA1C" No results found for: "CHOL", "HDL", "LDLCALC", "LDLDIRECT", "TRIG", "CHOLHDL"  Significant Diagnostic Results in last 30 days:  No results found.  Assessment/Plan  Chronic anemia post op, s/p EGD no active bleed. S/p 6 u PRBC transfusion, ASA was dc'd, On Fe, Iron 15 in hospital, Hgb 10. 5  07/25/23  Essential hypertension Occasionally Bp runs low,  on Furosemide , off Spironolactone .   GERD (gastroesophageal reflux disease) stable, on  Pantoprazole , off  Omeprazole, prn Zofran . Hgb 10.5 07/25/23  Chronic heart failure with preserved ejection fraction (HFpEF) (HCC)  dependent  areas, resumed Furosemide , off Spironolactone , BNP 98 11/15/22,  06/01/23 pulmonology: prn Symbicort, albuterol , DuoNeb  CKD (chronic kidney disease) stage 4, GFR 15-29 ml/min (HCC) f/u Nephrology, Bun/creat 39/1.8 07/25/23 Repeat CBC/diff,  CMP/eGFR, TSH 09/19/23 prior to nephrology follow up  Hypothyroidism  TSH 2.119 01/10/23, on Levothyroxine .   Absence of hip joint, right Absent hip, s/p right THR 2018, removal of hardware 12/29/20, F/u Ortho. [facility]. Power wc. 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 Fully treated, then off antibiotics. 11/17/22 ID no infection, no tx, healed.   Candidiasis of skin In skin folds, continue Triamcinolone, Nystatin topical.    Family/ staff Communication: plan of care reviewed with the patient and charge nurse.   Labs/tests ordered:  CBC/diff, CMP/eGFR, TSH

## 2023-09-12 NOTE — Assessment & Plan Note (Signed)
 TSH 2.119 01/10/23, on Levothyroxine.

## 2023-09-12 NOTE — Assessment & Plan Note (Signed)
 stable, on  Pantoprazole , off  Omeprazole, prn Zofran . Hgb 10.5 07/25/23

## 2023-09-12 NOTE — Assessment & Plan Note (Signed)
 dependent  areas, resumed Furosemide, off Spironolactone, BNP 98 11/15/22,  06/01/23 pulmonology: prn Symbicort, albuterol, DuoNeb

## 2023-09-12 NOTE — Assessment & Plan Note (Signed)
 Occasionally Bp runs low,  on Furosemide , off Spironolactone .

## 2023-09-19 LAB — BASIC METABOLIC PANEL WITH GFR
BUN: 40 — AB (ref 4–21)
CO2: 19 (ref 13–22)
Chloride: 111 — AB (ref 99–108)
Creatinine: 1.8 — AB (ref 0.5–1.1)
Glucose: 113
Potassium: 4.6 meq/L (ref 3.5–5.1)
Sodium: 139 (ref 137–147)

## 2023-09-19 LAB — CBC AND DIFFERENTIAL
HCT: 36 (ref 36–46)
Hemoglobin: 11.1 — AB (ref 12.0–16.0)
Neutrophils Absolute: 2974
Platelets: 220 K/uL (ref 150–400)
WBC: 5.2

## 2023-09-19 LAB — HEPATIC FUNCTION PANEL
ALT: 9 U/L (ref 7–35)
AST: 12 — AB (ref 13–35)
Alkaline Phosphatase: 87 (ref 25–125)
Bilirubin, Total: 0.3

## 2023-09-19 LAB — CBC: RBC: 4 (ref 3.87–5.11)

## 2023-09-19 LAB — COMPREHENSIVE METABOLIC PANEL WITH GFR
Albumin: 3.5 (ref 3.5–5.0)
Calcium: 8.9 (ref 8.7–10.7)
Globulin: 2.8
eGFR: 26

## 2023-09-19 LAB — TSH: TSH: 4.68 (ref 0.41–5.90)

## 2023-09-28 LAB — CBC AND DIFFERENTIAL
HCT: 38 (ref 36–46)
Hemoglobin: 12.3 (ref 12.0–16.0)
PTH: 46
Platelets: 223 10*3/uL (ref 150–400)
WBC: 6.8

## 2023-09-28 LAB — BASIC METABOLIC PANEL WITH GFR
BUN: 37 — AB (ref 4–21)
CO2: 18 (ref 13–22)
Chloride: 110 — AB (ref 99–108)
Creatinine: 2.1 — AB (ref 0.5–1.1)
Glucose: 83
Potassium: 5.1 meq/L (ref 3.5–5.1)
Sodium: 139 (ref 137–147)

## 2023-09-28 LAB — VITAMIN D 25 HYDROXY (VIT D DEFICIENCY, FRACTURES): Vit D, 25-Hydroxy: 39.4

## 2023-09-28 LAB — CBC: RBC: 4.48 (ref 3.87–5.11)

## 2023-09-28 LAB — COMPREHENSIVE METABOLIC PANEL WITH GFR
Calcium: 9 (ref 8.7–10.7)
EGFR: 24

## 2023-09-28 LAB — PROTEIN / CREATININE RATIO, URINE: Albumin, U: 3.9

## 2023-10-06 ENCOUNTER — Encounter: Payer: Self-pay | Admitting: Sports Medicine

## 2023-10-06 ENCOUNTER — Non-Acute Institutional Stay (SKILLED_NURSING_FACILITY): Admitting: Sports Medicine

## 2023-10-06 DIAGNOSIS — K219 Gastro-esophageal reflux disease without esophagitis: Secondary | ICD-10-CM | POA: Diagnosis not present

## 2023-10-06 DIAGNOSIS — M159 Polyosteoarthritis, unspecified: Secondary | ICD-10-CM

## 2023-10-06 DIAGNOSIS — J449 Chronic obstructive pulmonary disease, unspecified: Secondary | ICD-10-CM

## 2023-10-06 DIAGNOSIS — R21 Rash and other nonspecific skin eruption: Secondary | ICD-10-CM | POA: Diagnosis not present

## 2023-10-06 DIAGNOSIS — N1832 Chronic kidney disease, stage 3b: Secondary | ICD-10-CM

## 2023-10-06 NOTE — Progress Notes (Signed)
 Provider:  Dr. Tye Gall Location:  [facility] Place of Service:   Skilled care Nursing   PCP: Tye Gall, MD Patient Care Team: Tye Gall, MD as PCP - General (Internal Medicine) Madison Riggers, MD as PCP - Cardiology (Cardiology) Jacqueline Matsu, MD as PCP - Sleep Medicine (Cardiology) Arloa Berg., MD (Sports Medicine) Patsey Booth, MD (Ophthalmology) [name], Earla Glassman, MD (Dermatology) Lanita Pitman, MD (Gastroenterology) [name], [name], MD (Nephrology) Suzi Essex, DC as Referring Physician (Chiropractic Medicine)  Extended Emergency Contact Information Primary Emergency Contact: Sliwinski,[name] Address: [address]          [place], [place] [address] [place] Home Phone: [phone] Mobile Phone: [phone] Relation: Spouse  Goals of Care: Advanced Directive information    08/15/2023   11:22 AM  Advanced Directives  Does Patient Have a Medical Advance Directive? Yes  Type of Advance Directive Living will;Out of facility DNR (pink MOST or yellow form)  Does patient want to make changes to medical advance directive? No - Patient declined  Pre-existing out of facility DNR order (yellow form or pink MOST form) Yellow form placed in chart (order not valid for inpatient use)       History of Present Illness   83 year old female with a past medical history of CKD, osteoarthritis, hypertension, OSA on CPAP, insomnia, depression, GERD, CHF, hypothyroidism is evaluated for acute visit for groin rash and weight change. Patient seen and examined in her room.  She is laying on her bed.  She seems pleasant and comfortable and does not appear to be in distress. She is nonambulatory and uses a power scooter for ambulation.  She sits most of the time in the scooter and lays on her bed in the late evening. Patient complains of itching in her private area since few days. Patient denies  abdominal pain, nausea, vomiting, dysuria, hematuria.  History of hypothyroidism-currently on levothyroxine  Osteoarthritis-patient reports that her pain is stable.  She is currently on tramadol as needed  GERD-denies heartburn, acid reflux.  She is currently on pantoprazole .   Past Medical History:  Diagnosis Date   Benign hypertension with chronic kidney disease, stage III (HCC)    Overview:  Last Assessment & Plan:  Usually the patient is hypertensive. However today her blood pressure is 105 systolic. She has been feeling fatigued with some lightheadedness. Part of this may be related to her lower blood pressure. Her pressure may be improved with her decrease in salt and fluid intake. I've instructed her to put her lisinopril/hctz on hold. 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 It was possibly reentrant.  She does well with diltiazem . No change in therapy.   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 She has never been exposed to tobacco smoke. She has never used smokeless tobacco. She reports that she does not drink alcohol and does not use drugs. Social History   Socioeconomic History   Marital status: Married    Spouse name: Not on file   Number of children: Not on file   Years of education: Not on file   Highest education level: Not on file  Occupational History   Not on file  Tobacco Use   Smoking status: Former    Passive exposure: Never   Smokeless tobacco: Never  Vaping Use   Vaping status: Never Used  Substance and Sexual Activity   Alcohol use: No   Drug use: No   Sexual activity: Not Currently  Other Topics Concern   Not on file  Social History Narrative   Not on file   Social Drivers of Health   Financial Resource Strain: Low Risk  (01/16/2021)   Received from [facility]   Overall Financial Resource Strain (CARDIA)    Difficulty of Paying Living Expenses: Not hard at all  Food Insecurity: No Food Insecurity (01/11/2023)   Hunger Vital Sign    Worried About Running Out of Food in the Last Year: Never true    Ran Out of Food in the Last Year: Never true  Transportation Needs: No Transportation Needs (01/11/2023)   PRAPARE - Administrator, Civil Service (Medical): No    Lack of Transportation (Non-Medical): No  Physical Activity: Not on file  Stress: No Stress Concern Present (01/13/2021)   Received [facility] - Occupational Stress Questionnaire    Feeling of Stress : Not at all  Social Connections: Unknown (08/28/2021)   Received from [facility]    Social Network    Social Network: Not on file  Intimate Partner Violence: Not At Risk (01/11/2023)   Humiliation, Afraid, Rape, and Kick questionnaire    Fear of Current or Ex-Partner: No    Emotionally Abused: No    Physically Abused: No    Sexually Abused: No    Functional Status Survey:    Family History  Problem Relation Age of Onset   High blood pressure Mother    Diabetes Mother    Heart Problems Father    Diabetes Father    Prostate cancer Father    Heart attack Father    Heart attack Paternal Grandmother    Heart attack Maternal Grandfather    Heart attack Paternal Uncle    Stroke Neg Hx     Health Maintenance  Topic Date Due   COVID-19 Vaccine (8 - 2024-25 season) 04/25/2024 (Originally 06/08/2023)   INFLUENZA VACCINE  11/24/2023   Medicare Annual Wellness (  AWV)  02/20/2024   DTaP/Tdap/Td (6 - Td or Tdap) 11/25/2030   Pneumococcal Vaccine: 50+ Years  Completed   DEXA SCAN  Completed   Zoster Vaccines- Shingrix  Completed   HPV VACCINES  Aged Out   Meningococcal B Vaccine  Aged Out    Allergies  Allergen Reactions   Ciprofloxacin     Keflex [Cephalexin] Diarrhea   Macrobid [Nitrofurantoin] Hives   Amoxicillin Rash   Cefadroxil  Rash    Outpatient Encounter Medications as of 10/06/2023  Medication Sig   acetaminophen  (TYLENOL ) 325 MG tablet Take 325 mg by mouth every 4 (four) hours as needed. 2 tablet by mouth every 4 hours as needed for fever and pain.   albuterol  (VENTOLIN  HFA) 108 (90 Base) MCG/ACT inhaler Inhale 2 puffs into the lungs every 6 (six) hours as needed for wheezing or shortness of breath.   bifidobacterium infantis (ALIGN) capsule Take 1 capsule by mouth daily.   budesonide -formoterol (SYMBICORT) 160-4.5 MCG/ACT inhaler Inhale 2 puffs into the lungs daily.   Calcium Carb-Cholecalciferol  (CALCIUM PLUS VITAMIN D3 PO) Take 1 tablet by mouth daily. Vitamin D3 20mcg + Calcium 600   Cholecalciferol  (VITAMIN D3) 50 MCG (2000 UT) TABS Take 2 tablets  by mouth daily.   Cranberry 500 MG TABS Take 500 mg by mouth in the morning and at bedtime.   docusate sodium  (COLACE) 100 MG capsule Take 100 mg by mouth daily. Give 100 mg by mouth every 24 hours as needed for constipation   ferrous sulfate  325 (65 FE) MG EC tablet Take 325 mg by mouth as directed. Once A Day on Mon, Thu   furosemide  (LASIX ) 20 MG tablet Take 20 mg by mouth daily.   guaiFENesin  200 MG/10ML LIQD Take 10 mLs by mouth every 4 (four) hours as needed.   hydrocortisone cream 1 % Apply 1 Application topically at bedtime. Apply to urogenital area topically at bedtime every other day for itchy areas related to vaginitis.   ipratropium-albuterol  (DUONEB) 0.5-2.5 (3) MG/3ML SOLN Inhale 3 mLs into the lungs every 8 (eight) hours as needed (wheezing).   levothyroxine  (SYNTHROID ) 25 MCG tablet Take 25 mcg by mouth as directed. Take 1 tablet (25 mcg) along with 75 mcg=100 mcg   levothyroxine  (SYNTHROID ) 75 MCG tablet Take 75 mcg by mouth as directed. Take 1 tablet (75 mcg) along with 25 mcg=100 mcg   loperamide (IMODIUM) 2 MG capsule Take 2 mg by mouth every 4 (four) hours as needed for diarrhea or loose stools.   loratadine (CLARITIN) 10 MG tablet Take 10 mg by mouth daily.   nystatin (MYCOSTATIN/NYSTOP) powder Apply 1 application  topically in the morning, at noon, in the evening, and at bedtime. To genital areas and inner thigh   nystatin cream (MYCOSTATIN) Apply 1 Application topically at bedtime.   ondansetron  (ZOFRAN ) 4 MG tablet Take 4 mg by mouth every 8 (eight) hours as needed for nausea or vomiting.   pantoprazole  (PROTONIX ) 40 MG tablet Take 40 mg by mouth daily.   potassium chloride  (KLOR-CON  M) 10 MEQ tablet Take 3 tablets by mouth daily.   potassium chloride  (MICRO-K ) 10 MEQ CR capsule Take 10 mEq by mouth once. (Patient not taking: Reported on 08/15/2023)   traMADol (ULTRAM) 50 MG tablet Take 50 mg by mouth every 6 (six) hours as needed.   triamcinolone cream (KENALOG) 0.5 %  Apply 1 Application topically at bedtime.   No facility-administered encounter medications on file as of 10/06/2023.    Review of Systems Negative  unless indicated in HPI.  There were no vitals filed for this visit. There is no height or weight on file to calculate BMI. BP Readings from Last 3 Encounters:  09/12/23 (!) 124/58  08/17/23 (!) 146/66  07/26/23 138/61   Wt Readings from Last 3 Encounters:  09/12/23 296 lb 14.4 oz (134.7 kg)  08/15/23 289 lb (131.1 kg)  07/26/23 289 lb (131.1 kg)   Physical Exam Constitutional:      Appearance: Normal appearance.  HENT:     Head: Normocephalic and atraumatic.   Cardiovascular:     Rate and Rhythm: Normal rate and regular rhythm.  Pulmonary:     Effort: Pulmonary effort is normal. No respiratory distress.     Breath sounds: Normal breath sounds. No wheezing.  Abdominal:     General: Bowel sounds are normal. There is no distension.     Tenderness: There is no abdominal tenderness. There is no guarding or rebound.     Comments:     Skin:    Comments: Bilateral groin rash and medial thigh     Neurological:     Mental Status: She is alert. Mental status is at baseline.     Motor: No weakness.     Labs reviewed: Basic Metabolic Panel: Recent Labs    10/19/22 0339 10/25/22 0000 01/10/23 1938 01/11/23 0500 01/16/23 0000 02/17/23 0000 07/25/23 0000 09/28/23 1316  NA 134*   < > 135 136   < > 139 139 139  K 3.7   < > 3.8 3.4*   < > 4.4 4.8 5.1  CL 107   < > 107 108   < > 110* 111* 110*  CO2 21*   < > 21* 21*   < > 20 21 18   GLUCOSE 88  --  105* 87  --   --   --   --   BUN 42*   < > 31* 29*   < > 41* 39* 37*  CREATININE 2.04*   < > 2.10* 1.95*   < > 2.1* 1.8* 2.1*  CALCIUM 7.9*   < > 8.7* 8.4*   < > 8.8 8.1* 9.0   < > = values in this interval not displayed.   Liver Function Tests: Recent Labs    10/13/22 1855 11/15/22 0000 01/10/23 0000 01/10/23 1938  AST 20 19 15 21   ALT 17 12 10 17   ALKPHOS 85 84 83 87   BILITOT 1.1  --   --  0.6  PROT 7.0  --   --  7.3  ALBUMIN 2.7* 3.3* 3.3* 3.5   Recent Labs    01/10/23 1938  LIPASE 24   No results for input(s): AMMONIA in the last 8760 hours. CBC: Recent Labs    10/19/22 0339 11/15/22 0000 01/10/23 0000 01/10/23 1938 01/11/23 0500 01/16/23 0000 02/09/23 0000 07/25/23 0000 09/28/23 1259  WBC 11.9*   < > 6.0 6.5 6.1 7.5 8.0 6.1 6.8  NEUTROABS  --    < > 3,168.00 3.6  --  4,770.00  --   --   --   HGB 11.5*   < > 10.6* 11.8* 10.8* 10.7* 11.4* 10.5* 12.3  HCT 37.1   < > 33* 39.4 35.3* 34* 36 34* 38  MCV 89.6  --   --  89.5 89.1  --   --   --   --   PLT 261   < > 244 272 230 225 345 230 223   < > =  values in this interval not displayed.   Cardiac Enzymes: No results for input(s): CKTOTAL, CKMB, CKMBINDEX, TROPONINI in the last 8760 hours. BNP: Invalid input(s): POCBNP No results found for: HGBA1C Lab Results  Component Value Date   TSH 2.119 01/10/2023   Lab Results  Component Value Date   VITAMINB12 738 07/04/2022   Lab Results  Component Value Date   FOLATE 11.3 07/04/2022   Lab Results  Component Value Date   IRON 18 (L) 07/09/2022   TIBC 155 (L) 07/09/2022   FERRITIN 772 (H) 07/09/2022    Imaging and Procedures obtained prior to SNF admission: DG FLUORO GUIDED NEEDLE PLC ASPIRATION/INJECTION LOC Result Date: 07/10/2023 CLINICAL DATA:  Right hip pain. History of right hip arthroplasty and subsequent explantation. Evaluation for infection EXAM: RIGHT HIP ASPIRATION UNDER FLUOROSCOPY COMPARISON:  CT abdomen and pelvis 01/10/2023 FLUOROSCOPY: Radiation Exposure Index (as provided by the fluoroscopic device): 3.70 mGy Kerma PROCEDURE: The overlying skin was prepped with Betadine, draped in the usual sterile fashion, and infiltrated locally with 1% lidocaine. A 3.5 inch 20 gauge spinal needle was advanced to the lateral aspect of the residual proximal right femoral diaphysis which lies within the acetabular fossa.  Aspiration yielded 33 mL of serosanguineous fluid which was sent for the requested laboratory studies. The needle was removed and a sterile dressing was applied. There was no immediate complication. IMPRESSION: Technically successful right hip aspiration under fluoroscopy. Electronically Signed   By: Aundra Lee M.D.   On: 07/10/2023 12:08    Assessment and Plan Assessment & Plan  Groin rash Patient complains of vaginal itching and rash in bilateral groin since few days She is currently using triamcinolone and nystatin with no relief. No drainage noted Will give one-time dose of fluconazole and start ketoconazole cream If no improvement we will refer to dermatology  History of COPD Lungs-no wheezing Continue with Symbicort  GERD Stable continue with pantoprazole   Osteoarthritis-stable Continue with tramadol as needed  CKD CR 1.87 Avoid nephrotoxic medications.     30 MINTotal time spent for obtaining history,  performing a medically appropriate examination and evaluation, reviewing the tests,  documenting clinical information in the electronic or other health record, I  ,care coordination (not separately reported)

## 2023-10-13 ENCOUNTER — Encounter: Payer: Self-pay | Admitting: Sports Medicine

## 2023-10-13 ENCOUNTER — Non-Acute Institutional Stay (SKILLED_NURSING_FACILITY): Payer: Self-pay | Admitting: Sports Medicine

## 2023-10-13 DIAGNOSIS — I5032 Chronic diastolic (congestive) heart failure: Secondary | ICD-10-CM | POA: Diagnosis not present

## 2023-10-13 DIAGNOSIS — E039 Hypothyroidism, unspecified: Secondary | ICD-10-CM | POA: Diagnosis not present

## 2023-10-13 DIAGNOSIS — M159 Polyosteoarthritis, unspecified: Secondary | ICD-10-CM

## 2023-10-13 DIAGNOSIS — K2100 Gastro-esophageal reflux disease with esophagitis, without bleeding: Secondary | ICD-10-CM

## 2023-10-13 DIAGNOSIS — E876 Hypokalemia: Secondary | ICD-10-CM

## 2023-10-13 DIAGNOSIS — N1832 Chronic kidney disease, stage 3b: Secondary | ICD-10-CM | POA: Diagnosis not present

## 2023-10-13 DIAGNOSIS — J302 Other seasonal allergic rhinitis: Secondary | ICD-10-CM

## 2023-10-13 NOTE — Progress Notes (Signed)
 Provider:  Dr. Tye Gall Location:  [facility] Place of Service:   skilled care nursing   PCP: Tye Gall, MD Patient Care Team: Tye Gall, MD as PCP - General (Internal Medicine) Liza Riggers, MD as PCP - Cardiology (Cardiology) Jacqueline Matsu, MD as PCP - Sleep Medicine (Cardiology) Arloa Berg., MD (Sports Medicine) Patsey Booth, MD (Ophthalmology) [name], Earla Glassman, MD (Dermatology) Lanita Pitman, MD (Gastroenterology) [name], [name], MD (Nephrology) Suzi Essex, DC as Referring Physician (Chiropractic Medicine)  Extended Emergency Contact Information Primary Emergency Contact: [facility]: Lambert Rhonda W Address: [address]          [place], [place] [address] [place] Home Phone: [phone] Mobile Phone: [phone] Relation: Spouse  Goals of Care: Advanced Directive information    08/15/2023   11:22 AM  Advanced Directives  Does Patient Have a Medical Advance Directive? Yes  Type of Advance Directive Living will;Out of facility DNR (pink MOST or yellow form)  Does patient want to make changes to medical advance directive? No - Patient declined  Pre-existing out of facility DNR order (yellow form or pink MOST form) Yellow form placed in chart (order not valid for inpatient use)       History of Present Illness  83 year old female with a past medical history of CKD, osteoarthritis, hypertension, OSA on CPAP, insomnia, depression, GERD, CHF, hypothyroidism is evaluated for the chronic disease management. Patient seen and examined in her room.  She ambulates with a scooter.  She visits her husband and eat lunch with him every day.  She enjoys reading books. Seems pleasant and calm portable and does not appear to be in distress. Patient denies chest pain, shortness of breath, abdominal pain, nausea, vomiting, dysuria, hematuria, bloody or dark-colored stools. He was recently  evaluated last week for groin rash.  Staff reported the rash is improving.      Past Medical History:  Diagnosis Date   Benign hypertension with chronic kidney disease, stage III (HCC)    Overview:  Last Assessment & Plan:  Usually the patient is hypertensive. However today her blood pressure is 105 systolic. She has been feeling fatigued with some lightheadedness. Part of this may be related to her lower blood pressure. Her pressure may be improved with her decrease in salt and fluid intake. I've instructed her to put her lisinopril/hctz on hold. 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 It was possibly reentrant. She does well with diltiazem . No change in therapy.   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 She has never been exposed to tobacco smoke. She has never used smokeless tobacco. She reports that she does not drink alcohol and does not use drugs. Social History   Socioeconomic History   Marital status: Married    Spouse name: Not on file   Number of children: Not on file   Years of education: Not on file   Highest education level: Not on file  Occupational History   Not on file  Tobacco Use   Smoking status: Former    Passive exposure: Never   Smokeless tobacco: Never  Vaping Use   Vaping status: Never Used  Substance and Sexual Activity   Alcohol use: No   Drug use: No   Sexual activity: Not Currently  Other Topics Concern   Not on file  Social History Narrative   Not on file   Social Drivers of Health   Financial Resource Strain: Low Risk  (01/16/2021)   Received from [facility]   Overall Financial Resource Strain (CARDIA)    Difficulty of Paying Living Expenses: Not hard at all  Food Insecurity: No Food Insecurity (01/11/2023)   Hunger Vital Sign    Worried About Running Out of Food in the Last Year: Never true    Ran Out of Food in the Last Year: Never true  Transportation Needs: No Transportation Needs (01/11/2023)   PRAPARE - Administrator, Civil Service (Medical): No    Lack of Transportation (Non-Medical): No  Physical Activity: Not on file  Stress: No Stress Concern Present (01/13/2021)   Received from [facility] - Occupational Stress Questionnaire    Feeling of Stress : Not at all  Social Connections: Unknown (08/28/2021)   Received from [facility]   Social Network    Social Network: Not on file  Intimate Partner Violence: Not At Risk (01/11/2023)   Humiliation, Afraid, Rape, and Kick questionnaire    Fear of Current or Ex-Partner: No     Emotionally Abused: No    Physically Abused: No    Sexually Abused: No    Functional Status Survey:    Family History  Problem Relation Age of Onset   High blood pressure Mother    Diabetes Mother    Heart Problems Father    Diabetes Father    Prostate cancer Father    Heart attack Father    Heart attack Paternal Grandmother    Heart attack Maternal Grandfather    Heart attack Paternal Uncle    Stroke Neg Hx     Health Maintenance  Topic Date Due   COVID-19 Vaccine (8 - 2024-25 season) 04/25/2024 (Originally 06/08/2023)   INFLUENZA VACCINE  11/24/2023   Medicare Annual Wellness (AWV)  02/20/2024   DTaP/Tdap/Td (6 - Td or Tdap) 11/25/2030   Pneumococcal Vaccine: 50+ Years  Completed   DEXA SCAN  Completed   Zoster Vaccines- Shingrix  Completed  HPV VACCINES  Aged Out   Meningococcal B Vaccine  Aged Out    Allergies  Allergen Reactions   Ciprofloxacin     Keflex [Cephalexin] Diarrhea   Macrobid [Nitrofurantoin] Hives   Amoxicillin Rash   Cefadroxil  Rash    Outpatient Encounter Medications as of 10/13/2023  Medication Sig   acetaminophen  (TYLENOL ) 325 MG tablet Take 325 mg by mouth every 4 (four) hours as needed. 2 tablet by mouth every 4 hours as needed for fever and pain.   albuterol  (VENTOLIN  HFA) 108 (90 Base) MCG/ACT inhaler Inhale 2 puffs into the lungs every 6 (six) hours as needed for wheezing or shortness of breath.   bifidobacterium infantis (ALIGN) capsule Take 1 capsule by mouth daily.   budesonide -formoterol (SYMBICORT) 160-4.5 MCG/ACT inhaler Inhale 2 puffs into the lungs daily.   Calcium Carb-Cholecalciferol  (CALCIUM PLUS VITAMIN D3 PO) Take 1 tablet by mouth daily. Vitamin D3 20mcg + Calcium 600   Cholecalciferol  (VITAMIN D3) 50 MCG (2000 UT) TABS Take 2 tablets by mouth daily.   Cranberry 500 MG TABS Take 500 mg by mouth in the morning and at bedtime.   docusate sodium  (COLACE) 100 MG capsule Take 100 mg by mouth daily. Give 100 mg by mouth every 24  hours as needed for constipation   ferrous sulfate  325 (65 FE) MG EC tablet Take 325 mg by mouth as directed. Once A Day on Mon, Thu   furosemide  (LASIX ) 20 MG tablet Take 20 mg by mouth daily.   guaiFENesin  200 MG/10ML LIQD Take 10 mLs by mouth every 4 (four) hours as needed.   hydrocortisone cream 1 % Apply 1 Application topically at bedtime. Apply to urogenital area topically at bedtime every other day for itchy areas related to vaginitis.   ipratropium-albuterol  (DUONEB) 0.5-2.5 (3) MG/3ML SOLN Inhale 3 mLs into the lungs every 8 (eight) hours as needed (wheezing).   levothyroxine  (SYNTHROID ) 25 MCG tablet Take 25 mcg by mouth as directed. Take 1 tablet (25 mcg) along with 75 mcg=100 mcg   levothyroxine  (SYNTHROID ) 75 MCG tablet Take 75 mcg by mouth as directed. Take 1 tablet (75 mcg) along with 25 mcg=100 mcg   loperamide (IMODIUM) 2 MG capsule Take 2 mg by mouth every 4 (four) hours as needed for diarrhea or loose stools.   loratadine (CLARITIN) 10 MG tablet Take 10 mg by mouth daily.   nystatin (MYCOSTATIN/NYSTOP) powder Apply 1 application  topically in the morning, at noon, in the evening, and at bedtime. To genital areas and inner thigh   nystatin cream (MYCOSTATIN) Apply 1 Application topically at bedtime.   ondansetron  (ZOFRAN ) 4 MG tablet Take 4 mg by mouth every 8 (eight) hours as needed for nausea or vomiting.   pantoprazole  (PROTONIX ) 40 MG tablet Take 40 mg by mouth daily.   potassium chloride  (KLOR-CON  M) 10 MEQ tablet Take 3 tablets by mouth daily.   traMADol (ULTRAM) 50 MG tablet Take 50 mg by mouth every 6 (six) hours as needed.   triamcinolone cream (KENALOG) 0.5 % Apply 1 Application topically at bedtime.   No facility-administered encounter medications on file as of 10/13/2023.    Review of Systems  Constitutional:  Negative for fever.  Respiratory:  Negative for cough, shortness of breath and wheezing.   Cardiovascular:  Negative for chest pain and palpitations.   Gastrointestinal:  Negative for abdominal distention, abdominal pain, blood in stool, constipation, diarrhea, nausea and vomiting.  Genitourinary:  Negative for dysuria.  Neurological:  Negative for dizziness.  Psychiatric/Behavioral:  Negative for confusion.    Negative unless indicated in HPI.  There were no vitals filed for this visit. There is no height or weight on file to calculate BMI. BP Readings from Last 3 Encounters:  10/06/23 136/74  09/12/23 (!) 124/58  08/17/23 (!) 146/66   Wt Readings from Last 3 Encounters:  10/06/23 289 lb 8 oz (131.3 kg)  09/12/23 296 lb 14.4 oz (134.7 kg)  08/15/23 289 lb (131.1 kg)   Physical Exam Constitutional:      Appearance: Normal appearance.  HENT:     Head: Normocephalic and atraumatic.   Cardiovascular:     Rate and Rhythm: Normal rate and regular rhythm.  Pulmonary:     Effort: Pulmonary effort is normal. No respiratory distress.     Breath sounds: Normal breath sounds. No wheezing.  Abdominal:     General: Bowel sounds are normal. There is no distension.     Tenderness: There is no abdominal tenderness. There is no guarding or rebound.     Comments:     Musculoskeletal:        General: No swelling.   Skin:    General: Skin is dry.   Neurological:     Mental Status: She is alert. Mental status is at baseline.     Labs reviewed: Basic Metabolic Panel: Recent Labs    10/19/22 0339 10/25/22 0000 01/10/23 1938 01/11/23 0500 01/16/23 0000 02/17/23 0000 07/25/23 0000 09/28/23 1316  NA 134*   < > 135 136   < > 139 139 139  K 3.7   < > 3.8 3.4*   < > 4.4 4.8 5.1  CL 107   < > 107 108   < > 110* 111* 110*  CO2 21*   < > 21* 21*   < > 20 21 18   GLUCOSE 88  --  105* 87  --   --   --   --   BUN 42*   < > 31* 29*   < > 41* 39* 37*  CREATININE 2.04*   < > 2.10* 1.95*   < > 2.1* 1.8* 2.1*  CALCIUM 7.9*   < > 8.7* 8.4*   < > 8.8 8.1* 9.0   < > = values in this interval not displayed.   Liver Function Tests: Recent  Labs    10/13/22 1855 11/15/22 0000 01/10/23 0000 01/10/23 1938  AST 20 19 15 21   ALT 17 12 10 17   ALKPHOS 85 84 83 87  BILITOT 1.1  --   --  0.6  PROT 7.0  --   --  7.3  ALBUMIN 2.7* 3.3* 3.3* 3.5   Recent Labs    01/10/23 1938  LIPASE 24   No results for input(s): AMMONIA in the last 8760 hours. CBC: Recent Labs    10/19/22 0339 11/15/22 0000 01/10/23 0000 01/10/23 1938 01/11/23 0500 01/16/23 0000 02/09/23 0000 07/25/23 0000 09/28/23 1259  WBC 11.9*   < > 6.0 6.5 6.1 7.5 8.0 6.1 6.8  NEUTROABS  --    < > 3,168.00 3.6  --  4,770.00  --   --   --   HGB 11.5*   < > 10.6* 11.8* 10.8* 10.7* 11.4* 10.5* 12.3  HCT 37.1   < > 33* 39.4 35.3* 34* 36 34* 38  MCV 89.6  --   --  89.5 89.1  --   --   --   --   PLT 261   < >  244 272 230 225 345 230 223   < > = values in this interval not displayed.   Cardiac Enzymes: No results for input(s): CKTOTAL, CKMB, CKMBINDEX, TROPONINI in the last 8760 hours. BNP: Invalid input(s): POCBNP No results found for: HGBA1C Lab Results  Component Value Date   TSH 2.119 01/10/2023   Lab Results  Component Value Date   VITAMINB12 738 07/04/2022   Lab Results  Component Value Date   FOLATE 11.3 07/04/2022   Lab Results  Component Value Date   IRON 18 (L) 07/09/2022   TIBC 155 (L) 07/09/2022   FERRITIN 772 (H) 07/09/2022    Imaging and Procedures obtained prior to SNF admission: DG FLUORO GUIDED NEEDLE PLC ASPIRATION/INJECTION LOC Result Date: 07/10/2023 CLINICAL DATA:  Right hip pain. History of right hip arthroplasty and subsequent explantation. Evaluation for infection EXAM: RIGHT HIP ASPIRATION UNDER FLUOROSCOPY COMPARISON:  CT abdomen and pelvis 01/10/2023 FLUOROSCOPY: Radiation Exposure Index (as provided by the fluoroscopic device): 3.70 mGy Kerma PROCEDURE: The overlying skin was prepped with Betadine, draped in the usual sterile fashion, and infiltrated locally with 1% lidocaine. A 3.5 inch 20 gauge spinal  needle was advanced to the lateral aspect of the residual proximal right femoral diaphysis which lies within the acetabular fossa. Aspiration yielded 33 mL of serosanguineous fluid which was sent for the requested laboratory studies. The needle was removed and a sterile dressing was applied. There was no immediate complication. IMPRESSION: Technically successful right hip aspiration under fluoroscopy. Electronically Signed   By: Aundra Lee M.D.   On: 07/10/2023 12:08    Assessment and Plan Assessment & Plan  CKD  Creatinine 1.87 Avoid nephrotoxic medication Increase oral hydration Will check labs in 2 months   CHF Euvolemic on exam O2 sat 98% on room air Lungs clear to auscultation Continue with Lasix  20 mg daily Avoid salty foods   History of GERD Denies heartburn, acid reflux Continue with pantoprazole  40 mg daily   Osteoarthritis Patient denied pain during the visit today Continue with tramadol 50 mg every 6 as needed  History of anemia Hemoglobin 11.1 No signs of bleeding Continue with iron supplements   Seasonal allergies Continue the Claritin.      30 min Total time spent for obtaining history,  performing a medically appropriate examination and evaluation, reviewing the tests,  documenting clinical information in the electronic or other health record,care coordination (not separately reported)

## 2023-10-16 ENCOUNTER — Encounter: Payer: Self-pay | Admitting: Sports Medicine

## 2023-10-16 NOTE — Progress Notes (Signed)
 This encounter was created in error - please disregard.

## 2023-11-13 ENCOUNTER — Encounter: Payer: Self-pay | Admitting: Nurse Practitioner

## 2023-11-13 ENCOUNTER — Non-Acute Institutional Stay (SKILLED_NURSING_FACILITY): Payer: Self-pay | Admitting: Nurse Practitioner

## 2023-11-13 DIAGNOSIS — D500 Iron deficiency anemia secondary to blood loss (chronic): Secondary | ICD-10-CM | POA: Diagnosis not present

## 2023-11-13 DIAGNOSIS — I5032 Chronic diastolic (congestive) heart failure: Secondary | ICD-10-CM | POA: Diagnosis not present

## 2023-11-13 DIAGNOSIS — N184 Chronic kidney disease, stage 4 (severe): Secondary | ICD-10-CM

## 2023-11-13 DIAGNOSIS — K2100 Gastro-esophageal reflux disease with esophagitis, without bleeding: Secondary | ICD-10-CM | POA: Diagnosis not present

## 2023-11-13 DIAGNOSIS — I10 Essential (primary) hypertension: Secondary | ICD-10-CM

## 2023-11-13 DIAGNOSIS — E039 Hypothyroidism, unspecified: Secondary | ICD-10-CM

## 2023-11-13 NOTE — Progress Notes (Unsigned)
 Location:  [facility] Room Number: [id number] A Place of Service:  SNF (31) Provider:  [facility] Kazuko Clemence, N.P.   Patient Care Team: Sherlynn Madden, MD as PCP - General (Internal Medicine) [name], MD as PCP - Cardiology (Cardiology) [name], MD as PCP - Sleep Medicine (Cardiology) [name], MD (Sports Medicine) [name], MD (Ophthalmology) [name], Carlin Bullard, MD (Dermatology) [name], MD (Gastroenterology) [name], [name], MD (Nephrology) [name], DC as Referring Physician (Chiropractic Medicine)  Extended Emergency Contact Information Primary Emergency Contact: Whitener,[name] Address: [address]          [place], [place] [address] [place] Home Phone: [phone] Mobile Phone: [phone] Relation: Spouse  Code Status:  DNR Goals of care: Advanced Directive information    10/16/2023   12:48 PM  Advanced Directives  Does Patient Have a Medical Advance Directive? Yes  Type of Advance Directive Out of facility DNR (pink MOST or yellow form)     Chief Complaint  Patient presents with  . Medical Management of Chronic Issues    Routine visit     HPI:  Pt is a 83 y.o. female seen today for medical management of chronic diseases.     lower back pain, controlled, prn Tramadol and Tylenol  available to her, X-ray lumbar spine, sacrococcyx 06/08/23 negative fx.              Hospitalized 01/10/23-01/12/23 for UTI, diverticulitis, treated with Zosyn , Cefadroxil , Flagyl , developed pruritic rash, then ID changed to Cipro  and Flagyl -complete 01/19/23, last colonoscopy 2018. Has x1 Fosfomycin.               Hospitalized for AKI 10/13/22-10/19/22 for AKI             Hospitalized 07/08/22 for CHF, abscess of the right hip which previous infected, treated, and prosthesis was removed and never replaced.               Hospitalized 07/02/22-07/07/22 for the right hip cellulitis, CT hip in hospital  fluid collection unclear if it communicates with the joint space, Ortho not felt to be related to the joint space and may be an old fluid collection, aspiration not recommended. 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 TDWB. Power wc. 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 Fully treated, then off antibiotics. 11/17/22 ID no infection, no tx, healed.               Elevated AST/ALT normalized, S/p cholecystectomy, US  02/19/21 no cyst or mass.                    Anemia, post op, s/p EGD no active bleed. S/p 6 u PRBC transfusion, ASA was dc'd, On Fe, Iron 15 in hospital, Hgb 12.3 09/28/23             Muscle spasm, left lower back, resolved, off Gabapentin , failed Methocarbamol              HTN on Furosemide , off Spironolactone .              Morbid obesity  OSA CPAP             Chronic diarrhea, prn Imodium, Align             RA f/u Rheumatology, hx of Plaquenil use, stable presently.              Insomnia/depression/anxiety, stable, off Mirtazapine .              GERD, stable, on  Pantoprazole , off  Omeprazole, prn Zofran .             CHF/Edema, dependent  areas, resumed Furosemide , off Spironolactone , BNP 98 11/15/22,  06/01/23 pulmonology:  albuterol , DuoNeb CKD stage 4, f/u Nephrology, Bun/creat 37/2.1 09/28/23 Hyponatremia, Na 139 09/28/23             Hypothyroidism, TSH 4.68 09/19/23,  on Levothyroxine .                       Past Medical History:  Diagnosis Date  . Benign hypertension with chronic kidney disease, stage III (HCC)    Overview:  Last Assessment & Plan:  Usually the patient is hypertensive. However today her blood pressure is 105 systolic. She has been feeling fatigued with some lightheadedness. Part of this may be related to her lower blood pressure. Her  pressure may be improved with her decrease in salt and fluid intake. I've instructed her to put her lisinopril/hctz on hold. I've asked her to see her primary   . Cardiac disease 03/10/2014  . Chronic diarrhea 07/27/2015  . Edema 07/27/2015  . Ejection fraction 2004   Normal, echo,   . Gastroesophageal reflux disease   . GERD (gastroesophageal reflux disease)   . Heart disease 03/10/2014  . Hypertension   . Left bundle branch block 09/01/2020  . Obesity   . Obesity (BMI 30-39.9) 09/25/2017  . OSA (obstructive sleep apnea)    mild with AHI 6.75 - on CPAP  . Preop cardiovascular exam 11/2010   Cardiac clearance for knee surgery   . Sleep apnea 03/10/2014  . 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 It was possibly reentrant. She does well with diltiazem . No change in therapy.  . 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 Thyroid  disease   . Urinary incontinence    Past Surgical History:  Procedure Laterality Date  . CATARACT EXTRACTION Left 8/11  . CATARACT EXTRACTION Right 3/12  . CHOLECYSTECTOMY  1994  . Copsilotomy Laser Treatment Left 6/12   eye  . ELBOW SURGERY  8/12  . 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 EYE SURGERY Right 12/11  . Lumbar Infusion  2011  . MOLE REMOVAL  06/17/2015  . 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 TOTAL KNEE ARTHROPLASTY Right 06/13/2011    Allergies  Allergen Reactions  . Ciprofloxacin    . Keflex [Cephalexin] Diarrhea  . Macrobid [Nitrofurantoin] Hives  . Amoxicillin Rash  . Cefadroxil  Rash    Outpatient Encounter Medications as of 11/13/2023  Medication Sig  . acetaminophen  (TYLENOL ) 325 MG tablet Take 650 mg by  mouth every 4 (four) hours as needed. 2 tablet by mouth every 4 hours as needed for fever and pain.  . albuterol  (VENTOLIN  HFA)  108 (90 Base) MCG/ACT inhaler Inhale 2 puffs into the lungs every 6 (six) hours as needed for wheezing or shortness of breath.  . bifidobacterium infantis (ALIGN) capsule Take 1 capsule by mouth daily.  . Calcium Carb-Cholecalciferol  (CALCIUM PLUS VITAMIN D3 PO) Take 1 tablet by mouth daily. Vitamin D3 20mcg + Calcium 600  . Cholecalciferol  (VITAMIN D3) 50 MCG (2000 UT) TABS Take 2 tablets by mouth daily.  . Cranberry 500 MG TABS Take 500 mg by mouth in the morning and at bedtime.  . docusate sodium  (COLACE) 100 MG capsule Take 100 mg by mouth daily. Give 100 mg by mouth every 24 hours as needed for constipation  . ferrous sulfate  325 (65 FE) MG EC tablet Take 325 mg by mouth daily.  . furosemide  (LASIX ) 20 MG tablet Take 20 mg by mouth daily.  . guaiFENesin -dextromethorphan (ROBITUSSIN DM) 100-10 MG/5ML syrup Take 10 mLs by mouth every 4 (four) hours as needed for cough.  . hydrocortisone cream 1 % Apply 1 Application topically at bedtime. Apply to urogenital area topically at bedtime every other day for itchy areas related to vaginitis.  SABRA ipratropium-albuterol  (DUONEB) 0.5-2.5 (3) MG/3ML SOLN Inhale 3 mLs into the lungs every 6 (six) hours as needed (wheezing). And every 8 hours as needed per [facility]  . ketoconazole (NIZORAL) 2 % cream Apply 1 Application topically 2 (two) times daily.  . levothyroxine  (SYNTHROID ) 100 MCG tablet Take 100 mcg by mouth daily before breakfast.  . loperamide (IMODIUM) 2 MG capsule Take 2 mg by mouth every 4 (four) hours as needed for diarrhea or loose stools.  SABRA loratadine (CLARITIN) 10 MG tablet Take 10 mg by mouth daily.  SABRA nystatin (MYCOSTATIN/NYSTOP) powder Apply 1 application  topically in the morning, at noon, in the evening, and at bedtime. To genital areas and inner thigh  . ondansetron  (ZOFRAN ) 4 MG tablet Take 4 mg by  mouth every 8 (eight) hours as needed for nausea or vomiting.  . pantoprazole  (PROTONIX ) 40 MG tablet Take 40 mg by mouth daily.  . potassium chloride  (KLOR-CON  M) 10 MEQ tablet Take 3 tablets by mouth daily.  . traMADol (ULTRAM) 50 MG tablet Take 50 mg by mouth every 6 (six) hours as needed.  . triamcinolone cream (KENALOG) 0.5 % Apply 1 Application topically 2 (two) times daily.  . [DISCONTINUED] budesonide -formoterol (SYMBICORT) 160-4.5 MCG/ACT inhaler Inhale 2 puffs into the lungs daily. (Patient not taking: Reported on 11/13/2023)  . [DISCONTINUED] guaiFENesin  200 MG/10ML LIQD Take 10 mLs by mouth every 4 (four) hours as needed.  . [DISCONTINUED] levothyroxine  (SYNTHROID ) 25 MCG tablet Take 25 mcg by mouth as directed. Take 1 tablet (25 mcg) along with 75 mcg=100 mcg (Patient not taking: Reported on 11/13/2023)  . [DISCONTINUED] levothyroxine  (SYNTHROID ) 75 MCG tablet Take 75 mcg by mouth as directed. Take 1 tablet (75 mcg) along with 25 mcg=100 mcg (Patient not taking: Reported on 11/13/2023)  . [DISCONTINUED] nystatin cream (MYCOSTATIN) Apply 1 Application topically at bedtime. (Patient not taking: Reported on 11/13/2023)   No facility-administered encounter medications on file as of 11/13/2023.    Review of Systems  Constitutional:  Negative for appetite change, fatigue and fever.  HENT:  Negative for congestion, sore throat and trouble swallowing.   Eyes:  Negative for visual disturbance.  Respiratory:  Negative for cough, shortness of breath and wheezing.   Cardiovascular:  Positive for leg swelling.  Gastrointestinal:  Negative for constipation.  Genitourinary:  Positive for frequency. Negative for dysuria  and urgency.  Musculoskeletal:  Positive for arthralgias, back pain and gait problem.       Mid to lower back pain Lower back pain  Skin:  Negative for color change.  Neurological:  Negative for speech difficulty and weakness.  Psychiatric/Behavioral:  Negative for confusion and  sleep disturbance. The patient is not nervous/anxious.     Immunization History  Administered Date(s) Administered  . Fluzone Influenza virus vaccine,trivalent (IIV3), split virus 02/20/2012, 02/11/2013, 02/02/2016, 01/11/2017, 02/04/2019  . Influenza Split 01/28/2008, 02/17/2009  . Influenza, High Dose Seasonal PF 02/04/2015, 01/11/2017, 01/30/2018, 02/05/2020, 01/25/2023  . Influenza-Unspecified 02/16/2022  . Moderna Covid-19 Vaccine Bivalent Booster 79yrs & up 04/13/2023  . Moderna SARS-COV2 Booster Vaccination 02/24/2022  . Moderna Sars-Covid-2 Vaccination 04/29/2019, 05/09/2019, 05/27/2019, 03/03/2020, 09/04/2020, 11/04/2020  . Pfizer Covid-19 Vaccine Bivalent Booster 19yrs & up 02/09/2021  . Pneumococcal Conjugate-13 08/30/2013  . Pneumococcal Polysaccharide-23 11/03/2005  . RSV,unspecified 04/22/2022  . Td 08/01/2001, 09/05/2019  . Td (Adult) 08/01/2001  . Tdap 09/22/2010, 11/24/2020  . Zoster Recombinant(Shingrix) 11/08/2016, 01/11/2017  . Zoster, Live 11/04/2005, 11/08/2016, 01/11/2017  . Zoster, Unspecified 01/11/2017   Pertinent  Health Maintenance Due  Topic Date Due  . INFLUENZA VACCINE  11/24/2023  . DEXA SCAN  Completed      04/15/2022   10:15 AM 04/27/2022   11:35 AM 05/31/2022    9:34 AM 07/26/2022    9:31 AM 10/21/2022    9:31 AM  Fall Risk  Falls in the past year? 0 0 0 0 0  Was there an injury with Fall? 0 0 0 0 0  Fall Risk Category Calculator 0 0 0 0 0  Fall Risk Category (Retired) Low  Low      (RETIRED) Patient Fall Risk Level Moderate fall risk  Moderate fall risk      Patient at Risk for Falls Due to History of fall(s) History of fall(s) History of fall(s)  No Fall Risks  Fall risk Follow up Falls evaluation completed  Falls evaluation completed  Falls evaluation completed  Falls evaluation completed     Data saved with a previous flowsheet row definition   Functional Status Survey:    Vitals:   11/13/23 1010 11/13/23 1029  BP: (!) 146/55 (!)  135/55  Pulse: (!) 55   SpO2: 98%   Weight: 289 lb (131.1 kg)   Height: 5' 8 (1.727 m)    Body mass index is 43.94 kg/m. Physical Exam Vitals and nursing note reviewed.  Constitutional:      Appearance: Normal appearance. She is obese.  HENT:     Head: Normocephalic and atraumatic.     Nose: Nose normal.     Mouth/Throat:     Mouth: Mucous membranes are moist.  Eyes:     Extraocular Movements: Extraocular movements intact.     Conjunctiva/sclera: Conjunctivae normal.     Pupils: Pupils are equal, round, and reactive to light.  Cardiovascular:     Rate and Rhythm: Normal rate and regular rhythm.     Heart sounds: No murmur heard. Pulmonary:     Effort: Pulmonary effort is normal.     Breath sounds: No rales.  Abdominal:     General: Bowel sounds are normal.     Palpations: Abdomen is soft.     Tenderness: There is no abdominal tenderness.  Musculoskeletal:        General: No tenderness.     Cervical back: Normal range of motion and neck supple.     Right  lower leg: Edema present.     Left lower leg: Edema present.     Comments: R hip, prosthetic hip removed. Dependent edema moderate. Lower back pain is controlled.   Skin:    General: Skin is warm and dry.     Comments: Right hip surgical scar.  Edema mostly in the dependent areas, buttock, thighs, legs.   Neurological:     General: No focal deficit present.     Mental Status: She is alert and oriented to person, place, and time.     Gait: Gait abnormal.  Psychiatric:        Mood and Affect: Mood normal.        Behavior: Behavior normal.        Thought Content: Thought content normal.        Judgment: Judgment normal.     Labs reviewed: Recent Labs    01/10/23 1938 01/11/23 0500 01/16/23 0000 07/25/23 0000 09/19/23 0000 09/28/23 1316  NA 135 136   < > 139 139 139  K 3.8 3.4*   < > 4.8 4.6 5.1  CL 107 108   < > 111* 111* 110*  CO2 21* 21*   < > 21 19 18   GLUCOSE 105* 87  --   --   --   --   BUN 31* 29*    < > 39* 40* 37*  CREATININE 2.10* 1.95*   < > 1.8* 1.8* 2.1*  CALCIUM 8.7* 8.4*   < > 8.1* 8.9 9.0   < > = values in this interval not displayed.   Recent Labs    01/10/23 0000 01/10/23 1938 09/19/23 0000  AST 15 21 12*  ALT 10 17 9   ALKPHOS 83 87 87  BILITOT  --  0.6  --   PROT  --  7.3  --   ALBUMIN 3.3* 3.5 3.5   Recent Labs    01/10/23 1938 01/11/23 0500 01/16/23 0000 02/09/23 0000 08/24/23 0000 09/19/23 0000 09/28/23 1259  WBC 6.5 6.1 7.5   < > 5.9 5.2 6.8  NEUTROABS 3.6  --  4,770.00  --   --  2,974.00  --   HGB 11.8* 10.8* 10.7*   < > 10.6* 11.1* 12.3  HCT 39.4 35.3* 34*   < > 34* 36 38  MCV 89.5 89.1  --   --   --   --   --   PLT 272 230 225   < > 240 220 223   < > = values in this interval not displayed.   Lab Results  Component Value Date   TSH 4.68 09/19/2023   No results found for: HGBA1C No results found for: CHOL, HDL, LDLCALC, LDLDIRECT, TRIG, CHOLHDL  Significant Diagnostic Results in last 30 days:  No results found.  Assessment/Plan Blood loss anemia  post op, s/p EGD no active bleed. S/p 6 u PRBC transfusion, ASA was dc'd, On Fe, Iron 15 in hospital, Hgb 12.3 09/28/23  Essential hypertension Blood pressure is controlled, on Furosemide , off Spironolactone .   GERD (gastroesophageal reflux disease) stable, on  Pantoprazole , off  Omeprazole, prn Zofran .  Chronic heart failure with preserved ejection fraction (HFpEF) (HCC)  dependent  areas, resumed Furosemide , off Spironolactone , BNP 98 11/15/22,  06/01/23 pulmonology:  albuterol , DuoNeb  CKD (chronic kidney disease) stage 4, GFR 15-29 ml/min (HCC)  f/u Nephrology, Bun/creat 37/2.1 09/28/23  Hypothyroidism  TSH 4.68 09/19/23,  on Levothyroxine .      Family/ staff Communication: plan of care  reviewed with the patient and charge nurse.   Labs/tests ordered: none

## 2023-11-13 NOTE — Assessment & Plan Note (Signed)
 post op, s/p EGD no active bleed. S/p 6 u PRBC transfusion, ASA was dc'd, On Fe, Iron 15 in hospital, Hgb 12.3 09/28/23

## 2023-11-13 NOTE — Assessment & Plan Note (Signed)
 dependent  areas, resumed Furosemide , off Spironolactone , BNP 98 11/15/22,  06/01/23 pulmonology:  albuterol , DuoNeb

## 2023-11-13 NOTE — Assessment & Plan Note (Signed)
 f/u Nephrology, Bun/creat 37/2.1 09/28/23

## 2023-11-13 NOTE — Assessment & Plan Note (Signed)
 TSH 4.68 09/19/23,  on Levothyroxine .

## 2023-11-13 NOTE — Assessment & Plan Note (Signed)
 stable, on  Pantoprazole, off  Omeprazole, prn Zofran.

## 2023-11-13 NOTE — Assessment & Plan Note (Signed)
 Blood pressure is controlled, on Furosemide , off Spironolactone .

## 2023-11-14 ENCOUNTER — Encounter: Payer: Self-pay | Admitting: Nurse Practitioner

## 2023-11-16 LAB — BASIC METABOLIC PANEL WITH GFR
BUN: 39 — AB (ref 4–21)
CO2: 19 (ref 13–22)
Chloride: 111 — AB (ref 99–108)
Creatinine: 1.9 — AB (ref 0.5–1.1)
Glucose: 72
Potassium: 4.5 meq/L (ref 3.5–5.1)
Sodium: 137 (ref 137–147)

## 2023-11-16 LAB — CBC AND DIFFERENTIAL
HCT: 34 — AB (ref 36–46)
Hemoglobin: 10.4 — AB (ref 12.0–16.0)
Neutrophils Absolute: 3445
Platelets: 232 K/uL (ref 150–400)
WBC: 5.8

## 2023-11-16 LAB — HEPATIC FUNCTION PANEL
ALT: 11 U/L (ref 7–35)
AST: 12 — AB (ref 13–35)
Alkaline Phosphatase: 96 (ref 25–125)
Bilirubin, Total: 0.2

## 2023-11-16 LAB — COMPREHENSIVE METABOLIC PANEL WITH GFR
Albumin: 3.5 (ref 3.5–5.0)
Calcium: 8.7 (ref 8.7–10.7)
Globulin: 2.4
eGFR: 27

## 2023-11-16 LAB — CBC: RBC: 3.79 — AB (ref 3.87–5.11)

## 2023-11-24 NOTE — Progress Notes (Signed)
 Error

## 2023-12-04 ENCOUNTER — Encounter: Payer: Self-pay | Admitting: Nurse Practitioner

## 2023-12-04 ENCOUNTER — Non-Acute Institutional Stay (SKILLED_NURSING_FACILITY): Payer: Self-pay | Admitting: Nurse Practitioner

## 2023-12-04 DIAGNOSIS — I5032 Chronic diastolic (congestive) heart failure: Secondary | ICD-10-CM | POA: Diagnosis not present

## 2023-12-04 DIAGNOSIS — I10 Essential (primary) hypertension: Secondary | ICD-10-CM

## 2023-12-04 DIAGNOSIS — Z89621 Acquired absence of right hip joint: Secondary | ICD-10-CM

## 2023-12-04 DIAGNOSIS — K2100 Gastro-esophageal reflux disease with esophagitis, without bleeding: Secondary | ICD-10-CM

## 2023-12-04 DIAGNOSIS — M545 Low back pain, unspecified: Secondary | ICD-10-CM

## 2023-12-04 DIAGNOSIS — E039 Hypothyroidism, unspecified: Secondary | ICD-10-CM

## 2023-12-04 DIAGNOSIS — D500 Iron deficiency anemia secondary to blood loss (chronic): Secondary | ICD-10-CM

## 2023-12-04 NOTE — Assessment & Plan Note (Signed)
 TSH 4.68 09/19/23,  on Levothyroxine .

## 2023-12-04 NOTE — Assessment & Plan Note (Signed)
 Compensated clinically, dependent  areas, resumed Furosemide , off Spironolactone , BNP 98 11/15/22,  06/01/23 pulmonology:  albuterol , DuoNeb

## 2023-12-04 NOTE — Assessment & Plan Note (Signed)
 post op, s/p EGD no active bleed. S/p 6 u PRBC transfusion, ASA was dc'd, On Fe, Iron 15 in hospital, Hgb 10.4 11/16/23

## 2023-12-04 NOTE — Progress Notes (Signed)
 Location:  [facility] Room Number: [phone] A Place of Service:  SNF (31) Provider:  Kaede Clendenen X, NP  Patient Care Team: Kordell Jafri X, NP as PCP - General (Internal Medicine) [name], MD as PCP - Cardiology (Cardiology) [name], MD as PCP - Sleep Medicine (Cardiology) [name], MD (Sports Medicine) [name], MD (Ophthalmology) [name], [name], MD (Dermatology) [name], MD (Gastroenterology) [name], [name], MD (Nephrology) [name], DC as Referring Physician (Chiropractic Medicine)  Extended Emergency Contact Information Primary Emergency Contact: Villamor,[name] Address: [address]          [place], [place] [address] [place] Home Phone: [phone] Mobile Phone: [phone] Relation: Spouse  Code Status:  DNR Goals of care: Advanced Directive information    10/16/2023   12:48 PM  Advanced Directives  Does Patient Have a Medical Advance Directive? Yes  Type of Advance Directive Out of facility DNR (pink MOST or yellow form)     Chief Complaint  Patient presents with   Medical Management of Chronic Issues    Routine visit     HPI:  Pt is a 83 y.o. female seen today for medical management of chronic diseases.     lower back pain, controlled, prn Tramadol and Tylenol  available to her, X-ray lumbar spine, sacrococcyx 06/08/23 negative fx.              Hospitalized 01/10/23-01/12/23 for UTI, diverticulitis, treated with Zosyn , Cefadroxil , Flagyl , developed pruritic rash, then ID changed to Cipro  and Flagyl -complete 01/19/23, last colonoscopy 2018. Has x1 Fosfomycin.               Hospitalized for AKI 10/13/22-10/19/22 for AKI             Hospitalized 07/08/22 for CHF, abscess of the right hip which previous infected, treated, and prosthesis was removed and never replaced.               Hospitalized 07/02/22-07/07/22 for the right hip cellulitis, CT hip in hospital fluid  collection unclear if it communicates with the joint space, Ortho not felt to be related to the joint space and may be an old fluid collection, aspiration not recommended. 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 TDWB. Power wc. 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 Fully treated, then off antibiotics. 11/17/22 ID no infection, no tx, healed.               Elevated AST/ALT normalized, S/p cholecystectomy, US  02/19/21 no cyst or mass.                    Anemia, post op, s/p EGD no active bleed. S/p 6 u PRBC transfusion, ASA was dc'd, On Fe, Iron 15 in hospital, Hgb 10.4 11/16/23             Muscle spasm, left lower back, resolved, off Gabapentin , failed Methocarbamol              HTN on Furosemide , off Spironolactone .              Morbid obesity  OSA CPAP             Chronic diarrhea, prn Imodium, Align             RA f/u Rheumatology, hx of Plaquenil use, stable presently.              Insomnia/depression/anxiety, stable, off Mirtazapine .              GERD, stable, on  Pantoprazole , off  Omeprazole, prn Zofran .             CHF/Edema, compensated clinically, CKD stage 4, f/u Nephrology, Bun/creat 39/1.9 11/16/23 Hyponatremia, Na 137 11/16/23             Hypothyroidism, TSH 4.68 09/19/23,  on Levothyroxine .                   Past Medical History:  Diagnosis Date   Benign hypertension with chronic kidney disease, stage III (HCC)    Overview:  Last Assessment & Plan:  Usually the patient is hypertensive. However today her blood pressure is 105 systolic. She has been feeling fatigued with some lightheadedness. Part of this may be related to her lower blood pressure. Her pressure may be improved with her decrease in salt and fluid intake. I've instructed her to put her  lisinopril/hctz on hold. 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 It was possibly reentrant. She does well with diltiazem . No change in therapy.   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   TOTAL KNEE ARTHROPLASTY Right 06/13/2011    Allergies  Allergen Reactions   Ciprofloxacin     Keflex [Cephalexin] Diarrhea   Macrobid [Nitrofurantoin] Hives   Amoxicillin Rash   Cefadroxil  Rash    Outpatient Encounter Medications as of 12/04/2023  Medication Sig   acetaminophen  (TYLENOL ) 325 MG tablet Take 650 mg by mouth every 4 (four) hours as needed. 2 tablet by mouth every 4 hours as needed for fever and pain.   albuterol  (VENTOLIN  HFA) 108 (90  Base) MCG/ACT inhaler Inhale 2 puffs into the lungs every 6 (six) hours as needed for  wheezing or shortness of breath.   bifidobacterium infantis (ALIGN) capsule Take 1 capsule by mouth daily.   Calcium Carb-Cholecalciferol  (CALCIUM PLUS VITAMIN D3 PO) Take 1 tablet by mouth daily. Vitamin D3 20mcg + Calcium 600   Cranberry 500 MG TABS Take 500 mg by mouth in the morning and at bedtime.   docusate sodium  (COLACE) 100 MG capsule Take 100 mg by mouth daily. Give 100 mg by mouth every 24 hours as needed for constipation   ferrous sulfate  325 (65 FE) MG EC tablet Take 325 mg by mouth daily.   furosemide  (LASIX ) 20 MG tablet Take 20 mg by mouth daily.   guaiFENesin -dextromethorphan (ROBITUSSIN DM) 100-10 MG/5ML syrup Take 10 mLs by mouth every 4 (four) hours as needed for cough.   hydrocortisone cream 1 % Apply 1 Application topically at bedtime. Apply to urogenital area topically at bedtime every other day for itchy areas related to vaginitis.   ipratropium-albuterol  (DUONEB) 0.5-2.5 (3) MG/3ML SOLN Inhale 3 mLs into the lungs every 6 (six) hours as needed (wheezing). And every 8 hours as needed per [facility]   ketoconazole (NIZORAL) 2 % cream Apply 1 Application topically 2 (two) times daily.   levothyroxine  (SYNTHROID ) 100 MCG tablet Take 100 mcg by mouth daily before breakfast.   loperamide (IMODIUM) 2 MG capsule Take 2 mg by mouth every 4 (four) hours as needed for diarrhea or loose stools.   loratadine  (CLARITIN ) 10 MG tablet Take 10 mg by mouth daily.   nystatin (MYCOSTATIN/NYSTOP) powder Apply 1 application  topically in the morning, at noon, in the evening, and at bedtime. To genital areas and inner thigh   ondansetron  (ZOFRAN ) 4 MG tablet Take 4 mg by mouth every 8 (eight) hours as needed for nausea or vomiting.   pantoprazole  (PROTONIX ) 40 MG tablet Take 40 mg by mouth daily.   potassium chloride  (KLOR-CON  M) 10 MEQ tablet Take 3 tablets by mouth daily.   traMADol (ULTRAM) 50 MG  tablet Take 50 mg by mouth every 6 (six) hours as needed.   triamcinolone cream (KENALOG) 0.5 % Apply 1 Application topically 2 (two) times daily.   Cholecalciferol  (VITAMIN D3) 50 MCG (2000 UT) TABS Take 2 tablets by mouth daily. (Patient not taking: Reported on 12/04/2023)   No facility-administered encounter medications on file as of 12/04/2023.    Review of Systems  Constitutional:  Negative for appetite change, fatigue and fever.  HENT:  Negative for congestion, sore throat and trouble swallowing.   Eyes:  Negative for visual disturbance.  Respiratory:  Negative for cough, shortness of breath and wheezing.   Cardiovascular:  Positive for leg swelling.  Gastrointestinal:  Negative for constipation.  Genitourinary:  Positive for frequency. Negative for dysuria and urgency.  Musculoskeletal:  Positive for arthralgias, back pain and gait problem.       Mid to lower back pain, worsened, positional Lower back pain  Skin:  Negative for color change.  Neurological:  Negative for speech difficulty and weakness.  Psychiatric/Behavioral:  Negative for confusion and sleep disturbance. The patient is not nervous/anxious.     Immunization History  Administered Date(s) Administered   Fluzone Influenza virus vaccine,trivalent (IIV3), split virus 02/20/2012, 02/11/2013, 02/02/2016, 01/11/2017, 02/04/2019   Influenza Split 01/28/2008, 02/17/2009   Influenza, High Dose Seasonal PF 02/04/2015, 01/11/2017, 01/30/2018, 02/05/2020, 01/25/2023   Influenza-Unspecified 02/16/2022   Moderna Covid-19 Vaccine Bivalent Booster 61yrs & up 04/13/2023   Moderna SARS-COV2 Booster Vaccination 02/24/2022   Moderna Sars-Covid-2 Vaccination 04/29/2019, 05/09/2019, 05/27/2019,  03/03/2020, 09/04/2020, 11/04/2020   Pfizer Covid-19 Vaccine Bivalent Booster 34yrs & up 02/09/2021   Pneumococcal Conjugate-13 08/30/2013   Pneumococcal Polysaccharide-23 11/03/2005   RSV,unspecified 04/22/2022   Td 08/01/2001, 09/05/2019    Td (Adult) 08/01/2001   Tdap 09/22/2010, 11/24/2020   Zoster Recombinant(Shingrix) 11/08/2016, 01/11/2017   Zoster, Live 11/04/2005, 11/08/2016, 01/11/2017   Zoster, Unspecified 01/11/2017   Pertinent  Health Maintenance Due  Topic Date Due   INFLUENZA VACCINE  12/25/2023 (Originally 11/24/2023)   DEXA SCAN  Completed      04/15/2022   10:15 AM 04/27/2022   11:35 AM 05/31/2022    9:34 AM 07/26/2022    9:31 AM 10/21/2022    9:31 AM  Fall Risk  Falls in the past year? 0 0 0 0 0  Was there an injury with Fall? 0 0 0 0 0  Fall Risk Category Calculator 0 0 0 0 0  Fall Risk Category (Retired) Low  Low      (RETIRED) Patient Fall Risk Level Moderate fall risk  Moderate fall risk      Patient at Risk for Falls Due to History of fall(s) History of fall(s) History of fall(s)  No Fall Risks  Fall risk Follow up Falls evaluation completed  Falls evaluation completed  Falls evaluation completed  Falls evaluation completed     Data saved with a previous flowsheet row definition   Functional Status Survey:    Vitals:   12/04/23 1020  BP: 114/70  Pulse: 74  SpO2: 96%  Weight: 289 lb (131.1 kg)  Height: 5' 8 (1.727 m)   Body mass index is 43.94 kg/m. Physical Exam Vitals and nursing note reviewed.  Constitutional:      Appearance: Normal appearance. She is obese.  HENT:     Head: Normocephalic and atraumatic.     Nose: Nose normal.     Mouth/Throat:     Mouth: Mucous membranes are moist.  Eyes:     Extraocular Movements: Extraocular movements intact.     Conjunctiva/sclera: Conjunctivae normal.     Pupils: Pupils are equal, round, and reactive to light.  Cardiovascular:     Rate and Rhythm: Normal rate and regular rhythm.     Heart sounds: No murmur heard. Pulmonary:     Effort: Pulmonary effort is normal.     Breath sounds: No rales.  Abdominal:     General: Bowel sounds are normal.     Palpations: Abdomen is soft.     Tenderness: There is no abdominal tenderness.   Musculoskeletal:        General: Tenderness present.     Cervical back: Normal range of motion and neck supple.     Right lower leg: Edema present.     Left lower leg: Edema present.     Comments: R hip, prosthetic hip removed. Dependent edema moderate. Lower back pain is flared up, no spine no spinous process tenderness palpated, pain is positional  Skin:    General: Skin is warm and dry.     Comments: Right hip surgical scar.  Edema mostly in the dependent areas, buttock, thighs, legs.   Neurological:     General: No focal deficit present.     Mental Status: She is alert and oriented to person, place, and time.     Gait: Gait abnormal.  Psychiatric:        Mood and Affect: Mood normal.        Behavior: Behavior normal.  Thought Content: Thought content normal.        Judgment: Judgment normal.     Labs reviewed: Recent Labs    01/10/23 1938 01/11/23 0500 01/16/23 0000 09/19/23 0000 09/28/23 1316 11/16/23 0000  NA 135 136   < > 139 139 137  K 3.8 3.4*   < > 4.6 5.1 4.5  CL 107 108   < > 111* 110* 111*  CO2 21* 21*   < > 19 18 19   GLUCOSE 105* 87  --   --   --   --   BUN 31* 29*   < > 40* 37* 39*  CREATININE 2.10* 1.95*   < > 1.8* 2.1* 1.9*  CALCIUM 8.7* 8.4*   < > 8.9 9.0 8.7   < > = values in this interval not displayed.   Recent Labs    01/10/23 1938 09/19/23 0000 11/16/23 0000  AST 21 12* 12*  ALT 17 9 11   ALKPHOS 87 87 96  BILITOT 0.6  --   --   PROT 7.3  --   --   ALBUMIN 3.5 3.5 3.5   Recent Labs    01/10/23 1938 01/11/23 0500 01/16/23 0000 02/09/23 0000 09/19/23 0000 09/28/23 1259 11/16/23 0000  WBC 6.5 6.1 7.5   < > 5.2 6.8 5.8  NEUTROABS 3.6  --  4,770.00  --  2,974.00  --  3,445.00  HGB 11.8* 10.8* 10.7*   < > 11.1* 12.3 10.4*  HCT 39.4 35.3* 34*   < > 36 38 34*  MCV 89.5 89.1  --   --   --   --   --   PLT 272 230 225   < > 220 223 232   < > = values in this interval not displayed.   Lab Results  Component Value Date   TSH  4.68 09/19/2023   No results found for: HGBA1C No results found for: CHOL, HDL, LDLCALC, LDLDIRECT, TRIG, CHOLHDL  Significant Diagnostic Results in last 30 days:  No results found.  Assessment/Plan Chronic heart failure with preserved ejection fraction (HFpEF) (HCC) Compensated clinically, dependent  areas, resumed Furosemide , off Spironolactone , BNP 98 11/15/22,  06/01/23 pulmonology:  albuterol , DuoNeb  Hypothyroidism  TSH 4.68 09/19/23,  on Levothyroxine .                  GERD (gastroesophageal reflux disease)  stable, on  Pantoprazole , off  Omeprazole, prn Zofran .  Essential hypertension Blood pressure is controlled, on Furosemide , off Spironolactone .   Blood loss anemia post op, s/p EGD no active bleed. 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 TDWB. Power wc. 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 Fully treated, then off antibiotics. 11/17/22 ID no infection, no tx, healed.   Lower back pain Recurrent, positional, will obtain x-ray of lumbar/thoracic spine, will schedule Tylenol  500 mg and tramadol 50 mg every 6 hours by mouth for 3 days, activity as tolerated     Family/ staff Communication: Plan of care reviewed with the patient and charge nurse  Labs/tests ordered: X-ray of thoracic/lumbar spine

## 2023-12-04 NOTE — Assessment & Plan Note (Signed)
 Blood pressure is controlled, on Furosemide , off Spironolactone .

## 2023-12-04 NOTE — Assessment & Plan Note (Signed)
 stable, on  Pantoprazole, off  Omeprazole, prn Zofran.

## 2023-12-04 NOTE — Assessment & Plan Note (Signed)
 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 TDWB. Power wc. 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 Fully treated, then off antibiotics. 11/17/22 ID no infection, no tx, healed.

## 2023-12-07 NOTE — Assessment & Plan Note (Signed)
 Recurrent, positional, will obtain x-ray of lumbar/thoracic spine, will schedule Tylenol  500 mg and tramadol 50 mg every 6 hours by mouth for 3 days, activity as tolerated

## 2023-12-10 ENCOUNTER — Emergency Department (HOSPITAL_COMMUNITY)

## 2023-12-10 ENCOUNTER — Other Ambulatory Visit: Payer: Self-pay

## 2023-12-10 ENCOUNTER — Encounter (HOSPITAL_COMMUNITY): Payer: Self-pay

## 2023-12-10 ENCOUNTER — Inpatient Hospital Stay (HOSPITAL_COMMUNITY)
Admission: EM | Admit: 2023-12-10 | Discharge: 2023-12-17 | DRG: 177 | Disposition: A | Discharge status: Skilled Nursing Facility | Source: Skilled Nursing Facility | Attending: Internal Medicine | Admitting: Internal Medicine

## 2023-12-10 DIAGNOSIS — N179 Acute kidney failure, unspecified: Secondary | ICD-10-CM | POA: Diagnosis present

## 2023-12-10 DIAGNOSIS — Z993 Dependence on wheelchair: Secondary | ICD-10-CM

## 2023-12-10 DIAGNOSIS — Z87891 Personal history of nicotine dependence: Secondary | ICD-10-CM | POA: Diagnosis not present

## 2023-12-10 DIAGNOSIS — I130 Hypertensive heart and chronic kidney disease with heart failure and stage 1 through stage 4 chronic kidney disease, or unspecified chronic kidney disease: Secondary | ICD-10-CM | POA: Diagnosis present

## 2023-12-10 DIAGNOSIS — L270 Generalized skin eruption due to drugs and medicaments taken internally: Secondary | ICD-10-CM | POA: Diagnosis not present

## 2023-12-10 DIAGNOSIS — B9689 Other specified bacterial agents as the cause of diseases classified elsewhere: Secondary | ICD-10-CM | POA: Diagnosis not present

## 2023-12-10 DIAGNOSIS — D696 Thrombocytopenia, unspecified: Secondary | ICD-10-CM | POA: Diagnosis present

## 2023-12-10 DIAGNOSIS — G9341 Metabolic encephalopathy: Secondary | ICD-10-CM | POA: Diagnosis present

## 2023-12-10 DIAGNOSIS — T68XXXA Hypothermia, initial encounter: Secondary | ICD-10-CM | POA: Diagnosis not present

## 2023-12-10 DIAGNOSIS — Z79890 Hormone replacement therapy (postmenopausal): Secondary | ICD-10-CM

## 2023-12-10 DIAGNOSIS — G4733 Obstructive sleep apnea (adult) (pediatric): Secondary | ICD-10-CM | POA: Diagnosis present

## 2023-12-10 DIAGNOSIS — R680 Hypothermia, not associated with low environmental temperature: Secondary | ICD-10-CM | POA: Diagnosis present

## 2023-12-10 DIAGNOSIS — Z883 Allergy status to other anti-infective agents status: Secondary | ICD-10-CM

## 2023-12-10 DIAGNOSIS — Z96653 Presence of artificial knee joint, bilateral: Secondary | ICD-10-CM | POA: Diagnosis present

## 2023-12-10 DIAGNOSIS — J690 Pneumonitis due to inhalation of food and vomit: Secondary | ICD-10-CM | POA: Diagnosis present

## 2023-12-10 DIAGNOSIS — I700 Atherosclerosis of aorta: Secondary | ICD-10-CM | POA: Diagnosis present

## 2023-12-10 DIAGNOSIS — E039 Hypothyroidism, unspecified: Secondary | ICD-10-CM | POA: Diagnosis present

## 2023-12-10 DIAGNOSIS — Z880 Allergy status to penicillin: Secondary | ICD-10-CM

## 2023-12-10 DIAGNOSIS — T360X5A Adverse effect of penicillins, initial encounter: Secondary | ICD-10-CM | POA: Diagnosis present

## 2023-12-10 DIAGNOSIS — N184 Chronic kidney disease, stage 4 (severe): Secondary | ICD-10-CM | POA: Diagnosis present

## 2023-12-10 DIAGNOSIS — E871 Hypo-osmolality and hyponatremia: Secondary | ICD-10-CM | POA: Diagnosis present

## 2023-12-10 DIAGNOSIS — Z9049 Acquired absence of other specified parts of digestive tract: Secondary | ICD-10-CM

## 2023-12-10 DIAGNOSIS — Z833 Family history of diabetes mellitus: Secondary | ICD-10-CM

## 2023-12-10 DIAGNOSIS — I5032 Chronic diastolic (congestive) heart failure: Secondary | ICD-10-CM | POA: Diagnosis present

## 2023-12-10 DIAGNOSIS — Z8249 Family history of ischemic heart disease and other diseases of the circulatory system: Secondary | ICD-10-CM

## 2023-12-10 DIAGNOSIS — Z7401 Bed confinement status: Secondary | ICD-10-CM

## 2023-12-10 DIAGNOSIS — G3184 Mild cognitive impairment, so stated: Secondary | ICD-10-CM | POA: Diagnosis present

## 2023-12-10 DIAGNOSIS — E669 Obesity, unspecified: Secondary | ICD-10-CM | POA: Diagnosis present

## 2023-12-10 DIAGNOSIS — H1589 Other disorders of sclera: Secondary | ICD-10-CM | POA: Insufficient documentation

## 2023-12-10 DIAGNOSIS — G9340 Encephalopathy, unspecified: Secondary | ICD-10-CM | POA: Diagnosis present

## 2023-12-10 DIAGNOSIS — J189 Pneumonia, unspecified organism: Principal | ICD-10-CM | POA: Diagnosis present

## 2023-12-10 DIAGNOSIS — Z1152 Encounter for screening for COVID-19: Secondary | ICD-10-CM

## 2023-12-10 DIAGNOSIS — Z79899 Other long term (current) drug therapy: Secondary | ICD-10-CM

## 2023-12-10 DIAGNOSIS — K219 Gastro-esophageal reflux disease without esophagitis: Secondary | ICD-10-CM | POA: Diagnosis present

## 2023-12-10 DIAGNOSIS — Z888 Allergy status to other drugs, medicaments and biological substances status: Secondary | ICD-10-CM

## 2023-12-10 DIAGNOSIS — B961 Klebsiella pneumoniae [K. pneumoniae] as the cause of diseases classified elsewhere: Secondary | ICD-10-CM | POA: Diagnosis present

## 2023-12-10 DIAGNOSIS — R338 Other retention of urine: Secondary | ICD-10-CM | POA: Insufficient documentation

## 2023-12-10 DIAGNOSIS — E875 Hyperkalemia: Secondary | ICD-10-CM | POA: Diagnosis present

## 2023-12-10 DIAGNOSIS — R5381 Other malaise: Secondary | ICD-10-CM | POA: Diagnosis present

## 2023-12-10 DIAGNOSIS — Z89621 Acquired absence of right hip joint: Secondary | ICD-10-CM | POA: Diagnosis present

## 2023-12-10 DIAGNOSIS — N390 Urinary tract infection, site not specified: Secondary | ICD-10-CM | POA: Diagnosis present

## 2023-12-10 DIAGNOSIS — Z881 Allergy status to other antibiotic agents status: Secondary | ICD-10-CM | POA: Diagnosis not present

## 2023-12-10 LAB — CBC WITH DIFFERENTIAL/PLATELET
Abs Immature Granulocytes: 0.08 K/uL — ABNORMAL HIGH (ref 0.00–0.07)
Basophils Absolute: 0 K/uL (ref 0.0–0.1)
Basophils Relative: 0 %
Eosinophils Absolute: 0.1 K/uL (ref 0.0–0.5)
Eosinophils Relative: 2 %
HCT: 35.7 % — ABNORMAL LOW (ref 36.0–46.0)
Hemoglobin: 10.6 g/dL — ABNORMAL LOW (ref 12.0–15.0)
Immature Granulocytes: 2 %
Lymphocytes Relative: 17 %
Lymphs Abs: 0.9 K/uL (ref 0.7–4.0)
MCH: 27.5 pg (ref 26.0–34.0)
MCHC: 29.7 g/dL — ABNORMAL LOW (ref 30.0–36.0)
MCV: 92.5 fL (ref 80.0–100.0)
Monocytes Absolute: 0.4 K/uL (ref 0.1–1.0)
Monocytes Relative: 7 %
Neutro Abs: 3.9 K/uL (ref 1.7–7.7)
Neutrophils Relative %: 72 %
Platelets: 142 K/uL — ABNORMAL LOW (ref 150–400)
RBC: 3.86 MIL/uL — ABNORMAL LOW (ref 3.87–5.11)
RDW: 15.1 % (ref 11.5–15.5)
WBC: 5.4 K/uL (ref 4.0–10.5)
nRBC: 0 % (ref 0.0–0.2)

## 2023-12-10 LAB — COMPREHENSIVE METABOLIC PANEL WITH GFR
ALT: 15 U/L (ref 0–44)
AST: 14 U/L — ABNORMAL LOW (ref 15–41)
Albumin: 3.5 g/dL (ref 3.5–5.0)
Alkaline Phosphatase: 111 U/L (ref 38–126)
Anion gap: 9 (ref 5–15)
BUN: 35 mg/dL — ABNORMAL HIGH (ref 8–23)
CO2: 17 mmol/L — ABNORMAL LOW (ref 22–32)
Calcium: 9 mg/dL (ref 8.9–10.3)
Chloride: 106 mmol/L (ref 98–111)
Creatinine, Ser: 2.11 mg/dL — ABNORMAL HIGH (ref 0.44–1.00)
GFR, Estimated: 23 mL/min — ABNORMAL LOW (ref >60)
Glucose, Bld: 82 mg/dL (ref 70–99)
Potassium: 5.7 mmol/L — ABNORMAL HIGH (ref 3.5–5.1)
Sodium: 132 mmol/L — ABNORMAL LOW (ref 135–145)
Total Bilirubin: 0.5 mg/dL (ref 0.0–1.2)
Total Protein: 7.1 g/dL (ref 6.5–8.1)

## 2023-12-10 LAB — RESP PANEL BY RT-PCR (RSV, FLU A&B, COVID)  RVPGX2
Influenza A by PCR: NEGATIVE
Influenza B by PCR: NEGATIVE
Resp Syncytial Virus by PCR: NEGATIVE
SARS Coronavirus 2 by RT PCR: NEGATIVE

## 2023-12-10 LAB — URINALYSIS, W/ REFLEX TO CULTURE (INFECTION SUSPECTED)
Bilirubin Urine: NEGATIVE
Glucose, UA: NEGATIVE mg/dL
Hgb urine dipstick: NEGATIVE
Ketones, ur: NEGATIVE mg/dL
Nitrite: NEGATIVE
Protein, ur: NEGATIVE mg/dL
Specific Gravity, Urine: 1.004 — ABNORMAL LOW (ref 1.005–1.030)
pH: 5 (ref 5.0–8.0)

## 2023-12-10 LAB — CBG MONITORING, ED
Glucose-Capillary: 101 mg/dL — ABNORMAL HIGH (ref 70–99)
Glucose-Capillary: 69 mg/dL — ABNORMAL LOW (ref 70–99)
Glucose-Capillary: 79 mg/dL (ref 70–99)
Glucose-Capillary: 79 mg/dL (ref 70–99)

## 2023-12-10 LAB — PROTIME-INR
INR: 1 (ref 0.8–1.2)
Prothrombin Time: 13.9 s (ref 11.4–15.2)

## 2023-12-10 LAB — I-STAT CG4 LACTIC ACID, ED
Lactic Acid, Venous: 0.4 mmol/L — ABNORMAL LOW (ref 0.5–1.9)
Lactic Acid, Venous: 0.8 mmol/L (ref 0.5–1.9)

## 2023-12-10 MED ORDER — METRONIDAZOLE 500 MG/100ML IV SOLN
500.0000 mg | Freq: Once | INTRAVENOUS | Status: AC
  Administered 2023-12-10: 500 mg via INTRAVENOUS
  Filled 2023-12-10: qty 100

## 2023-12-10 MED ORDER — SODIUM ZIRCONIUM CYCLOSILICATE 10 G PO PACK
10.0000 g | PACK | Freq: Once | ORAL | Status: AC
  Administered 2023-12-11: 10 g via ORAL
  Filled 2023-12-10: qty 1

## 2023-12-10 MED ORDER — LACTATED RINGERS IV SOLN: INTRAVENOUS | Status: DC

## 2023-12-10 MED ORDER — VANCOMYCIN HCL IN DEXTROSE 1-5 GM/200ML-% IV SOLN
1000.0000 mg | Freq: Once | INTRAVENOUS | Status: AC
  Administered 2023-12-10: 1000 mg via INTRAVENOUS
  Filled 2023-12-10 (×2): qty 200

## 2023-12-10 MED ORDER — SODIUM CHLORIDE 0.9 % IV SOLN
2.0000 g | Freq: Once | INTRAVENOUS | Status: AC
  Administered 2023-12-10: 2 g via INTRAVENOUS
  Filled 2023-12-10 (×2): qty 10

## 2023-12-10 NOTE — H&P (Incomplete)
 History and Physical    Madison [name] FMW:[id number] DOB: [birth year] DOA: 12/10/2023  PCP: Mast, Man X, NP  Patient coming from: SNF  I have personally briefly reviewed patient's old medical records in [facility]  Chief Complaint: Altered mental status  HPI: Madison [name] is a 83 y.o. 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  Initial vitals showed BP 114/41, pulse 64, RR 19, temp 94.9 F rectally, SpO2 95% on room air.  Labs showed WBC 5.4, hemoglobin 10.6, platelets 142, sodium 132, potassium 5.7, bicarb 17, BUN 35, creatinine 2.11, serum glucose 82, lactic acid 0.4 > 0.8.  SARS-CoV-2, influenza, RSV PCR negative.  Urinalysis showed negative nitrites, moderate leukocytes, 0-5 RBCs, 11-20 WBCs, few bacteria.  Blood and urine cultures in process.  CT chest/abdomen/pelvis without contrast IMPRESSION: 1. Diffuse bilateral bronchial wall thickening with narrowed appearance of airways consistent with airways inflammatory process and patchy mucous plugging most evident in the lower lobes.Scattered small foci of irregular consolidation and nodularity within the bilateral lower lobes, suspect for multifocal pneumonia or potential aspiration. 2. Diverticular disease of the colon. Difficult to exclude subtle fat stranding and minimal wall thickening at the descending/proximal sigmoid colon, as may be seen with mild diverticulitis. 3. Other chronic findings as described above 4. Aortic atherosclerosis.  Patient was given IV vancomycin , Flagyl , aztreonam  and placed on Bair hugger.  The hospitalist service was consulted to  admit.  Review of Systems: All systems reviewed and are negative except as documented in history of present illness above.   Past Medical History:  Diagnosis Date   Benign hypertension with chronic kidney disease, stage III (HCC)    Overview:  Last Assessment & Plan:  Usually the patient is hypertensive. However today her blood pressure is 105 systolic. She has been feeling fatigued with some lightheadedness. Part of this may be related to her lower blood pressure. Her pressure may be improved with her decrease in salt and fluid intake. I've instructed her to put her lisinopril/hctz on hold. 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 It was possibly reentrant. She does well with diltiazem . No change in therapy.   SVT (supraventricular tachycardia) (HCC)    Documented episode in the past, possibly reentrant tachycardia   Thyroid  disease    Urinary incontinence  Past Surgical History:  Procedure Laterality Date   CATARACT EXTRACTION Left 8/11   CATARACT EXTRACTION Right 3/12   CHOLECYSTECTOMY  1994   Copsilotomy Laser Treatment Left 6/12   eye   ELBOW SURGERY  8/12   EYE SURGERY Left 09/2008   macular hole    EYE SURGERY Right 12/11   Lumbar Infusion  2011   MOLE REMOVAL  06/17/2015   TOTAL KNEE ARTHROPLASTY Left 6/11   TOTAL KNEE ARTHROPLASTY Right 06/13/2011    Social History: Social History   Tobacco Use   Smoking status: Former    Passive exposure: Never   Smokeless tobacco: Never  Vaping Use   Vaping status: Never Used  Substance Use Topics   Alcohol  use: No   Drug use: No    Allergies  Allergen Reactions   Ciprofloxacin     Keflex [Cephalexin] Diarrhea   Macrobid [Nitrofurantoin] Hives   Amoxicillin Rash   Cefadroxil  Rash    Family History  Problem Relation Age of Onset   High blood pressure Mother    Diabetes Mother    Heart Problems Father    Diabetes Father    Prostate cancer Father    Heart attack Father    Heart attack Paternal Grandmother    Heart attack Maternal Grandfather    Heart attack Paternal Uncle    Stroke Neg Hx      Prior to Admission medications   Medication Sig Start Date End Date Taking? Authorizing Provider  acetaminophen  (TYLENOL ) 325 MG tablet Take 650 mg by mouth every 4 (four) hours as needed. 2 tablet by mouth every 4 hours as needed for fever and pain.    [provider]  albuterol  (VENTOLIN  HFA) 108 (90 Base) MCG/ACT inhaler Inhale 2 puffs into the lungs every 6 (six) hours as needed for wheezing or shortness of breath.    [provider]  bifidobacterium infantis (ALIGN) capsule Take 1 capsule by mouth daily.    [provider]  Calcium Carb-Cholecalciferol  (CALCIUM PLUS VITAMIN D3 PO) Take 1 tablet by mouth daily. Vitamin D3 20mcg + Calcium 600    [provider]  Cholecalciferol  (VITAMIN D3) 50 MCG (2000 UT) TABS Take 2 tablets by mouth daily. Patient not taking: Reported on 12/04/2023    [provider]  Cranberry 500 MG TABS Take 500 mg by mouth in the morning and at bedtime.    [provider]  docusate sodium  (COLACE) 100 MG capsule Take 100 mg by mouth daily. Give 100 mg by  mouth every 24 hours as needed for constipation    [provider]  ferrous sulfate  325 (65 FE) MG EC tablet Take 325 mg by mouth daily.    [provider]  furosemide  (LASIX ) 20 MG tablet Take 20 mg by mouth daily.    [provider]  guaiFENesin -dextromethorphan (ROBITUSSIN DM) 100-10 MG/5ML syrup Take 10 mLs by mouth every 4 (four) hours as needed for cough.    [provider]  hydrocortisone cream 1 % Apply 1 Application topically at bedtime. Apply to urogenital area topically at bedtime every other day for itchy areas related to vaginitis.    [provider]  ipratropium-albuterol  (DUONEB) 0.5-2.5 (3) MG/3ML SOLN Inhale 3 mLs into the lungs every 6 (six) hours as needed (wheezing). And every 8 hours as needed per [facility]    [provider]  ketoconazole (NIZORAL) 2 % cream Apply 1 Application topically 2 (two) times daily.    [provider]  levothyroxine  (SYNTHROID ) 100 MCG tablet Take 100 mcg by mouth daily before breakfast.    [provider]  loperamide (IMODIUM) 2 MG capsule Take 2 mg by mouth every 4 (four) hours as needed for diarrhea or loose stools. 02/04/21   [provider]  loratadine  (CLARITIN ) 10 MG tablet Take 10 mg by mouth daily.    [provider]  nystatin (MYCOSTATIN/NYSTOP) powder Apply 1 application  topically in the morning, at noon, in the evening, and at bedtime. To genital areas and inner thigh    [provider]  ondansetron  (ZOFRAN ) 4 MG tablet Take 4 mg by mouth every 8 (eight) hours as needed for nausea or vomiting.    [provider]  pantoprazole  (PROTONIX ) 40 MG tablet Take 40 mg by mouth daily.    [provider]  potassium chloride  (KLOR-CON  M) 10 MEQ tablet Take 3 tablets by mouth daily. 05/01/23   [provider]  traMADol (ULTRAM) 50 MG tablet Take 50 mg by mouth every 6 (six) hours as needed. 06/09/23   [provider]  triamcinolone cream (KENALOG) 0.5 % Apply 1 Application topically 2 (two) times daily.    [provider]    Physical Exam: Vitals:   12/10/23 2152 12/10/23 2200 12/10/23 2230 12/10/23 2300  BP:  132/65 131/64 139/67  Pulse: 69 67 70 67  Resp: 11 10 11 13   Temp: (!) 97.3 F (36.3 C)     TempSrc:      SpO2: 95% 92% 95% 96%  Weight:      Height:       *** Constitutional: NAD, calm, comfortable Eyes: PERRL, lids and conjunctivae normal ENMT: Mucous membranes are moist. Posterior pharynx clear of any exudate or lesions.Normal dentition.  Neck: normal, supple, no masses. Respiratory: clear to auscultation bilaterally, no wheezing, no crackles. Normal respiratory effort. No accessory muscle use.  Cardiovascular: Regular rate and rhythm, no murmurs / rubs / gallops. No extremity edema. 2+ pedal pulses. Abdomen: no tenderness, no masses palpated. Musculoskeletal: no clubbing / cyanosis. No joint deformity upper and lower extremities. Good ROM, no contractures. Normal muscle tone.  Skin: no rashes, lesions, ulcers. No induration Neurologic: Sensation intact. Strength 5/5 in all 4.  Psychiatric: Normal judgment and insight. Alert and oriented x 3. Normal mood.   EKG: Personally reviewed. Sinus rhythm, rate 65, no acute ischemic changes.  Assessment/Plan Principal Problem:   Multifocal pneumonia Active Problems:   Chronic heart failure with preserved ejection fraction (HFpEF) (HCC)   CKD (chronic kidney disease) stage 4, GFR 15-29 ml/min (HCC)   Hypothyroidism   Absence of hip joint, right   Obstructive sleep apnea   MCI (mild cognitive impairment) with memory loss   Hypothermia   Hyperkalemia   Madison [name] is a 83 y.o. 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    DVT prophylaxis: ***  Code Status: ***  Family Communication: ***  Disposition Plan: ***  Consults called: ***  Severity of Illness: The appropriate patient status for this patient is INPATIENT. Inpatient status is judged to be reasonable and necessary in order to provide the required intensity of service to ensure the patient's safety. The patient's presenting symptoms, physical exam findings, and initial radiographic and laboratory data in the context of their chronic comorbidities is felt to place them at high risk for further clinical deterioration. Furthermore, it is not anticipated that the patient will be medically stable for discharge from the hospital within 2 midnights of admission.   * I certify that at the point of admission it is my clinical judgment that the patient will require inpatient hospital care spanning beyond 2 midnights from the point of admission due to high intensity of service, high risk for further deterioration and high frequency of surveillance required.DEWAINE Jorie Blanch MD [facility]  If 7PM-7AM, please contact night-coverage [email or web address]  12/10/2023, 11:43 PM

## 2023-12-10 NOTE — ED Notes (Signed)
 RN assumed care at this time, report from [employer]

## 2023-12-10 NOTE — Hospital Course (Addendum)
 83 y.o. 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   Upon initial evaluation in the emergency department CT imaging of the chest abdomen and pelvis revealed concerns for multifocal pneumonia.  Patient was initiated on intravenous antibiotic therapy and the hospitalist group was then called to assess patient for admission in the hospital.  Throughout the hospital course the patient's encephalopathy resolved with treatment of underlying infection and the patient's pneumonia additionally showed signs of improvement.  Unfortunately hospitalization was complicated by development of a diffuse erythematous rash thought to be secondary to a drug reaction although the exact culprit medication was unclear.  As a precaution, patient's antibiotic regimen was transitioned to doxycycline  to complete her course of therapy.  Pruritic skin lesions were managed with as needed Benadryl .  Patient additionally developed extremely pruritic eyes and because of concurrent discharge patient was also placed on a 7-day course of Polytrim  eyedrops.  Hospitalization was additionally complicated by intermittent hypothermia.  This was extensively evaluated.  No secondary source of infection could be identified.  Patient's TSH was near normal and was not thought to be the culprit either.  Hypothermia spontaneously resolved.  With patient's clinical improvement patient was discharged back to her skilled nursing facility friend's home on 12/17/2023 with the remaining course of her oral doxycycline  therapy and Polytrim  eyedrops.

## 2023-12-10 NOTE — ED Notes (Signed)
 ..ED TO INPATIENT HANDOFF REPORT  Name/Age/Gender Madison [name] 83 y.o. female  Code Status Code Status History     Date Active Date Inactive Code Status Order ID Comments User Context   02/07/2023 1643 12/10/2023 1737 DNR [id number]  Madison Jereld BROCKS, NP Outpatient   01/11/2023 1635 01/13/2023 0028 Limited: Do not attempt resuscitation (DNR) -DNR-LIMITED -Do Not Intubate/DNI  [id number]  Madison Trenda BIRCH, MD Inpatient   01/10/2023 2303 01/11/2023 1635 Full Code [id number]  Madison Jorie SAUNDERS, MD ED   10/13/2022 2151 10/20/2022 0013 Full Code [id number]  Madison Jorie SAUNDERS, MD ED   07/08/2022 1924 07/14/2022 2008 Full Code [id number]  Madison Elidia Sieving, MD ED   07/03/2022 0406 07/07/2022 2312 Full Code [id number]  Howerter, Eva NOVAK, DO ED    Questions for Most Recent Historical Code Status (Order [id number])     Question Answer   If patient has no pulse and is not breathing Do Not Attempt Resuscitation   If patient has a pulse and/or is breathing: Medical Treatment Goals LIMITED ADDITIONAL INTERVENTIONS: Use medication/IV fluids and cardiac monitoring as indicated; Do not use intubation or mechanical ventilation (DNI), also provide comfort medications.  Transfer to Progressive/Stepdown as indicated, avoid Intensive Care.   Consent: Discussion documented in EHR or advanced directives reviewed            Home/SNF/Other Skilled nursing facility  Chief Complaint Multifocal pneumonia [J18.9]  Level of Care/Admitting Diagnosis ED Disposition     ED Disposition  Admit   Condition  --   Comment  Hospital Area: [facility] [[id number]]  Level of Care: Progressive [102]  Admit to Progressive based on following criteria: MULTISYSTEM THREATS such as stable sepsis, metabolic/electrolyte imbalance with or without encephalopathy that is responding to early treatment.  May admit patient to [name] or [name] if equivalent level of care is available:: No  Covid  Evaluation: Confirmed COVID Negative  Diagnosis: Multifocal pneumonia [8421132]  Admitting Physician: Madison [name] [[id number]]  Attending Physician: Madison [name] [[id number]]  Certification:: I certify this patient will need inpatient services for at least 2 midnights  Expected Medical Readiness: 12/12/2023          Medical History Past Medical History:  Diagnosis Date   Benign hypertension with chronic kidney disease, stage III (HCC)    Overview:  Last Assessment & Plan:  Usually the patient is hypertensive. However today her blood pressure is 105 systolic. She has been feeling fatigued with some lightheadedness. Part of this may be related to her lower blood pressure. Her pressure may be improved with her decrease in salt and fluid intake. I've instructed her to put her lisinopril/hctz on hold. 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 It was possibly reentrant. She does well with diltiazem . No change in therapy.   SVT (supraventricular tachycardia) (HCC)    Documented episode in the past, possibly reentrant tachycardia   Thyroid  disease    Urinary incontinence     Allergies Allergies  Allergen Reactions    Ciprofloxacin     Keflex [Cephalexin] Diarrhea   Macrobid [Nitrofurantoin] Hives   Amoxicillin Rash   Cefadroxil  Rash    IV Location/Drains/Wounds Patient Lines/Drains/Airways Status     Active Line/Drains/Airways     Name Placement date Placement time Site Days   Peripheral IV 12/10/23 20 G 1 Left Antecubital 12/10/23  1820  Antecubital  less than 1   Peripheral IV 12/10/23 22 G 1 Anterior;Right Forearm 12/10/23  1825  Forearm  less than 1   Urethral Catheter Brie F. Paramedic Straight-tip 14 Fr. 12/10/23  1804  Straight-tip  less than 1   Wound / Incision (Open or Dehisced) 07/05/22 Thigh Anterior;Right Right Hip Cellulitis 07/05/22  0730  Thigh  523   Wound / Incision (Open or Dehisced) 10/13/22 Incision - Open;Other (Comment) Thigh Anterior;Right small round pencil eraser sized wound with xeroform packing, scant serosanguineous drainage 10/13/22  2330  Thigh  423            Labs/Imaging Results for orders placed or performed during the hospital encounter of 12/10/23 (from the past 48 hours)  CBG monitoring, ED     Status: Abnormal   Collection Time: 12/10/23  6:11 PM  Result Value Ref Range   Glucose-Capillary 69 (L) 70 - 99 mg/dL    Comment: Glucose reference range applies only to samples taken after fasting for at least 8 hours.  Comprehensive metabolic panel     Status: Abnormal   Collection Time: 12/10/23  6:22 PM  Result Value Ref Range   Sodium 132 (L) 135 - 145 mmol/L   Potassium 5.7 (H) 3.5 - 5.1 mmol/L   Chloride 106 98 - 111 mmol/L   CO2 17 (L) 22 - 32 mmol/L   Glucose, Bld 82 70 - 99 mg/dL    Comment: Glucose reference range applies only to samples taken after fasting for at least 8 hours.   BUN 35 (H) 8 - 23 mg/dL   Creatinine, Ser 7.88 (H) 0.44 - 1.00 mg/dL   Calcium 9.0 8.9 - 89.6 mg/dL   Total Protein 7.1 6.5 - 8.1 g/dL   Albumin 3.5 3.5 - 5.0 g/dL   AST 14 (L) 15 - 41 U/L   ALT 15 0 - 44 U/L   Alkaline Phosphatase 111 38 - 126 U/L   Total  Bilirubin 0.5 0.0 - 1.2 mg/dL   GFR, Estimated 23 (L) >60 mL/min    Comment: (NOTE) Calculated using the CKD-EPI Creatinine Equation (2021)    Anion gap 9 5 - 15    Comment: Performed at [facility], [address] [place], [place] [address]  CBC with Differential     Status: Abnormal   Collection Time: 12/10/23  6:22 PM  Result Value Ref Range   WBC 5.4 4.0 - 10.5 K/uL   RBC 3.86 (L) 3.87 - 5.11 MIL/uL   Hemoglobin 10.6 (L) 12.0 - 15.0 g/dL   HCT 64.2 (L) 63.9 - 53.9 %   MCV 92.5 80.0 - 100.0 fL   MCH 27.5 26.0 - 34.0 pg   MCHC 29.7 (L) 30.0 - 36.0 g/dL  RDW 15.1 11.5 - 15.5 %   Platelets 142 (L) 150 - 400 K/uL   nRBC 0.0 0.0 - 0.2 %   Neutrophils Relative % 72 %   Neutro Abs 3.9 1.7 - 7.7 K/uL   Lymphocytes Relative 17 %   Lymphs Abs 0.9 0.7 - 4.0 K/uL   Monocytes Relative 7 %   Monocytes Absolute 0.4 0.1 - 1.0 K/uL   Eosinophils Relative 2 %   Eosinophils Absolute 0.1 0.0 - 0.5 K/uL   Basophils Relative 0 %   Basophils Absolute 0.0 0.0 - 0.1 K/uL   Immature Granulocytes 2 %   Abs Immature Granulocytes 0.08 (H) 0.00 - 0.07 K/uL    Comment: Performed at [facility], [address] [place], [place] [address]  Protime-INR     Status: None   Collection Time: 12/10/23  6:22 PM  Result Value Ref Range   Prothrombin Time 13.9 11.4 - 15.2 seconds   INR 1.0 0.8 - 1.2    Comment: (NOTE) INR goal varies based on device and disease states. Performed at [facility], [address], [place], [place] [address]   Urinalysis, w/ Reflex to Culture (Infection Suspected) -Urine, Catheterized     Status: Abnormal   Collection Time: 12/10/23  6:27 PM  Result Value Ref Range   Specimen Source URINE, CATHETERIZED    Color, Urine STRAW (A) YELLOW   APPearance CLEAR CLEAR   Specific Gravity, Urine 1.004 (L) 1.005 - 1.030   pH 5.0 5.0 - 8.0   Glucose, UA NEGATIVE NEGATIVE mg/dL   Hgb urine dipstick NEGATIVE NEGATIVE    Bilirubin Urine NEGATIVE NEGATIVE   Ketones, ur NEGATIVE NEGATIVE mg/dL   Protein, ur NEGATIVE NEGATIVE mg/dL   Nitrite NEGATIVE NEGATIVE   Leukocytes,Ua MODERATE (A) NEGATIVE   RBC / HPF 0-5 0 - 5 RBC/hpf   WBC, UA 11-20 0 - 5 WBC/hpf    Comment:        Reflex urine culture not performed if WBC <=10, OR if Squamous epithelial cells >5. If Squamous epithelial cells >5 suggest recollection.    Bacteria, UA FEW (A) NONE SEEN   Squamous Epithelial / HPF 0-5 0 - 5 /HPF   Mucus PRESENT    Hyaline Casts, UA PRESENT     Comment: Performed at [facility], [address], [place], [place] [address]  I-Stat Lactic Acid, ED     Status: Abnormal   Collection Time: 12/10/23  6:31 PM  Result Value Ref Range   Lactic Acid, Venous 0.4 (L) 0.5 - 1.9 mmol/L  Resp panel by RT-PCR (RSV, Flu A&B, Covid) Anterior Nasal Swab     Status: None   Collection Time: 12/10/23  6:35 PM   Specimen: Anterior Nasal Swab  Result Value Ref Range   SARS Coronavirus 2 by RT PCR NEGATIVE NEGATIVE    Comment: (NOTE) SARS-CoV-2 target nucleic acids are NOT DETECTED.  The SARS-CoV-2 RNA is generally detectable in upper respiratory specimens during the acute phase of infection. The lowest concentration of SARS-CoV-2 viral copies this assay can detect is 138 copies/mL. A negative result does not preclude SARS-Cov-2 infection and should not be used as the sole basis for treatment or other patient management decisions. A negative result may occur with  improper specimen collection/handling, submission of specimen other than nasopharyngeal swab, presence of viral mutation(s) within the areas targeted by this assay, and inadequate number of viral copies(<138 copies/mL). A negative result must be combined with  clinical observations, patient history, and epidemiological information. The expected result is Negative.  Fact Sheet for Patients:  BloggerCourse.com  Fact Sheet for  Healthcare Providers:  SeriousBroker.it  This test is no t yet approved or cleared by the [place]  FDA and  has been authorized for detection and/or diagnosis of SARS-CoV-2 by FDA under an Emergency Use Authorization (EUA). This EUA will remain  in effect (meaning this test can be used) for the duration of the COVID-19 declaration under Section 564(b)(1) of the Act, 21 U.S.C.section 360bbb-3(b)(1), unless the authorization is terminated  or revoked sooner.       Influenza A by PCR NEGATIVE NEGATIVE   Influenza B by PCR NEGATIVE NEGATIVE    Comment: (NOTE) The Xpert Xpress SARS-CoV-2/FLU/RSV plus assay is intended as an aid in the diagnosis of influenza from Nasopharyngeal swab specimens and should not be used as a sole basis for treatment. Nasal washings and aspirates are unacceptable for Xpert Xpress SARS-CoV-2/FLU/RSV testing.  Fact Sheet for Patients: BloggerCourse.com  Fact Sheet for Healthcare Providers: SeriousBroker.it  This test is not yet approved or cleared by the [place]  FDA and has been authorized for detection and/or diagnosis of SARS-CoV-2 by FDA under an Emergency Use Authorization (EUA). This EUA will remain in effect (meaning this test can be used) for the duration of the COVID-19 declaration under Section 564(b)(1) of the Act, 21 U.S.C. section 360bbb-3(b)(1), unless the authorization is terminated or revoked.     Resp Syncytial Virus by PCR NEGATIVE NEGATIVE    Comment: (NOTE) Fact Sheet for Patients: BloggerCourse.com  Fact Sheet for Healthcare Providers: SeriousBroker.it  This test is not yet approved or cleared by the [place]  FDA and has been authorized for detection and/or diagnosis of SARS-CoV-2 by FDA under an Emergency Use Authorization (EUA). This EUA will remain in effect (meaning this test can be used)  for the duration of the COVID-19 declaration under Section 564(b)(1) of the Act, 21 U.S.C. section 360bbb-3(b)(1), unless the authorization is terminated or revoked.  Performed at [facility], [address] [place], [place] [address]   CBG monitoring, ED     Status: None   Collection Time: 12/10/23  6:54 PM  Result Value Ref Range   Glucose-Capillary 79 70 - 99 mg/dL    Comment: Glucose reference range applies only to samples taken after fasting for at least 8 hours.  CBG monitoring, ED     Status: Abnormal   Collection Time: 12/10/23  7:57 PM  Result Value Ref Range   Glucose-Capillary 101 (H) 70 - 99 mg/dL    Comment: Glucose reference range applies only to samples taken after fasting for at least 8 hours.  I-Stat Lactic Acid, ED     Status: None   Collection Time: 12/10/23  8:13 PM  Result Value Ref Range   Lactic Acid, Venous 0.8 0.5 - 1.9 mmol/L  CBG monitoring, ED     Status: None   Collection Time: 12/10/23 11:33 PM  Result Value Ref Range   Glucose-Capillary 79 70 - 99 mg/dL    Comment: Glucose reference range applies only to samples taken after fasting for at least 8 hours.   CT CHEST ABDOMEN PELVIS WO CONTRAST Result Date: 12/10/2023 CLINICAL DATA:  Sepsis possible UTI EXAM: CT CHEST, ABDOMEN AND PELVIS WITHOUT CONTRAST TECHNIQUE: Multidetector CT imaging of the chest, abdomen and pelvis was performed following the standard protocol without IV contrast. RADIATION DOSE REDUCTION: This exam was performed according to the departmental dose-optimization  program which includes automated exposure control, adjustment of the mA and/or kV according to patient size and/or use of iterative reconstruction technique. COMPARISON:  Chest x-ray 12/10/2023, CT 01/10/2023, chest CT 07/02/2022, hip CT 07/11/2022, 06/08/2021 FINDINGS: CT CHEST FINDINGS Cardiovascular: Limited without intravenous contrast. Mild aortic atherosclerosis. No aneurysm. Multi vessel coronary  vascular calcification. Cardiomegaly. No pericardial effusion. Mediastinum/Nodes: Patent trachea no thyroid  mass. No suspicious lymph nodes. Moderate large hiatal hernia. Lungs/Pleura: Mild diffuse bilateral mosaic density likely due to airways disease. There is diffuse bilateral bronchial wall thickening with narrowed appearance of airways. Patchy mucous plugging most evident in the lower lobes. Scattered small foci of irregular consolidation and nodularity within the bilateral lower lobes. Bandlike scarring or atelectasis in both lower lobes. No sizable pleural effusion Musculoskeletal: Sternum appears intact. Multilevel degenerative changes. No acute osseous abnormality. CT ABDOMEN PELVIS FINDINGS Hepatobiliary: No focal liver abnormality is seen. Status post cholecystectomy. No biliary dilatation. Pancreas: Unremarkable. No pancreatic ductal dilatation or surrounding inflammatory changes. Spleen: Normal in size without focal abnormality. Adrenals/Urinary Tract: Adrenal glands are normal. Kidneys show no hydronephrosis. Foley catheter in the bladder which is decompressed. Right kidney appears slightly atrophic. Stomach/Bowel: Stomach within normal limits aside from hiatal hernia which contains probable pill fragments. No dilated small bowel. Diverticular disease of the colon. Difficult to exclude subtle fat stranding and minimal wall thickening at the descending/proximal sigmoid colon, series 2, image 95. Negative appendix. Vascular/Lymphatic: Aortic atherosclerosis. No enlarged abdominal or pelvic lymph nodes. Reproductive: Uterus and bilateral adnexa are unremarkable. Other: Negative for pelvic effusion or free air. Musculoskeletal: Chronic superior endplate deformities at L1 and L3. suspect transitional anatomy at lumbosacral junction with lumbarized S1 segment. Spinal hardware at the spinous processes of the lower lumbar spine.1 no acute osseous abnormality. Chronic deformity of right hip with evidence of  prior right hip hardware removal and chronic mixed soft tissue and fluid density at the right hip socket. Chronic erosive and remodeling change of the right acetabulum. IMPRESSION: 1. Diffuse bilateral bronchial wall thickening with narrowed appearance of airways consistent with airways inflammatory process and patchy mucous plugging most evident in the lower lobes. Scattered small foci of irregular consolidation and nodularity within the bilateral lower lobes, suspect for multifocal pneumonia or potential aspiration. 2. Diverticular disease of the colon. Difficult to exclude subtle fat stranding and minimal wall thickening at the descending/proximal sigmoid colon, as may be seen with mild diverticulitis. 3. Other chronic findings as described above 4. Aortic atherosclerosis. Aortic Atherosclerosis (ICD10-I70.0). Electronically Signed   By: Luke Bun M.D.   On: 12/10/2023 21:42   DG Chest Port 1 View Result Date: 12/10/2023 CLINICAL DATA:  Insert epic diagnosis and indication. EXAM: PORTABLE CHEST 1 VIEW COMPARISON:  Radiograph 10/13/2022 FINDINGS: Patient is chronically rotated. Stable heart size and mediastinal contours. No focal airspace disease. No pleural effusion or pneumothorax. No acute osseous findings. Chronic shoulder arthropathy. IMPRESSION: No acute chest findings. Electronically Signed   By: Andrea Gasman M.D.   On: 12/10/2023 18:55    Pending Labs Unresulted Labs (From admission, onward)     Start     Ordered   12/10/23 1827  Urine Culture  Once,   R        12/10/23 1827   12/10/23 1813  Blood Culture (routine x 2)  (Septic presentation on arrival (screening labs, nursing and treatment orders for obvious sepsis))  BLOOD CULTURE X 2,   STAT      12/10/23 1815  Vitals/Pain Today's Vitals   12/10/23 2200 12/10/23 2216 12/10/23 2230 12/10/23 2300  BP: 132/65  131/64 139/67  Pulse: 67  70 67  Resp: 10  11 13   Temp:      TempSrc:      SpO2: 92%  95% 96%   Weight:      Height:      PainSc:  0-No pain      Isolation Precautions No active isolations  Medications Medications  lactated ringers  infusion ( Intravenous New Bag/Given 12/10/23 1859)  aztreonam  (AZACTAM ) 2 g in sodium chloride  0.9 % 100 mL IVPB (0 g Intravenous Stopped 12/10/23 1958)  metroNIDAZOLE  (FLAGYL ) IVPB 500 mg (0 mg Intravenous Stopped 12/10/23 2216)  vancomycin  (VANCOCIN ) IVPB 1000 mg/200 mL premix (0 mg Intravenous Stopped 12/10/23 2320)    Mobility non-ambulatory

## 2023-12-10 NOTE — ED Triage Notes (Addendum)
 Patient BIB [facility] from [facility]. Patients husband requested patient to be sent out because she get UTI's a lot. Patient has no complaints. Nursing staff at facility said they would do a urine test tomorrow, but family did not want to wait. Patient denies increased frequency or burning urination.

## 2023-12-10 NOTE — Sepsis Progress Note (Signed)
 Sepsis protocol is being followed by [facility].

## 2023-12-10 NOTE — ED Provider Notes (Signed)
**Note Madison-Identified via Obfuscation**  [place] EMERGENCY DEPARTMENT AT [facility] Provider Note   CSN: [id number] Arrival date & time: 12/10/23  1737     Patient presents with: Possible UTI   Madison [name] is a 83 y.o. female brought in by EMS for altered mental status.  History is given by the patient's husband.  He reports that she has a history of recurrent urinary tract infections.  Previously she had a urinary tract infection that tracked into her right prosthetic hip.  She had an abscess in the joint and had to have the hip removed in 2022.  Due to this she is unable to ambulate.  She lives at friend's home [place] and he states that this past Wednesday they were getting her onto the toilet with a [facility] lift and when they brought her off they could not get the [place] lift down.  She had onset of back spasms after that was treated with tramadol.  Shortly after discontinuing the tramadol her husband noted that she became more confused that was in the last 2 days.  She has had progressive worsening of confusion.  He states this is how she usually acts when she has a urinary tract infection.  Patient denies any symptoms.  She is alert to person place and time.  She is disoriented to event.  She has no complaints at this time.  Review of past medical history shows mild cognitive decline history.  Review of micro reports she has previous urine infections with E. coli with multiple resistances.   HPI     Prior to Admission medications   Medication Sig Start Date End Date Taking? Authorizing Provider  acetaminophen  (TYLENOL ) 325 MG tablet Take 650 mg by mouth every 4 (four) hours as needed. 2 tablet by mouth every 4 hours as needed for fever and pain.    [provider]  albuterol  (VENTOLIN  HFA) 108 (90 Base) MCG/ACT inhaler Inhale 2 puffs into the lungs every 6 (six) hours as needed for wheezing or shortness of breath.    [provider]  bifidobacterium infantis (ALIGN) capsule Take 1 capsule by mouth  daily.    [provider]  Calcium Carb-Cholecalciferol  (CALCIUM PLUS VITAMIN D3 PO) Take 1 tablet by mouth daily. Vitamin D3 20mcg + Calcium 600    [provider]  Cholecalciferol  (VITAMIN D3) 50 MCG (2000 UT) TABS Take 2 tablets by mouth daily. Patient not taking: Reported on 12/04/2023    [provider]  Cranberry 500 MG TABS Take 500 mg by mouth in the morning and at bedtime.    [provider]  docusate sodium  (COLACE) 100 MG capsule Take 100 mg by mouth daily. Give 100 mg by mouth every 24 hours as needed for constipation    [provider]  ferrous sulfate  325 (65 FE) MG EC tablet Take 325 mg by mouth daily.    [provider]  furosemide  (LASIX ) 20 MG tablet Take 20 mg by mouth daily.    [provider]  guaiFENesin -dextromethorphan (ROBITUSSIN DM) 100-10 MG/5ML syrup Take 10 mLs by mouth every 4 (four) hours as needed for cough.    [provider]  hydrocortisone cream 1 % Apply 1 Application topically at bedtime. Apply to urogenital area topically at bedtime every other day for itchy areas related to vaginitis.    [provider]  ipratropium-albuterol  (DUONEB) 0.5-2.5 (3) MG/3ML SOLN Inhale 3 mLs into the lungs every 6 (six) hours as needed (wheezing). And every 8 hours as  needed per [facility]    [provider]  ketoconazole (NIZORAL) 2 % cream Apply 1 Application topically 2 (two) times daily.    [provider]  levothyroxine  (SYNTHROID ) 100 MCG tablet Take 100 mcg by mouth daily before breakfast.    [provider]  loperamide (IMODIUM) 2 MG capsule Take 2 mg by mouth every 4 (four) hours as needed for diarrhea or loose stools. 02/04/21   [provider]  loratadine  (CLARITIN ) 10 MG tablet Take 10 mg by mouth daily.    [provider]  nystatin (MYCOSTATIN/NYSTOP) powder Apply 1 application  topically in the morning, at noon, in the evening, and at  bedtime. To genital areas and inner thigh    [provider]  ondansetron  (ZOFRAN ) 4 MG tablet Take 4 mg by mouth every 8 (eight) hours as needed for nausea or vomiting.    [provider]  pantoprazole  (PROTONIX ) 40 MG tablet Take 40 mg by mouth daily.    [provider]  potassium chloride  (KLOR-CON  M) 10 MEQ tablet Take 3 tablets by mouth daily. 05/01/23   [provider]  traMADol (ULTRAM) 50 MG tablet Take 50 mg by mouth every 6 (six) hours as needed. 06/09/23   [provider]  triamcinolone cream (KENALOG) 0.5 % Apply 1 Application topically 2 (two) times daily.    [provider]    Allergies: Ciprofloxacin , Keflex [cephalexin], Macrobid [nitrofurantoin], Amoxicillin, and Cefadroxil     Review of Systems  Updated Vital Signs BP (!) 114/41   Pulse 64   Temp (!) 94.9 F (34.9 C) (Rectal)   Resp 19   Ht 5' 8 (1.727 m)   Wt 132 kg   SpO2 95%   BMI 44.25 kg/m   Physical Exam Vitals and nursing note reviewed.  Constitutional:      General: She is not in acute distress.    Appearance: She is well-developed. She is obese. She is not diaphoretic.     Comments: Flushed cheeks  HENT:     Head: Normocephalic and atraumatic.     Right Ear: External ear normal.     Left Ear: External ear normal.     Nose: Nose normal.     Mouth/Throat:     Mouth: Mucous membranes are moist.  Eyes:     General: No scleral icterus.    Conjunctiva/sclera: Conjunctivae normal.  Cardiovascular:     Rate and Rhythm: Normal rate and regular rhythm.     Heart sounds: Normal heart sounds. No murmur heard.    No friction rub. No gallop.  Pulmonary:     Effort: Pulmonary effort is normal. No respiratory distress.     Breath sounds: Normal breath sounds.  Abdominal:     General: Bowel sounds are normal. There is no distension.     Palpations: Abdomen is soft. There is no mass.     Tenderness: There is no abdominal tenderness. There is no guarding.   Musculoskeletal:     Cervical back: Normal range of motion.  Skin:    General: Skin is warm and dry.  Neurological:     Mental Status: She is alert and oriented to person, place, and time.  Psychiatric:        Behavior: Behavior normal.     (all labs ordered are listed, but only abnormal results are displayed) Labs Reviewed  CBG MONITORING, ED - Abnormal; Notable for the following components:      Result Value   Glucose-Capillary  69 (*)    All other components within normal limits  I-STAT CG4 LACTIC ACID, ED - Abnormal; Notable for the following components:   Lactic Acid, Venous 0.4 (*)    All other components within normal limits  RESP PANEL BY RT-PCR (RSV, FLU A&B, COVID)  RVPGX2  CULTURE, BLOOD (ROUTINE X 2)  CULTURE, BLOOD (ROUTINE X 2)  COMPREHENSIVE METABOLIC PANEL WITH GFR  CBC WITH DIFFERENTIAL/PLATELET  PROTIME-INR  URINALYSIS, W/ REFLEX TO CULTURE (INFECTION SUSPECTED)    EKG: None  Radiology: No results found.   .Critical Care  Performed by: Arloa Chroman, PA-C Authorized by: Arloa Chroman, PA-C   Critical care provider statement:    Critical care time (minutes):  76   Critical care time was exclusive of:  Separately billable procedures and treating other patients   Critical care was necessary to treat or prevent imminent or life-threatening deterioration of the following conditions: hypothermia.   Critical care was time spent personally by me on the following activities:  Development of treatment plan with patient or surrogate, discussions with consultants, evaluation of patient's response to treatment, examination of patient, ordering and review of laboratory studies, ordering and review of radiographic studies, ordering and performing treatments and interventions, pulse oximetry, re-evaluation of patient's condition, review of old charts, obtaining history from patient or surrogate and interpretation of cardiac output measurements    Medications  Ordered in the ED  lactated ringers  infusion (has no administration in time range)  aztreonam  (AZACTAM ) 2 g in sodium chloride  0.9 % 100 mL IVPB (has no administration in time range)  metroNIDAZOLE  (FLAGYL ) IVPB 500 mg (has no administration in time range)  vancomycin  (VANCOCIN ) IVPB 1000 mg/200 mL premix (has no administration in time range)    Clinical Course as of 12/14/23 1317  Sun Dec 10, 2023  8162 Patient has rectal temperature 94.9.  CBG 69 however I do not think that her glucose is low enough to cause hypothermia.  Suspect sepsis.  Code sepsis workup initiated.  Patient will be given warm fluids and placed on Bair hugger. + broad spectrum antibiotics  [AH]  1935 Potassium(!): 5.7 [AH]  [address]): MODERATE [AH]  1935 Bacteria, UA(!): FEW [AH]  1945 Lactic Acid, Venous(!): 0.4 [AH]  1945 WBC: 5.4 [AH]  1945 Platelets(!): 142 [AH]    Clinical Course User Index [AH] Arloa Chroman, PA-C                                 Medical Decision Making This patient presents to the ED for concern of AMS, this involves an extensive number of treatment options, and is a complaint that carries with it a high risk of complications and morbidity.  The differential diagnosis for AMS is extensive and includes, but is not limited to: drug overdose - opioids, alcohol , sedatives, antipsychotics, drug withdrawal, others; Metabolic: hypoxia, hypoglycemia, hyperglycemia, hypercalcemia, hypernatremia, hyponatremia, uremia, hepatic encephalopathy, hypothyroidism, hyperthyroidism, vitamin B12 or thiamine deficiency, carbon monoxide poisoning, Wilson's disease, Lactic acidosis, DKA/HHOS; Infectious: meningitis, encephalitis, bacteremia/sepsis, urinary tract infection, pneumonia, neurosyphilis; Structural: Space-occupying lesion, (brain tumor, subdural hematoma, hydrocephalus,); Vascular: stroke, subarachnoid hemorrhage, coronary ischemia, hypertensive encephalopathy, CNS vasculitis, thrombotic  thrombocytopenic purpura, disseminated intravascular coagulation, hyperviscosity; Psychiatric: Schizophrenia, depression; Other: Seizure, hypothermia, heat stroke, ICU psychosis, dementia -sundowning.  Patient notably hypothermic. Although cbg 69- not low enough to cause hypothermia, Code sepsis initiated  Co morbidities:        has a past medical  history of Benign hypertension with chronic kidney disease, stage III (HCC), Cardiac disease (03/10/2014), Chronic diarrhea (07/27/2015), Edema (07/27/2015), Ejection fraction (2004), Gastroesophageal reflux disease, GERD (gastroesophageal reflux disease), Heart disease (03/10/2014), Hypertension, Left bundle branch block (09/01/2020), Obesity, Obesity (BMI 30-39.9) (09/25/2017), OSA (obstructive sleep apnea), Preop cardiovascular exam (11/2010), Sleep apnea (03/10/2014), Supraventricular tachycardia (HCC), SVT (supraventricular tachycardia) (HCC), Thyroid  disease, and Urinary incontinence.   Social Determinants of Health:        SDOH Screenings Food Insecurity: No Food Insecurity (12/11/2023) Housing: Low Risk  (12/11/2023) Transportation Needs: No Transportation Needs (12/11/2023) Utilities: Not At Risk (12/11/2023) Depression (PHQ2-9): Low Risk  (02/21/2023) Financial Resource Strain: Low Risk  (01/16/2021)     Received from [facility] Social Connections: Socially Isolated (12/11/2023) Stress: No Stress Concern Present (01/13/2021)     Received from [facility] Tobacco Use: Medium Risk (12/10/2023)   Additional history:  {Additional history obtained from HUSBAND AT BEDSIDE {External records from outside source obtained and reviewed including previous admission documents  Lab Tests:  I Ordered, and personally interpreted labs.  The pertinent results include:   CBG 69  PT/inr wnl Wbc wnl Hgb 10.6- baseline BUN/ cr 35/2.11 baseline K 5.7 slightly > normal Urine appears infected (cath sample) Imaging Studies:  I ordered imaging  studies including CXR CT Ch/Ab/pv I independently visualized and interpreted imaging which showed neg xr- infiltrates suggestive of cap I agree with the radiologist interpretation  Cardiac Monitoring/ECG:       The patient was maintained on a cardiac monitor.  I personally viewed and interpreted the cardiac monitored which showed an underlying rhythm of:    EKG Interpretation Nsr rate of 65  Medicines ordered and prescription drug management:  I ordered medication including Medications  aztreonam  (AZACTAM ) 2 g in sodium chloride  0.9 % 100 mL IVPB (0 g Intravenous Stopped 12/10/23 1958) metroNIDAZOLE  (FLAGYL ) IVPB 500 mg (0 mg Intravenous Stopped 12/10/23 2216) vancomycin  (VANCOCIN ) IVPB 1000 mg/200 mL premix (0 mg Intravenous Stopped 12/10/23 2320)  for presumed sepsis Reevaluation of the patient after these medicines showed that the patient improved I have reviewed the patients home medicines and have made adjustments as needed  Test Considered:         Critical Interventions:       warming with bair hugger, broad spectrum abs  Consultations Obtained: Hopsitalist for admission  Problem List / ED Course:       (J18.9) Multifocal pneumonia  (primary encounter diagnosis)  (T68.XXXA) Hypothermia, initial encounter   MDM: patient here w altered mental status likely multifactorial with both CAP/ UTI and hypothermia.     Dispostion:  After consideration of the diagnostic results and the patients response to treatment, I feel that the patent would benefit from admission.    Amount and/or Complexity of Data Reviewed Labs: ordered. Decision-making details documented in ED Course. Radiology: ordered.  Risk Prescription drug management. Decision regarding hospitalization.        Final diagnoses:  None    ED Discharge Orders     None          Arloa Chroman, PA-C 12/14/23 1408    Dean Clarity, MD 12/14/23 1504

## 2023-12-11 DIAGNOSIS — J189 Pneumonia, unspecified organism: Secondary | ICD-10-CM | POA: Diagnosis not present

## 2023-12-11 DIAGNOSIS — T68XXXA Hypothermia, initial encounter: Secondary | ICD-10-CM | POA: Diagnosis not present

## 2023-12-11 DIAGNOSIS — I5032 Chronic diastolic (congestive) heart failure: Secondary | ICD-10-CM

## 2023-12-11 DIAGNOSIS — N184 Chronic kidney disease, stage 4 (severe): Secondary | ICD-10-CM | POA: Diagnosis not present

## 2023-12-11 LAB — CBC
HCT: 33.9 % — ABNORMAL LOW (ref 36.0–46.0)
Hemoglobin: 9.9 g/dL — ABNORMAL LOW (ref 12.0–15.0)
MCH: 26.9 pg (ref 26.0–34.0)
MCHC: 29.2 g/dL — ABNORMAL LOW (ref 30.0–36.0)
MCV: 92.1 fL (ref 80.0–100.0)
Platelets: 128 K/uL — ABNORMAL LOW (ref 150–400)
RBC: 3.68 MIL/uL — ABNORMAL LOW (ref 3.87–5.11)
RDW: 14.9 % (ref 11.5–15.5)
WBC: 5.8 K/uL (ref 4.0–10.5)
nRBC: 0 % (ref 0.0–0.2)

## 2023-12-11 LAB — BASIC METABOLIC PANEL WITH GFR
Anion gap: 6 (ref 5–15)
BUN: 31 mg/dL — ABNORMAL HIGH (ref 8–23)
CO2: 17 mmol/L — ABNORMAL LOW (ref 22–32)
Calcium: 8.5 mg/dL — ABNORMAL LOW (ref 8.9–10.3)
Chloride: 108 mmol/L (ref 98–111)
Creatinine, Ser: 1.98 mg/dL — ABNORMAL HIGH (ref 0.44–1.00)
GFR, Estimated: 25 mL/min — ABNORMAL LOW (ref >60)
Glucose, Bld: 76 mg/dL (ref 70–99)
Potassium: 4.8 mmol/L (ref 3.5–5.1)
Sodium: 131 mmol/L — ABNORMAL LOW (ref 135–145)

## 2023-12-11 MED ORDER — FUROSEMIDE 20 MG PO TABS
20.0000 mg | ORAL_TABLET | Freq: Every day | ORAL | Status: DC
  Administered 2023-12-11 – 2023-12-14 (×4): 20 mg via ORAL
  Filled 2023-12-11 (×3): qty 1

## 2023-12-11 MED ORDER — CHLORHEXIDINE GLUCONATE CLOTH 2 % EX PADS
6.0000 | MEDICATED_PAD | Freq: Every day | CUTANEOUS | Status: DC
  Administered 2023-12-11 – 2023-12-15 (×5): 6 via TOPICAL

## 2023-12-11 MED ORDER — ONDANSETRON HCL 4 MG PO TABS: 4.0000 mg | ORAL_TABLET | Freq: Four times a day (QID) | ORAL | Status: DC | PRN

## 2023-12-11 MED ORDER — ALBUTEROL SULFATE (2.5 MG/3ML) 0.083% IN NEBU: 2.5000 mg | INHALATION_SOLUTION | Freq: Four times a day (QID) | RESPIRATORY_TRACT | Status: DC | PRN

## 2023-12-11 MED ORDER — PIPERACILLIN-TAZOBACTAM 3.375 G IVPB
3.3750 g | Freq: Three times a day (TID) | INTRAVENOUS | Status: DC
  Administered 2023-12-11 (×2): 3.375 g via INTRAVENOUS
  Filled 2023-12-11 (×2): qty 50

## 2023-12-11 MED ORDER — DOCUSATE SODIUM 100 MG PO CAPS
100.0000 mg | ORAL_CAPSULE | Freq: Every day | ORAL | Status: DC
Start: 2023-12-11 — End: 2023-12-17
  Administered 2023-12-11 – 2023-12-17 (×5): 100 mg via ORAL
  Filled 2023-12-11 (×7): qty 1

## 2023-12-11 MED ORDER — HEPARIN SODIUM (PORCINE) 5000 UNIT/ML IJ SOLN
5000.0000 [IU] | Freq: Three times a day (TID) | INTRAMUSCULAR | Status: DC
  Administered 2023-12-11 – 2023-12-17 (×19): 5000 [IU] via SUBCUTANEOUS
  Filled 2023-12-11 (×19): qty 1

## 2023-12-11 MED ORDER — ONDANSETRON HCL 4 MG/2ML IJ SOLN: 4.0000 mg | Freq: Four times a day (QID) | INTRAMUSCULAR | Status: DC | PRN

## 2023-12-11 MED ORDER — POLYETHYLENE GLYCOL 3350 17 G PO PACK: 17.0000 g | PACK | Freq: Every day | ORAL | Status: DC | PRN

## 2023-12-11 MED ORDER — ACETAMINOPHEN 325 MG PO TABS: 650.0000 mg | ORAL_TABLET | Freq: Four times a day (QID) | ORAL | Status: DC | PRN

## 2023-12-11 MED ORDER — ACETAMINOPHEN 650 MG RE SUPP: 650.0000 mg | Freq: Four times a day (QID) | RECTAL | Status: DC | PRN

## 2023-12-11 MED ORDER — PANTOPRAZOLE SODIUM 40 MG PO TBEC
40.0000 mg | DELAYED_RELEASE_TABLET | Freq: Every day | ORAL | Status: DC
Start: 2023-12-11 — End: 2023-12-17
  Administered 2023-12-11 – 2023-12-17 (×7): 40 mg via ORAL
  Filled 2023-12-11 (×7): qty 1

## 2023-12-11 MED ORDER — ALBUTEROL SULFATE HFA 108 (90 BASE) MCG/ACT IN AERS: 2.0000 | INHALATION_SPRAY | Freq: Four times a day (QID) | RESPIRATORY_TRACT | Status: DC | PRN

## 2023-12-11 MED ORDER — LEVOTHYROXINE SODIUM 100 MCG PO TABS
100.0000 ug | ORAL_TABLET | Freq: Every day | ORAL | Status: DC
  Administered 2023-12-11 – 2023-12-17 (×7): 100 ug via ORAL
  Filled 2023-12-11 (×7): qty 1

## 2023-12-11 MED ORDER — SODIUM CHLORIDE 0.9% FLUSH
3.0000 mL | Freq: Two times a day (BID) | INTRAVENOUS | Status: DC
  Administered 2023-12-14 – 2023-12-17 (×7): 3 mL via INTRAVENOUS

## 2023-12-11 MED ORDER — SODIUM CHLORIDE 0.9 % IV SOLN
3.0000 g | Freq: Four times a day (QID) | INTRAVENOUS | Status: DC
  Administered 2023-12-11 – 2023-12-14 (×11): 3 g via INTRAVENOUS
  Filled 2023-12-11 (×12): qty 8

## 2023-12-11 NOTE — Plan of Care (Signed)

## 2023-12-11 NOTE — Progress Notes (Signed)
  Progress Note   Patient: Madison [name] FMW:[id number] DOB: [birth year] DOA: 12/10/2023     1 DOS: the patient was seen and examined on 12/11/2023   Brief hospital course: 83 year old woman complex PMH presented for altered mental status, treated with a short course of tramadol prior to admission, became confused, husband concerned about UTI.  Recent episode of vomiting after eating lunch.  Imaging revealed multifocal pneumonia or potential aspiration and she was admitted for further evaluation.  In the emergency department rectal temperature 94.9, started on warming blanket.   Consultants None   Procedures/Events None   Assessment and Plan: Multifocal pneumonia Aspiration Suspect aspiration pneumonia.  Currently saturating well on room air.  Can narrow to Unasyn . Diet per speech therapy   Hypothermia Resolved, treated with warming blanket. Possibly environmental in nature within the facility (obviously not outside in hot weather) No signs or symptoms of sepsis   Hyperkalemia--resolved Give Lokelma , continue home Lasix .  Hold potassium supplement.  Normocytic anemia Acute thrombocytopenia Hemoglobin stable, appears to be close to baseline. Platelets low probably related to acute infection.   CKD stage IV: Creatinine appears stable, baseline around 1.9-2.0.   Chronic HFpEF: Appears compensated.  She has chronic dependent edema.  Continue home oral Lasix     Hypothyroidism: Continue Synthroid .   OSA: She is not using CPAP at her facility.   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  Continue fall precautions.      Subjective:  Has some itching bilateral arms Otherwise seems to feel good  Physical Exam: Vitals:   12/11/23 0851 12/11/23 0902 12/11/23 1255 12/11/23 1331  BP: (!) 136/37 (!) 117/38 (!) 96/39 (!) 102/47  Pulse: 72 73 71 74  Resp: 12 13 15 17   Temp: 97.8 F (36.6 C)   97.6 F (36.4 C)   TempSrc: Oral  Oral   SpO2: 94% 93% 95% 96%  Weight:      Height:       Physical Exam Vitals reviewed.  Constitutional:      General: She is not in acute distress.    Appearance: She is not ill-appearing or toxic-appearing.  Cardiovascular:     Rate and Rhythm: Normal rate and regular rhythm.     Heart sounds: No murmur heard. Pulmonary:     Effort: Pulmonary effort is normal. No respiratory distress.     Breath sounds: No wheezing, rhonchi or rales.  Neurological:     Mental Status: She is alert.  Psychiatric:        Mood and Affect: Mood normal.        Behavior: Behavior normal.     Data Reviewed: Sodium 131 Creatinine 1.98 appears to be baseline Hemoglobin 9.9, stable CT chest showed bilateral lobe abnormality suspicious for multifocal pneumonia potential aspiration, aortic atherosclerosis.  Family Communication: husband at bedside  Disposition: Status is: Inpatient Remains inpatient appropriate because: aspiration     Time spent: 35 minutes  Author: Toribio Door, MD 12/11/2023 4:47 PM  For on call review [email or web address].

## 2023-12-11 NOTE — TOC Initial Note (Signed)
 Transition of Care [facility]) - Initial/Assessment Note    Patient Details  Name: Madison [name] MRN: [id number] Date of Birth: August 06, 1940  [facility] of [facility]) CM/SW Contact:    Bascom Service, RN Phone Number: 12/11/2023, 4:04 PM  Clinical Narrative: Patient from [facility] rep Izetta Pebbles will accept her back once stable. PTAR @ d/c.                  Expected Discharge Plan: Long Term Nursing Home Barriers to Discharge: Continued Medical Work up   Patient Goals and CMS Choice Patient states their goals for this hospitalization and ongoing recovery are:: [facility] CMS Medicare.gov Compare Post Acute Care list provided to:: Patient Choice offered to / list presented to : Patient [place] ownership interest in [employer].provided to:: Patient    Expected Discharge Plan and Services     Post Acute Care Choice: Nursing Home                                        Prior Living Arrangements/Services                       Activities of Daily Living   ADL Screening (condition at time of admission) Independently performs ADLs?: No Does the patient have a NEW difficulty with bathing/dressing/toileting/self-feeding that is expected to last >3 days?: No Does the patient have a NEW difficulty with getting in/out of bed, walking, or climbing stairs that is expected to last >3 days?: No Does the patient have a NEW difficulty with communication that is expected to last >3 days?: No Is the patient deaf or have difficulty hearing?: No Does the patient have difficulty seeing, even when wearing glasses/contacts?: No Does the patient have difficulty concentrating, remembering, or making decisions?: No  Permission Sought/Granted                  Emotional Assessment              Admission diagnosis:  Hypothermia, initial encounter [T68.XXXA] Multifocal pneumonia [J18.9] Patient Active Problem List   Diagnosis Date  Noted   Hypothermia 12/10/2023   Hyperkalemia 12/10/2023   Seasonal allergies 03/28/2023   MCI (mild cognitive impairment) with memory loss 02/22/2023   Pruritic condition 01/18/2023   Yeast vaginitis 01/18/2023   Acute diverticulitis 01/10/2023   Lower back pain 01/05/2023   Chronic wound 11/17/2022   AKI (acute kidney injury) (HCC) 10/14/2022   Acute kidney injury (HCC) 10/13/2022   Acute kidney injury superimposed on chronic kidney disease (HCC) 10/13/2022   Muscle spasm 09/05/2022   Medication management 07/27/2022   Cellulitis of left thigh 07/27/2022   Cutaneous abscess of right hip 07/21/2022   Hyponatremia 07/21/2022   Vaginal bleeding 07/20/2022   Multifocal pneumonia 07/08/2022   Leukocytosis 07/08/2022   Acute on chronic diastolic CHF (congestive heart failure) (HCC) 07/08/2022   Chronic anemia 07/08/2022   Infected abrasion of right hip, initial encounter 07/03/2022   Cellulitis of right hip 07/03/2022   Atypical chest pain 07/03/2022   Chronic heart failure with preserved ejection fraction (HFpEF) (HCC) 07/03/2022   Weight gain 06/27/2022   Malaise and fatigue 06/27/2022   COVID-19 virus infection 05/16/2022   Ingrown left greater toenail 04/12/2022   Dysuria 04/12/2022   Absence of hip joint, 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   Gait disorder 01/27/2022   Pressure  ulcer, stage 2 (HCC) 07/12/2021   Candidiasis of skin 04/13/2021   UTI (urinary tract infection) 04/02/2021   Hypokalemia 02/26/2021   Pressure ulcer of thigh, stage 2 (HCC) 02/19/2021   Elevated liver enzymes 02/16/2021   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   Right hip pain 02/05/2021   Sepsis (HCC) 02/05/2021   Blood loss anemia 02/05/2021   Rheumatoid arthritis (HCC) 02/05/2021   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   Hypothyroidism 02/05/2021   Drug rash 02/05/2021   CKD (chronic kidney disease) stage 4, GFR 15-29 ml/min (HCC) 01/30/2019   Astigmatism with presbyopia,  bilateral 01/22/2019   Blepharitis of both upper and lower eyelid 01/22/2019   Dermatochalasis of both upper eyelids 01/22/2019   Dry eyes, bilateral 01/22/2019   Epiretinal membrane (ERM) of both eyes 01/22/2019   Meibomian gland dysfunction (MGD) of both eyes 01/22/2019   Pseudophakia of both eyes 01/22/2019   Vitreous syneresis of both eyes 01/22/2019   Acute cystitis without hematuria 11/01/2018   E-coli UTI 06/28/2018   Malodorous urine 06/28/2018   Class 3 obesity 09/25/2017   Obstructive sleep apnea    Nasal sore 10/07/2016   Metabolic bone disease 09/01/2015   Vitamin D  deficiency 09/01/2015   Chronic diarrhea 07/27/2015   Edema of lower extremity 11/11/2014   Cardiac disease 03/10/2014   Heart disease 03/10/2014   Encounter for preprocedural cardiovascular examination    Essential hypertension    GERD (gastroesophageal reflux disease)    Adiposity    Supraventricular tachycardia (HCC)    Decreased cardiac output    Urinary incontinence    PCP:  Mast, Man X, NP Pharmacy:   [id number] [place], [place] - [address] [place] [address] Phone: [phone] Fax: [phone]  [facility] - [facility] [address] [place] [address] Phone: [phone] Fax: [phone]     Social Drivers of Health (SDOH) Social History: SDOH Screenings   Food Insecurity: No Food Insecurity (12/11/2023)  Housing: Low Risk  (12/11/2023)  Transportation Needs: No Transportation Needs (12/11/2023)  Utilities: Not At Risk (12/11/2023)  Depression (PHQ2-9): Low Risk  (02/21/2023)  Financial Resource Strain: Low Risk  (01/16/2021)   Received from [facility]  Social Connections: Socially Isolated (12/11/2023)  Stress: No Stress Concern Present (01/13/2021)   Received from [facility]  Tobacco Use: Medium Risk (12/10/2023)   SDOH Interventions:     Readmission Risk Interventions    10/17/2022   12:09 PM  Readmission Risk  Prevention Plan  Transportation Screening Complete  Home Care Screening Complete  Medication Review (RN CM) Complete

## 2023-12-11 NOTE — Evaluation (Signed)
 Clinical/Bedside Swallow Evaluation Patient Details  Name: Madison [name] MRN: [id number] Date of Birth: 1940-11-09  Today's Date: 12/11/2023 Time: SLP Start Time (ACUTE ONLY): 1109 SLP Stop Time (ACUTE ONLY): 1132 SLP Time Calculation (min) (ACUTE ONLY): 23 min  Past Medical History:  Past Medical History:  Diagnosis Date   Benign hypertension with chronic kidney disease, stage III (HCC)    Overview:  Last Assessment & Plan:  Usually the patient is hypertensive. However today her blood pressure is 105 systolic. She has been feeling fatigued with some lightheadedness. Part of this may be related to her lower blood pressure. Her pressure may be improved with her decrease in salt and fluid intake. I've instructed her to put her lisinopril/hctz on hold. 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 It was possibly reentrant. She does well with diltiazem . No change in therapy.   SVT (supraventricular tachycardia) (HCC)    Documented episode in the past, possibly reentrant tachycardia   Thyroid  disease    Urinary incontinence    Past Surgical History:  Past Surgical History:  Procedure Laterality Date   CATARACT EXTRACTION  Left 8/11   CATARACT EXTRACTION Right 3/12   CHOLECYSTECTOMY  1994   Copsilotomy Laser Treatment Left 6/12   eye   ELBOW SURGERY  8/12   EYE SURGERY Left 09/2008   macular hole   EYE SURGERY Right 12/11   Lumbar Infusion  2011   MOLE REMOVAL  06/17/2015   TOTAL KNEE ARTHROPLASTY Left 6/11   TOTAL KNEE ARTHROPLASTY Right 06/13/2011   HPI:  Patient is an  83 y.o. female with PMH: HfpEF, CKD stage IV, hypothyroidism, chronic diarrhea, dysphagia, OSA not on CPAP, MCI, chronic bedbound/wheelchair status, obesity. She resides at a SNF where she has lived for the past 3 years since removal of her right hip joint. She presented to the hospital on 12/10/2023 for evaluation of AMS. Upon presentation at ED, patient's spouse reported concern that she might have a UTI and also reported she had an episode of emesis after eating lunch on Friday (8/15). Patient had barium esophagram in March of 2024 which found: Tortuous distal esophagus, moderate sized hiatal hernia, Moderate esophageal dysmotility with tertiary contractions and retrograde flow of barium bolus to the upper esophagus; no laryngeal vestibular penetration or aspiration.    Assessment / Plan / Recommendation  Clinical Impression  Patient did not present with clinical s/s of oropharyngeal dysphagia as per this bedside swallow evaluation. Based on her history, spouse's report of instances of regurgitation after meals, 2024 esophagram showing dysmotility in esophagus, SLP suspects a primary esophageal dysphagia. SLP recommending initiate full liquids/thin consistency diet and  will follow for ability to advance versus needing objective swallow study. SLP Visit Diagnosis: Dysphagia, unspecified (R13.10)    Aspiration Risk  Mild aspiration risk    Diet Recommendation Thin liquid;Other (Comment) (full liquids)    Liquid Administration via: Cup;Straw Medication Administration: Other (Comment) (as tolerated) Supervision: Patient able to self  feed Compensations: Slow rate;Small sips/bites Postural Changes: Seated upright at 90 degrees;Remain upright for at least 30 minutes after po intake    Other  Recommendations Oral Care Recommendations: Oral care BID     Assistance Recommended at Discharge    Functional Status Assessment Patient has had a recent decline in their functional status and demonstrates the ability to make significant improvements in function in a reasonable and predictable amount of time.  Frequency and Duration min 1 x/week  1 week       Prognosis Prognosis for improved oropharyngeal function: Good      Swallow Study   General Date of Onset: 12/11/23 HPI: Patient is an  83 y.o. female with PMH: HfpEF, CKD stage IV, hypothyroidism, chronic diarrhea, dysphagia, OSA not on CPAP, MCI, chronic bedbound/wheelchair status, obesity. She resides at a SNF where she has lived for the past 3 years since removal of her right hip joint. She presented to the hospital on 12/10/2023 for evaluation of AMS. Upon presentation at ED, patient's spouse reported concern that she might have a UTI and also reported she had an episode of emesis after eating lunch on Friday (8/15). Patient had barium esophagram in March of 2024 which found: Tortuous distal esophagus, moderate sized hiatal hernia, Moderate esophageal dysmotility with tertiary contractions and retrograde flow of barium bolus to the upper esophagus; no laryngeal vestibular penetration or aspiration. Type of Study: Bedside Swallow Evaluation Previous Swallow Assessment: BSE June 2024 Diet Prior to this Study: NPO Temperature Spikes Noted: No Respiratory Status: Room air History of Recent Intubation: No Behavior/Cognition: Alert;Pleasant mood;Cooperative Oral Cavity Assessment: Within Functional Limits Oral Care Completed by SLP: No Oral Cavity - Dentition: Adequate natural dentition Vision: Functional for self-feeding Self-Feeding Abilities: Able to feed self Patient  Positioning: Upright in bed Baseline Vocal Quality: Low vocal intensity;Normal Volitional Swallow: Able to elicit    Oral/Motor/Sensory Function Overall Oral Motor/Sensory Function: Within functional limits   Ice Chips     Thin Liquid Thin Liquid: Within functional limits Presentation: Straw;Self Fed    Nectar Thick     Honey Thick     Puree Puree: Not tested   Solid     Solid: Not tested      Norleen IVAR Blase, MA, CCC-SLP Speech Therapy

## 2023-12-12 DIAGNOSIS — J690 Pneumonitis due to inhalation of food and vomit: Secondary | ICD-10-CM | POA: Insufficient documentation

## 2023-12-12 LAB — CBC
HCT: 38 % (ref 36.0–46.0)
Hemoglobin: 11.1 g/dL — ABNORMAL LOW (ref 12.0–15.0)
MCH: 27.3 pg (ref 26.0–34.0)
MCHC: 29.2 g/dL — ABNORMAL LOW (ref 30.0–36.0)
MCV: 93.4 fL (ref 80.0–100.0)
Platelets: 158 K/uL (ref 150–400)
RBC: 4.07 MIL/uL (ref 3.87–5.11)
RDW: 15.4 % (ref 11.5–15.5)
WBC: 6.8 K/uL (ref 4.0–10.5)
nRBC: 0 % (ref 0.0–0.2)

## 2023-12-12 MED ORDER — HYDROXYZINE HCL 25 MG PO TABS
25.0000 mg | ORAL_TABLET | Freq: Three times a day (TID) | ORAL | Status: DC | PRN
  Administered 2023-12-12 – 2023-12-16 (×3): 25 mg via ORAL
  Filled 2023-12-12 (×4): qty 1

## 2023-12-12 NOTE — Progress Notes (Signed)
  Progress Note   Patient: Madison [name] FMW:[id number] DOB: Jun 24, 1940 DOA: 12/10/2023     2 DOS: the patient was seen and examined on 12/12/2023   Brief hospital course: 83 year old woman complex PMH presented for altered mental status, treated with a short course of tramadol prior to admission, became confused, husband concerned about UTI.  Recent episode of vomiting after eating lunch.  Imaging revealed multifocal pneumonia or potential aspiration and she was admitted for further evaluation.  In the emergency department rectal temperature 94.9, started on warming blanket.  Condition rapidly improved, doing well on antibiotics.  There is generalized erythema of the skin of unclear etiology, present on admission.   Consultants None   Procedures/Events None   Assessment and Plan: Multifocal pneumonia Aspiration Suspect aspiration pneumonia.  Currently saturating well on room air.  Continue Unasyn , likely changed to oral therapy tomorrow. Diet per speech therapy   Hypothermia Resolved, treated with warming blanket. Possibly environmental in nature within the facility (obviously not outside in hot weather) No signs or symptoms of sepsis  Erythema of the skin particularly face and upper body Present prior to admission per husband, etiology unclear.  Some pruritus.  No complicating features noted.   Hyperkalemia--resolved Given Lokelma , continue home Lasix .  Hold potassium supplement.   Normocytic anemia Acute thrombocytopenia, resolved Hemoglobin stable, appears to be close to baseline. Platelets low probably related to acute infection.   CKD stage IV: Creatinine appears stable, baseline around 1.9-2.0.   Chronic HFpEF: Appears compensated.  She has chronic dependent edema.  Continue home oral Lasix     Hypothyroidism: Continue Synthroid .   OSA: She is not using CPAP at her facility.   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  Continue fall precautions.      Subjective:  Feels better Has some itching of the skin  Physical Exam: Vitals:   12/12/23 0500 12/12/23 0523 12/12/23 1021 12/12/23 1350  BP:  (!) 120/52 (!) 123/51 109/66  Pulse:  69 75 81  Resp:  18 18 18   Temp:  (!) 97.4 F (36.3 C) 97.7 F (36.5 C) (!) 97.4 F (36.3 C)  TempSrc:  Oral Oral Oral  SpO2:  99% 99% 97%  Weight: (!) 138.6 kg     Height:       Physical Exam Vitals reviewed.  Constitutional:      General: She is not in acute distress.    Appearance: She is not ill-appearing or toxic-appearing.  Cardiovascular:     Rate and Rhythm: Normal rate and regular rhythm.     Heart sounds: No murmur heard. Pulmonary:     Effort: Pulmonary effort is normal. No respiratory distress.     Breath sounds: No wheezing, rhonchi or rales.  Skin:    Comments: Has generalized erythema of the skin especially in the face and upper chest and arms.  Less so on abdomen and legs.  No lesions noted.  Neurological:     Mental Status: She is alert.  Psychiatric:        Mood and Affect: Mood normal.        Behavior: Behavior normal.     Data Reviewed: Hemoglobin stable UC pending BC no growth to date  Family Communication: husband at bedside  Disposition: Status is: Inpatient Remains inpatient appropriate because: Pneumonia     Time spent: 25 minutes  Author: Toribio Door, MD 12/12/2023 6:07 PM  For on call review [email or web address].

## 2023-12-12 NOTE — Plan of Care (Signed)

## 2023-12-12 NOTE — Progress Notes (Signed)
 Speech Language Pathology Treatment: Dysphagia  Patient Details Name: Madison [name] MRN: [id number] DOB: 1940-10-28 Today's Date: 12/12/2023 Time: 8385-8294 SLP Time Calculation (min) (ACUTE ONLY): 51 min  Assessment / Plan / Recommendation Clinical Impression  Pt seen to aid in determining readiness for dietary advancement. Spouse reports MD advised she would get her diet advanced to soft today. Pt is reportedly much stronger today than earlier in hospital coarse including her voice and ability to self feed. Observed pt consuming graham cracker, applesauce and water. No S/S of aspiration nor dysphagia and swallow clinically judged to be timely. Pt does appear with slight left facial asymmetry which appears to be baseline from examining photo on spouse's phone. Erythema on face/arms, hands noted - which spouse reports is new with this hospital stay. Per spouse, pt has many antibiotic allergies - thus she is limited to options. Pt denies any issues with refluxing - states she was started on a PPI for a few years ago for heartburn.   Suspect if she has aspiration pna, it is due to emesis aspiration and/or dysmotility related. Reviewed general dysmotility precautions/compensations.  Pt admits to coughing and ? Refluxing with HOB reclined after intake - thus advised to stay as upright as can for at least 30 minutes after po intake.   Spouse reports that pt on only a few occasions has verbalize that she feels something in her upper esophagus - but it abates. Pt was seen by Dr Donnald for bowel issues in the past but is not followed by GI currently per their report. Recommend advance to regular/thin diet to allow optimal food choices as spouse reports she is very picky.   Recommend pt order extra gravy/sauces on the side, masticate well and drink liquids during meals.  Advised to start all meals with liquids due to xerostomia. No SLP follow up indicated, Thanks for allowing SLP to assist with pt's care  plan.     HPI HPI: Patient is an  83 y.o. female with PMH: HfpEF, CKD stage IV, hypothyroidism, chronic diarrhea, dysphagia, OSA not on CPAP, MCI, chronic bedbound/wheelchair status, obesity. She resides at a SNF where she has lived for the past 3 years since removal of her right hip joint. She presented to the hospital on 12/10/2023 for evaluation of AMS. Upon presentation at ED, patient's spouse reported concern that she might have a UTI and also reported she had an episode of emesis after eating lunch on Friday (8/15). Patient had barium esophagram in March of 2024 which found: Tortuous distal esophagus, moderate sized hiatal hernia, Moderate esophageal dysmotility with tertiary contractions and retrograde flow of barium bolus to the upper esophagus; no laryngeal vestibular penetration or aspiration.      SLP Plan  All goals met          Recommendations  Diet recommendations: Regular;Thin liquid Liquids provided via: Cup;Straw Medication Administration: Whole meds with liquid Supervision: Patient able to self feed Compensations: Slow rate;Small sips/bites (start intake with liquids) Postural Changes and/or Swallow Maneuvers: Seated upright 90 degrees;Upright 30-60 min after meal                        Dysphagia, unspecified (R13.10)     All goals met   Madison POUR, MS [facility] SLP Acute Rehab Services Office [phone]   Madison [name]  12/12/2023, 7:13 PM

## 2023-12-13 DIAGNOSIS — E875 Hyperkalemia: Secondary | ICD-10-CM | POA: Diagnosis not present

## 2023-12-13 DIAGNOSIS — T68XXXA Hypothermia, initial encounter: Secondary | ICD-10-CM | POA: Diagnosis not present

## 2023-12-13 DIAGNOSIS — N184 Chronic kidney disease, stage 4 (severe): Secondary | ICD-10-CM | POA: Diagnosis not present

## 2023-12-13 DIAGNOSIS — G4733 Obstructive sleep apnea (adult) (pediatric): Secondary | ICD-10-CM

## 2023-12-13 DIAGNOSIS — J690 Pneumonitis due to inhalation of food and vomit: Secondary | ICD-10-CM | POA: Diagnosis not present

## 2023-12-13 LAB — URINE CULTURE: Culture: >=100000 — AB

## 2023-12-13 NOTE — Progress Notes (Signed)
 Progress Note   Patient: Madison [name] FMW:[id number] DOB: [birth year] DOA: 12/10/2023     3 DOS: the patient was seen and examined on 12/13/2023   Brief hospital course: 83 year old woman complex PMH presented for altered mental status, treated with a short course of tramadol prior to admission, became confused, husband concerned about UTI.  Recent episode of vomiting after eating lunch.  Imaging revealed multifocal pneumonia or potential aspiration and she was admitted for further evaluation.  In the emergency department rectal temperature 94.9, started on warming blanket.  Condition rapidly improved, doing well on antibiotics.  There is generalized erythema of the skin of unclear etiology, present on admission.  Assessment and Plan: Multifocal pneumonia Suspect aspiration pneumonia.  Currently saturating well on room air.   Continue Unasyn , likely change to oral therapy before discharge. Continue regular diet per speech therapy with instructions.   Hypothermia Resolved, treated with warming blanket. Possibly environmental in nature within the facility. No signs or symptoms of sepsis   Erythema of the skin particularly face and upper body Present prior to admission per husband, etiology unclear.  Some pruritus.  No complicating features noted. Does note allergy list.   Hyperkalemia--resolved S/p Lokelma , continue home Lasix .  Hold potassium supplement.  Hyponatremia Hypervolemic, continue lasix  Trend Na.   Normocytic anemia Acute thrombocytopenia, resolved Hemoglobin stable, appears to be close to baseline. Platelets low probably related to acute infection.   CKD stage IV: Creatinine appears stable, baseline around 1.9-2.0.   Chronic HFpEF: Appears compensated.  She has chronic dependent edema.  Continue home oral Lasix     Hypothyroidism: Continue Synthroid .   OSA: Not complaint with CPAP at the facility   History of infected right THA s/p resection and Girdlestone  procedure 12/2020: She is chronically bedbound/wheelchair-bound.   Continue fall precautions. Now with foley catheter and may need to go with it.       Encourage Incentive spirometry. Nursing supportive care. Fall, aspiration precautions. Diet:  Diet Orders (From admission, onward)     Start     Ordered   12/12/23 1659  Diet regular Fluid consistency: Thin  Diet effective now       Question:  Fluid consistency:  Answer:  Thin   12/12/23 1658           DVT prophylaxis: heparin  injection 5,000 Units Start: 12/11/23 0600  Level of care: Progressive   Code Status: Full Code  Subjective: Patient is seen and examined today morning.  She is sitting upright eating breakfast. Says she is following SLP instructions. Has some rash over her body.  Physical Exam: Vitals:   12/12/23 2140 12/13/23 0500 12/13/23 0517 12/13/23 1430  BP: 122/75  118/62 (!) 130/59  Pulse: 69  71 76  Resp: 18  17 18   Temp: (!) 97.5 F (36.4 C)  (!) 97.4 F (36.3 C) (!) 97.5 F (36.4 C)  TempSrc: Oral  Oral Oral  SpO2: 99%  98% 98%  Weight:  (!) 139.4 kg    Height:        General - Elderly Caucasian ill female, no apparent distress HEENT - PERRLA, EOMI, atraumatic head, non tender sinuses. Lung - Clear, basal rales, rhonchi, no wheezes. Heart - S1, S2 heard, no murmurs, rubs, 1+ pedal edema. Abdomen - Soft, non tender, diffuse papular rash, bowel sounds good Neuro - Alert, awake and oriented x 3, non focal exam. Skin - Warm and dry.  Data Reviewed:      Latest Ref Rng & Units 12/12/2023  5:32 AM 12/11/2023    4:42 AM 12/10/2023    6:22 PM  CBC  WBC 4.0 - 10.5 K/uL 6.8  5.8  5.4   Hemoglobin 12.0 - 15.0 g/dL 88.8  9.9  89.3   Hematocrit 36.0 - 46.0 % 38.0  33.9  35.7   Platelets 150 - 400 K/uL 158  128  142       Latest Ref Rng & Units 12/11/2023    4:42 AM 12/10/2023    6:22 PM 11/16/2023   12:00 AM  BMP  Glucose 70 - 99 mg/dL 76  82    BUN 8 - 23 mg/dL 31  35  39      Creatinine  0.44 - 1.00 mg/dL 8.01  7.88  1.9      Sodium 135 - 145 mmol/L 131  132  137      Potassium 3.5 - 5.1 mmol/L 4.8  5.7  4.5      Chloride 98 - 111 mmol/L 108  106  111      CO2 22 - 32 mmol/L 17  17  19       Calcium 8.9 - 10.3 mg/dL 8.5  9.0  8.7         This result is from an external source.   No results found.  Family Communication: Discussed with patient, understand and agree. All questions answered.  Disposition: Status is: Inpatient Remains inpatient appropriate because: Severity of illness.  Planned Discharge Destination: Skilled nursing facility     Time spent: 44 minutes  Author: Concepcion Riser, MD 12/13/2023 5:28 PM Secure chat 7am to 7pm For on call review [email or web address].

## 2023-12-13 NOTE — [place] FL2 (Signed)
 [place]  MEDICAID FL2 LEVEL OF CARE FORM     IDENTIFICATION  Patient Name: ROSHUNDA KEIR Birthdate: 21-Mar-1941 Sex: female Admission Date (Current Location): 12/10/2023  [facility] [address] and [place] Number:  Producer, television/film/video and Address:  [facility],  [address] [place] [address]      Provider Number: [id number]  Attending Physician Name and Address:  Darci Pore, MD  Relative Name and Phone Number:  Leara Rawl) [phone]    Current Level of Care: Hospital Recommended Level of Care: Other (Comment) (LTC) Prior Approval Number:    Date Approved/Denied:   PASRR Number: [id number]  Discharge Plan: Other (Comment) (LTC)    Current Diagnoses: Patient Active Problem List   Diagnosis Date Noted   Aspiration pneumonia (HCC) 12/12/2023   Hypothermia 12/10/2023   Hyperkalemia 12/10/2023   Seasonal allergies 03/28/2023   MCI (mild cognitive impairment) with memory loss 02/22/2023   Pruritic condition 01/18/2023   Yeast vaginitis 01/18/2023   Acute diverticulitis 01/10/2023   Lower back pain 01/05/2023   Chronic wound 11/17/2022   AKI (acute kidney injury) (HCC) 10/14/2022   Acute kidney injury (HCC) 10/13/2022   Acute kidney injury superimposed on chronic kidney disease (HCC) 10/13/2022   Muscle spasm 09/05/2022   Medication management 07/27/2022   Cellulitis of left thigh 07/27/2022   Cutaneous abscess of right hip 07/21/2022   Hyponatremia 07/21/2022   Vaginal bleeding 07/20/2022   Multifocal pneumonia 07/08/2022   Leukocytosis 07/08/2022   Acute on chronic diastolic CHF (congestive heart failure) (HCC) 07/08/2022   Chronic anemia 07/08/2022   Infected abrasion of right hip, initial encounter 07/03/2022   Cellulitis of right hip 07/03/2022   Atypical chest pain 07/03/2022   Chronic heart failure with preserved ejection fraction (HFpEF) (HCC) 07/03/2022   Weight gain 06/27/2022   Malaise and fatigue 06/27/2022   COVID-19 virus  infection 05/16/2022   Ingrown left greater toenail 04/12/2022   Dysuria 04/12/2022   Absence of hip joint, 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   Gait disorder 01/27/2022   Pressure ulcer, stage 2 (HCC) 07/12/2021   Candidiasis of skin 04/13/2021   UTI (urinary tract infection) 04/02/2021   Hypokalemia 02/26/2021   Pressure ulcer of thigh, stage 2 (HCC) 02/19/2021   Elevated liver enzymes 02/16/2021   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   Right hip pain 02/05/2021   Sepsis (HCC) 02/05/2021   Blood loss anemia 02/05/2021   Rheumatoid arthritis (HCC) 02/05/2021   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   Hypothyroidism 02/05/2021   Drug rash 02/05/2021   CKD (chronic kidney disease) stage 4, GFR 15-29 ml/min (HCC) 01/30/2019   Astigmatism with presbyopia, bilateral 01/22/2019   Blepharitis of both upper and lower eyelid 01/22/2019   Dermatochalasis of both upper eyelids 01/22/2019   Dry eyes, bilateral 01/22/2019   Epiretinal membrane (ERM) of both eyes 01/22/2019   Meibomian gland dysfunction (MGD) of both eyes 01/22/2019   Pseudophakia of both eyes 01/22/2019   Vitreous syneresis of both eyes 01/22/2019   Acute cystitis without hematuria 11/01/2018   E-coli UTI 06/28/2018   Malodorous urine 06/28/2018   Class 3 obesity 09/25/2017   Obstructive sleep apnea    Nasal sore 10/07/2016   Metabolic bone disease 09/01/2015   Vitamin D  deficiency 09/01/2015   Chronic diarrhea 07/27/2015   Edema of lower extremity 11/11/2014   Cardiac disease 03/10/2014   Heart disease 03/10/2014   Encounter for preprocedural cardiovascular examination    Essential hypertension  GERD (gastroesophageal reflux disease)    Adiposity    Supraventricular tachycardia (HCC)    Decreased cardiac output    Urinary incontinence     Orientation RESPIRATION BLADDER Height & Weight     Self, Time, Situation  Normal Indwelling catheter Weight: (!) 139.4 kg Height:  5' 8 (172.7 cm)   BEHAVIORAL SYMPTOMS/MOOD NEUROLOGICAL BOWEL NUTRITION STATUS      Continent Diet  AMBULATORY STATUS COMMUNICATION OF NEEDS Skin   Extensive Assist Verbally Normal                       Personal Care Assistance Level of Assistance  Feeding, Dressing   Feeding assistance: Limited assistance Dressing Assistance: Maximum assistance     Functional Limitations Info  Speech, Sight, Hearing Sight Info: Impaired (eyeglasses) Hearing Info: Impaired (hearing aids) Speech Info: Impaired (dentures top/bottom)    SPECIAL CARE FACTORS FREQUENCY  PT (By licensed PT), OT (By licensed OT)     PT Frequency: 2s week OT Frequency: 2x week            Contractures Contractures Info: Not present    Additional Factors Info  Code Status, Allergies Code Status Info: Full Allergies Info: Ciprofloxacin , Keflex (Cephalexin), Macrobid (Nitrofurantoin), Amoxicillin, Cefadroxil            Current Medications (12/13/2023):  This is the current hospital active medication list Current Facility-Administered Medications  Medication Dose Route Frequency Provider Last Rate Last Admin   acetaminophen  (TYLENOL ) tablet 650 mg  650 mg Oral Q6H PRN Patel, Vishal R, MD       Or   acetaminophen  (TYLENOL ) suppository 650 mg  650 mg Rectal Q6H PRN Patel, Vishal R, MD       albuterol  (PROVENTIL ) (2.5 MG/3ML) 0.083% nebulizer solution 2.5 mg  2.5 mg Nebulization Q6H PRN Patel, Vishal R, MD       Ampicillin -Sulbactam (UNASYN ) 3 g in sodium chloride  0.9 % 100 mL IVPB  3 g Intravenous Q6H Ellington, Abby K, RPH 200 mL/hr at 12/13/23 1209 3 g at 12/13/23 1209   Chlorhexidine  Gluconate Cloth 2 % PADS 6 each  6 each Topical Q0600 Jadine Toribio SQUIBB, MD   6 each at 12/13/23 0524   docusate sodium  (COLACE) capsule 100 mg  100 mg Oral Daily Patel, Vishal R, MD   100 mg at 12/13/23 0919   furosemide  (LASIX ) tablet 20 mg  20 mg Oral Daily Patel, Vishal R, MD   20 mg at 12/13/23 0919   heparin  injection 5,000 Units  5,000  Units Subcutaneous Q8H Patel, Vishal R, MD   5,000 Units at 12/13/23 9480   hydrOXYzine  (ATARAX ) tablet 25 mg  25 mg Oral TID PRN Jadine Toribio SQUIBB, MD   25 mg at 12/12/23 1321   levothyroxine  (SYNTHROID ) tablet 100 mcg  100 mcg Oral Q0600 Patel, Vishal R, MD   100 mcg at 12/13/23 9480   ondansetron  (ZOFRAN ) tablet 4 mg  4 mg Oral Q6H PRN Patel, Vishal R, MD       Or   ondansetron  (ZOFRAN ) injection 4 mg  4 mg Intravenous Q6H PRN Patel, Vishal R, MD       pantoprazole  (PROTONIX ) EC tablet 40 mg  40 mg Oral Daily Patel, Vishal R, MD   40 mg at 12/13/23 0919   polyethylene glycol (MIRALAX  / GLYCOLAX ) packet 17 g  17 g Oral Daily PRN Patel, Vishal R, MD       sodium chloride  flush (NS) 0.9 % injection  3 mL  3 mL Intravenous Q12H Patel, Vishal R, MD         Discharge Medications: Please see discharge summary for a list of discharge medications.  Relevant Imaging Results:  Relevant Lab Results:   Additional Information SS#[id number]  Reid Regas, Nathanel, RN

## 2023-12-14 DIAGNOSIS — E875 Hyperkalemia: Secondary | ICD-10-CM | POA: Diagnosis not present

## 2023-12-14 DIAGNOSIS — T68XXXA Hypothermia, initial encounter: Secondary | ICD-10-CM | POA: Diagnosis not present

## 2023-12-14 DIAGNOSIS — J690 Pneumonitis due to inhalation of food and vomit: Secondary | ICD-10-CM | POA: Diagnosis not present

## 2023-12-14 DIAGNOSIS — N390 Urinary tract infection, site not specified: Secondary | ICD-10-CM

## 2023-12-14 DIAGNOSIS — N184 Chronic kidney disease, stage 4 (severe): Secondary | ICD-10-CM | POA: Diagnosis not present

## 2023-12-14 DIAGNOSIS — B9689 Other specified bacterial agents as the cause of diseases classified elsewhere: Secondary | ICD-10-CM

## 2023-12-14 LAB — CBC
HCT: 36.6 % (ref 36.0–46.0)
Hemoglobin: 10.7 g/dL — ABNORMAL LOW (ref 12.0–15.0)
MCH: 27.5 pg (ref 26.0–34.0)
MCHC: 29.2 g/dL — ABNORMAL LOW (ref 30.0–36.0)
MCV: 94.1 fL (ref 80.0–100.0)
Platelets: 169 K/uL (ref 150–400)
RBC: 3.89 MIL/uL (ref 3.87–5.11)
RDW: 15.8 % — ABNORMAL HIGH (ref 11.5–15.5)
WBC: 9.1 K/uL (ref 4.0–10.5)
nRBC: 0 % (ref 0.0–0.2)

## 2023-12-14 LAB — BASIC METABOLIC PANEL WITH GFR
Anion gap: 10 (ref 5–15)
BUN: 25 mg/dL — ABNORMAL HIGH (ref 8–23)
CO2: 16 mmol/L — ABNORMAL LOW (ref 22–32)
Calcium: 8.5 mg/dL — ABNORMAL LOW (ref 8.9–10.3)
Chloride: 113 mmol/L — ABNORMAL HIGH (ref 98–111)
Creatinine, Ser: 2.2 mg/dL — ABNORMAL HIGH (ref 0.44–1.00)
GFR, Estimated: 22 mL/min — ABNORMAL LOW (ref >60)
Glucose, Bld: 76 mg/dL (ref 70–99)
Potassium: 3.5 mmol/L (ref 3.5–5.1)
Sodium: 139 mmol/L (ref 135–145)

## 2023-12-14 LAB — MRSA NEXT GEN BY PCR, NASAL: MRSA by PCR Next Gen: NOT DETECTED

## 2023-12-14 MED ORDER — AZITHROMYCIN 500 MG PO TABS: 500.0000 mg | ORAL_TABLET | Freq: Every day | ORAL | Status: DC

## 2023-12-14 MED ORDER — SODIUM CHLORIDE 0.9 % IV SOLN: 3.0000 g | Freq: Two times a day (BID) | INTRAVENOUS | Status: DC

## 2023-12-14 MED ORDER — FUROSEMIDE 40 MG PO TABS
40.0000 mg | ORAL_TABLET | Freq: Every day | ORAL | Status: DC
  Administered 2023-12-15 – 2023-12-16 (×2): 40 mg via ORAL
  Filled 2023-12-14: qty 1
  Filled 2023-12-14: qty 2

## 2023-12-14 MED ORDER — DOXYCYCLINE HYCLATE 100 MG PO TABS
100.0000 mg | ORAL_TABLET | Freq: Two times a day (BID) | ORAL | Status: DC
  Administered 2023-12-14 – 2023-12-17 (×6): 100 mg via ORAL
  Filled 2023-12-14 (×6): qty 1

## 2023-12-14 MED ORDER — ORAL CARE MOUTH RINSE: 15.0000 mL | OROMUCOSAL | Status: DC | PRN

## 2023-12-14 NOTE — Progress Notes (Addendum)
 Progress Note   Patient: Madison [name] FMW:[id number] DOB: 04/27/1940 DOA: 12/10/2023     4 DOS: the patient was seen and examined on 12/14/2023   Brief hospital course: 83 year old woman complex PMH presented for altered mental status, treated with a short course of tramadol prior to admission, became confused, husband concerned about UTI.  Recent episode of vomiting after eating lunch.  Imaging revealed multifocal pneumonia or potential aspiration and she was admitted for further evaluation.  In the emergency department rectal temperature 94.9, started on warming blanket.  Condition rapidly improved, has rash with unasyn . There is generalized erythema of the skin likely due to antibiotic allergy she got outpatient.   Assessment and Plan: Klebsiella UTI Multifocal pneumonia Suspect aspiration pneumonia.  Currently saturating well on room air.   Upon presentation she did receive vanco+azactam +flagyl  subsequently got Unasyn  for 4 days. I stopped Unasyn  as she has rash all over her body (allergies reviewed, she has multiple antibiotic allergies) I will start doxy (she also has allergy to cipro ) Continue bronchodilators PRN. Continue regular diet per speech therapy with instructions.   Hypothermia Resolved, treated with warming blanket. Possibly environmental in nature within the facility. No signs or symptoms of sepsis   Erythema of the skin particularly face and upper body Present prior to admission per husband, etiology unclear.  Some pruritus.  No complicating features noted. Does note allergy list.   Hyperkalemia--resolved S/p Lokelma , continue home Lasix .  Hold potassium supplement.  Hyponatremia Hypervolemic, continue lasix . Sodium improved.   Normocytic anemia Acute thrombocytopenia, resolved Hemoglobin stable, appears to be close to baseline. Platelets low probably related to acute infection.=, improved now.   CKD stage IV: Creatinine appears stable, baseline around  1.9-2.0.   Chronic HFpEF: Appears compensated.  She has chronic dependent edema.   Increased Lasix  to 40mg  daily.   Hypothyroidism: Continue Synthroid .   OSA: Not complaint with CPAP at the facility. Advised outpatient pulmonology follow up for sleep study.   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   Continue fall precautions. Now with foley catheter and may need to go with foley to SNF.     Encourage Incentive spirometry. Nursing supportive care. Fall, aspiration precautions. Diet:  Diet Orders (From admission, onward)     Start     Ordered   12/12/23 1659  Diet regular Fluid consistency: Thin  Diet effective now       Question:  Fluid consistency:  Answer:  Thin   12/12/23 1658           DVT prophylaxis: heparin  injection 5,000 Units Start: 12/11/23 0600  Level of care: Progressive   Code Status: Full Code  Subjective: Patient is seen and examined today morning.  She is weak, lying in bed. Has diffuse red rash all over her body. Husband at bedside.. Has leg edema, states she has multiple loose stools.  Physical Exam: Vitals:   12/13/23 1430 12/13/23 2058 12/14/23 0456 12/14/23 0500  BP: (!) 130/59 (!) 116/57 132/60   Pulse: 76 72 73   Resp: 18 17 16    Temp: (!) 97.5 F (36.4 C) (!) 97.5 F (36.4 C) 98.7 F (37.1 C)   TempSrc: Oral Oral    SpO2: 98% 99% 100%   Weight:    (!) 139.8 kg  Height:        General - Elderly Caucasian ill female, no apparent distress HEENT - PERRLA, EOMI, atraumatic head, non tender sinuses. Lung -  Clear, basal rales, rhonchi, no wheezes. Heart - S1, S2 heard, no murmurs, rubs, 2+ pedal edema. Abdomen - Soft, non tender, diffuse papular rash, bowel sounds good Neuro - Alert, awake and oriented x 3, non focal exam. Skin - Warm and dry.  Data Reviewed:      Latest Ref Rng & Units 12/14/2023    5:34 AM 12/12/2023    5:32 AM 12/11/2023     4:42 AM  CBC  WBC 4.0 - 10.5 K/uL 9.1  6.8  5.8   Hemoglobin 12.0 - 15.0 g/dL 89.2  88.8  9.9   Hematocrit 36.0 - 46.0 % 36.6  38.0  33.9   Platelets 150 - 400 K/uL 169  158  128       Latest Ref Rng & Units 12/14/2023    5:34 AM 12/11/2023    4:42 AM 12/10/2023    6:22 PM  BMP  Glucose 70 - 99 mg/dL 76  76  82   BUN 8 - 23 mg/dL 25  31  35   Creatinine 0.44 - 1.00 mg/dL 7.79  8.01  7.88   Sodium 135 - 145 mmol/L 139  131  132   Potassium 3.5 - 5.1 mmol/L 3.5  4.8  5.7   Chloride 98 - 111 mmol/L 113  108  106   CO2 22 - 32 mmol/L 16  17  17    Calcium 8.9 - 10.3 mg/dL 8.5  8.5  9.0    No results found.  Family Communication: Discussed with patient, husband at bedside. They understand and agree. All questions answered.  Disposition: Status is: Inpatient Remains inpatient appropriate because: debility, rash, leg swelling, foley.  Planned Discharge Destination: Skilled nursing facility     Time spent: 45 minutes  Author: Concepcion Riser, MD 12/14/2023 7:12 PM Secure chat 7am to 7pm For on call review [email or web address].

## 2023-12-15 DIAGNOSIS — I5032 Chronic diastolic (congestive) heart failure: Secondary | ICD-10-CM | POA: Diagnosis not present

## 2023-12-15 DIAGNOSIS — E039 Hypothyroidism, unspecified: Secondary | ICD-10-CM | POA: Diagnosis not present

## 2023-12-15 DIAGNOSIS — R338 Other retention of urine: Secondary | ICD-10-CM

## 2023-12-15 DIAGNOSIS — T360X5A Adverse effect of penicillins, initial encounter: Secondary | ICD-10-CM

## 2023-12-15 DIAGNOSIS — J189 Pneumonia, unspecified organism: Secondary | ICD-10-CM | POA: Diagnosis not present

## 2023-12-15 DIAGNOSIS — L270 Generalized skin eruption due to drugs and medicaments taken internally: Secondary | ICD-10-CM

## 2023-12-15 DIAGNOSIS — N184 Chronic kidney disease, stage 4 (severe): Secondary | ICD-10-CM | POA: Diagnosis not present

## 2023-12-15 LAB — BASIC METABOLIC PANEL WITH GFR
Anion gap: 9 (ref 5–15)
BUN: 24 mg/dL — ABNORMAL HIGH (ref 8–23)
CO2: 17 mmol/L — ABNORMAL LOW (ref 22–32)
Calcium: 8.7 mg/dL — ABNORMAL LOW (ref 8.9–10.3)
Chloride: 113 mmol/L — ABNORMAL HIGH (ref 98–111)
Creatinine, Ser: 2.24 mg/dL — ABNORMAL HIGH (ref 0.44–1.00)
GFR, Estimated: 21 mL/min — ABNORMAL LOW (ref >60)
Glucose, Bld: 73 mg/dL (ref 70–99)
Potassium: 3.3 mmol/L — ABNORMAL LOW (ref 3.5–5.1)
Sodium: 139 mmol/L (ref 135–145)

## 2023-12-15 LAB — CULTURE, BLOOD (ROUTINE X 2)
Culture: NO GROWTH
Culture: NO GROWTH
Special Requests: ADEQUATE
Special Requests: ADEQUATE

## 2023-12-15 LAB — CBC
HCT: 36.6 % (ref 36.0–46.0)
Hemoglobin: 10.6 g/dL — ABNORMAL LOW (ref 12.0–15.0)
MCH: 26.8 pg (ref 26.0–34.0)
MCHC: 29 g/dL — ABNORMAL LOW (ref 30.0–36.0)
MCV: 92.7 fL (ref 80.0–100.0)
Platelets: 171 K/uL (ref 150–400)
RBC: 3.95 MIL/uL (ref 3.87–5.11)
RDW: 15.5 % (ref 11.5–15.5)
WBC: 9.4 K/uL (ref 4.0–10.5)
nRBC: 0 % (ref 0.0–0.2)

## 2023-12-15 MED ORDER — POLYVINYL ALCOHOL 1.4 % OP SOLN: 2.0000 [drp] | OPHTHALMIC | Status: DC | PRN

## 2023-12-15 MED ORDER — KETOTIFEN FUMARATE 0.035 % OP SOLN
1.0000 [drp] | Freq: Two times a day (BID) | OPHTHALMIC | Status: DC
  Administered 2023-12-15 – 2023-12-17 (×5): 1 [drp] via OPHTHALMIC
  Filled 2023-12-15: qty 5

## 2023-12-15 MED ORDER — LORATADINE 10 MG PO TABS
10.0000 mg | ORAL_TABLET | Freq: Every day | ORAL | Status: AC
  Administered 2023-12-15 – 2023-12-17 (×3): 10 mg via ORAL
  Filled 2023-12-15 (×3): qty 1

## 2023-12-15 MED ORDER — POTASSIUM CHLORIDE CRYS ER 20 MEQ PO TBCR
40.0000 meq | EXTENDED_RELEASE_TABLET | Freq: Once | ORAL | Status: AC
  Administered 2023-12-15: 40 meq via ORAL
  Filled 2023-12-15: qty 2

## 2023-12-15 NOTE — Assessment & Plan Note (Signed)
No clinical evidence of cardiogenic volume overload Strict input and output monitoring Daily weights Low-sodium diet  

## 2023-12-15 NOTE — Assessment & Plan Note (Signed)
 Given Lokelma  Potassium supplements held Resolved

## 2023-12-15 NOTE — Assessment & Plan Note (Signed)
 Resulting in being chronically bedbound Fall precautions

## 2023-12-15 NOTE — Assessment & Plan Note (Signed)
 Thought to have been secondary to pneumonia, now resolved

## 2023-12-15 NOTE — Assessment & Plan Note (Signed)
 Discontinuing Foley catheter, attempting voiding trial

## 2023-12-15 NOTE — Assessment & Plan Note (Signed)
 Rash began to become more diffuse and worsened throughout the hospitalization prompting eventual attribution to Unasyn  and transition of current antibiotic regimen to doxycycline  monotherapy Associated pruritus improving Patient is also complaining some itching of the eyes which is being managed with ketotifen  eyedrops Conservative management

## 2023-12-15 NOTE — Assessment & Plan Note (Signed)
 Continue Synthroid 

## 2023-12-15 NOTE — Assessment & Plan Note (Signed)
 Initially treated for suspected aspiration pneumonia suspect aspiration pneumonia.  Currently saturating well on room air.   Initial antibiotic treatment broad-spectrum.  Patient has since developed diffuse rash thought to possibly be secondary to Unasyn , Patient has been switched to doxycycline  monotherapy. Now on room air

## 2023-12-15 NOTE — Progress Notes (Signed)
 PROGRESS NOTE   Madison [name]  FMW:[id number] DOB: 1940-10-13 DOA: 12/10/2023 PCP: Mast, Man X, NP   Date of Service: the patient was seen and examined on 12/15/2023  Brief Narrative:  83 y.o. 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   Upon initial evaluation in the emergency department CT imaging of the chest abdomen and pelvis revealed concerns for multifocal pneumonia.  Patient was initiated on intravenous antibiotic therapy and the hospitalist group was then called to assess patient for admission in the hospital.  That is the following patient's encephalopathy improved as did the patient's pneumonia however hospitalization has been complicated by development of a diffuse erythematous rash.    Assessment & Plan Multifocal pneumonia Aspiration pneumonia (HCC) Initially treated for suspected aspiration pneumonia suspect aspiration pneumonia.  Currently saturating well on room air.   Initial antibiotic treatment broad-spectrum.  Patient has since developed diffuse rash thought to possibly be secondary to Unasyn , Patient has been switched to doxycycline  monotherapy. Now on room air Penicillin-induced allergic rash Rash began to become more diffuse and worsened throughout the hospitalization prompting eventual attribution to Unasyn  and transition of current antibiotic regimen to doxycycline  monotherapy Associated pruritus improving Patient is also complaining some itching of the eyes which is being managed with ketotifen  eyedrops Conservative management Acute urinary retention Discontinuing Foley catheter, attempting voiding trial Hypothermia Thought to have been secondary to pneumonia, now resolved Chronic heart  failure with preserved ejection fraction (HFpEF) (HCC) No clinical evidence of cardiogenic volume overload Strict input and output monitoring Daily weights Low-sodium diet  CKD (chronic kidney disease) stage 4, GFR 15-29 ml/min (HCC) Strict intake and output monitoring Creatinine near baseline Minimizing nephrotoxic agents as much as possible Serial chemistries to monitor renal function and electrolytes  Hypothyroidism Continue Synthroid  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  Patient denies discharge, blistering of the skin, mucosal involvement.  Physical Exam:  Vitals:   12/14/23 1940 12/15/23 0454 12/15/23 0520 12/15/23 1326  BP: 137/65  119/71 (!) 143/60  Pulse: 63  65   Resp: 19  18   Temp: 98.7 F (37.1 C)  98.2 F (36.8 C) 98.2 F (36.8 C)  TempSrc:    Oral  SpO2: 100%  98% 98%  Weight:  (!) 139.6 kg    Height:         Constitutional: Awake alert and oriented x3, no associated distress.  Patient is obese. Skin: Diffuse erythematous rash. Eyes: Very slight injection bilaterally.  No evidence of scleral icterus or conjunctival pallor.  ENMT: Moist mucous membranes noted.  Posterior pharynx clear of any exudate or lesions.   Respiratory: clear to auscultation bilaterally, no wheezing, no crackles. Normal respiratory effort. No accessory muscle use.  Cardiovascular: Regular rate and rhythm, no murmurs / rubs / gallops. No extremity edema. 2+ pedal pulses. No carotid bruits.  Abdomen: Abdomen is soft and nontender.  No evidence of intra-abdominal masses.  Positive bowel sounds noted in all quadrants.   Musculoskeletal: Limited range of motion of the right lower extremity due to pain of the right hip  on both passive and  active range of motion.  Data Reviewed:  I have personally reviewed and interpreted labs, imaging.  Significant findings are   CBC: Recent Labs  Lab 12/10/23 1822 12/11/23 0442 12/12/23 0532 12/14/23 0534 12/15/23 0507  WBC 5.4 5.8 6.8 9.1 9.4  NEUTROABS 3.9  --   --   --   --   HGB 10.6* 9.9* 11.1* 10.7* 10.6*  HCT 35.7* 33.9* 38.0 36.6 36.6  MCV 92.5 92.1 93.4 94.1 92.7  PLT 142* 128* 158 169 171   Basic Metabolic Panel: Recent Labs  Lab 12/10/23 1822 12/11/23 0442 12/14/23 0534 12/15/23 0507  NA 132* 131* 139 139  K 5.7* 4.8 3.5 3.3*  CL 106 108 113* 113*  CO2 17* 17* 16* 17*  GLUCOSE 82 76 76 73  BUN 35* 31* 25* 24*  CREATININE 2.11* 1.98* 2.20* 2.24*  CALCIUM 9.0 8.5* 8.5* 8.7*   GFR: Estimated Creatinine Clearance: 28.3 mL/min (A) (by C-G formula based on SCr of 2.24 mg/dL (H)). Liver Function Tests: Recent Labs  Lab 12/10/23 1822  AST 14*  ALT 15  ALKPHOS 111  BILITOT 0.5  PROT 7.1  ALBUMIN 3.5    Coagulation Profile: Recent Labs  Lab 12/10/23 1822  INR 1.0      Code Status:  Full code.  Code status decision has been confirmed with: patient    Severity of Illness:  The appropriate patient status for this patient is INPATIENT. Inpatient status is judged to be reasonable and necessary in order to provide the required intensity of service to ensure the patient's safety. The patient's presenting symptoms, physical exam findings, and initial radiographic and laboratory data in the context of their chronic comorbidities is felt to place them at high risk for further clinical deterioration. Furthermore, it is not anticipated that the patient will be medically stable for discharge from the hospital within 2 midnights of admission.   * I certify that at the point of admission it is my clinical judgment that the patient will require inpatient hospital care spanning beyond 2 midnights from the point of admission due to high intensity of service, high  risk for further deterioration and high frequency of surveillance required.*  Time spent:  51 minutes  Author:  Zachary JINNY Ba MD  12/15/2023 8:04 PM

## 2023-12-15 NOTE — Assessment & Plan Note (Signed)
 Strict intake and output monitoring Creatinine near baseline Minimizing nephrotoxic agents as much as possible Serial chemistries to monitor renal function and electrolytes

## 2023-12-16 ENCOUNTER — Inpatient Hospital Stay (HOSPITAL_COMMUNITY)

## 2023-12-16 DIAGNOSIS — H1589 Other disorders of sclera: Secondary | ICD-10-CM | POA: Insufficient documentation

## 2023-12-16 DIAGNOSIS — J189 Pneumonia, unspecified organism: Secondary | ICD-10-CM | POA: Diagnosis not present

## 2023-12-16 DIAGNOSIS — I5032 Chronic diastolic (congestive) heart failure: Secondary | ICD-10-CM | POA: Diagnosis not present

## 2023-12-16 DIAGNOSIS — E039 Hypothyroidism, unspecified: Secondary | ICD-10-CM | POA: Diagnosis not present

## 2023-12-16 DIAGNOSIS — N184 Chronic kidney disease, stage 4 (severe): Secondary | ICD-10-CM | POA: Diagnosis not present

## 2023-12-16 LAB — COMPREHENSIVE METABOLIC PANEL WITH GFR
ALT: 11 U/L (ref 0–44)
AST: 11 U/L — ABNORMAL LOW (ref 15–41)
Albumin: 3 g/dL — ABNORMAL LOW (ref 3.5–5.0)
Alkaline Phosphatase: 82 U/L (ref 38–126)
Anion gap: 10 (ref 5–15)
BUN: 25 mg/dL — ABNORMAL HIGH (ref 8–23)
CO2: 15 mmol/L — ABNORMAL LOW (ref 22–32)
Calcium: 8.5 mg/dL — ABNORMAL LOW (ref 8.9–10.3)
Chloride: 112 mmol/L — ABNORMAL HIGH (ref 98–111)
Creatinine, Ser: 2.45 mg/dL — ABNORMAL HIGH (ref 0.44–1.00)
GFR, Estimated: 19 mL/min — ABNORMAL LOW (ref >60)
Glucose, Bld: 70 mg/dL (ref 70–99)
Potassium: 3.6 mmol/L (ref 3.5–5.1)
Sodium: 137 mmol/L (ref 135–145)
Total Bilirubin: 0.5 mg/dL (ref 0.0–1.2)
Total Protein: 6.2 g/dL — ABNORMAL LOW (ref 6.5–8.1)

## 2023-12-16 LAB — URINALYSIS, ROUTINE W REFLEX MICROSCOPIC
Bilirubin Urine: NEGATIVE
Glucose, UA: NEGATIVE mg/dL
Ketones, ur: NEGATIVE mg/dL
Nitrite: POSITIVE — AB
Protein, ur: 100 mg/dL — AB
Specific Gravity, Urine: 1.009 (ref 1.005–1.030)
pH: 6 (ref 5.0–8.0)

## 2023-12-16 LAB — CBC WITH DIFFERENTIAL/PLATELET
Abs Immature Granulocytes: 0.1 K/uL — ABNORMAL HIGH (ref 0.00–0.07)
Basophils Absolute: 0 K/uL (ref 0.0–0.1)
Basophils Relative: 0 %
Eosinophils Absolute: 1 K/uL — ABNORMAL HIGH (ref 0.0–0.5)
Eosinophils Relative: 11 %
HCT: 37.6 % (ref 36.0–46.0)
Hemoglobin: 10.9 g/dL — ABNORMAL LOW (ref 12.0–15.0)
Immature Granulocytes: 1 %
Lymphocytes Relative: 16 %
Lymphs Abs: 1.3 K/uL (ref 0.7–4.0)
MCH: 27.3 pg (ref 26.0–34.0)
MCHC: 29 g/dL — ABNORMAL LOW (ref 30.0–36.0)
MCV: 94 fL (ref 80.0–100.0)
Monocytes Absolute: 0.6 K/uL (ref 0.1–1.0)
Monocytes Relative: 7 %
Neutro Abs: 5.3 K/uL (ref 1.7–7.7)
Neutrophils Relative %: 65 %
Platelets: 183 K/uL (ref 150–400)
RBC: 4 MIL/uL (ref 3.87–5.11)
RDW: 15.8 % — ABNORMAL HIGH (ref 11.5–15.5)
WBC: 8.3 K/uL (ref 4.0–10.5)
nRBC: 0.2 % (ref 0.0–0.2)

## 2023-12-16 LAB — GLUCOSE, CAPILLARY: Glucose-Capillary: 109 mg/dL — ABNORMAL HIGH (ref 70–99)

## 2023-12-16 LAB — PROCALCITONIN: Procalcitonin: <0.1 ng/mL

## 2023-12-16 LAB — TSH: TSH: 5.752 u[IU]/mL — ABNORMAL HIGH (ref 0.350–4.500)

## 2023-12-16 LAB — MAGNESIUM: Magnesium: 2.1 mg/dL (ref 1.7–2.4)

## 2023-12-16 LAB — LACTIC ACID, PLASMA: Lactic Acid, Venous: 0.7 mmol/L (ref 0.5–1.9)

## 2023-12-16 MED ORDER — POLYMYXIN B-TRIMETHOPRIM 10000-0.1 UNIT/ML-% OP SOLN
2.0000 [drp] | Freq: Four times a day (QID) | OPHTHALMIC | Status: DC
  Administered 2023-12-16 – 2023-12-17 (×4): 2 [drp] via OPHTHALMIC
  Filled 2023-12-16: qty 10

## 2023-12-16 MED ORDER — LACTATED RINGERS IV SOLN: INTRAVENOUS | Status: AC

## 2023-12-16 MED ORDER — GERHARDT'S BUTT CREAM
TOPICAL_CREAM | Freq: Two times a day (BID) | CUTANEOUS | Status: DC
  Filled 2023-12-16: qty 60

## 2023-12-16 NOTE — Assessment & Plan Note (Signed)
 Continue Synthroid 

## 2023-12-16 NOTE — Assessment & Plan Note (Signed)
 Discontinuing Foley catheter, attempting voiding trial

## 2023-12-16 NOTE — Assessment & Plan Note (Signed)
 Felt to have been secondary to allergic drug reaction However today patient is exhibiting some thick white discharge from the left eye Will add to the Ketotifen  anti-inflammatory eyedrops a 7-day course of Polytrim  eyedrops

## 2023-12-16 NOTE — Assessment & Plan Note (Signed)
 Resulting in being chronically bedbound Fall precautions

## 2023-12-16 NOTE — Assessment & Plan Note (Signed)
 Given Lokelma  Potassium supplements held Resolved

## 2023-12-16 NOTE — Assessment & Plan Note (Signed)
 Madison [name] earlier in the hospitalization and thought to have been secondary to pneumonia. Recurrent episode today but no obvious clinical evidence of recurrent infection Obtained TSH, procalcitonin, lactic acid, urinalysis and chest x-ray

## 2023-12-16 NOTE — Progress Notes (Signed)
 PROGRESS NOTE   Madison [name]  FMW:[id number] DOB: [birth year] DOA: 12/10/2023 PCP: Mast, Man X, NP   Date of Service: the patient was seen and examined on 12/16/2023  Brief Narrative:  83 y.o. 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   Upon initial evaluation in the emergency department CT imaging of the chest abdomen and pelvis revealed concerns for multifocal pneumonia.  Patient was initiated on intravenous antibiotic therapy and the hospitalist group was then called to assess patient for admission in the hospital.  That is the following patient's encephalopathy improved as did the patient's pneumonia however hospitalization has been complicated by development of a diffuse erythematous rash.    Assessment & Plan Multifocal pneumonia Aspiration pneumonia (HCC) Initially treated for suspected aspiration pneumonia Currently saturating well on room air.   Initial broad-spectrum antibiotic treatment.  Patient has since developed diffuse rash thought to possibly be secondary to Unasyn , Patient has been switched to doxycycline  monotherapy. Now on room air Hypothermia [place] earlier in the hospitalization and thought to have been secondary to pneumonia. Recurrent episode today but no obvious clinical evidence of recurrent infection Obtained TSH, procalcitonin, lactic acid, urinalysis and chest x-ray Penicillin-induced allergic rash Rash developed several days after hospitalization and began to become more diffuse and worsened prompting eventual attribution to Unasyn  and transition of current antibiotic regimen to doxycycline  monotherapy Slow improvement in generalized redness.  Associated pruritus resolved Patient is also  complaining some itching of the eyes which is being managed with ketotifen  eyedrops Conservative management Injection of surface of eye, bilateral Felt to have been secondary to allergic drug reaction However today patient is exhibiting some thick white discharge from the left eye Will add to the Ketotifen  anti-inflammatory eyedrops a 7-day course of Polytrim  eyedrops Acute urinary retention Discontinuing Foley catheter, attempting voiding trial Chronic heart failure with preserved ejection fraction (HFpEF) (HCC) Will hold Lasix  starting today as her creatinine is rising  Patient has been receiving 40 mg daily of Lasix  while hospitalized however husband tells me that she typically takes 20 mg.   No clinical evidence of cardiogenic volume overload Strict input and output monitoring Daily weights Low-sodium diet  Acute renal failure superimposed on stage 4 chronic kidney disease (HCC) Rising creatinine, now 2.45, up from baseline of approximately 1.9  Possibly secondary to  increased Lasix  dosing during this hospitalization in the setting of active infection  Holding Lasix  at this time  strict intake and output monitoring Creatinine near baseline Minimizing nephrotoxic agents as much as possible Serial chemistries to monitor renal function and electrolytes  Hypothyroidism Continue Synthroid  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  The associated rash is continuing to improve.  Patient denies shortness of breath or cough.  Physical Exam:  Vitals:   12/16/23 1725 12/16/23 1727 12/16/23 1836 12/16/23 2014  BP:    (!)  114/45  Pulse:    63  Resp:    18  Temp: (!) 96 F (  35.6 C) (!) 97.1 F (36.2 C) (!) 97.1 F (36.2 C) (!) 97.5 F (36.4 C)  TempSrc: Axillary Oral Oral Oral  SpO2:    100%  Weight:      Height:         Constitutional: Awake alert and oriented x3, no associated distress.  Patient is obese. Skin: Diffuse erythematous rash. Eyes: Worsening injection of the bilateral conjunctivo with some purulent discharge of the left eye.  ENMT: Moist mucous membranes noted.  Posterior pharynx clear of any exudate or lesions.   Respiratory: clear to auscultation bilaterally, no wheezing, no crackles. Normal respiratory effort. No accessory muscle use.  Cardiovascular: Regular rate and rhythm, no murmurs / rubs / gallops. No extremity edema. 2+ pedal pulses. No carotid bruits.  Abdomen: Abdomen is protuberant but soft and nontender.  No evidence of intra-abdominal masses.  Positive bowel sounds noted in all quadrants.   Musculoskeletal: Limited range of motion of the right lower extremity due to pain of the right hip on both passive and active range of motion.  Data Reviewed:  I have personally reviewed and interpreted labs, imaging.  Significant findings are   CBC: Recent Labs  Lab 12/10/23 1822 12/11/23 0442 12/12/23 0532 12/14/23 0534 12/15/23 0507 12/16/23 0650  WBC 5.4 5.8 6.8 9.1 9.4 8.3  NEUTROABS 3.9  --   --   --   --  5.3  HGB 10.6* 9.9* 11.1* 10.7* 10.6* 10.9*  HCT 35.7* 33.9* 38.0 36.6 36.6 37.6  MCV 92.5 92.1 93.4 94.1 92.7 94.0  PLT 142* 128* 158 169 171 183   Basic Metabolic Panel: Recent Labs  Lab 12/10/23 1822 12/11/23 0442 12/14/23 0534 12/15/23 0507 12/16/23 0650  NA 132* 131* 139 139 137  K 5.7* 4.8 3.5 3.3* 3.6  CL 106 108 113* 113* 112*  CO2 17* 17* 16* 17* 15*  GLUCOSE 82 76 76 73 70  BUN 35* 31* 25* 24* 25*  CREATININE 2.11* 1.98* 2.20* 2.24* 2.45*  CALCIUM 9.0 8.5* 8.5* 8.7* 8.5*  MG  --   --   --   --  2.1   GFR: Estimated Creatinine Clearance:  25.9 mL/min (A) (by C-G formula based on SCr of 2.45 mg/dL (H)). Liver Function Tests: Recent Labs  Lab 12/10/23 1822 12/16/23 0650  AST 14* 11*  ALT 15 11  ALKPHOS 111 82  BILITOT 0.5 0.5  PROT 7.1 6.2*  ALBUMIN 3.5 3.0*    Coagulation Profile: Recent Labs  Lab 12/10/23 1822  INR 1.0      Code Status:  Full code.  Code status decision has been confirmed with: patient    Severity of Illness:  The appropriate patient status for this patient is INPATIENT. Inpatient status is judged to be reasonable and necessary in order to provide the required intensity of service to ensure the patient's safety. The patient's presenting symptoms, physical exam findings, and initial radiographic and laboratory data in the context of their chronic comorbidities is felt to place them at high risk for further clinical deterioration. Furthermore, it is not anticipated that the patient will be medically stable for discharge from the hospital within 2 midnights of admission.   * I certify that at the point of admission it is my clinical judgment that the patient will require inpatient hospital care spanning beyond 2 midnights from the point of admission due to high intensity of service, high risk for further deterioration and high frequency of surveillance required.*  Time spent:  54 minutes  Author:  Zachary JINNY Ba MD  12/16/2023 9:34 PM

## 2023-12-16 NOTE — TOC Progression Note (Addendum)
 Transition of Care [facility]) - Progression Note    Patient Details  Name: Madison [name] MRN: [id number] Date of Birth: 1941-03-07  [facility]) CM/SW Contact  Sonda Manuella Quill, RN Phone Number: 12/16/2023, 10:06 AM  Clinical Narrative:    Received call from Everlina Nam, on call for [facility]; she inquired about pt d/c to facility today; secure chat sent to Dr Kenard; awaiting response; Tijuan will call back for update.  -1215- spoke w/ Tijuan at facility; she gave [facility] on [facility], [place] # given on arrival to facility; call report # [phone], ext [phone]  -1304- notified by Dr Kenard not ready for d/c; Tijuan at facility notified  Expected Discharge Plan: Long Term Nursing Home Barriers to Discharge: Continued Medical Work up               Expected Discharge Plan and Services     Post Acute Care Choice: Nursing Home                                         Social Drivers of Health (SDOH) Interventions SDOH Screenings   Food Insecurity: No Food Insecurity (12/11/2023)  Housing: Low Risk  (12/11/2023)  Transportation Needs: No Transportation Needs (12/11/2023)  Utilities: Not At Risk (12/11/2023)  Depression (PHQ2-9): Low Risk  (02/21/2023)  Financial Resource Strain: Low Risk  (01/16/2021)   Received from [facility]  Social Connections: Socially Isolated (12/11/2023)  Stress: No Stress Concern Present (01/13/2021)   Received from [facility]  Tobacco Use: Medium Risk (12/10/2023)    Readmission Risk Interventions    10/17/2022   12:09 PM  Readmission Risk Prevention Plan  Transportation Screening Complete  Home Care Screening Complete  Medication Review (RN CM) Complete

## 2023-12-16 NOTE — Assessment & Plan Note (Signed)
 Initially treated for suspected aspiration pneumonia Currently saturating well on room air.   Initial broad-spectrum antibiotic treatment.  Patient has since developed diffuse rash thought to possibly be secondary to Unasyn , Patient has been switched to doxycycline  monotherapy. Now on room air

## 2023-12-16 NOTE — Assessment & Plan Note (Signed)
 Rash developed several days after hospitalization and began to become more diffuse and worsened prompting eventual attribution to Unasyn  and transition of current antibiotic regimen to doxycycline  monotherapy Slow improvement in generalized redness.  Associated pruritus resolved Patient is also complaining some itching of the eyes which is being managed with ketotifen  eyedrops Conservative management

## 2023-12-16 NOTE — Assessment & Plan Note (Signed)
 Rising creatinine, now 2.45, up from baseline of approximately 1.9  Possibly secondary to  increased Lasix  dosing during this hospitalization in the setting of active infection  Holding Lasix  at this time  strict intake and output monitoring Creatinine near baseline Minimizing nephrotoxic agents as much as possible Serial chemistries to monitor renal function and electrolytes

## 2023-12-16 NOTE — Assessment & Plan Note (Signed)
 Will hold Lasix  starting today as her creatinine is rising  Patient has been receiving 40 mg daily of Lasix  while hospitalized however husband tells me that she typically takes 20 mg.   No clinical evidence of cardiogenic volume overload Strict input and output monitoring Daily weights Low-sodium diet

## 2023-12-17 DIAGNOSIS — I5032 Chronic diastolic (congestive) heart failure: Secondary | ICD-10-CM | POA: Diagnosis not present

## 2023-12-17 DIAGNOSIS — J189 Pneumonia, unspecified organism: Secondary | ICD-10-CM | POA: Diagnosis not present

## 2023-12-17 DIAGNOSIS — N179 Acute kidney failure, unspecified: Secondary | ICD-10-CM | POA: Diagnosis not present

## 2023-12-17 DIAGNOSIS — E039 Hypothyroidism, unspecified: Secondary | ICD-10-CM | POA: Diagnosis not present

## 2023-12-17 LAB — FOLATE: Folate: 6.8 ng/mL (ref >5.9)

## 2023-12-17 LAB — CBC WITH DIFFERENTIAL/PLATELET
Abs Immature Granulocytes: 0.11 K/uL — ABNORMAL HIGH (ref 0.00–0.07)
Basophils Absolute: 0.1 K/uL (ref 0.0–0.1)
Basophils Relative: 1 %
Eosinophils Absolute: 0.7 K/uL — ABNORMAL HIGH (ref 0.0–0.5)
Eosinophils Relative: 7 %
HCT: 32.8 % — ABNORMAL LOW (ref 36.0–46.0)
Hemoglobin: 9.9 g/dL — ABNORMAL LOW (ref 12.0–15.0)
Immature Granulocytes: 1 %
Lymphocytes Relative: 18 %
Lymphs Abs: 1.7 K/uL (ref 0.7–4.0)
MCH: 27.9 pg (ref 26.0–34.0)
MCHC: 30.2 g/dL (ref 30.0–36.0)
MCV: 92.4 fL (ref 80.0–100.0)
Monocytes Absolute: 0.6 K/uL (ref 0.1–1.0)
Monocytes Relative: 6 %
Neutro Abs: 6.3 K/uL (ref 1.7–7.7)
Neutrophils Relative %: 67 %
Platelets: 198 K/uL (ref 150–400)
RBC: 3.55 MIL/uL — ABNORMAL LOW (ref 3.87–5.11)
RDW: 15.8 % — ABNORMAL HIGH (ref 11.5–15.5)
WBC: 9.4 K/uL (ref 4.0–10.5)
nRBC: 0 % (ref 0.0–0.2)

## 2023-12-17 LAB — FERRITIN: Ferritin: 155 ng/mL (ref 11–307)

## 2023-12-17 LAB — COMPREHENSIVE METABOLIC PANEL WITH GFR
ALT: 10 U/L (ref 0–44)
AST: 10 U/L — ABNORMAL LOW (ref 15–41)
Albumin: 2.7 g/dL — ABNORMAL LOW (ref 3.5–5.0)
Alkaline Phosphatase: 76 U/L (ref 38–126)
Anion gap: 11 (ref 5–15)
BUN: 24 mg/dL — ABNORMAL HIGH (ref 8–23)
CO2: 18 mmol/L — ABNORMAL LOW (ref 22–32)
Calcium: 8.5 mg/dL — ABNORMAL LOW (ref 8.9–10.3)
Chloride: 110 mmol/L (ref 98–111)
Creatinine, Ser: 2.34 mg/dL — ABNORMAL HIGH (ref 0.44–1.00)
GFR, Estimated: 20 mL/min — ABNORMAL LOW (ref >60)
Glucose, Bld: 63 mg/dL — ABNORMAL LOW (ref 70–99)
Potassium: 3.6 mmol/L (ref 3.5–5.1)
Sodium: 139 mmol/L (ref 135–145)
Total Bilirubin: 0.4 mg/dL (ref 0.0–1.2)
Total Protein: 5.8 g/dL — ABNORMAL LOW (ref 6.5–8.1)

## 2023-12-17 LAB — IRON AND TIBC
Iron: 61 ug/dL (ref 28–170)
Saturation Ratios: 25 % (ref 10.4–31.8)
TIBC: 249 ug/dL — ABNORMAL LOW (ref 250–450)
UIBC: 188 ug/dL

## 2023-12-17 LAB — MAGNESIUM: Magnesium: 2 mg/dL (ref 1.7–2.4)

## 2023-12-17 LAB — CORTISOL: Cortisol, Plasma: 11.1 ug/dL

## 2023-12-17 LAB — VITAMIN B12: Vitamin B-12: 216 pg/mL (ref 180–914)

## 2023-12-17 MED ORDER — POLYMYXIN B-TRIMETHOPRIM 10000-0.1 UNIT/ML-% OP SOLN: 2.0000 [drp] | Freq: Four times a day (QID) | OPHTHALMIC | Status: AC

## 2023-12-17 MED ORDER — KETOTIFEN FUMARATE 0.035 % OP SOLN: 1.0000 [drp] | Freq: Two times a day (BID) | OPHTHALMIC | Status: AC

## 2023-12-17 MED ORDER — POLYVINYL ALCOHOL 1.4 % OP SOLN: 2.0000 [drp] | OPHTHALMIC | Status: DC | PRN

## 2023-12-17 MED ORDER — GERHARDT'S BUTT CREAM: 1.0000 | TOPICAL_CREAM | Freq: Two times a day (BID) | CUTANEOUS | Status: DC

## 2023-12-17 MED ORDER — DOXYCYCLINE HYCLATE 100 MG PO TABS: 100.0000 mg | ORAL_TABLET | Freq: Two times a day (BID) | ORAL | Status: AC

## 2023-12-17 NOTE — Plan of Care (Signed)
  Problem: Education: Goal: Knowledge of General Education information will improve Description: Including pain rating scale, medication(s)/side effects and non-pharmacologic comfort measures 12/17/2023 1019 by Delores Kirsch, RN Outcome: Adequate for Discharge 12/17/2023 1017 by Delores Kirsch, RN Outcome: Progressing   Problem: Health Behavior/Discharge Planning: Goal: Ability to manage health-related needs will improve 12/17/2023 1019 by Delores Kirsch, RN Outcome: Adequate for Discharge 12/17/2023 1017 by Delores Kirsch, RN Outcome: Progressing   Problem: Clinical Measurements: Goal: Ability to maintain clinical measurements within normal limits will improve Outcome: Adequate for Discharge Goal: Will remain free from infection 12/17/2023 1019 by Delores Kirsch, RN Outcome: Adequate for Discharge 12/17/2023 1017 by Delores Kirsch, RN Outcome: Progressing Goal: Diagnostic test results will improve Outcome: Adequate for Discharge Goal: Respiratory complications will improve Outcome: Adequate for Discharge Goal: Cardiovascular complication will be avoided Outcome: Adequate for Discharge   Problem: Activity: Goal: Risk for activity intolerance will decrease Outcome: Adequate for Discharge   Problem: Nutrition: Goal: Adequate nutrition will be maintained Outcome: Adequate for Discharge   Problem: Coping: Goal: Level of anxiety will decrease Outcome: Adequate for Discharge   Problem: Elimination: Goal: Will not experience complications related to bowel motility Outcome: Adequate for Discharge Goal: Will not experience complications related to urinary retention Outcome: Adequate for Discharge   Problem: Pain Managment: Goal: General experience of comfort will improve and/or be controlled Outcome: Adequate for Discharge   Problem: Safety: Goal: Ability to remain free from injury will improve Outcome: Adequate for Discharge   Problem: Skin Integrity: Goal: Risk for  impaired skin integrity will decrease Outcome: Adequate for Discharge   Problem: SLP Dysphagia Goals Goal: Misc Dysphagia Goal Outcome: Adequate for Discharge

## 2023-12-17 NOTE — Progress Notes (Signed)
 [facility] came to transport patient

## 2023-12-17 NOTE — Discharge Instructions (Addendum)
 Patient is full code  Patient is bedbound and only able to get out of bed using a Hoyer lift or other assistive device. Patient to be ministered all prescribed medications exactly as instructed. Patient receives a low-sodium low carbohydrate diet Follow-up with facility provider per protocol.   Please bring patient back to the emergency department if they develop sudden is in mentation, shortness of breath,  fevers of greater than 100.4 F or worsening weakness.

## 2023-12-17 NOTE — Discharge Summary (Addendum)
 Physician Discharge Summary   Patient: Madison [name] MRN: [id number] DOB: 06/28/1940  Admit date:     12/10/2023  Discharge date: 12/17/23  Discharge Physician: Zachary JINNY Ba   PCP: Mast, Man X, NP   Recommendations at discharge:   Patient is full code  Patient is bedbound and only able to get out of bed using a Hoyer lift or other assistive device. Patient to be ministered all prescribed medications exactly as instructed including her remaining course of oral antibiotics and antibiotic eye drops. Patient receives a low-sodium low carbohydrate diet Follow-up with facility provider per protocol.   Please bring patient back to the emergency department if they develop sudden is in mentation, shortness of breath,  fevers of greater than 100.4 F or worsening weakness.    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 *   Hospital Course: 83 y.o. 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   Upon initial evaluation in the emergency department CT imaging of the chest abdomen and pelvis  revealed concerns for multifocal pneumonia.  Patient was initiated on intravenous antibiotic therapy and the hospitalist group was then called to assess patient for admission in the hospital.  Throughout the hospital course the patient's encephalopathy resolved with treatment of underlying infection and the patient's pneumonia additionally showed signs of improvement.  Unfortunately hospitalization was complicated by development of a diffuse erythematous rash thought to be secondary to a drug reaction although the exact culprit medication was unclear.  As a precaution, patient's antibiotic regimen was transitioned to doxycycline  to complete her course of therapy.  Pruritic skin lesions were managed with as needed Benadryl .  Patient additionally developed extremely pruritic eyes and because of concurrent discharge patient was also placed on a 7-day course of Polytrim  eyedrops.  Hospitalization was additionally complicated by intermittent hypothermia.  This was extensively evaluated.  No secondary source of infection could be identified.  Patient's TSH was near normal and was not thought to be the culprit either.  Hypothermia spontaneously resolved.  With patient's clinical improvement patient was discharged back [facility] with the remaining course of her oral doxycycline  therapy and Polytrim  eyedrops.      Pain control - [place]  Controlled Substance Reporting System database was reviewed. and patient was instructed, not to drive, operate heavy machinery, perform activities at heights, swimming or participation in water activities or provide baby-sitting services while on Pain, Sleep and Anxiety Medications; until their outpatient Physician has advised to do so again. Also recommended to not to take more than prescribed Pain, Sleep and Anxiety Medications.   Consultants: None Procedures performed: none  Disposition: Skilled nursing facility Diet  recommendation:  Discharge Diet Orders (From admission, onward)     Start     Ordered   12/17/23 0000  Diet - low sodium heart healthy        12/17/23 0941           Cardiac diet  DISCHARGE MEDICATION: Allergies as of 12/17/2023       Reactions   Ciprofloxacin  Other (See Comments)   Unknown    Keflex [cephalexin] Diarrhea   Macrobid [nitrofurantoin] Hives   Amoxicillin Rash   Has since tolerated Zosyn  inpatient   Cefadroxil  Rash   Allergy not listed on MAR         Medication List     TAKE these medications    acetaminophen  325 MG tablet Commonly known as: TYLENOL  Take 650 mg by mouth every 4 (four) hours as needed for fever, moderate pain (pain score 4-6) or mild pain (pain score 1-3).   albuterol  108 (90 Base) MCG/ACT inhaler Commonly known as: VENTOLIN  HFA Inhale 2 puffs into the lungs every 6 (six) hours as needed for wheezing or shortness of breath.   artificial tears ophthalmic solution Place 2 drops into both eyes as needed for dry eyes.   bifidobacterium infantis capsule Take 1 capsule by mouth daily.   CALCIUM PLUS VITAMIN D3 PO Take 1 tablet by mouth daily. Vitamin D3 20mcg + Calcium 600   Cranberry 500 MG Tabs Take 500 mg by mouth in the morning and at bedtime.   docusate sodium  100 MG capsule Commonly known as: COLACE Take 100 mg by mouth daily as needed for mild constipation or moderate constipation.   doxycycline  100 MG tablet Commonly known as: VIBRA -TABS Take 1 tablet (100 mg total) by mouth every 12 (twelve) hours for 5 days.   ferrous sulfate  325 (65 FE) MG EC tablet Take 325 mg by mouth daily.   furosemide  20 MG tablet Commonly known as: LASIX  Take 20 mg by mouth daily.   Gerhardt's butt cream Crea Apply 1 Application topically 2 (two) times daily. Apply to excoriated areas of groin/perineum   guaiFENesin -dextromethorphan 100-10 MG/5ML syrup Commonly known as: ROBITUSSIN DM Take 10 mLs by mouth every 4 (four) hours as needed  for cough.   hydrocortisone  cream 1 % Apply 1 Application topically See admin instructions. Apply to urogenital area topically at bedtime every other day for itchy areas related to vaginitis.   ipratropium-albuterol  0.5-2.5 (3) MG/3ML Soln Commonly known as: DUONEB Inhale 3 mLs into the lungs every 6 (six) hours as needed (wheezing). And every 8 hours as needed per [facility]   ketoconazole 2 % cream Commonly known as: NIZORAL Apply 1 Application topically 2 (two) times daily. Apply to peri area   ketotifen  0.035 % ophthalmic solution Commonly known as: ZADITOR  Place 1 drop into both eyes 2 (two) times daily.   levothyroxine  100 MCG tablet Commonly known as: SYNTHROID  Take 100 mcg by mouth daily before breakfast.   loperamide 2 MG capsule Commonly known as: IMODIUM Take 2 mg by mouth every 4 (four) hours as needed for diarrhea or loose stools.   loratadine  10 MG tablet Commonly known as: CLARITIN  Take 10 mg by mouth daily.   nystatin powder Commonly known as: MYCOSTATIN/NYSTOP Apply 1 application  topically in the morning, at noon, in the evening, and at bedtime. To genital areas and inner thigh   ondansetron  4 MG tablet Commonly known as: ZOFRAN  Take 4 mg by mouth every 8 (eight)  hours as needed for nausea or vomiting.   potassium chloride  10 MEQ tablet Commonly known as: KLOR-CON  M Take 30 mEq by mouth daily.   Protonix  40 MG tablet Generic drug: pantoprazole  Take 40 mg by mouth daily.   traMADol 50 MG tablet Commonly known as: ULTRAM Take 50 mg by mouth every 6 (six) hours as needed for moderate pain (pain score 4-6) or severe pain (pain score 7-10).   triamcinolone cream 0.5 % Commonly known as: KENALOG Apply 1 Application topically at bedtime. Apply to lower back skin folds rash   triamcinolone cream 0.1 % Commonly known as: KENALOG Apply 1 Application topically 2 (two) times daily. Apply to genital area   trimethoprim -polymyxin b  ophthalmic  solution Commonly known as: POLYTRIM  Place 2 drops into both eyes every 6 (six) hours for 6 days.   Vitamin D3 50 MCG (2000 UT) Tabs Take 4,000 Units by mouth daily.        Contact information for after-discharge care     Destination     [facility] .   Service: Skilled Nursing Contact information: [address] [place]  [address] [phone]                     Discharge Exam: Fredricka Weights   12/14/23 0500 12/15/23 0454 12/17/23 0427  Weight: (!) 139.8 kg (!) 139.6 kg (!) 138.4 kg   Constitutional: Awake alert and oriented x3, no associated distress.   Respiratory: clear to auscultation bilaterally, no wheezing, no crackles. Normal respiratory effort. No accessory muscle use.  Cardiovascular: Regular rate and rhythm, no murmurs / rubs / gallops. Significant pitting edema of the lower extremities up to the thighs.   2+ pedal pulses. No carotid bruits.  Abdomen: Abdomen is protuberant but soft and nontender.  No evidence of intra-abdominal masses.  Positive bowel sounds noted in all quadrants.   Musculoskeletal: Limited ROM with pain with passive and active ROM of the right hip.  Good ROM, no contractures. Poor muscle tone.     Condition at discharge: fair  The results of significant diagnostics from this hospitalization (including imaging, microbiology, ancillary and laboratory) are listed below for reference.   Imaging Studies: DG Chest 1 View Result Date: 12/16/2023 CLINICAL DATA:  Pneumonia. EXAM: CHEST  1 VIEW COMPARISON:  12/10/2023. FINDINGS: The heart is enlarged the mediastinal contour is stable. There is atherosclerotic calcification of the aorta. The pulmonary vasculature is mildly distended. No consolidation, effusion, or pneumothorax is seen. No acute osseous abnormality. IMPRESSION: 1. No focal infiltrates are identified. 2. Cardiomegaly with distended pulmonary vasculature. Electronically Signed   By: Leita Birmingham M.D.    On: 12/16/2023 14:13   CT CHEST ABDOMEN PELVIS WO CONTRAST Result Date: 12/10/2023 CLINICAL DATA:  Sepsis possible UTI EXAM: CT CHEST, ABDOMEN AND PELVIS WITHOUT CONTRAST TECHNIQUE: Multidetector CT imaging of the chest, abdomen and pelvis was performed following the standard protocol without IV contrast. RADIATION DOSE REDUCTION: This exam was performed according to the departmental dose-optimization program which includes automated exposure control, adjustment of the mA and/or kV according to patient size and/or use of iterative reconstruction technique. COMPARISON:  Chest x-ray 12/10/2023, CT 01/10/2023, chest CT 07/02/2022, hip CT 07/11/2022, 06/08/2021 FINDINGS: CT CHEST FINDINGS Cardiovascular: Limited without intravenous contrast. Mild aortic atherosclerosis. No aneurysm. Multi vessel coronary vascular calcification. Cardiomegaly. No pericardial effusion. Mediastinum/Nodes: Patent trachea no thyroid  mass. No suspicious lymph nodes. Moderate large hiatal hernia. Lungs/Pleura: Mild diffuse bilateral mosaic density likely due to airways  disease. There is diffuse bilateral bronchial wall thickening with narrowed appearance of airways. Patchy mucous plugging most evident in the lower lobes. Scattered small foci of irregular consolidation and nodularity within the bilateral lower lobes. Bandlike scarring or atelectasis in both lower lobes. No sizable pleural effusion Musculoskeletal: Sternum appears intact. Multilevel degenerative changes. No acute osseous abnormality. CT ABDOMEN PELVIS FINDINGS Hepatobiliary: No focal liver abnormality is seen. Status post cholecystectomy. No biliary dilatation. Pancreas: Unremarkable. No pancreatic ductal dilatation or surrounding inflammatory changes. Spleen: Normal in size without focal abnormality. Adrenals/Urinary Tract: Adrenal glands are normal. Kidneys show no hydronephrosis. Foley catheter in the bladder which is decompressed. Right kidney appears slightly atrophic.  Stomach/Bowel: Stomach within normal limits aside from hiatal hernia which contains probable pill fragments. No dilated small bowel. Diverticular disease of the colon. Difficult to exclude subtle fat stranding and minimal wall thickening at the descending/proximal sigmoid colon, series 2, image 95. Negative appendix. Vascular/Lymphatic: Aortic atherosclerosis. No enlarged abdominal or pelvic lymph nodes. Reproductive: Uterus and bilateral adnexa are unremarkable. Other: Negative for pelvic effusion or free air. Musculoskeletal: Chronic superior endplate deformities at L1 and L3. suspect transitional anatomy at lumbosacral junction with lumbarized S1 segment. Spinal hardware at the spinous processes of the lower lumbar spine.1 no acute osseous abnormality. Chronic deformity of right hip with evidence of prior right hip hardware removal and chronic mixed soft tissue and fluid density at the right hip socket. Chronic erosive and remodeling change of the right acetabulum. IMPRESSION: 1. Diffuse bilateral bronchial wall thickening with narrowed appearance of airways consistent with airways inflammatory process and patchy mucous plugging most evident in the lower lobes. Scattered small foci of irregular consolidation and nodularity within the bilateral lower lobes, suspect for multifocal pneumonia or potential aspiration. 2. Diverticular disease of the colon. Difficult to exclude subtle fat stranding and minimal wall thickening at the descending/proximal sigmoid colon, as may be seen with mild diverticulitis. 3. Other chronic findings as described above 4. Aortic atherosclerosis. Aortic Atherosclerosis (ICD10-I70.0). Electronically Signed   By: Luke Bun M.D.   On: 12/10/2023 21:42   DG Chest Port 1 View Result Date: 12/10/2023 CLINICAL DATA:  Insert epic diagnosis and indication. EXAM: PORTABLE CHEST 1 VIEW COMPARISON:  Radiograph 10/13/2022 FINDINGS: Patient is chronically rotated. Stable heart size and  mediastinal contours. No focal airspace disease. No pleural effusion or pneumothorax. No acute osseous findings. Chronic shoulder arthropathy. IMPRESSION: No acute chest findings. Electronically Signed   By: Andrea Gasman M.D.   On: 12/10/2023 18:55    Microbiology: Results for orders placed or performed during the hospital encounter of 12/10/23  Blood Culture (routine x 2)     Status: None   Collection Time: 12/10/23  6:25 PM   Specimen: BLOOD  Result Value Ref Range Status   Specimen Description   Final    BLOOD LEFT ANTECUBITAL Performed at [facility], [address], [place], [place] [address]    Special Requests   Final    BOTTLES DRAWN AEROBIC AND ANAEROBIC Blood Culture adequate volume Performed at [facility], [address] [place], [place] [address]    Culture   Final    NO GROWTH 5 DAYS Performed at [facility], [address], [place], [place] [address]    Report Status 12/15/2023 FINAL  Final  Blood Culture (routine x 2)     Status: None   Collection Time: 12/10/23  6:26 PM   Specimen: BLOOD  Result Value Ref Range  Status   Specimen Description   Final    BLOOD RIGHT ANTECUBITAL Performed at [facility], [address] [place], [place] [address]    Special Requests   Final    BOTTLES DRAWN AEROBIC AND ANAEROBIC Blood Culture adequate volume Performed at [facility], [address] [place], [place] [address]    Culture   Final    NO GROWTH 5 DAYS Performed at [facility], [address] [place], [place] [address]    Report Status 12/15/2023 FINAL  Final  Urine Culture     Status: Abnormal   Collection Time: 12/10/23  6:27 PM   Specimen: Urine, Random  Result Value Ref Range Status   Specimen Description   Final    URINE, RANDOM Performed at [facility], [address] [place], [place] [address]    Special Requests   Final    NONE  Reflexed from [id number] Performed at [facility], [address] [place], [place] [address]    Culture >=100,000 COLONIES/mL KLEBSIELLA AEROGENES (A)  Final   Report Status 12/13/2023 FINAL  Final   Organism ID, Bacteria KLEBSIELLA AEROGENES (A)  Final      Susceptibility   Klebsiella aerogenes - MIC*    CEFEPIME  <=0.12 SENSITIVE Sensitive     ERTAPENEM <=0.12 SENSITIVE Sensitive     CEFTRIAXONE  <=0.25 SENSITIVE Sensitive     CIPROFLOXACIN  >=4 RESISTANT Resistant     GENTAMICIN <=1 SENSITIVE Sensitive     NITROFURANTOIN 64 INTERMEDIATE Intermediate     TRIMETH/SULFA <=20 SENSITIVE Sensitive     PIP/TAZO Value in next row Sensitive ug/mL     <=4 SENSITIVEThis is a modified FDA-approved test that has been validated and its performance characteristics determined by the reporting laboratory.  This laboratory is certified under the Clinical Laboratory Improvement Amendments CLIA as qualified to perform high complexity clinical laboratory testing.    MEROPENEM Value in next row Sensitive      <=4 SENSITIVEThis is a modified FDA-approved test that has been validated and its performance characteristics determined by the reporting laboratory.  This laboratory is certified under the Clinical Laboratory Improvement Amendments CLIA as qualified to perform high complexity clinical laboratory testing.    * >=100,000 COLONIES/mL KLEBSIELLA AEROGENES  Resp panel by RT-PCR (RSV, Flu A&B, Covid) Anterior Nasal Swab     Status: None   Collection Time: 12/10/23  6:35 PM   Specimen: Anterior Nasal Swab  Result Value Ref Range Status   SARS Coronavirus 2 by RT PCR NEGATIVE NEGATIVE Final    Comment: (NOTE) SARS-CoV-2 target nucleic acids are NOT DETECTED.  The SARS-CoV-2 RNA is generally detectable in upper respiratory specimens during the acute phase of infection. The lowest concentration of SARS-CoV-2 viral copies this assay can detect is 138 copies/mL. A negative result does not  preclude SARS-Cov-2 infection and should not be used as the sole basis for treatment or other patient management decisions. A negative result may occur with  improper specimen collection/handling, submission of specimen other than nasopharyngeal swab, presence of viral mutation(s) within the areas targeted by this assay, and inadequate number of viral copies(<138 copies/mL). A negative result must be combined with clinical observations, patient history, and epidemiological information. The expected result is Negative.  Fact Sheet for Patients:  BloggerCourse.com  Fact Sheet for Healthcare Providers:  SeriousBroker.it  This test is no t yet approved or cleared by the [place]  FDA and  has been authorized for  detection and/or diagnosis of SARS-CoV-2 by FDA under an Emergency Use Authorization (EUA). This EUA will remain  in effect (meaning this test can be used) for the duration of the COVID-19 declaration under Section 564(b)(1) of the Act, 21 U.S.C.section 360bbb-3(b)(1), unless the authorization is terminated  or revoked sooner.       Influenza A by PCR NEGATIVE NEGATIVE Final   Influenza B by PCR NEGATIVE NEGATIVE Final    Comment: (NOTE) The Xpert Xpress SARS-CoV-2/FLU/RSV plus assay is intended as an aid in the diagnosis of influenza from Nasopharyngeal swab specimens and should not be used as a sole basis for treatment. Nasal washings and aspirates are unacceptable for Xpert Xpress SARS-CoV-2/FLU/RSV testing.  Fact Sheet for Patients: BloggerCourse.com  Fact Sheet for Healthcare Providers: SeriousBroker.it  This test is not yet approved or cleared by the [place]  FDA and has been authorized for detection and/or diagnosis of SARS-CoV-2 by FDA under an Emergency Use Authorization (EUA). This EUA will remain in effect (meaning this test can be used) for the  duration of the COVID-19 declaration under Section 564(b)(1) of the Act, 21 U.S.C. section 360bbb-3(b)(1), unless the authorization is terminated or revoked.     Resp Syncytial Virus by PCR NEGATIVE NEGATIVE Final    Comment: (NOTE) Fact Sheet for Patients: BloggerCourse.com  Fact Sheet for Healthcare Providers: SeriousBroker.it  This test is not yet approved or cleared by the [place]  FDA and has been authorized for detection and/or diagnosis of SARS-CoV-2 by FDA under an Emergency Use Authorization (EUA). This EUA will remain in effect (meaning this test can be used) for the duration of the COVID-19 declaration under Section 564(b)(1) of the Act, 21 U.S.C. section 360bbb-3(b)(1), unless the authorization is terminated or revoked.  Performed at [facility] [address], [place], [place] [address]   MRSA Next Gen by PCR, Nasal     Status: None   Collection Time: 12/14/23  7:49 AM   Specimen: Nasal Mucosa; Nasal Swab  Result Value Ref Range Status   MRSA by PCR Next Gen NOT DETECTED NOT DETECTED Final    Comment: (NOTE) The GeneXpert MRSA Assay (FDA approved for NASAL specimens only), is one component of a comprehensive MRSA colonization surveillance program. It is not intended to diagnose MRSA infection nor to guide or monitor treatment for MRSA infections. Test performance is not FDA approved in patients less than 31 years old. Performed at [facility], [address] [place], [place] [address]     Labs: CBC: Recent Labs  Lab 12/10/23 1822 12/11/23 0442 12/12/23 0532 12/14/23 0534 12/15/23 0507 12/16/23 0650 12/17/23 0532  WBC 5.4   < > 6.8 9.1 9.4 8.3 9.4  NEUTROABS 3.9  --   --   --   --  5.3 6.3  HGB 10.6*   < > 11.1* 10.7* 10.6* 10.9* 9.9*  HCT 35.7*   < > 38.0 36.6 36.6 37.6 32.8*  MCV 92.5   < > 93.4 94.1 92.7 94.0 92.4  PLT 142*   < > 158 169 171 183 198    < > = values in this interval not displayed.   Basic Metabolic Panel: Recent Labs  Lab 12/11/23 0442 12/14/23 0534 12/15/23 0507 12/16/23 0650 12/17/23 0532  NA 131* 139 139 137 139  K 4.8 3.5 3.3* 3.6 3.6  CL 108 113* 113* 112* 110  CO2 17* 16* 17* 15* 18*  GLUCOSE 76 76 73 70 63*  BUN 31* 25* 24*  25* 24*  CREATININE 1.98* 2.20* 2.24* 2.45* 2.34*  CALCIUM 8.5* 8.5* 8.7* 8.5* 8.5*  MG  --   --   --  2.1 2.0   Liver Function Tests: Recent Labs  Lab 12/10/23 1822 12/16/23 0650 12/17/23 0532  AST 14* 11* 10*  ALT 15 11 10   ALKPHOS 111 82 76  BILITOT 0.5 0.5 0.4  PROT 7.1 6.2* 5.8*  ALBUMIN 3.5 3.0* 2.7*   CBG: Recent Labs  Lab 12/10/23 1811 12/10/23 1854 12/10/23 1957 12/10/23 2333 12/16/23 1618  GLUCAP 69* 79 101* 79 109*    Discharge time spent: greater than 30 minutes.  Signed: Zachary JINNY Ba, MD [facility] 12/17/2023

## 2023-12-17 NOTE — Plan of Care (Signed)
   Problem: Education: Goal: Knowledge of General Education information will improve Description: Including pain rating scale, medication(s)/side effects and non-pharmacologic comfort measures Outcome: Progressing   Problem: Health Behavior/Discharge Planning: Goal: Ability to manage health-related needs will improve Outcome: Progressing   Problem: Clinical Measurements: Goal: Will remain free from infection Outcome: Progressing

## 2023-12-17 NOTE — TOC Transition Note (Signed)
 Transition of Care [facility]) - Discharge Note   Patient Details  Name: Madison [name] MRN: [id number] Date of Birth: Sep 30, 1940  Transition of Care [facility]) CM/SW Contact:  Sonda Manuella Quill, RN Phone Number: 12/17/2023, 10:35 AM   Clinical Narrative:    D/C orders received; pt from [facility]; spoke w/ Tijuan at facility; she gave [facility] on [facility], [place] # [place])  call report # [phone], ext [phone]; transport by [facility]; notified pt/husband Madison [name] [phone]); they agreed to d/c plan; [facility] called at 1058; spoke w/ Operator # [phone]; no TOC needs.   Final next level of care: Skilled Nursing Facility Barriers to Discharge: No Barriers Identified   Patient Goals and CMS Choice Patient states their goals for this hospitalization and ongoing recovery are:: [facility] CMS Medicare.gov Compare Post Acute Care list provided to:: Patient Choice offered to / list presented to : Patient [place] ownership interest in [facility].provided to:: Patient    Discharge Placement                Patient to be transferred to facility by: PTAR Name of family member notified: Madison [name] (spouse) [phone] Patient and family notified of of transfer: 12/17/23  Discharge Plan and Services Additional resources added to the After Visit Summary for       Post Acute Care Choice: Nursing Home                               Social Drivers of Health (SDOH) Interventions SDOH Screenings   Food Insecurity: No Food Insecurity (12/11/2023)  Housing: Low Risk  (12/11/2023)  Transportation Needs: No Transportation Needs (12/11/2023)  Utilities: Not At Risk (12/11/2023)  Depression (PHQ2-9): Low Risk  (02/21/2023)  Financial Resource Strain: Low Risk  (01/16/2021)   Received from [facility]  Social Connections: Socially Isolated (12/11/2023)  Stress: No Stress Concern Present (01/13/2021)   Received from [facility]  Tobacco  Use: Medium Risk (12/10/2023)     Readmission Risk Interventions    10/17/2022   12:09 PM  Readmission Risk Prevention Plan  Transportation Screening Complete  Home Care Screening Complete  Medication Review (RN CM) Complete

## 2023-12-17 NOTE — Progress Notes (Signed)
 Report called to [facility] at [phone] ext. [phone]

## 2023-12-18 ENCOUNTER — Encounter: Payer: Self-pay | Admitting: Orthopedic Surgery

## 2023-12-18 ENCOUNTER — Non-Acute Institutional Stay (SKILLED_NURSING_FACILITY): Payer: Self-pay | Admitting: Orthopedic Surgery

## 2023-12-18 DIAGNOSIS — J189 Pneumonia, unspecified organism: Secondary | ICD-10-CM | POA: Diagnosis not present

## 2023-12-18 DIAGNOSIS — G3184 Mild cognitive impairment, so stated: Secondary | ICD-10-CM

## 2023-12-18 DIAGNOSIS — I5032 Chronic diastolic (congestive) heart failure: Secondary | ICD-10-CM | POA: Diagnosis not present

## 2023-12-18 DIAGNOSIS — N179 Acute kidney failure, unspecified: Secondary | ICD-10-CM

## 2023-12-18 DIAGNOSIS — R062 Wheezing: Secondary | ICD-10-CM

## 2023-12-18 DIAGNOSIS — N184 Chronic kidney disease, stage 4 (severe): Secondary | ICD-10-CM

## 2023-12-18 DIAGNOSIS — L270 Generalized skin eruption due to drugs and medicaments taken internally: Secondary | ICD-10-CM

## 2023-12-18 DIAGNOSIS — T360X5A Adverse effect of penicillins, initial encounter: Secondary | ICD-10-CM

## 2023-12-18 DIAGNOSIS — I10 Essential (primary) hypertension: Secondary | ICD-10-CM

## 2023-12-18 DIAGNOSIS — G4733 Obstructive sleep apnea (adult) (pediatric): Secondary | ICD-10-CM

## 2023-12-18 DIAGNOSIS — K2100 Gastro-esophageal reflux disease with esophagitis, without bleeding: Secondary | ICD-10-CM

## 2023-12-18 DIAGNOSIS — E039 Hypothyroidism, unspecified: Secondary | ICD-10-CM

## 2023-12-18 NOTE — Progress Notes (Signed)
 Location:  [facility] Room Number: N056-A Place of Service:  SNF (31) Provider:  Gil Greig Madison [name]  Mast, Madison X, NP  Patient Care Team: Mast, Madison X, NP as PCP - General (Internal Medicine) Madison Leim DEL, MD as PCP - Cardiology (Cardiology) Madison Wilbert SAUNDERS, MD as PCP - Sleep Medicine (Cardiology) Madison Lavelle BROCKS., MD (Sports Medicine) Madison Lamar PARAS, MD (Ophthalmology) [name], Madison Greener, MD (Dermatology) [name], MD (Gastroenterology) [name], [name], MD (Nephrology) Madison [name], DC as Referring Physician (Chiropractic Medicine)  Extended Emergency Contact Information Primary Emergency Contact: Madison [name] Address: [address]          [place], [place] [address] [place] Home Phone: [phone] Mobile Phone: [phone] Relation: Spouse  Code Status:  DNR Goals of care: Advanced Directive information    12/18/2023    1:15 PM  Advanced Directives  Does Patient Have a Medical Advance Directive? Yes  Type of Advance Directive Living will;Out of facility DNR (pink MOST or yellow form)  Does patient want to make changes to medical advance directive? No - Patient declined     Chief Complaint  Patient presents with   [facility] follow up.    HPI:  Pt is a 83 y.o. female seen today for f/u s/p hospitalization 08/17-08/24 due to pneumonia.   She currently resides on the skilled nursing unit at [facility]. 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   08/17 she had increased confusion. Facility was unable to obtain urine test within timely manner. CT chest revealed concerns for multifocal pneumonia. Overall labs were unremarkable. Underlying encephalopathy improved with pneumonia treatment. Her recovery was complicated by  diffuse erythematous rash all over her body, including eyes. Rash thought to be associated with IV Unasyn . She was also given polytrim  eyedrops and benadryl  prn. She was transitioned to doxycycline  and rash slowly began to improve.  She also had intermittent hypothermia. Thyroid  studies unremarkable. Hypothermia resolved on its own. She was discharged back to [facility].   Today, she denies chest pain, sob, cough. She has had some wheezing in the AM. She is using incentive spirometer. Appetite improving every day. She is still having loose stools but attributes that to doxycyline. She is taking Librarian, academic daily. No confusion per nursing. No rash to face. Rash to extremities has improved. Vitals stable.    Past Medical History:  Diagnosis Date   Benign hypertension with chronic kidney disease, stage III (HCC)    Overview:  Last Assessment & Plan:  Usually the patient is hypertensive. However today her blood pressure is 105 systolic. She has been feeling fatigued with some lightheadedness. Part of this may be related to her lower blood pressure. Her pressure may be improved with her decrease in salt and fluid intake. I've instructed her to put her lisinopril/hctz on hold. I've asked her to see her primary    Cardiac disease 03/10/2014   Chronic diarrhea 07/27/2015   Edema 07/27/2015   Ejection fraction 2004   Normal, echo,    Gastroesophageal reflux disease    GERD (gastroesophageal reflux disease)    Heart disease 03/10/2014   Hypertension    Left bundle branch block 09/01/2020   Obesity    Obesity (BMI 30-39.9) 09/25/2017   OSA (obstructive sleep apnea)    mild with AHI 6.75 - on CPAP  Preop cardiovascular exam 11/2010   Cardiac clearance for knee surgery    Sleep apnea 03/10/2014   Supraventricular tachycardia (HCC)    Documented episode in the past, possibly reentrant tachycardia  Overview:  Overview:  Documented episode in the past, possibly reentrant tachycardia  Last Assessment & Plan:  The  patient had a documented episode in the past of a supraventricular tachycardia. It was possibly reentrant. She does well with diltiazem . No change in therapy.   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   TOTAL KNEE ARTHROPLASTY Right 06/13/2011    Allergies  Allergen Reactions   Ciprofloxacin  Other (See Comments)    Unknown    Keflex [Cephalexin] Diarrhea   Macrobid [Nitrofurantoin] Hives   Amoxicillin Rash    Has since tolerated Zosyn  inpatient   Cefadroxil  Rash    Allergy not listed on [facility]     Outpatient Encounter Medications as of 12/18/2023  Medication Sig   acetaminophen  (TYLENOL ) 325 MG tablet Take 650 mg by mouth every 4 (four) hours as needed for fever, moderate pain (pain score 4-6) or mild pain (pain score 1-3).   albuterol  (VENTOLIN  HFA) 108 (90 Base) MCG/ACT inhaler Inhale 2 puffs into the lungs every 6 (six) hours as needed for wheezing or shortness of breath.   artificial tears ophthalmic solution Place 2 drops into both eyes as needed for dry eyes.   bifidobacterium infantis (ALIGN) capsule Take 1 capsule by mouth daily.   Calcium Carb-Cholecalciferol  (CALCIUM PLUS VITAMIN D3 PO) Take 1 tablet by mouth daily. Vitamin D3 20mcg + Calcium 600   Cholecalciferol  (VITAMIN D3) 50 MCG (2000 UT) TABS Take 4,000 Units by mouth daily.   Cranberry 500 MG TABS Take 500 mg by mouth in the morning and at bedtime.   docusate sodium  (COLACE) 100 MG capsule Take 100 mg by mouth daily as needed for mild  constipation or moderate constipation.   doxycycline  (VIBRA -TABS) 100 MG tablet Take 1 tablet (100 mg total) by mouth every 12 (twelve) hours for 5 days.   ferrous sulfate  325 (65 FE) MG EC tablet Take 325 mg by mouth daily.   furosemide  (LASIX ) 20 MG tablet Take 20 mg by mouth daily.   guaiFENesin -dextromethorphan (ROBITUSSIN DM) 100-10 MG/5ML syrup Take 10 mLs by mouth every 4 (four) hours as needed for cough.   hydrocortisone  cream 1 % Apply 1 Application topically See admin instructions. Apply to urogenital area topically at bedtime every other day for itchy areas related to vaginitis.   ipratropium-albuterol  (DUONEB) 0.5-2.5 (3) MG/3ML SOLN Inhale 3 mLs into the lungs every 6 (six) hours as needed (wheezing). And every 8 hours as needed per [facility]   ketoconazole (NIZORAL) 2 % cream Apply 1 Application topically 2 (two) times daily. Apply to peri area   ketotifen  (ZADITOR ) 0.035 % ophthalmic solution Place 1 drop into both eyes 2 (two) times daily.   levothyroxine  (SYNTHROID ) 100 MCG tablet Take 100 mcg by  mouth daily before breakfast.   loperamide (IMODIUM) 2 MG capsule Take 2 mg by mouth every 4 (four) hours as needed for diarrhea or loose stools.   loratadine  (CLARITIN ) 10 MG tablet Take 10 mg by mouth daily.   Nystatin (GERHARDT'S BUTT CREAM) CREA Apply 1 Application topically 2 (two) times daily. Apply to excoriated areas of groin/perineum   nystatin (MYCOSTATIN/NYSTOP) powder Apply 1 application  topically in the morning, at noon, in the evening, and at bedtime. To genital areas and inner thigh   ondansetron  (ZOFRAN ) 4 MG tablet Take 4 mg by mouth every 8 (eight) hours as needed for nausea or vomiting.   pantoprazole  (PROTONIX ) 40 MG tablet Take 40 mg by mouth daily.   potassium chloride  (KLOR-CON  M) 10 MEQ tablet Take 30 mEq by mouth daily.   traMADol (ULTRAM) 50 MG tablet Take 50 mg by mouth every 6 (six) hours as needed for moderate pain (pain score 4-6) or severe pain  (pain score 7-10).   triamcinolone cream (KENALOG) 0.1 % Apply 1 Application topically 2 (two) times daily. Apply to genital area   triamcinolone cream (KENALOG) 0.5 % Apply 1 Application topically at bedtime. Apply to lower back skin folds rash   trimethoprim -polymyxin b  (POLYTRIM ) ophthalmic solution Place 2 drops into both eyes every 6 (six) hours for 6 days.   No facility-administered encounter medications on file as of 12/18/2023.    Review of Systems  Constitutional:  Negative for fatigue and fever.  HENT:  Positive for trouble swallowing. Negative for sore throat.   Eyes:  Negative for visual disturbance.  Respiratory:  Positive for wheezing. Negative for cough and shortness of breath.   Cardiovascular:  Positive for leg swelling. Negative for chest pain.  Gastrointestinal:  Positive for diarrhea. Negative for abdominal distention, abdominal pain, constipation, nausea and vomiting.  Genitourinary:  Negative for hematuria.  Musculoskeletal:  Positive for gait problem.  Skin:  Positive for wound.  Neurological:  Positive for weakness. Negative for dizziness, light-headedness and headaches.  Psychiatric/Behavioral:  Positive for confusion. Negative for dysphoric mood and sleep disturbance. The patient is not nervous/anxious.     Immunization History  Administered Date(s) Administered   Fluzone Influenza virus vaccine,trivalent (IIV3), split virus 02/20/2012, 02/11/2013, 02/02/2016, 01/11/2017, 02/04/2019   INFLUENZA, HIGH DOSE SEASONAL PF 02/04/2015, 01/11/2017, 01/30/2018, 02/05/2020, 01/25/2023   Influenza Split 01/28/2008, 02/17/2009   Influenza-Unspecified 02/16/2022   Moderna Covid-19 Vaccine Bivalent Booster 37yrs & up 04/13/2023   Moderna SARS-COV2 Booster Vaccination 02/24/2022   Moderna Sars-Covid-2 Vaccination 04/29/2019, 05/09/2019, 05/27/2019, 03/03/2020, 09/04/2020, 11/04/2020   PPD Test 02/05/2021, 02/19/2021, 07/14/2022, 10/19/2022   Pfizer Covid-19 Vaccine Bivalent  Booster 67yrs & up 02/09/2021   Pneumococcal Conjugate-13 08/30/2013   Pneumococcal Polysaccharide-23 11/03/2005   RSV,unspecified 04/22/2022   Td 08/01/2001, 09/05/2019   Td (Adult) 08/01/2001   Tdap 09/22/2010, 11/24/2020   Zoster Recombinant(Shingrix) 11/08/2016, 01/11/2017   Zoster, Live 11/04/2005, 11/08/2016, 01/11/2017   Zoster, Unspecified 01/11/2017   Pertinent  Health Maintenance Due  Topic Date Due   INFLUENZA VACCINE  12/25/2023 (Originally 11/24/2023)   DEXA SCAN  Completed      04/15/2022   10:15 AM 04/27/2022   11:35 AM 05/31/2022    9:34 AM 07/26/2022    9:31 AM 10/21/2022    9:31 AM  Fall Risk  Falls in the past year? 0 0 0 0 0  Was there an injury with Fall? 0 0 0 0 0  Fall Risk Category Calculator 0 0 0 0 0  Fall  Risk Category (Retired) Low  Low      (RETIRED) Patient Fall Risk Level Moderate fall risk  Moderate fall risk      Patient at Risk for Falls Due to History of fall(s) History of fall(s) History of fall(s)  No Fall Risks  Fall risk Follow up Falls evaluation completed  Falls evaluation completed  Falls evaluation completed  Falls evaluation completed     Data saved with a previous flowsheet row definition   Functional Status Survey:    Vitals:   12/18/23 1313  BP: (!) 133/55  Pulse: 76  Resp: 18  Temp: 98.1 F (36.7 C)  SpO2: 95%  Weight: 291 lb (132 kg)  Height: 5' 8 (1.727 m)   Body mass index is 44.25 kg/m. Physical Exam Vitals reviewed.  Constitutional:      General: She is not in acute distress.    Appearance: She is obese.  HENT:     Head: Normocephalic.  Eyes:     General:        Right eye: No discharge.        Left eye: No discharge.  Cardiovascular:     Rate and Rhythm: Normal rate and regular rhythm.     Pulses: Normal pulses.     Heart sounds: Normal heart sounds.  Pulmonary:     Effort: Pulmonary effort is normal. No respiratory distress.     Breath sounds: Wheezing present. No rhonchi or rales.  Abdominal:      General: Bowel sounds are normal. There is no distension.     Palpations: Abdomen is soft.     Tenderness: There is no abdominal tenderness.  Musculoskeletal:     Cervical back: Neck supple.     Right lower leg: Edema present.     Left lower leg: Edema present.     Comments: BLE 1-2+ pitting edema  Skin:    General: Skin is warm.     Capillary Refill: Capillary refill takes less than 2 seconds.     Comments: Skin tear to left wrist, CDI  Neurological:     General: No focal deficit present.     Mental Status: She is alert. Mental status is at baseline.     Gait: Gait abnormal.  Psychiatric:        Mood and Affect: Mood normal.     Labs reviewed: Recent Labs    12/15/23 0507 12/16/23 0650 12/17/23 0532  NA 139 137 139  K 3.3* 3.6 3.6  CL 113* 112* 110  CO2 17* 15* 18*  GLUCOSE 73 70 63*  BUN 24* 25* 24*  CREATININE 2.24* 2.45* 2.34*  CALCIUM 8.7* 8.5* 8.5*  MG  --  2.1 2.0   Recent Labs    12/10/23 1822 12/16/23 0650 12/17/23 0532  AST 14* 11* 10*  ALT 15 11 10   ALKPHOS 111 82 76  BILITOT 0.5 0.5 0.4  PROT 7.1 6.2* 5.8*  ALBUMIN 3.5 3.0* 2.7*   Recent Labs    12/10/23 1822 12/11/23 0442 12/15/23 0507 12/16/23 0650 12/17/23 0532  WBC 5.4   < > 9.4 8.3 9.4  NEUTROABS 3.9  --   --  5.3 6.3  HGB 10.6*   < > 10.6* 10.9* 9.9*  HCT 35.7*   < > 36.6 37.6 32.8*  MCV 92.5   < > 92.7 94.0 92.4  PLT 142*   < > 171 183 198   < > = values in this interval not displayed.   Lab Results  Component Value Date   TSH 5.752 (H) 12/16/2023   No results found for: HGBA1C No results found for: CHOL, HDL, LDLCALC, LDLDIRECT, TRIG, CHOLHDL  Significant Diagnostic Results in last 30 days:  DG Chest 1 View Result Date: 12/16/2023 CLINICAL DATA:  Pneumonia. EXAM: CHEST  1 VIEW COMPARISON:  12/10/2023. FINDINGS: The heart is enlarged the mediastinal contour is stable. There is atherosclerotic calcification of the aorta. The pulmonary vasculature is mildly  distended. No consolidation, effusion, or pneumothorax is seen. No acute osseous abnormality. IMPRESSION: 1. No focal infiltrates are identified. 2. Cardiomegaly with distended pulmonary vasculature. Electronically Signed   By: Leita Birmingham M.D.   On: 12/16/2023 14:13   CT CHEST ABDOMEN PELVIS WO CONTRAST Result Date: 12/10/2023 CLINICAL DATA:  Sepsis possible UTI EXAM: CT CHEST, ABDOMEN AND PELVIS WITHOUT CONTRAST TECHNIQUE: Multidetector CT imaging of the chest, abdomen and pelvis was performed following the standard protocol without IV contrast. RADIATION DOSE REDUCTION: This exam was performed according to the departmental dose-optimization program which includes automated exposure control, adjustment of the mA and/or kV according to patient size and/or use of iterative reconstruction technique. COMPARISON:  Chest [name]-ray 12/10/2023, CT 01/10/2023, chest CT 07/02/2022, hip CT 07/11/2022, 06/08/2021 FINDINGS: CT CHEST FINDINGS Cardiovascular: Limited without intravenous contrast. Mild aortic atherosclerosis. No aneurysm. Multi vessel coronary vascular calcification. Cardiomegaly. No pericardial effusion. Mediastinum/Nodes: Patent trachea no thyroid  mass. No suspicious lymph nodes. Moderate large hiatal hernia. Lungs/Pleura: Mild diffuse bilateral mosaic density likely due to airways disease. There is diffuse bilateral bronchial wall thickening with narrowed appearance of airways. Patchy mucous plugging most evident in the lower lobes. Scattered small foci of irregular consolidation and nodularity within the bilateral lower lobes. Bandlike scarring or atelectasis in both lower lobes. No sizable pleural effusion Musculoskeletal: Sternum appears intact. Multilevel degenerative changes. No acute osseous abnormality. CT ABDOMEN PELVIS FINDINGS Hepatobiliary: No focal liver abnormality is seen. Status post cholecystectomy. No biliary dilatation. Pancreas: Unremarkable. No pancreatic ductal dilatation or surrounding  inflammatory changes. Spleen: Normal in size without focal abnormality. Adrenals/Urinary Tract: Adrenal glands are normal. Kidneys show no hydronephrosis. Foley catheter in the bladder which is decompressed. Right kidney appears slightly atrophic. Stomach/Bowel: Stomach within normal limits aside from hiatal hernia which contains probable pill fragments. No dilated small bowel. Diverticular disease of the colon. Difficult to exclude subtle fat stranding and minimal wall thickening at the descending/proximal sigmoid colon, series 2, image 95. Negative appendix. Vascular/Lymphatic: Aortic atherosclerosis. No enlarged abdominal or pelvic lymph nodes. Reproductive: Uterus and bilateral adnexa are unremarkable. Other: Negative for pelvic effusion or free air. Musculoskeletal: Chronic superior endplate deformities at L1 and L3. suspect transitional anatomy at lumbosacral junction with lumbarized S1 segment. Spinal hardware at the spinous processes of the lower lumbar spine.1 no acute osseous abnormality. Chronic deformity of right hip with evidence of prior right hip hardware removal and chronic mixed soft tissue and fluid density at the right hip socket. Chronic erosive and remodeling change of the right acetabulum. IMPRESSION: 1. Diffuse bilateral bronchial wall thickening with narrowed appearance of airways consistent with airways inflammatory process and patchy mucous plugging most evident in the lower lobes. Scattered small foci of irregular consolidation and nodularity within the bilateral lower lobes, suspect for multifocal pneumonia or potential aspiration. 2. Diverticular disease of the colon. Difficult to exclude subtle fat stranding and minimal wall thickening at the descending/proximal sigmoid colon, as may be seen with mild diverticulitis. 3. Other chronic findings as described above 4. Aortic atherosclerosis. Aortic Atherosclerosis (ICD10-I70.0). Electronically Signed  By: Luke Bun M.D.   On:  12/10/2023 21:42   DG Chest Port 1 View Result Date: 12/10/2023 CLINICAL DATA:  Insert epic diagnosis and indication. EXAM: PORTABLE CHEST 1 VIEW COMPARISON:  Radiograph 10/13/2022 FINDINGS: Patient is chronically rotated. Stable heart size and mediastinal contours. No focal airspace disease. No pleural effusion or pneumothorax. No acute osseous findings. Chronic shoulder arthropathy. IMPRESSION: No acute chest findings. Electronically Signed   By: Andrea Gasman M.D.   On: 12/10/2023 18:55    Assessment/Plan 1. Multifocal pneumonia (Primary) - hospitalized 08/17-08/24> increased confusion prior to stay - CT chest confirmed multifocal PNA - allergic reaction to IV unasyn > rash to face,body, eyes - lung sounds clear - cont incentive spirometer - cont doxycycline   2. Wheezing - noted QAM - has duonebs prn> not asking for them  - schedule duonebs QAM [name] 2 days   3. Chronic heart failure with preserved ejection fraction (HFpEF) (HCC) - LVEF 70-75% 06/2022 - no cough, weight gain, pitting edema present  - cont furosemide /KCL  4. Acute renal failure superimposed on stage 4 chronic kidney disease, unspecified acute renal failure type (HCC) - BUN/creat 24/2.34, GFR 20 12/17/2023 - average creat 2.0 - see above - repeat bmp 09/02  5. Acquired hypothyroidism - intermittent hypothermia last hospitalization - TSH 5.75 08/23 - cont levothyroxine   6. Penicillin-induced allergic rash - associated with IV unasyn  - resolved with benadryl  and polytrim  eye drops   7. MCI (mild cognitive impairment) - MMSE 29/30 01/2023 - dependent with some ADLs - no behaviors - not on medication  8. Essential hypertension - controlled without medication  9. Gastroesophageal reflux disease with esophagitis without hemorrhage - hgb stable - cont pantoprazole   10. Obstructive sleep apnea - cont CPAP qhs    Family/ staff Communication: plan discussed with patient and nurse  Labs/tests ordered:   bmp 09/02

## 2023-12-26 LAB — BASIC METABOLIC PANEL WITH GFR
BUN: 48 — AB (ref 4–21)
CO2: 18 (ref 13–22)
Chloride: 114 — AB (ref 99–108)
Creatinine: 2.3 — AB (ref 0.5–1.1)
Glucose: 58
Potassium: 4.7 meq/L (ref 3.5–5.1)
Sodium: 140 (ref 137–147)

## 2023-12-26 LAB — COMPREHENSIVE METABOLIC PANEL WITH GFR
Calcium: 8.6 — AB (ref 8.7–10.7)
eGFR: 21

## 2023-12-29 ENCOUNTER — Encounter: Payer: Self-pay | Admitting: Nurse Practitioner

## 2023-12-29 ENCOUNTER — Non-Acute Institutional Stay (SKILLED_NURSING_FACILITY): Payer: Self-pay | Admitting: Nurse Practitioner

## 2023-12-29 DIAGNOSIS — G3184 Mild cognitive impairment, so stated: Secondary | ICD-10-CM | POA: Diagnosis not present

## 2023-12-29 DIAGNOSIS — I5033 Acute on chronic diastolic (congestive) heart failure: Secondary | ICD-10-CM | POA: Diagnosis not present

## 2023-12-29 DIAGNOSIS — K2100 Gastro-esophageal reflux disease with esophagitis, without bleeding: Secondary | ICD-10-CM

## 2023-12-29 DIAGNOSIS — M545 Low back pain, unspecified: Secondary | ICD-10-CM

## 2023-12-29 DIAGNOSIS — D649 Anemia, unspecified: Secondary | ICD-10-CM

## 2023-12-29 DIAGNOSIS — J189 Pneumonia, unspecified organism: Secondary | ICD-10-CM

## 2023-12-29 DIAGNOSIS — E039 Hypothyroidism, unspecified: Secondary | ICD-10-CM

## 2023-12-29 DIAGNOSIS — N184 Chronic kidney disease, stage 4 (severe): Secondary | ICD-10-CM | POA: Diagnosis not present

## 2023-12-29 DIAGNOSIS — I10 Essential (primary) hypertension: Secondary | ICD-10-CM

## 2023-12-29 DIAGNOSIS — Z89621 Acquired absence of right hip joint: Secondary | ICD-10-CM

## 2023-12-29 LAB — CBC AND DIFFERENTIAL
HCT: 35 — AB (ref 36–46)
Hemoglobin: 11.1 — AB (ref 12.0–16.0)
Platelets: 239 K/uL (ref 150–400)
WBC: 13.4

## 2023-12-29 LAB — HEPATIC FUNCTION PANEL
ALT: 9 U/L (ref 7–35)
AST: 13 (ref 13–35)
Alkaline Phosphatase: 119 (ref 25–125)
Bilirubin, Total: 0.3

## 2023-12-29 LAB — BASIC METABOLIC PANEL WITH GFR
BUN: 42 — AB (ref 4–21)
CO2: 15 (ref 13–22)
Chloride: 109 — AB (ref 99–108)
Creatinine: 2.1 — AB (ref 0.5–1.1)
Glucose: 95
Potassium: 4.6 meq/L (ref 3.5–5.1)
Sodium: 136 — AB (ref 137–147)

## 2023-12-29 LAB — COMPREHENSIVE METABOLIC PANEL WITH GFR
Albumin: 3.4 — AB (ref 3.5–5.0)
Calcium: 8.7 (ref 8.7–10.7)
Globulin: 3
eGFR: 23

## 2023-12-29 LAB — CBC: RBC: 4.04 (ref 3.87–5.11)

## 2023-12-29 NOTE — Assessment & Plan Note (Signed)
 followed by Nephrology, Bun/creat 48/2.27 12/26/23

## 2023-12-29 NOTE — Assessment & Plan Note (Signed)
 Resides in SNF [facility] for care needs.

## 2023-12-29 NOTE — Assessment & Plan Note (Signed)
 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 TDWB. Power wc. 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 Fully treated, then off antibiotics. 11/17/22 ID no infection, no tx, healed.  Lateral R hip/thigh, more swelling, mild warmth, erythema, no pain palpated, observe

## 2023-12-29 NOTE — Assessment & Plan Note (Signed)
 Blood pressure runs low, takes furosemide 

## 2023-12-29 NOTE — Assessment & Plan Note (Addendum)
 Cough, wheezing, update CXR ap/lateral, CBC/diff

## 2023-12-29 NOTE — Assessment & Plan Note (Signed)
 cough, wheezing, weight gained about #10Ibs in the past month, noted increased edema BLE, R>L. Increase Furosemide  to bid x 3 days Update CXR ap/lateral, BNP

## 2023-12-29 NOTE — Assessment & Plan Note (Signed)
 stable, on  Pantoprazole, off  Omeprazole, prn Zofran.

## 2023-12-29 NOTE — Progress Notes (Signed)
 Location:   SNF [facility] Room Number: [id number] Place of Service:  SNF (31) Provider: Larwance Astor Gentle NP  Madison Nall X, NP  Patient Care Team: Madison Slocumb X, NP as PCP - General (Internal Medicine) Madison [name] DEL, MD as PCP - Cardiology (Cardiology) Madison Wilbert SAUNDERS, MD as PCP - Sleep Medicine (Cardiology) Madison Lavelle BROCKS., MD (Sports Medicine) Madison [name] PARAS, MD (Ophthalmology) [name], [name], MD (Dermatology) Madison Lamar, MD (Gastroenterology) [name], [name], MD (Nephrology) Madison [name], DC as Referring Physician (Chiropractic Medicine)  Extended Emergency Contact Information Primary Emergency Contact: Madison [name] Address: [address]          [place], [place] [address] [place] Home Phone: [phone] Mobile Phone: [phone] Relation: Spouse  Code Status: DNR Goals of care: Advanced Directive information    12/18/2023    1:15 PM  Advanced Directives  Does Patient Have a Medical Advance Directive? Yes  Type of Advance Directive Living will;Out of facility DNR (pink MOST or yellow form)  Does patient want to make changes to medical advance directive? No - Patient declined     Chief Complaint  Patient presents with   Acute Visit    Coughing, wheezing, swelling, lower back pain    HPI:  Pt is a 83 y.o. female seen today for an acute visit for cough, wheezing, weight gained about #10Ibs in the past month, noted increased edema BLE, R>L. C/o lower back pain, recurrent, no spinal spinous process tenderness noted. She is afebrile, no O2 desaturation.    Hospitalized 12/10/2023 to 12/17/2023 for multifocal pneumonia, acute metabolic encephalopathy, CT chest, abd, pelvis revealed concerns for multifocal PNA. Residual diffused erythema 2/2 a drug reaction, no certain of the culprit medication.    lower back pain, prn Tramadol and Tylenol  available to her, [name]-ray lumbar spine, sacrococcyx 06/08/23 negative fx.               Hospitalized 01/10/23-01/12/23 for UTI, diverticulitis, treated with Zosyn , Cefadroxil , Flagyl , developed pruritic rash, then ID changed to Cipro  and Flagyl -complete 01/19/23, last colonoscopy 2018. Has x1 Fosfomycin.              Hospitalized for AKI 10/13/22-10/19/22 for AKI             Hospitalized 07/08/22 for CHF, abscess of the right hip which previous infected, treated, and prosthesis was removed and never replaced.               Hospitalized 07/02/22-07/07/22 for the right hip cellulitis, CT hip in hospital fluid collection unclear if it communicates with the joint space, Ortho not felt to be related to the joint space and may be an old fluid collection, aspiration not recommended. 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 TDWB. Power wc. 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 Fully treated, then off antibiotics. 11/17/22 ID no infection, no tx, healed.               Elevated AST/ALT normalized, S/p cholecystectomy, US  02/19/21 no cyst or mass.        CKD stage 4, followed by Nephrology, Bun/creat 48/2.27 12/26/23  Anemia, post op, s/p EGD no active bleed. S/p 6 u PRBC transfusion, ASA was dc'd, On Fe, Iron 61, Vit B12 216, Hgb 9.9 12/17/23, adding Vit B12 1000mcg every day.              Muscle spasm, left lower back, resolved, off Gabapentin , failed Methocarbamol              HTN on Furosemide , off Spironolactone .              Morbid obesity             OSA CPAP             Chronic diarrhea, prn Imodium, Align             RA f/u Rheumatology, hx of Plaquenil use, stable presently.              Insomnia/depression/anxiety, stable, off Mirtazapine .              GERD, stable, on  Pantoprazole , off  Omeprazole, prn Zofran .             CHF/Edema, taking Furosemide , weight gained  about #10Ibs in the past month, cough, wheezing Hyponatremia, Na 140 12/26/23             Hypothyroidism, TSH 5.752 12/16/23,  on Levothyroxine .   MCI, resides in SNF [facility] for care needs.                       Past Medical History:  Diagnosis Date   Benign hypertension with chronic kidney disease, stage III (HCC)    Overview:  Last Assessment & Plan:  Usually the patient is hypertensive. However today her blood pressure is 105 systolic. She has been feeling fatigued with some lightheadedness. Part of this may be related to her lower blood pressure. Her pressure may be improved with her decrease in salt and fluid intake. I've instructed her to put her lisinopril/hctz on hold. 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 It was possibly reentrant. She does well with diltiazem . No change in therapy.   SVT (supraventricular tachycardia) (HCC)    Documented episode in the past, possibly reentrant tachycardia   Thyroid  disease    Urinary incontinence    Past Surgical History:  Procedure Laterality Date   CATARACT EXTRACTION Left 8/11   CATARACT EXTRACTION Right 3/12   CHOLECYSTECTOMY  1994   Copsilotomy Laser Treatment Left  6/12   eye   ELBOW SURGERY  8/12   EYE SURGERY Left 09/2008   macular hole   EYE SURGERY Right 12/11   Lumbar Infusion  2011  MOLE REMOVAL  06/17/2015   TOTAL KNEE ARTHROPLASTY Left 6/11   TOTAL KNEE ARTHROPLASTY Right 06/13/2011    Allergies  Allergen Reactions   Ciprofloxacin  Other (See Comments)    Unknown    Keflex [Cephalexin] Diarrhea   Macrobid [Nitrofurantoin] Hives   Amoxicillin Rash    Has since tolerated Zosyn  inpatient   Cefadroxil  Rash    Allergy not listed on [facility]     Allergies as of 12/29/2023       Reactions   Ciprofloxacin  Other (See Comments)   Unknown    Keflex [cephalexin] Diarrhea   Macrobid [nitrofurantoin] Hives   Amoxicillin Rash   Has since tolerated Zosyn  inpatient   Cefadroxil  Rash   Allergy not listed on [facility]         Medication List        Accurate as of December 29, 2023 11:59 PM. If you have any questions, ask your nurse or doctor.          acetaminophen  325 MG tablet Commonly known as: TYLENOL  Take 650 mg by mouth every 4 (four) hours as needed for fever, moderate pain (pain score 4-6) or mild pain (pain score 1-3).   albuterol  108 (90 Base) MCG/ACT inhaler Commonly known as: VENTOLIN  HFA Inhale 2 puffs into the lungs every 6 (six) hours as needed for wheezing or shortness of breath.   artificial tears ophthalmic solution Place 2 drops into both eyes as needed for dry eyes.   bifidobacterium infantis capsule Take 1 capsule by mouth daily.   CALCIUM PLUS VITAMIN D3 PO Take 1 tablet by mouth daily. Vitamin D3 20mcg + Calcium 600   Cranberry 500 MG Tabs Take 500 mg by mouth in the morning and at bedtime.   docusate sodium  100 MG capsule Commonly known as: COLACE Take 100 mg by mouth daily as needed for mild constipation or moderate constipation.   ferrous sulfate  325 (65 FE) MG EC tablet Take 325 mg by mouth daily.   furosemide  20 MG tablet Commonly known as: LASIX  Take 20 mg by mouth daily.   Gerhardt's butt  cream Crea Apply 1 Application topically 2 (two) times daily. Apply to excoriated areas of groin/perineum   guaiFENesin -dextromethorphan 100-10 MG/5ML syrup Commonly known as: ROBITUSSIN DM Take 10 mLs by mouth every 4 (four) hours as needed for cough.   hydrocortisone  cream 1 % Apply 1 Application topically See admin instructions. Apply to urogenital area topically at bedtime every other day for itchy areas related to vaginitis.   ipratropium-albuterol  0.5-2.5 (3) MG/3ML Soln Commonly known as: DUONEB Inhale 3 mLs into the lungs every 6 (six) hours as needed (wheezing). And every 8 hours as needed per [facility]   ketoconazole 2 % cream Commonly known as: NIZORAL Apply 1 Application topically 2 (two) times daily. Apply to peri area   ketotifen  0.035 % ophthalmic solution Commonly known as: ZADITOR  Place 1 drop into both eyes 2 (two) times daily.   levothyroxine  100 MCG tablet Commonly known as: SYNTHROID  Take 100 mcg by mouth daily before breakfast.   loperamide 2 MG capsule Commonly known as: IMODIUM Take 2 mg by mouth every 4 (four) hours as needed for diarrhea or loose stools.   loratadine  10 MG tablet Commonly known as: CLARITIN  Take 10 mg by mouth daily.   nystatin powder Commonly known as: MYCOSTATIN/NYSTOP Apply 1 application  topically in the morning, at noon, in the evening, and at bedtime. To genital areas and inner thigh   ondansetron  4  MG tablet Commonly known as: ZOFRAN  Take 4 mg by mouth every 8 (eight) hours as needed for nausea or vomiting.   potassium chloride  10 MEQ tablet Commonly known as: KLOR-CON  M Take 30 mEq by mouth daily.   Protonix  40 MG tablet Generic drug: pantoprazole  Take 40 mg by mouth daily.   traMADol 50 MG tablet Commonly known as: ULTRAM Take 50 mg by mouth every 6 (six) hours as needed for moderate pain (pain score 4-6) or severe pain (pain score 7-10).   triamcinolone cream 0.5 % Commonly known as:  KENALOG Apply 1 Application topically at bedtime. Apply to lower back skin folds rash   triamcinolone cream 0.1 % Commonly known as: KENALOG Apply 1 Application topically 2 (two) times daily. Apply to genital area   Vitamin D3 50 MCG (2000 UT) Tabs Take 4,000 Units by mouth daily.        Review of Systems  Constitutional:  Positive for fatigue and unexpected weight change. Negative for appetite change and fever.       Weight gained about 10 pounds in the past month  HENT:  Negative for congestion, sore throat and trouble swallowing.   Eyes:  Negative for visual disturbance.  Respiratory:  Positive for cough. Negative for shortness of breath and wheezing.   Cardiovascular:  Positive for leg swelling.  Gastrointestinal:  Negative for constipation.  Genitourinary:  Positive for frequency. Negative for dysuria and urgency.  Musculoskeletal:  Positive for arthralgias, back pain and gait problem.       Mid to lower back pain, worsened, positional Lower back pain  Skin:  Negative for color change.  Neurological:  Negative for speech difficulty and weakness.  Psychiatric/Behavioral:  Negative for confusion and sleep disturbance. The patient is not nervous/anxious.     Immunization History  Administered Date(s) Administered   Fluzone Influenza virus vaccine,trivalent (IIV3), split virus 02/20/2012, 02/11/2013, 02/02/2016, 01/11/2017, 02/04/2019   INFLUENZA, HIGH DOSE SEASONAL PF 02/04/2015, 01/11/2017, 01/30/2018, 02/05/2020, 01/25/2023   Influenza Split 01/28/2008, 02/17/2009   Influenza-Unspecified 02/16/2022   Moderna Covid-19 Vaccine Bivalent Booster 30yrs & up 04/13/2023   Moderna SARS-COV2 Booster Vaccination 02/24/2022   Moderna Sars-Covid-2 Vaccination 04/29/2019, 05/09/2019, 05/27/2019, 03/03/2020, 09/04/2020, 11/04/2020   PPD Test 02/05/2021, 02/19/2021, 07/14/2022, 10/19/2022   Pfizer Covid-19 Vaccine Bivalent Booster 9yrs & up 02/09/2021   Pneumococcal Conjugate-13  08/30/2013   Pneumococcal Polysaccharide-23 11/03/2005   RSV,unspecified 04/22/2022   Td 08/01/2001, 09/05/2019   Td (Adult) 08/01/2001   Tdap 09/22/2010, 11/24/2020   Zoster Recombinant(Shingrix) 11/08/2016, 01/11/2017   Zoster, Live 11/04/2005, 11/08/2016, 01/11/2017   Zoster, Unspecified 01/11/2017   Pertinent  Health Maintenance Due  Topic Date Due   Influenza Vaccine  11/24/2023   DEXA SCAN  Completed      04/15/2022   10:15 AM 04/27/2022   11:35 AM 05/31/2022    9:34 AM 07/26/2022    9:31 AM 10/21/2022    9:31 AM  Fall Risk  Falls in the past year? 0 0 0 0 0  Was there an injury with Fall? 0 0 0 0 0  Fall Risk Category Calculator 0 0 0 0 0  Fall Risk Category (Retired) Low  Low      (RETIRED) Patient Fall Risk Level Moderate fall risk  Moderate fall risk      Patient at Risk for Falls Due to History of fall(s) History of fall(s) History of fall(s)  No Fall Risks  Fall risk Follow up Falls evaluation completed  Falls evaluation completed  Falls evaluation completed  Falls evaluation completed     Data saved with a previous flowsheet row definition   Functional Status Survey:    Vitals:   12/29/23 1217  BP: (!) 110/59  Pulse: 61  Resp: 20  Temp: (!) 97.3 F (36.3 C)  SpO2: 97%  Weight: (!) 303 lb (137.4 kg)   Body mass index is 46.07 kg/m. Physical Exam Vitals and nursing note reviewed.  Constitutional:      Appearance: Normal appearance. She is obese.  HENT:     Head: Normocephalic and atraumatic.     Nose: Nose normal.     Mouth/Throat:     Mouth: Mucous membranes are moist.  Eyes:     Extraocular Movements: Extraocular movements intact.     Conjunctiva/sclera: Conjunctivae normal.     Pupils: Pupils are equal, round, and reactive to light.  Cardiovascular:     Rate and Rhythm: Normal rate and regular rhythm.     Heart sounds: No murmur heard. Pulmonary:     Effort: Pulmonary effort is normal.     Breath sounds: Wheezing present. No rales.  Abdominal:      General: Bowel sounds are normal.     Palpations: Abdomen is soft.     Tenderness: There is no abdominal tenderness.  Musculoskeletal:        General: Tenderness present.     Cervical back: Normal range of motion and neck supple.     Right lower leg: Edema present.     Left lower leg: Edema present.     Comments: R hip, prosthetic hip removed. Dependent edema moderate. Lower back pain is flared up, no spine no spinous process tenderness palpated, pain is positional  Skin:    General: Skin is warm and dry.     Findings: Erythema present.     Comments: Right hip surgical scar.  Edema mostly in the dependent areas, buttock, thighs, legs.  More erythema, swelling, mild warmth in the right hip/lateral thigh area, no tenderness palpated as well as lumbar spinal processes.   Neurological:     General: No focal deficit present.     Mental Status: She is alert and oriented to person, place, and time.     Gait: Gait abnormal.  Psychiatric:        Mood and Affect: Mood normal.        Behavior: Behavior normal.        Thought Content: Thought content normal.        Judgment: Judgment normal.     Labs reviewed: Recent Labs    12/15/23 0507 12/16/23 0650 12/17/23 0532  NA 139 137 139  K 3.3* 3.6 3.6  CL 113* 112* 110  CO2 17* 15* 18*  GLUCOSE 73 70 63*  BUN 24* 25* 24*  CREATININE 2.24* 2.45* 2.34*  CALCIUM 8.7* 8.5* 8.5*  MG  --  2.1 2.0   Recent Labs    12/10/23 1822 12/16/23 0650 12/17/23 0532  AST 14* 11* 10*  ALT 15 11 10   ALKPHOS 111 82 76  BILITOT 0.5 0.5 0.4  PROT 7.1 6.2* 5.8*  ALBUMIN 3.5 3.0* 2.7*   Recent Labs    12/10/23 1822 12/11/23 0442 12/15/23 0507 12/16/23 0650 12/17/23 0532  WBC 5.4   < > 9.4 8.3 9.4  NEUTROABS 3.9  --   --  5.3 6.3  HGB 10.6*   < > 10.6* 10.9* 9.9*  HCT 35.7*   < > 36.6 37.6 32.8*  MCV  92.5   < > 92.7 94.0 92.4  PLT 142*   < > 171 183 198   < > = values in this interval not displayed.   Lab Results  Component Value  Date   TSH 5.752 (H) 12/16/2023   No results found for: HGBA1C No results found for: CHOL, HDL, LDLCALC, LDLDIRECT, TRIG, CHOLHDL  Significant Diagnostic Results in last 30 days:  DG Chest 1 View Result Date: 12/16/2023 CLINICAL DATA:  Pneumonia. EXAM: CHEST  1 VIEW COMPARISON:  12/10/2023. FINDINGS: The heart is enlarged the mediastinal contour is stable. There is atherosclerotic calcification of the aorta. The pulmonary vasculature is mildly distended. No consolidation, effusion, or pneumothorax is seen. No acute osseous abnormality. IMPRESSION: 1. No focal infiltrates are identified. 2. Cardiomegaly with distended pulmonary vasculature. Electronically Signed   By: Leita Birmingham M.D.   On: 12/16/2023 14:13   CT CHEST ABDOMEN PELVIS WO CONTRAST Result Date: 12/10/2023 CLINICAL DATA:  Sepsis possible UTI EXAM: CT CHEST, ABDOMEN AND PELVIS WITHOUT CONTRAST TECHNIQUE: Multidetector CT imaging of the chest, abdomen and pelvis was performed following the standard protocol without IV contrast. RADIATION DOSE REDUCTION: This exam was performed according to the departmental dose-optimization program which includes automated exposure control, adjustment of the mA and/or kV according to patient size and/or use of iterative reconstruction technique. COMPARISON:  Chest [name]-ray 12/10/2023, CT 01/10/2023, chest CT 07/02/2022, hip CT 07/11/2022, 06/08/2021 FINDINGS: CT CHEST FINDINGS Cardiovascular: Limited without intravenous contrast. Mild aortic atherosclerosis. No aneurysm. Multi vessel coronary vascular calcification. Cardiomegaly. No pericardial effusion. Mediastinum/Nodes: Patent trachea no thyroid  mass. No suspicious lymph nodes. Moderate large hiatal hernia. Lungs/Pleura: Mild diffuse bilateral mosaic density likely due to airways disease. There is diffuse bilateral bronchial wall thickening with narrowed appearance of airways. Patchy mucous plugging most evident in the lower lobes. Scattered small  foci of irregular consolidation and nodularity within the bilateral lower lobes. Bandlike scarring or atelectasis in both lower lobes. No sizable pleural effusion Musculoskeletal: Sternum appears intact. Multilevel degenerative changes. No acute osseous abnormality. CT ABDOMEN PELVIS FINDINGS Hepatobiliary: No focal liver abnormality is seen. Status post cholecystectomy. No biliary dilatation. Pancreas: Unremarkable. No pancreatic ductal dilatation or surrounding inflammatory changes. Spleen: Normal in size without focal abnormality. Adrenals/Urinary Tract: Adrenal glands are normal. Kidneys show no hydronephrosis. Foley catheter in the bladder which is decompressed. Right kidney appears slightly atrophic. Stomach/Bowel: Stomach within normal limits aside from hiatal hernia which contains probable pill fragments. No dilated small bowel. Diverticular disease of the colon. Difficult to exclude subtle fat stranding and minimal wall thickening at the descending/proximal sigmoid colon, series 2, image 95. Negative appendix. Vascular/Lymphatic: Aortic atherosclerosis. No enlarged abdominal or pelvic lymph nodes. Reproductive: Uterus and bilateral adnexa are unremarkable. Other: Negative for pelvic effusion or free air. Musculoskeletal: Chronic superior endplate deformities at L1 and L3. suspect transitional anatomy at lumbosacral junction with lumbarized S1 segment. Spinal hardware at the spinous processes of the lower lumbar spine.1 no acute osseous abnormality. Chronic deformity of right hip with evidence of prior right hip hardware removal and chronic mixed soft tissue and fluid density at the right hip socket. Chronic erosive and remodeling change of the right acetabulum. IMPRESSION: 1. Diffuse bilateral bronchial wall thickening with narrowed appearance of airways consistent with airways inflammatory process and patchy mucous plugging most evident in the lower lobes. Scattered small foci of irregular consolidation  and nodularity within the bilateral lower lobes, suspect for multifocal pneumonia or potential aspiration. 2. Diverticular disease of the colon. Difficult to exclude  subtle fat stranding and minimal wall thickening at the descending/proximal sigmoid colon, as may be seen with mild diverticulitis. 3. Other chronic findings as described above 4. Aortic atherosclerosis. Aortic Atherosclerosis (ICD10-I70.0). Electronically Signed   By: Luke Bun M.D.   On: 12/10/2023 21:42   DG Chest Port 1 View Result Date: 12/10/2023 CLINICAL DATA:  Insert epic diagnosis and indication. EXAM: PORTABLE CHEST 1 VIEW COMPARISON:  Radiograph 10/13/2022 FINDINGS: Patient is chronically rotated. Stable heart size and mediastinal contours. No focal airspace disease. No pleural effusion or pneumothorax. No acute osseous findings. Chronic shoulder arthropathy. IMPRESSION: No acute chest findings. Electronically Signed   By: Andrea Gasman M.D.   On: 12/10/2023 18:55    Assessment/Plan: CKD (chronic kidney disease) stage 4, GFR 15-29 ml/min (HCC)  followed by Nephrology, Bun/creat 48/2.27 12/26/23  MCI (mild cognitive impairment) with memory loss Resides in SNF [facility] for care needs.   Multifocal pneumonia Cough, wheezing, update CXR ap/lateral, CBC/diff  Acute on chronic diastolic CHF (congestive heart failure) (HCC) cough, wheezing, weight gained about #10Ibs in the past month, noted increased edema BLE, R>L. Increase Furosemide  to bid [name] 3 days Update CXR ap/lateral, BNP  Lower back pain C/o lower back pain, recurrent, no spinal spinous process tenderness noted. 11/2023 CT chest, abd, pelvis revealed concerns for multifocal PNA. 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 TDWB. Power wc. 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 Fully treated, then off antibiotics. 11/17/22 ID no infection, no tx, healed.  Lateral R hip/thigh, more swelling, mild warmth, erythema, no pain palpated, observe   Chronic anemia post op, s/p EGD no active bleed. S/p 6 u PRBC transfusion, ASA was dc'd, On Fe, Iron 61, Vit B12 216, Hgb 9.9 12/17/23, adding Vit B12 1000mcg every day.   Essential hypertension Blood pressure runs low, takes furosemide   GERD (gastroesophageal reflux disease) stable, on  Pantoprazole , off  Omeprazole, prn Zofran .  Hypothyroidism  TSH 5.752 12/16/23,  on Levothyroxine .     Family/ staff Communication: Plan of care reviewed with the patient and charge nurse  Labs/tests ordered: CBC/differential, CMP/eGFR, UA C/S, CXR AP/lateral

## 2023-12-29 NOTE — Assessment & Plan Note (Signed)
 C/o lower back pain, recurrent, no spinal spinous process tenderness noted. 11/2023 CT chest, abd, pelvis revealed concerns for multifocal PNA. Will schedule Tramadol in addition to prn for pain control, update UA C/S

## 2023-12-29 NOTE — Assessment & Plan Note (Signed)
 TSH 5.752 12/16/23,  on Levothyroxine .

## 2023-12-29 NOTE — Assessment & Plan Note (Signed)
 post op, s/p EGD no active bleed. S/p 6 u PRBC transfusion, ASA was dc'd, On Fe, Iron 61, Vit B12 216, Hgb 9.9 12/17/23, adding Vit B12 1000mcg every day.

## 2024-01-02 ENCOUNTER — Non-Acute Institutional Stay (SKILLED_NURSING_FACILITY): Payer: Self-pay | Admitting: Nurse Practitioner

## 2024-01-02 ENCOUNTER — Encounter: Payer: Self-pay | Admitting: Nurse Practitioner

## 2024-01-02 DIAGNOSIS — N3000 Acute cystitis without hematuria: Secondary | ICD-10-CM | POA: Diagnosis not present

## 2024-01-02 DIAGNOSIS — D649 Anemia, unspecified: Secondary | ICD-10-CM | POA: Diagnosis not present

## 2024-01-02 DIAGNOSIS — R5381 Other malaise: Secondary | ICD-10-CM | POA: Diagnosis not present

## 2024-01-02 DIAGNOSIS — N184 Chronic kidney disease, stage 4 (severe): Secondary | ICD-10-CM

## 2024-01-02 DIAGNOSIS — K2100 Gastro-esophageal reflux disease with esophagitis, without bleeding: Secondary | ICD-10-CM

## 2024-01-02 DIAGNOSIS — I10 Essential (primary) hypertension: Secondary | ICD-10-CM

## 2024-01-02 DIAGNOSIS — I5032 Chronic diastolic (congestive) heart failure: Secondary | ICD-10-CM

## 2024-01-02 DIAGNOSIS — E039 Hypothyroidism, unspecified: Secondary | ICD-10-CM

## 2024-01-02 LAB — CBC AND DIFFERENTIAL
HCT: 31 — AB (ref 36–46)
Hemoglobin: 9.6 — AB (ref 12.0–16.0)
Neutrophils Absolute: 8038
Platelets: 241 K/uL (ref 150–400)
WBC: 10.2

## 2024-01-02 LAB — BASIC METABOLIC PANEL WITH GFR
BUN: 38 — AB (ref 4–21)
CO2: 19 (ref 13–22)
Chloride: 108 (ref 99–108)
Creatinine: 2.3 — AB (ref 0.5–1.1)
Glucose: 67
Potassium: 4.5 meq/L (ref 3.5–5.1)
Sodium: 135 — AB (ref 137–147)

## 2024-01-02 LAB — HEPATIC FUNCTION PANEL
ALT: 8 U/L (ref 7–35)
AST: 10 — AB (ref 13–35)
Alkaline Phosphatase: 105 (ref 25–125)
Bilirubin, Total: 0.3

## 2024-01-02 LAB — COMPREHENSIVE METABOLIC PANEL WITH GFR
Albumin: 2.6 — AB (ref 3.5–5.0)
Calcium: 8.4 — AB (ref 8.7–10.7)
Globulin: 2.6
eGFR: 21

## 2024-01-02 LAB — CBC: RBC: 3.5 — AB (ref 3.87–5.11)

## 2024-01-02 NOTE — Progress Notes (Signed)
 Location:   SNF [facility] Room Number: 56 [id number] Place of Service:  SNF (31) Provider: Larwance Gao Mitnick NP  Taysha Majewski X, NP  Patient Care Team: Lily Kernen X, NP as PCP - General (Internal Medicine) [name], MD as PCP - Cardiology (Cardiology) [name], MD as PCP - Sleep Medicine (Cardiology) [name], MD (Sports Medicine) [name], MD (Ophthalmology) [name], [name], MD (Dermatology) [name], MD (Gastroenterology) [name], [name], MD (Nephrology) [name], DC as Referring Physician (Chiropractic Medicine)  Extended Emergency Contact Information Primary Emergency Contact: Holleman,[name] Address: [address]          [place], [place] [address] [place] Home Phone: [phone] Mobile Phone: [phone] Relation: Spouse  Code Status: DNR Goals of care: Advanced Directive information    12/18/2023    1:15 PM  Advanced Directives  Does Patient Have a Medical Advance Directive? Yes  Type of Advance Directive Living will;Out of facility DNR (pink MOST or yellow form)  Does patient want to make changes to medical advance directive? No - Patient declined     Chief Complaint  Patient presents with   Urinary Tract Infection    UTI deconditioning    HPI:  Pt is a 83 y.o. female seen today for an acute visit for UTI and deconditioning.   12/29/23 wbc 13.4, Hgb 11.1, plt 239, neutrophils 81.8, Na 136, K 4.6, Bun 42, creat 2.07, CXR no infiltrate, effusion, or acute findings.  01/01/24 Fosfomycin 3gm x1, urine culture showed K. Aerogenes, E. Cloacae, susceptible antibiotics: Septra and Gentamicin, not favorable for tx due to possibility of nephrotoxicity   01/01/24 Deconditioning, pt/husband-HPOA desires MOST comfort measures, no ABT/IVF/hospitalization/feeding tube, desires Hospice service.   01/02/24 wbc 10.2, Hgb 9.6, plt 241, neutrophils 78.8, Na 135, K 4.5, Bun 38, creat 2.27, BNP 167  01/02/24  Hospitalized 12/10/2023 to 12/17/2023 for multifocal pneumonia, acute metabolic encephalopathy, CT chest, abd, pelvis revealed concerns for multifocal PNA. Residual diffused erythema 2/2 a drug reaction, no certain of the culprit medication.               lower back pain, prn Tramadol and Tylenol  available to her, X-ray lumbar spine, sacrococcyx 06/08/23 negative fx.              Hospitalized 01/10/23-01/12/23 for UTI, diverticulitis, treated with Zosyn , Cefadroxil , Flagyl , developed pruritic rash, then ID changed to Cipro  and Flagyl -complete 01/19/23, last colonoscopy 2018. Has x1 Fosfomycin.              Hospitalized for AKI 10/13/22-10/19/22 for AKI             Hospitalized 07/08/22 for CHF, abscess of the right hip which previous infected, treated, and prosthesis was removed and never replaced.               Hospitalized 07/02/22-07/07/22 for the right hip cellulitis, CT hip in hospital fluid collection unclear if it communicates with the joint space, Ortho not felt to be related to the joint space and may be an old fluid collection, aspiration not recommended. 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 [facility]. Power wc. 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 Fully treated, then off antibiotics. 11/17/22 ID no infection, no tx, healed.               Elevated AST/ALT normalized, S/p cholecystectomy, US  02/19/21 no cyst or mass.              CKD stage 4, followed by Nephrology, Bun/creat 38/2.27 01/02/24             Anemia, post op, s/p EGD no active bleed. S/p 6 u PRBC transfusion, ASA was dc'd, On Fe, Iron 61, Vit B12 216, Hgb 9.6 01/02/24, adding Vit B12 1000mcg every day.              Muscle spasm, left lower back, resolved, off Gabapentin , failed Methocarbamol              HTN on  Furosemide , off Spironolactone .              Morbid obesity             OSA CPAP             Chronic diarrhea, prn Imodium, Align             RA f/u Rheumatology, hx of Plaquenil use, stable presently.              Insomnia/depression/anxiety, stable, off Mirtazapine .              GERD, stable, on  Pantoprazole , off  Omeprazole, prn Zofran .             CHF/Edema, taking Furosemide , BNP 167 01/02/24 Hyponatremia, normalized             Hypothyroidism, TSH 5.752 12/16/23,  on Levothyroxine .              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                   Past Medical History:  Diagnosis Date   Benign hypertension with chronic kidney disease, stage III (HCC)    Overview:  Last Assessment & Plan:  Usually the patient is hypertensive. However today her blood pressure is 105 systolic. She has been feeling fatigued with some lightheadedness. Part of this may be related to her lower blood pressure. Her pressure may be improved with her decrease in salt and fluid intake. I've instructed her to put her lisinopril/hctz on hold. 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 It was possibly reentrant. She does well with diltiazem . No  change in therapy.   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   Thyroid   disease    Urinary incontinence    Past Surgical History:  Procedure Laterality Date   CATARACT EXTRACTION Left 8/11   CATARACT EXTRACTION Right 3/12   CHOLECYSTECTOMY  1994   Copsilotomy Laser Treatment Left 6/12   eye   ELBOW SURGERY  8/12   EYE SURGERY Left 09/2008   macular hole   EYE SURGERY Right 12/11   Lumbar Infusion  2011   MOLE REMOVAL  06/17/2015   TOTAL KNEE ARTHROPLASTY Left 6/11   TOTAL KNEE ARTHROPLASTY Right 06/13/2011    Allergies  Allergen Reactions   Ciprofloxacin  Other (See Comments)    Unknown    Keflex [Cephalexin] Diarrhea   Macrobid [Nitrofurantoin] Hives   Amoxicillin Rash    Has since tolerated Zosyn  inpatient   Cefadroxil  Rash    Allergy not listed on [facility]     Allergies as of 01/02/2024       Reactions   Ciprofloxacin  Other (See Comments)   Unknown    Keflex [cephalexin] Diarrhea   Macrobid [nitrofurantoin] Hives   Amoxicillin Rash   Has since tolerated Zosyn  inpatient   Cefadroxil  Rash   Allergy not listed on [facility]         Medication List        Accurate as of January 02, 2024 11:59 PM. If you have any questions, ask your nurse or doctor.          STOP taking these medications    artificial tears ophthalmic solution Stopped by: Isair Inabinet X Myisha Pickerel   Gerhardt's butt cream Crea Stopped by: Geovanna Simko X Jearl Soto       TAKE these medications    acetaminophen  325 MG tablet Commonly known as: TYLENOL  Take 650 mg by mouth every 4 (four) hours as needed for fever, moderate pain (pain score 4-6) or mild pain (pain score 1-3).   albuterol  108 (90 Base) MCG/ACT inhaler Commonly known as: VENTOLIN  HFA Inhale 2 puffs into the lungs every 6 (six) hours as needed for wheezing or shortness of breath.   bifidobacterium infantis capsule Take 1 capsule by mouth daily.   CALCIUM PLUS VITAMIN D3 PO Take 1 tablet by  mouth daily. Vitamin D3 20mcg + Calcium 600   Cranberry 500 MG Tabs Take 500 mg by mouth in the morning and at bedtime.   cyanocobalamin 1000 MCG tablet Take 1,000 mcg by mouth daily.   docusate sodium  100 MG capsule Commonly known as: COLACE Take 100 mg by mouth daily as needed for mild constipation or moderate constipation.   ferrous sulfate  325 (65 FE) MG EC tablet Take 325 mg by mouth daily.   FOSFOMYCIN TROMETHAMINE  PO Take 3 mg by mouth as directed. Give 1 packet by mouth one time only related to URINARY TRACT INFECTION, SITE NOT SPECIFIED (N39.0) for 1 Day   furosemide  20 MG tablet Commonly known as: LASIX  Take 20 mg by mouth daily.   guaiFENesin -dextromethorphan 100-10 MG/5ML syrup Commonly known as: ROBITUSSIN DM Take 10 mLs by mouth every 4 (four) hours as needed for cough.   hydrocortisone  cream 1 % Apply 1 Application topically See admin instructions. Apply to urogenital area topically at bedtime every other day for itchy areas related to vaginitis.   ipratropium-albuterol  0.5-2.5 (3) MG/3ML Soln Commonly known as: DUONEB Inhale 3 mLs into the lungs every 6 (six) hours as needed (wheezing). And every 8 hours as needed per [facility]   ketoconazole 2 % cream Commonly known as: NIZORAL Apply 1 Application topically 2 (two) times daily.  Apply to peri area   ketotifen  0.035 % ophthalmic solution Commonly known as: ZADITOR  Place 1 drop into both eyes 2 (two) times daily.   levothyroxine  100 MCG tablet Commonly known as: SYNTHROID  Take 100 mcg by mouth daily before breakfast.   loperamide 2 MG capsule Commonly known as: IMODIUM Take 2 mg by mouth every 4 (four) hours as needed for diarrhea or loose stools.   loratadine  10 MG tablet Commonly known as: CLARITIN  Take 10 mg by mouth daily.   nystatin powder Commonly known as: MYCOSTATIN/NYSTOP Apply 1 application  topically in the morning, at noon, in the evening, and at bedtime. To genital  areas and inner thigh   ondansetron  4 MG tablet Commonly known as: ZOFRAN  Take 4 mg by mouth every 8 (eight) hours as needed for nausea or vomiting.   potassium chloride  10 MEQ tablet Commonly known as: KLOR-CON  M Take 30 mEq by mouth daily.   Protonix  40 MG tablet Generic drug: pantoprazole  Take 40 mg by mouth daily.   traMADol 50 MG tablet Commonly known as: ULTRAM Take 50 mg by mouth every 6 (six) hours as needed for moderate pain (pain score 4-6) or severe pain (pain score 7-10).   triamcinolone cream 0.5 % Commonly known as: KENALOG Apply 1 Application topically at bedtime. Apply to lower back skin folds rash   triamcinolone cream 0.1 % Commonly known as: KENALOG Apply 1 Application topically 2 (two) times daily. Apply to genital area   Vitamin D3 50 MCG (2000 UT) Tabs Take 4,000 Units by mouth daily.        Review of Systems  Constitutional:  Positive for fatigue. Negative for appetite change and fever.  HENT:  Negative for congestion, sore throat and trouble swallowing.   Eyes:  Negative for visual disturbance.  Respiratory:  Positive for cough. Negative for shortness of breath and wheezing.   Cardiovascular:  Positive for leg swelling.  Gastrointestinal:  Negative for constipation.  Genitourinary:  Positive for frequency. Negative for dysuria and urgency.  Musculoskeletal:  Positive for arthralgias, back pain and gait problem.       Mid to lower back pain, worsened, positional Lower back pain  Skin:  Negative for color change.  Neurological:  Negative for speech difficulty and weakness.  Psychiatric/Behavioral:  Negative for confusion and sleep disturbance. The patient is not nervous/anxious.     Immunization History  Administered Date(s) Administered   Fluzone Influenza virus vaccine,trivalent (IIV3), split virus 02/20/2012, 02/11/2013, 02/02/2016, 01/11/2017, 02/04/2019   INFLUENZA, HIGH DOSE SEASONAL PF 02/04/2015, 01/11/2017, 01/30/2018, 02/05/2020,  01/25/2023   Influenza Split 01/28/2008, 02/17/2009   Influenza-Unspecified 02/16/2022   Moderna Covid-19 Vaccine Bivalent Booster 74yrs & up 04/13/2023   Moderna SARS-COV2 Booster Vaccination 02/24/2022   Moderna Sars-Covid-2 Vaccination 04/29/2019, 05/09/2019, 05/27/2019, 03/03/2020, 09/04/2020, 11/04/2020   PPD Test 02/05/2021, 02/19/2021, 07/14/2022, 10/19/2022   Pfizer Covid-19 Vaccine Bivalent Booster 28yrs & up 02/09/2021   Pneumococcal Conjugate-13 08/30/2013   Pneumococcal Polysaccharide-23 11/03/2005   RSV,unspecified 04/22/2022   Td 08/01/2001, 09/05/2019   Td (Adult) 08/01/2001   Tdap 09/22/2010, 11/24/2020   Zoster Recombinant(Shingrix) 11/08/2016, 01/11/2017   Zoster, Live 11/04/2005, 11/08/2016, 01/11/2017   Zoster, Unspecified 01/11/2017   Pertinent  Health Maintenance Due  Topic Date Due   Influenza Vaccine  11/24/2023   DEXA SCAN  Completed      04/15/2022   10:15 AM 04/27/2022   11:35 AM 05/31/2022    9:34 AM 07/26/2022    9:31 AM 10/21/2022    9:31  AM  Fall Risk  Falls in the past year? 0 0 0 0 0  Was there an injury with Fall? 0 0 0 0 0  Fall Risk Category Calculator 0 0 0 0 0  Fall Risk Category (Retired) Low  Low      (RETIRED) Patient Fall Risk Level Moderate fall risk  Moderate fall risk      Patient at Risk for Falls Due to History of fall(s) History of fall(s) History of fall(s)  No Fall Risks  Fall risk Follow up Falls evaluation completed  Falls evaluation completed  Falls evaluation completed  Falls evaluation completed     Data saved with a previous flowsheet row definition   Functional Status Survey:    Vitals:   01/02/24 1446  BP: 112/62  Pulse: 62  Resp: 20  Temp: (!) 97.3 F (36.3 C)  SpO2: 97%  Weight: (!) 303 lb 6.4 oz (137.6 kg)  Height: 5' 8 (1.727 m)   Body mass index is 46.13 kg/m. Physical Exam Vitals and nursing note reviewed.  Constitutional:      Appearance: Normal appearance. She is obese.  HENT:     Head:  Normocephalic and atraumatic.     Nose: Nose normal.     Mouth/Throat:     Mouth: Mucous membranes are moist.  Eyes:     Extraocular Movements: Extraocular movements intact.     Conjunctiva/sclera: Conjunctivae normal.     Pupils: Pupils are equal, round, and reactive to light.  Cardiovascular:     Rate and Rhythm: Normal rate and regular rhythm.     Heart sounds: No murmur heard. Pulmonary:     Effort: Pulmonary effort is normal.     Breath sounds: No wheezing or rales.  Abdominal:     General: Bowel sounds are normal.     Palpations: Abdomen is soft.     Tenderness: There is no abdominal tenderness. There is no right CVA tenderness, left CVA tenderness, guarding or rebound.  Musculoskeletal:        General: Tenderness present.     Cervical back: Normal range of motion and neck supple.     Right lower leg: Edema present.     Left lower leg: Edema present.     Comments: R hip, prosthetic hip removed. Dependent edema moderate. Lower back pain is flared up, no spine no spinous process tenderness palpated, pain is positional  Skin:    General: Skin is warm and dry.     Findings: Erythema present.     Comments: Right hip surgical scar.  Edema mostly in the dependent areas, buttock, thighs, legs.  More erythema, swelling, mild warmth in the right hip/lateral thigh area, no tenderness palpated as well as lumbar spinal processes.   Neurological:     General: No focal deficit present.     Mental Status: She is alert and oriented to person, place, and time.     Gait: Gait abnormal.  Psychiatric:        Mood and Affect: Mood normal.        Behavior: Behavior normal.        Thought Content: Thought content normal.        Judgment: Judgment normal.     Labs reviewed: Recent Labs    12/15/23 0507 12/16/23 0650 12/17/23 0532 12/26/23 0000 12/29/23 0000 01/02/24 0000  NA 139 137 139 140 136* 135*  K 3.3* 3.6 3.6 4.7 4.6 4.5  CL 113* 112* 110 114* 109* 108  CO2 17* 15* 18* 18 15  19   GLUCOSE 73 70 63*  --   --   --   BUN 24* 25* 24* 48* 42* 38*  CREATININE 2.24* 2.45* 2.34* 2.3* 2.1* 2.3*  CALCIUM 8.7* 8.5* 8.5* 8.6* 8.7 8.4*  MG  --  2.1 2.0  --   --   --    Recent Labs    12/10/23 1822 12/16/23 0650 12/17/23 0532 12/29/23 0000 01/02/24 0000  AST 14* 11* 10* 13 10*  ALT 15 11 10 9 8   ALKPHOS 111 82 76 119 105  BILITOT 0.5 0.5 0.4  --   --   PROT 7.1 6.2* 5.8*  --   --   ALBUMIN 3.5 3.0* 2.7* 3.4* 2.6*   Recent Labs    12/15/23 0507 12/16/23 0650 12/17/23 0532 12/29/23 0000 01/02/24 0000  WBC 9.4 8.3 9.4 13.4 10.2  NEUTROABS  --  5.3 6.3  --  8,038.00  HGB 10.6* 10.9* 9.9* 11.1* 9.6*  HCT 36.6 37.6 32.8* 35* 31*  MCV 92.7 94.0 92.4  --   --   PLT 171 183 198 239 241   Lab Results  Component Value Date   TSH 5.752 (H) 12/16/2023   No results found for: HGBA1C No results found for: CHOL, HDL, LDLCALC, LDLDIRECT, TRIG, CHOLHDL  Significant Diagnostic Results in last 30 days:  No results found.   Assessment/Plan: Acute cystitis without hematuria 12/29/23 wbc 13.4, Hgb 11.1, plt 239, neutrophils 81.8, Na 136, K 4.6, Bun 42, creat 2.07, CXR no infiltrate, effusion, or acute findings. 01/01/24 Fosfomycin 3gm x1, urine culture showed K. Aerogenes, E. Cloacae, susceptible antibiotics: Septra and Gentamicin, not favorable for tx due to possibility of nephrotoxicity   Physical deconditioning 01/01/24 Deconditioning, pt/husband-HPOA desires MOST comfort measures, no ABT/IVF/hospitalization/feeding tube, desires Hospice service.   CKD (chronic kidney disease) stage 4, GFR 15-29 ml/min (HCC) 01/02/24 wbc 10.2, Hgb 9.6, plt 241, neutrophils 78.8, Na 135, K 4.5, Bun 38, creat 2.27, BNP 167 01/02/24 CKD stage 4, followed by Nephrology, Bun/creat 38/2.27 01/02/24  Chronic anemia   Anemia, post op, s/p EGD no active bleed. S/p 6 u PRBC transfusion, ASA was dc'd, On Fe, Iron 61, Vit B12 216, Hgb 9.6 01/02/24, adding Vit B12 1000mcg every day.    Essential hypertension Blood pressure is controlled,  on Furosemide , off Spironolactone .   GERD (gastroesophageal reflux disease)  stable, on  Pantoprazole , off  Omeprazole, prn Zofran .  Chronic heart failure with preserved ejection fraction (HFpEF) (HCC) taking Furosemide , BNP 167 01/02/24  Hypothyroidism TSH 5.752 12/16/23,  on Levothyroxine .     Family/ staff Communication: Plan of care reviewed with the patient, the patient's husband-H POA, social worker, and the charge nurse  Labs/tests ordered: None

## 2024-01-02 NOTE — Assessment & Plan Note (Signed)
 Blood pressure is controlled, on Furosemide , off Spironolactone .

## 2024-01-02 NOTE — Assessment & Plan Note (Signed)
 stable, on  Pantoprazole, off  Omeprazole, prn Zofran.

## 2024-01-02 NOTE — Assessment & Plan Note (Signed)
 12/29/23 wbc 13.4, Hgb 11.1, plt 239, neutrophils 81.8, Na 136, K 4.6, Bun 42, creat 2.07, CXR no infiltrate, effusion, or acute findings. 01/01/24 Fosfomycin 3gm x1, urine culture showed K. Aerogenes, E. Cloacae, susceptible antibiotics: Septra and Gentamicin, not favorable for tx due to possibility of nephrotoxicity

## 2024-01-02 NOTE — Assessment & Plan Note (Signed)
 Anemia, post op, s/p EGD no active bleed. S/p 6 u PRBC transfusion, ASA was dc'd, On Fe, Iron 61, Vit B12 216, Hgb 9.6 01/02/24, adding Vit B12 1000mcg every day.

## 2024-01-02 NOTE — Assessment & Plan Note (Signed)
 TSH 5.752 12/16/23,  on Levothyroxine .

## 2024-01-02 NOTE — Progress Notes (Signed)
 Location:  [facility] Room Number: 13 A Place of Service:  SNF (31) Provider:  Cheick Suhr X, NP   Jaylun Fleener X, NP  Patient Care Team: Justyna Timoney X, NP as PCP - General (Internal Medicine) [name], MD as PCP - Cardiology (Cardiology) [name], MD as PCP - Sleep Medicine (Cardiology) [name], MD (Sports Medicine) [name], MD (Ophthalmology) [name], [name], MD (Dermatology) [name], MD (Gastroenterology) [name], [name], MD (Nephrology) [name], DC as Referring Physician (Chiropractic Medicine)  Extended Emergency Contact Information Primary Emergency Contact: Wartman,[name] Address: [address]          [place], [place] [address] [place] Home Phone: [phone] Mobile Phone: [phone] Relation: Spouse  Code Status:  DNR Goals of care: Advanced Directive information    12/18/2023    1:15 PM  Advanced Directives  Does Patient Have a Medical Advance Directive? Yes  Type of Advance Directive Living will;Out of facility DNR (pink MOST or yellow form)  Does patient want to make changes to medical advance directive? No - Patient declined     Chief Complaint  Patient presents with   Urinary Tract Infection    UTI deconditioning    HPI:  Pt is a 83 y.o. female seen today for an acute visit for    Past Medical History:  Diagnosis Date   Benign hypertension with chronic kidney disease, stage III (HCC)    Overview:  Last Assessment & Plan:  Usually the patient is hypertensive. However today her blood pressure is 105 systolic. She has been feeling fatigued with some lightheadedness. Part of this may be related to her lower blood pressure. Her pressure may be improved with her decrease in salt and fluid intake. I've instructed her to put her lisinopril/hctz on hold. 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 It was possibly reentrant. She does well with diltiazem . No change in therapy.   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   albuterol  (VENTOLIN  HFA) 108 (90 Base) MCG/ACT inhaler  Inhale 2 puffs into the lungs every 6 (six) hours as needed for wheezing or shortness of breath.   bifidobacterium infantis (ALIGN) capsule Take 1 capsule by mouth daily.   Calcium Carb-Cholecalciferol  (CALCIUM PLUS VITAMIN D3 PO) Take 1 tablet by mouth daily. Vitamin D3 20mcg + Calcium 600   Cholecalciferol  (VITAMIN D3) 50 MCG (2000 UT) TABS Take 4,000 Units by mouth daily.   Cranberry 500 MG TABS Take 500 mg by mouth in the morning and at bedtime.   cyanocobalamin 1000 MCG tablet Take 1,000 mcg by mouth daily.   docusate sodium  (COLACE) 100 MG capsule Take 100 mg by mouth daily as needed for mild constipation or moderate constipation.   ferrous sulfate  325 (65 FE) MG EC tablet Take 325 mg by mouth daily.   FOSFOMYCIN TROMETHAMINE  PO Take 3 mg by mouth as directed. Give 1 packet by mouth one time only related to URINARY TRACT INFECTION, SITE NOT SPECIFIED (N39.0) for 1 Day   furosemide  (LASIX ) 20 MG tablet Take 20 mg by mouth daily.   guaiFENesin -dextromethorphan (ROBITUSSIN DM) 100-10 MG/5ML syrup Take 10 mLs by mouth every 4 (four) hours as needed for cough.   hydrocortisone  cream 1 % Apply 1 Application topically See admin instructions. Apply to urogenital area topically at bedtime every other day for itchy areas related to vaginitis.   ipratropium-albuterol  (DUONEB) 0.5-2.5 (3) MG/3ML SOLN Inhale 3 mLs into the lungs every 6 (six) hours as needed (wheezing). And every 8 hours as needed per [facility]   ketoconazole (NIZORAL) 2 % cream Apply 1 Application topically 2 (two) times daily. Apply to peri area   ketotifen  (ZADITOR ) 0.035 % ophthalmic solution Place 1 drop into both eyes 2 (two) times daily.   levothyroxine  (SYNTHROID ) 100 MCG tablet Take 100 mcg by mouth daily before breakfast.   loperamide (IMODIUM) 2 MG capsule Take 2 mg by mouth every 4 (four) hours as needed for diarrhea or loose stools.   loratadine  (CLARITIN ) 10 MG tablet Take 10 mg by mouth daily.   nystatin  (MYCOSTATIN/NYSTOP) powder Apply 1 application  topically in the morning, at noon, in the evening, and at bedtime. To genital areas and inner thigh   ondansetron  (ZOFRAN ) 4 MG tablet Take 4 mg by mouth every 8 (eight) hours as needed for nausea or vomiting.   pantoprazole  (PROTONIX ) 40 MG tablet Take 40 mg by mouth daily.   potassium chloride  (KLOR-CON  M) 10 MEQ tablet Take 30 mEq by mouth daily.   traMADol (ULTRAM) 50 MG tablet Take 50 mg by mouth every 6 (six) hours as needed for moderate pain (pain score 4-6) or severe pain (pain score 7-10).   triamcinolone cream (KENALOG) 0.1 % Apply 1 Application topically 2 (two) times daily. Apply to genital area   triamcinolone cream (KENALOG) 0.5 % Apply 1 Application topically at bedtime. Apply to lower back skin folds rash   [DISCONTINUED]  artificial tears ophthalmic solution Place 2 drops into both eyes as needed for dry eyes. (Patient not taking: Reported on 01/02/2024)   [DISCONTINUED] Nystatin (GERHARDT'S BUTT CREAM) CREA Apply 1 Application topically 2 (two) times daily. Apply to excoriated areas of groin/perineum (Patient not taking: Reported on 01/02/2024)   No facility-administered encounter medications on file as of 01/02/2024.    Review of Systems  Immunization History  Administered Date(s) Administered   Fluzone Influenza virus vaccine,trivalent (IIV3), split virus 02/20/2012, 02/11/2013, 02/02/2016, 01/11/2017, 02/04/2019   INFLUENZA, HIGH DOSE SEASONAL PF 02/04/2015, 01/11/2017, 01/30/2018, 02/05/2020, 01/25/2023   Influenza Split 01/28/2008, 02/17/2009   Influenza-Unspecified 02/16/2022   Moderna Covid-19 Vaccine Bivalent Booster 38yrs & up 04/13/2023   Moderna SARS-COV2 Booster Vaccination 02/24/2022   Moderna Sars-Covid-2 Vaccination 04/29/2019, 05/09/2019, 05/27/2019, 03/03/2020, 09/04/2020, 11/04/2020   PPD Test 02/05/2021, 02/19/2021, 07/14/2022, 10/19/2022   Pfizer Covid-19 Vaccine Bivalent Booster 87yrs & up 02/09/2021    Pneumococcal Conjugate-13 08/30/2013   Pneumococcal Polysaccharide-23 11/03/2005   RSV,unspecified 04/22/2022   Td 08/01/2001, 09/05/2019   Td (Adult) 08/01/2001   Tdap 09/22/2010, 11/24/2020   Zoster Recombinant(Shingrix) 11/08/2016, 01/11/2017   Zoster, Live 11/04/2005, 11/08/2016, 01/11/2017   Zoster, Unspecified 01/11/2017   Pertinent  Health Maintenance Due  Topic Date Due   Influenza Vaccine  11/24/2023   DEXA SCAN  Completed      04/15/2022   10:15 AM 04/27/2022   11:35 AM 05/31/2022    9:34 AM 07/26/2022    9:31 AM 10/21/2022    9:31 AM  Fall Risk  Falls in the past year? 0 0 0 0 0  Was there an injury with Fall? 0 0 0 0 0  Fall Risk Category Calculator 0 0 0 0 0  Fall Risk Category (Retired) Low  Low      (RETIRED) Patient Fall Risk Level Moderate fall risk  Moderate fall risk      Patient at Risk for Falls Due to History of fall(s) History of fall(s) History of fall(s)  No Fall Risks  Fall risk Follow up Falls evaluation completed  Falls evaluation completed  Falls evaluation completed  Falls evaluation completed     Data saved with a previous flowsheet row definition   Functional Status Survey:    Vitals:   01/02/24 1446  BP: 112/62  Pulse: 62  Resp: 20  Temp: (!) 97.3 F (36.3 C)  SpO2: 97%  Weight: (!) 303 lb 6.4 oz (137.6 kg)  Height: 5' 8 (1.727 m)   Body mass index is 46.13 kg/m.

## 2024-01-02 NOTE — Assessment & Plan Note (Signed)
 01/01/24 Deconditioning, pt/husband-HPOA desires MOST comfort measures, no ABT/IVF/hospitalization/feeding tube, desires Hospice service.

## 2024-01-02 NOTE — Assessment & Plan Note (Signed)
 taking Furosemide , BNP 167 01/02/24

## 2024-01-02 NOTE — Assessment & Plan Note (Signed)
 01/02/24 wbc 10.2, Hgb 9.6, plt 241, neutrophils 78.8, Na 135, K 4.5, Bun 38, creat 2.27, BNP 167 01/02/24 CKD stage 4, followed by Nephrology, Bun/creat 38/2.27 01/02/24

## 2024-01-17 ENCOUNTER — Non-Acute Institutional Stay (SKILLED_NURSING_FACILITY): Payer: Self-pay | Admitting: Nurse Practitioner

## 2024-01-17 ENCOUNTER — Encounter: Payer: Self-pay | Admitting: Nurse Practitioner

## 2024-01-17 DIAGNOSIS — R0789 Other chest pain: Secondary | ICD-10-CM | POA: Diagnosis not present

## 2024-01-17 DIAGNOSIS — Z89621 Acquired absence of right hip joint: Secondary | ICD-10-CM

## 2024-01-17 DIAGNOSIS — R627 Adult failure to thrive: Secondary | ICD-10-CM | POA: Insufficient documentation

## 2024-01-17 DIAGNOSIS — K219 Gastro-esophageal reflux disease without esophagitis: Secondary | ICD-10-CM | POA: Diagnosis not present

## 2024-01-17 DIAGNOSIS — I5032 Chronic diastolic (congestive) heart failure: Secondary | ICD-10-CM

## 2024-01-17 NOTE — Assessment & Plan Note (Signed)
 stable, on  Pantoprazole, off  Omeprazole, prn Zofran.

## 2024-01-17 NOTE — Assessment & Plan Note (Signed)
 complaint of center of chest pain when coughing, nonproductive, stated do not want any more antibiotics. Changed DuoNeb to every 8 hours, will have the morphine  5 mg every 6 hours as needed available to her for pain/shortness of breath

## 2024-01-17 NOTE — Progress Notes (Signed)
 Location:   SNF [facility] Room Number: 63 Place of Service:  SNF (31) Provider: Larwance Jarrette Dehner NP  Arcenio Mullaly X, NP  Patient Care Team: Aleesa Sweigert X, NP as PCP - General (Internal Medicine) [name], MD as PCP - Cardiology (Cardiology) [name], MD as PCP - Sleep Medicine (Cardiology) [name], MD (Sports Medicine) [name], MD (Ophthalmology) [name], [name], MD (Dermatology) [name], MD (Gastroenterology) [name], [name], MD (Nephrology) [name], DC as Referring Physician (Chiropractic Medicine)  Extended Emergency Contact Information Primary Emergency Contact: Kuennen,[name] Address: [address]          [place], [place] [address] [place] Home Phone: [phone] Mobile Phone: [phone] Relation: Spouse  Code Status: DNR Goals of care: Advanced Directive information    12/18/2023    1:15 PM  Advanced Directives  Does Patient Have a Medical Advance Directive? Yes  Type of Advance Directive Living will;Out of facility DNR (pink MOST or yellow form)  Does patient want to make changes to medical advance directive? No - Patient declined     Chief Complaint  Patient presents with   Acute Visit    Middle chest pain with coughing    HPI:  Pt is a 83 y.o. female seen today for an acute visit for complaint of center of chest pain when coughing, nonproductive, stated do not want any more antibiotics.    Failure to thrive, desires comfort measures               01/01/24 Deconditioning, pt/husband-HPOA desires MOST comfort measures, no ABT/IVF/hospitalization/feeding tube, desires Hospice service.                           lower back pain, prn Tramadol and Tylenol  available to her, X-ray lumbar spine, sacrococcyx 06/08/23 negative fx.                           Hospitalized 07/08/22 for CHF, abscess of the right hip which previous infected, treated, and prosthesis was removed and never  replaced.               Hospitalized 07/02/22-07/07/22 for the right hip cellulitis, CT hip in hospital fluid collection unclear if it communicates with the joint space, Ortho not felt to be related to the joint space and may be an old fluid collection, aspiration not recommended. 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 [facility]. Power wc. 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 Fully treated, then off antibiotics. 11/17/22 ID no infection, no tx, healed.                           CKD stage 4, followed by Nephrology, Bun/creat 38/2.27 01/02/24             Anemia, post op, s/p EGD no active bleed. S/p 6 u PRBC transfusion, ASA was dc'd, On Fe, Iron 61, Vit B12 216, Hgb 9.6 01/02/24, adding Vit B12 1000mcg every day.  HTN on Furosemide , off Spironolactone .              Morbid obesity             OSA CPAP             Chronic diarrhea, prn Imodium, Align                          GERD, stable, on  Pantoprazole , off  Omeprazole, prn Zofran .             CHF/Edema, taking Furosemide , BNP 167 01/02/24              Hypothyroidism, TSH 5.752 12/16/23,  off Levothyroxine  per Hospice                             Past Medical History:  Diagnosis Date   Benign hypertension with chronic kidney disease, stage III (HCC)    Overview:  Last Assessment & Plan:  Usually the patient is hypertensive. However today her blood pressure is 105 systolic. She has been feeling fatigued with some lightheadedness. Part of this may be related to her lower blood pressure. Her pressure may be improved with her decrease in salt and fluid intake. I've instructed her to put her lisinopril/hctz on hold. I've asked her to see her primary    Cardiac disease 03/10/2014   Chronic diarrhea 07/27/2015   Edema  07/27/2015   Ejection fraction 2004   Normal, echo,    Gastroesophageal reflux disease    GERD (gastroesophageal reflux disease)    Heart disease 03/10/2014   Hypertension    Left bundle branch block 09/01/2020   Obesity    Obesity (BMI 30-39.9) 09/25/2017   OSA (obstructive sleep apnea)    mild with AHI 6.75 - on CPAP   Preop cardiovascular exam 11/2010   Cardiac clearance for knee surgery    Sleep apnea 03/10/2014   Supraventricular tachycardia    Documented episode in the past, possibly reentrant tachycardia  Overview:  Overview:  Documented episode in the past, 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 It was possibly reentrant. She does well with diltiazem . No change in therapy.   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   EYE SURGERY Right 12/11   Lumbar Infusion  2011   MOLE REMOVAL  06/17/2015   TOTAL KNEE ARTHROPLASTY Left 6/11   TOTAL KNEE ARTHROPLASTY Right 06/13/2011    Allergies  Allergen Reactions   Ciprofloxacin  Other (See Comments)    Unknown    Keflex [Cephalexin] Diarrhea   Macrobid [Nitrofurantoin] Hives   Amoxicillin Rash    Has since tolerated Zosyn  inpatient   Cefadroxil  Rash    Allergy not listed on [facility]     Allergies as of 01/17/2024       Reactions   Ciprofloxacin  Other (See Comments)   Unknown    Keflex [cephalexin] Diarrhea   Macrobid [nitrofurantoin] Hives  Amoxicillin Rash   Has since tolerated Zosyn  inpatient   Cefadroxil  Rash   Allergy not listed on [facility]         Medication List         Accurate as of January 17, 2024  1:21 PM. If you have any questions, ask your nurse or doctor.          acetaminophen  325 MG tablet Commonly known as: TYLENOL  Take 650 mg by mouth every 4 (four) hours as needed for fever, moderate pain (pain score 4-6) or mild pain (pain score 1-3).   albuterol  108 (90 Base) MCG/ACT inhaler Commonly known as: VENTOLIN  HFA Inhale 2 puffs into the lungs every 6 (six) hours as needed for wheezing or shortness of breath.   bifidobacterium infantis capsule Take 1 capsule by mouth daily.   CALCIUM PLUS VITAMIN D3 PO Take 1 tablet by mouth daily. Vitamin D3 20mcg + Calcium 600   Cranberry 500 MG Tabs Take 500 mg by mouth in the morning and at bedtime.   cyanocobalamin 1000 MCG tablet Take 1,000 mcg by mouth daily.   docusate sodium  100 MG capsule Commonly known as: COLACE Take 100 mg by mouth daily as needed for mild constipation or moderate constipation.   ferrous sulfate  325 (65 FE) MG EC tablet Take 325 mg by mouth daily.   FOSFOMYCIN TROMETHAMINE  PO Take 3 mg by mouth as directed. Give 1 packet by mouth one time only related to URINARY TRACT INFECTION, SITE NOT SPECIFIED (N39.0) for 1 Day   furosemide  20 MG tablet Commonly known as: LASIX  Take 20 mg by mouth daily.   guaiFENesin -dextromethorphan 100-10 MG/5ML syrup Commonly known as: ROBITUSSIN DM Take 10 mLs by mouth every 4 (four) hours as needed for cough.   hydrocortisone  cream 1 % Apply 1 Application topically See admin instructions. Apply to urogenital area topically at bedtime every other day for itchy areas related to vaginitis.   ipratropium-albuterol  0.5-2.5 (3) MG/3ML Soln Commonly known as: DUONEB Inhale 3 mLs into the lungs every 6 (six) hours as needed (wheezing). And every 8 hours as needed per [facility]   ketoconazole 2 % cream Commonly known as: NIZORAL Apply 1 Application topically 2 (two) times daily. Apply to peri area   ketotifen   0.035 % ophthalmic solution Commonly known as: ZADITOR  Place 1 drop into both eyes 2 (two) times daily.   levothyroxine  100 MCG tablet Commonly known as: SYNTHROID  Take 100 mcg by mouth daily before breakfast.   loperamide 2 MG capsule Commonly known as: IMODIUM Take 2 mg by mouth every 4 (four) hours as needed for diarrhea or loose stools.   loratadine  10 MG tablet Commonly known as: CLARITIN  Take 10 mg by mouth daily.   nystatin powder Commonly known as: MYCOSTATIN/NYSTOP Apply 1 application  topically in the morning, at noon, in the evening, and at bedtime. To genital areas and inner thigh   ondansetron  4 MG tablet Commonly known as: ZOFRAN  Take 4 mg by mouth every 8 (eight) hours as needed for nausea or vomiting.   potassium chloride  10 MEQ tablet Commonly known as: KLOR-CON  M Take 30 mEq by mouth daily.   Protonix  40 MG tablet Generic drug: pantoprazole  Take 40 mg by mouth daily.   traMADol 50 MG tablet Commonly known as: ULTRAM Take 50 mg by mouth every 6 (six) hours as needed for moderate pain (pain score 4-6) or severe pain (pain score 7-10).   triamcinolone cream 0.5 % Commonly known as: KENALOG Apply  1 Application topically at bedtime. Apply to lower back skin folds rash   triamcinolone cream 0.1 % Commonly known as: KENALOG Apply 1 Application topically 2 (two) times daily. Apply to genital area   Vitamin D3 50 MCG (2000 UT) Tabs Take 4,000 Units by mouth daily.        Review of Systems  Constitutional:  Positive for appetite change and fatigue. Negative for fever.  HENT:  Negative for congestion, sore throat and trouble swallowing.   Eyes:  Negative for visual disturbance.  Respiratory:  Positive for cough. Negative for shortness of breath and wheezing.        C/o center of chest pain when coughing.   Cardiovascular:  Positive for leg swelling.  Gastrointestinal:  Negative for constipation.  Genitourinary:  Positive for frequency. Negative for  dysuria and urgency.  Musculoskeletal:  Positive for arthralgias, back pain and gait problem.       Mid to lower back pain, worsened, positional Lower back pain  Skin:  Positive for wound. Negative for color change.  Neurological:  Negative for speech difficulty and weakness.  Psychiatric/Behavioral:  Negative for confusion and sleep disturbance. The patient is not nervous/anxious.     Immunization History  Administered Date(s) Administered   Fluzone Influenza virus vaccine,trivalent (IIV3), split virus 02/20/2012, 02/11/2013, 02/02/2016, 01/11/2017, 02/04/2019   INFLUENZA, HIGH DOSE SEASONAL PF 02/04/2015, 01/11/2017, 01/30/2018, 02/05/2020, 01/25/2023   Influenza Split 01/28/2008, 02/17/2009   Influenza-Unspecified 02/16/2022   Moderna Covid-19 Vaccine Bivalent Booster 3yrs & up 04/13/2023   Moderna SARS-COV2 Booster Vaccination 02/24/2022   Moderna Sars-Covid-2 Vaccination 04/29/2019, 05/09/2019, 05/27/2019, 03/03/2020, 09/04/2020, 11/04/2020   PPD Test 02/05/2021, 02/19/2021, 07/14/2022, 10/19/2022   Pfizer Covid-19 Vaccine Bivalent Booster 41yrs & up 02/09/2021   Pneumococcal Conjugate-13 08/30/2013   Pneumococcal Polysaccharide-23 11/03/2005   RSV,unspecified 04/22/2022   Td 08/01/2001, 09/05/2019   Td (Adult) 08/01/2001   Tdap 09/22/2010, 11/24/2020   Zoster Recombinant(Shingrix) 11/08/2016, 01/11/2017   Zoster, Live 11/04/2005, 11/08/2016, 01/11/2017   Zoster, Unspecified 01/11/2017   Pertinent  Health Maintenance Due  Topic Date Due   Influenza Vaccine  11/24/2023   DEXA SCAN  Completed      04/15/2022   10:15 AM 04/27/2022   11:35 AM 05/31/2022    9:34 AM 07/26/2022    9:31 AM 10/21/2022    9:31 AM  Fall Risk  Falls in the past year? 0 0 0 0 0  Was there an injury with Fall? 0 0 0 0 0  Fall Risk Category Calculator 0 0 0 0 0  Fall Risk Category (Retired) Low  Low      (RETIRED) Patient Fall Risk Level Moderate fall risk  Moderate fall risk      Patient at Risk for  Falls Due to History of fall(s) History of fall(s) History of fall(s)  No Fall Risks  Fall risk Follow up Falls evaluation completed  Falls evaluation completed  Falls evaluation completed  Falls evaluation completed     Data saved with a previous flowsheet row definition   Functional Status Survey:    Vitals:   01/17/24 1257  BP: 139/66  Pulse: 79  Resp: 20  Temp: (!) 97.3 F (36.3 C)  SpO2: 97%  Weight: (!) 303 lb 6.4 oz (137.6 kg)   Body mass index is 46.13 kg/m. Physical Exam Vitals and nursing note reviewed.  Constitutional:      Appearance: She is obese.     Comments: fatigue  HENT:     Head: Normocephalic  and atraumatic.     Nose: Nose normal.  Eyes:     Extraocular Movements: Extraocular movements intact.     Conjunctiva/sclera: Conjunctivae normal.     Pupils: Pupils are equal, round, and reactive to light.  Cardiovascular:     Rate and Rhythm: Normal rate and regular rhythm.     Heart sounds: No murmur heard. Pulmonary:     Effort: Pulmonary effort is normal.     Breath sounds: No wheezing or rales.  Abdominal:     General: Bowel sounds are normal.     Palpations: Abdomen is soft.     Tenderness: There is no abdominal tenderness.  Musculoskeletal:        General: Tenderness present.     Cervical back: Normal range of motion and neck supple.     Right lower leg: Edema present.     Left lower leg: Edema present.     Comments: R hip, prosthetic hip removed. Dependent edema moderate. Lower back pain is flared up, no spine no spinous process tenderness palpated, pain is positional  Skin:    General: Skin is warm and dry.     Findings: Erythema present.     Comments: Right hip surgical scar.  Edema mostly in the dependent areas, buttock, thighs, legs.  More erythema, swelling, mild warmth in the right hip/lateral thigh area, a small open tunneling wound R hip with yellow purulent drainage.   Neurological:     General: No focal deficit present.     Mental  Status: She is alert and oriented to person, place, and time.     Gait: Gait abnormal.  Psychiatric:        Mood and Affect: Mood normal.        Behavior: Behavior normal.        Thought Content: Thought content normal.        Judgment: Judgment normal.     Labs reviewed: Recent Labs    12/15/23 0507 12/16/23 0650 12/17/23 0532 12/26/23 0000 12/29/23 0000 01/02/24 0000  NA 139 137 139 140 136* 135*  K 3.3* 3.6 3.6 4.7 4.6 4.5  CL 113* 112* 110 114* 109* 108  CO2 17* 15* 18* 18 15 19   GLUCOSE 73 70 63*  --   --   --   BUN 24* 25* 24* 48* 42* 38*  CREATININE 2.24* 2.45* 2.34* 2.3* 2.1* 2.3*  CALCIUM 8.7* 8.5* 8.5* 8.6* 8.7 8.4*  MG  --  2.1 2.0  --   --   --    Recent Labs    12/10/23 1822 12/16/23 0650 12/17/23 0532 12/29/23 0000 01/02/24 0000  AST 14* 11* 10* 13 10*  ALT 15 11 10 9 8   ALKPHOS 111 82 76 119 105  BILITOT 0.5 0.5 0.4  --   --   PROT 7.1 6.2* 5.8*  --   --   ALBUMIN 3.5 3.0* 2.7* 3.4* 2.6*   Recent Labs    12/15/23 0507 12/16/23 0650 12/17/23 0532 12/29/23 0000 01/02/24 0000  WBC 9.4 8.3 9.4 13.4 10.2  NEUTROABS  --  5.3 6.3  --  8,038.00  HGB 10.6* 10.9* 9.9* 11.1* 9.6*  HCT 36.6 37.6 32.8* 35* 31*  MCV 92.7 94.0 92.4  --   --   PLT 171 183 198 239 241   Lab Results  Component Value Date   TSH 5.752 (H) 12/16/2023   No results found for: HGBA1C No results found for: CHOL, HDL, LDLCALC, LDLDIRECT, TRIG, CHOLHDL  Significant Diagnostic Results in last 30 days:  No results found.  Assessment/Plan: Atypical chest pain  complaint of center of chest pain when coughing, nonproductive, stated do not want any more antibiotics. 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 [facility]DW[facility]. 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  Reopened healed cutaneous abscess of R hip, treating with Clindamycin with the patient's consent, packing with gauze and cover with dressing daily, erythema and warmth in the area.   GERD (gastroesophageal reflux disease)  stable, on  Pantoprazole , off  Omeprazole, prn Zofran .  Chronic heart failure with preserved ejection fraction (HFpEF) (HCC)  taking Furosemide , BNP 167 01/02/24    Family/ staff Communication: Plan of care reviewed with the patient and the charge nurse  Labs/tests ordered: None

## 2024-01-17 NOTE — Assessment & Plan Note (Signed)
 Continue comfort measures, under hospice service

## 2024-01-17 NOTE — Assessment & Plan Note (Signed)
 taking Furosemide , BNP 167 01/02/24

## 2024-01-17 NOTE — Assessment & Plan Note (Signed)
 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 TDWB. 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  Reopened healed cutaneous abscess of R hip, treating with Clindamycin with the patient's consent, packing with gauze and cover with dressing daily, erythema and warmth in the area.

## 2024-02-05 ENCOUNTER — Non-Acute Institutional Stay (SKILLED_NURSING_FACILITY): Payer: Self-pay | Admitting: Sports Medicine

## 2024-02-05 ENCOUNTER — Encounter: Payer: Self-pay | Admitting: Sports Medicine

## 2024-02-05 DIAGNOSIS — K219 Gastro-esophageal reflux disease without esophagitis: Secondary | ICD-10-CM

## 2024-02-05 DIAGNOSIS — N184 Chronic kidney disease, stage 4 (severe): Secondary | ICD-10-CM

## 2024-02-05 DIAGNOSIS — M159 Polyosteoarthritis, unspecified: Secondary | ICD-10-CM | POA: Diagnosis not present

## 2024-02-05 DIAGNOSIS — Z515 Encounter for palliative care: Secondary | ICD-10-CM

## 2024-02-05 DIAGNOSIS — I5032 Chronic diastolic (congestive) heart failure: Secondary | ICD-10-CM | POA: Diagnosis not present

## 2024-02-05 NOTE — Progress Notes (Signed)
 Location:  [facility] Room Number: [facility] [phone] Place of Service:  SNF (31) Provider:  Dr. Sherlynn  PCP: Mast, Man X, NP  Patient Care Team: Mast, Man X, NP as PCP - General (Internal Medicine) [name], MD as PCP - Cardiology (Cardiology) [name], MD as PCP - Sleep Medicine (Cardiology) [name], MD (Sports Medicine) [name], MD (Ophthalmology) [name], [name], MD (Dermatology) [name], MD (Gastroenterology) [name], [name], MD (Nephrology) [name], DC as Referring Physician (Chiropractic Medicine)  Extended Emergency Contact Information Primary Emergency Contact: Nahm,[name] Address: [address]          [place], [place] [address] [place] Home Phone: [phone] Mobile Phone: [phone] Relation: Spouse  Code Status:  DNR Goals of care: Advanced Directive information    12/18/2023    1:15 PM  Advanced Directives  Does Patient Have a Medical Advance Directive? Yes  Type of Advance Directive Living will;Out of facility DNR (pink MOST or yellow form)  Does patient want to make changes to medical advance directive? No - Patient declined     Chief Complaint  Patient presents with   Medical Management of Chronic Issues    Medical Management of Chronic Issues.                                          Hospice care visit  HPI:  Pt is a 83 y.o. female  with PMH of CHF, CKD, Hypothyroidism,Rt THA s/p resection is  seen today for medical management of chronic diseases.    Pt was recently hospitalized in August for multifocal pneumonia and subsequently declined and family agreed with hospice.  Until recently pt had poor PO intake and did not want to get out of her bed. Since last week she is eating better, she is coming out of her room.  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    Past Medical History:  Diagnosis Date   Benign hypertension with chronic kidney disease, stage III (HCC)    Overview:  Last Assessment & Plan:  Usually the patient is hypertensive. However today her blood pressure is 105 systolic. She has been feeling fatigued with some lightheadedness. Part of this may be related to her lower blood pressure. Her pressure may be improved with her decrease in salt and fluid intake. I've instructed her to put her lisinopril/hctz on hold. 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 It was possibly reentrant. She does well with diltiazem . No change in therapy.   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   EYE SURGERY Right 12/11   Lumbar Infusion  2011   MOLE  REMOVAL  06/17/2015   TOTAL KNEE ARTHROPLASTY Left 6/11   TOTAL KNEE ARTHROPLASTY Right 06/13/2011    Allergies  Allergen Reactions   Ciprofloxacin  Other (See Comments)    Unknown    Keflex [Cephalexin] Diarrhea   Macrobid [Nitrofurantoin] Hives   Amoxicillin Rash    Has since tolerated Zosyn  inpatient   Cefadroxil  Rash    Allergy not listed on [facility]     Outpatient Encounter Medications as of 02/05/2024  Medication Sig   acetaminophen  (TYLENOL ) 325 MG tablet Take 650 mg by mouth every 4 (four) hours as needed for fever, moderate pain (pain score 4-6) or mild pain (pain score 1-3).   albuterol  (VENTOLIN  HFA) 108 (90 Base) MCG/ACT inhaler Inhale 2 puffs into the lungs every 6 (six) hours as needed for wheezing or shortness of breath.   bifidobacterium infantis (ALIGN) capsule Take 1 capsule by mouth daily.   docusate sodium  (COLACE) 100 MG capsule Take 100 mg by mouth daily as needed for mild constipation or moderate constipation.   furosemide  (LASIX ) 20 MG tablet Take 20 mg by mouth daily.   guaiFENesin -dextromethorphan (ROBITUSSIN DM) 100-10 MG/5ML syrup Take 10 mLs by mouth every 4 (four) hours as needed for cough.   hydrocortisone  cream 1 % Apply 1 Application topically See admin instructions. Apply to urogenital area topically at bedtime every other day for itchy areas related to vaginitis.   ipratropium-albuterol  (DUONEB) 0.5-2.5 (3) MG/3ML SOLN Inhale 3 mLs into the lungs every 8 (eight) hours. And every 8 hours as needed per [facility]   ketoconazole (NIZORAL) 2 % cream Apply 1 Application topically 2 (two) times daily. Apply to peri area   ketotifen  (ZADITOR ) 0.035 % ophthalmic solution Place 1 drop into both eyes 2 (two) times daily.   loperamide (IMODIUM) 2 MG capsule Take 2 mg by mouth every 4 (four) hours as needed for diarrhea or loose stools.   morphine  20 MG/5ML solution Take 0.25 mLs by mouth every 6 (six) hours as needed for pain.   nystatin  (MYCOSTATIN/NYSTOP) powder Apply 1 application  topically in the morning, at noon, in the evening, and at bedtime. To genital areas and inner thigh   ondansetron  (ZOFRAN ) 4 MG tablet Take 4 mg by mouth every 8 (eight) hours as needed for nausea or vomiting.   pantoprazole  (PROTONIX ) 40 MG tablet Take 40 mg by mouth daily.   potassium chloride  (KLOR-CON  M) 10 MEQ tablet Take 30 mEq by mouth daily.   traMADol (ULTRAM) 50 MG tablet Take 50 mg by mouth every 6 (six) hours as needed for moderate pain (pain score 4-6) or severe pain (pain score 7-10).   triamcinolone cream (KENALOG) 0.1 % Apply 1 Application topically 2 (two) times daily. Apply to genital  area   triamcinolone cream (KENALOG) 0.5 % Apply 1 Application topically at bedtime. Apply to lower back skin folds rash   Calcium Carb-Cholecalciferol  (CALCIUM PLUS VITAMIN D3 PO) Take 1 tablet by mouth daily. Vitamin D3 20mcg + Calcium 600 (Patient not taking: Reported on 02/05/2024)   Cholecalciferol  (VITAMIN D3) 50 MCG (2000 UT) TABS Take 4,000 Units by mouth daily. (Patient not taking: Reported on 02/05/2024)   Cranberry 500 MG TABS Take 500 mg by mouth in the morning and at bedtime. (Patient not taking: Reported on 02/05/2024)   cyanocobalamin 1000 MCG tablet Take 1,000 mcg by mouth daily. (Patient not taking: Reported on 02/05/2024)   ferrous sulfate  325 (65 FE) MG EC tablet Take 325 mg by mouth daily. (Patient not taking: Reported on 02/05/2024)   FOSFOMYCIN TROMETHAMINE  PO Take 3 mg by mouth as directed. Give 1 packet by mouth one time only related to URINARY TRACT INFECTION, SITE NOT SPECIFIED (N39.0) for 1 Day (Patient not taking: Reported on 02/05/2024)   levothyroxine  (SYNTHROID ) 100 MCG tablet Take 100 mcg by mouth daily before breakfast. (Patient not taking: Reported on 02/05/2024)   loratadine  (CLARITIN ) 10 MG tablet Take 10 mg by mouth daily. (Patient not taking: Reported on 02/05/2024)   No facility-administered encounter medications  on file as of 02/05/2024.    Review of Systems  Immunization History  Administered Date(s) Administered   Fluzone Influenza virus vaccine,trivalent (IIV3), split virus 02/20/2012, 02/11/2013, 02/02/2016, 01/11/2017, 02/04/2019   INFLUENZA, HIGH DOSE SEASONAL PF 02/04/2015, 01/11/2017, 01/30/2018, 02/05/2020, 01/25/2023   Influenza Split 01/28/2008, 02/17/2009   Influenza-Unspecified 02/16/2022, 01/30/2024   Moderna Covid-19 Vaccine Bivalent Booster 43yrs & up 04/13/2023   Moderna SARS-COV2 Booster Vaccination 02/24/2022   Moderna Sars-Covid-2 Vaccination 04/29/2019, 05/09/2019, 05/27/2019, 03/03/2020, 09/04/2020, 11/04/2020   PPD Test 02/05/2021, 02/19/2021, 07/14/2022, 10/19/2022   Pfizer Covid-19 Vaccine Bivalent Booster 8yrs & up 02/09/2021   Pneumococcal Conjugate-13 08/30/2013   Pneumococcal Polysaccharide-23 11/03/2005   RSV,unspecified 04/22/2022   Td 08/01/2001, 09/05/2019   Td (Adult) 08/01/2001   Tdap 09/22/2010, 11/24/2020   Zoster Recombinant(Shingrix) 11/08/2016, 01/11/2017   Zoster, Live 11/04/2005, 11/08/2016, 01/11/2017   Zoster, Unspecified 01/11/2017   Pertinent  Health Maintenance Due  Topic Date Due   Influenza Vaccine  Completed   DEXA SCAN  Completed      04/15/2022   10:15 AM 04/27/2022   11:35 AM 05/31/2022    9:34 AM 07/26/2022    9:31 AM 10/21/2022    9:31 AM  Fall Risk  Falls in the past year? 0 0 0 0 0  Was there an injury with Fall? 0 0 0 0 0  Fall Risk Category Calculator 0 0 0 0 0  Fall Risk Category (Retired) Low  Low      (RETIRED) Patient Fall Risk Level Moderate fall risk  Moderate fall risk      Patient at Risk for Falls Due to History of fall(s) History of fall(s) History of fall(s)  No Fall Risks  Fall risk Follow up Falls evaluation completed  Falls evaluation completed  Falls evaluation completed  Falls evaluation completed     Data saved with a previous flowsheet row definition   Functional Status Survey:    Vitals:   02/05/24  1212  BP: 111/67  Pulse: 66  Resp: 20  Temp: (!) 97.2 F (36.2 C)  SpO2: 97%  Weight: 280 lb (127 kg)  Height: 5' 8 (1.727 m)   Body mass index is 42.57 kg/m. Physical Exam Constitutional:  Appearance: Normal appearance.  HENT:     Head: Normocephalic and atraumatic.  Cardiovascular:     Rate and Rhythm: Normal rate and regular rhythm.  Pulmonary:     Effort: Pulmonary effort is normal. No respiratory distress.     Breath sounds: Normal breath sounds. No wheezing.  Abdominal:     General: Bowel sounds are normal. There is no distension.     Tenderness: There is no abdominal tenderness. There is no guarding or rebound.     Comments:    Musculoskeletal:     Comments: Rt hip chronic wound 1 cm deep, draining clear liquid  No erythema or tenderness noted.  Neurological:     Mental Status: She is alert. Mental status is at baseline.     Motor: No weakness.     Labs reviewed: Recent Labs    12/15/23 0507 12/16/23 0650 12/17/23 0532 12/26/23 0000 12/29/23 0000 01/02/24 0000  NA 139 137 139 140 136* 135*  K 3.3* 3.6 3.6 4.7 4.6 4.5  CL 113* 112* 110 114* 109* 108  CO2 17* 15* 18* 18 15 19   GLUCOSE 73 70 63*  --   --   --   BUN 24* 25* 24* 48* 42* 38*  CREATININE 2.24* 2.45* 2.34* 2.3* 2.1* 2.3*  CALCIUM 8.7* 8.5* 8.5* 8.6* 8.7 8.4*  MG  --  2.1 2.0  --   --   --    Recent Labs    12/10/23 1822 12/16/23 0650 12/17/23 0532 12/29/23 0000 01/02/24 0000  AST 14* 11* 10* 13 10*  ALT 15 11 10 9 8   ALKPHOS 111 82 76 119 105  BILITOT 0.5 0.5 0.4  --   --   PROT 7.1 6.2* 5.8*  --   --   ALBUMIN 3.5 3.0* 2.7* 3.4* 2.6*   Recent Labs    12/15/23 0507 12/16/23 0650 12/17/23 0532 12/29/23 0000 01/02/24 0000  WBC 9.4 8.3 9.4 13.4 10.2  NEUTROABS  --  5.3 6.3  --  8,038.00  HGB 10.6* 10.9* 9.9* 11.1* 9.6*  HCT 36.6 37.6 32.8* 35* 31*  MCV 92.7 94.0 92.4  --   --   PLT 171 183 198 239 241   Lab Results  Component Value Date   TSH 5.752 (H) 12/16/2023    No results found for: HGBA1C No results found for: CHOL, HDL, LDLCALC, LDLDIRECT, TRIG, CHOLHDL  Significant Diagnostic Results in last 30 days:  No results found.  Assessment/Plan   Hospice care  Pt seems comfortable and not in distress Cont with morphine  , tramadol prn   CHF Cont with lasix    Chronic Rt hip wound  Draining clear liquid Cont with daily dressing changes  GERD Cont with pantoprazole   CKD Avoid nephrotoxic meds

## 2024-02-23 ENCOUNTER — Telehealth: Payer: Self-pay

## 2024-02-23 NOTE — Telephone Encounter (Signed)
 Spoke with selinda regardng instructions that is located in [facility] chart and confirmed.   Copied from CRM [id number]. Topic: Clinical - Prescription Issue >> Feb 23, 2024 12:47 PM Zane F wrote: Reason for CRM:   Caller: Selinda  Calling From: [facility]  Prescription In question: ipratropium-albuterol  (DUONEB) 0.5-2.5 (3) MG/3ML SOLN   Calling to receive clarification on the new order which instructions states to inhale 1 vial of the prescription at bedtime. The previous prescription for the same medication instructed the patient to take the same dosage of the prescription every 8 hours. They are looking to clarify which instruction should be followed currently.   Prescription was received yesterday at 12:37 PM EST.   Selinda will be in office until 5pm today.   Direct Number: [phone]  Specialist was instructed to forward message to [facility] for further review from CAL.

## 2024-02-27 ENCOUNTER — Non-Acute Institutional Stay (SKILLED_NURSING_FACILITY): Payer: Self-pay | Admitting: Nurse Practitioner

## 2024-02-27 ENCOUNTER — Encounter: Payer: Self-pay | Admitting: Nurse Practitioner

## 2024-02-27 DIAGNOSIS — J439 Emphysema, unspecified: Secondary | ICD-10-CM | POA: Diagnosis not present

## 2024-02-27 DIAGNOSIS — I5032 Chronic diastolic (congestive) heart failure: Secondary | ICD-10-CM | POA: Diagnosis not present

## 2024-02-27 DIAGNOSIS — I10 Essential (primary) hypertension: Secondary | ICD-10-CM

## 2024-02-27 DIAGNOSIS — K2100 Gastro-esophageal reflux disease with esophagitis, without bleeding: Secondary | ICD-10-CM

## 2024-02-27 DIAGNOSIS — D649 Anemia, unspecified: Secondary | ICD-10-CM | POA: Diagnosis not present

## 2024-02-27 DIAGNOSIS — R627 Adult failure to thrive: Secondary | ICD-10-CM | POA: Diagnosis not present

## 2024-02-27 NOTE — Assessment & Plan Note (Signed)
 stable, on  Pantoprazole, off  Omeprazole, prn Zofran.

## 2024-02-27 NOTE — Assessment & Plan Note (Signed)
 Blood pressure is controlled, continue furosemide 

## 2024-02-27 NOTE — Progress Notes (Signed)
 Location:   [facility]   Place of Service:   [facility] Provider: [facility] Alla Sloma NP  Asante Blanda X, NP  Patient Care Team: Chiquetta Langner X, NP as PCP - General (Internal Medicine) [name], MD as PCP - Cardiology (Cardiology) [name], MD as PCP - Sleep Medicine (Cardiology) [name], MD (Sports Medicine) [name], MD (Ophthalmology) [name], [name], MD (Dermatology) [name], MD (Gastroenterology) [name], [name], MD (Nephrology) [name], DC as Referring Physician (Chiropractic Medicine)  Extended Emergency Contact Information Primary Emergency Contact: Choung,[name] Address: [address]          [place], [place] [address] [place] Home Phone: [phone] Mobile Phone: [phone] Relation: Spouse  Code Status: DNR Goals of care: Advanced Directive information    02/27/2024   12:42 PM  Advanced Directives  Does Patient Have a Medical Advance Directive? Yes  Type of Advance Directive Out of facility DNR (pink MOST or yellow form)  Does patient want to make changes to medical advance directive? No - Patient declined  Pre-existing out of facility DNR order (yellow form or pink MOST form) Yellow form placed in chart (order not valid for inpatient use);Pink MOST form placed in chart (order not valid for inpatient use)     Chief Complaint  Patient presents with  . Acute Visit    O2 desaturation , wheezing  [id number] Admitted : 07/14/2022    HPI:  Pt is a 83 y.o. female seen today for an acute visit for wheezing and O2 desaturation  Nonproductive cough wheezing on and off, SAT O2 87% on room air and up to the 90s on O2 2 L/min via nasal cannula.  Denied chest pain, palpitation, or shortness of breath at rest.  The patient is afebrile.  Already taking DuoNeb every 6 hours.  Chest x-ray 02/26/2024 showed nonspecific perihilar inflammatory changes, emphysema.    Failure to thrive, desires comfort  measures                          01/01/24 Deconditioning, pt/husband-HPOA desires MOST comfort measures, no ABT/IVF/hospitalization/feeding tube, desires Hospice service.                           lower back pain, prn Tramadol and Tylenol  available to her, X-ray lumbar spine, sacrococcyx 06/08/23 negative fx.                           Hospitalized 07/08/22 for CHF, abscess of the right hip which previous infected, treated, and prosthesis was removed and never replaced.               Hospitalized 07/02/22-07/07/22 for the right hip cellulitis, CT hip in hospital fluid collection unclear if it communicates with the joint space, Ortho not felt to be related to the joint space and may be an old fluid collection, aspiration not recommended. 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 TDWB. Power wc. 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 Fully treated, then off antibiotics. 11/17/22 ID no  infection, no tx, healed.                           CKD stage 4, followed by Nephrology, Bun/creat 38/2.27 01/02/24             Anemia, post op, s/p EGD no active bleed. S/p 6 u PRBC transfusion, ASA was dc'd, off Fe, Vit B12,  Iron 61, Vit B12 216, Hgb 9.6 01/02/24, Hx of  Vit B12 1000mcg every day.                          HTN on Furosemide , off Spironolactone .              Morbid obesity             OSA CPAP             Chronic diarrhea, prn Imodium, Align                          GERD, stable, on  Pantoprazole , off  Omeprazole, prn Zofran .             CHF/Edema, taking Furosemide , BNP 167 01/02/24               Hypothyroidism, TSH 5.752 12/16/23,  off Levothyroxine  per Hospice               Past Medical History:  Diagnosis Date  . Benign hypertension with chronic kidney disease, stage III (HCC)     Overview:  Last Assessment & Plan:  Usually the patient is hypertensive. However today her blood pressure is 105 systolic. She has been feeling fatigued with some lightheadedness. Part of this may be related to her lower blood pressure. Her pressure may be improved with her decrease in salt and fluid intake. I've instructed her to put her lisinopril/hctz on hold. I've asked her to see her primary   . Cardiac disease 03/10/2014  . Chronic diarrhea 07/27/2015  . Edema 07/27/2015  . Ejection fraction 2004   Normal, echo,   . Gastroesophageal reflux disease   . GERD (gastroesophageal reflux disease)   . Heart disease 03/10/2014  . Hypertension   . Left bundle branch block 09/01/2020  . Obesity   . Obesity (BMI 30-39.9) 09/25/2017  . OSA (obstructive sleep apnea)    mild with AHI 6.75 - on CPAP  . Preop cardiovascular exam 11/2010   Cardiac clearance for knee surgery   . Sleep apnea 03/10/2014  . Supraventricular tachycardia    Documented episode in the past, possibly reentrant tachycardia  Overview:  Overview:  Documented episode in the past, 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 It was possibly reentrant. She does well with diltiazem . No change in therapy.  . 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  . Thyroid  disease   . Urinary incontinence    Past Surgical History:  Procedure Laterality Date  . CATARACT EXTRACTION Left 8/11  . CATARACT EXTRACTION Right 3/12  . CHOLECYSTECTOMY  1994  . Copsilotomy Laser Treatment Left 6/12   eye  . ELBOW SURGERY  8/12  . 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 EYE SURGERY Right 12/11  . Lumbar Infusion  2011  . MOLE REMOVAL  06/17/2015  .  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 TOTAL KNEE ARTHROPLASTY Right 06/13/2011    Allergies  Allergen Reactions  . Ciprofloxacin  Other (See  Comments)    Unknown   . Keflex [Cephalexin] Diarrhea  . Macrobid [Nitrofurantoin] Hives  . Amoxicillin Rash    Has since tolerated Zosyn  inpatient  . Cefadroxil  Rash    Allergy not listed on [facility]     Allergies as of 02/27/2024       Reactions   Ciprofloxacin  Other (See Comments)   Unknown    Keflex [cephalexin] Diarrhea   Macrobid [nitrofurantoin] Hives   Amoxicillin Rash   Has since tolerated Zosyn  inpatient   Cefadroxil  Rash   Allergy not listed on [facility]: Lambert Rhonda W         Medication List        Accurate as of February 27, 2024  3:38 PM. If you have any questions, ask your nurse or doctor.          STOP taking these medications    CALCIUM PLUS VITAMIN D3 PO Stopped by: Kaidence Sant X Shadow Stiggers   Cranberry 500 MG Tabs Stopped by: Brigid Vandekamp X Yasmin Dibello   cyanocobalamin 1000 MCG tablet Stopped by: Akira Adelsberger X Ori Trejos   ferrous sulfate  325 (65 FE) MG EC tablet Stopped by: Racheal Mathurin X Dow Blahnik   FOSFOMYCIN TROMETHAMINE  PO Stopped by: Sawyer Kahan X Elley Harp   levothyroxine  100 MCG tablet Commonly known as: SYNTHROID  Stopped by: Glenis Musolf X Marvell Stavola   loratadine  10 MG tablet Commonly known as: CLARITIN  Stopped by: Briceida Rasberry X Jeremy Mclamb   morphine  20 MG/5ML solution Stopped by: Jun Rightmyer X Henretta Quist   Vitamin D3 50 MCG (2000 UT) Tabs Stopped by: Tondalaya Perren X Colie Fugitt       TAKE these medications    acetaminophen  325 MG tablet Commonly known as: TYLENOL  Take 650 mg by mouth every 4 (four) hours as needed for fever, moderate pain (pain score 4-6) or mild pain (pain score 1-3).   albuterol  108 (90 Base) MCG/ACT inhaler Commonly known as: VENTOLIN  HFA Inhale 2 puffs into the lungs every 6 (six) hours as needed for wheezing or shortness of breath.   bifidobacterium infantis capsule Take 1 capsule by mouth daily.   docusate sodium  100 MG capsule Commonly known as: COLACE Take 100 mg by mouth daily as needed for mild constipation or moderate constipation.   furosemide  20 MG tablet Commonly known as: LASIX  Take 20 mg by mouth daily.    guaiFENesin -dextromethorphan 100-10 MG/5ML syrup Commonly known as: ROBITUSSIN DM Take 10 mLs by mouth every 4 (four) hours as needed for cough.   hydrocortisone  cream 1 % Apply 1 Application topically See admin instructions. Apply to urogenital area topically at bedtime every other day for itchy areas related to vaginitis.   ipratropium-albuterol  0.5-2.5 (3) MG/3ML Soln Commonly known as: DUONEB Inhale 3 mLs into the lungs every 8 (eight) hours. And every 8 hours as needed per [facility]   ketoconazole 2 % cream Commonly known as: NIZORAL Apply 1 Application topically 2 (two) times daily. Apply to peri area   ketotifen  0.035 % ophthalmic solution Commonly known as: ZADITOR  Place 1 drop into both eyes 2 (two) times daily.   loperamide 2 MG capsule Commonly known as: IMODIUM Take 2 mg by mouth every 4 (four) hours as needed for diarrhea or loose stools.   nystatin powder Commonly known as: MYCOSTATIN/NYSTOP Apply 1 application  topically in the morning, at noon, in the evening, and at bedtime. To genital areas and inner thigh   ondansetron   4 MG tablet Commonly known as: ZOFRAN  Take 4 mg by mouth every 8 (eight) hours as needed for nausea or vomiting.   potassium chloride  10 MEQ tablet Commonly known as: KLOR-CON  M Take 30 mEq by mouth daily.   Protonix  40 MG tablet Generic drug: pantoprazole  Take 40 mg by mouth daily.   traMADol 50 MG tablet Commonly known as: ULTRAM Take 50 mg by mouth every 6 (six) hours as needed for moderate pain (pain score 4-6) or severe pain (pain score 7-10).   triamcinolone cream 0.5 % Commonly known as: KENALOG Apply 1 Application topically at bedtime. Apply to lower back skin folds rash   triamcinolone cream 0.1 % Commonly known as: KENALOG Apply 1 Application topically 2 (two) times daily. Apply to genital area        Review of Systems  Constitutional:  Positive for appetite change and fatigue. Negative for fever.   HENT:  Negative for congestion, sore throat and trouble swallowing.   Eyes:  Negative for visual disturbance.  Respiratory:  Positive for cough and wheezing. Negative for shortness of breath.   Cardiovascular:  Positive for leg swelling.  Gastrointestinal:  Negative for constipation.  Genitourinary:  Positive for frequency. Negative for dysuria and urgency.  Musculoskeletal:  Positive for arthralgias, back pain and gait problem.       Mid to lower back pain, worsened, positional Lower back pain  Skin:  Positive for wound. Negative for color change.  Neurological:  Negative for speech difficulty and weakness.  Psychiatric/Behavioral:  Negative for confusion and sleep disturbance. The patient is not nervous/anxious.     Immunization History  Administered Date(s) Administered  . Fluzone Influenza virus vaccine,trivalent (IIV3), split virus 02/20/2012, 02/11/2013, 02/02/2016, 01/11/2017, 02/04/2019  . INFLUENZA, HIGH DOSE SEASONAL PF 02/04/2015, 01/11/2017, 01/30/2018, 02/05/2020, 01/25/2023  . Influenza Split 01/28/2008, 02/17/2009  . Influenza-Unspecified 02/16/2022, 01/30/2024  . Moderna Covid-19 Vaccine Bivalent Booster 77yrs & up 04/13/2023  . Moderna SARS-COV2 Booster Vaccination 02/24/2022  . Moderna Sars-Covid-2 Vaccination 04/29/2019, 05/09/2019, 05/27/2019, 03/03/2020, 09/04/2020, 11/04/2020  . PPD Test 02/05/2021, 02/19/2021, 07/14/2022, 10/19/2022  . Pfizer Covid-19 Vaccine Bivalent Booster 19yrs & up 02/09/2021  . Pneumococcal Conjugate-13 08/30/2013  . Pneumococcal Polysaccharide-23 11/03/2005  . RSV,unspecified 04/22/2022  . Td 08/01/2001, 09/05/2019  . Td (Adult) 08/01/2001  . Tdap 09/22/2010, 11/24/2020  . Unspecified SARS-COV-2 Vaccination 02/19/2024  . Zoster Recombinant(Shingrix) 11/08/2016, 01/11/2017  . Zoster, Live 11/04/2005, 11/08/2016, 01/11/2017  . Zoster, Unspecified 01/11/2017   Pertinent  Health Maintenance Due  Topic Date Due  . Influenza Vaccine   Completed  . DEXA SCAN  Completed      04/15/2022   10:15 AM 04/27/2022   11:35 AM 05/31/2022    9:34 AM 07/26/2022    9:31 AM 10/21/2022    9:31 AM  Fall Risk  Falls in the past year? 0 0 0 0 0  Was there an injury with Fall? 0 0 0 0 0  Fall Risk Category Calculator 0 0 0 0 0  Fall Risk Category (Retired) Low  Low      (RETIRED) Patient Fall Risk Level Moderate fall risk  Moderate fall risk      Patient at Risk for Falls Due to History of fall(s) History of fall(s) History of fall(s)  No Fall Risks  Fall risk Follow up Falls evaluation completed  Falls evaluation completed  Falls evaluation completed  Falls evaluation completed     Data saved with a previous flowsheet row definition   Functional Status  Survey:    Vitals:   02/27/24 1236  BP: 129/70  Pulse: 76  Temp: (!) 97.1 F (36.2 C)  SpO2: (!) 87%  Weight: 294 lb 12.8 oz (133.7 kg)  Height: 5' 8 (1.727 m)   Body mass index is 44.82 kg/m. Physical Exam Vitals and nursing note reviewed.  Constitutional:      Appearance: She is obese.     Comments: fatigue  HENT:     Head: Normocephalic and atraumatic.     Nose: Nose normal.  Eyes:     Extraocular Movements: Extraocular movements intact.     Conjunctiva/sclera: Conjunctivae normal.     Pupils: Pupils are equal, round, and reactive to light.  Cardiovascular:     Rate and Rhythm: Normal rate and regular rhythm.     Heart sounds: No murmur heard. Pulmonary:     Breath sounds: Wheezing present. No rhonchi or rales.  Abdominal:     General: Bowel sounds are normal.     Palpations: Abdomen is soft.     Tenderness: There is no abdominal tenderness.  Musculoskeletal:        General: Tenderness present.     Cervical back: Normal range of motion and neck supple.     Right lower leg: Edema present.     Left lower leg: Edema present.     Comments: R hip, prosthetic hip removed. Dependent edema moderate. Lower back pain i no spine ns positional, no spinous process  tenderness palpated, pain is positional  Skin:    General: Skin is warm and dry.     Findings: Erythema present.     Comments: Edema mostly in the dependent areas, buttock, thighs, legs.  More erythema, swelling, mild warmth in the right hip/lateral thigh area, a small open tunneling wound R hip with yellow purulent drainage.   Neurological:     General: No focal deficit present.     Mental Status: She is alert and oriented to person, place, and time.     Gait: Gait abnormal.  Psychiatric:        Mood and Affect: Mood normal.        Behavior: Behavior normal.        Thought Content: Thought content normal.        Judgment: Judgment normal.     Labs reviewed: Recent Labs    12/15/23 0507 12/16/23 0650 12/17/23 0532 12/26/23 0000 12/29/23 0000 01/02/24 0000  NA 139 137 139 140 136* 135*  K 3.3* 3.6 3.6 4.7 4.6 4.5  CL 113* 112* 110 114* 109* 108  CO2 17* 15* 18* 18 15 19   GLUCOSE 73 70 63*  --   --   --   BUN 24* 25* 24* 48* 42* 38*  CREATININE 2.24* 2.45* 2.34* 2.3* 2.1* 2.3*  CALCIUM 8.7* 8.5* 8.5* 8.6* 8.7 8.4*  MG  --  2.1 2.0  --   --   --    Recent Labs    12/10/23 1822 12/16/23 0650 12/17/23 0532 12/29/23 0000 01/02/24 0000  AST 14* 11* 10* 13 10*  ALT 15 11 10 9 8   ALKPHOS 111 82 76 119 105  BILITOT 0.5 0.5 0.4  --   --   PROT 7.1 6.2* 5.8*  --   --   ALBUMIN 3.5 3.0* 2.7* 3.4* 2.6*   Recent Labs    12/15/23 0507 12/16/23 0650 12/17/23 0532 12/29/23 0000 01/02/24 0000  WBC 9.4 8.3 9.4 13.4 10.2  NEUTROABS  --  5.3  6.3  --  8,038.00  HGB 10.6* 10.9* 9.9* 11.1* 9.6*  HCT 36.6 37.6 32.8* 35* 31*  MCV 92.7 94.0 92.4  --   --   PLT 171 183 198 239 241   Lab Results  Component Value Date   TSH 5.752 (H) 12/16/2023   No results found for: HGBA1C No results found for: CHOL, HDL, LDLCALC, LDLDIRECT, TRIG, CHOLHDL  Significant Diagnostic Results in last 30 days:  No results found.  Assessment/Plan: Emphysema lung  (HCC) Nonproductive cough wheezing on and off, SAT O2 87% on room air and up to the 90s on O2 2 L/min via nasal cannula.  Denied chest pain, palpitation, or shortness of breath at rest.  The patient is afebrile.  Already taking DuoNeb every 6 hours.  Chest x-ray 02/26/2024 showed nonspecific perihilar inflammatory changes, emphysema.  Will add Trelegy ellipta, may consider prednisone only if no better  Failure to thrive in adult Gradually declining  Chronic heart failure with preserved ejection fraction (HFpEF) (HCC) Euvolemic, continue furosemide   Chronic anemia , s/p EGD no active bleed. S/p 6 u PRBC transfusion, ASA was dc'd, off Fe, Vit B12,  Iron 61, Vit B12 216, Hgb 9.6 01/02/24, Hx of  Vit B12 1000mcg every day.               Essential hypertension Blood pressure is controlled, continue furosemide   GERD (gastroesophageal reflux disease) stable, on  Pantoprazole , off  Omeprazole, prn Zofran .    Family/ staff Communication: Plan of care reviewed with the patient, patient's husband-H POA, charge nurse  Labs/tests ordered: Chest x-ray 02/26/2024

## 2024-02-27 NOTE — Assessment & Plan Note (Signed)
,   s/p EGD no active bleed. S/p 6 u PRBC transfusion, ASA was dc'd, off Fe, Vit B12,  Iron 61, Vit B12 216, Hgb 9.6 01/02/24, Hx of  Vit B12 1000mcg every day.

## 2024-02-27 NOTE — Assessment & Plan Note (Signed)
 Euvolemic, continue furosemide 

## 2024-02-27 NOTE — Assessment & Plan Note (Signed)
 TSH 5.752 12/16/23,  off Levothyroxine  per Hospice

## 2024-02-27 NOTE — Assessment & Plan Note (Signed)
 Gradually declining

## 2024-02-27 NOTE — Assessment & Plan Note (Signed)
 Nonproductive cough wheezing on and off, SAT O2 87% on room air and up to the 90s on O2 2 L/min via nasal cannula.  Denied chest pain, palpitation, or shortness of breath at rest.  The patient is afebrile.  Already taking DuoNeb every 6 hours.  Chest x-ray 02/26/2024 showed nonspecific perihilar inflammatory changes, emphysema.  Will add Trelegy ellipta, may consider prednisone only if no better

## 2024-03-18 ENCOUNTER — Encounter: Payer: Self-pay | Admitting: Nurse Practitioner

## 2024-03-18 ENCOUNTER — Non-Acute Institutional Stay (SKILLED_NURSING_FACILITY): Payer: Self-pay | Admitting: Nurse Practitioner

## 2024-03-18 DIAGNOSIS — I5032 Chronic diastolic (congestive) heart failure: Secondary | ICD-10-CM | POA: Diagnosis not present

## 2024-03-18 DIAGNOSIS — E039 Hypothyroidism, unspecified: Secondary | ICD-10-CM | POA: Diagnosis not present

## 2024-03-18 DIAGNOSIS — D500 Iron deficiency anemia secondary to blood loss (chronic): Secondary | ICD-10-CM

## 2024-03-18 DIAGNOSIS — K2100 Gastro-esophageal reflux disease with esophagitis, without bleeding: Secondary | ICD-10-CM

## 2024-03-18 DIAGNOSIS — I10 Essential (primary) hypertension: Secondary | ICD-10-CM | POA: Diagnosis not present

## 2024-03-18 DIAGNOSIS — Z89621 Acquired absence of right hip joint: Secondary | ICD-10-CM

## 2024-03-18 DIAGNOSIS — J439 Emphysema, unspecified: Secondary | ICD-10-CM

## 2024-03-18 DIAGNOSIS — N184 Chronic kidney disease, stage 4 (severe): Secondary | ICD-10-CM

## 2024-03-18 DIAGNOSIS — R627 Adult failure to thrive: Secondary | ICD-10-CM

## 2024-03-18 NOTE — Assessment & Plan Note (Signed)
 post op, s/p EGD no active bleed. S/p 6 u PRBC transfusion, ASA was dc'd, off Fe, Vit B12,  Iron 61, Vit B12 216, Hgb 9.6 01/02/24, Hx of  Vit B12 1000mcg every day.

## 2024-03-18 NOTE — Assessment & Plan Note (Signed)
 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 TDWB. Power wc. 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

## 2024-03-18 NOTE — Assessment & Plan Note (Signed)
 Stabilized Under Hospice service for supportive care

## 2024-03-18 NOTE — Assessment & Plan Note (Signed)
 stable, on  Pantoprazole, off  Omeprazole, prn Zofran.

## 2024-03-18 NOTE — Assessment & Plan Note (Signed)
 TSH 5.752 12/16/23,  off Levothyroxine  per Hospice The patient is doing better with her eating, hydration, up in an electric scooter daily to visit her husband apartment in the stroller around facility, may consider resume levothyroxine  if her patient/H POA desire 03/19/24 resume Levothyroxine  100mcg every day.

## 2024-03-18 NOTE — Assessment & Plan Note (Addendum)
 taking Furosemide , BNP 167 01/02/24, DOE

## 2024-03-18 NOTE — Assessment & Plan Note (Signed)
 Blood pressure is controlled  on Furosemide , off Spironolactone .

## 2024-03-18 NOTE — Assessment & Plan Note (Addendum)
 improved on Trelegy ellipta and DuoNeb every 6 hours. Chest x-ray 02/26/2024 showed nonspecific perihilar inflammatory changes, emphysema.

## 2024-03-18 NOTE — Assessment & Plan Note (Signed)
 followed by Nephrology, Bun/creat 38/2.27 01/02/24

## 2024-03-18 NOTE — Progress Notes (Signed)
 Location:   [facility] Room Number: 61 Place of Service:    Provider: Larwance Hark NP  Mariapaula Krist X, NP  Patient Care Team: Sua Spadafora X, NP as PCP - General (Internal Medicine) [name], MD as PCP - Cardiology (Cardiology) [name], MD as PCP - Sleep Medicine (Cardiology) [name], MD (Sports Medicine) [name], MD (Ophthalmology) [name], [name], MD (Dermatology) [name], MD (Gastroenterology) [name], [name], MD (Nephrology) [name], DC as Referring Physician (Chiropractic Medicine)  Extended Emergency Contact Information Primary Emergency Contact: Wynn,[name] Address: [address]          [place], [place] [address] [place] Home Phone: [phone] Mobile Phone: [phone] Relation: Spouse  Code Status:  DNR Goals of care: Advanced Directive information    02/27/2024   12:42 PM  Advanced Directives  Does Patient Have a Medical Advance Directive? Yes  Type of Advance Directive Out of facility DNR (pink MOST or yellow form)  Does patient want to make changes to medical advance directive? No - Patient declined  Pre-existing out of facility DNR order (yellow form or pink MOST form) Yellow form placed in chart (order not valid for inpatient use);Pink MOST form placed in chart (order not valid for inpatient use)     Chief Complaint  Patient presents with  . Medical Management of Chronic Issues    HPI:  Pt is a 83 y.o. female seen today for medical management of chronic diseases.     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             Chest x-ray 02/26/2024 showed nonspecific perihilar inflammatory changes, emphysema.                          Failure to thrive, desires comfort measures                          01/01/24 Deconditioning, pt/husband-HPOA desires MOST comfort measures, no ABT/IVF/hospitalization/feeding tube, desires Hospice service.                            lower back pain, prn Tramadol and Tylenol  available to her, X-ray lumbar spine, sacrococcyx 06/08/23 negative fx.                           Hospitalized 07/08/22 for CHF, abscess of the right hip which previous infected, treated, and prosthesis was removed and never replaced.               Hospitalized 07/02/22-07/07/22 for the right hip cellulitis, CT hip in hospital fluid collection unclear if it communicates with the joint space, Ortho not felt to be related to the joint space and may be an old fluid collection, aspiration not recommended. 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 TDWB. Power wc. 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 Fully treated, then off antibiotics. 11/17/22 ID no infection, no tx, healed.  CKD stage 4, followed by Nephrology, Bun/creat 38/2.27 01/02/24             Anemia, post op, s/p EGD no active bleed. S/p 6 u PRBC transfusion, ASA was dc'd, off Fe, Vit B12,  Iron 61, Vit B12 216, Hgb 9.6 01/02/24, Hx of  Vit B12 1000mcg every day.                          HTN on Furosemide , off Spironolactone .              Morbid obesity             OSA CPAP             Chronic diarrhea, prn Imodium, Align                          GERD, stable, on  Pantoprazole , off  Omeprazole, prn Zofran .             CHF/Edema, taking Furosemide , BNP 167 01/02/24               Hypothyroidism, TSH 5.752 12/16/23,  off Levothyroxine  per Hospice                  Past Medical History:  Diagnosis Date  . Benign hypertension with chronic kidney disease, stage III (HCC)    Overview:  Last Assessment & Plan:  Usually the patient is hypertensive. However today her blood pressure is 105 systolic. She has been feeling fatigued with some  lightheadedness. Part of this may be related to her lower blood pressure. Her pressure may be improved with her decrease in salt and fluid intake. I've instructed her to put her lisinopril/hctz on hold. I've asked her to see her primary   . Cardiac disease 03/10/2014  . Chronic diarrhea 07/27/2015  . Edema 07/27/2015  . Ejection fraction 2004   Normal, echo,   . Gastroesophageal reflux disease   . GERD (gastroesophageal reflux disease)   . Heart disease 03/10/2014  . Hypertension   . Left bundle branch block 09/01/2020  . Obesity   . Obesity (BMI 30-39.9) 09/25/2017  . OSA (obstructive sleep apnea)    mild with AHI 6.75 - on CPAP  . Preop cardiovascular exam 11/2010   Cardiac clearance for knee surgery   . Sleep apnea 03/10/2014  . Supraventricular tachycardia    Documented episode in the past, possibly reentrant tachycardia  Overview:  Overview:  Documented episode in the past, 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 It was possibly reentrant. She does well with diltiazem . No change in therapy.  . 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  . Thyroid  disease   . Urinary incontinence    Past Surgical History:  Procedure Laterality Date  . CATARACT EXTRACTION Left 8/11  . CATARACT EXTRACTION Right 3/12  . CHOLECYSTECTOMY  1994  . Copsilotomy Laser Treatment Left 6/12   eye  . ELBOW SURGERY  8/12  . 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 EYE SURGERY Right 12/11  . Lumbar Infusion  2011  . MOLE REMOVAL  06/17/2015  . 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 TOTAL KNEE ARTHROPLASTY Right 06/13/2011    Allergies  Allergen Reactions  . Ciprofloxacin  Other (See Comments)  Unknown   . Keflex [Cephalexin] Diarrhea  . Macrobid [Nitrofurantoin] Hives  . Amoxicillin Rash    Has since tolerated Zosyn  inpatient  .  Cefadroxil  Rash    Allergy not listed on [facility]     Allergies as of 03/18/2024       Reactions   Ciprofloxacin  Other (See Comments)   Unknown    Keflex [cephalexin] Diarrhea   Macrobid [nitrofurantoin] Hives   Amoxicillin Rash   Has since tolerated Zosyn  inpatient   Cefadroxil  Rash   Allergy not listed on [facility]         Medication List        Accurate as of March 18, 2024 11:59 PM. If you have any questions, ask your nurse or doctor.          acetaminophen  325 MG tablet Commonly known as: TYLENOL  Take 650 mg by mouth every 4 (four) hours as needed for fever, moderate pain (pain score 4-6) or mild pain (pain score 1-3).   albuterol  108 (90 Base) MCG/ACT inhaler Commonly known as: VENTOLIN  HFA Inhale 2 puffs into the lungs every 6 (six) hours as needed for wheezing or shortness of breath.   bifidobacterium infantis capsule Take 1 capsule by mouth daily.   docusate sodium  100 MG capsule Commonly known as: COLACE Take 100 mg by mouth daily as needed for mild constipation or moderate constipation.   furosemide  20 MG tablet Commonly known as: LASIX  Take 20 mg by mouth daily.   guaiFENesin -dextromethorphan 100-10 MG/5ML syrup Commonly known as: ROBITUSSIN DM Take 10 mLs by mouth every 4 (four) hours as needed for cough.   hydrocortisone  cream 1 % Apply 1 Application topically See admin instructions. Apply to urogenital area topically at bedtime every other day for itchy areas related to vaginitis.   ipratropium-albuterol  0.5-2.5 (3) MG/3ML Soln Commonly known as: DUONEB Inhale 3 mLs into the lungs every 8 (eight) hours. And every 8 hours as needed per [facility]   ketoconazole 2 % cream Commonly known as: NIZORAL Apply 1 Application topically 2 (two) times daily. Apply to peri area   ketotifen  0.035 % ophthalmic solution Commonly known as: ZADITOR  Place 1 drop into both eyes 2 (two) times daily.   loperamide 2 MG capsule Commonly known as:  IMODIUM Take 2 mg by mouth every 4 (four) hours as needed for diarrhea or loose stools.   nystatin powder Commonly known as: MYCOSTATIN/NYSTOP Apply 1 application  topically in the morning, at noon, in the evening, and at bedtime. To genital areas and inner thigh   ondansetron  4 MG tablet Commonly known as: ZOFRAN  Take 4 mg by mouth every 8 (eight) hours as needed for nausea or vomiting.   potassium chloride  10 MEQ tablet Commonly known as: KLOR-CON  M Take 30 mEq by mouth daily.   Protonix  40 MG tablet Generic drug: pantoprazole  Take 40 mg by mouth daily.   traMADol 50 MG tablet Commonly known as: ULTRAM Take 50 mg by mouth every 6 (six) hours as needed for moderate pain (pain score 4-6) or severe pain (pain score 7-10).   triamcinolone cream 0.5 % Commonly known as: KENALOG Apply 1 Application topically at bedtime. Apply to lower back skin folds rash   triamcinolone cream 0.1 % Commonly known as: KENALOG Apply 1 Application topically 2 (two) times daily. Apply to genital area        Review of Systems  Immunization History  Administered Date(s) Administered  . Fluzone Influenza virus vaccine,trivalent (IIV3), split virus 02/20/2012,  02/11/2013, 02/02/2016, 01/11/2017, 02/04/2019  . INFLUENZA, HIGH DOSE SEASONAL PF 02/04/2015, 01/11/2017, 01/30/2018, 02/05/2020, 01/25/2023  . Influenza Split 01/28/2008, 02/17/2009  . Influenza-Unspecified 02/16/2022, 01/30/2024  . Moderna Covid-19 Vaccine Bivalent Booster 71yrs & up 04/13/2023  . Moderna SARS-COV2 Booster Vaccination 02/24/2022  . Moderna Sars-Covid-2 Vaccination 04/29/2019, 05/09/2019, 05/27/2019, 03/03/2020, 09/04/2020, 11/04/2020  . PPD Test 02/05/2021, 02/19/2021, 07/14/2022, 10/19/2022  . Pfizer Covid-19 Vaccine Bivalent Booster 25yrs & up 02/09/2021  . Pneumococcal Conjugate-13 08/30/2013  . Pneumococcal Polysaccharide-23 11/03/2005  . RSV,unspecified 04/22/2022  . Td 08/01/2001, 09/05/2019  . Td (Adult)  08/01/2001  . Tdap 09/22/2010, 11/24/2020  . Unspecified SARS-COV-2 Vaccination 02/19/2024  . Zoster Recombinant(Shingrix) 11/08/2016, 01/11/2017  . Zoster, Live 11/04/2005, 11/08/2016, 01/11/2017  . Zoster, Unspecified 01/11/2017   Pertinent  Health Maintenance Due  Topic Date Due  . Influenza Vaccine  Completed  . Bone Density Scan  Completed      04/15/2022   10:15 AM 04/27/2022   11:35 AM 05/31/2022    9:34 AM 07/26/2022    9:31 AM 10/21/2022    9:31 AM  Fall Risk  Falls in the past year? 0 0 0 0 0  Was there an injury with Fall? 0 0 0 0 0  Fall Risk Category Calculator 0 0 0 0 0  Fall Risk Category (Retired) Low  Low      (RETIRED) Patient Fall Risk Level Moderate fall risk  Moderate fall risk      Patient at Risk for Falls Due to History of fall(s) History of fall(s) History of fall(s)  No Fall Risks  Fall risk Follow up Falls evaluation completed  Falls evaluation completed  Falls evaluation completed  Falls evaluation completed     Data saved with a previous flowsheet row definition   Functional Status Survey:    Vitals:   03/18/24 1533  BP: 110/66  Pulse: 64  Resp: 20  Temp: (!) 97 F (36.1 C)  SpO2: 95%  Weight: 294 lb 12.8 oz (133.7 kg)   Body mass index is 44.82 kg/m. Physical Exam  Labs reviewed: Recent Labs    12/15/23 0507 12/16/23 0650 12/17/23 0532 12/26/23 0000 12/29/23 0000 01/02/24 0000  NA 139 137 139 140 136* 135*  K 3.3* 3.6 3.6 4.7 4.6 4.5  CL 113* 112* 110 114* 109* 108  CO2 17* 15* 18* 18 15 19   GLUCOSE 73 70 63*  --   --   --   BUN 24* 25* 24* 48* 42* 38*  CREATININE 2.24* 2.45* 2.34* 2.3* 2.1* 2.3*  CALCIUM 8.7* 8.5* 8.5* 8.6* 8.7 8.4*  MG  --  2.1 2.0  --   --   --    Recent Labs    12/10/23 1822 12/16/23 0650 12/17/23 0532 12/29/23 0000 01/02/24 0000  AST 14* 11* 10* 13 10*  ALT 15 11 10 9 8   ALKPHOS 111 82 76 119 105  BILITOT 0.5 0.5 0.4  --   --   PROT 7.1 6.2* 5.8*  --   --   ALBUMIN 3.5 3.0* 2.7* 3.4* 2.6*    Recent Labs    12/15/23 0507 12/16/23 0650 12/17/23 0532 12/29/23 0000 01/02/24 0000  WBC 9.4 8.3 9.4 13.4 10.2  NEUTROABS  --  5.3 6.3  --  8,038.00  HGB 10.6* 10.9* 9.9* 11.1* 9.6*  HCT 36.6 37.6 32.8* 35* 31*  MCV 92.7 94.0 92.4  --   --   PLT 171 183 198 239 241   Lab Results  Component Value Date   TSH 5.752 (H) 12/16/2023   No results found for: HGBA1C No results found for: CHOL, HDL, LDLCALC, LDLDIRECT, TRIG, CHOLHDL  Significant Diagnostic Results in last 30 days:  No results found.  Assessment/Plan  Hypothyroidism TSH 5.752 12/16/23,  off Levothyroxine  per Hospice The patient is doing better with her eating, hydration, up in an electric scooter daily to visit her husband apartment in the stroller around facility, may consider resume levothyroxine  if her patient/H POA desire 03/19/24 resume Levothyroxine  100mcg every day.    Chronic heart failure with preserved ejection fraction (HFpEF) (HCC) taking Furosemide , BNP 167 01/02/24, DOE  GERD (gastroesophageal reflux disease)  stable, on  Pantoprazole , off  Omeprazole, prn Zofran .  Essential hypertension Blood pressure is controlled  on Furosemide , off Spironolactone .   Blood loss anemia post op, s/p EGD no active bleed. S/p 6 u PRBC transfusion, ASA was dc'd, off Fe, Vit B12,  Iron 61, Vit B12 216, Hgb 9.6 01/02/24, Hx of  Vit B12 1000mcg every day.   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 TDWB. Power wc. 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 Chest x-ray 02/26/2024 showed nonspecific perihilar  inflammatory changes, emphysema.               Failure to thrive in adult Stabilized Under Hospice service for supportive care   Family/ staff Communication: plan of care reviewed with the patient and charge nurse.   Labs/tests ordered:  none

## 2024-03-26 ENCOUNTER — Non-Acute Institutional Stay (SKILLED_NURSING_FACILITY): Payer: Self-pay | Admitting: Nurse Practitioner

## 2024-03-26 ENCOUNTER — Encounter: Payer: Self-pay | Admitting: Nurse Practitioner

## 2024-03-26 DIAGNOSIS — R627 Adult failure to thrive: Secondary | ICD-10-CM

## 2024-03-26 DIAGNOSIS — I10 Essential (primary) hypertension: Secondary | ICD-10-CM

## 2024-03-26 DIAGNOSIS — T8451XS Infection and inflammatory reaction due to internal right hip prosthesis, sequela: Secondary | ICD-10-CM

## 2024-03-26 DIAGNOSIS — D500 Iron deficiency anemia secondary to blood loss (chronic): Secondary | ICD-10-CM

## 2024-03-26 DIAGNOSIS — E039 Hypothyroidism, unspecified: Secondary | ICD-10-CM

## 2024-03-26 DIAGNOSIS — I5032 Chronic diastolic (congestive) heart failure: Secondary | ICD-10-CM

## 2024-03-26 DIAGNOSIS — J439 Emphysema, unspecified: Secondary | ICD-10-CM | POA: Diagnosis not present

## 2024-03-26 DIAGNOSIS — K2100 Gastro-esophageal reflux disease with esophagitis, without bleeding: Secondary | ICD-10-CM

## 2024-03-26 DIAGNOSIS — N184 Chronic kidney disease, stage 4 (severe): Secondary | ICD-10-CM

## 2024-03-26 NOTE — Assessment & Plan Note (Signed)
 on Furosemide , off Spironolactone .

## 2024-03-26 NOTE — Assessment & Plan Note (Signed)
 Failure to thrive, 01/01/24 Deconditioning, pt/husband-HPOA desires: MOST comfort measures, no ABT/IVF/hospitalization/feeding tube, Hospice service.

## 2024-03-26 NOTE — Assessment & Plan Note (Signed)
 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             Chest x-ray 02/26/2024 showed nonspecific perihilar inflammatory changes, emphysema.

## 2024-03-26 NOTE — Progress Notes (Unsigned)
 Location:  [facility] Room Number: N056-A Place of Service:  SNF (31) Provider:  Deundra Bard X, NP  Patient Care Team: Lorijean Husser X, NP as PCP - General (Internal Medicine) [name], MD as PCP - Cardiology (Cardiology) [name], MD as PCP - Sleep Medicine (Cardiology) [name], MD (Sports Medicine) [name], MD (Ophthalmology) [name], [name], MD (Dermatology) [name], MD (Gastroenterology) [name], [name], MD (Nephrology) [name], DC as Referring Physician (Chiropractic Medicine)  Extended Emergency Contact Information Primary Emergency Contact: Frogge,[name] Address: [address]          [place], [place] [address] [place] Home Phone: [phone] Mobile Phone: [phone] Relation: Spouse  Code Status:  DNR Goals of care: Advanced Directive information    03/26/2024    9:38 AM  Advanced Directives  Does Patient Have a Medical Advance Directive? Yes  Type of Advance Directive Living will;Out of facility DNR (pink MOST or yellow form)  Does patient want to make changes to medical advance directive? No - Patient declined  Pre-existing out of facility DNR order (yellow form or pink MOST form) Pink MOST form placed in chart (order not valid for inpatient use);Yellow form placed in chart (order not valid for inpatient use)     Chief Complaint  Patient presents with  . Medical Management of Chronic Issues    Routine Visit    HPI:  Pt is a 83 y.o. female seen today for medical management of chronic diseases.     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             Chest x-ray 02/26/2024 showed nonspecific perihilar inflammatory changes, emphysema.                          Failure to thrive, 01/01/24 Deconditioning, pt/husband-HPOA desires: MOST comfort measures, no ABT/IVF/hospitalization/feeding tube, Hospice service.                   lower  back pain, prn Tramadol and Tylenol  available to her, X-ray lumbar spine, sacrococcyx 06/08/23 negative fx.                           Hospitalized 07/08/22 for CHF, abscess of the right hip which previous infected, treated, open are in the right hip has chronic drainage, the prosthesis was removed and never replaced.               Hospitalized 07/02/22-07/07/22 for the right hip cellulitis, CT hip in hospital fluid collection unclear if it communicates with the joint space, Ortho not felt to be related to the joint space and may be an old fluid collection, aspiration not recommended. 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 TDWB. Power wc. 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 Fully treated, then off antibiotics. 11/17/22 ID no infection, no tx, healed.                           CKD stage 4, followed by  Nephrology, Bun/creat 38/2.27 01/02/24             Anemia, post op, s/p EGD no active bleed. S/p 6 u PRBC transfusion, ASA was dc'd, off Fe, Vit B12,  Iron 61, Vit B12 216, Hgb 9.6 01/02/24, Hx of  Vit B12 1000mcg every day.                          HTN on Furosemide , off Spironolactone .              Morbid obesity             OSA CPAP             Chronic diarrhea, prn Imodium, Align                          GERD, stable, on  Pantoprazole , off  Omeprazole, prn Zofran .             CHF/Edema, taking Furosemide , BNP 167 01/02/24               Hypothyroidism, TSH 5.752 12/16/23,  resumed Levothyroxine .                   Past Medical History:  Diagnosis Date  . Benign hypertension with chronic kidney disease, stage III (HCC)    Overview:  Last Assessment & Plan:  Usually the patient is hypertensive. However today her blood pressure is 105 systolic. She has been feeling fatigued with some  lightheadedness. Part of this may be related to her lower blood pressure. Her pressure may be improved with her decrease in salt and fluid intake. I've instructed her to put her lisinopril/hctz on hold. I've asked her to see her primary   . Cardiac disease 03/10/2014  . Chronic diarrhea 07/27/2015  . Edema 07/27/2015  . Ejection fraction 2004   Normal, echo,   . Gastroesophageal reflux disease   . GERD (gastroesophageal reflux disease)   . Heart disease 03/10/2014  . Hypertension   . Left bundle branch block 09/01/2020  . Obesity   . Obesity (BMI 30-39.9) 09/25/2017  . OSA (obstructive sleep apnea)    mild with AHI 6.75 - on CPAP  . Preop cardiovascular exam 11/2010   Cardiac clearance for knee surgery   . Sleep apnea 03/10/2014  . Supraventricular tachycardia    Documented episode in the past, possibly reentrant tachycardia  Overview:  Overview:  Documented episode in the past, 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 It was possibly reentrant. She does well with diltiazem . No change in therapy.  . 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  . Thyroid  disease   . Urinary incontinence    Past Surgical History:  Procedure Laterality Date  . CATARACT EXTRACTION Left 8/11  . CATARACT EXTRACTION Right 3/12  . CHOLECYSTECTOMY  1994  . Copsilotomy Laser Treatment Left 6/12   eye  . ELBOW SURGERY  8/12  . 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 EYE SURGERY Right 12/11  . Lumbar Infusion  2011  . MOLE REMOVAL  06/17/2015  . 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 TOTAL KNEE ARTHROPLASTY Right 06/13/2011    Allergies  Allergen Reactions  . Ciprofloxacin  Other (See Comments)    Unknown   . Keflex [  Cephalexin] Diarrhea  . Macrobid [Nitrofurantoin] Hives  . Amoxicillin Rash    Has since tolerated Zosyn  inpatient  .  Cefadroxil  Rash    Allergy not listed on [facility]     Outpatient Encounter Medications as of 03/26/2024  Medication Sig  . acetaminophen  (TYLENOL ) 325 MG tablet Take 650 mg by mouth every 4 (four) hours as needed for fever, moderate pain (pain score 4-6) or mild pain (pain score 1-3).  . albuterol  (VENTOLIN  HFA) 108 (90 Base) MCG/ACT inhaler Inhale 2 puffs into the lungs every 6 (six) hours as needed for wheezing or shortness of breath.  . bifidobacterium infantis (ALIGN) capsule Take 1 capsule by mouth daily.  . docusate sodium  (COLACE) 100 MG capsule Take 100 mg by mouth daily as needed for mild constipation or moderate constipation.  . furosemide  (LASIX ) 20 MG tablet Take 20 mg by mouth daily.  . guaiFENesin -dextromethorphan (ROBITUSSIN DM) 100-10 MG/5ML syrup Take 10 mLs by mouth every 4 (four) hours as needed for cough.  . hydrocortisone  cream 1 % Apply 1 Application topically See admin instructions. Apply to urogenital area topically at bedtime every other day for itchy areas related to vaginitis.  SABRA ipratropium-albuterol  (DUONEB) 0.5-2.5 (3) MG/3ML SOLN Inhale 3 mLs into the lungs every 8 (eight) hours. And every 8 hours as needed per [facility]  . ketoconazole (NIZORAL) 2 % cream Apply 1 Application topically 2 (two) times daily. Apply to peri area  . ketotifen  (ZADITOR ) 0.035 % ophthalmic solution Place 1 drop into both eyes 2 (two) times daily.  . levothyroxine  (SYNTHROID ) 100 MCG tablet Take 100 mcg by mouth daily.  SABRA loperamide (IMODIUM) 2 MG capsule Take 2 mg by mouth every 4 (four) hours as needed for diarrhea or loose stools.  . nystatin (MYCOSTATIN/NYSTOP) powder Apply 1 application  topically in the morning, at noon, in the evening, and at bedtime. To genital areas and inner thigh  . ondansetron  (ZOFRAN ) 4 MG tablet Take 4 mg by mouth every 8 (eight) hours as needed for nausea or vomiting.  . pantoprazole  (PROTONIX ) 40 MG tablet Take 40 mg by mouth daily.  . potassium  chloride (KLOR-CON  M) 10 MEQ tablet Take 30 mEq by mouth daily.  . traMADol (ULTRAM) 50 MG tablet Take 50 mg by mouth every 6 (six) hours as needed for moderate pain (pain score 4-6) or severe pain (pain score 7-10).  . TRELEGY ELLIPTA 100-62.5-25 MCG/ACT AEPB Inhale 1 puff into the lungs daily.  SABRA triamcinolone cream (KENALOG) 0.1 % Apply 1 Application topically 2 (two) times daily. Apply to genital area  . triamcinolone cream (KENALOG) 0.5 % Apply 1 Application topically at bedtime. Apply to lower back skin folds rash   No facility-administered encounter medications on file as of 03/26/2024.    Review of Systems  Immunization History  Administered Date(s) Administered  . Fluzone Influenza virus vaccine,trivalent (IIV3), split virus 02/20/2012, 02/11/2013, 02/02/2016, 01/11/2017, 02/04/2019  . INFLUENZA, HIGH DOSE SEASONAL PF 02/04/2015, 01/11/2017, 01/30/2018, 02/05/2020, 01/25/2023  . Influenza Split 01/28/2008, 02/17/2009  . Influenza-Unspecified 02/16/2022, 01/30/2024  . Moderna Covid-19 Vaccine Bivalent Booster 37yrs & up 04/13/2023  . Moderna SARS-COV2 Booster Vaccination 02/24/2022  . Moderna Sars-Covid-2 Vaccination 04/29/2019, 05/09/2019, 05/27/2019, 03/03/2020, 09/04/2020, 11/04/2020  . PPD Test 02/05/2021, 02/19/2021, 07/14/2022, 10/19/2022  . Pfizer Covid-19 Vaccine Bivalent Booster 17yrs & up 02/09/2021  . Pneumococcal Conjugate-13 08/30/2013  . Pneumococcal Polysaccharide-23 11/03/2005  . RSV,unspecified 04/22/2022  . Td 08/01/2001, 09/05/2019  . Td (Adult) 08/01/2001  . Tdap 09/22/2010,  11/24/2020  . Unspecified SARS-COV-2 Vaccination 02/19/2024  . Zoster Recombinant(Shingrix) 11/08/2016, 01/11/2017  . Zoster, Live 11/04/2005, 11/08/2016, 01/11/2017  . Zoster, Unspecified 01/11/2017   Pertinent  Health Maintenance Due  Topic Date Due  . Influenza Vaccine  Completed  . Bone Density Scan  Completed      04/15/2022   10:15 AM 04/27/2022   11:35 AM 05/31/2022    9:34  AM 07/26/2022    9:31 AM 10/21/2022    9:31 AM  Fall Risk  Falls in the past year? 0 0 0 0 0  Was there an injury with Fall? 0  0  0  0  0   Fall Risk Category Calculator 0 0 0 0 0  Fall Risk Category (Retired) Low  Low      (RETIRED) Patient Fall Risk Level Moderate fall risk  Moderate fall risk      Patient at Risk for Falls Due to History of fall(s) History of fall(s) History of fall(s)  No Fall Risks  Fall risk Follow up Falls evaluation completed  Falls evaluation completed  Falls evaluation completed  Falls evaluation completed     Data saved with a previous flowsheet row definition   Functional Status Survey:    Vitals:   03/26/24 0946  BP: (!) 141/73  Pulse: 61  Resp: 18  Temp: (!) 97 F (36.1 C)  SpO2: 90%  Weight: 295 lb 6.4 oz (134 kg)  Height: 5' 8 (1.727 m)   Body mass index is 44.92 kg/m. Physical Exam Vitals and nursing note reviewed.  Constitutional:      Appearance: Normal appearance. She is obese.  HENT:     Head: Normocephalic and atraumatic.     Nose: Nose normal.  Eyes:     Extraocular Movements: Extraocular movements intact.     Conjunctiva/sclera: Conjunctivae normal.     Pupils: Pupils are equal, round, and reactive to light.  Cardiovascular:     Rate and Rhythm: Normal rate and regular rhythm.     Heart sounds: No murmur heard. Pulmonary:     Breath sounds: No wheezing, rhonchi or rales.  Abdominal:     General: Bowel sounds are normal.     Palpations: Abdomen is soft.     Tenderness: There is no abdominal tenderness.  Musculoskeletal:        General: Tenderness present.     Cervical back: Normal range of motion and neck supple.     Right lower leg: Edema present.     Left lower leg: Edema present.     Comments: R hip, prosthetic hip removed. Dependent edema moderate. Lower back pain i no spine ns positional, no spinous process tenderness palpated, pain is positional  Skin:    General: Skin is warm and dry.     Findings: Erythema  present.     Comments: Edema mostly in the dependent areas, buttock, thighs, legs.  More erythema, swelling, mild warmth in the right hip/lateral thigh area, a small open tunneling wound R hip with serous drainage.   Neurological:     General: No focal deficit present.     Mental Status: She is alert and oriented to person, place, and time.     Gait: Gait abnormal.  Psychiatric:        Mood and Affect: Mood normal.        Behavior: Behavior normal.        Thought Content: Thought content normal.        Judgment: Judgment  normal.     Labs reviewed: Recent Labs    12/15/23 0507 12/16/23 0650 12/17/23 0532 12/26/23 0000 12/29/23 0000 01/02/24 0000  NA 139 137 139 140 136* 135*  K 3.3* 3.6 3.6 4.7 4.6 4.5  CL 113* 112* 110 114* 109* 108  CO2 17* 15* 18* 18 15 19   GLUCOSE 73 70 63*  --   --   --   BUN 24* 25* 24* 48* 42* 38*  CREATININE 2.24* 2.45* 2.34* 2.3* 2.1* 2.3*  CALCIUM 8.7* 8.5* 8.5* 8.6* 8.7 8.4*  MG  --  2.1 2.0  --   --   --    Recent Labs    12/10/23 1822 12/16/23 0650 12/17/23 0532 12/29/23 0000 01/02/24 0000  AST 14* 11* 10* 13 10*  ALT 15 11 10 9 8   ALKPHOS 111 82 76 119 105  BILITOT 0.5 0.5 0.4  --   --   PROT 7.1 6.2* 5.8*  --   --   ALBUMIN 3.5 3.0* 2.7* 3.4* 2.6*   Recent Labs    12/15/23 0507 12/16/23 0650 12/17/23 0532 12/29/23 0000 01/02/24 0000  WBC 9.4 8.3 9.4 13.4 10.2  NEUTROABS  --  5.3 6.3  --  8,038.00  HGB 10.6* 10.9* 9.9* 11.1* 9.6*  HCT 36.6 37.6 32.8* 35* 31*  MCV 92.7 94.0 92.4  --   --   PLT 171 183 198 239 241   Lab Results  Component Value Date   TSH 5.752 (H) 12/16/2023   No results found for: HGBA1C No results found for: CHOL, HDL, LDLCALC, LDLDIRECT, TRIG, CHOLHDL  Significant Diagnostic Results in last 30 days:  No results found.  Assessment/Plan Infected prosthesis of right hip  Hx of abscess of the right hip which previous infected, treated, open are in the right hip has chronic drainage,  the prosthesis was removed and never replaced.  Reported increased amount of drainage, not purulent  Adult failure to thrive Failure to thrive, 01/01/24 Deconditioning, pt/husband-HPOA desires: MOST comfort measures, no ABT/IVF/hospitalization/feeding tube, Hospice service.       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             Chest x-ray 02/26/2024 showed nonspecific perihilar inflammatory changes, emphysema.  Chronic heart failure with preserved ejection fraction (HFpEF) (HCC) CHF/Edema, taking Furosemide , BNP 167 01/02/24    GERD (gastroesophageal reflux disease) stable, on  Pantoprazole , off  Omeprazole, prn Zofran .  Hypothyroidism TSH 5.752 12/16/23,  resumed Levothyroxine .  Essential hypertension  on Furosemide , off Spironolactone .   Blood loss anemia post op, s/p EGD no active bleed. S/p 6 u PRBC transfusion, ASA was dc'd, off Fe, Vit B12,  Iron 61, Vit B12 216, Hgb 9.6 01/02/24, Hx of  Vit B12 1000mcg every day.    Family/ staff Communication: Plan of care reviewed with the patient and charge nurse  Labs/tests ordered: None

## 2024-03-26 NOTE — Assessment & Plan Note (Signed)
 followed by Nephrology, Bun/creat 38/2.27 01/02/24

## 2024-03-26 NOTE — Assessment & Plan Note (Signed)
 stable, on  Pantoprazole, off  Omeprazole, prn Zofran.

## 2024-03-26 NOTE — Assessment & Plan Note (Signed)
 post op, s/p EGD no active bleed. S/p 6 u PRBC transfusion, ASA was dc'd, off Fe, Vit B12,  Iron 61, Vit B12 216, Hgb 9.6 01/02/24, Hx of  Vit B12 1000mcg every day.

## 2024-03-26 NOTE — Assessment & Plan Note (Signed)
 CHF/Edema, taking Furosemide , BNP 167 01/02/24

## 2024-03-26 NOTE — Assessment & Plan Note (Signed)
 TSH 5.752 12/16/23,  resumed Levothyroxine .

## 2024-03-26 NOTE — Assessment & Plan Note (Signed)
 Hx of abscess of the right hip which previous infected, treated, open are in the right hip has chronic drainage, the prosthesis was removed and never replaced.  Reported increased amount of drainage, not purulent

## 2024-04-08 ENCOUNTER — Other Ambulatory Visit: Payer: Self-pay | Admitting: Nurse Practitioner

## 2024-04-08 ENCOUNTER — Encounter: Payer: Self-pay | Admitting: Nurse Practitioner

## 2024-04-08 MED ORDER — MORPHINE SULFATE (CONCENTRATE) 5 MG/0.25ML PO SOLN: 5.0000 mg | ORAL | 0 refills | Status: AC | PRN

## 2024-05-07 LAB — BASIC METABOLIC PANEL WITH GFR
BUN: 32 — AB (ref 4–21)
CO2: 17 (ref 13–22)
Chloride: 99 (ref 99–108)
Creatinine: 2.3 — AB (ref 0.5–1.1)
Glucose: 69
Potassium: 5.8 meq/L — AB (ref 3.5–5.1)
Sodium: 125 — AB (ref 137–147)

## 2024-05-07 LAB — COMPREHENSIVE METABOLIC PANEL WITH GFR
Calcium: 8.6 — AB (ref 8.7–10.7)
eGFR: 21

## 2024-05-09 ENCOUNTER — Other Ambulatory Visit: Payer: Self-pay | Admitting: Nurse Practitioner

## 2024-05-09 ENCOUNTER — Non-Acute Institutional Stay (SKILLED_NURSING_FACILITY): Payer: Self-pay | Admitting: Nurse Practitioner

## 2024-05-09 ENCOUNTER — Encounter: Payer: Self-pay | Admitting: Nurse Practitioner

## 2024-05-09 DIAGNOSIS — R0902 Hypoxemia: Secondary | ICD-10-CM | POA: Diagnosis not present

## 2024-05-09 DIAGNOSIS — Z66 Do not resuscitate: Secondary | ICD-10-CM

## 2024-05-09 DIAGNOSIS — K2100 Gastro-esophageal reflux disease with esophagitis, without bleeding: Secondary | ICD-10-CM | POA: Diagnosis not present

## 2024-05-09 DIAGNOSIS — R627 Adult failure to thrive: Secondary | ICD-10-CM

## 2024-05-09 DIAGNOSIS — I5033 Acute on chronic diastolic (congestive) heart failure: Secondary | ICD-10-CM | POA: Diagnosis not present

## 2024-05-09 DIAGNOSIS — F419 Anxiety disorder, unspecified: Secondary | ICD-10-CM

## 2024-05-09 DIAGNOSIS — E871 Hypo-osmolality and hyponatremia: Secondary | ICD-10-CM

## 2024-05-09 MED ORDER — LORAZEPAM 1 MG PO TABS: 1.0000 mg | ORAL_TABLET | Freq: Four times a day (QID) | ORAL | 0 refills | Status: AC | PRN

## 2024-05-09 NOTE — Assessment & Plan Note (Signed)
 O2 2 L/min via nasal cannula for hypoxia, diuresis her should help

## 2024-05-09 NOTE — Assessment & Plan Note (Signed)
 Continue hospice service, comfort measures, will have as needed morphine  and lorazepam  available to her, simplified medication: Continue Protonix  and discontinued them rest soft p.o. meds except morphine  and lorazepam 

## 2024-05-09 NOTE — Assessment & Plan Note (Signed)
 Continue pantoprazole 

## 2024-05-09 NOTE — Assessment & Plan Note (Signed)
 Na 125 05/07/2024 Increased furosemide  might help

## 2024-05-09 NOTE — Assessment & Plan Note (Signed)
 CHF fluid overloaded, needs increase Furosemide  to help the patient's SOB Increase furosemide  to 40 mg twice daily

## 2024-05-09 NOTE — Progress Notes (Signed)
 " Location:  [facility] Room Number: [phone] A Place of Service:  SNF (31) Provider:  Kellye Mizner X, NP  Patient Care Team: Randalyn Ahmed X, NP as PCP - General (Internal Medicine) [name], MD as PCP - Cardiology (Cardiology) [name], MD as PCP - Sleep Medicine (Cardiology) [name], MD (Sports Medicine) [name], MD (Ophthalmology) [name], [name], MD (Dermatology) [name], MD (Gastroenterology) [name], [name], MD (Nephrology) [name], DC as Referring Physician (Chiropractic Medicine)  Extended Emergency Contact Information Primary Emergency Contact: Mcglothen,[name] Address: [address]          [place], [place] [address] [place] Home Phone: [phone] Mobile Phone: [phone] Relation: Spouse  Code Status:  DNR Goals of care: Advanced Directive information    03/26/2024    9:38 AM  Advanced Directives  Does Patient Have a Medical Advance Directive? Yes  Type of Advance Directive Living will;Out of facility DNR (pink MOST or yellow form)  Does patient want to make changes to medical advance directive? No - Patient declined  Pre-existing out of facility DNR order (yellow form or pink MOST form) Pink MOST form placed in chart (order not valid for inpatient use);Yellow form placed in chart (order not valid for inpatient use)     Chief Complaint  Patient presents with   Hyponatremia    HPI:  Pt is a 84 y.o. female seen today for an acute visit for hyponatremia, shortness of breath, fluid overload, pain, anxiety  The patient was just seen in the bed, repetitively saying give me something to calm me down, vomited with the ingested food.  O2 2 L/min via nasal cannula for hypoxia, noted severe erythematous from the waist down, moist rales in lungs  CHF fluid overloaded, needs increase Furosemide  to help the patient's SOB  05/07/2024 NA 125, potassium 5.8, BUN 32, creatinine  2.3.  Urine culture culture showed mixed genital flora  Adult failure to thrive, under Hospice service, comfort measures.   GERD, taking Pantoprazole      Past Medical History:  Diagnosis Date   Benign hypertension with chronic kidney disease, stage III (HCC)    Overview:  Last Assessment & Plan:  Usually the patient is hypertensive. However today her blood pressure is 105 systolic. She has been feeling fatigued with some lightheadedness. Part of this may be related to her lower blood pressure. Her pressure may be improved with her decrease in salt and fluid intake. I've instructed her to put her lisinopril/hctz on hold. 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 It was possibly reentrant. She does well with  diltiazem . No change in therapy.   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   albuterol  (VENTOLIN  HFA) 108 (90 Base) MCG/ACT inhaler Inhale 2 puffs into the lungs every 6 (six) hours as needed for wheezing or shortness of breath.   bifidobacterium infantis (ALIGN) capsule Take 1 capsule by mouth daily.   docusate sodium  (COLACE) 100 MG capsule Take 100 mg by mouth daily as needed for mild constipation or moderate constipation.   furosemide  (LASIX ) 20 MG tablet Take 20 mg by mouth daily.   guaiFENesin -dextromethorphan (ROBITUSSIN DM) 100-10 MG/5ML syrup Take 10 mLs by mouth every 4 (four) hours as needed for cough.   hydrocortisone  cream 1 % Apply 1 Application topically See admin instructions. Apply to urogenital area topically at bedtime every other day for itchy areas related to vaginitis.   ipratropium-albuterol  (DUONEB) 0.5-2.5 (3) MG/3ML SOLN Inhale 3 mLs into the lungs every 8 (eight) hours. And every 8 hours as needed per [facility]   ketoconazole (NIZORAL) 2 % cream Apply 1 Application topically 2 (two) times daily. Apply to peri area   ketotifen  (ZADITOR ) 0.035 % ophthalmic solution Place 1 drop into both eyes 2 (two) times daily.   levothyroxine  (SYNTHROID ) 100 MCG tablet Take 100 mcg by mouth daily.   loperamide (IMODIUM) 2 MG capsule Take 2 mg by mouth every 4 (four) hours as needed for diarrhea or loose stools.   methocarbamol  (ROBAXIN ) 500 MG tablet Take 500 mg by mouth every 6 (six) hours as needed for muscle spasms.   morphine  20 MG/5ML solution give 0.25 ml by mouth every 4 hours as needed for Pain - Severe   nystatin  (MYCOSTATIN/NYSTOP) powder Apply 1 application  topically in the morning, at noon, in the evening, and at bedtime. To genital areas and inner thigh   ondansetron  (ZOFRAN ) 4 MG tablet Take 4 mg by mouth every 8 (eight) hours as needed for nausea or vomiting.   pantoprazole  (PROTONIX ) 40 MG tablet Take 40 mg by mouth daily.   potassium chloride  (KLOR-CON  M) 10 MEQ tablet Take 30 mEq by mouth daily.   traMADol (ULTRAM) 50 MG tablet Take 50 mg by mouth every 4 (four) hours as needed for moderate pain (pain score 4-6) or severe pain (pain score 7-10).   TRELEGY ELLIPTA 100-62.5-25 MCG/ACT AEPB Inhale 1 puff into the lungs daily.   triamcinolone cream (KENALOG) 0.1 % Apply 1 Application topically 2 (two) times daily. Apply to genital area   triamcinolone cream (KENALOG) 0.5 % Apply 1 Application topically at bedtime. Apply to lower back skin folds rash   No facility-administered encounter medications on file as of 05/09/2024.  Review of Systems  Constitutional:  Positive for appetite change and fatigue. Negative for fever.  HENT:  Negative for congestion, sore throat and trouble swallowing.   Eyes:  Negative for visual disturbance.  Respiratory:  Positive for cough and wheezing. Negative for shortness of breath.   Cardiovascular:  Positive for leg swelling.  Gastrointestinal:  Positive for vomiting. Negative for constipation.  Genitourinary:  Positive for frequency. Negative for dysuria and urgency.  Musculoskeletal:  Positive for arthralgias, back pain and gait problem.       Mid to lower back pain, worsened, positional Lower back pain  Skin:  Positive for wound. Negative for color change.  Neurological:  Negative for speech difficulty and weakness.  Psychiatric/Behavioral:  The patient is nervous/anxious.     Immunization History  Administered Date(s) Administered   Fluzone Influenza virus vaccine,trivalent (IIV3), split virus 02/20/2012, 02/11/2013, 02/02/2016, 01/11/2017, 02/04/2019    INFLUENZA, HIGH DOSE SEASONAL PF 02/04/2015, 01/11/2017, 01/30/2018, 02/05/2020, 01/25/2023   Influenza Split 01/28/2008, 02/17/2009   Influenza-Unspecified 02/16/2022, 01/30/2024   Moderna Covid-19 Vaccine Bivalent Booster 51yrs & up 04/13/2023   Moderna SARS-COV2 Booster Vaccination 02/24/2022   Moderna Sars-Covid-2 Vaccination 04/29/2019, 05/09/2019, 05/27/2019, 03/03/2020, 09/04/2020, 11/04/2020   PPD Test 02/05/2021, 02/19/2021, 07/14/2022, 10/19/2022   Pfizer Covid-19 Vaccine Bivalent Booster 70yrs & up 02/09/2021   Pneumococcal Conjugate-13 08/30/2013   Pneumococcal Polysaccharide-23 11/03/2005   RSV,unspecified 04/22/2022   Td 08/01/2001, 09/05/2019   Td (Adult) 08/01/2001   Tdap 09/22/2010, 11/24/2020   Unspecified SARS-COV-2 Vaccination 02/19/2024   Zoster Recombinant(Shingrix) 11/08/2016, 01/11/2017   Zoster, Live 11/04/2005, 11/08/2016, 01/11/2017   Zoster, Unspecified 01/11/2017   Pertinent  Health Maintenance Due  Topic Date Due   Influenza Vaccine  Completed   Bone Density Scan  Completed      04/15/2022   10:15 AM 04/27/2022   11:35 AM 05/31/2022    9:34 AM 07/26/2022    9:31 AM 10/21/2022    9:31 AM  Fall Risk  Falls in the past year? 0 0 0 0 0  Was there an injury with Fall? 0  0  0  0  0   Fall Risk Category Calculator 0 0 0 0 0  Fall Risk Category (Retired) Low  Low      (RETIRED) Patient Fall Risk Level Moderate fall risk  Moderate fall risk      Patient at Risk for Falls Due to History of fall(s) History of fall(s) History of fall(s)  No Fall Risks  Fall risk Follow up Falls evaluation completed  Falls evaluation completed  Falls evaluation completed  Falls evaluation completed     Data saved with a previous flowsheet row definition   Functional Status Survey:    Vitals:   05/09/24 1045  BP: 120/72  Pulse: 74  Temp: 97.6 F (36.4 C)  SpO2: 98%  Weight: 295 lb 6.4 oz (134 kg)  Height: 5' 8 (1.727 m)   Body mass index is 44.92 kg/m. Physical  Exam Vitals and nursing note reviewed.  Constitutional:      Appearance: She is obese.     Comments: Anxious, repetitive: Gave me something to calm me down  HENT:     Head: Normocephalic and atraumatic.     Nose: Nose normal.  Eyes:     Extraocular Movements: Extraocular movements intact.     Conjunctiva/sclera: Conjunctivae normal.     Pupils: Pupils are equal, round, and reactive to light.  Cardiovascular:     Rate and Rhythm: Normal rate and  regular rhythm.     Heart sounds: No murmur heard. Pulmonary:     Breath sounds: No wheezing, rhonchi or rales.  Abdominal:     General: Bowel sounds are normal.     Palpations: Abdomen is soft.     Tenderness: There is no abdominal tenderness.  Musculoskeletal:        General: Tenderness present.     Cervical back: Normal range of motion and neck supple.     Right lower leg: Edema present.     Left lower leg: Edema present.     Comments: R hip, prosthetic hip removed. Lower back pain i no spine ns positional, no spinous process tenderness palpated, pain is positional Severe edematous from waist down  Skin:    General: Skin is warm and dry.     Findings: Erythema present.     Comments: Edema mostly in the dependent areas, buttock, thighs, legs.  More erythema, swelling, mild warmth in the right hip/lateral thigh area, a small open tunneling wound R hip with purulent drainage.  MOST no ABT/IVF  Neurological:     General: No focal deficit present.     Mental Status: She is alert and oriented to person, place, and time.     Gait: Gait abnormal.  Psychiatric:     Comments: Anxious     Labs reviewed: Recent Labs    12/15/23 0507 12/16/23 0650 12/17/23 0532 12/26/23 0000 12/29/23 0000 01/02/24 0000 05/07/24 0000  NA 139 137 139   < > 136* 135* 125*  K 3.3* 3.6 3.6   < > 4.6 4.5 5.8*  CL 113* 112* 110   < > 109* 108 99  CO2 17* 15* 18*   < > 15 19 17   GLUCOSE 73 70 63*  --   --   --   --   BUN 24* 25* 24*   < > 42* 38* 32*   CREATININE 2.24* 2.45* 2.34*   < > 2.1* 2.3* 2.3*  CALCIUM 8.7* 8.5* 8.5*   < > 8.7 8.4* 8.6*  MG  --  2.1 2.0  --   --   --   --    < > = values in this interval not displayed.   Recent Labs    12/10/23 1822 12/16/23 0650 12/17/23 0532 12/29/23 0000 01/02/24 0000  AST 14* 11* 10* 13 10*  ALT 15 11 10 9 8   ALKPHOS 111 82 76 119 105  BILITOT 0.5 0.5 0.4  --   --   PROT 7.1 6.2* 5.8*  --   --   ALBUMIN 3.5 3.0* 2.7* 3.4* 2.6*   Recent Labs    12/15/23 0507 12/16/23 0650 12/17/23 0532 12/29/23 0000 01/02/24 0000  WBC 9.4 8.3 9.4 13.4 10.2  NEUTROABS  --  5.3 6.3  --  8,038.00  HGB 10.6* 10.9* 9.9* 11.1* 9.6*  HCT 36.6 37.6 32.8* 35* 31*  MCV 92.7 94.0 92.4  --   --   PLT 171 183 198 239 241   Lab Results  Component Value Date   TSH 5.752 (H) 12/16/2023   No results found for: HGBA1C No results found for: CHOL, HDL, LDLCALC, LDLDIRECT, TRIG, CHOLHDL  Significant Diagnostic Results in last 30 days:  No results found.  Assessment/Plan Adult failure to thrive Continue hospice service, comfort measures, will have as needed morphine  and lorazepam  available to her, simplified medication: Continue Protonix  and discontinued them rest soft p.o. meds except morphine  and lorazepam   Acute on chronic  diastolic CHF (congestive heart failure) (HCC) CHF fluid overloaded, needs increase Furosemide  to help the patient's SOB Increase furosemide  to 40 mg twice daily  Hypoxia O2 2 L/min via nasal cannula for hypoxia, diuresis her should help  Anxiety Will have lorazepam  available to her  GERD (gastroesophageal reflux disease) Continue pantoprazole   Hyponatremia Na 125 05/07/2024 Increased furosemide  might help     Family/ staff Communication: Plan of care reviewed with the patient, patient's H POA-husband, hospice nurse, and charge nurse  Labs/tests ordered: 05/07/2024 BMP, urine culture done      [1]  Allergies Allergen Reactions   Ciprofloxacin   Other (See Comments)    Unknown    Keflex [Cephalexin] Diarrhea   Macrobid [Nitrofurantoin] Hives   Amoxicillin Rash    Has since tolerated Zosyn  inpatient   Cefadroxil  Rash    Allergy not listed on MAR    "

## 2024-05-09 NOTE — Assessment & Plan Note (Signed)
 Will have lorazepam  available to her

## 2024-05-17 ENCOUNTER — Non-Acute Institutional Stay (SKILLED_NURSING_FACILITY): Payer: Self-pay | Admitting: Family Medicine

## 2024-05-17 DIAGNOSIS — R627 Adult failure to thrive: Secondary | ICD-10-CM

## 2024-05-17 DIAGNOSIS — T148XXA Other injury of unspecified body region, initial encounter: Secondary | ICD-10-CM | POA: Diagnosis not present

## 2024-05-17 DIAGNOSIS — N184 Chronic kidney disease, stage 4 (severe): Secondary | ICD-10-CM

## 2024-05-17 DIAGNOSIS — E66813 Obesity, class 3: Secondary | ICD-10-CM | POA: Diagnosis not present

## 2024-05-17 DIAGNOSIS — E039 Hypothyroidism, unspecified: Secondary | ICD-10-CM | POA: Diagnosis not present

## 2024-05-17 DIAGNOSIS — R269 Unspecified abnormalities of gait and mobility: Secondary | ICD-10-CM

## 2024-05-17 NOTE — Progress Notes (Signed)
 " Provider:  Garnette Pinal, MD Location:      Place of Service:     PCP: Mast, Man X, NP Patient Care Team: Mast, Man X, NP as PCP - General (Internal Medicine) [name], MD as PCP - Cardiology (Cardiology) [name], MD as PCP - Sleep Medicine (Cardiology) [name], MD (Sports Medicine) [name], MD (Ophthalmology) [name], [name], MD (Dermatology) [name], MD (Gastroenterology) [name], [name], MD (Nephrology) [name], DC as Referring Physician (Chiropractic Medicine)  Extended Emergency Contact Information Primary Emergency Contact: Frost,[name] Address: [address]          [place], [place] [address] [place] Home Phone: [phone] Mobile Phone: [phone] Relation: Spouse  Code Status:  Goals of Care: Advanced Directive information    03/26/2024    9:38 AM  Advanced Directives  Does Patient Have a Medical Advance Directive? Yes  Type of Advance Directive Living will;Out of facility DNR (pink MOST or yellow form)  Does patient want to make changes to medical advance directive? No - Patient declined  Pre-existing out of facility DNR order (yellow form or pink MOST form) Pink MOST form placed in chart (order not valid for inpatient use);Yellow form placed in chart (order not valid for inpatient use)     HPI: Patient is a 84 y.o. female seen today for medical management of chronic problems including: morbid obesity, absent r hip with intermittent infection, COPD. CKD. Symptoms recently  include SOB, back pain, and fluid retention. Pt under hospice care. Decision made with husband to focus on three areas of care: edema(on furosemide ), back pain (on Morphine , and breathing(on inhalers and Morphine ).   Pt is non-ambulatory without hip and gets around in motorized scooter.  Past Medical History:  Diagnosis Date   Benign hypertension with chronic kidney disease, stage III (HCC)     Overview:  Last Assessment & Plan:  Usually the patient is hypertensive. However today her blood pressure is 105 systolic. She has been feeling fatigued with some lightheadedness. Part of this may be related to her lower blood pressure. Her pressure may be improved with her decrease in salt and fluid intake. I've instructed her to put her lisinopril/hctz on hold. 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 It was possibly reentrant. She does well with diltiazem . No change in therapy.   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   Thyroid  disease    Urinary incontinence    Past Surgical History:  Procedure Laterality Date   CATARACT EXTRACTION Left 8/11   CATARACT EXTRACTION Right 3/12  CHOLECYSTECTOMY  1994   Copsilotomy Laser Treatment Left 6/12   eye   ELBOW SURGERY  8/12   EYE SURGERY Left 09/2008   macular hole   EYE SURGERY Right 12/11   Lumbar Infusion  2011   MOLE REMOVAL  06/17/2015   TOTAL KNEE ARTHROPLASTY Left 6/11   TOTAL KNEE ARTHROPLASTY Right 06/13/2011    reports that she has quit smoking. She has never been exposed to tobacco smoke. She has never  used smokeless tobacco. She reports that she does not drink alcohol  and does not use drugs. Social History   Socioeconomic History   Marital status: Married    Spouse name: Not on file   Number of children: Not on file   Years of education: Not on file   Highest education level: Not on file  Occupational History   Not on file  Tobacco Use   Smoking status: Former    Passive exposure: Never   Smokeless tobacco: Never  Vaping Use   Vaping status: Never Used  Substance and Sexual Activity   Alcohol  use: No   Drug use: No   Sexual activity: Not Currently  Other Topics Concern   Not on file  Social History Narrative   Not on file   Social Drivers of Health   Tobacco Use: Medium Risk (05/09/2024)   Patient History    Smoking Tobacco Use: Former    Smokeless Tobacco Use: Never    Passive Exposure: Never  Physicist, Medical Strain: Not on file  Food Insecurity: No Food Insecurity (12/11/2023)   Epic    Worried About Programme Researcher, Broadcasting/film/video in the Last Year: Never true    Ran Out of Food in the Last Year: Never true  Transportation Needs: No Transportation Needs (12/11/2023)   Epic    Lack of Transportation (Medical): No    Lack of Transportation (Non-Medical): No  Physical Activity: Not on file  Stress: Not on file  Social Connections: Socially Isolated (12/11/2023)   Social Connection and Isolation Panel    Frequency of Communication with Friends and Family: Once a week    Frequency of Social Gatherings with Friends and Family: Once a week    Attends Religious Services: Never    Database Administrator or Organizations: No    Attends Banker Meetings: Never    Marital Status: Married  Catering Manager Violence: Not At Risk (12/11/2023)   Epic    Fear of Current or Ex-Partner: No    Emotionally Abused: No    Physically Abused: No    Sexually Abused: No  Depression (PHQ2-9): Low Risk (02/21/2023)   Depression (PHQ2-9)    PHQ-2 Score: 0  Alcohol  Screen: Not on  file  Housing: Low Risk (12/11/2023)   Epic    Unable to Pay for Housing in the Last Year: No    Number of Times Moved in the Last Year: 0    Homeless in the Last Year: No  Utilities: Not At Risk (12/11/2023)   Epic    Threatened with loss of utilities: No  Health Literacy: Not on file    Functional Status Survey:    Family History  Problem Relation Age of Onset   High blood pressure Mother    Diabetes Mother    Heart Problems Father    Diabetes Father    Prostate cancer Father    Heart attack Father    Heart attack Paternal Grandmother    Heart attack Maternal Grandfather  Heart attack Paternal Uncle    Stroke Neg Hx     Health Maintenance  Topic Date Due   COVID-19 Vaccine (9 - Moderna risk 2025-26 season) 08/19/2024   DTaP/Tdap/Td (6 - Td or Tdap) 11/25/2030   Pneumococcal Vaccine: 50+ Years  Completed   Influenza Vaccine  Completed   Bone Density Scan  Completed   Zoster Vaccines- Shingrix  Completed   Meningococcal B Vaccine  Aged Out    Allergies[1]  Outpatient Encounter Medications as of 05/17/2024  Medication Sig   acetaminophen  (TYLENOL ) 325 MG tablet Take 650 mg by mouth every 4 (four) hours as needed for fever, moderate pain (pain score 4-6) or mild pain (pain score 1-3).   albuterol  (VENTOLIN  HFA) 108 (90 Base) MCG/ACT inhaler Inhale 2 puffs into the lungs every 6 (six) hours as needed for wheezing or shortness of breath.   bifidobacterium infantis (ALIGN) capsule Take 1 capsule by mouth daily.   docusate sodium  (COLACE) 100 MG capsule Take 100 mg by mouth daily as needed for mild constipation or moderate constipation.   furosemide  (LASIX ) 20 MG tablet Take 20 mg by mouth daily.   guaiFENesin -dextromethorphan (ROBITUSSIN DM) 100-10 MG/5ML syrup Take 10 mLs by mouth every 4 (four) hours as needed for cough.   hydrocortisone  cream 1 % Apply 1 Application topically See admin instructions. Apply to urogenital area topically at bedtime every other day for itchy  areas related to vaginitis.   ipratropium-albuterol  (DUONEB) 0.5-2.5 (3) MG/3ML SOLN Inhale 3 mLs into the lungs every 8 (eight) hours. And every 8 hours as needed per [facility]   ketoconazole (NIZORAL) 2 % cream Apply 1 Application topically 2 (two) times daily. Apply to peri area   ketotifen  (ZADITOR ) 0.035 % ophthalmic solution Place 1 drop into both eyes 2 (two) times daily.   levothyroxine  (SYNTHROID ) 100 MCG tablet Take 100 mcg by mouth daily.   loperamide (IMODIUM) 2 MG capsule Take 2 mg by mouth every 4 (four) hours as needed for diarrhea or loose stools.   LORazepam  (ATIVAN ) 1 MG tablet Take 1 tablet (1 mg total) by mouth every 6 (six) hours as needed for anxiety.   methocarbamol  (ROBAXIN ) 500 MG tablet Take 500 mg by mouth every 6 (six) hours as needed for muscle spasms.   morphine  20 MG/5ML solution give 0.25 ml by mouth every 4 hours as needed for Pain - Severe   nystatin (MYCOSTATIN/NYSTOP) powder Apply 1 application  topically in the morning, at noon, in the evening, and at bedtime. To genital areas and inner thigh   ondansetron  (ZOFRAN ) 4 MG tablet Take 4 mg by mouth every 8 (eight) hours as needed for nausea or vomiting.   pantoprazole  (PROTONIX ) 40 MG tablet Take 40 mg by mouth daily.   potassium chloride  (KLOR-CON  M) 10 MEQ tablet Take 30 mEq by mouth daily.   traMADol (ULTRAM) 50 MG tablet Take 50 mg by mouth every 4 (four) hours as needed for moderate pain (pain score 4-6) or severe pain (pain score 7-10).   TRELEGY ELLIPTA 100-62.5-25 MCG/ACT AEPB Inhale 1 puff into the lungs daily.   triamcinolone cream (KENALOG) 0.1 % Apply 1 Application topically 2 (two) times daily. Apply to genital area   triamcinolone cream (KENALOG) 0.5 % Apply 1 Application topically at bedtime. Apply to lower back skin folds rash   No facility-administered encounter medications on file as of 05/17/2024.    Review of Systems  Constitutional:  Positive for fatigue.  HENT: Negative.  Respiratory:  Positive for shortness of breath.   Cardiovascular:  Positive for leg swelling.  Endocrine: Negative.   Musculoskeletal:  Positive for arthralgias, back pain and gait problem.  Psychiatric/Behavioral:  Positive for sleep disturbance.     There were no vitals filed for this visit. There is no height or weight on file to calculate BMI. Physical Exam Vitals and nursing note reviewed.  Constitutional:      Appearance: She is obese.  HENT:     Mouth/Throat:     Mouth: Mucous membranes are moist.  Cardiovascular:     Rate and Rhythm: Normal rate and regular rhythm.  Pulmonary:     Effort: Pulmonary effort is normal.     Breath sounds: Normal breath sounds.  Musculoskeletal:     Left lower leg: Edema present.  Skin:    General: Skin is warm and dry.  Neurological:     General: No focal deficit present.     Mental Status: She is oriented to person, place, and time.     Labs reviewed: Basic Metabolic Panel: Recent Labs    12/15/23 0507 12/16/23 0650 12/17/23 0532 12/26/23 0000 12/29/23 0000 01/02/24 0000 05/07/24 0000  NA 139 137 139   < > 136* 135* 125*  K 3.3* 3.6 3.6   < > 4.6 4.5 5.8*  CL 113* 112* 110   < > 109* 108 99  CO2 17* 15* 18*   < > 15 19 17   GLUCOSE 73 70 63*  --   --   --   --   BUN 24* 25* 24*   < > 42* 38* 32*  CREATININE 2.24* 2.45* 2.34*   < > 2.1* 2.3* 2.3*  CALCIUM 8.7* 8.5* 8.5*   < > 8.7 8.4* 8.6*  MG  --  2.1 2.0  --   --   --   --    < > = values in this interval not displayed.   Liver Function Tests: Recent Labs    12/10/23 1822 12/16/23 0650 12/17/23 0532 12/29/23 0000 01/02/24 0000  AST 14* 11* 10* 13 10*  ALT 15 11 10 9 8   ALKPHOS 111 82 76 119 105  BILITOT 0.5 0.5 0.4  --   --   PROT 7.1 6.2* 5.8*  --   --   ALBUMIN 3.5 3.0* 2.7* 3.4* 2.6*   No results for input(s): LIPASE, AMYLASE in the last 8760 hours. No results for input(s): AMMONIA in the last 8760 hours. CBC: Recent Labs    12/15/23 0507  12/16/23 0650 12/17/23 0532 12/29/23 0000 01/02/24 0000  WBC 9.4 8.3 9.4 13.4 10.2  NEUTROABS  --  5.3 6.3  --  8,038.00  HGB 10.6* 10.9* 9.9* 11.1* 9.6*  HCT 36.6 37.6 32.8* 35* 31*  MCV 92.7 94.0 92.4  --   --   PLT 171 183 198 239 241   Cardiac Enzymes: No results for input(s): CKTOTAL, CKMB, CKMBINDEX, TROPONINI in the last 8760 hours. BNP: Invalid input(s): POCBNP No results found for: HGBA1C Lab Results  Component Value Date   TSH 5.752 (H) 12/16/2023   Lab Results  Component Value Date   VITAMINB12 216 12/17/2023   Lab Results  Component Value Date   FOLATE 6.8 12/17/2023   Lab Results  Component Value Date   IRON 61 12/17/2023   TIBC 249 (L) 12/17/2023   FERRITIN 155 12/17/2023    Imaging and Procedures obtained prior to SNF admission: CT CHEST ABDOMEN PELVIS WO CONTRAST Result  Date: 12/10/2023 CLINICAL DATA:  Sepsis possible UTI EXAM: CT CHEST, ABDOMEN AND PELVIS WITHOUT CONTRAST TECHNIQUE: Multidetector CT imaging of the chest, abdomen and pelvis was performed following the standard protocol without IV contrast. RADIATION DOSE REDUCTION: This exam was performed according to the departmental dose-optimization program which includes automated exposure control, adjustment of the mA and/or kV according to patient size and/or use of iterative reconstruction technique. COMPARISON:  Chest x-ray 12/10/2023, CT 01/10/2023, chest CT 07/02/2022, hip CT 07/11/2022, 06/08/2021 FINDINGS: CT CHEST FINDINGS Cardiovascular: Limited without intravenous contrast. Mild aortic atherosclerosis. No aneurysm. Multi vessel coronary vascular calcification. Cardiomegaly. No pericardial effusion. Mediastinum/Nodes: Patent trachea no thyroid  mass. No suspicious lymph nodes. Moderate large hiatal hernia. Lungs/Pleura: Mild diffuse bilateral mosaic density likely due to airways disease. There is diffuse bilateral bronchial wall thickening with narrowed appearance of airways. Patchy  mucous plugging most evident in the lower lobes. Scattered small foci of irregular consolidation and nodularity within the bilateral lower lobes. Bandlike scarring or atelectasis in both lower lobes. No sizable pleural effusion Musculoskeletal: Sternum appears intact. Multilevel degenerative changes. No acute osseous abnormality. CT ABDOMEN PELVIS FINDINGS Hepatobiliary: No focal liver abnormality is seen. Status post cholecystectomy. No biliary dilatation. Pancreas: Unremarkable. No pancreatic ductal dilatation or surrounding inflammatory changes. Spleen: Normal in size without focal abnormality. Adrenals/Urinary Tract: Adrenal glands are normal. Kidneys show no hydronephrosis. Foley catheter in the bladder which is decompressed. Right kidney appears slightly atrophic. Stomach/Bowel: Stomach within normal limits aside from hiatal hernia which contains probable pill fragments. No dilated small bowel. Diverticular disease of the colon. Difficult to exclude subtle fat stranding and minimal wall thickening at the descending/proximal sigmoid colon, series 2, image 95. Negative appendix. Vascular/Lymphatic: Aortic atherosclerosis. No enlarged abdominal or pelvic lymph nodes. Reproductive: Uterus and bilateral adnexa are unremarkable. Other: Negative for pelvic effusion or free air. Musculoskeletal: Chronic superior endplate deformities at L1 and L3. suspect transitional anatomy at lumbosacral junction with lumbarized S1 segment. Spinal hardware at the spinous processes of the lower lumbar spine.1 no acute osseous abnormality. Chronic deformity of right hip with evidence of prior right hip hardware removal and chronic mixed soft tissue and fluid density at the right hip socket. Chronic erosive and remodeling change of the right acetabulum. IMPRESSION: 1. Diffuse bilateral bronchial wall thickening with narrowed appearance of airways consistent with airways inflammatory process and patchy mucous plugging most evident in  the lower lobes. Scattered small foci of irregular consolidation and nodularity within the bilateral lower lobes, suspect for multifocal pneumonia or potential aspiration. 2. Diverticular disease of the colon. Difficult to exclude subtle fat stranding and minimal wall thickening at the descending/proximal sigmoid colon, as may be seen with mild diverticulitis. 3. Other chronic findings as described above 4. Aortic atherosclerosis. Aortic Atherosclerosis (ICD10-I70.0). Electronically Signed   By: Luke Bun M.D.   On: 12/10/2023 21:42   DG Chest Port 1 View Result Date: 12/10/2023 CLINICAL DATA:  Insert epic diagnosis and indication. EXAM: PORTABLE CHEST 1 VIEW COMPARISON:  Radiograph 10/13/2022 FINDINGS: Patient is chronically rotated. Stable heart size and mediastinal contours. No focal airspace disease. No pleural effusion or pneumothorax. No acute osseous findings. Chronic shoulder arthropathy. IMPRESSION: No acute chest findings. Electronically Signed   By: Andrea Gasman M.D.   On: 12/10/2023 18:55    Assessment/Plan Assessment & Plan Adult failure to thrive This dx not related to weight loss, but rather to other debilities Chronic wound Continues to be packed; intermittently infected Class 3 obesity (HCC) BMI 45.6; no hope of  losing weight Gait disorder  CKD (chronic kidney disease) stage 4, GFR 15-29 ml/min (HCC) Stable BUN/Cr; now on higher dose Lasix  for edema Hypothyroidism, unspecified type TSH now in good range ; has had fluctuation s in past  Family/ staff Communication:   Labs/tests ordered:  .smmsig     [1]  Allergies Allergen Reactions   Ciprofloxacin  Other (See Comments)    Unknown    Keflex [Cephalexin] Diarrhea   Macrobid [Nitrofurantoin] Hives   Amoxicillin Rash    Has since tolerated Zosyn  inpatient   Cefadroxil  Rash    Allergy not listed on MAR    "

## 2024-05-17 NOTE — Assessment & Plan Note (Addendum)
 TSH now in good range ; has had fluctuation s in past

## 2024-05-17 NOTE — Assessment & Plan Note (Addendum)
 BMI 45.6; no hope of losing weight

## 2024-05-17 NOTE — Assessment & Plan Note (Addendum)
 This dx not related to weight loss, but rather to other debilities

## 2024-05-17 NOTE — Assessment & Plan Note (Addendum)
 Continues to be packed; intermittently infected

## 2024-05-17 NOTE — Assessment & Plan Note (Addendum)
 Stable BUN/Cr; now on higher dose Lasix  for edema

## 2024-05-29 ENCOUNTER — Encounter: Payer: Self-pay | Admitting: Nurse Practitioner

## 2024-05-29 ENCOUNTER — Non-Acute Institutional Stay (SKILLED_NURSING_FACILITY): Payer: Self-pay | Admitting: Nurse Practitioner

## 2024-05-29 DIAGNOSIS — R627 Adult failure to thrive: Secondary | ICD-10-CM

## 2024-05-29 DIAGNOSIS — F419 Anxiety disorder, unspecified: Secondary | ICD-10-CM

## 2024-05-29 DIAGNOSIS — I5032 Chronic diastolic (congestive) heart failure: Secondary | ICD-10-CM

## 2024-05-29 DIAGNOSIS — N184 Chronic kidney disease, stage 4 (severe): Secondary | ICD-10-CM

## 2024-05-29 NOTE — Assessment & Plan Note (Signed)
 CHF fluid overloaded, increased Furosemide  to help the patient's SOB, off Kcl

## 2024-05-29 NOTE — Progress Notes (Unsigned)
 " Location:  [facility] Room Number: [phone] A Place of Service:  SNF (31) Provider:  Hilma Steinhilber X, NP  Patient Care Team: Neshawn Aird X, NP as PCP - General (Internal Medicine) [name], MD as PCP - Cardiology (Cardiology) [name], MD as PCP - Sleep Medicine (Cardiology) [name], MD (Sports Medicine) [name], MD (Ophthalmology) [name], [name], MD (Dermatology) [name], MD (Gastroenterology) [name], [name], MD (Nephrology) [name], DC as Referring Physician (Chiropractic Medicine)  Extended Emergency Contact Information Primary Emergency Contact: Woodin,[name] Address: [address]          [place], [place] [address] [place] Home Phone: [phone] Mobile Phone: [phone] Relation: Spouse  Code Status:  DNR Goals of care: Advanced Directive information    03/26/2024    9:38 AM  Advanced Directives  Does Patient Have a Medical Advance Directive? Yes  Type of Advance Directive Living will;Out of facility DNR (pink MOST or yellow form)  Does patient want to make changes to medical advance directive? No - Patient declined  Pre-existing out of facility DNR order (yellow form or pink MOST form) Pink MOST form placed in chart (order not valid for inpatient use);Yellow form placed in chart (order not valid for inpatient use)     Chief Complaint  Patient presents with   Routine Visit    HPI:  Pt is a 84 y.o. female seen today for medical management of chronic diseases.    Anxiety/restlessness, prn Lorazepam , Morphine  available to her.              O2 2 L/min via nasal cannula for hypoxia, noted severe erythematous from the waist down/R hip region, hx of infected prosthetic hip, resulted absent hip, and subsequent abscess/cellulitis, moist rales in lungs, allergic to antibiotics.              CHF fluid overloaded, increased Furosemide  to help the patient's SOB, off Kcl               05/07/2024 NA 125, potassium 5.8, BUN 32, creatinine 2.3.  Urine culture culture showed mixed genital flora             Adult failure to thrive, under Hospice service, comfort measures.              GERD, taking Pantoprazole                 Past Medical History:  Diagnosis Date   Benign hypertension with chronic kidney disease, stage III (HCC)    Overview:  Last Assessment & Plan:  Usually the patient is hypertensive. However today her blood pressure is 105 systolic. She has been feeling fatigued with some lightheadedness. Part of this may be related to her lower blood pressure. Her pressure may be improved with her decrease in salt and fluid intake. I've instructed her to put her lisinopril/hctz on hold. 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 It was possibly reentrant. She does well with diltiazem . No change in therapy.   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   furosemide  (LASIX ) 20 MG tablet Take 40 mg by mouth daily.   hydrocortisone  cream 1 % Apply 1 Application topically See admin instructions. Apply to urogenital area topically at bedtime every other day for itchy areas related to vaginitis.   ipratropium-albuterol  (DUONEB) 0.5-2.5 (3) MG/3ML SOLN Inhale 3 mLs into the lungs every 6 (six) hours. 1 vial inhale orally every 6 hours for SOB While awake   1 vial inhale orally every 24 hours as needed for Wheezing related to PNEUMONIA, UNSPECIFIED ORGANISM (J18.9); ACUTE PULMONARY EDEMA (J81.0)   ketoconazole (NIZORAL) 2 % cream Apply 1 Application topically 2 (two) times daily. Apply to peri area   ketotifen  (ZADITOR ) 0.035 % ophthalmic solution Place 1 drop into both eyes 2 (two) times daily.   LORazepam  (ATIVAN ) 1 MG tablet Take 1 tablet (1 mg total) by mouth every 6 (six) hours as needed for anxiety.   morphine  20 MG/5ML solution give 0.25 ml by mouth every 6 hours as needed for Pain - Severe   nystatin (MYCOSTATIN/NYSTOP) powder Apply 1 application  topically in the morning, at noon, in the evening, and at bedtime. To genital areas and inner thigh   pantoprazole  (PROTONIX ) 40 MG tablet Take 40 mg by mouth daily.   TRELEGY ELLIPTA 100-62.5-25 MCG/ACT AEPB Inhale 1 puff into the lungs daily.   triamcinolone cream (KENALOG) 0.1 % Apply 1 Application topically 2 (two) times daily. Apply to genital area    triamcinolone cream (KENALOG) 0.5 % Apply 1 Application topically at bedtime. Apply to lower back skin folds rash   [DISCONTINUED] acetaminophen  (TYLENOL ) 325 MG tablet Take 650 mg by mouth every 4 (four) hours as needed for fever, moderate pain (pain score 4-6) or mild pain (pain score 1-3). (Patient not taking: Reported on 05/29/2024)   [DISCONTINUED] bifidobacterium infantis (ALIGN) capsule Take 1 capsule by mouth daily. (Patient not taking: Reported on 05/29/2024)   [DISCONTINUED] docusate sodium  (COLACE) 100 MG capsule Take 100 mg by mouth daily as needed for mild constipation or moderate constipation. (Patient not taking: Reported on 05/29/2024)   [DISCONTINUED] guaiFENesin -dextromethorphan (ROBITUSSIN DM) 100-10 MG/5ML syrup Take 10 mLs by mouth every 4 (four) hours as needed for cough. (  Patient not taking: Reported on 05/29/2024)   [DISCONTINUED] levothyroxine  (SYNTHROID ) 100 MCG tablet Take 100 mcg by mouth daily. (Patient not taking: Reported on 05/29/2024)   [DISCONTINUED] loperamide (IMODIUM) 2 MG capsule Take 2 mg by mouth every 4 (four) hours as needed for diarrhea or loose stools. (Patient not taking: Reported on 05/29/2024)   [DISCONTINUED] methocarbamol  (ROBAXIN ) 500 MG tablet Take 500 mg by mouth every 6 (six) hours as needed for muscle spasms. (Patient not taking: Reported on 05/29/2024)   [DISCONTINUED] ondansetron  (ZOFRAN ) 4 MG tablet Take 4 mg by mouth every 8 (eight) hours as needed for nausea or vomiting. (Patient not taking: Reported on 05/29/2024)   [DISCONTINUED] potassium chloride  (KLOR-CON  M) 10 MEQ tablet Take 30 mEq by mouth daily. (Patient not taking: Reported on 05/29/2024)   [DISCONTINUED] traMADol (ULTRAM) 50 MG tablet Take 50 mg by mouth every 4 (four) hours as needed for moderate pain (pain score 4-6) or severe pain (pain score 7-10). (Patient not taking: Reported on 05/29/2024)   No facility-administered encounter medications on file as of 05/29/2024.    Review of Systems   Constitutional:  Positive for appetite change and fatigue. Negative for fever.  HENT:  Negative for congestion, sore throat and trouble swallowing.   Eyes:  Negative for visual disturbance.  Respiratory:  Positive for cough. Negative for shortness of breath and wheezing.   Cardiovascular:  Positive for leg swelling.  Gastrointestinal:  Negative for constipation and vomiting.  Genitourinary:  Positive for frequency. Negative for dysuria and urgency.  Musculoskeletal:  Positive for arthralgias, back pain and gait problem.       Mid to lower back pain, worsened, positional Lower back pain  Skin:  Positive for wound. Negative for color change.  Neurological:  Negative for speech difficulty and weakness.  Psychiatric/Behavioral:  The patient is nervous/anxious.     Immunization History  Administered Date(s) Administered   Fluzone Influenza virus vaccine,trivalent (IIV3), split virus 02/20/2012, 02/11/2013, 02/02/2016, 01/11/2017, 02/04/2019   INFLUENZA, HIGH DOSE SEASONAL PF 02/04/2015, 01/11/2017, 01/30/2018, 02/05/2020, 01/25/2023   Influenza Split 01/28/2008, 02/17/2009   Influenza-Unspecified 02/16/2022, 01/30/2024   Moderna Covid-19 Vaccine Bivalent Booster 63yrs & up 04/13/2023   Moderna SARS-COV2 Booster Vaccination 02/24/2022   Moderna Sars-Covid-2 Vaccination 04/29/2019, 05/09/2019, 05/27/2019, 03/03/2020, 09/04/2020, 11/04/2020   PNEUMOCOCCAL CONJUGATE-20 03/07/2024   PPD Test 02/05/2021, 02/19/2021, 07/14/2022, 10/19/2022   Pfizer Covid-19 Vaccine Bivalent Booster 31yrs & up 02/09/2021   Pneumococcal Conjugate-13 08/30/2013   Pneumococcal Polysaccharide-23 11/03/2005   RSV,unspecified 04/22/2022   Td 08/01/2001, 09/05/2019   Td (Adult) 08/01/2001   Tdap 09/22/2010, 11/24/2020   Unspecified SARS-COV-2 Vaccination 02/19/2024   Zoster Recombinant(Shingrix) 11/08/2016, 01/11/2017   Zoster, Live 11/04/2005, 11/08/2016, 01/11/2017   Zoster, Unspecified  01/11/2017   Pertinent  Health Maintenance Due  Topic Date Due   Influenza Vaccine  Completed   Bone Density Scan  Completed      04/15/2022   10:15 AM 04/27/2022   11:35 AM 05/31/2022    9:34 AM 07/26/2022    9:31 AM 10/21/2022    9:31 AM  Fall Risk  Falls in the past year? 0 0 0 0 0  Was there an injury with Fall? 0  0  0  0  0   Fall Risk Category Calculator 0 0 0 0 0  Fall Risk Category (Retired) Low  Low      (RETIRED) Patient Fall Risk Level Moderate fall risk  Moderate fall risk      Patient at Risk for  Falls Due to History of fall(s) History of fall(s) History of fall(s)  No Fall Risks  Fall risk Follow up Falls evaluation completed  Falls evaluation completed  Falls evaluation completed  Falls evaluation completed     Data saved with a previous flowsheet row definition   Functional Status Survey:    Vitals:   05/29/24 0859  BP: 111/68  Pulse: 66  Resp: 20  Temp: 97.6 F (36.4 C)  SpO2: 95%  Weight: 295 lb 6.4 oz (134 kg)  Height: 5' 8 (1.727 m)   Body mass index is 44.92 kg/m. Physical Exam Vitals and nursing note reviewed.  Constitutional:      Appearance: She is obese.     Comments: Anxious, repetitive: Gave me something to calm me down  HENT:     Head: Normocephalic and atraumatic.     Nose: Nose normal.  Eyes:     Extraocular Movements: Extraocular movements intact.     Conjunctiva/sclera: Conjunctivae normal.     Pupils: Pupils are equal, round, and reactive to light.  Cardiovascular:     Rate and Rhythm: Normal rate and regular rhythm.     Heart sounds: No murmur heard. Pulmonary:     Breath sounds: No wheezing, rhonchi or rales.  Abdominal:     General: Bowel sounds are normal.     Palpations: Abdomen is soft.     Tenderness: There is no abdominal tenderness.  Musculoskeletal:        General: Tenderness present.     Cervical back: Normal range of motion and neck supple.     Right lower leg: Edema present.     Left lower leg: Edema present.      Comments: R hip, prosthetic hip removed. Lower back pain i no spine ns positional, no spinous process tenderness palpated, pain is positional edematous from waist down  Skin:    General: Skin is warm and dry.     Findings: Erythema present.     Comments: Edema mostly in the dependent areas, buttock, thighs, legs.  More erythema, swelling, mild warmth in the right hip/lateral thigh area, a small open tunneling wound R hip with purulent drainage.  MOST no ABT/IVF  Neurological:     General: No focal deficit present.     Mental Status: She is alert and oriented to person, place, and time.     Gait: Gait abnormal.  Psychiatric:     Comments: Anxious-better     Labs reviewed: Recent Labs    12/15/23 0507 12/16/23 0650 12/17/23 0532 12/26/23 0000 12/29/23 0000 01/02/24 0000 05/07/24 0000  NA 139 137 139   < > 136* 135* 125*  K 3.3* 3.6 3.6   < > 4.6 4.5 5.8*  CL 113* 112* 110   < > 109* 108 99  CO2 17* 15* 18*   < > 15 19 17   GLUCOSE 73 70 63*  --   --   --   --   BUN 24* 25* 24*   < > 42* 38* 32*  CREATININE 2.24* 2.45* 2.34*   < > 2.1* 2.3* 2.3*  CALCIUM 8.7* 8.5* 8.5*   < > 8.7 8.4* 8.6*  MG  --  2.1 2.0  --   --   --   --    < > = values in this interval not displayed.   Recent Labs    12/10/23 1822 12/16/23 0650 12/17/23 0532 12/29/23 0000 01/02/24 0000  AST 14* 11* 10* 13 10*  ALT 15 11 10 9 8   ALKPHOS 111 82 76 119 105  BILITOT 0.5 0.5 0.4  --   --   PROT 7.1 6.2* 5.8*  --   --   ALBUMIN 3.5 3.0* 2.7* 3.4* 2.6*   Recent Labs    12/15/23 0507 12/16/23 0650 12/17/23 0532 12/29/23 0000 01/02/24 0000  WBC 9.4 8.3 9.4 13.4 10.2  NEUTROABS  --  5.3 6.3  --  8,038.00  HGB 10.6* 10.9* 9.9* 11.1* 9.6*  HCT 36.6 37.6 32.8* 35* 31*  MCV 92.7 94.0 92.4  --   --   PLT 171 183 198 239 241   Lab Results  Component Value Date   TSH 5.752 (H) 12/16/2023   No results found for: HGBA1C No results found for: CHOL, HDL, LDLCALC, LDLDIRECT, TRIG,  CHOLHDL  Significant Diagnostic Results in last 30 days:  No results found.  Assessment/Plan Failure to thrive in adult under Hospice service, comfort measures.  Persisted cellulitis/wound R hip, CHF, CKD, anxiety, cognitive impairment, ADL dependency  Anxiety  prn Lorazepam , Morphine  available to her.   Chronic heart failure with preserved ejection fraction (HFpEF) (HCC) CHF fluid overloaded, increased Furosemide  to help the patient's SOB, off Kcl   CKD (chronic kidney disease) stage 4, GFR 15-29 ml/min (HCC) 05/07/2024 NA 125, potassium 5.8, BUN 32, creatinine 2.3     Family/ staff Communication: plan of care reviewed with the patient and charge nurse.   Labs/tests ordered:  none         [1] Allergies Allergen Reactions   Ciprofloxacin  Other (See Comments)    Unknown    Keflex [Cephalexin] Diarrhea   Macrobid [Nitrofurantoin] Hives   Amoxicillin Rash    Has since tolerated Zosyn  inpatient   Cefadroxil  Rash    Allergy not listed on MAR   "

## 2024-05-29 NOTE — Assessment & Plan Note (Signed)
 05/07/2024 NA 125, potassium 5.8, BUN 32, creatinine 2.3

## 2024-05-29 NOTE — Assessment & Plan Note (Signed)
 Stable, taking Pantoprazole

## 2024-05-29 NOTE — Assessment & Plan Note (Signed)
"   prn Lorazepam , Morphine  available to her.  "

## 2024-05-29 NOTE — Assessment & Plan Note (Signed)
 under Hospice service, comfort measures.  Persisted cellulitis/wound R hip, CHF, CKD, anxiety, cognitive impairment, ADL dependency

## 2024-05-30 ENCOUNTER — Encounter: Payer: Self-pay | Admitting: Nurse Practitioner
